# Patient Record
Sex: Female | Born: 1935 | Race: White | Hispanic: No | Smoking: Never smoker
Health system: Southern US, Community
[De-identification: ages and names within clinical notes are randomized; demographics above are authoritative.]

## PROBLEM LIST (undated history)

## (undated) DIAGNOSIS — K439 Ventral hernia without obstruction or gangrene: Secondary | ICD-10-CM

## (undated) DIAGNOSIS — H3530 Unspecified macular degeneration: Secondary | ICD-10-CM

## (undated) DIAGNOSIS — K219 Gastro-esophageal reflux disease without esophagitis: Secondary | ICD-10-CM

## (undated) DIAGNOSIS — M069 Rheumatoid arthritis, unspecified: Secondary | ICD-10-CM

## (undated) DIAGNOSIS — G629 Polyneuropathy, unspecified: Secondary | ICD-10-CM

## (undated) DIAGNOSIS — M24459 Recurrent dislocation, unspecified hip: Secondary | ICD-10-CM

## (undated) DIAGNOSIS — M359 Systemic involvement of connective tissue, unspecified: Secondary | ICD-10-CM

## (undated) DIAGNOSIS — Z8719 Personal history of other diseases of the digestive system: Secondary | ICD-10-CM

## (undated) DIAGNOSIS — M48061 Spinal stenosis, lumbar region without neurogenic claudication: Secondary | ICD-10-CM

## (undated) DIAGNOSIS — G20A1 Parkinson's disease without dyskinesia, without mention of fluctuations: Secondary | ICD-10-CM

## (undated) DIAGNOSIS — C801 Malignant (primary) neoplasm, unspecified: Secondary | ICD-10-CM

## (undated) DIAGNOSIS — K760 Fatty (change of) liver, not elsewhere classified: Secondary | ICD-10-CM

## (undated) DIAGNOSIS — Z789 Other specified health status: Secondary | ICD-10-CM

## (undated) DIAGNOSIS — C50919 Malignant neoplasm of unspecified site of unspecified female breast: Secondary | ICD-10-CM

## (undated) DIAGNOSIS — N183 Chronic kidney disease, stage 3 unspecified: Secondary | ICD-10-CM

## (undated) DIAGNOSIS — M109 Gout, unspecified: Secondary | ICD-10-CM

## (undated) DIAGNOSIS — I10 Essential (primary) hypertension: Secondary | ICD-10-CM

## (undated) DIAGNOSIS — I5189 Other ill-defined heart diseases: Secondary | ICD-10-CM

## (undated) DIAGNOSIS — I700 Atherosclerosis of aorta: Secondary | ICD-10-CM

## (undated) DIAGNOSIS — I6789 Other cerebrovascular disease: Secondary | ICD-10-CM

## (undated) DIAGNOSIS — E785 Hyperlipidemia, unspecified: Secondary | ICD-10-CM

## (undated) DIAGNOSIS — F32A Depression, unspecified: Secondary | ICD-10-CM

## (undated) DIAGNOSIS — G20 Parkinson's disease: Secondary | ICD-10-CM

## (undated) DIAGNOSIS — E538 Deficiency of other specified B group vitamins: Secondary | ICD-10-CM

## (undated) DIAGNOSIS — Z96643 Presence of artificial hip joint, bilateral: Secondary | ICD-10-CM

## (undated) DIAGNOSIS — H9190 Unspecified hearing loss, unspecified ear: Secondary | ICD-10-CM

## (undated) DIAGNOSIS — M549 Dorsalgia, unspecified: Secondary | ICD-10-CM

## (undated) DIAGNOSIS — Z923 Personal history of irradiation: Secondary | ICD-10-CM

## (undated) DIAGNOSIS — H409 Unspecified glaucoma: Secondary | ICD-10-CM

## (undated) HISTORY — PX: CATARACT EXTRACTION: SUR2

## (undated) HISTORY — DX: Gout, unspecified: M10.9

## (undated) HISTORY — PX: JOINT REPLACEMENT: SHX530

## (undated) HISTORY — DX: Parkinson's disease without dyskinesia, without mention of fluctuations: G20.A1

## (undated) HISTORY — DX: Dorsalgia, unspecified: M54.9

## (undated) HISTORY — PX: EYE SURGERY: SHX253

## (undated) HISTORY — PX: HAND SURGERY: SHX662

## (undated) HISTORY — PX: ABDOMINAL HYSTERECTOMY: SHX81

## (undated) HISTORY — PX: TOTAL KNEE ARTHROPLASTY: SHX125

## (undated) HISTORY — DX: Essential (primary) hypertension: I10

## (undated) HISTORY — DX: Parkinson's disease: G20

## (undated) HISTORY — DX: Unspecified hearing loss, unspecified ear: H91.90

## (undated) HISTORY — PX: HIP SURGERY: SHX245

## (undated) HISTORY — PX: OOPHORECTOMY: SHX86

## (undated) HISTORY — DX: Malignant (primary) neoplasm, unspecified: C80.1

---

## 1999-06-09 ENCOUNTER — Other Ambulatory Visit: Admission: RE | Admit: 1999-06-09 | Discharge: 1999-06-09 | Payer: Self-pay

## 2004-06-30 ENCOUNTER — Other Ambulatory Visit: Payer: Self-pay

## 2004-07-22 ENCOUNTER — Ambulatory Visit: Payer: Self-pay | Admitting: Pain Medicine

## 2004-07-28 ENCOUNTER — Inpatient Hospital Stay: Payer: Self-pay | Admitting: Unknown Physician Specialty

## 2004-07-28 ENCOUNTER — Other Ambulatory Visit: Payer: Self-pay

## 2004-08-19 ENCOUNTER — Other Ambulatory Visit: Payer: Self-pay

## 2004-09-02 ENCOUNTER — Inpatient Hospital Stay: Payer: Self-pay | Admitting: General Practice

## 2004-09-06 ENCOUNTER — Encounter: Payer: Self-pay | Admitting: Internal Medicine

## 2004-09-17 ENCOUNTER — Encounter: Payer: Self-pay | Admitting: Internal Medicine

## 2004-10-18 ENCOUNTER — Encounter: Payer: Self-pay | Admitting: Internal Medicine

## 2004-10-26 ENCOUNTER — Ambulatory Visit: Payer: Self-pay | Admitting: Internal Medicine

## 2004-11-10 ENCOUNTER — Ambulatory Visit: Payer: Self-pay | Admitting: Pain Medicine

## 2004-11-16 ENCOUNTER — Ambulatory Visit: Payer: Self-pay | Admitting: Pain Medicine

## 2004-11-18 ENCOUNTER — Encounter: Payer: Self-pay | Admitting: Internal Medicine

## 2004-12-16 ENCOUNTER — Encounter: Payer: Self-pay | Admitting: Internal Medicine

## 2005-01-12 ENCOUNTER — Ambulatory Visit: Payer: Self-pay | Admitting: Pain Medicine

## 2005-01-16 ENCOUNTER — Encounter: Payer: Self-pay | Admitting: Internal Medicine

## 2005-01-20 ENCOUNTER — Ambulatory Visit: Payer: Self-pay | Admitting: Pain Medicine

## 2005-02-08 ENCOUNTER — Ambulatory Visit: Payer: Self-pay | Admitting: Pain Medicine

## 2005-02-15 ENCOUNTER — Encounter: Payer: Self-pay | Admitting: Internal Medicine

## 2005-03-18 ENCOUNTER — Encounter: Payer: Self-pay | Admitting: Internal Medicine

## 2005-03-30 ENCOUNTER — Ambulatory Visit: Payer: Self-pay | Admitting: Gastroenterology

## 2005-03-31 ENCOUNTER — Ambulatory Visit: Payer: Self-pay | Admitting: Gastroenterology

## 2005-04-08 ENCOUNTER — Ambulatory Visit: Payer: Self-pay | Admitting: Pain Medicine

## 2005-04-14 ENCOUNTER — Ambulatory Visit: Payer: Self-pay | Admitting: Pain Medicine

## 2005-05-04 ENCOUNTER — Ambulatory Visit: Payer: Self-pay | Admitting: Pain Medicine

## 2005-05-24 ENCOUNTER — Ambulatory Visit: Payer: Self-pay | Admitting: Pain Medicine

## 2005-06-03 ENCOUNTER — Ambulatory Visit: Payer: Self-pay | Admitting: Pain Medicine

## 2005-06-07 ENCOUNTER — Ambulatory Visit: Payer: Self-pay | Admitting: Pain Medicine

## 2005-06-09 ENCOUNTER — Ambulatory Visit: Payer: Self-pay | Admitting: Pain Medicine

## 2005-07-13 ENCOUNTER — Ambulatory Visit: Payer: Self-pay | Admitting: Pain Medicine

## 2005-07-21 ENCOUNTER — Ambulatory Visit: Payer: Self-pay | Admitting: Pain Medicine

## 2005-08-17 ENCOUNTER — Ambulatory Visit: Payer: Self-pay | Admitting: Pain Medicine

## 2005-08-25 ENCOUNTER — Ambulatory Visit: Payer: Self-pay | Admitting: Unknown Physician Specialty

## 2005-09-16 ENCOUNTER — Ambulatory Visit: Payer: Self-pay | Admitting: Pain Medicine

## 2005-10-06 ENCOUNTER — Ambulatory Visit: Payer: Self-pay | Admitting: Pain Medicine

## 2005-10-18 DIAGNOSIS — Z853 Personal history of malignant neoplasm of breast: Secondary | ICD-10-CM | POA: Insufficient documentation

## 2005-10-18 DIAGNOSIS — Z923 Personal history of irradiation: Secondary | ICD-10-CM

## 2005-10-18 DIAGNOSIS — C50911 Malignant neoplasm of unspecified site of right female breast: Secondary | ICD-10-CM

## 2005-10-18 DIAGNOSIS — C801 Malignant (primary) neoplasm, unspecified: Secondary | ICD-10-CM

## 2005-10-18 DIAGNOSIS — C50919 Malignant neoplasm of unspecified site of unspecified female breast: Secondary | ICD-10-CM

## 2005-10-18 HISTORY — DX: Malignant (primary) neoplasm, unspecified: C80.1

## 2005-10-18 HISTORY — PX: BREAST LUMPECTOMY: SHX2

## 2005-10-18 HISTORY — DX: Personal history of irradiation: Z92.3

## 2005-10-18 HISTORY — DX: Malignant neoplasm of unspecified site of right female breast: C50.911

## 2005-10-18 HISTORY — DX: Malignant neoplasm of unspecified site of unspecified female breast: C50.919

## 2005-10-21 ENCOUNTER — Ambulatory Visit: Payer: Self-pay | Admitting: Pain Medicine

## 2005-11-03 ENCOUNTER — Ambulatory Visit: Payer: Self-pay | Admitting: Pain Medicine

## 2005-11-30 ENCOUNTER — Ambulatory Visit: Payer: Self-pay | Admitting: Pain Medicine

## 2005-12-23 ENCOUNTER — Ambulatory Visit: Payer: Self-pay | Admitting: Pain Medicine

## 2006-01-18 ENCOUNTER — Ambulatory Visit: Payer: Self-pay | Admitting: Pain Medicine

## 2006-02-10 ENCOUNTER — Ambulatory Visit: Payer: Self-pay | Admitting: Internal Medicine

## 2006-02-15 ENCOUNTER — Ambulatory Visit: Payer: Self-pay | Admitting: Pain Medicine

## 2006-02-24 ENCOUNTER — Ambulatory Visit: Payer: Self-pay | Admitting: Internal Medicine

## 2006-03-03 ENCOUNTER — Ambulatory Visit: Payer: Self-pay | Admitting: Surgery

## 2006-03-16 ENCOUNTER — Ambulatory Visit: Payer: Self-pay | Admitting: Surgery

## 2006-03-24 ENCOUNTER — Ambulatory Visit: Payer: Self-pay | Admitting: Pain Medicine

## 2006-04-01 ENCOUNTER — Ambulatory Visit: Payer: Self-pay | Admitting: Oncology

## 2006-04-19 ENCOUNTER — Ambulatory Visit: Payer: Self-pay | Admitting: Pain Medicine

## 2006-04-25 ENCOUNTER — Ambulatory Visit: Payer: Self-pay | Admitting: Oncology

## 2006-05-12 ENCOUNTER — Ambulatory Visit: Payer: Self-pay | Admitting: Pain Medicine

## 2006-05-18 ENCOUNTER — Ambulatory Visit: Payer: Self-pay | Admitting: Oncology

## 2006-05-18 ENCOUNTER — Ambulatory Visit: Payer: Self-pay | Admitting: Pain Medicine

## 2006-06-14 ENCOUNTER — Ambulatory Visit: Payer: Self-pay | Admitting: Pain Medicine

## 2006-06-21 ENCOUNTER — Ambulatory Visit: Payer: Self-pay | Admitting: Oncology

## 2006-07-12 ENCOUNTER — Ambulatory Visit: Payer: Self-pay | Admitting: Pain Medicine

## 2006-07-18 ENCOUNTER — Ambulatory Visit: Payer: Self-pay | Admitting: Oncology

## 2006-08-16 ENCOUNTER — Ambulatory Visit: Payer: Self-pay | Admitting: Pain Medicine

## 2006-08-24 ENCOUNTER — Ambulatory Visit: Payer: Self-pay | Admitting: Pain Medicine

## 2006-09-15 ENCOUNTER — Ambulatory Visit: Payer: Self-pay | Admitting: Pain Medicine

## 2006-09-21 ENCOUNTER — Ambulatory Visit: Payer: Self-pay | Admitting: Pain Medicine

## 2006-09-28 ENCOUNTER — Ambulatory Visit: Payer: Self-pay | Admitting: Pain Medicine

## 2006-10-20 ENCOUNTER — Other Ambulatory Visit: Payer: Self-pay

## 2006-10-26 ENCOUNTER — Inpatient Hospital Stay: Payer: Self-pay | Admitting: General Practice

## 2006-11-14 ENCOUNTER — Encounter: Payer: Self-pay | Admitting: General Practice

## 2006-11-18 ENCOUNTER — Encounter: Payer: Self-pay | Admitting: General Practice

## 2006-11-25 ENCOUNTER — Ambulatory Visit: Payer: Self-pay | Admitting: Oncology

## 2006-12-15 ENCOUNTER — Ambulatory Visit: Payer: Self-pay | Admitting: Pain Medicine

## 2006-12-17 ENCOUNTER — Encounter: Payer: Self-pay | Admitting: General Practice

## 2007-01-16 ENCOUNTER — Ambulatory Visit: Payer: Self-pay | Admitting: Pain Medicine

## 2007-02-22 ENCOUNTER — Ambulatory Visit: Payer: Self-pay | Admitting: Oncology

## 2007-03-21 ENCOUNTER — Ambulatory Visit: Payer: Self-pay | Admitting: Pain Medicine

## 2007-03-29 ENCOUNTER — Ambulatory Visit: Payer: Self-pay | Admitting: Pain Medicine

## 2007-04-20 ENCOUNTER — Ambulatory Visit: Payer: Self-pay | Admitting: Pain Medicine

## 2007-05-18 ENCOUNTER — Ambulatory Visit: Payer: Self-pay | Admitting: Pain Medicine

## 2007-05-19 ENCOUNTER — Ambulatory Visit: Payer: Self-pay | Admitting: Oncology

## 2007-05-22 ENCOUNTER — Ambulatory Visit: Payer: Self-pay | Admitting: Pain Medicine

## 2007-05-26 ENCOUNTER — Ambulatory Visit: Payer: Self-pay | Admitting: Oncology

## 2007-06-13 ENCOUNTER — Ambulatory Visit: Payer: Self-pay | Admitting: Pain Medicine

## 2007-06-19 ENCOUNTER — Ambulatory Visit: Payer: Self-pay | Admitting: Oncology

## 2007-07-19 ENCOUNTER — Ambulatory Visit: Payer: Self-pay | Admitting: Radiation Oncology

## 2007-08-21 ENCOUNTER — Ambulatory Visit: Payer: Self-pay | Admitting: Pain Medicine

## 2007-08-28 ENCOUNTER — Ambulatory Visit: Payer: Self-pay | Admitting: Pain Medicine

## 2007-09-01 ENCOUNTER — Ambulatory Visit: Payer: Self-pay | Admitting: Pain Medicine

## 2007-09-18 ENCOUNTER — Ambulatory Visit: Payer: Self-pay | Admitting: Pain Medicine

## 2007-10-17 ENCOUNTER — Ambulatory Visit: Payer: Self-pay | Admitting: Pain Medicine

## 2007-11-19 ENCOUNTER — Ambulatory Visit: Payer: Self-pay | Admitting: Oncology

## 2007-11-24 ENCOUNTER — Ambulatory Visit: Payer: Self-pay | Admitting: Oncology

## 2007-11-28 ENCOUNTER — Ambulatory Visit: Payer: Self-pay | Admitting: Pain Medicine

## 2007-12-17 ENCOUNTER — Ambulatory Visit: Payer: Self-pay | Admitting: Oncology

## 2007-12-26 ENCOUNTER — Ambulatory Visit: Payer: Self-pay | Admitting: Pain Medicine

## 2008-01-25 ENCOUNTER — Ambulatory Visit: Payer: Self-pay | Admitting: Pain Medicine

## 2008-02-26 ENCOUNTER — Ambulatory Visit: Payer: Self-pay | Admitting: Oncology

## 2008-02-28 ENCOUNTER — Ambulatory Visit: Payer: Self-pay | Admitting: Pain Medicine

## 2008-03-18 ENCOUNTER — Ambulatory Visit: Payer: Self-pay | Admitting: Oncology

## 2008-04-04 ENCOUNTER — Ambulatory Visit: Payer: Self-pay | Admitting: Pain Medicine

## 2008-04-10 ENCOUNTER — Ambulatory Visit: Payer: Self-pay | Admitting: Pain Medicine

## 2008-05-08 ENCOUNTER — Encounter: Payer: Self-pay | Admitting: Neurology

## 2008-05-09 ENCOUNTER — Ambulatory Visit: Payer: Self-pay | Admitting: Pain Medicine

## 2008-05-21 ENCOUNTER — Encounter: Payer: Self-pay | Admitting: Neurology

## 2008-05-29 ENCOUNTER — Other Ambulatory Visit: Payer: Self-pay

## 2008-05-29 ENCOUNTER — Ambulatory Visit: Payer: Self-pay | Admitting: Ophthalmology

## 2008-06-06 ENCOUNTER — Ambulatory Visit: Payer: Self-pay | Admitting: Pain Medicine

## 2008-06-11 ENCOUNTER — Ambulatory Visit: Payer: Self-pay | Admitting: Ophthalmology

## 2008-06-18 ENCOUNTER — Encounter: Payer: Self-pay | Admitting: Neurology

## 2008-07-04 ENCOUNTER — Ambulatory Visit: Payer: Self-pay | Admitting: Pain Medicine

## 2008-07-04 ENCOUNTER — Ambulatory Visit: Payer: Self-pay | Admitting: Ophthalmology

## 2008-07-09 ENCOUNTER — Ambulatory Visit: Payer: Self-pay | Admitting: Ophthalmology

## 2008-07-18 ENCOUNTER — Ambulatory Visit: Payer: Self-pay | Admitting: Oncology

## 2008-08-08 ENCOUNTER — Ambulatory Visit: Payer: Self-pay | Admitting: Pain Medicine

## 2008-08-13 ENCOUNTER — Inpatient Hospital Stay: Payer: Self-pay | Admitting: Specialist

## 2008-09-03 ENCOUNTER — Ambulatory Visit: Payer: Self-pay | Admitting: Pain Medicine

## 2008-09-26 ENCOUNTER — Ambulatory Visit: Payer: Self-pay | Admitting: Pain Medicine

## 2008-10-31 ENCOUNTER — Ambulatory Visit: Payer: Self-pay | Admitting: Pain Medicine

## 2008-11-28 ENCOUNTER — Ambulatory Visit: Payer: Self-pay | Admitting: Pain Medicine

## 2008-12-26 ENCOUNTER — Ambulatory Visit: Payer: Self-pay | Admitting: Pain Medicine

## 2009-01-30 ENCOUNTER — Ambulatory Visit: Payer: Self-pay | Admitting: Pain Medicine

## 2009-02-20 ENCOUNTER — Ambulatory Visit: Payer: Self-pay | Admitting: Pain Medicine

## 2009-02-26 ENCOUNTER — Ambulatory Visit: Payer: Self-pay | Admitting: Pain Medicine

## 2009-03-05 ENCOUNTER — Emergency Department: Payer: Self-pay | Admitting: Emergency Medicine

## 2009-03-18 ENCOUNTER — Ambulatory Visit: Payer: Self-pay | Admitting: Oncology

## 2009-03-27 ENCOUNTER — Ambulatory Visit: Payer: Self-pay | Admitting: Pain Medicine

## 2009-04-04 ENCOUNTER — Ambulatory Visit: Payer: Self-pay | Admitting: Oncology

## 2009-04-17 ENCOUNTER — Ambulatory Visit: Payer: Self-pay | Admitting: Oncology

## 2009-04-24 ENCOUNTER — Ambulatory Visit: Payer: Self-pay | Admitting: Pain Medicine

## 2009-05-05 ENCOUNTER — Ambulatory Visit: Payer: Self-pay | Admitting: Gastroenterology

## 2009-05-15 ENCOUNTER — Inpatient Hospital Stay: Payer: Self-pay | Admitting: General Practice

## 2009-05-22 ENCOUNTER — Ambulatory Visit: Payer: Self-pay | Admitting: Pain Medicine

## 2009-06-12 ENCOUNTER — Ambulatory Visit: Payer: Self-pay | Admitting: General Practice

## 2009-06-24 ENCOUNTER — Ambulatory Visit: Payer: Self-pay | Admitting: Pain Medicine

## 2009-06-25 ENCOUNTER — Inpatient Hospital Stay: Payer: Self-pay | Admitting: General Practice

## 2009-07-24 ENCOUNTER — Ambulatory Visit: Payer: Self-pay | Admitting: Pain Medicine

## 2009-08-21 ENCOUNTER — Ambulatory Visit: Payer: Self-pay | Admitting: Pain Medicine

## 2009-09-23 ENCOUNTER — Ambulatory Visit: Payer: Self-pay | Admitting: Pain Medicine

## 2009-11-03 ENCOUNTER — Ambulatory Visit: Payer: Self-pay | Admitting: Pain Medicine

## 2009-11-27 ENCOUNTER — Ambulatory Visit: Payer: Self-pay | Admitting: Pain Medicine

## 2010-01-13 ENCOUNTER — Ambulatory Visit: Payer: Self-pay | Admitting: Pain Medicine

## 2010-01-27 ENCOUNTER — Ambulatory Visit: Payer: Self-pay | Admitting: Pain Medicine

## 2010-03-03 ENCOUNTER — Ambulatory Visit: Payer: Self-pay | Admitting: Pain Medicine

## 2010-03-18 ENCOUNTER — Ambulatory Visit: Payer: Self-pay | Admitting: Pain Medicine

## 2010-04-02 ENCOUNTER — Ambulatory Visit: Payer: Self-pay | Admitting: Pain Medicine

## 2010-04-23 ENCOUNTER — Ambulatory Visit: Payer: Self-pay | Admitting: Internal Medicine

## 2010-04-30 ENCOUNTER — Ambulatory Visit: Payer: Self-pay | Admitting: Pain Medicine

## 2010-06-01 ENCOUNTER — Ambulatory Visit: Payer: Self-pay | Admitting: Pain Medicine

## 2010-06-04 ENCOUNTER — Ambulatory Visit: Payer: Self-pay | Admitting: Oncology

## 2010-06-18 ENCOUNTER — Ambulatory Visit: Payer: Self-pay | Admitting: Oncology

## 2010-06-30 ENCOUNTER — Ambulatory Visit: Payer: Self-pay | Admitting: Pain Medicine

## 2010-07-30 ENCOUNTER — Ambulatory Visit: Payer: Self-pay | Admitting: Pain Medicine

## 2010-08-27 ENCOUNTER — Ambulatory Visit: Payer: Self-pay | Admitting: Pain Medicine

## 2010-09-23 ENCOUNTER — Ambulatory Visit: Payer: Self-pay | Admitting: Pain Medicine

## 2010-10-27 ENCOUNTER — Ambulatory Visit: Payer: Self-pay | Admitting: Pain Medicine

## 2010-11-24 ENCOUNTER — Ambulatory Visit: Payer: Self-pay | Admitting: Pain Medicine

## 2010-11-30 ENCOUNTER — Ambulatory Visit: Payer: Self-pay | Admitting: Pain Medicine

## 2010-12-22 ENCOUNTER — Ambulatory Visit: Payer: Self-pay | Admitting: Pain Medicine

## 2011-01-21 ENCOUNTER — Ambulatory Visit: Payer: Self-pay | Admitting: Pain Medicine

## 2011-03-03 ENCOUNTER — Ambulatory Visit: Payer: Self-pay | Admitting: Pain Medicine

## 2011-04-06 ENCOUNTER — Ambulatory Visit: Payer: Self-pay | Admitting: Pain Medicine

## 2011-04-27 ENCOUNTER — Ambulatory Visit: Payer: Self-pay | Admitting: Internal Medicine

## 2011-05-06 ENCOUNTER — Ambulatory Visit: Payer: Self-pay | Admitting: Pain Medicine

## 2011-05-10 ENCOUNTER — Ambulatory Visit: Payer: Self-pay | Admitting: Pain Medicine

## 2011-06-01 ENCOUNTER — Ambulatory Visit: Payer: Self-pay | Admitting: Pain Medicine

## 2011-06-07 ENCOUNTER — Ambulatory Visit: Payer: Self-pay | Admitting: Oncology

## 2011-06-19 ENCOUNTER — Ambulatory Visit: Payer: Self-pay | Admitting: Oncology

## 2011-06-29 ENCOUNTER — Ambulatory Visit: Payer: Self-pay | Admitting: Pain Medicine

## 2011-07-07 DIAGNOSIS — M064 Inflammatory polyarthropathy: Secondary | ICD-10-CM | POA: Insufficient documentation

## 2011-07-29 ENCOUNTER — Ambulatory Visit: Payer: Self-pay | Admitting: Pain Medicine

## 2011-09-01 ENCOUNTER — Ambulatory Visit: Payer: Self-pay | Admitting: Pain Medicine

## 2011-09-27 DIAGNOSIS — K760 Fatty (change of) liver, not elsewhere classified: Secondary | ICD-10-CM | POA: Insufficient documentation

## 2011-09-28 ENCOUNTER — Ambulatory Visit: Payer: Self-pay | Admitting: Pain Medicine

## 2011-10-28 ENCOUNTER — Ambulatory Visit: Payer: Self-pay | Admitting: Pain Medicine

## 2011-12-02 ENCOUNTER — Ambulatory Visit: Payer: Self-pay | Admitting: Pain Medicine

## 2011-12-06 ENCOUNTER — Ambulatory Visit: Payer: Self-pay | Admitting: Pain Medicine

## 2011-12-28 ENCOUNTER — Ambulatory Visit: Payer: Self-pay | Admitting: Pain Medicine

## 2012-01-20 ENCOUNTER — Ambulatory Visit: Payer: Self-pay | Admitting: Pain Medicine

## 2012-02-22 ENCOUNTER — Ambulatory Visit: Payer: Self-pay | Admitting: Pain Medicine

## 2012-04-03 DIAGNOSIS — I38 Endocarditis, valve unspecified: Secondary | ICD-10-CM | POA: Insufficient documentation

## 2012-04-06 ENCOUNTER — Ambulatory Visit: Payer: Self-pay | Admitting: Pain Medicine

## 2012-04-19 ENCOUNTER — Ambulatory Visit: Payer: Self-pay | Admitting: Pain Medicine

## 2012-05-03 ENCOUNTER — Ambulatory Visit: Payer: Self-pay | Admitting: Pain Medicine

## 2012-05-04 ENCOUNTER — Ambulatory Visit: Payer: Self-pay | Admitting: Internal Medicine

## 2012-06-01 ENCOUNTER — Ambulatory Visit: Payer: Self-pay | Admitting: Pain Medicine

## 2012-07-04 ENCOUNTER — Ambulatory Visit: Payer: Self-pay | Admitting: Pain Medicine

## 2012-07-05 ENCOUNTER — Ambulatory Visit: Payer: Self-pay | Admitting: Pain Medicine

## 2012-08-03 ENCOUNTER — Ambulatory Visit: Payer: Self-pay | Admitting: Pain Medicine

## 2012-08-16 ENCOUNTER — Ambulatory Visit: Payer: Self-pay | Admitting: Pain Medicine

## 2012-08-31 ENCOUNTER — Ambulatory Visit: Payer: Self-pay | Admitting: Pain Medicine

## 2012-10-03 ENCOUNTER — Ambulatory Visit: Payer: Self-pay | Admitting: Pain Medicine

## 2012-10-25 ENCOUNTER — Ambulatory Visit: Payer: Self-pay | Admitting: Pain Medicine

## 2012-10-31 ENCOUNTER — Ambulatory Visit: Payer: Self-pay | Admitting: Pain Medicine

## 2012-11-02 ENCOUNTER — Ambulatory Visit: Payer: Self-pay | Admitting: Pain Medicine

## 2012-11-22 ENCOUNTER — Ambulatory Visit: Payer: Self-pay | Admitting: Pain Medicine

## 2012-12-28 ENCOUNTER — Ambulatory Visit: Payer: Self-pay | Admitting: Pain Medicine

## 2013-01-31 ENCOUNTER — Ambulatory Visit: Payer: Self-pay | Admitting: Pain Medicine

## 2013-02-14 ENCOUNTER — Ambulatory Visit: Payer: Self-pay | Admitting: Pain Medicine

## 2013-02-27 ENCOUNTER — Ambulatory Visit: Payer: Self-pay | Admitting: Pain Medicine

## 2013-03-27 ENCOUNTER — Ambulatory Visit: Payer: Self-pay | Admitting: Pain Medicine

## 2013-04-02 ENCOUNTER — Ambulatory Visit: Payer: Self-pay | Admitting: Pain Medicine

## 2013-04-12 ENCOUNTER — Emergency Department: Payer: Self-pay | Admitting: Unknown Physician Specialty

## 2013-04-12 LAB — CBC
HCT: 38.9 % (ref 35.0–47.0)
HGB: 12.9 g/dL (ref 12.0–16.0)
MCV: 100 fL (ref 80–100)
Platelet: 289 10*3/uL (ref 150–440)
RBC: 3.89 10*6/uL (ref 3.80–5.20)
RDW: 14.4 % (ref 11.5–14.5)

## 2013-04-12 LAB — BASIC METABOLIC PANEL
BUN: 42 mg/dL — ABNORMAL HIGH (ref 7–18)
Calcium, Total: 9.4 mg/dL (ref 8.5–10.1)
Co2: 31 mmol/L (ref 21–32)
EGFR (African American): >60
EGFR (Non-African Amer.): 53 — ABNORMAL LOW
Potassium: 4.5 mmol/L (ref 3.5–5.1)
Sodium: 137 mmol/L (ref 136–145)

## 2013-04-12 LAB — SEDIMENTATION RATE: Erythrocyte Sed Rate: 21 mm/hr (ref 0–30)

## 2013-04-15 ENCOUNTER — Emergency Department: Payer: Self-pay | Admitting: Emergency Medicine

## 2013-04-26 ENCOUNTER — Ambulatory Visit: Payer: Self-pay | Admitting: Pain Medicine

## 2013-04-30 ENCOUNTER — Ambulatory Visit: Payer: Self-pay | Admitting: Pain Medicine

## 2013-05-07 ENCOUNTER — Ambulatory Visit: Payer: Self-pay | Admitting: Internal Medicine

## 2013-05-25 ENCOUNTER — Ambulatory Visit: Payer: Self-pay | Admitting: Neurology

## 2013-05-29 ENCOUNTER — Ambulatory Visit: Payer: Self-pay | Admitting: Pain Medicine

## 2013-06-28 ENCOUNTER — Ambulatory Visit: Payer: Self-pay | Admitting: Pain Medicine

## 2013-07-25 ENCOUNTER — Ambulatory Visit: Payer: Self-pay | Admitting: Pain Medicine

## 2013-08-23 ENCOUNTER — Ambulatory Visit: Payer: Self-pay | Admitting: Pain Medicine

## 2013-09-03 ENCOUNTER — Ambulatory Visit: Payer: Self-pay | Admitting: Pain Medicine

## 2013-09-27 ENCOUNTER — Ambulatory Visit: Payer: Self-pay | Admitting: Pain Medicine

## 2013-11-07 ENCOUNTER — Ambulatory Visit: Payer: Self-pay | Admitting: Pain Medicine

## 2013-12-11 ENCOUNTER — Ambulatory Visit: Payer: Self-pay | Admitting: Pain Medicine

## 2014-01-08 ENCOUNTER — Ambulatory Visit: Payer: Self-pay | Admitting: Pain Medicine

## 2014-01-24 ENCOUNTER — Emergency Department: Payer: Self-pay | Admitting: Emergency Medicine

## 2014-01-24 LAB — CBC
HCT: 38.7 % (ref 35.0–47.0)
HGB: 12.9 g/dL (ref 12.0–16.0)
MCH: 32.8 pg (ref 26.0–34.0)
MCHC: 33.3 g/dL (ref 32.0–36.0)
MCV: 99 fL (ref 80–100)
Platelet: 198 10*3/uL (ref 150–440)
RBC: 3.92 10*6/uL (ref 3.80–5.20)
RDW: 13.5 % (ref 11.5–14.5)
WBC: 7 10*3/uL (ref 3.6–11.0)

## 2014-01-24 LAB — BASIC METABOLIC PANEL
ANION GAP: 5 — AB (ref 7–16)
BUN: 25 mg/dL — AB (ref 7–18)
CHLORIDE: 104 mmol/L (ref 98–107)
CO2: 30 mmol/L (ref 21–32)
CREATININE: 1.03 mg/dL (ref 0.60–1.30)
Calcium, Total: 8.4 mg/dL — ABNORMAL LOW (ref 8.5–10.1)
EGFR (Non-African Amer.): 53 — ABNORMAL LOW
Glucose: 98 mg/dL (ref 65–99)
Osmolality: 282 (ref 275–301)
Potassium: 3.9 mmol/L (ref 3.5–5.1)
Sodium: 139 mmol/L (ref 136–145)

## 2014-01-24 LAB — PROTIME-INR
INR: 1
PROTHROMBIN TIME: 13.2 s (ref 11.5–14.7)

## 2014-02-07 ENCOUNTER — Ambulatory Visit: Payer: Self-pay | Admitting: Pain Medicine

## 2014-03-07 ENCOUNTER — Ambulatory Visit: Payer: Self-pay | Admitting: Pain Medicine

## 2014-04-09 ENCOUNTER — Ambulatory Visit: Payer: Self-pay | Admitting: Pain Medicine

## 2014-04-17 ENCOUNTER — Ambulatory Visit: Payer: Self-pay | Admitting: Pain Medicine

## 2014-05-09 ENCOUNTER — Ambulatory Visit: Payer: Self-pay | Admitting: Pain Medicine

## 2014-05-09 ENCOUNTER — Ambulatory Visit: Payer: Self-pay | Admitting: Internal Medicine

## 2014-06-06 ENCOUNTER — Ambulatory Visit: Payer: Self-pay | Admitting: Pain Medicine

## 2014-07-09 ENCOUNTER — Ambulatory Visit: Payer: Self-pay | Admitting: Pain Medicine

## 2014-08-13 ENCOUNTER — Ambulatory Visit: Payer: Self-pay | Admitting: Pain Medicine

## 2014-08-15 ENCOUNTER — Ambulatory Visit: Payer: Self-pay | Admitting: Rheumatology

## 2014-08-15 LAB — BODY FLUID CELL COUNT WITH DIFFERENTIAL
BASOS ABS: 0 %
Eosinophil: 0 %
Lymphocytes: 11 %
NEUTROS PCT: 89 %
NUCLEATED CELL COUNT: 60592 /mm3
OTHER CELLS BF: 0 %
Other Mononuclear Cells: 0 %

## 2014-08-15 LAB — SYNOVIAL FLUID, CRYSTAL

## 2014-09-05 ENCOUNTER — Ambulatory Visit: Payer: Self-pay | Admitting: Pain Medicine

## 2014-09-30 ENCOUNTER — Emergency Department: Payer: Self-pay | Admitting: Emergency Medicine

## 2014-09-30 LAB — COMPREHENSIVE METABOLIC PANEL
ALBUMIN: 3.3 g/dL — AB (ref 3.4–5.0)
ALK PHOS: 137 U/L — AB
AST: 22 U/L (ref 15–37)
Anion Gap: 6 — ABNORMAL LOW (ref 7–16)
BUN: 28 mg/dL — AB (ref 7–18)
Bilirubin,Total: 0.2 mg/dL (ref 0.2–1.0)
CALCIUM: 9.1 mg/dL (ref 8.5–10.1)
CREATININE: 0.98 mg/dL (ref 0.60–1.30)
Chloride: 103 mmol/L (ref 98–107)
Co2: 31 mmol/L (ref 21–32)
GFR CALC NON AF AMER: 58 — AB
Glucose: 98 mg/dL (ref 65–99)
Osmolality: 285 (ref 275–301)
POTASSIUM: 3.9 mmol/L (ref 3.5–5.1)
SGPT (ALT): 84 U/L — ABNORMAL HIGH
Sodium: 140 mmol/L (ref 136–145)
Total Protein: 7.2 g/dL (ref 6.4–8.2)

## 2014-09-30 LAB — URINALYSIS, COMPLETE
Bilirubin,UR: NEGATIVE
GLUCOSE, UR: NEGATIVE mg/dL (ref 0–75)
KETONE: NEGATIVE
Nitrite: NEGATIVE
Ph: 5 (ref 4.5–8.0)
Protein: NEGATIVE
RBC,UR: 7 /HPF (ref 0–5)
Specific Gravity: 1.006 (ref 1.003–1.030)
Squamous Epithelial: 1

## 2014-09-30 LAB — CBC
HCT: 37.4 % (ref 35.0–47.0)
HGB: 12.1 g/dL (ref 12.0–16.0)
MCH: 32 pg (ref 26.0–34.0)
MCHC: 32.3 g/dL (ref 32.0–36.0)
MCV: 99 fL (ref 80–100)
Platelet: 260 10*3/uL (ref 150–440)
RBC: 3.78 10*6/uL — ABNORMAL LOW (ref 3.80–5.20)
RDW: 15.3 % — ABNORMAL HIGH (ref 11.5–14.5)
WBC: 8.8 10*3/uL (ref 3.6–11.0)

## 2014-10-08 ENCOUNTER — Ambulatory Visit: Payer: Self-pay | Admitting: Pain Medicine

## 2014-10-21 ENCOUNTER — Ambulatory Visit: Payer: Self-pay | Admitting: Pain Medicine

## 2014-11-04 ENCOUNTER — Ambulatory Visit: Payer: Self-pay | Admitting: Pain Medicine

## 2014-11-14 ENCOUNTER — Ambulatory Visit: Payer: Self-pay | Admitting: Pain Medicine

## 2014-12-06 ENCOUNTER — Ambulatory Visit: Payer: Self-pay | Admitting: Gastroenterology

## 2014-12-10 ENCOUNTER — Encounter: Payer: Self-pay | Admitting: Rheumatology

## 2014-12-12 ENCOUNTER — Ambulatory Visit: Payer: Self-pay | Admitting: Pain Medicine

## 2014-12-17 ENCOUNTER — Encounter
Admit: 2014-12-17 | Disposition: A | Discharge status: Home / Self Care | Payer: Self-pay | Attending: Rheumatology | Admitting: Rheumatology

## 2014-12-30 ENCOUNTER — Ambulatory Visit: Payer: Self-pay | Admitting: Specialist

## 2015-01-03 ENCOUNTER — Ambulatory Visit: Payer: Self-pay | Admitting: Specialist

## 2015-01-09 ENCOUNTER — Ambulatory Visit: Payer: Self-pay | Admitting: Pain Medicine

## 2015-01-21 ENCOUNTER — Encounter
Admit: 2015-01-21 | Disposition: A | Discharge status: Home / Self Care | Payer: Self-pay | Attending: Specialist | Admitting: Specialist

## 2015-02-11 ENCOUNTER — Ambulatory Visit
Admit: 2015-02-11 | Disposition: A | Discharge status: Home / Self Care | Payer: Self-pay | Attending: Pain Medicine | Admitting: Pain Medicine

## 2015-02-16 NOTE — Op Note (Signed)
PATIENT NAME:  Yvonne Singleton, Yvonne Singleton MR#:  [id number] DATE OF BIRTH:  [birth year]  DATE OF PROCEDURE:  01/03/2015  PREOPERATIVE DIAGNOSES: 1.  Rheumatoid arthritis with extensive dorsal left wrist synovial hypertrophy.  2.  Rupture extensor pollicis longus tendon.   POSTOPERATIVE DIAGNOSES: 1.  Rheumatoid arthritis with extensive dorsal left wrist synovial hypertrophy.  2.  Rupture extensor pollicis longus tendon.  3.  Rupture common extensor tendon, left index finger. 4.  Rupture extensor carpi radialis brevis tendon.   PROCEDURES: 1.  Complete dorsal left wrist tenosynovectomy.  2.  Side to side repair common index extensor tendon to common extensor tendon long finger.  3.  Transfer of extensor indicis pollicis tendon to extensor pollicis longus.  4.  Transfer of extensor carpi radialis brevis tendon to extensor carpi radialis longus tendon and supplementation with excess tendon from extensor indicis pollicis tendon.   SURGEON: Christophe Louis, M.D.   ANESTHESIA: General.   COMPLICATIONS: None.   TOURNIQUET TIME: Approximately 100 minutes.   DESCRIPTION OF PROCEDURE: After adequate induction of general anesthesia, the left upper extremity is thoroughly prepped with alcohol and ChloraPrep and draped in standard sterile fashion. The extremity is carefully wrapped out with the Esmarch bandage and pneumatic tourniquet elevated to 250 mmHg. A longitudinal curved incision is then made over the dorsum of the wrist, under loupe magnification, and the dissection carefully carried down to the extensor tendons. The dorsal wrist joint is then seen to be extensively infiltrated with multiple areas of hypertrophic synovium. Under loupe magnification, all of this is carefully dissected out. The dissection is then carried distally and the extensor common tendon to the index finger is seen to be ruptured. The ends of the tendon are cleared of excess tenosynovium and then a side to side repair with a  Pulvertaft weave is then performed into the common extensor to the long finger. This is secured with multiple 4-0 Mersilene sutures. The extensor carpi radialis tendon is seen to be ruptured as well. Side to side repair is then performed to the extensor carpi radialis longus tendon using 2-0 Ethibond suture. The extensor pollicis longus tendon end is then dissected out. The proximal end could not be identified and was apparently proximally migrated. Small incision is made over the dorsum of the index finger MP joint and the extensor indicis proprius tendon is then cut and retracted back into the original wound. A Pulvertaft type weave transfer is then performed into the extensor pollicis longus with the thumb extended in the appropriate position. There was seen to be 2 inches of excess extensor indicis proprius tendon and this was used to supplement the side to side repair of the 2 wrist extensor tendons. The wound is thoroughly irrigated multiple times. Dorsal wrist block and median nerve block are performed with plain 0.5% Marcaine. The extensor retinaculum is repaired loosely with 4-0 Mersilene. Several subcutaneous sutures are then placed and the long dorsal wrist extensor wound is closed with the stapler. The wound over the index finger MP joint is closed with 4-0 nylon. A soft bulky dressing is applied with a volar splint keeping the wrist dorsiflexed and the thumb in the abducted and extended position. The tourniquet is released and capillary refill returns to all the fingers. The patient is returned to the recovery room in satisfactory condition having tolerated the procedure quite well.  ____________________________ Lucas Mallow, MD ces:sb D: 01/06/2015 09:13:08 ET T: 01/06/2015 09:25:11 ET JOB#: [id number]  cc: Lucas Mallow, MD, <Dictator> [place]  MD ELECTRONICALLY SIGNED 01/11/2015 13:03

## 2015-02-17 ENCOUNTER — Ambulatory Visit: Payer: Medicare Other | Attending: Rheumatology | Admitting: Occupational Therapy

## 2015-02-17 ENCOUNTER — Encounter: Payer: Self-pay | Admitting: Occupational Therapy

## 2015-02-17 DIAGNOSIS — M06842 Other specified rheumatoid arthritis, left hand: Secondary | ICD-10-CM | POA: Diagnosis not present

## 2015-02-17 DIAGNOSIS — M6281 Muscle weakness (generalized): Secondary | ICD-10-CM | POA: Diagnosis not present

## 2015-02-17 DIAGNOSIS — M66842 Spontaneous rupture of other tendons, left hand: Secondary | ICD-10-CM | POA: Diagnosis not present

## 2015-02-17 DIAGNOSIS — M069 Rheumatoid arthritis, unspecified: Secondary | ICD-10-CM | POA: Insufficient documentation

## 2015-02-17 DIAGNOSIS — M25642 Stiffness of left hand, not elsewhere classified: Secondary | ICD-10-CM

## 2015-02-17 NOTE — Therapy (Signed)
[facility] [address] [place], [address] Phone: [phone]   Fax:  [phone]  Occupational Therapy Treatment  Patient Details  Name: Yvonne Singleton MRN: [id number] Date of Birth: Nov 06, 1934 Referring Provider:  Christophe Louis, MD  Encounter Date: 02/17/2015      OT End of Session - 02/17/15 1228    Visit Number 9   Number of Visits 16   Date for OT Re-Evaluation 03/18/15   Authorization Type Medicare - BCBS   Authorization Time Period 03/18/15   Authorization - Visit Number 9   Authorization - Number of Visits 16   OT Start Time 0945   OT Stop Time 1036   OT Time Calculation (min) 51 min   Activity Tolerance Patient tolerated treatment well;No increased pain   Behavior During Therapy [facility] for tasks assessed/performed      Past Medical History  Diagnosis Date  . Gout   . Parkinson's disease   . Back pain   . Cancer     breast  . Hypertension   . Hard of hearing   . Sleep apnea     Past Surgical History  Procedure Laterality Date  . Hip surgery    . Eye surgery Bilateral   . Cataract extraction Bilateral   . Joint replacement      bilateral hip  . Total knee arthroplasty Bilateral   . Abdominal hysterectomy    . Hand surgery      There were no vitals filed for this visit.  Visit Diagnosis:  Rheumatoid arthritis  Stiffness of hand joint, left      Subjective Assessment - 02/17/15 1207    Subjective  Pt reports no pain left hand/wrist and states that she is wearing her splint at home.   Patient Stated Goals Increase use of left hand for daily activities   Currently in Pain? No/denies                      OT Treatments/Exercises (OP) - 02/17/15 0001    Exercises   Exercises Hand   Hand Exercises   Other Hand Exercises AROM/AAROM of MC flexion with IP extention gentle PROM of 4th and 5th composite flexion AAROM of composite flexion to 2 cm foam  block Then to palm AROM to 2cm foam block AROM in palm tapping of digits off table thumb PA and RA AAROM Blocked AROM of IP of thumb flexion composite flexion to opposition AAROM Opposition to all digits -2 cm then 1 cm foam block - with hand supported in neutral prayer stretch for wrist extention 10 reps Place and hold wrist extention AROM wrist extention over arm rest - AAROM and place and hold wrist exetnion with hand in loose fist holding foam roll - unable to do - Ulnar deviating PROM RD and AROM on table and AAROM    Other Hand Exercises Added Left hand RD exercises/tapping digits 1-5 & performed in clinic today.   LUE Contrast Bath   Time 18 minutes   Splinting   Splinting Added prefab wrist cock up splint for use 1 hr/day  splint use, care, precautions reviewed in clinic and pt verb   Manual Therapy   Manual Therapy Edema management;Joint mobilization;Massage;Passive ROM;Other (comment)  Scar management left wrist. x10 min       Pt was instructed in upgraded HEP to include RD ex's and tapping as well as use of prefab wrist cock up left  hand x1 hr/day. Pt verbalized understanding of this.          OT Education - 02/17/15 1228    Education provided Yes   Education Details Upgraded HEP and splinting   Person(s) Educated Patient   Methods Explanation;Demonstration   Comprehension Verbalized understanding          OT Short Term Goals - 02/17/15 1240    OT SHORT TERM GOAL #1   Title Decreased edema to w/in 2cm DPC, 0.5 PIP contralateral hand in 0-2 weeks   Time 2   Period Weeks   Status On-going   OT SHORT TERM GOAL #2   Title PIP joint extension to 0, no flexion contracture   Time 2   Period Weeks   Status On-going   OT SHORT TERM GOAL #3   Title Passive isolated digital flexionMPs to 50*, PIPs to 60*, DIPs to 20*, active hold exten at 0* all joints in 5 weeks   Time 5   Period Weeks   Status On-going   OT SHORT TERM GOAL #4   Title No rupture or gapping at repair  site, compliance w/ full time orthosis, exchange night and day orthoses, reproduce HEP w/o cues   Time 3   Period Weeks   Status On-going   OT SHORT TERM GOAL #5   Title Iincision scar remodled, radial sensory nerve, dorsal ulnar sensory nerve asymptomatic w/ stretch, tolerate light and deep touch w/ pain <2/10   Time 8   Period Weeks           OT Long Term Goals - 02/17/15 1246    OT LONG TERM GOAL #1   Title Peri-tendonious adhesion remodled w/ no extension lag MP, PIP, DIP; active flexion 75% of contralateral digit, including thumb - allowing light functional got managing hygiene, ADL's (eating, utensils, holding a glass, toothbrush, hairbrush); open hand to grasp object of 4" diameter.   Time 8   Period Weeks   Status On-going   OT LONG TERM GOAL #2   Title Grip to improve to 50% compared to contralateral hand to use in ADL's in 5-8 weeks   Time 8   Period Weeks   Status On-going   OT LONG TERM GOAL #3   Title PRWHE for function improve to at least 15 points in 8 weeks   Time 8   Period Weeks   Status On-going               Plan - 02/17/15 1234    Clinical Impression Statement Pt is progressing nicely with home program and is currently 7 weeks post op. She will benefit from cont therapy to address range of motion, functional use and ADL's Upgraded HEP today to encourage RD and issued pre fab wrist cock-up for use 1 hr per day.   Pt will benefit from skilled therapeutic intervention in order to improve on the following deficits (Retired) Decreased range of motion;Decreased knowledge of precautions;Decreased scar mobility;Decreased strength;Increased edema;Impaired UE functional use;Pain;Impaired flexibility   OT Frequency 2x / week   OT Duration 8 weeks   OT Treatment/Interventions Self-care/ADL training;Ultrasound;Fluidtherapy;Parrafin;Therapeutic exercise;Scar mobilization;Passive range of motion;Therapeutic activities;Therapeutic exercises;Splinting;Patient/family  education;Manual Therapy   Plan Cont out-pt treatment with focus on LTG's and increased functional use left hand. Consider remolding custom splint next 1-2 visits to increase neutral wrist positioning.   Consulted and Agree with Plan of Care Patient        Problem List Patient Active Problem List   Diagnosis Date  Noted  . Rheumatoid arthritis 02/17/2015    Rosalyn Gess 02/17/2015, 12:54 PM  [facility] [address], [place], [address] Phone: [phone]   Fax:  [phone]

## 2015-02-17 NOTE — Patient Instructions (Signed)
Pt was instructed in upgraded HEP to include RD ex's and tapping as well as use of prefab wrist cock up left hand x1 hr/day. Pt verbalized understanding of this.

## 2015-02-19 ENCOUNTER — Ambulatory Visit: Payer: Medicare Other | Admitting: Occupational Therapy

## 2015-02-19 ENCOUNTER — Encounter: Payer: Self-pay | Admitting: Occupational Therapy

## 2015-02-19 DIAGNOSIS — M06842 Other specified rheumatoid arthritis, left hand: Secondary | ICD-10-CM | POA: Diagnosis not present

## 2015-02-19 DIAGNOSIS — M069 Rheumatoid arthritis, unspecified: Secondary | ICD-10-CM

## 2015-02-19 DIAGNOSIS — M25642 Stiffness of left hand, not elsewhere classified: Secondary | ICD-10-CM

## 2015-02-19 NOTE — Patient Instructions (Signed)
Reviewed HEP & reps w/ pt, positioning and keeping wrist in neutral. Reviewed splint use and to begin weaning from custom splint 1 hour/day and wearing prefab wrist cock-up during those times (to assist with maintaining neutral wrist positioning/support). Pt verbalized understanding of this.

## 2015-02-19 NOTE — Therapy (Signed)
[facility] [address] [place], [address] Phone: [phone]   Fax:  [phone]  Occupational Therapy Treatment  Patient Details  Name: Yvonne Singleton MRN: [id number] Date of Birth: 04/24/1935 Referring Provider:  Christophe Louis, MD  Encounter Date: 02/19/2015      OT End of Session - 02/19/15 1103    Visit Number 10  Do G code in 10 visits   Number of Visits 16   Date for OT Re-Evaluation 03/18/15   Authorization Type Medicare - BCBS   Authorization Time Period 03/18/15   Authorization - Visit Number 10   Authorization - Number of Visits 16   OT Start Time 0955   OT Stop Time 1055   OT Time Calculation (min) 60 min   Activity Tolerance Patient tolerated treatment well;No increased pain   Behavior During Therapy [facility] for tasks assessed/performed      Past Medical History  Diagnosis Date  . Gout   . Parkinson's disease   . Back pain   . Cancer     breast  . Hypertension   . Hard of hearing   . Sleep apnea     Past Surgical History  Procedure Laterality Date  . Hip surgery    . Eye surgery Bilateral   . Cataract extraction Bilateral   . Joint replacement      bilateral hip  . Total knee arthroplasty Bilateral   . Abdominal hysterectomy    . Hand surgery      There were no vitals filed for this visit.  Visit Diagnosis:  Rheumatoid arthritis  Stiffness of hand joint, left      Subjective Assessment - 02/19/15 1049    Subjective  Pt denies pain left hand/wrist, reports that she is wearing her custom splint at home at all times except for 1 hour when she wears a pre-fabricated wrist cock-up splint. She verbalized understanding of no functional activity at this time except home program.   Patient Stated Goals Increase use of left hand for daily activities   Currently in Pain? No/denies                      OT Treatments/Exercises (OP) - 02/19/15 0001    Exercises   Exercises Hand;Wrist   Hand Exercises   Other Hand Exercises AROM/AAROM of MC flexion with IP extention gentle PROM of 4th and 5th composite flexion AAROM of composite flexion to 2 cm foam block Then to palm AROM to 2cm foam block AROM in palm tapping of digits off table thumb PA and RA AAROM Blocked AROM of IP of thumb flexion composite flexion to opposition AAROM Opposition to all digits -2 cm then 1 cm foam block - with hand supported in neutral prayer stretch for wrist extention 10 reps Place and hold wrist extention AROM wrist extention over arm rest - AAROM and place and hold wrist exetnion with hand in loose fist holding foam roll - unable to do - Ulnar deviating PROM RD and AROM on table and AAROM    Other Hand Exercises Added place and hold wrist extension w/ loose fist left; gentle PROM thumb w/ composite flexion; Left hand RD exercises/tapping digits 1-5 & performed in clinic.   LUE Contrast Bath   Time 15 minutes   Splinting   Splinting Re-molded custom splint for neutral wrist and extension, placing thumb in functional position seondary to decreased edema and need for readjustment since  initial fabrication; reviewed prefab wrist cock up splint for use 1 hr/day       Reviewed HEP & reps w/ pt, positioning and keeping wrist in neutral. Reviewed splint use and to begin weaning from custom splint 1 hour/day and wearing prefab wrist cock-up during those times (to assist with maintaining neutral wrist positioning/support). Pt verbalized understanding of this.          OT Education - 02/19/15 1102    Education provided Yes   Education Details Upgraded HEP and splinting   Person(s) Educated Patient   Methods Explanation;Demonstration   Comprehension Verbalized understanding          OT Short Term Goals - 02/19/15 1108    OT SHORT TERM GOAL #1   Title Decreased edema to w/in 2cm DPC, 0.5 PIP contralateral hand in 0-2 weeks   Time 2   Period Weeks   Status On-going    OT SHORT TERM GOAL #2   Title PIP joint extension to 0, no flexion contracture   Time 2   Period Weeks   Status On-going   OT SHORT TERM GOAL #3   Title Passive isolated digital flexionMPs to 50*, PIPs to 60*, DIPs to 20*, active hold exten at 0* all joints in 5 weeks   Time 5   Period Weeks   Status On-going   OT SHORT TERM GOAL #4   Title No rupture or gapping at repair site, compliance w/ full time orthosis, exchange night and day orthoses, reproduce HEP w/o cues   Time 3   Period Weeks   Status On-going   OT SHORT TERM GOAL #5   Title Iincision scar remodled, radial sensory nerve, dorsal ulnar sensory nerve asymptomatic w/ stretch, tolerate light and deep touch w/ pain <2/10   Time 8   Period Weeks   Status On-going           OT Long Term Goals - 02/19/15 1109    OT LONG TERM GOAL #1   Title Peri-tendonious adhesion remodled w/ no extension lag MP, PIP, DIP; active flexion 75% of contralateral digit, including thumb - allowing light functional got managing hygiene, ADL's (eating, utensils, holding a glass, toothbrush, hairbrush); open hand to grasp object of 4" diameter.   Time 8   Period Weeks   Status On-going   OT LONG TERM GOAL #2   Title Grip to improve to 50% compared to contralateral hand to use in ADL's in 5-8 weeks   Time 8   Period Weeks   Status On-going   OT LONG TERM GOAL #3   Title PRWHE for function improve to at least 15 points in 8 weeks   Time 8   Period Weeks   Status On-going               Plan - 02/19/15 1104    Clinical Impression Statement Custom splint adjustments as made today should improve positioning left hand/wrist. Pt verbalized understanding of splinting adjustments and to begin weaning an hour a day to pre-fab wrist cock up. Pt cont to require vc's and demonstration for HEP, reps and positioning for proper follow through, however, is able to demonstrate in clinic after education and verbalies understanding.    Pt will  benefit from skilled therapeutic intervention in order to improve on the following deficits (Retired) Decreased range of motion;Decreased knowledge of precautions;Decreased scar mobility;Decreased strength;Increased edema;Impaired UE functional use;Pain;Impaired flexibility   Rehab Potential Good   OT Frequency 2x / week  OT Duration 8 weeks   OT Treatment/Interventions Self-care/ADL training;Ultrasound;Fluidtherapy;Parrafin;Therapeutic exercise;Scar mobilization;Passive range of motion;Therapeutic activities;Therapeutic exercises;Splinting;Patient/family education;Manual Therapy   Plan Cont out-pt treatment plan with focus on LTG's and to begin weaning from protective splint & initiating putty at 8 weeks post op, but cont to assess for symptoms of UD left wrist.   Consulted and Agree with Plan of Care Patient          G-Codes - 03/13/2015 1110    Functional Assessment Tool Used Clinical judgement   Functional Limitation Self care   Self Care Current Status (I7782) At least 60 percent but less than 80 percent impaired, limited or restricted   Self Care Goal Status (U2353) At least 20 percent but less than 40 percent impaired, limited or restricted      Problem List Patient Active Problem List   Diagnosis Date Noted  . Rheumatoid arthritis 02/17/2015    Percell Miller Beth Dixon, OTR/L 03-13-2015, 11:14 AM  [facility] [address], [place], [address] Phone: [phone]   Fax:  [phone]

## 2015-02-24 ENCOUNTER — Encounter: Payer: Self-pay | Admitting: Occupational Therapy

## 2015-02-24 ENCOUNTER — Ambulatory Visit: Payer: Medicare Other | Admitting: Occupational Therapy

## 2015-02-24 DIAGNOSIS — M25642 Stiffness of left hand, not elsewhere classified: Secondary | ICD-10-CM

## 2015-02-24 DIAGNOSIS — M06842 Other specified rheumatoid arthritis, left hand: Secondary | ICD-10-CM | POA: Diagnosis not present

## 2015-02-24 DIAGNOSIS — M069 Rheumatoid arthritis, unspecified: Secondary | ICD-10-CM

## 2015-02-24 NOTE — Therapy (Signed)
[facility] [address], [place], [address] Phone: [phone]   Fax:  [phone]  Occupational Therapy Treatment  Patient Details  Name: Yvonne Singleton MRN: [id number] Date of Birth: August 29, 1935 Referring Provider:  Christophe Louis, MD  Encounter Date: 02/24/2015      OT End of Session - 02/24/15 1030    Visit Number 11   Number of Visits 16   Date for OT Re-Evaluation 03/18/15   Authorization Type Medicare - BCBS   Authorization Time Period 03/18/15   Authorization - Visit Number 11   Authorization - Number of Visits 16   OT Start Time 0948   OT Stop Time 6144   OT Time Calculation (min) 47 min   Activity Tolerance Patient tolerated treatment well;No increased pain   Behavior During Therapy [facility] for tasks assessed/performed      Past Medical History  Diagnosis Date  . Gout   . Parkinson's disease   . Back pain   . Cancer     breast  . Hypertension   . Hard of hearing   . Sleep apnea     Past Surgical History  Procedure Laterality Date  . Hip surgery    . Eye surgery Bilateral   . Cataract extraction Bilateral   . Joint replacement      bilateral hip  . Total knee arthroplasty Bilateral   . Abdominal hysterectomy    . Hand surgery      There were no vitals filed for this visit.  Visit Diagnosis:  Rheumatoid arthritis  Stiffness of hand joint, left      Subjective Assessment - 02/24/15 0947    Subjective  Pt denies pain left UE, "I can touch my pinky when i get it going", Pt cont to have tenderness distal, dorsal scar noted.   Patient Stated Goals Increase use of left hand for daily activities   Currently in Pain? No/denies                      OT Treatments/Exercises (OP) - 02/24/15 0001    Exercises   Exercises Hand;Wrist   Hand Exercises   Other Hand Exercises AROM/AAROM of MC flexion with IP extention gentle PROM of 4th and 5th composite flexion  AAROM of composite flexion to 2 cm foam block Then to palm AROM to 2cm foam block AROM in palm tapping of digits off table thumb PA and RA AAROM Blocked AROM of IP of thumb flexion composite flexion to opposition AAROM Opposition to all digits -2 cm then 1 cm foam block - with hand supported in neutral prayer stretch for wrist extention 10 reps Place and hold wrist extention AROM wrist extention over arm rest - AAROM and place and hold wrist exetnion with hand in loose fist holding foam roll - unable to do - Ulnar deviating PROM RD and AROM on table and AAROM    Other Hand Exercises Added place and hold wrist extension w/ loose fist left; gentle PROM thumb w/ composite flexion; Left hand RD exercises/tapping digits 1-5 & performed in clinic.   Modalities   Modalities Contrast Bath  Alternating Hot and cold pack x4 min and 68min   LUE Contrast Bath   Time 15 minutes        Neutral wrist during ex's to avoid UD at wrist and digits. Focus on active wrist extension and hold vs AAROM (place and hold).  Pt verbalized understanding and  returned demonstration.         OT Education - 02/24/15 1030    Education provided Yes   Education Details See instruction sheet   Person(s) Educated Patient   Methods Explanation;Demonstration   Comprehension Verbalized understanding;Verbal cues required          OT Short Term Goals - 02/19/15 1108    OT SHORT TERM GOAL #1   Title Decreased edema to w/in 2cm DPC, 0.5 PIP contralateral hand in 0-2 weeks   Time 2   Period Weeks   Status On-going   OT SHORT TERM GOAL #2   Title PIP joint extension to 0, no flexion contracture   Time 2   Period Weeks   Status On-going   OT SHORT TERM GOAL #3   Title Passive isolated digital flexionMPs to 50*, PIPs to 60*, DIPs to 20*, active hold exten at 0* all joints in 5 weeks   Time 5   Period Weeks   Status On-going   OT SHORT TERM GOAL #4   Title No rupture or gapping at repair site, compliance w/ full time  orthosis, exchange night and day orthoses, reproduce HEP w/o cues   Time 3   Period Weeks   Status On-going   OT SHORT TERM GOAL #5   Title Iincision scar remodled, radial sensory nerve, dorsal ulnar sensory nerve asymptomatic w/ stretch, tolerate light and deep touch w/ pain <2/10   Time 8   Period Weeks   Status On-going           OT Long Term Goals - 02/19/15 1109    OT LONG TERM GOAL #1   Title Peri-tendonious adhesion remodled w/ no extension lag MP, PIP, DIP; active flexion 75% of contralateral digit, including thumb - allowing light functional got managing hygiene, ADL's (eating, utensils, holding a glass, toothbrush, hairbrush); open hand to grasp object of 4" diameter.   Time 8   Period Weeks   Status On-going   OT LONG TERM GOAL #2   Title Grip to improve to 50% compared to contralateral hand to use in ADL's in 5-8 weeks   Time 8   Period Weeks   Status On-going   OT LONG TERM GOAL #3   Title PRWHE for function improve to at least 15 points in 8 weeks   Time 8   Period Weeks   Status On-going               Plan - 02/24/15 1036    Clinical Impression Statement Pt reports doing her exercies at home 2-3 x/day. She cont to demonstrate impairement in active wrist extension but is able to perform place and hold w/ AAROM for wrist extension. VC's for follow through and positioning.   Pt will benefit from skilled therapeutic intervention in order to improve on the following deficits (Retired) Decreased range of motion;Decreased knowledge of precautions;Decreased scar mobility;Decreased strength;Increased edema;Impaired UE functional use;Pain;Impaired flexibility   Rehab Potential Good   OT Frequency 2x / week   OT Duration 8 weeks   OT Treatment/Interventions Self-care/ADL training;Ultrasound;Fluidtherapy;Parrafin;Therapeutic exercise;Scar mobilization;Passive range of motion;Therapeutic activities;Therapeutic exercises;Splinting;Patient/family education;Manual  Therapy   Plan Cont weaning from splint and initiate putty at 8 weeks post-op. Monitor for signs of UD at left wrist with active ROM.   Consulted and Agree with Plan of Care Patient        Problem List Patient Active Problem List   Diagnosis Date Noted  . Rheumatoid arthritis 02/17/2015    Carlynn Herald,  Marcela Alatorre Ardath Sax, OTR/L 02/24/2015, 10:41 AM  [facility] [address] [place], [address] Phone: [phone]   Fax:  [phone]

## 2015-02-24 NOTE — Patient Instructions (Signed)
Neutral wrist during ex's to avoid UD at wrist and digits. Focus on active wrist extension and hold vs AAROM (place and hold).  Pt verbalized understanding and returned demonstration.

## 2015-02-26 ENCOUNTER — Encounter: Payer: Self-pay | Admitting: Occupational Therapy

## 2015-02-28 ENCOUNTER — Encounter: Payer: Self-pay | Admitting: Occupational Therapy

## 2015-02-28 ENCOUNTER — Ambulatory Visit: Payer: Medicare Other | Attending: Specialist | Admitting: Occupational Therapy

## 2015-02-28 DIAGNOSIS — M25642 Stiffness of left hand, not elsewhere classified: Secondary | ICD-10-CM

## 2015-02-28 DIAGNOSIS — M069 Rheumatoid arthritis, unspecified: Secondary | ICD-10-CM | POA: Diagnosis not present

## 2015-02-28 NOTE — Therapy (Signed)
[facility] [address], [place], [address] Phone: [phone]   Fax:  [phone]  Occupational Therapy Treatment  Patient Details  Name: Yvonne Singleton MRN: [id number] Date of Birth: 06-07-1935 Referring Provider:  Perrin Maltese, MD  Encounter Date: 02/28/2015      OT End of Session - 02/28/15 1410    Visit Number 12   Number of Visits 16   Date for OT Re-Evaluation 03/18/15   Authorization Type Medicare - BCBS   OT Start Time [phone]   OT Stop Time 1028   OT Time Calculation (min) 71 min   Activity Tolerance Patient tolerated treatment well;No increased pain   Behavior During Therapy [facility] for tasks assessed/performed      Past Medical History  Diagnosis Date  . Gout   . Parkinson's disease   . Back pain   . Cancer     breast  . Hypertension   . Hard of hearing   . Sleep apnea     Past Surgical History  Procedure Laterality Date  . Hip surgery    . Eye surgery Bilateral   . Cataract extraction Bilateral   . Joint replacement      bilateral hip  . Total knee arthroplasty Bilateral   . Abdominal hysterectomy    . Hand surgery      There were no vitals filed for this visit.  Visit Diagnosis:  Rheumatoid arthritis  Stiffness of hand joint, left      Subjective Assessment - 02/28/15 0941    Subjective  Arthritis pain been worse - my feet and hands - and cannot take arthritis pain meds because of liver- my index finger cannot tap up lilke the others    Patient is accompained by: Family member   Currently in Pain? Yes   Pain Score 2    Pain Location Foot   Pain Orientation Right;Left   Pain Descriptors / Indicators Constant;Dull   Pain Type Chronic pain   Pain Onset Other (comment)                      OT Treatments/Exercises (OP) - 02/28/15 0001    Exercises   Exercises Hand;Wrist   Wrist Exercises   Other wrist exercises provided RD last 5 min in moist  heat, PROM RD , AROM on paper FOR RD , assist with R hand - do bilateral RD    Other wrist exercises Prayer stretch , place and hold wrist extention  , then loose fist holdling light ojbect and place and hold wrist extention    Additional Wrist Exercises   Theraputty - Roll add to HEP    Hand Exercises   Other Hand Exercises PROM of digits flexion and thumb gentle flexion , add light blue putty in neutroal forearm on table - light blue putty for gripping , opposition and lateral grip    Other Hand Exercises Husband to keep wrist from flexion and on table to keep neutral and not ulnar deviation    Moist Heat Therapy   Number Minutes Moist Heat 10 Minutes   Moist Heat Location Hand;Wrist;Other (comment)   Splinting   Splinting 2 hrs in wrist splint and 1 hour in hard , alternate during daytime and night time hard one on   Manual Therapy   Manual therapy comments Scar mobs                 OT  Education - 02/28/15 1410    Education provided Yes   Education Details see instruction    Person(s) Educated Patient   Methods Explanation;Demonstration;Verbal cues   Comprehension Verbalized understanding;Returned demonstration;Tactile cues required;Verbal cues required          OT Short Term Goals - 02/19/15 1108    OT SHORT TERM GOAL #1   Title Decreased edema to w/in 2cm DPC, 0.5 PIP contralateral hand in 0-2 weeks   Time 2   Period Weeks   Status On-going   OT SHORT TERM GOAL #2   Title PIP joint extension to 0, no flexion contracture   Time 2   Period Weeks   Status On-going   OT SHORT TERM GOAL #3   Title Passive isolated digital flexionMPs to 50*, PIPs to 60*, DIPs to 20*, active hold exten at 0* all joints in 5 weeks   Time 5   Period Weeks   Status On-going   OT SHORT TERM GOAL #4   Title No rupture or gapping at repair site, compliance w/ full time orthosis, exchange night and day orthoses, reproduce HEP w/o cues   Time 3   Period Weeks   Status On-going   OT  SHORT TERM GOAL #5   Title Iincision scar remodled, radial sensory nerve, dorsal ulnar sensory nerve asymptomatic w/ stretch, tolerate light and deep touch w/ pain <2/10   Time 8   Period Weeks   Status On-going           OT Long Term Goals - 02/19/15 1109    OT LONG TERM GOAL #1   Title Peri-tendonious adhesion remodled w/ no extension lag MP, PIP, DIP; active flexion 75% of contralateral digit, including thumb - allowing light functional got managing hygiene, ADL's (eating, utensils, holding a glass, toothbrush, hairbrush); open hand to grasp object of 4" diameter.   Time 8   Period Weeks   Status On-going   OT LONG TERM GOAL #2   Title Grip to improve to 50% compared to contralateral hand to use in ADL's in 5-8 weeks   Time 8   Period Weeks   Status On-going   OT LONG TERM GOAL #3   Title PRWHE for function improve to at least 15 points in 8 weeks   Time 8   Period Weeks   Status On-going               Plan - 02/28/15 1414    Clinical Impression Statement Pt show still decrease wrist extention -able to do open hand place and hold but if close fist - UD - add more PROM for RD and wrist extnetion , and AAROM and place and hold for RD and extention against gravity - did add light putty for gripping and thumb - she do want to hyper extention at IP of thumb - needed cueding for flexion and husband to assist with wrist and forearm stabliizzation    Pt will benefit from skilled therapeutic intervention in order to improve on the following deficits (Retired) Decreased range of motion;Decreased knowledge of precautions;Decreased scar mobility;Decreased strength;Increased edema;Impaired UE functional use;Pain;Impaired flexibility   Rehab Potential Good   OT Frequency 2x / week   OT Duration 8 weeks   OT Treatment/Interventions Self-care/ADL training;Ultrasound;Fluidtherapy;Parrafin;Therapeutic exercise;Scar mobilization;Passive range of motion;Therapeutic activities;Therapeutic  exercises;Splinting;Patient/family education;Manual Therapy   Plan How doing with splints , HEP upgraded last time -and increase RD nad wrist extneiton    Consulted and Agree with Plan of Care Patient  Problem List Patient Active Problem List   Diagnosis Date Noted  . Rheumatoid arthritis 02/17/2015    Rosalyn Gess OTR/ L, CLT 02/28/2015, 2:19 PM  [facility] [address], [place], [address] Phone: [phone]   Fax:  [phone]

## 2015-02-28 NOTE — Patient Instructions (Signed)
HEP provided for PROM RD and Wrist extenion  AAROM RD  On paper and assist with R hand   Place and hold wrist extenion open hand and close hand  Husband need to assist with forearm stabilization   Light blue putty (easy) add for gripping , lat grip and 3 point pinch - with forearm on table and husband keep wrist from flexion

## 2015-03-03 ENCOUNTER — Encounter: Payer: Self-pay | Admitting: Occupational Therapy

## 2015-03-03 ENCOUNTER — Ambulatory Visit: Payer: Medicare Other | Admitting: Occupational Therapy

## 2015-03-03 DIAGNOSIS — M069 Rheumatoid arthritis, unspecified: Secondary | ICD-10-CM

## 2015-03-03 DIAGNOSIS — M25642 Stiffness of left hand, not elsewhere classified: Secondary | ICD-10-CM

## 2015-03-03 DIAGNOSIS — M06842 Other specified rheumatoid arthritis, left hand: Secondary | ICD-10-CM | POA: Diagnosis not present

## 2015-03-03 NOTE — Therapy (Signed)
[facility] [address], [place], [address] Phone: [phone]   Fax:  [phone]  Occupational Therapy Treatment  Patient Details  Name: Yvonne Singleton MRN: [id number] Date of Birth: December 15, 1934 Referring Provider:  Christophe Louis, MD  Encounter Date: 03/03/2015      OT End of Session - 03/03/15 1046    Visit Number 13   Number of Visits 16   Date for OT Re-Evaluation 03/18/15   Authorization Type Medicare - BCBS   Authorization Time Period 03/18/15   Authorization - Visit Number 12   Authorization - Number of Visits 16   OT Start Time 0949   OT Stop Time 1041   OT Time Calculation (min) 52 min   Activity Tolerance Patient tolerated treatment well   Behavior During Therapy [facility] for tasks assessed/performed      Past Medical History  Diagnosis Date  . Gout   . Parkinson's disease   . Back pain   . Cancer     breast  . Hypertension   . Hard of hearing   . Sleep apnea     Past Surgical History  Procedure Laterality Date  . Hip surgery    . Eye surgery Bilateral   . Cataract extraction Bilateral   . Joint replacement      bilateral hip  . Total knee arthroplasty Bilateral   . Abdominal hysterectomy    . Hand surgery      There were no vitals filed for this visit.  Visit Diagnosis:  Rheumatoid arthritis  Stiffness of hand joint, left      Subjective Assessment - 03/03/15 0954    Subjective  Pt reports arthritis pain has been worse in "both hands and wrists and my feet" Pt reports that she cannot take many pain medications b/c of her liver.   Patient is accompained by: --  Pt states "I drove myself today and I haven't been told that I can drive yet by the doctor, but my husband couldn't bring me today"   Patient Stated Goals Increase use of left hand for daily activities   Currently in Pain? Yes   Pain Score 3    Pain Location Wrist   Pain Orientation Right;Left   Pain  Descriptors / Indicators Constant;Dull   Pain Type Chronic pain   Multiple Pain Sites Yes   Pain Score 3   Pain Location Foot   Pain Orientation Right;Left   Pain Descriptors / Indicators Aching;Constant   Pain Type Chronic pain                      OT Treatments/Exercises (OP) - 03/03/15 0001    Exercises   Exercises Hand;Wrist   Wrist Exercises   Other wrist exercises RD last 5 min in moist heat, PROM RD , AROM on paper FOR RD , assist with R hand - do bilateral RD    Other wrist exercises Prayer stretch , place and hold wrist extention  , then loose fist holdling light ojbect and place and hold wrist extention    Additional Wrist Exercises   Theraputty - Roll added to HEP and performed    Theraputty - Grip Grip x5 (husband holding wrist in neutral)   Theraputty - Pinch 3 point and lateral pinch left x5 reps each (husband/therapist assists to keep wrist in neutral)   Hand Exercises   Other Hand Exercises PROM of digits flexion and thumb gentle flexion ,  add light blue putty in neutroal forearm on table - light blue putty for gripping , opposition and lateral grip    Other Hand Exercises Husband to keep wrist from flexion and on table to keep neutral and not ulnar deviation    Modalities   Modalities Moist Heat  x5 min left wrist neutral, then Active asistive RD stretchx5   Moist Heat Therapy   Number Minutes Moist Heat 10 Minutes   Moist Heat Location Hand;Wrist;Other (comment)  see above   Manual Therapy   Manual Therapy Joint mobilization;Other (comment)  Scar management & RD, wrist exten x10 min       Review and performance of HEP as issued on 02/28/15. Pt requires Min-mod verbal and tactile cues for positioning and follow through noted. Especially putty and RD and Wrist exten ex's (keeping wrist in neutral position).          OT Education - 03/03/15 1046    Education provided Yes   Education Details Review and peform HEP as issued 02/28/15   Person(s)  Educated Patient   Methods Explanation;Demonstration;Tactile cues;Verbal cues  Pt has handout   Comprehension Verbalized understanding;Tactile cues required;Verbal cues required;Need further instruction          OT Short Term Goals - 02/19/15 1108    OT SHORT TERM GOAL #1   Title Decreased edema to w/in 2cm DPC, 0.5 PIP contralateral hand in 0-2 weeks   Time 2   Period Weeks   Status On-going   OT SHORT TERM GOAL #2   Title PIP joint extension to 0, no flexion contracture   Time 2   Period Weeks   Status On-going   OT SHORT TERM GOAL #3   Title Passive isolated digital flexionMPs to 50*, PIPs to 60*, DIPs to 20*, active hold exten at 0* all joints in 5 weeks   Time 5   Period Weeks   Status On-going   OT SHORT TERM GOAL #4   Title No rupture or gapping at repair site, compliance w/ full time orthosis, exchange night and day orthoses, reproduce HEP w/o cues   Time 3   Period Weeks   Status On-going   OT SHORT TERM GOAL #5   Title Iincision scar remodled, radial sensory nerve, dorsal ulnar sensory nerve asymptomatic w/ stretch, tolerate light and deep touch w/ pain <2/10   Time 8   Period Weeks   Status On-going           OT Long Term Goals - 02/19/15 1109    OT LONG TERM GOAL #1   Title Peri-tendonious adhesion remodled w/ no extension lag MP, PIP, DIP; active flexion 75% of contralateral digit, including thumb - allowing light functional got managing hygiene, ADL's (eating, utensils, holding a glass, toothbrush, hairbrush); open hand to grasp object of 4" diameter.   Time 8   Period Weeks   Status On-going   OT LONG TERM GOAL #2   Title Grip to improve to 50% compared to contralateral hand to use in ADL's in 5-8 weeks   Time 8   Period Weeks   Status On-going   OT LONG TERM GOAL #3   Title PRWHE for function improve to at least 15 points in 8 weeks   Time 8   Period Weeks   Status On-going               Plan - 03/03/15 1047    Clinical Impression  Statement Pt requires verbal and tactile cues  for proper positioning and follow through with HEP. Focus on RD, wrist extension w/ left wrist in neutral and avoid UD and wrist flexion w/ grasp/digital flexion.    Pt will benefit from skilled therapeutic intervention in order to improve on the following deficits (Retired) Decreased range of motion;Decreased knowledge of precautions;Decreased scar mobility;Decreased strength;Increased edema;Impaired UE functional use;Pain;Impaired flexibility   Rehab Potential Good   OT Frequency 2x / week   OT Duration 8 weeks   OT Treatment/Interventions Self-care/ADL training;Ultrasound;Fluidtherapy;Parrafin;Therapeutic exercise;Scar mobilization;Passive range of motion;Therapeutic activities;Therapeutic exercises;Splinting;Patient/family education;Manual Therapy   Plan Review HEP and splint use, focus on wrist exten and RD, grasp/digital flexion w/ neutral wrist left.   Consulted and Agree with Plan of Care Patient        Problem List Patient Active Problem List   Diagnosis Date Noted  . Rheumatoid arthritis 02/17/2015    Percell Miller Beth Dixon, OTR/L 03/03/2015, 10:52 AM  [facility] [address] [place], [address] Phone: [phone]   Fax:  [phone]

## 2015-03-03 NOTE — Patient Instructions (Signed)
Review and performance of HEP as issued on 02/28/15. Pt requires Min-mod verbal and tactile cues for positioning and follow through noted. Especially putty and RD and Wrist exten ex's (keeping wrist in neutral position).

## 2015-03-06 ENCOUNTER — Ambulatory Visit: Payer: Medicare Other | Admitting: Occupational Therapy

## 2015-03-06 DIAGNOSIS — M069 Rheumatoid arthritis, unspecified: Secondary | ICD-10-CM

## 2015-03-06 DIAGNOSIS — M25642 Stiffness of left hand, not elsewhere classified: Secondary | ICD-10-CM

## 2015-03-06 DIAGNOSIS — M06842 Other specified rheumatoid arthritis, left hand: Secondary | ICD-10-CM | POA: Diagnosis not present

## 2015-03-06 NOTE — Therapy (Signed)
[facility] [address] [place], [address] Phone: [phone]   Fax:  [phone]  Occupational Therapy Treatment  Patient Details  Name: Yvonne Singleton MRN: [id number] Date of Birth: [birth year] Referring Provider:  Perrin Maltese, MD  Encounter Date: 03/06/2015      OT End of Session - 03/06/15 1428    Visit Number 14   Number of Visits 16   Date for OT Re-Evaluation 03/18/15   Authorization Type Medicare - BCBS   OT Start Time 1305   OT Stop Time 1412   OT Time Calculation (min) 67 min   Activity Tolerance Patient tolerated treatment well   Behavior During Therapy [facility] for tasks assessed/performed      Past Medical History  Diagnosis Date  . Gout   . Parkinson's disease   . Back pain   . Cancer     breast  . Hypertension   . Hard of hearing   . Sleep apnea     Past Surgical History  Procedure Laterality Date  . Hip surgery    . Eye surgery Bilateral   . Cataract extraction Bilateral   . Joint replacement      bilateral hip  . Total knee arthroplasty Bilateral   . Abdominal hysterectomy    . Hand surgery      There were no vitals filed for this visit.  Visit Diagnosis:  Rheumatoid arthritis  Stiffness of hand joint, left      Subjective Assessment - 03/06/15 1417    Subjective  Doing okay - splints still rotating 2hrs/1hr - arthritis pain increase if working my hand to much - did my exericses 2 x yesterday - my husband helps me    Patient Stated Goals Increase use of left hand for daily activities   Currently in Pain? Yes   Pain Score 3    Pain Location Hand   Pain Orientation Left;Right   Pain Descriptors / Indicators Constant   Pain Onset Other (comment)   Multiple Pain Sites Yes                      OT Treatments/Exercises (OP) - 03/06/15 0001    Exercises   Exercises Hand;Wrist   Wrist Exercises   Other wrist exercises RD stretch last 5 min  in heat, AROM assist with bilateral on pillow in lap , add this date 1 kg ball 10 reps - unable to do with 1 lbs grasping - but larger object like jar better - add for HEP    Other wrist exercises Prayer stretch PROM - pt to use wrist extentio and normal ROM to bring hand up to prayer stretch, place and hold wrist extention open hand and loose close fist - place and hold  - assist to keep forearm down ,   Hand Exercises   Other Hand Exercises Gripping putty with palm down and wrist down , Lat grip - but need mod A to keep digits in fist nad thumb IP flexion during lateral grip    Other Hand Exercises 3 point grip change for pt to do with putty on table and wrist neurtral if possible    LUE Paraffin   Number Minutes Paraffin 10 Minutes   LUE Paraffin Location Hand;Wrist   Comments With heatingpad to increase ROM and decrease pain at [facility] -stretch wrist in RD last 5 min    Splinting   Splinting 2 hrs in  wrist splint and 1 hour in hard , alternate during daytime and night time hard one on   Manual Therapy   Manual therapy comments Scar mobs   applied kinestiotape to scar this date - pt ed on precaution                OT Education - 03/06/15 1427    Education provided Yes   Education Details see pt instruction   Methods Explanation;Demonstration;Tactile cues;Verbal cues   Comprehension Verbalized understanding;Returned demonstration;Verbal cues required;Tactile cues required          OT Short Term Goals - 03/06/15 1431    OT SHORT TERM GOAL #1   Title Decreased edema to w/in 2cm DPC, 0.5 PIP contralateral hand in 0-2 weeks   Time 2   Period Weeks   Status On-going   OT SHORT TERM GOAL #2   Status Achieved   OT SHORT TERM GOAL #3   Title Passive isolated digital flexionMPs to 50*, PIPs to 60*, DIPs to 20*, active hold exten at 0* all joints in 5 weeks   Time 4   Period Weeks   Status On-going   OT SHORT TERM GOAL #4   Time 3   Period Weeks   Status On-going   OT SHORT  TERM GOAL #5   Title Iincision scar remodled, radial sensory nerve, dorsal ulnar sensory nerve asymptomatic w/ stretch, tolerate light and deep touch w/ pain <2/10   Status Achieved           OT Long Term Goals - 03/06/15 1432    OT LONG TERM GOAL #1   Title Peri-tendonious adhesion remodled w/ no extension lag MP, PIP, DIP; active flexion 75% of contralateral digit, including thumb - allowing light functional got managing hygiene, ADL's (eating, utensils, holding a glass, toothbrush, hairbrush); open hand to grasp object of 4" diameter.   Time 4   Period Weeks   Status On-going   OT LONG TERM GOAL #2   Title Grip to improve to 50% compared to contralateral hand to use in ADL's in 5-8 weeks   Time 4   Period Weeks   Status On-going   OT LONG TERM GOAL #3   Title PRWHE for function improve to at least 15 points in 8 weeks   Time 4   Period Weeks   Status On-going               Plan - 03/06/15 1428    Clinical Impression Statement Pt made progress since this OT seen her last Friday - still hardest time with wrist extention with digits in flexion - and some UD during gripping and wrist extention - cont to increase exenteion and RD as well asgrip and prehension    Pt will benefit from skilled therapeutic intervention in order to improve on the following deficits (Retired) Decreased range of motion;Decreased knowledge of precautions;Decreased scar mobility;Decreased strength;Increased edema;Impaired UE functional use;Pain;Impaired flexibility   Rehab Potential Good   OT Frequency 2x / week   OT Duration 4 weeks   OT Treatment/Interventions Self-care/ADL training;Ultrasound;Fluidtherapy;Parrafin;Therapeutic exercise;Scar mobilization;Passive range of motion;Therapeutic activities;Therapeutic exercises;Splinting;Patient/family education;Manual Therapy   Plan assess how kinesiotape to scar did- adress some light functional task in clinic with good mechanic - wrist extention and RD  with light grip -    Consulted and Agree with Plan of Care Patient        Problem List Patient Active Problem List   Diagnosis Date Noted  . Rheumatoid arthritis  02/17/2015    Loie Jahr  OTR/L; CLT 03/06/2015, 2:33 PM  [facility] [address] [place], [address] Phone: [phone]   Fax:  [phone]

## 2015-03-06 NOTE — Patient Instructions (Signed)
Pt need review of HEP - min A to do RD and extention of wrist correct Change wrist exention 2 exercises open hand and close hand over armrest  And during prayer stretch pt to pay attention to bring hand up in position with wrist neutral - using hand more normal - picking up light objects during session and during HEP   Putty change gripping to palm down   ANd 3 point with putty on table to keep wrist neutral   Lat grip needed mod A - keep digits in loose fist and add flexion to tip of thumb

## 2015-03-10 ENCOUNTER — Ambulatory Visit: Payer: Medicare Other | Admitting: Occupational Therapy

## 2015-03-10 DIAGNOSIS — M069 Rheumatoid arthritis, unspecified: Secondary | ICD-10-CM

## 2015-03-10 DIAGNOSIS — M25642 Stiffness of left hand, not elsewhere classified: Secondary | ICD-10-CM

## 2015-03-10 DIAGNOSIS — M06842 Other specified rheumatoid arthritis, left hand: Secondary | ICD-10-CM | POA: Diagnosis not present

## 2015-03-10 NOTE — Patient Instructions (Signed)
Same exercise and use of splints   Reinforce importance with use of hand in light activities make sure wrist is straigth   Need mod A with lateral grip - need review again next time

## 2015-03-10 NOTE — Therapy (Signed)
[facility] [address], [place], [address] Phone: [phone]   Fax:  [phone]  Occupational Therapy Treatment  Patient Details  Name: Yvonne Singleton MRN: [id number] Date of Birth: 1935-08-27 Referring Provider:  Christophe Louis, MD  Encounter Date: 03/10/2015      OT End of Session - 03/10/15 1357    OT Start Time 1305   OT Stop Time 1345   OT Time Calculation (min) 40 min      Past Medical History  Diagnosis Date  . Gout   . Parkinson's disease   . Back pain   . Cancer     breast  . Hypertension   . Hard of hearing   . Sleep apnea     Past Surgical History  Procedure Laterality Date  . Hip surgery    . Eye surgery Bilateral   . Cataract extraction Bilateral   . Joint replacement      bilateral hip  . Total knee arthroplasty Bilateral   . Abdominal hysterectomy    . Hand surgery      There were no vitals filed for this visit.  Visit Diagnosis:  Rheumatoid arthritis  Stiffness of hand joint, left      Subjective Assessment - 03/10/15 1344    Subjective  I had shot in my eye it really hurts - more than I thought - I maybe want to leave early -  I lost my wrist splint going this weekend to [facility] - and then you maybe need to go over the exercises - my memory not good - the tape on my scar did really good - it looks better    Patient Stated Goals Increase use of left hand for daily activities   Currently in Pain? Yes   Pain Score 3    Pain Location Wrist   Pain Orientation Left   Pain Descriptors / Indicators Aching   Pain Type Chronic pain   Pain Onset Other (comment)                      OT Treatments/Exercises (OP) - 03/10/15 0001    Exercises   Exercises Hand;Wrist   Wrist Exercises   Other wrist exercises RD stretch last 5 min in heat, AROM assist with bilateral on pillow in lap , add this date 1 kg ball 10 reps - unable to do with 1 lbs  grasping - but larger object like jar better - add for HEP    Other wrist exercises Prayer stretch PROM - pt to use wrist extentio and normal ROM to bring hand up to prayer stretch, place and hold wrist extention open hand and loose close fist - place and hold  - assist to keep forearm down ,   Hand Exercises   Other Hand Exercises gripping putty with hand down, lateral grip - needed mod A, 2 point pinch alternate  digits    Other Hand Exercises light blue putty    LUE Contrast Bath   Time 12 minutes   Splinting   Splinting Fitted with new wrist splint - and schedule review again - wrist 2 hrs and hand splint 1 hour - alternate during daytime    Manual Therapy   Manual therapy comments Scar mobs   kinesiotape done - no pull paralel and full 100%across                OT Education - 03/10/15 1354  Education provided Yes   Education Details splint wearing and schedule and HEP    Person(s) Educated Patient   Methods Explanation;Demonstration;Tactile cues;Verbal cues   Comprehension Verbalized understanding;Returned demonstration;Verbal cues required          OT Short Term Goals - 03/06/15 1431    OT SHORT TERM GOAL #1   Title Decreased edema to w/in 2cm DPC, 0.5 PIP contralateral hand in 0-2 weeks   Time 2   Period Weeks   Status On-going   OT SHORT TERM GOAL #2   Status Achieved   OT SHORT TERM GOAL #3   Title Passive isolated digital flexionMPs to 50*, PIPs to 60*, DIPs to 20*, active hold exten at 0* all joints in 5 weeks   Time 4   Period Weeks   Status On-going   OT SHORT TERM GOAL #4   Time 3   Period Weeks   Status On-going   OT SHORT TERM GOAL #5   Title Iincision scar remodled, radial sensory nerve, dorsal ulnar sensory nerve asymptomatic w/ stretch, tolerate light and deep touch w/ pain <2/10   Status Achieved           OT Long Term Goals - 03/06/15 1432    OT LONG TERM GOAL #1   Title Peri-tendonious adhesion remodled w/ no extension lag MP, PIP,  DIP; active flexion 75% of contralateral digit, including thumb - allowing light functional got managing hygiene, ADL's (eating, utensils, holding a glass, toothbrush, hairbrush); open hand to grasp object of 4" diameter.   Time 4   Period Weeks   Status On-going   OT LONG TERM GOAL #2   Title Grip to improve to 50% compared to contralateral hand to use in ADL's in 5-8 weeks   Time 4   Period Weeks   Status On-going   OT LONG TERM GOAL #3   Title PRWHE for function improve to at least 15 points in 8 weeks   Time 4   Period Weeks   Status On-going               Plan - 03/10/15 1354    Clinical Impression Statement Treatment cut short this date because of increase pain in eye from shot - review and pt still need mod A with some of the exerciises - can up her putty next time and work on focus on keepeing wris straight with funcional tasks    Pt will benefit from skilled therapeutic intervention in order to improve on the following deficits (Retired) Decreased range of motion;Decreased knowledge of precautions;Decreased scar mobility;Decreased strength;Increased edema;Impaired UE functional use;Pain;Impaired flexibility   Rehab Potential Good   OT Frequency 2x / week   OT Duration 4 weeks   OT Treatment/Interventions Self-care/ADL training;Ultrasound;Fluidtherapy;Parrafin;Therapeutic exercise;Scar mobilization;Passive range of motion;Therapeutic activities;Therapeutic exercises;Splinting;Patient/family education;Manual Therapy   Plan Putty increase and check again on kinesiotape - and light functional tasks in clinic with wrist neutral at least    OT Home Exercise Plan see pt instructiona    Consulted and Agree with Plan of Care Patient        Problem List Patient Active Problem List   Diagnosis Date Noted  . Rheumatoid arthritis 02/17/2015    Rosalyn Gess  OTR/L, CLT 03/10/2015, 1:58 PM  [facility] [address], [place], [address] Phone: [phone]   Fax:  [phone]

## 2015-03-13 ENCOUNTER — Ambulatory Visit: Payer: Medicare Other | Admitting: Occupational Therapy

## 2015-03-13 ENCOUNTER — Encounter: Payer: Self-pay | Admitting: Pain Medicine

## 2015-03-13 ENCOUNTER — Ambulatory Visit: Payer: Medicare Other | Attending: Pain Medicine | Admitting: Pain Medicine

## 2015-03-13 VITALS — BP 141/60 | HR 65 | Temp 98.5°F | Resp 18 | Ht 63.0 in | Wt 184.0 lb

## 2015-03-13 DIAGNOSIS — M47812 Spondylosis without myelopathy or radiculopathy, cervical region: Secondary | ICD-10-CM | POA: Diagnosis not present

## 2015-03-13 DIAGNOSIS — Z96643 Presence of artificial hip joint, bilateral: Secondary | ICD-10-CM | POA: Insufficient documentation

## 2015-03-13 DIAGNOSIS — M069 Rheumatoid arthritis, unspecified: Secondary | ICD-10-CM

## 2015-03-13 DIAGNOSIS — M5136 Other intervertebral disc degeneration, lumbar region: Secondary | ICD-10-CM | POA: Insufficient documentation

## 2015-03-13 DIAGNOSIS — M79605 Pain in left leg: Secondary | ICD-10-CM | POA: Diagnosis present

## 2015-03-13 DIAGNOSIS — M533 Sacrococcygeal disorders, not elsewhere classified: Secondary | ICD-10-CM | POA: Diagnosis not present

## 2015-03-13 DIAGNOSIS — M25642 Stiffness of left hand, not elsewhere classified: Secondary | ICD-10-CM

## 2015-03-13 DIAGNOSIS — M06842 Other specified rheumatoid arthritis, left hand: Secondary | ICD-10-CM | POA: Diagnosis not present

## 2015-03-13 DIAGNOSIS — M961 Postlaminectomy syndrome, not elsewhere classified: Secondary | ICD-10-CM | POA: Insufficient documentation

## 2015-03-13 DIAGNOSIS — M19019 Primary osteoarthritis, unspecified shoulder: Secondary | ICD-10-CM | POA: Insufficient documentation

## 2015-03-13 DIAGNOSIS — M51369 Other intervertebral disc degeneration, lumbar region without mention of lumbar back pain or lower extremity pain: Secondary | ICD-10-CM | POA: Insufficient documentation

## 2015-03-13 DIAGNOSIS — Z9889 Other specified postprocedural states: Secondary | ICD-10-CM | POA: Diagnosis not present

## 2015-03-13 DIAGNOSIS — M79602 Pain in left arm: Secondary | ICD-10-CM | POA: Diagnosis present

## 2015-03-13 DIAGNOSIS — M19011 Primary osteoarthritis, right shoulder: Secondary | ICD-10-CM

## 2015-03-13 DIAGNOSIS — M79601 Pain in right arm: Secondary | ICD-10-CM | POA: Diagnosis present

## 2015-03-13 DIAGNOSIS — M5030 Other cervical disc degeneration, unspecified cervical region: Secondary | ICD-10-CM | POA: Diagnosis not present

## 2015-03-13 DIAGNOSIS — Z96649 Presence of unspecified artificial hip joint: Secondary | ICD-10-CM | POA: Diagnosis not present

## 2015-03-13 MED ORDER — OXYCODONE HCL 5 MG PO CAPS: ORAL_CAPSULE | ORAL | Status: DC

## 2015-03-13 NOTE — Patient Instructions (Signed)
Pt to do rolling putty for wrist extention and digits extention - work on pinching putty and keeping wrist neutral - and then gripping sideways to get more MC flexion and composite  Upgrade to teal putty   And splint wrist 3-4 hrs on and hard one on 1-2 hrs alternate - sleep with wrist

## 2015-03-13 NOTE — Patient Instructions (Addendum)
Continue present medications.  F/U PCP for evaliation of  BP and general medical  Condition.  Continue physical therapy treatments as presently doing  F/U surgical evaluation.  F/U neurological evaluation.  May consider radiofrequency rhizolysis or intraspinal procedures pending response to present treatment and F/U evaluation.  Patient to call Pain Management Center should patient have concerns prior to scheduled return appointment. Pain Management Discharge Instructions  General Discharge Instructions :  If you need to reach your doctor call: Monday-Friday 8:00 am - 4:00 pm at [phone] or toll free [phone].  After clinic hours [phone] to have operator reach doctor.  Bring all of your medication bottles to all your appointments in the pain clinic.  To cancel or reschedule your appointment with Pain Management please remember to call 24 hours in advance to avoid a fee.  Refer to the educational materials which you have been given on: General Risks, I had my Procedure. Discharge Instructions, Post Sedation.  Post Procedure Instructions:  The drugs you were given will stay in your system until tomorrow, so for the next 24 hours you should not drive, make any legal decisions or drink any alcoholic beverages.  You may eat anything you prefer, but it is better to start with liquids then soups and crackers, and gradually work up to solid foods.  Please notify your doctor immediately if you have any unusual bleeding, trouble breathing or pain that is not related to your normal pain.  Depending on the type of procedure that was done, some parts of your body may feel week and/or numb.  This usually clears up by tonight or the next day.  Walk with the use of an assistive device or accompanied by an adult for the 24 hours.  You may use ice on the affected area for the first 24 hours.  Put ice in a Ziploc bag and cover with a towel and place against area 15 minutes on 15  minutes off.  You may switch to heat after 24 hours.

## 2015-03-13 NOTE — Progress Notes (Signed)
   Subjective:    Patient ID: Yvonne Singleton, female    DOB: 1935-05-31, 80 y.o.   MRN: [id number]  HPI   patient is 80 year old female returns to [facility] for further evaluation and treatment of pain involving the upper extremity regions and lower extremity regions especially the region of the left upper extremity. Patient is status post surgical intervention of the left upper extremity and will continue to follow-up with Dr. Tamala Julian for further evaluation and treatment of the upper extremity. Patient states that pain involving the region of the shoulder and the lower back and lower extremity regions fairly well controlled at this time. Until present medications and avoid interventional treatment. The patient is understanding and agrees with treatment plan.     Review of Systems     Objective:   Physical Exam   tennis to palpation of the splenius capitis occipitalis muscles of mild degree. With tenderness over the cervical and thoracic facet regions of mild degree. There was tennis to palpation of the acromioclavicular glenohumeral joint region a moderate degree. Patient was with decreased grip strength with increased pain with Tinel and Phalen's maneuver. There was tenderness over the thoracic paraspinal musculature region of the lower thoracic region of mild to moderate degree no crepitus of the thoracic region noted.  Palpation of the lumbar paraspinal muscles and lumbar facet region reproduces moderate discomfort with tenderness over the PSIS and PII S regions of moderate degree. Straight leg raise was tolerates approximately 20 without increase of pain with dorsiflexion noted. There was negative clonus negative Homans. Abdomen nontender with no costovertebral angle tenderness noted.     Assessment & Plan:    degenerative disc disease lumbar spine   Lumbar facet syndrome   Sacroiliac joint dysfunction   Degenerative disc disease cervical spine  C3-4 degenerative  changes with exiting nerve root compromise as well as compression at C5-6   Status post total hip replacement   Status post surgery of upper extremity    plan  Continue present medications  Neurontin and Effexor and oxycodone  F/U PCP for evaliation of  BP and general medical  condition.  F/U surgical evaluation. Patient will follow-up Dr. Tamala Julian for further evaluation of upper extremity  F/U neurological evaluation.  May consider radiofrequency rhizolysis or intraspinal procedures pending response to present treatment and F/U evaluation.  Patient to call Pain Management Center should patient have concerns prior to scheduled return appointment.

## 2015-03-13 NOTE — Progress Notes (Signed)
Patient discharged to home. Script given as ordered. teachback 3 done.

## 2015-03-13 NOTE — Therapy (Signed)
[facility] [address] [place], [address] Phone: [phone]   Fax:  [phone]  Occupational Therapy Treatment  Patient Details  Name: Yvonne Singleton MRN: [id number] Date of Birth: [birth year] Referring Provider:  Christophe Louis, MD  Encounter Date: 03/13/2015      OT End of Session - 03/13/15 1503    Visit Number 16   Number of Visits 24   Date for OT Re-Evaluation 04/10/15   Authorization Type Medicare - BCBS   OT Start Time 1320   OT Stop Time 1419   OT Time Calculation (min) 59 min   Activity Tolerance Patient tolerated treatment well   Behavior During Therapy [facility] for tasks assessed/performed      Past Medical History  Diagnosis Date  . Gout   . Parkinson's disease   . Back pain   . Cancer     breast  . Hypertension   . Hard of hearing   . Sleep apnea     Past Surgical History  Procedure Laterality Date  . Hip surgery    . Eye surgery Bilateral   . Cataract extraction Bilateral   . Joint replacement      bilateral hip  . Total knee arthroplasty Bilateral   . Abdominal hysterectomy    . Hand surgery      There were no vitals filed for this visit.  Visit Diagnosis:  Rheumatoid arthritis - Plan: Ot plan of care cert/re-cert  Stiffness of hand joint, left - Plan: Ot plan of care cert/re-cert      Subjective Assessment - 03/13/15 1339    Subjective  Sleeping still in splint - now alternating between hand and wrist splint - scar is better - using hand more - cannot do fist with holding wrist up -  I use my L hand to assist, bathing, uisng in dressing, using some what in kitcheing - STILL reaally weakn , cannot grip or hold ojbects with weight to  it   Patient Stated Goals Increase use of left hand for daily activities   Currently in Pain? Yes   Pain Score 1    Pain Location Hand   Pain Orientation Left   Pain Descriptors / Indicators Aching   Pain Onset More than a  month ago   Pain Frequency Constant            [facility] OT Assessment - 03/13/15 0001    AROM   Left Wrist Extension 24 Degrees   Left Wrist Flexion 44 Degrees   Left Wrist Radial Deviation 10 Degrees   Left Wrist Ulnar Deviation 28 Degrees   Strength   Right Hand Grip (lbs) 20   Right Hand Lateral Pinch 11 lbs   Right Hand 3 Point Pinch 11 lbs   Left Hand Grip (lbs) 6   Left Hand Lateral Pinch 6 lbs   Left Hand 3 Point Pinch 3 lbs   Right Hand AROM   R Index  MCP 0-90 75 Degrees   R Index PIP 0-100 88 Degrees   R Long  MCP 0-90 75 Degrees   R Long PIP 0-100 90 Degrees   R Ring  MCP 0-90 75 Degrees   R Ring PIP 0-100 90 Degrees   R Little  MCP 0-90 70 Degrees   R Little PIP 0-100 90 Degrees                  OT Treatments/Exercises (OP) - 03/13/15  0001    Wrist Exercises   Other wrist exercises RD stretch last 5 min in heat, AROM assist with bilateral on pillow in lap , add this date 1 kg ball 10 reps - unable to do with 1 lbs grasping - but larger object like jar better - add for HEP    Other wrist exercises Prayer stretch PROM - pt to use wrist extentio and normal ROM to bring hand up to prayer stretch, place and hold wrist extention open hand and loose close fist - place and hold  - assist to keep forearm down ,- worked on Patent attorney with 3 point grip putty and 2 lbs cloting pins   Hand Exercises   Other Hand Exercises gripping putty with hand down, lateral grip - needed min A, 2 point pinch alternate  digits  in table - add rolling putty keeping diigts in extention - gripping to do sideways position to get better composite flexion with MP's too   Other Hand Exercises upgrade to teal putty   LUE Paraffin   Number Minutes Paraffin 10 Minutes   LUE Paraffin Location Hand;Wrist   Comments Increase ROM for RD - keptstretch on and off for 5 min  prior to ther ex    Splinting   Splinting Pt to wear wrist splint at night time  andduring day try 3-4 hrs wrist  splint on and 1-2 hrs on hard long one                 OT Education - 03/13/15 1503    Education provided Yes   Education Details HEP and splint wearing    Person(s) Educated Patient   Methods Explanation;Demonstration;Tactile cues;Verbal cues   Comprehension Verbalized understanding;Returned demonstration;Verbal cues required;Tactile cues required          OT Short Term Goals - 03/13/15 1508    OT SHORT TERM GOAL #1   Title Decreased edema to w/in 2cm DPC, 0.5 PIP contralateral hand in 0-2 weeks   Status Achieved   OT SHORT TERM GOAL #2   Title PIP joint extension to 0, no flexion contracture   Status Achieved   OT SHORT TERM GOAL #3   Title Passive isolated digital flexionMPs to 50*, PIPs to 60*, DIPs to 20*, active hold exten at 0* all joints in 5 weeks   Status Achieved   OT SHORT TERM GOAL #4   Title No rupture or gapping at repair site, compliance w/ full time orthosis, exchange night and day orthoses, reproduce HEP w/o cues   Time 3   Period Weeks   Status On-going   OT SHORT TERM GOAL #5   Title Iincision scar remodled, radial sensory nerve, dorsal ulnar sensory nerve asymptomatic w/ stretch, tolerate light and deep touch w/ pain <2/10   Status Achieved           OT Long Term Goals - 03/13/15 1509    OT LONG TERM GOAL #1   Title Peri-tendonious adhesion remodled w/ no extension lag MP, PIP, DIP; active flexion 75% of contralateral digit, including thumb - allowing light functional got managing hygiene, ADL's (eating, utensils, holding a glass, toothbrush, hairbrush); open hand to grasp object of 4" diameter.   Status Achieved   OT LONG TERM GOAL #2   Title Grip to improve to 50% compared to contralateral hand to use in ADL's in 5-8 weeks   Time 4   Period Weeks   Status On-going   OT LONG TERM GOAL #  3   Title PRWHE for function improve to at least 15 points in 8 weeks   Status Achieved   OT LONG TERM GOAL #4   Title Pt able to maintian wrist to  neutral  during gripping and prehension grip in ADL's    Time 4   Period Weeks   Status New      Outcome measure - PRWHE for pain 11/50; function 13.5/50         Plan - April 01, 2015 1505    Clinical Impression Statement Pt is 9 wks post op from 3 zone extensor tendon repair - making great progress considering her arthritis , age, and complexity of injury - pt still mostly limiited by ECR weakness and getting over power by flexors - still busy weaning her out of splints to prevent extntion lag at wrist and to break her habit of letting her wrist hang,  pt to cont therapy to increase strength for 4 more wks and increase functional use    Pt will benefit from skilled therapeutic intervention in order to improve on the following deficits (Retired) Decreased range of motion;Decreased knowledge of precautions;Decreased scar mobility;Decreased strength;Increased edema;Impaired UE functional use;Pain;Impaired flexibility   Rehab Potential Good   OT Frequency 2x / week   OT Duration 4 weeks   OT Treatment/Interventions Self-care/ADL training;Ultrasound;Fluidtherapy;Parrafin;Therapeutic exercise;Scar mobilization;Passive range of motion;Therapeutic activities;Therapeutic exercises;Splinting;Patient/family education;Manual Therapy   OT Home Exercise Plan see pt instructiona    Consulted and Agree with Plan of Care Patient          G-Codes - 2015/04/01 1513    Functional Assessment Tool Used Clinical judgement; ROM and grip and prehension    Functional Limitation Self care   Self Care Current Status (U1314) At least 20 percent but less than 40 percent impaired, limited or restricted   Self Care Goal Status (H8887) At least 1 percent but less than 20 percent impaired, limited or restricted      Problem List Patient Active Problem List   Diagnosis Date Noted  . Rheumatoid arthritis 02/17/2015    Rosalyn Gess  OTR/L, CLT Apr 01, 2015, 3:17 PM  [facility] [address], [place], [address] Phone: [phone]   Fax:  [phone]

## 2015-03-13 NOTE — Progress Notes (Signed)
Safety precautions to be maintained throughout the outpatient stay will include: orient to surroundings, keep bed in low position, maintain call bell within reach at all times, provide assistance with transfer out of bed and ambulation.  

## 2015-03-20 ENCOUNTER — Ambulatory Visit: Payer: Medicare Other | Attending: Specialist | Admitting: Occupational Therapy

## 2015-03-20 DIAGNOSIS — M069 Rheumatoid arthritis, unspecified: Secondary | ICD-10-CM | POA: Diagnosis not present

## 2015-03-20 DIAGNOSIS — M25642 Stiffness of left hand, not elsewhere classified: Secondary | ICD-10-CM | POA: Diagnosis present

## 2015-03-20 NOTE — Patient Instructions (Signed)
Splnt wearing of new wrist splint - 1 hour off and 2-3 off during day - sleep with it on   Also ed pt and husband donning correctly

## 2015-03-20 NOTE — Therapy (Signed)
[facility] [address], [place], [address] Phone: [phone]   Fax:  [phone]  Occupational Therapy Treatment  Patient Details  Name: Yvonne Singleton MRN: [id number] Date of Birth: 1934-12-07 Referring Provider:  Christophe Louis, MD  Encounter Date: 03/20/2015      OT End of Session - 03/20/15 1417    Visit Number 17   Number of Visits 24   Date for OT Re-Evaluation 04/10/15   Authorization Type Medicare - BCBS   OT Start Time 1045   OT Stop Time 1147   OT Time Calculation (min) 62 min   Activity Tolerance Patient tolerated treatment well   Behavior During Therapy [facility] for tasks assessed/performed      Past Medical History  Diagnosis Date  . Gout   . Parkinson's disease   . Back pain   . Cancer     breast  . Hypertension   . Hard of hearing   . Sleep apnea     Past Surgical History  Procedure Laterality Date  . Hip surgery    . Eye surgery Bilateral   . Cataract extraction Bilateral   . Joint replacement      bilateral hip  . Total knee arthroplasty Bilateral   . Abdominal hysterectomy    . Hand surgery      There were no vitals filed for this visit.  Visit Diagnosis:  Rheumatoid arthritis  Stiffness of hand joint, left      Subjective Assessment - 03/20/15 1408    Subjective  I seen DR Tamala Julian - wanted to know how therapy was going - looked at few things - and gave me order to cont with you 4 wks - my arthritis pain more today - my fingers and then this prefab spllint not keeping my wrist straight - and after about 3 hrs I am ready to go into my hard one - wrist  tired    Patient is accompained by: Family member   Patient Stated Goals Increase use of left hand for daily activities   Currently in Pain? Yes   Pain Score 3    Pain Location Hand   Pain Orientation Left   Pain Descriptors / Indicators Aching   Pain Type Chronic pain   Pain Onset More than a month ago    Pain Frequency Constant                      OT Treatments/Exercises (OP) - 03/20/15 0001    Exercises   Exercises Hand;Wrist   Wrist Exercises   Other wrist exercises Wrist extention stretch in parafin , place and hold wrist extention open hand ,then graspiing light object - unable to pick up 1 lbs weight - wrist drops into flexion    Other wrist exercises 1kg ball for RD on lap bilateral hands , lifting and picking up several size light objects with trying maintaining wrist neutral    LUE Paraffin   Number Minutes Paraffin 10 Minutes   LUE Paraffin Location Hand;Wrist   Comments Increase wrist extention - hand on wedge and hand elevated into extention stretch with heatinpad    Splinting   Splinting Fabricated palmar wrist support splint - had to modify it with foam in palm and on ulnar side of hand in splint to get hand aligned in neutral - increase splint wearing to 1 hour off and 2-3 off - during day and sleep with it -  gives better alignment than prefab                 OT Education - 03/20/15 1416    Education provided Yes   Education Details HEP and splint wearing    Person(s) Educated Patient;Spouse   Methods Explanation;Demonstration;Verbal cues   Comprehension Verbalized understanding;Returned demonstration;Verbal cues required;Tactile cues required;Need further instruction          OT Short Term Goals - 03/13/15 1508    OT SHORT TERM GOAL #1   Title Decreased edema to w/in 2cm DPC, 0.5 PIP contralateral hand in 0-2 weeks   Status Achieved   OT SHORT TERM GOAL #2   Title PIP joint extension to 0, no flexion contracture   Status Achieved   OT SHORT TERM GOAL #3   Title Passive isolated digital flexionMPs to 50*, PIPs to 60*, DIPs to 20*, active hold exten at 0* all joints in 5 weeks   Status Achieved   OT SHORT TERM GOAL #4   Title No rupture or gapping at repair site, compliance w/ full time orthosis, exchange night and day orthoses,  reproduce HEP w/o cues   Time 3   Period Weeks   Status On-going   OT SHORT TERM GOAL #5   Title Iincision scar remodled, radial sensory nerve, dorsal ulnar sensory nerve asymptomatic w/ stretch, tolerate light and deep touch w/ pain <2/10   Status Achieved           OT Long Term Goals - 03/13/15 1509    OT LONG TERM GOAL #1   Title Peri-tendonious adhesion remodled w/ no extension lag MP, PIP, DIP; active flexion 75% of contralateral digit, including thumb - allowing light functional got managing hygiene, ADL's (eating, utensils, holding a glass, toothbrush, hairbrush); open hand to grasp object of 4" diameter.   Status Achieved   OT LONG TERM GOAL #2   Title Grip to improve to 50% compared to contralateral hand to use in ADL's in 5-8 weeks   Time 4   Period Weeks   Status On-going   OT LONG TERM GOAL #3   Title PRWHE for function improve to at least 15 points in 8 weeks   Status Achieved   OT LONG TERM GOAL #4   Title Pt able to maintian wrist to neutral  during gripping and prehension grip in ADL's    Time 4   Period Weeks   Status New               Plan - 03/20/15 1418    Clinical Impression Statement Pt return for MD appt with new order to cont 2 x wk for 4 wks - pt arrive with prefab wrist splint on and UD /flexing in splint at wrist - assess and fabricated pt palmar wrist support - with adjustments inside to align neutral the wrist - and increase  wearing time back to 2-3 hours on and 1 hout off - adn cont with same HEP - pt cont to be weak in wrist extention with grip - able to maintain if place and hold with extended digits or  liight digits flexion - cont to increase strenght and assess wear of splint    Pt will benefit from skilled therapeutic intervention in order to improve on the following deficits (Retired) Decreased range of motion;Decreased knowledge of precautions;Decreased scar mobility;Decreased strength;Increased edema;Impaired UE functional  use;Pain;Impaired flexibility   Rehab Potential Good   Clinical Impairments Affecting Rehab Potential arthritis  OT Frequency 2x / week   OT Duration 4 weeks   OT Treatment/Interventions Self-care/ADL training;Ultrasound;Fluidtherapy;Parrafin;Therapeutic exercise;Scar mobilization;Passive range of motion;Therapeutic activities;Therapeutic exercises;Splinting;Patient/family education;Manual Therapy   Plan splint fitting and wearing - as well as cont strengthening    OT Home Exercise Plan see pt instructiona    Consulted and Agree with Plan of Care Patient        Problem List Patient Active Problem List   Diagnosis Date Noted  . DDD (degenerative disc disease), lumbar 03/13/2015  . Lumbar post-laminectomy syndrome 03/13/2015  . Degenerative cervical disc 03/13/2015  . Status post bilateral total hip replacement 03/13/2015  . Sacroiliac joint dysfunction 03/13/2015  . History of surgery on upper extremity 03/13/2015  . DJD of shoulder 03/13/2015  . Rheumatoid arthritis 02/17/2015    Rosalyn Gess OTR/L ,CLT 03/20/2015, 2:51 PM  [facility] [address] [place], [address] Phone: [phone]   Fax:  [phone]

## 2015-03-24 ENCOUNTER — Ambulatory Visit: Payer: Medicare Other | Admitting: Occupational Therapy

## 2015-03-24 DIAGNOSIS — M069 Rheumatoid arthritis, unspecified: Secondary | ICD-10-CM | POA: Diagnosis not present

## 2015-03-24 DIAGNOSIS — M25642 Stiffness of left hand, not elsewhere classified: Secondary | ICD-10-CM

## 2015-03-24 NOTE — Patient Instructions (Signed)
Pt to do wrist extention rolling over ball into wrist extention keeping wrist NOT UD   Was unable to do it with loose fist

## 2015-03-24 NOTE — Therapy (Signed)
[facility] [address], [place], [address] Phone: [phone]   Fax:  [phone]  Occupational Therapy Treatment  Patient Details  Name: Yvonne Singleton MRN: [id number] Date of Birth: [birth year] Referring Provider:  Christophe Louis, MD  Encounter Date: 03/24/2015      OT End of Session - 03/24/15 1701    Visit Number 18   Number of Visits 24   Date for OT Re-Evaluation 04/10/15   Authorization Type Medicare - BCBS   OT Start Time 1355   OT Stop Time 1454   OT Time Calculation (min) 59 min   Activity Tolerance Patient tolerated treatment well   Behavior During Therapy [facility] for tasks assessed/performed      Past Medical History  Diagnosis Date  . Gout   . Parkinson's disease   . Back pain   . Cancer     breast  . Hypertension   . Hard of hearing   . Sleep apnea     Past Surgical History  Procedure Laterality Date  . Hip surgery    . Eye surgery Bilateral   . Cataract extraction Bilateral   . Joint replacement      bilateral hip  . Total knee arthroplasty Bilateral   . Abdominal hysterectomy    . Hand surgery      There were no vitals filed for this visit.  Visit Diagnosis:  Rheumatoid arthritis  Stiffness of hand joint, left      Subjective Assessment - 03/24/15 1644    Subjective  The splint doing okay  - my hand do not get so tired in this one you made - I don't know if I need to make appt to see Dr Etta Quill my arthrtis pain is really bad - about 6/10 in my hand - I tried to toughen it out - but it is getting to much  I thnk    Patient Stated Goals Increase use of left hand for daily activities   Pain Score 6    Pain Location Hand   Pain Orientation Right;Left   Pain Descriptors / Indicators Aching   Pain Type Chronic pain   Pain Onset More than a month ago   Multiple Pain Sites Yes                      OT Treatments/Exercises (OP) - 03/24/15 0001     Exercises   Exercises Hand;Wrist   Wrist Exercises   Other wrist exercises Wrist extention PROM , BTE screwdriver with grip and wrist extention 0lbs 102 sec x 2 , wrist extention open hand rolling into wrist and digits extention - was unable to maintain wrist extnetion with flexion of digits in midline ; add open hand rolling into extnetion to HEP    Other wrist exercises BTE RD with 1 lbs on large knob ; Maze done 3 x with only wrist extention in all planes at end of session    Additional Wrist Exercises   Theraputty - Pinch 3 point with wrist splint on - done with 5 lbs on BTE    LUE Paraffin   Number Minutes Paraffin 10 Minutes   LUE Paraffin Location Hand;Wrist   Comments At [facility] to decrease pain and increase wrist extention - done wrist extention stretch in parafin with heatingpad    Splinting   Splinting Assess fit of wrist splint - no redness and pt able to donn correctly - replace  on strap that was coming apart                OT Education - 03/24/15 1701    Education provided Yes   Person(s) Educated Patient   Methods Explanation;Demonstration   Comprehension Verbalized understanding;Returned demonstration;Verbal cues required          OT Short Term Goals - 03/24/15 1705    OT SHORT TERM GOAL #4   Title No rupture or gapping at repair site, compliance w/ full time orthosis, exchange night and day orthoses, reproduce HEP w/o cues   Time 3   Period Weeks   Status On-going           OT Long Term Goals - 03/24/15 1706    OT LONG TERM GOAL #2   Title Grip to improve to 50% compared to contralateral hand to use in ADL's in 5-8 weeks   Time 4   Period Weeks   Status On-going   OT LONG TERM GOAL #3   Title PRWHE for function improve to at least 15 points in 8 weeks   Status Achieved   OT LONG TERM GOAL #4   Title Pt able to maintian wrist to neutral  during gripping and prehension grip in ADL's    Time 4   Period Weeks   Status On-going                Plan - 03/24/15 1702    Clinical Impression Statement Pt made progress this date able to do on BTE grip with wrist extention combo , RD with large knob - but needed t/c and verbal cues 50% of time for not compensation and going into UD - wrist still deviating and flexing if at rest and not paying attentions    Pt will benefit from skilled therapeutic intervention in order to improve on the following deficits (Retired) Decreased range of motion;Decreased knowledge of precautions;Decreased scar mobility;Decreased strength;Increased edema;Impaired UE functional use;Pain;Impaired flexibility   Rehab Potential Good   Clinical Impairments Affecting Rehab Potential arthritis    OT Frequency 2x / week   OT Duration 4 weeks   Plan COnt with BTE and strenghtning without pt compensating   OT Home Exercise Plan see pt instructiona    Consulted and Agree with Plan of Care Patient        Problem List Patient Active Problem List   Diagnosis Date Noted  . DDD (degenerative disc disease), lumbar 03/13/2015  . Lumbar post-laminectomy syndrome 03/13/2015  . Degenerative cervical disc 03/13/2015  . Status post bilateral total hip replacement 03/13/2015  . Sacroiliac joint dysfunction 03/13/2015  . History of surgery on upper extremity 03/13/2015  . DJD of shoulder 03/13/2015  . Rheumatoid arthritis 02/17/2015    Rosalyn Gess OTR/L, CLT 03/24/2015, 5:08 PM  [facility] [address] [place], [address] Phone: [phone]   Fax:  [phone]

## 2015-03-27 ENCOUNTER — Ambulatory Visit: Payer: Medicare Other | Admitting: Occupational Therapy

## 2015-03-27 DIAGNOSIS — M25642 Stiffness of left hand, not elsewhere classified: Secondary | ICD-10-CM

## 2015-03-27 DIAGNOSIS — M069 Rheumatoid arthritis, unspecified: Secondary | ICD-10-CM | POA: Diagnosis not present

## 2015-03-27 NOTE — Patient Instructions (Signed)
Kinesiotape use on to assist ECR - pt had tape before and had no issues - pt verbalize precautions   Isometric strengthening for wrist extention with gravity assist   And can do 2 hrs on and off splint

## 2015-03-27 NOTE — Therapy (Signed)
[facility] [address] [place], [address] Phone: [phone]   Fax:  [phone]  Occupational Therapy Treatment  Patient Details  Name: Yvonne Singleton MRN: [id number] Date of Birth: [birth year] Referring Provider:  Christophe Louis, MD  Encounter Date: 03/27/2015      OT End of Session - 03/27/15 1659    Visit Number 19   Number of Visits 24   Date for OT Re-Evaluation 04/10/15   Authorization Type Medicare - BCBS   OT Start Time 1312   OT Stop Time 1355   OT Time Calculation (min) 43 min   Activity Tolerance Patient tolerated treatment well   Behavior During Therapy [facility] for tasks assessed/performed      Past Medical History  Diagnosis Date  . Gout   . Parkinson's disease   . Back pain   . Cancer     breast  . Hypertension   . Hard of hearing   . Sleep apnea     Past Surgical History  Procedure Laterality Date  . Hip surgery    . Eye surgery Bilateral   . Cataract extraction Bilateral   . Joint replacement      bilateral hip  . Total knee arthroplasty Bilateral   . Abdominal hysterectomy    . Hand surgery      There were no vitals filed for this visit.  Visit Diagnosis:  Rheumatoid arthritis  Stiffness of hand joint, left      Subjective Assessment - 03/27/15 1338    Subjective  I thhk I can use my hand little more and my thumb bending better - I did find ball to do that one exercise wth - the arthritis pain not to bad today - sorry late - my glasses broke   Patient is accompained by: Family member   Patient Stated Goals Increase use of left hand for daily activities   Currently in Pain? Yes   Pain Score 3    Pain Location Hand   Pain Orientation Right;Left   Pain Descriptors / Indicators Aching   Pain Type Chronic pain   Pain Onset More than a month ago   Pain Frequency Constant   Pain Score 2                      OT Treatments/Exercises (OP) -  03/27/15 0001    Exercises   Exercises Hand;Wrist   Wrist Exercises   Other wrist exercises Wrist extention PROM , BTE screwdriver with grip and wrist extention 1lbs 120 sec x 2 , wrist extention open hand rolling into wrist and digits extention - was unable to maintain wrist extnetion with flexion of digits in midline ; add open hand rolling into extnetion to HEP    Other wrist exercises Maze done 3 x without and then with /34 lbs for wrist AROM in all planes but pt encourage to use extention with gravity assist and add /done isometric strenthgning of wrist extention with gravity assist   LUE Paraffin   Number Minutes Paraffin 10 Minutes   LUE Paraffin Location Hand;Wrist   Comments Wrist in extention with heatinpad to increase wrist extention adn decrease pain and stiffness at [facility]    Manual Therapy   Manual Therapy Taping   Manual therapy comments Kinesio tape done for supporting weak ECR for support of wrist extention during 2 hrs at time out of splint - pt ed on precautions of tape  nad she verbalize understanding and had it before , for scar tissue wtihout any issues                 OT Education - 03/27/15 1659    Education provided Yes   Education Details HEP    Person(s) Educated Patient;Spouse   Methods Explanation;Demonstration;Tactile cues   Comprehension Verbalized understanding;Returned demonstration;Verbal cues required          OT Short Term Goals - 03/24/15 1705    OT SHORT TERM GOAL #4   Title No rupture or gapping at repair site, compliance w/ full time orthosis, exchange night and day orthoses, reproduce HEP w/o cues   Time 3   Period Weeks   Status On-going           OT Long Term Goals - 03/24/15 1706    OT LONG TERM GOAL #2   Title Grip to improve to 50% compared to contralateral hand to use in ADL's in 5-8 weeks   Time 4   Period Weeks   Status On-going   OT LONG TERM GOAL #3   Title PRWHE for function improve to at least 15 points in 8 weeks    Status Achieved   OT LONG TERM GOAL #4   Title Pt able to maintian wrist to neutral  during gripping and prehension grip in ADL's    Time 4   Period Weeks   Status On-going               Plan - 03/27/15 1701    Clinical Impression Statement Pt do show some progress in maintaining exention better and using L hand in light actvities - she do ulnar devait still - did apply kinestiotape this date to support ECU during times if splint is off - able to cont with strengtheing of BTE this date    Pt will benefit from skilled therapeutic intervention in order to improve on the following deficits (Retired) Decreased range of motion;Decreased knowledge of precautions;Decreased scar mobility;Decreased strength;Increased edema;Impaired UE functional use;Pain;Impaired flexibility   Rehab Potential Good   Clinical Impairments Affecting Rehab Potential arthritis    OT Frequency 2x / week   OT Duration 4 weeks   Plan cont strengthning and assess how she did with kinesiotape    OT Home Exercise Plan see pt instructiona    Consulted and Agree with Plan of Care Patient        Problem List Patient Active Problem List   Diagnosis Date Noted  . DDD (degenerative disc disease), lumbar 03/13/2015  . Lumbar post-laminectomy syndrome 03/13/2015  . Degenerative cervical disc 03/13/2015  . Status post bilateral total hip replacement 03/13/2015  . Sacroiliac joint dysfunction 03/13/2015  . History of surgery on upper extremity 03/13/2015  . DJD of shoulder 03/13/2015  . Rheumatoid arthritis 02/17/2015    Isela Stantz, MaureenOTR/L, CLT 03/27/2015, 5:05 PM  [facility] [address] [place], [address] Phone: [phone]   Fax:  [phone]

## 2015-03-31 ENCOUNTER — Ambulatory Visit: Payer: Medicare Other | Admitting: Occupational Therapy

## 2015-04-03 ENCOUNTER — Ambulatory Visit: Payer: Medicare Other | Admitting: Occupational Therapy

## 2015-04-03 DIAGNOSIS — M25642 Stiffness of left hand, not elsewhere classified: Secondary | ICD-10-CM

## 2015-04-03 DIAGNOSIS — M069 Rheumatoid arthritis, unspecified: Secondary | ICD-10-CM

## 2015-04-03 NOTE — Patient Instructions (Signed)
Upgrade putty to green - to do only lateral and 3 point - hold of on gripping   ROlling over putty for wrist extnention - into fist - not touching table   Kinesiotape for wrist extnetion

## 2015-04-03 NOTE — Therapy (Signed)
[place] [facility] [address], [place], [address] Phone: [phone]   Fax:  [phone]  Occupational Therapy Treatment  Patient Details  Name: Yvonne Singleton MRN: [id number] Date of Birth: 1935-08-12 Referring Provider:  Christophe Louis, MD  Encounter Date: 04/03/2015      OT End of Session - 04/03/15 1345    Visit Number 20   Number of Visits 24   Date for OT Re-Evaluation 04/10/15   Authorization Type Medicare - BCBS   OT Start Time 1300   OT Stop Time 1346   OT Time Calculation (min) 46 min   Activity Tolerance Patient tolerated treatment well   Behavior During Therapy [facility] for tasks assessed/performed      Past Medical History  Diagnosis Date  . Gout   . Parkinson's disease   . Back pain   . Cancer     breast  . Hypertension   . Hard of hearing   . Sleep apnea     Past Surgical History  Procedure Laterality Date  . Hip surgery    . Eye surgery Bilateral   . Cataract extraction Bilateral   . Joint replacement      bilateral hip  . Total knee arthroplasty Bilateral   . Abdominal hysterectomy    . Hand surgery      There were no vitals filed for this visit.  Visit Diagnosis:  Rheumatoid arthritis  Stiffness of hand joint, left      Subjective Assessment - 04/03/15 1313    Subjective  I gave in and see Dr Jefm Bryant - gave me steroids for the pain in my hands and feet - trying to use but pain limiting - tape helped I think - wrist was more supported - and did the splint 2hrs on and off    Patient Stated Goals Increase use of left hand for daily activities   Currently in Pain? Yes   Pain Score 5    Pain Location Hand   Pain Orientation Right;Left   Pain Descriptors / Indicators Aching   Pain Type Chronic pain   Pain Onset More than a month ago   Pain Frequency Constant            [facility] OT Assessment - 04/03/15 0001    Strength   Right Hand Grip (lbs) 15   Right  Hand Lateral Pinch 12 lbs   Right Hand 3 Point Pinch 7 lbs   Left Hand Grip (lbs) 10   Left Hand Lateral Pinch 6 lbs   Left Hand 3 Point Pinch 4 lbs                  OT Treatments/Exercises (OP) - 04/03/15 0001    Wrist Exercises   Other wrist exercises Wrist extention PROM , rolling over putty into extention with fist and back open harnd,    Other wrist exercises Wrist extention with screwdriver tool 160 sec 2 lbs    Hand Exercises   Other Hand Exercises Lat and 3 poitn with green putty - upgrade    Other Hand Exercises BTE for 3 point at 15 lbs and lat grip 16 lbs - 120 sec each     LUE Paraffin   Number Minutes Paraffin 10 Minutes   LUE Paraffin Location Hand;Wrist   Comments Wrist in extention with heatinpad to increase wrist extention    Splinting   Splinting splint still 3 hrs off and 2 hrs on  Manual Therapy   Manual Therapy Taping   Manual therapy comments Kinesio tape done for supporting weak ECR for support of wrist extention during 2 hrs at time out of splint - pt ed on precautions of tape nad she verbalize understanding and had it before , for scar tissue wtihout any issues                 OT Education - 04/03/15 1345    Education provided Yes   Education Details HEP   Person(s) Educated Patient   Methods Explanation;Demonstration;Tactile cues   Comprehension Verbalized understanding;Returned demonstration;Verbal cues required          OT Short Term Goals - 03/24/15 1705    OT SHORT TERM GOAL #4   Title No rupture or gapping at repair site, compliance w/ full time orthosis, exchange night and day orthoses, reproduce HEP w/o cues   Time 3   Period Weeks   Status On-going           OT Long Term Goals - 03/24/15 1706    OT LONG TERM GOAL #2   Title Grip to improve to 50% compared to contralateral hand to use in ADL's in 5-8 weeks   Time 4   Period Weeks   Status On-going   OT LONG TERM GOAL #3   Title PRWHE for function improve to  at least 15 points in 8 weeks   Status Achieved   OT LONG TERM GOAL #4   Title Pt able to maintian wrist to neutral  during gripping and prehension grip in ADL's    Time 4   Period Weeks   Status On-going               Plan - 04/03/15 1351    Clinical Impression Statement Pt show increase strenght in grip and 3 point - also able to carry light bag and putting down without dropping wrist into much flexion - pain limiting her she felt and seen DR Jefm Bryant this date and was put on steriods - pt to cont with prehension strenght and wrist extention as well as some taping for wrist extention j- out of splint 3-4 hrs and 2 hrs on    Pt will benefit from skilled therapeutic intervention in order to improve on the following deficits (Retired) Decreased range of motion;Decreased knowledge of precautions;Decreased scar mobility;Decreased strength;Increased edema;Impaired UE functional use;Pain;Impaired flexibility   Rehab Potential Good   Clinical Impairments Affecting Rehab Potential arthritis    OT Frequency 2x / week   OT Duration 4 weeks   OT Treatment/Interventions Self-care/ADL training;Ultrasound;Fluidtherapy;Parrafin;Therapeutic exercise;Scar mobilization;Passive range of motion;Therapeutic activities;Therapeutic exercises;Splinting;Patient/family education;Manual Therapy   OT Home Exercise Plan see pt instructiona    Consulted and Agree with Plan of Care Patient        Problem List Patient Active Problem List   Diagnosis Date Noted  . DDD (degenerative disc disease), lumbar 03/13/2015  . Lumbar post-laminectomy syndrome 03/13/2015  . Degenerative cervical disc 03/13/2015  . Status post bilateral total hip replacement 03/13/2015  . Sacroiliac joint dysfunction 03/13/2015  . History of surgery on upper extremity 03/13/2015  . DJD of shoulder 03/13/2015  . Rheumatoid arthritis 02/17/2015    Rosalyn Gess OTR/L, CLT  04/03/2015, 1:54 PM  [place] [facility] [address] [place], [address] Phone: [phone]   Fax:  [phone]

## 2015-04-07 ENCOUNTER — Ambulatory Visit: Payer: Medicare Other | Admitting: Occupational Therapy

## 2015-04-07 DIAGNOSIS — M069 Rheumatoid arthritis, unspecified: Secondary | ICD-10-CM

## 2015-04-07 DIAGNOSIS — M25642 Stiffness of left hand, not elsewhere classified: Secondary | ICD-10-CM

## 2015-04-07 NOTE — Therapy (Signed)
[place] [facility] [address] [place], [address] Phone: [phone]   Fax:  [phone]  Occupational Therapy Treatment  Patient Details  Name: Yvonne Singleton MRN: [id number] Date of Birth: 1935-09-23 Referring Provider:  Christophe Louis, MD  Encounter Date: 04/07/2015      OT End of Session - 04/07/15 2100    Visit Number 21   Number of Visits 24   Date for OT Re-Evaluation 04/10/15   Authorization Type Medicare - BCBS   OT Start Time 1300   OT Stop Time 1348   OT Time Calculation (min) 48 min   Activity Tolerance Patient tolerated treatment well   Behavior During Therapy [facility] for tasks assessed/performed      Past Medical History  Diagnosis Date  . Gout   . Parkinson's disease   . Back pain   . Cancer     breast  . Hypertension   . Hard of hearing   . Sleep apnea     Past Surgical History  Procedure Laterality Date  . Hip surgery    . Eye surgery Bilateral   . Cataract extraction Bilateral   . Joint replacement      bilateral hip  . Total knee arthroplasty Bilateral   . Abdominal hysterectomy    . Hand surgery      There were no vitals filed for this visit.  Visit Diagnosis:  Rheumatoid arthritis  Stiffness of hand joint, left      Subjective Assessment - 04/07/15 2055    Subjective  I found myself using my index and middle finger more - than my hand - I did try keep hard splint off for 3hrs but cannot do 4hrs -arm gets tired - no pain - meds working - I find I am still babing my hand - appt with MD Wed   Patient Stated Goals Increase use of left hand for daily activities   Currently in Pain? No/denies                      OT Treatments/Exercises (OP) - 04/07/15 0001    Hand Exercises   Other Hand Exercises Gripping with green putty , pulling with ulnar 3 digits, BTE 3 point and lat grip 17 lbs each 120 sec ,, Wrist RD turning 1 kg ball with elbow to side   2 x 15 reps    Other Hand Exercises BTE for large knob RD 120 sec, 1 lbs  screwdriver grip with wrist extention with elbow in to decrease UD 120 sec 2 lbs , gripper at 25 lbs 120 sec    LUE Paraffin   Number Minutes Paraffin 10 Minutes   LUE Paraffin Location Hand;Wrist   Comments Wrist in extention for stretch    Manual Therapy   Manual Therapy Taping   Manual therapy comments Kinesio tape done for supporting weak ECR for support of wrist extention during 2 hrs at time out of splint - pt ed on precautions of tape nad she verbalize understanding and had it before , for scar tissue wtihout any issues                 OT Education - 04/07/15 2059    Education provided Yes   Education Details HEP   Person(s) Educated Patient   Methods Explanation;Demonstration   Comprehension Verbalized understanding;Verbal cues required;Returned demonstration          OT Short Term Goals -  04/07/15 2104    OT SHORT TERM GOAL #1   Title Decreased edema to w/in 2cm DPC, 0.5 PIP contralateral hand in 0-2 weeks   Status Achieved   OT SHORT TERM GOAL #2   Title PIP joint extension to 0, no flexion contracture   Status Achieved   OT SHORT TERM GOAL #3   Title Passive isolated digital flexionMPs to 50*, PIPs to 60*, DIPs to 20*, active hold exten at 0* all joints in 5 weeks   Status Achieved   OT SHORT TERM GOAL #4   Title No rupture or gapping at repair site, compliance w/ full time orthosis, exchange night and day orthoses, reproduce HEP w/o cues   Time 3   Period Weeks   Status On-going   OT SHORT TERM GOAL #5   Title Iincision scar remodled, radial sensory nerve, dorsal ulnar sensory nerve asymptomatic w/ stretch, tolerate light and deep touch w/ pain <2/10   Status Achieved           OT Long Term Goals - 04/07/15 2105    OT LONG TERM GOAL #1   Title Peri-tendonious adhesion remodled w/ no extension lag MP, PIP, DIP; active flexion 75% of contralateral digit, including thumb -  allowing light functional got managing hygiene, ADL's (eating, utensils, holding a glass, toothbrush, hairbrush); open hand to grasp object of 4" diameter.   OT LONG TERM GOAL #2   Title Grip to improve to 50% compared to contralateral hand to use in ADL's in 5-8 weeks   Status Achieved   OT LONG TERM GOAL #3   Title PRWHE for function improve to at least 15 points in 8 weeks   Status Achieved   OT LONG TERM GOAL #4   Title Pt able to maintian wrist to neutral  during gripping and prehension grip in ADL's    Status On-going               Plan - 04/07/15 2100    Clinical Impression Statement Pt show increase functional use with picking up pillow and carrying her crutch but wrist do UD - and CMC of thumb enlarge- able to tolerate more strengthening - pt to use hand as much as she can prior to next appt to assess what she has trouble with - appt with MD Wed    Pt will benefit from skilled therapeutic intervention in order to improve on the following deficits (Retired) Decreased range of motion;Decreased knowledge of precautions;Decreased scar mobility;Decreased strength;Increased edema;Impaired UE functional use;Pain;Impaired flexibility   Rehab Potential Good   Clinical Impairments Affecting Rehab Potential arthritis    OT Frequency 2x / week   OT Duration Other (comment)   OT Treatment/Interventions Self-care/ADL training;Ultrasound;Fluidtherapy;Parrafin;Therapeutic exercise;Scar mobilization;Passive range of motion;Therapeutic activities;Therapeutic exercises;Splinting;Patient/family education;Manual Therapy   OT Home Exercise Plan see pt instructiona    Consulted and Agree with Plan of Care Patient        Problem List Patient Active Problem List   Diagnosis Date Noted  . DDD (degenerative disc disease), lumbar 03/13/2015  . Lumbar post-laminectomy syndrome 03/13/2015  . Degenerative cervical disc 03/13/2015  . Status post bilateral total hip replacement 03/13/2015  .  Sacroiliac joint dysfunction 03/13/2015  . History of surgery on upper extremity 03/13/2015  . DJD of shoulder 03/13/2015  . Rheumatoid arthritis 02/17/2015    Rosalyn Gess OTR/L, CLT 04/07/2015, 9:06 PM  [place] [facility] [address] [place], [address] Phone: [phone]  Fax:  [phone]

## 2015-04-07 NOTE — Patient Instructions (Signed)
Taping still , and need to try and use hand as much - to assess what trouble with and what easy - prior to appt with MD   Green putty add ulnar pull with hand into RD   Turning ball into RD on table - wrist extention

## 2015-04-09 ENCOUNTER — Ambulatory Visit: Payer: Medicare Other | Admitting: Occupational Therapy

## 2015-04-09 DIAGNOSIS — M25642 Stiffness of left hand, not elsewhere classified: Secondary | ICD-10-CM

## 2015-04-09 DIAGNOSIS — M069 Rheumatoid arthritis, unspecified: Secondary | ICD-10-CM

## 2015-04-09 NOTE — Patient Instructions (Signed)
Splint off in am for 4 hrs , and off after lunch again for 4-5 hrs - still sleeping with it  Putty and wrist extention as well as RD - cont HEP

## 2015-04-09 NOTE — Therapy (Signed)
[place] [facility] [address], [place], [address] Phone: [phone]   Fax:  [phone]  Occupational Therapy Treatment  Patient Details  Name: Yvonne Singleton MRN: [id number] Date of Birth: 11-05-1934 Referring Provider:  Christophe Louis, MD  Encounter Date: 04/09/2015      OT End of Session - 04/09/15 1510    Visit Number 22   Number of Visits 24   Date for OT Re-Evaluation 04/10/15   OT Start Time 1522   OT Stop Time 1500   OT Time Calculation (min) 1418 min   Activity Tolerance Patient tolerated treatment well   Behavior During Therapy [facility] for tasks assessed/performed      Past Medical History  Diagnosis Date  . Gout   . Parkinson's disease   . Back pain   . Cancer     breast  . Hypertension   . Hard of hearing   . Sleep apnea     Past Surgical History  Procedure Laterality Date  . Hip surgery    . Eye surgery Bilateral   . Cataract extraction Bilateral   . Joint replacement      bilateral hip  . Total knee arthroplasty Bilateral   . Abdominal hysterectomy    . Hand surgery      There were no vitals filed for this visit.  Visit Diagnosis:  Rheumatoid arthritis  Stiffness of hand joint, left      Subjective Assessment - 04/09/15 1508    Subjective  Seen MD-  he had me make fist, lift wrist up and touch finger tips - he said 3 more wks for therapy - I tried and use it more - think I am using it more- still splnt off 3 at time and still sleeping with it    Patient Stated Goals Increase use of left hand for daily activities   Currently in Pain? No/denies            [facility] OT Assessment - 04/09/15 0001    AROM   Left Wrist Extension 70 Degrees   Left Wrist Flexion 50 Degrees                  OT Treatments/Exercises (OP) - 04/09/15 0001    Wrist Exercises   Other wrist exercises Wrist ext with gripping  120 sec 3 lbs, RD large  knob 4 lbs 120 sec, 2 kg  ball twist into RD 20 reps , also wrist extention wtih palm up with 2 lbs 20 reps ,    Other wrist exercises 2 kg ball from palm to palm with L into supination and RD 20 reps    Hand Exercises   Other Hand Exercises Gripping with BTE 30 lbs, lateral and 3 point 22 lbs - elbow to side and wrist straight, thumb PROM flexion , opposition , picking up 2 kg ball , rolling into RD                 OT Education - 04/09/15 1509    Education provided Yes   Education Details HEP   Person(s) Educated Patient   Methods Explanation;Demonstration   Comprehension Verbalized understanding;Returned demonstration;Verbal cues required          OT Short Term Goals - 04/07/15 2104    OT SHORT TERM GOAL #1   Title Decreased edema to w/in 2cm DPC, 0.5 PIP contralateral hand in 0-2 weeks   Status Achieved   OT SHORT  TERM GOAL #2   Title PIP joint extension to 0, no flexion contracture   Status Achieved   OT SHORT TERM GOAL #3   Title Passive isolated digital flexionMPs to 50*, PIPs to 60*, DIPs to 20*, active hold exten at 0* all joints in 5 weeks   Status Achieved   OT SHORT TERM GOAL #4   Title No rupture or gapping at repair site, compliance w/ full time orthosis, exchange night and day orthoses, reproduce HEP w/o cues   Time 3   Period Weeks   Status On-going   OT SHORT TERM GOAL #5   Title Iincision scar remodled, radial sensory nerve, dorsal ulnar sensory nerve asymptomatic w/ stretch, tolerate light and deep touch w/ pain <2/10   Status Achieved           OT Long Term Goals - 04/07/15 2105    OT LONG TERM GOAL #1   Title Peri-tendonious adhesion remodled w/ no extension lag MP, PIP, DIP; active flexion 75% of contralateral digit, including thumb - allowing light functional got managing hygiene, ADL's (eating, utensils, holding a glass, toothbrush, hairbrush); open hand to grasp object of 4" diameter.   OT LONG TERM GOAL #2   Title Grip to improve to 50% compared to contralateral  hand to use in ADL's in 5-8 weeks   Status Achieved   OT LONG TERM GOAL #3   Title PRWHE for function improve to at least 15 points in 8 weeks   Status Achieved   OT LONG TERM GOAL #4   Title Pt able to maintian wrist to neutral  during gripping and prehension grip in ADL's    Status On-going               Plan - 04/09/15 1511    Clinical Impression Statement Observe pt holding cup with water , gesturing, adjusting glass, pick up with palm down 2kg ball- without dropping - still deviating into UD and flexion during pickup or holding but able to use it - increase time out of splint  rest of this week    Pt will benefit from skilled therapeutic intervention in order to improve on the following deficits (Retired) Decreased range of motion;Decreased knowledge of precautions;Decreased scar mobility;Decreased strength;Increased edema;Impaired UE functional use;Pain;Impaired flexibility   Rehab Potential Good   Clinical Impairments Affecting Rehab Potential arthritis    OT Frequency 2x / week   OT Duration Other (comment)   OT Treatment/Interventions Self-care/ADL training;Ultrasound;Fluidtherapy;Parrafin;Therapeutic exercise;Scar mobilization;Passive range of motion;Therapeutic activities;Therapeutic exercises;Splinting;Patient/family education;Manual Therapy   OT Home Exercise Plan see pt instructiona    Consulted and Agree with Plan of Care Patient        Problem List Patient Active Problem List   Diagnosis Date Noted  . DDD (degenerative disc disease), lumbar 03/13/2015  . Lumbar post-laminectomy syndrome 03/13/2015  . Degenerative cervical disc 03/13/2015  . Status post bilateral total hip replacement 03/13/2015  . Sacroiliac joint dysfunction 03/13/2015  . History of surgery on upper extremity 03/13/2015  . DJD of shoulder 03/13/2015  . Rheumatoid arthritis 02/17/2015    Rosalyn Gess OTR/L, CLT 04/09/2015, 3:16 PM  [place]  [facility] [address] [place], [address] Phone: [phone]   Fax:  [phone]

## 2015-04-11 ENCOUNTER — Encounter: Payer: Medicare Other | Admitting: Occupational Therapy

## 2015-04-14 ENCOUNTER — Ambulatory Visit: Payer: Medicare Other | Admitting: Occupational Therapy

## 2015-04-14 DIAGNOSIS — M25642 Stiffness of left hand, not elsewhere classified: Secondary | ICD-10-CM

## 2015-04-14 DIAGNOSIS — M069 Rheumatoid arthritis, unspecified: Secondary | ICD-10-CM

## 2015-04-14 NOTE — Therapy (Signed)
[facility] [address] [place], [address] Phone: [phone]   Fax:  [phone]  Occupational Therapy Treatment  Patient Details  Name: Yvonne Singleton MRN: [id number] Date of Birth: 01/13/1935 Referring Provider:  Christophe Louis, MD  Encounter Date: 04/14/2015      OT End of Session - 04/14/15 1346    Visit Number 23   Number of Visits 26   Date for OT Re-Evaluation 04/28/15   Authorization Type Medicare - BCBS   OT Start Time 1255   OT Stop Time 1345   OT Time Calculation (min) 50 min   Activity Tolerance Patient tolerated treatment well   Behavior During Therapy [facility] for tasks assessed/performed      Past Medical History  Diagnosis Date  . Gout   . Parkinson's disease   . Back pain   . Cancer     breast  . Hypertension   . Hard of hearing   . Sleep apnea     Past Surgical History  Procedure Laterality Date  . Hip surgery    . Eye surgery Bilateral   . Cataract extraction Bilateral   . Joint replacement      bilateral hip  . Total knee arthroplasty Bilateral   . Abdominal hysterectomy    . Hand surgery      There were no vitals filed for this visit.  Visit Diagnosis:  Rheumatoid arthritis - Plan: Ot plan of care cert/re-cert  Stiffness of hand joint, left - Plan: Ot plan of care cert/re-cert      Subjective Assessment - 04/14/15 1259    Subjective  [place] - did not do to much my exercises - pain is about 4-5/10 in my fingers - but I am using it more - did kept splint off for am  and pm with putting on for 2 hrs over lunch    Patient Stated Goals Increase use of left hand for daily activities   Currently in Pain? Yes   Pain Score 4    Pain Location Hand   Pain Orientation Left;Right   Pain Descriptors / Indicators Aching   Pain Type Chronic pain   Pain Onset More than a month ago   Pain Frequency Constant                       OT Treatments/Exercises (OP) - 04/14/15 0001    Wrist Exercises   Other wrist exercises Wrist RD with 1kg ball 20reps , large knob 4 lbs 120 sec RD vertical plane;x 2 sup D ring 6 lbs 120 sec    Other wrist exercises  maze awith 3/4 lbs 5 x using wrist in all planes    Hand Exercises   Other Hand Exercises Gripper BTE 30 lbs x 2 , lat and 3 point grip 22 lbs - 120 sec each    Other Hand Exercises Ball 1 kg RD on table 20 reps    LUE Paraffin   Number Minutes Paraffin 10 Minutes   LUE Paraffin Location Hand;Wrist   Comments Wrist in extnetion and fist with heatinpad to increaes extention and decrease pain                 OT Education - 04/14/15 1346    Education provided Yes   Person(s) Educated Patient   Methods Explanation;Demonstration   Comprehension Verbalized understanding;Verbal cues required  OT Short Term Goals - 26-Apr-2015 1303    OT SHORT TERM GOAL #1   Title Decreased edema to w/in 2cm DPC, 0.5 PIP contralateral hand in 0-2 weeks   Status Achieved   OT SHORT TERM GOAL #2   Title PIP joint extension to 0, no flexion contracture   Status Achieved   OT SHORT TERM GOAL #3   Title Passive isolated digital flexionMPs to 50*, PIPs to 60*, DIPs to 20*, active hold exten at 0* all joints in 5 weeks   Status Achieved   OT SHORT TERM GOAL #4   Title No rupture or gapping at repair site, compliance w/ full time orthosis, exchange night and day orthoses, reproduce HEP w/o cues   Status On-going   OT SHORT TERM GOAL #5   Title Iincision scar remodled, radial sensory nerve, dorsal ulnar sensory nerve asymptomatic w/ stretch, tolerate light and deep touch w/ pain <2/10   Status Achieved           OT Long Term Goals - 04-26-15 1302    OT LONG TERM GOAL #1   Title Peri-tendonious adhesion remodled w/ no extension lag MP, PIP, DIP; active flexion 75% of contralateral digit, including thumb - allowing light functional got  managing hygiene, ADL's (eating, utensils, holding a glass, toothbrush, hairbrush); open hand to grasp object of 4" diameter.   Status Achieved   OT LONG TERM GOAL #2   Title Grip to improve to 50% compared to contralateral hand to use in ADL's in 5-8 weeks   Status Achieved   OT LONG TERM GOAL #3   Title PRWHE for function improve to at least 15 points in 8 weeks   OT LONG TERM GOAL #4   Title Pt able to maintian wrist to neutral  during gripping and prehension grip in ADL's    Time 2   Period Weeks   Status On-going               Plan - 04/26/15 1346    Clinical Impression Statement Pt making great gains in using hand - still ulnar deviation of wrist but could be her arthritis - she can pick up 4lbs to carry or hold - and show increase grip - weainging her this week out of splint at night time  - 2 x this week and then 1 x wk for 2 wks    Pt will benefit from skilled therapeutic intervention in order to improve on the following deficits (Retired) Decreased range of motion;Decreased knowledge of precautions;Decreased scar mobility;Decreased strength;Increased edema;Impaired UE functional use;Pain;Impaired flexibility   Rehab Potential Good   Clinical Impairments Affecting Rehab Potential arthritis    OT Frequency 1x / week   OT Duration Other (comment)   OT Treatment/Interventions Self-care/ADL training;Ultrasound;Fluidtherapy;Parrafin;Therapeutic exercise;Scar mobilization;Passive range of motion;Therapeutic activities;Therapeutic exercises;Splinting;Patient/family education;Manual Therapy   OT Home Exercise Plan see pt instructiona    Consulted and Agree with Plan of Care Patient          G-Codes - April 26, 2015 1349    Functional Assessment Tool Used Clinical judgement; ROM and grip and prehension    Self Care Current Status (Y6063) At least 1 percent but less than 20 percent impaired, limited or restricted   Self Care Goal Status (K1601) At least 1 percent but less than 20  percent impaired, limited or restricted      Problem List Patient Active Problem List   Diagnosis Date Noted  . DDD (degenerative disc disease), lumbar 03/13/2015  .  Lumbar post-laminectomy syndrome 03/13/2015  . Degenerative cervical disc 03/13/2015  . Status post bilateral total hip replacement 03/13/2015  . Sacroiliac joint dysfunction 03/13/2015  . History of surgery on upper extremity 03/13/2015  . DJD of shoulder 03/13/2015  . Rheumatoid arthritis 02/17/2015    Rosalyn Gess OTR/L, CLT 04/14/2015, 1:52 PM  [facility] [address] [place], [address] Phone: [phone]   Fax:  [phone]

## 2015-04-14 NOTE — Patient Instructions (Signed)
Splint only on at lunch and dinner for hour or 2  Sleep without splint

## 2015-04-15 ENCOUNTER — Ambulatory Visit: Payer: Medicare Other | Attending: Pain Medicine | Admitting: Pain Medicine

## 2015-04-15 ENCOUNTER — Encounter: Payer: Self-pay | Admitting: Pain Medicine

## 2015-04-15 VITALS — BP 113/60 | HR 60 | Temp 98.5°F | Resp 18 | Ht 63.0 in | Wt 182.0 lb

## 2015-04-15 DIAGNOSIS — M533 Sacrococcygeal disorders, not elsewhere classified: Secondary | ICD-10-CM | POA: Diagnosis not present

## 2015-04-15 DIAGNOSIS — M069 Rheumatoid arthritis, unspecified: Secondary | ICD-10-CM

## 2015-04-15 DIAGNOSIS — M961 Postlaminectomy syndrome, not elsewhere classified: Secondary | ICD-10-CM

## 2015-04-15 DIAGNOSIS — M25511 Pain in right shoulder: Secondary | ICD-10-CM | POA: Diagnosis present

## 2015-04-15 DIAGNOSIS — Z96649 Presence of unspecified artificial hip joint: Secondary | ICD-10-CM | POA: Insufficient documentation

## 2015-04-15 DIAGNOSIS — M5136 Other intervertebral disc degeneration, lumbar region: Secondary | ICD-10-CM

## 2015-04-15 DIAGNOSIS — M5030 Other cervical disc degeneration, unspecified cervical region: Secondary | ICD-10-CM

## 2015-04-15 DIAGNOSIS — M19011 Primary osteoarthritis, right shoulder: Secondary | ICD-10-CM

## 2015-04-15 DIAGNOSIS — M19019 Primary osteoarthritis, unspecified shoulder: Secondary | ICD-10-CM | POA: Diagnosis not present

## 2015-04-15 DIAGNOSIS — Z9889 Other specified postprocedural states: Secondary | ICD-10-CM

## 2015-04-15 DIAGNOSIS — Z96643 Presence of artificial hip joint, bilateral: Secondary | ICD-10-CM

## 2015-04-15 DIAGNOSIS — M47816 Spondylosis without myelopathy or radiculopathy, lumbar region: Secondary | ICD-10-CM | POA: Insufficient documentation

## 2015-04-15 DIAGNOSIS — M25512 Pain in left shoulder: Secondary | ICD-10-CM | POA: Diagnosis present

## 2015-04-15 MED ORDER — OXYCODONE HCL 5 MG PO CAPS: ORAL_CAPSULE | ORAL | Status: DC

## 2015-04-15 MED ORDER — VENLAFAXINE HCL ER 75 MG PO CP24: 75.0000 mg | ORAL_CAPSULE | Freq: Every day | ORAL | Status: DC

## 2015-04-15 MED ORDER — GABAPENTIN 100 MG PO CAPS: 100.0000 mg | ORAL_CAPSULE | Freq: Every day | ORAL | Status: DC

## 2015-04-15 NOTE — Progress Notes (Signed)
Discharge patient home ambulatory at 1142hrs Teach back done 3 scripts given for oxycodone and gabapentin and effexor

## 2015-04-15 NOTE — Progress Notes (Signed)
   Subjective:    Patient ID: Armenta Erskin, female    DOB: [birth year], 80 y.o.   MRN: [id number]  HPI  Patient is 80 year old female returns to [facility] for further evaluation and treatment of pain involving the shoulders entire back and lower extremity regions. Patient states the pain is fairly well-controlled at this time. Patient is status post surgical intervention of the left upper extremity and states that she plans to wait until the fall to undergo surgical intervention of the right upper extremity. We discussed patient's condition and will continue medications as prescribed this time. Patient denies any trauma change in events of daily living the call significant change in symptomatology. Understanding and in agreement status treatment plan  Review of Systems     Objective:   Physical Exam there was tenderness of the splenius capitis and occipitalis musculature regions of mild degree. There was moderate tenderness of the acromioclavicular glenohumeral joint region. There was limited range of motion of the shoulder noted. The patient was with significant decrease of grip strength. Tinel and Phalen's maneuver without increased pain. There was tennis over the thoracic facet thoracic paraspinal musculature region with no crepitus of the thoracic region noted. There was mild muscle spasms of the lower lumbar paraspinal musculature region. Palpation of the PSIS and PII S region reproduced mild to moderate discomfort. There was mild to moderate tends to palpation of the greater trochanteric region and along the iliotibial band region. Palpation of the PSIS and PII S region was a tends to palpation of mild degree. No sensory deficit of dermatomal distribution detected. Clonus negative Homans. Abdomen nontender and no costovertebral angle tenderness noted.      Assessment & Plan:  Degenerative changes lumbar spine L2-3, L3-4 degenerative changes with postoperative changes  noted bilaterally, multilevel degenerative changes noted throughout the lumbar spine with neural foraminal narrowing and disc herniations as well  Status post total hip replacement  DJD of shoulder  Status post upper extremity surgery  Sacroiliac joint dysfunction   Plan    Continue present medications Effexor Neurontin and oxycodone   F/U PCP Dr. Wolfgang Phoenix  for evaliation of  BP and general medical  condition.  F/U surgical evaluation with Dr. Tamala Julian as planned F/U neurological evaluation  May consider radiofrequency rhizolysis or intraspinal procedures pending response to present treatment and F/U evaluation.  Patient to call [facility] should patient have concerns prior to scheduled return appointment.

## 2015-04-15 NOTE — Patient Instructions (Addendum)
Continue present medications Neurontin Effexor and oxycodone  F/U PCP for evaliation of  BP and general medical  condition.. Follow-up Dr.N Humphrey Rolls regarding lower extremity swelling as discussed  F/U surgical evaluation as discussed and surgery of the right upper extremity later this year as planned  F/U neurological evaluation.  May consider radiofrequency rhizolysis or intraspinal procedures pending response to present treatment and F/U evaluation.  Patient to call [facility] should patient have concerns prior to scheduled return appointment.

## 2015-04-15 NOTE — Progress Notes (Signed)
Safety precautions to be maintained throughout the outpatient stay will include: orient to surroundings, keep bed in low position, maintain call bell within reach at all times, provide assistance with transfer out of bed and ambulation.  

## 2015-04-17 ENCOUNTER — Ambulatory Visit: Payer: Medicare Other | Admitting: Occupational Therapy

## 2015-04-23 ENCOUNTER — Ambulatory Visit: Payer: Medicare Other | Admitting: Occupational Therapy

## 2015-04-24 ENCOUNTER — Emergency Department: Payer: Medicare Other

## 2015-04-24 ENCOUNTER — Emergency Department
Admission: EM | Admit: 2015-04-24 | Discharge: 2015-04-24 | Disposition: A | Discharge status: Home / Self Care | Payer: Medicare Other | Attending: Emergency Medicine | Admitting: Emergency Medicine

## 2015-04-24 ENCOUNTER — Encounter: Payer: Self-pay | Admitting: Emergency Medicine

## 2015-04-24 DIAGNOSIS — Y998 Other external cause status: Secondary | ICD-10-CM | POA: Diagnosis not present

## 2015-04-24 DIAGNOSIS — T84021A Dislocation of internal left hip prosthesis, initial encounter: Secondary | ICD-10-CM | POA: Diagnosis not present

## 2015-04-24 DIAGNOSIS — Z7982 Long term (current) use of aspirin: Secondary | ICD-10-CM | POA: Diagnosis not present

## 2015-04-24 DIAGNOSIS — S79912A Unspecified injury of left hip, initial encounter: Secondary | ICD-10-CM | POA: Diagnosis present

## 2015-04-24 DIAGNOSIS — Y9389 Activity, other specified: Secondary | ICD-10-CM | POA: Diagnosis not present

## 2015-04-24 DIAGNOSIS — X58XXXA Exposure to other specified factors, initial encounter: Secondary | ICD-10-CM | POA: Diagnosis not present

## 2015-04-24 DIAGNOSIS — Z79899 Other long term (current) drug therapy: Secondary | ICD-10-CM | POA: Diagnosis not present

## 2015-04-24 DIAGNOSIS — S73005A Unspecified dislocation of left hip, initial encounter: Secondary | ICD-10-CM

## 2015-04-24 DIAGNOSIS — Y9289 Other specified places as the place of occurrence of the external cause: Secondary | ICD-10-CM | POA: Insufficient documentation

## 2015-04-24 DIAGNOSIS — S80212A Abrasion, left knee, initial encounter: Secondary | ICD-10-CM | POA: Diagnosis not present

## 2015-04-24 LAB — BASIC METABOLIC PANEL
Anion gap: 9 (ref 5–15)
BUN: 37 mg/dL — ABNORMAL HIGH (ref 6–20)
CALCIUM: 9.5 mg/dL (ref 8.9–10.3)
CO2: 27 mmol/L (ref 22–32)
Chloride: 100 mmol/L — ABNORMAL LOW (ref 101–111)
Creatinine, Ser: 1.24 mg/dL — ABNORMAL HIGH (ref 0.44–1.00)
GFR calc Af Amer: 47 mL/min — ABNORMAL LOW (ref >60)
GFR calc non Af Amer: 41 mL/min — ABNORMAL LOW (ref >60)
Glucose, Bld: 98 mg/dL (ref 65–99)
Potassium: 4 mmol/L (ref 3.5–5.1)
SODIUM: 136 mmol/L (ref 135–145)

## 2015-04-24 LAB — CBC WITH DIFFERENTIAL/PLATELET
Basophils Absolute: 0 10*3/uL (ref 0–0.1)
Basophils Relative: 0 %
EOS ABS: 0.1 10*3/uL (ref 0–0.7)
EOS PCT: 1 %
HCT: 40.1 % (ref 35.0–47.0)
Hemoglobin: 13.1 g/dL (ref 12.0–16.0)
LYMPHS PCT: 16 %
Lymphs Abs: 1.4 10*3/uL (ref 1.0–3.6)
MCH: 31.7 pg (ref 26.0–34.0)
MCHC: 32.8 g/dL (ref 32.0–36.0)
MCV: 96.6 fL (ref 80.0–100.0)
Monocytes Absolute: 0.6 10*3/uL (ref 0.2–0.9)
Monocytes Relative: 6 %
Neutro Abs: 6.9 10*3/uL — ABNORMAL HIGH (ref 1.4–6.5)
Neutrophils Relative %: 77 %
Platelets: 236 10*3/uL (ref 150–440)
RBC: 4.15 MIL/uL (ref 3.80–5.20)
RDW: 13.4 % (ref 11.5–14.5)
WBC: 9 10*3/uL (ref 3.6–11.0)

## 2015-04-24 MED ORDER — FENTANYL CITRATE (PF) 100 MCG/2ML IJ SOLN
100.0000 ug | Freq: Once | INTRAMUSCULAR | Status: AC
  Administered 2015-04-24: 100 ug via INTRAVENOUS
  Filled 2015-04-24: qty 2

## 2015-04-24 MED ORDER — PROPOFOL 1000 MG/100ML IV EMUL
INTRAVENOUS | Status: AC
  Filled 2015-04-24: qty 100

## 2015-04-24 MED ORDER — MORPHINE SULFATE 4 MG/ML IJ SOLN
4.0000 mg | Freq: Once | INTRAMUSCULAR | Status: AC
  Administered 2015-04-24: 4 mg via INTRAVENOUS
  Filled 2015-04-24: qty 1

## 2015-04-24 MED ORDER — PROPOFOL 10 MG/ML IV BOLUS
INTRAVENOUS | Status: AC | PRN
  Administered 2015-04-24: 40 mg via INTRAVENOUS

## 2015-04-24 NOTE — Consult Note (Signed)
ORTHOPAEDIC CONSULTATION  REQUESTING PHYSICIAN: Ponciano Ort, MD  Chief Complaint:   Left hip pain secondary to posterior prosthetic hip dislocation.  History of Present Illness: Yvonne Singleton is a 80 y.o. female who is status post a left total hip arthroplasty in approximately 1999. This was revised about 5 years ago by Dr. Marry Guan. Since the revision, the patient has had 2 posterior hip dislocations, each of which were reduced closed under IV sedation. The last dislocation occurred in April 2015. The patient had been doing well until she bent over to feed her dog this evening when she felt her hip pop out of socket. She lay on the floor for about 30 minutes calling for her husband before he came. She was brought to the emergency room where x-rays demonstrated the above-noted injury. The patient denies any associated injuries, and denies any lightheadedness, dizziness, chest pain, shortness of breath, or other symptoms that may have precipitated the dislocation event. The patient lives at home with her husband and is quite active. She is status post a right total hip arthroplasty, as well as bilateral total knee arthroplasties.  Past Medical History  Diagnosis Date  . Gout   . Parkinson's disease   . Back pain   . Cancer     breast  . Hypertension   . Hard of hearing   . Sleep apnea    Past Surgical History  Procedure Laterality Date  . Hip surgery    . Eye surgery Bilateral   . Cataract extraction Bilateral   . Joint replacement      bilateral hip  . Total knee arthroplasty Bilateral   . Abdominal hysterectomy    . Hand surgery     History   Social History  . Marital Status: Married    Spouse Name: N/A  . Number of Children: N/A  . Years of Education: N/A   Social History Main Topics  . Smoking status: Never Smoker   . Smokeless tobacco: Not on file  . Alcohol Use: No  . Drug Use: No  . Sexual  Activity: Not on file   Other Topics Concern  . None   Social History Narrative   Family History  Problem Relation Age of Onset  . Heart disease Mother   . Arthritis Mother   . Heart disease Father   . Ulcers Father    Allergies  Allergen Reactions  . Iodinated Diagnostic Agents Hives  . Streptomycin Hives   Prior to Admission medications   Medication Sig Start Date End Date Taking? Authorizing Provider  alendronate (FOSAMAX) 70 MG tablet Take 70 mg by mouth once a week. Take with a full glass of water on an empty stomach.   Yes Historical Provider, MD  allopurinol (ZYLOPRIM) 100 MG tablet Take 100 mg by mouth daily. 2 tablets daily   Yes Historical Provider, MD  aspirin 81 MG tablet Take 81 mg by mouth daily.   Yes Historical Provider, MD  Calcium Carb-Cholecalciferol 600-200 MG-UNIT TABS Take 1 tablet by mouth daily.    Yes Historical Provider, MD  carbidopa-levodopa (SINEMET CR) 50-200 MG per tablet Take 1 tablet by mouth daily.    Yes Historical Provider, MD  conjugated estrogens (PREMARIN) vaginal cream Place 1 Applicatorful vaginally daily. 2 times per week   Yes Historical Provider, MD  folic acid (FOLVITE) 563 MCG tablet Take 400 mcg by mouth daily.   Yes Historical Provider, MD  furosemide (LASIX) 20 MG tablet Take 40 mg by mouth daily.  Yes Historical Provider, MD  gabapentin (NEURONTIN) 100 MG capsule Take 1 capsule (100 mg total) by mouth daily. 04/15/15  Yes Mohammed Kindle, MD  hydroxychloroquine (PLAQUENIL) 200 MG tablet Take 200 mg by mouth daily.   Yes Historical Provider, MD  losartan (COZAAR) 100 MG tablet Take 100 mg by mouth daily.    Yes Historical Provider, MD  mirabegron ER (MYRBETRIQ) 50 MG TB24 tablet Take 50 mg by mouth daily.   Yes Historical Provider, MD  oxycodone (OXY-IR) 5 MG capsule Limit 5-7 tabs by mouth per day if tolerated 04/15/15  Yes Mohammed Kindle, MD  venlafaxine XR (EFFEXOR-XR) 75 MG 24 hr capsule Take 1 capsule (75 mg total) by mouth daily  with breakfast. 04/15/15  Yes Mohammed Kindle, MD   Dg Chest 1 View  04/24/2015   CLINICAL DATA:  Left hip dislocation.  Pre operative exam.  EXAM: CHEST  1 VIEW  COMPARISON:  Chest x-ray dated 01/24/2014  FINDINGS: Heart size and pulmonary vascularity are normal and the lungs are clear. Chronic slight elevation of the right hemidiaphragm. Large chronic hiatal hernia.  No acute osseous abnormality. Degenerative changes of both shoulders and in the thoracic spine.  IMPRESSION: No acute abnormality.  Large hiatal hernia.   Electronically Signed   By: Lorriane Shire M.D.   On: 04/24/2015 19:09   Dg Hip Unilat With Pelvis 2-3 Views Left  04/24/2015   CLINICAL DATA:  Left hip pain following a fall today.  EXAM: LEFT HIP (WITH PELVIS) 2-3 VIEWS  COMPARISON:  None.  FINDINGS: Bilateral bipolar hip prostheses with superior dislocation of the femoral component on the left. No fracture seen. Lower lumbar spine degenerative changes and scoliosis.  IMPRESSION: Superior dislocation of the femoral component of the left hip prosthesis.  These results will be called to the ordering clinician or representative by the Radiologist Assistant, and communication documented in the PACS or zVision Dashboard.   Electronically Signed   By: Claudie Revering M.D.   On: 04/24/2015 19:09    Positive ROS: All other systems have been reviewed and were otherwise negative with the exception of those mentioned in the HPI and as above.  Physical Exam: General:  Alert, no acute distress Psychiatric:  Patient is competent for consent with normal mood and affect   Cardiovascular:  No pedal edema Respiratory:  No wheezing, non-labored breathing GI:  Abdomen is soft and non-tender Skin:  No lesions in the area of chief complaint Neurologic:  Sensation intact distally Lymphatic:  No axillary or cervical lymphadenopathy  Orthopedic Exam:  On examination, we have a pleasant elderly female resting comfortably in bed. She is alert and oriented 3.  Orthopedic examination is limited to the left hip and lower extremity. Skin inspection is notable for a well-healed surgical incision over the lateral aspect of the hip. There is no swelling, erythema, or other abnormalities. The hip and leg are kept in a somewhat flexed adductor tendon and internally rotated position and the leg is demonstrated with a shorter as compared to the right, all of which are consistent with a posterior hip dislocation. She is neurovascularly intact to her left foot and lower extremity. After reduction, she remains neurovascularly intact to the left lower extremity.  X-rays:  X-rays of the pelvis and left hip demonstrate a posterior dislocation of the left prosthetic hip. Both the acetabulum and femoral components appear to be well fixed and in satisfactory position. No fractures, lytic lesions, or other acute pathology is noted.  Assessment: Status  post recurrent prosthetic hip dislocation left hip.  Plan: The treatment options were discussed with the patient and her family. After obtaining verbal consent, the patient's prosthetic hip is reduced using traction and countertraction under IV sedation. After reduction, the leg was placed into a knee immobilizer to reduce the likelihood of recurrent dislocation. A postoperative portable AP pelvis is obtained to verify the adequacy of reduction.  The patient may be discharged home. She is instructed to continue to wear the knee immobilizer at all times, removing it only for bathing purposes. She is to follow-up in 2-3 weeks with Dr. Marry Guan, her orthopedic surgeon.  Thank you for ask me to participate in the care of this most pleasant woman.    Pascal Lux, MD  Beeper #:  [phone]  04/24/2015 8:22 PM

## 2015-04-24 NOTE — ED Notes (Signed)
Ems pt from home , bent and twisted lifting a dog bag and felt a pop , pt has hip replacement to same hip , with positive dislocations in the past x2 . Pt received 121mcg of fentanyl by EMS

## 2015-04-24 NOTE — ED Provider Notes (Signed)
[facility] Emergency Department Provider Note ____________________________________________  Time seen: Approximately 6:00 PM  I have reviewed the triage vital signs and the nursing notes.   HISTORY  Chief Complaint Hip Pain  HPI Yvonne Singleton is a 80 y.o. female history of rheumatoid arthritis, osteoarthritis, left hip replacement in '99 and revised approximately 5 years ago who presents feeling like her left hip is out of joint after she stepped down one stair and bent over to get dog food. This is approximately the third time she has dislocated this hip, last episode 2-3 years ago. She did not fall or injure any other body part. She had been in her usual state of good health prior to this. She last ate at 1 PM. She received fentanyl 100 g IV on route.  No recent illness.  Past Medical History  Diagnosis Date  . Gout   . Parkinson's disease   . Back pain   . Cancer     breast  . Hypertension   . Hard of hearing   . Sleep apnea     Patient Active Problem List   Diagnosis Date Noted  . DDD (degenerative disc disease), lumbar 03/13/2015  . Lumbar post-laminectomy syndrome 03/13/2015  . Degenerative cervical disc 03/13/2015  . Status post bilateral total hip replacement 03/13/2015  . Sacroiliac joint dysfunction 03/13/2015  . History of surgery on upper extremity 03/13/2015  . DJD of shoulder 03/13/2015  . Rheumatoid arthritis 02/17/2015    Past Surgical History  Procedure Laterality Date  . Hip surgery    . Eye surgery Bilateral   . Cataract extraction Bilateral   . Joint replacement      bilateral hip  . Total knee arthroplasty Bilateral   . Abdominal hysterectomy    . Hand surgery      Current Outpatient Rx  Name  Route  Sig  Dispense  Refill  . alendronate (FOSAMAX) 70 MG tablet   Oral   Take 70 mg by mouth once a week. Take with a full glass of water on an empty stomach.         Marland Kitchen allopurinol (ZYLOPRIM) 100 MG tablet    Oral   Take 100 mg by mouth daily. 2 tablets daily         . aspirin 81 MG tablet   Oral   Take 81 mg by mouth daily.         . Calcium Carb-Cholecalciferol 600-200 MG-UNIT TABS   Oral   Take 1 tablet by mouth daily.          . carbidopa-levodopa (SINEMET CR) 50-200 MG per tablet   Oral   Take 1 tablet by mouth daily.          Marland Kitchen conjugated estrogens (PREMARIN) vaginal cream   Vaginal   Place 1 Applicatorful vaginally daily. 2 times per week         . folic acid (FOLVITE) 440 MCG tablet   Oral   Take 400 mcg by mouth daily.         . furosemide (LASIX) 20 MG tablet   Oral   Take 40 mg by mouth daily.          Marland Kitchen gabapentin (NEURONTIN) 100 MG capsule   Oral   Take 1 capsule (100 mg total) by mouth daily.   90 capsule   0   . hydroxychloroquine (PLAQUENIL) 200 MG tablet   Oral   Take 200 mg by mouth daily.         Marland Kitchen  losartan (COZAAR) 100 MG tablet   Oral   Take 100 mg by mouth 2 (two) times daily.          . mirabegron ER (MYRBETRIQ) 50 MG TB24 tablet   Oral   Take 50 mg by mouth daily.         Marland Kitchen oxycodone (OXY-IR) 5 MG capsule      Limit 5-7 tabs by mouth per day if tolerated   210 capsule   0   . venlafaxine XR (EFFEXOR-XR) 75 MG 24 hr capsule   Oral   Take 1 capsule (75 mg total) by mouth daily with breakfast.   90 capsule   0     Allergies Iodinated diagnostic agents and Streptomycin  Family History  Problem Relation Age of Onset  . Heart disease Mother   . Arthritis Mother   . Heart disease Father   . Ulcers Father     Social History History  Substance Use Topics  . Smoking status: Never Smoker   . Smokeless tobacco: Not on file  . Alcohol Use: No    Review of Systems Constitutional: No fever/chills. ENT: No URI Cardiovascular: Denies chest pain. Respiratory: Denies shortness of breath. Gastrointestinal: No abdominal pain.   Musculoskeletal: Negative for back pain. Skin: Negative for rash. Neurological:  Negative for headaches 10-point ROS otherwise negative.  ____________________________________________   PHYSICAL EXAM:  VITAL SIGNS: ED Triage Vitals  Enc Vitals Group     BP 04/24/15 1813 126/66 mmHg     Pulse Rate 04/24/15 1813 74     Resp 04/24/15 1813 18     Temp 04/24/15 1813 98.2 F (36.8 C)     Temp Source 04/24/15 1813 Oral     SpO2 04/24/15 1813 96 %     Weight 04/24/15 1808 190 lb (86.183 kg)     Height 04/24/15 1808 5\' 3"  (1.6 m)     Head Cir --      Peak Flow --      Pain Score 04/24/15 1809 4     Pain Loc --      Pain Edu? --      Excl. in [place]? --    Constitutional: Alert and oriented. Well appearing and in no acute distress. Eyes: Conjunctivae are normal. PERRL. EOMI. Head: Atraumatic. Nose: No congestion/rhinnorhea. Mouth/Throat: Mucous membranes are moist.  Oropharynx non-erythematous. Neck: No stridor.   Lymphatic: No cervical lymphadenopathy. Cardiovascular: Normal rate, regular rhythm. Grossly normal heart sounds.  Peripheral pulses 2+ B PT and DP Respiratory: Normal respiratory effort.  No retractions. Lungs CTAB. Gastrointestinal: Soft and nontender. No distention. Normal bowel sounds.  Musculoskeletal: Left hip is flexed and externally rotated with left knee bent; scar over left knee consistent with left TKA.  Neurologic:  Normal speech and language. No gross focal neurologic deficits are appreciated. Speech is normal.  Skin:  Skin is warm, dry and intact. No rash noted. Psychiatric: Mood and affect are normal. Speech and behavior are normal.  ____________________________________________   LABS (all labs ordered are listed, but only abnormal results are displayed)  Labs Reviewed  BASIC METABOLIC PANEL  CBC WITH DIFFERENTIAL/PLATELET   ____________________________________________ ____________________________________________  RADIOLOGY  L hip-reviewed by me at 7p; official read at  7:15pm ____________________________________________   PROCEDURES  Procedure(s) performed: Procedural sedation Performed by: Ponciano Ort Consent: Verbal consent obtained. Risks and benefits: risks, benefits and alternatives were discussed Required items: required blood products, implants, devices, and special equipment available Patient identity confirmed: arm band and  provided demographic data Time out: Immediately prior to procedure a "time out" was called to verify the correct patient, procedure, equipment, support staff and site/side marked as required.  Sedation type: moderate (conscious) sedation NPO time confirmed and considedered  Sedatives: Propofol  Physician Time at Bedside: [id number]  Vitals: Vital signs were monitored during sedation. Cardiac Monitor, pulse oximeter Patient tolerance: Patient tolerated the procedure well with no immediate complications. Comments: Pt with uneventful recovered. Returned to pre-procedural sedation baseline  Critical Care performed: none ____________________________________________   INITIAL IMPRESSION / ASSESSMENT AND PLAN / ED COURSE  Pertinent labs & imaging results that were available during my care of the patient were reviewed by me and considered in my medical decision making (see chart for details).  ----------------------------------------- 7:00PM on 04/24/2015 ----------------------------------------- I d/w Dr. Roland Rack, orthopedics, who will perform closed reduction L hip in ER.  I will perform procedural sedation.  I consented pt with her husband and son, Shanon Brow at bedside for procedural sedation.    9:20  PM Patient is now awake, alert, and comfortable. Takes Roxicodone for her back and will be able to take this when she gets home as needed for her hip.  ____________________________________________   FINAL CLINICAL IMPRESSION(S) / ED DIAGNOSES L hip dislocation     Ponciano Ort, MD 04/24/15 2125

## 2015-04-24 NOTE — Discharge Instructions (Addendum)
Keep the immobilizer on at all times, removing it only for bathing purposes. May weight-bear as tolerated on the left lower extremity in the knee immobilizer. Use walker or cane as necessary for balance.  Take over-the-counter medications as necessary for discomfort. Please arrange follow-up with Dr. Marry Guan in 2-3 weeks.  Hip Dislocation Hip dislocation is the displacement of the "ball" at the head of your thigh bone (femur) from its socket in the hip bone (pelvis). The ball-and-socket structure of the hip joint gives it a lot of stability, while allowing it to move freely. Therefore, a lot of force is required to displace the femur from its socket. A hip dislocation is an emergency. If you believe you have dislocated your hip and cannot move your leg, call for help immediately. Do not try to move. CAUSES The most common cause of hip dislocation is motor vehicle accidents. However, force from falls from a height (a ladder or building), injuries from contact sports, or injuries from industrial accidents can be enough to dislocate your hip. SYMPTOMS A hip dislocation is very painful. If you have a dislocated hip, you will not be able to move your hip. If you have nerve damage, you may not have feeling in your lower leg, foot, or ankle.  DIAGNOSIS Usually, your caregiver can diagnose a hip dislocation by looking at the position of your leg. Generally, X-ray exams are done to check for fractures in your femur or pelvis. The leg of the dislocated hip will appear shorter than the other leg, and your foot will be turned inward. TREATMENT  Your caregiver can manipulate your bones back into the joint (reduction). If there are no other complications involved with your dislocation, such as fractures or damage to blood vessels or nerves, this procedure can be done without surgery. Before this procedure, you will be given medicine so that you will not feel pain (anesthetic). Often specialized imaging exams are  done after the reduction (magnetic resonance imaging [MRI] or computed tomography [CT]) to check for loose pieces of cartilage or bone in the joint. If a manual reduction fails or you have nerve damage, damage to your blood vessels, or bone fractures, surgery will be necessary to perform the reduction.  HOME CARE INSTRUCTIONS The following measures can help to reduce pain and speed up the healing process:  Rest your injured joint. Do not move your joint if it is painful. Also, avoid activities similar to the one that caused your injury.  Apply ice to your injured joint for 1 to 2 days after your reduction or as directed by your caregiver. Applying ice helps to reduce inflammation and pain.  Put ice in a plastic bag.  Place a towel between your skin and the bag.  Leave the ice on for 15 to 20 minutes at a time, every 2 hours while you are awake.  Use crutches or a walker as directed by your caregiver.  Exercise your hip and leg as directed by your caregiver.  Take over-the-counter or prescription medicine for pain as directed by your caregiver. SEEK IMMEDIATE MEDICAL CARE IF:  Your pain becomes worse rather than better.  You feel like your hip has become dislocated again. MAKE SURE YOU:  Understand these instructions.  Will watch your condition.  Will get help right away if you are not doing well or get worse. Document Released: 06/29/2001 Document Revised: 12/27/2011 Document Reviewed: 03/04/2011 [facility] Patient Information 2015 [place], [place]. This information is not intended to replace advice given to  you by your health care provider. Make sure you discuss any questions you have with your health care provider.

## 2015-04-25 ENCOUNTER — Encounter: Payer: Medicare Other | Admitting: Occupational Therapy

## 2015-04-25 ENCOUNTER — Other Ambulatory Visit: Payer: Self-pay | Admitting: Pain Medicine

## 2015-04-28 ENCOUNTER — Ambulatory Visit: Payer: Medicare Other | Admitting: Occupational Therapy

## 2015-05-15 ENCOUNTER — Encounter: Payer: Self-pay | Admitting: Pain Medicine

## 2015-05-15 ENCOUNTER — Ambulatory Visit: Payer: Medicare Other | Attending: Pain Medicine | Admitting: Pain Medicine

## 2015-05-15 VITALS — BP 114/64 | HR 68 | Temp 98.7°F | Resp 18 | Ht 63.5 in | Wt 180.0 lb

## 2015-05-15 DIAGNOSIS — M19012 Primary osteoarthritis, left shoulder: Secondary | ICD-10-CM

## 2015-05-15 DIAGNOSIS — M47896 Other spondylosis, lumbar region: Secondary | ICD-10-CM | POA: Insufficient documentation

## 2015-05-15 DIAGNOSIS — Z9889 Other specified postprocedural states: Secondary | ICD-10-CM | POA: Diagnosis not present

## 2015-05-15 DIAGNOSIS — M5136 Other intervertebral disc degeneration, lumbar region: Secondary | ICD-10-CM

## 2015-05-15 DIAGNOSIS — M961 Postlaminectomy syndrome, not elsewhere classified: Secondary | ICD-10-CM

## 2015-05-15 DIAGNOSIS — M19019 Primary osteoarthritis, unspecified shoulder: Secondary | ICD-10-CM | POA: Diagnosis not present

## 2015-05-15 DIAGNOSIS — M25511 Pain in right shoulder: Secondary | ICD-10-CM | POA: Diagnosis present

## 2015-05-15 DIAGNOSIS — M5030 Other cervical disc degeneration, unspecified cervical region: Secondary | ICD-10-CM

## 2015-05-15 DIAGNOSIS — Z96643 Presence of artificial hip joint, bilateral: Secondary | ICD-10-CM

## 2015-05-15 DIAGNOSIS — M533 Sacrococcygeal disorders, not elsewhere classified: Secondary | ICD-10-CM

## 2015-05-15 DIAGNOSIS — M19011 Primary osteoarthritis, right shoulder: Secondary | ICD-10-CM

## 2015-05-15 DIAGNOSIS — M4806 Spinal stenosis, lumbar region: Secondary | ICD-10-CM | POA: Insufficient documentation

## 2015-05-15 DIAGNOSIS — M069 Rheumatoid arthritis, unspecified: Secondary | ICD-10-CM

## 2015-05-15 DIAGNOSIS — M25512 Pain in left shoulder: Secondary | ICD-10-CM | POA: Diagnosis present

## 2015-05-15 DIAGNOSIS — M545 Low back pain: Secondary | ICD-10-CM | POA: Diagnosis present

## 2015-05-15 MED ORDER — GABAPENTIN 100 MG PO CAPS: 100.0000 mg | ORAL_CAPSULE | Freq: Every day | ORAL | Status: DC

## 2015-05-15 MED ORDER — VENLAFAXINE HCL ER 75 MG PO CP24: 75.0000 mg | ORAL_CAPSULE | Freq: Every day | ORAL | Status: DC

## 2015-05-15 MED ORDER — OXYCODONE HCL 5 MG PO CAPS: ORAL_CAPSULE | ORAL | Status: DC

## 2015-05-15 NOTE — Patient Instructions (Addendum)
Continue present medications Neurontin  Effexor and oxycodone   Lumbar epidural steroid injection to be performed at time of return appointment. Please see Dr. Wolfgang Phoenix to discuss your having a lumbar epidural steroid injection and consideration of your general medical condition as we discussed today  F/U PCP Dr Wolfgang Phoenix  for evaliation of  BP and general medical  condition.  F/U surgical evaluation. Follow-up with Dr. Marry Guan regarding your hip as planned   F/U neurological evaluation  May consider radiofrequency rhizolysis or intraspinal procedures pending response to present treatment and F/U evaluation.  Patient to call Pain Management Center should patient have concerns prior to scheduled return appointment. Pain Management Discharge Instructions  General Discharge Instructions :  If you need to reach your doctor call: Monday-Friday 8:00 am - 4:00 pm at [phone] or toll free [phone].  After clinic hours [phone] to have operator reach doctor.  Bring all of your medication bottles to all your appointments in the pain clinic.  To cancel or reschedule your appointment with Pain Management please remember to call 24 hours in advance to avoid a fee.  Refer to the educational materials which you have been given on: General Risks, I had my Procedure. Discharge Instructions, Post Sedation.  Post Procedure Instructions:  The drugs you were given will stay in your system until tomorrow, so for the next 24 hours you should not drive, make any legal decisions or drink any alcoholic beverages.  You may eat anything you prefer, but it is better to start with liquids then soups and crackers, and gradually work up to solid foods.  Please notify your doctor immediately if you have any unusual bleeding, trouble breathing or pain that is not related to your normal pain.  Depending on the type of procedure that was done, some parts of your body may feel week and/or numb.  This usually  clears up by tonight or the next day.  Walk with the use of an assistive device or accompanied by an adult for the 24 hours.  You may use ice on the affected area for the first 24 hours.  Put ice in a Ziploc bag and cover with a towel and place against area 15 minutes on 15 minutes off.  You may switch to heat after 24 hours.Epidural Steroid Injection Patient Information  Description: The epidural space surrounds the nerves as they exit the spinal cord.  In some patients, the nerves can be compressed and inflamed by a bulging disc or a tight spinal canal (spinal stenosis).  By injecting steroids into the epidural space, we can bring irritated nerves into direct contact with a potentially helpful medication.  These steroids act directly on the irritated nerves and can reduce swelling and inflammation which often leads to decreased pain.  Epidural steroids may be injected anywhere along the spine and from the neck to the low back depending upon the location of your pain.   After numbing the skin with local anesthetic (like Novocaine), a small needle is passed into the epidural space slowly.  You may experience a sensation of pressure while this is being done.  The entire block usually last less than 10 minutes.  Conditions which may be treated by epidural steroids:   Low back and leg pain  Neck and arm pain  Spinal stenosis  Post-laminectomy syndrome  Herpes zoster (shingles) pain  Pain from compression fractures  Preparation for the injection:  1. Do not eat any solid food or dairy products within 6  hours of your appointment.  2. You may drink clear liquids up to 2 hours before appointment.  Clear liquids include water, black coffee, juice or soda.  No milk or cream please. 3. You may take your regular medication, including pain medications, with a sip of water before your appointment  Diabetics should hold regular insulin (if taken separately) and take 1/2 normal NPH dos the morning of  the procedure.  Carry some sugar containing items with you to your appointment. 4. A driver must accompany you and be prepared to drive you home after your procedure.  5. Bring all your current medications with your. 6. An IV may be inserted and sedation may be given at the discretion of the physician.   7. A blood pressure cuff, EKG and other monitors will often be applied during the procedure.  Some patients may need to have extra oxygen administered for a short period. 8. You will be asked to provide medical information, including your allergies, prior to the procedure.  We must know immediately if you are taking blood thinners (like Coumadin/Warfarin)  Or if you are allergic to IV iodine contrast (dye). We must know if you could possible be pregnant.  Possible side-effects:  Bleeding from needle site  Infection (rare, may require surgery)  Nerve injury (rare)  Numbness & tingling (temporary)  Difficulty urinating (rare, temporary)  Spinal headache ( a headache worse with upright posture)  Light -headedness (temporary)  Pain at injection site (several days)  Decreased blood pressure (temporary)  Weakness in arm/leg (temporary)  Pressure sensation in back/neck (temporary)  Call if you experience:  Fever/chills associated with headache or increased back/neck pain.  Headache worsened by an upright position.  New onset weakness or numbness of an extremity below the injection site  Hives or difficulty breathing (go to the emergency room)  Inflammation or drainage at the infection site  Severe back/neck pain  Any new symptoms which are concerning to you  Please note:  Although the local anesthetic injected can often make your back or neck feel good for several hours after the injection, the pain will likely return.  It takes 3-7 days for steroids to work in the epidural space.  You may not notice any pain relief for at least that one week.  If effective, we will often  do a series of three injections spaced 3-6 weeks apart to maximally decrease your pain.  After the initial series, we generally will wait several months before considering a repeat injection of the same type.  If you have any questions, please call [phone] [facility]

## 2015-05-15 NOTE — Progress Notes (Signed)
Subjective:    Patient ID: Yvonne Singleton, female    DOB: 1935-02-12, 80 y.o.   MRN: [id number]  HPI  Patient is 80 year old female returns to [place] for further evaluation and treatment of pain involving the lower back lower extremity region as well as the shoulders upper extremity regions. Patient states that she dislocated her left total hip replacement began after a fall. Patient states that she notes some numbness of the lower extremity following her fall and also is with concern regarding numbness with questionable weakness causing patient to fall and dislocate her total hip replacement on the left. We discussed patient's condition with patient on today's visit. We discussed updating lumbar MRI as well as proceeding with lumbar epidural steroid injection. The patient will follow-up with  Dr. Wolfgang Phoenix to discuss patient being medically cleared to undergo lumbar epidural steroid injection. We will proceed with lumbar epidural steroid injection if patient is cleared for the procedure. We also will consider updating lumbar MRI as well as surgical reevaluation which we discussed on today's visit. The patient was understanding and will follow-up with Dr. kneeling contact this time and we will remain available to proceed with treatment as discussed. The patient will follow-up with Dr. Marry Guan for evaluation of her displaced hip at this time as well     Review of Systems     Objective:   Physical Exam   There was tenderness of the spleen scapula and occipitalis region of mild degree. Mild tenderness of the acromioclavicular glenohumeral joint regions. Patient was with decreased grip strength with well-healed scars of the upper extremities without increased warmth and erythema of the upper extremities noted. There was unremarkable Spurling's maneuver. Palpation over the thoracic facet thoracic paraspinal musculature is reproduced pain of mild degree. No crepitus of the thoracic  region was noted. Tinel and Phalen's maneuver were without increased pain of moderate degree. Palpation over the lumbar paraspinal muscles region lumbar facet region was with moderate tends to palpation left greater than the right. There was tenderness of the PSIS and PII S region of moderate degree. Straight leg raising was tolerates approximately 20 without a definite increased pain with dorsiflexion noted there was questionable decreased EHL strength. There was question decreased sensation 5 dermatomal distribution. There was negative clonus negative Homans. 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 Wolfgang Phoenix   F/U PCP Dr. Wolfgang Phoenix for evaliation of BP and general medical condition.  F/U surgical evaluation with Dr. Tamala Julian as planned  F/U surgical evaluation with Dr. Marry Guan   F/U neurological evaluation  May consider radiofrequency rhizolysis or intraspinal procedures pending response to present treatment and F/U evaluation.  Patient to call Pain Management Center should patient have concerns prior to  scheduled return appointment.

## 2015-05-15 NOTE — Progress Notes (Signed)
Safety precautions to be maintained throughout the outpatient stay will include: orient to surroundings, keep bed in low position, maintain call bell within reach at all times, provide assistance with transfer out of bed and ambulation.  

## 2015-05-15 NOTE — Progress Notes (Signed)
Discharged to home ambulatory with script on hand for oxycodone and gabapentin and venlaxafine.

## 2015-05-26 ENCOUNTER — Ambulatory Visit: Payer: Medicare Other | Admitting: Pain Medicine

## 2015-05-28 ENCOUNTER — Encounter: Payer: Self-pay | Admitting: Pain Medicine

## 2015-05-28 ENCOUNTER — Ambulatory Visit: Payer: Medicare Other | Attending: Pain Medicine | Admitting: Pain Medicine

## 2015-05-28 VITALS — BP 118/44 | HR 60 | Temp 99.0°F | Resp 16 | Ht 63.0 in | Wt 178.0 lb

## 2015-05-28 DIAGNOSIS — M545 Low back pain: Secondary | ICD-10-CM | POA: Diagnosis present

## 2015-05-28 DIAGNOSIS — M19012 Primary osteoarthritis, left shoulder: Secondary | ICD-10-CM

## 2015-05-28 DIAGNOSIS — Z9889 Other specified postprocedural states: Secondary | ICD-10-CM

## 2015-05-28 DIAGNOSIS — M51369 Other intervertebral disc degeneration, lumbar region without mention of lumbar back pain or lower extremity pain: Secondary | ICD-10-CM

## 2015-05-28 DIAGNOSIS — M533 Sacrococcygeal disorders, not elsewhere classified: Secondary | ICD-10-CM

## 2015-05-28 DIAGNOSIS — M79604 Pain in right leg: Secondary | ICD-10-CM | POA: Diagnosis present

## 2015-05-28 DIAGNOSIS — M069 Rheumatoid arthritis, unspecified: Secondary | ICD-10-CM

## 2015-05-28 DIAGNOSIS — M19011 Primary osteoarthritis, right shoulder: Secondary | ICD-10-CM

## 2015-05-28 DIAGNOSIS — M961 Postlaminectomy syndrome, not elsewhere classified: Secondary | ICD-10-CM

## 2015-05-28 DIAGNOSIS — M47816 Spondylosis without myelopathy or radiculopathy, lumbar region: Secondary | ICD-10-CM | POA: Insufficient documentation

## 2015-05-28 DIAGNOSIS — M5136 Other intervertebral disc degeneration, lumbar region: Secondary | ICD-10-CM

## 2015-05-28 DIAGNOSIS — M48062 Spinal stenosis, lumbar region with neurogenic claudication: Secondary | ICD-10-CM

## 2015-05-28 DIAGNOSIS — Z96643 Presence of artificial hip joint, bilateral: Secondary | ICD-10-CM | POA: Insufficient documentation

## 2015-05-28 DIAGNOSIS — M5030 Other cervical disc degeneration, unspecified cervical region: Secondary | ICD-10-CM

## 2015-05-28 DIAGNOSIS — M79605 Pain in left leg: Secondary | ICD-10-CM | POA: Diagnosis present

## 2015-05-28 MED ORDER — MIDAZOLAM HCL 5 MG/5ML IJ SOLN
INTRAMUSCULAR | Status: AC
  Administered 2015-05-28: 2 mg via INTRAVENOUS
  Filled 2015-05-28: qty 5

## 2015-05-28 MED ORDER — CEFUROXIME AXETIL 250 MG PO TABS: 250.0000 mg | ORAL_TABLET | Freq: Two times a day (BID) | ORAL | Status: DC

## 2015-05-28 MED ORDER — BUPIVACAINE HCL (PF) 0.25 % IJ SOLN
INTRAMUSCULAR | Status: AC
  Filled 2015-05-28: qty 30

## 2015-05-28 MED ORDER — FENTANYL CITRATE (PF) 100 MCG/2ML IJ SOLN
INTRAMUSCULAR | Status: AC
  Administered 2015-05-28: 50 ug via INTRAVENOUS
  Filled 2015-05-28: qty 2

## 2015-05-28 MED ORDER — TRIAMCINOLONE ACETONIDE 40 MG/ML IJ SUSP
INTRAMUSCULAR | Status: AC
  Administered 2015-05-28: 40 mg
  Filled 2015-05-28: qty 1

## 2015-05-28 MED ORDER — CEFAZOLIN SODIUM 1 G IJ SOLR
INTRAMUSCULAR | Status: AC
  Administered 2015-05-28: 1 g via INTRAVENOUS
  Filled 2015-05-28: qty 10

## 2015-05-28 MED ORDER — SODIUM CHLORIDE 0.9 % IJ SOLN
INTRAMUSCULAR | Status: AC
  Administered 2015-05-28: 20 mL
  Filled 2015-05-28: qty 20

## 2015-05-28 MED ORDER — LIDOCAINE HCL (PF) 1 % IJ SOLN
INTRAMUSCULAR | Status: AC
  Administered 2015-05-28: 3 mL
  Filled 2015-05-28: qty 5

## 2015-05-28 MED ORDER — ORPHENADRINE CITRATE 30 MG/ML IJ SOLN
INTRAMUSCULAR | Status: AC
  Filled 2015-05-28: qty 2

## 2015-05-28 NOTE — Progress Notes (Signed)
   Subjective:    Patient ID: Yvonne Singleton, female    DOB: [birth year], 80 y.o.   MRN: [id number]  HPI  PROCEDURE PERFORMED: Lumbar epidural steroid injection   NOTE: The patient is a 80 y.o. female who returns to [place] for further evaluation and treatment of pain involving the lumbar and lower extremity region. MRI revealed the patient to be with degenerative changes lumbar spineL2-3, L3-4 degenerative changes with postoperative changes noted bilaterally, multilevel degenerative changes noted throughout the lumbar spine with neural foraminal narrowing and disc herniations as well. The risks, benefits, and expectations of the procedure have been discussed and explained to the patient who was understanding and in agreement with suggested treatment plan. We will proceed with lumbar epidural steroid injection as discussed and as explained to the patient who is willing to proceed with procedure as planned.   DESCRIPTION OF PROCEDURE: Lumbar epidural steroid injection with IV Versed, IV fentanyl conscious sedation, EKG, blood pressure, pulse, and pulse oximetry monitoring. The procedure was performed with the patient in the prone position under fluoroscopic guidance. A local anesthetic skin wheal of 1.5% plain lidocaine was accomplished at proposed entry site. An 18-gauge Tuohy epidural needle was inserted at the L 4  vertebral body level right of the midline via loss-of-resistance technique with negative heme and negative CSF return. A total of 4 mL of Preservative-Free normal saline with 40 mg of Kenalog injected incrementally via epidurally placed needle. Needle was removed.    A total of 40 mg of Kenalog was utilized for the procedure.   The patient tolerated the injection well.    PLAN:   1. Medications: We will continue presently prescribed medications Effexor Neurontin and oxycodone 2. Will consider modification of treatment regimen pending response to treatment  rendered on today's visit and follow-up evaluation. 3. The patient is to follow-up with primary care physician Dr.N Humphrey Rolls  regarding blood pressure and general medical condition status post lumbar epidural steroid injection performed on today's visit. 4. Surgical evaluation.. We will consider neurosurgical reevaluation and patient will undergo orthopedic follow-up evaluation with Dr.Hooten  5. Neurological reevaluation to be considered. 6. The patient may be a candidate for radiofrequency procedures, implantation device, and other treatment pending response to treatment and follow-up evaluation. 7. The patient has been advised to adhere to proper body mechanics and avoid activities which appear to aggravate condition. 8. The patient has been advised to call the Pain Management Center prior to scheduled return appointment should there be significant change in condition or should there be sign  The patient is understanding and agrees with the suggested  treatment plan     Review of Systems     Objective:   Physical Exam        Assessment & Plan:

## 2015-05-28 NOTE — Patient Instructions (Addendum)
Continue present medications Effexor Neurontin and oxycodone and obtain antibiotic. Please obtain your antibiotic Ceftin today and begin taking antibiotic today  F/U PCP  Wolfgang Phoenix for evaliation of  BP and general medical  condition.  F/U surgical evaluation as discussed Dr. Marry Guan  Neurosurgical evaluation to be considered as discussed  F/U neurological evaluation.  May consider radiofrequency rhizolysis or intraspinal procedures pending response to present treatment and F/U evaluation.  Patient to call Pain Management Center should patient have concerns prior to scheduled return appointment.  Pain Management Discharge Instructions  General Discharge Instructions :  If you need to reach your doctor call: Monday-Friday 8:00 am - 4:00 pm at [phone] or toll free [phone].  After clinic hours [phone] to have operator reach doctor.  Bring all of your medication bottles to all your appointments in the pain clinic.  To cancel or reschedule your appointment with Pain Management please remember to call 24 hours in advance to avoid a fee.  Refer to the educational materials which you have been given on: General Risks, I had my Procedure. Discharge Instructions, Post Sedation.  Post Procedure Instructions:  The drugs you were given will stay in your system until tomorrow, so for the next 24 hours you should not drive, make any legal decisions or drink any alcoholic beverages.  You may eat anything you prefer, but it is better to start with liquids then soups and crackers, and gradually work up to solid foods.  Please notify your doctor immediately if you have any unusual bleeding, trouble breathing or pain that is not related to your normal pain.  Depending on the type of procedure that was done, some parts of your body may feel week and/or numb.  This usually clears up by tonight or the next day.  Walk with the use of an assistive device or accompanied by an adult for the 24  hours.  You may use ice on the affected area for the first 24 hours.  Put ice in a Ziploc bag and cover with a towel and place against area 15 minutes on 15 minutes off.  You may switch to heat after 24 hours.GENERAL RISKS AND COMPLICATIONS  What are the risk, side effects and possible complications? Generally speaking, most procedures are safe.  However, with any procedure there are risks, side effects, and the possibility of complications.  The risks and complications are dependent upon the sites that are lesioned, or the type of nerve block to be performed.  The closer the procedure is to the spine, the more serious the risks are.  Great care is taken when placing the radio frequency needles, block needles or lesioning probes, but sometimes complications can occur. 1. Infection: Any time there is an injection through the skin, there is a risk of infection.  This is why sterile conditions are used for these blocks.  There are four possible types of infection. 1. Localized skin infection. 2. Central Nervous System Infection-This can be in the form of Meningitis, which can be deadly. 3. Epidural Infections-This can be in the form of an epidural abscess, which can cause pressure inside of the spine, causing compression of the spinal cord with subsequent paralysis. This would require an emergency surgery to decompress, and there are no guarantees that the patient would recover from the paralysis. 4. Discitis-This is an infection of the intervertebral discs.  It occurs in about 1% of discography procedures.  It is difficult to treat and it may lead to surgery.  2. Pain: the needles have to go through skin and soft tissues, will cause soreness.       3. Damage to internal structures:  The nerves to be lesioned may be near blood vessels or    other nerves which can be potentially damaged.       4. Bleeding: Bleeding is more common if the patient is taking blood thinners such as  aspirin, Coumadin,  Ticiid, Plavix, etc., or if he/she have some genetic predisposition  such as hemophilia. Bleeding into the spinal canal can cause compression of the spinal  cord with subsequent paralysis.  This would require an emergency surgery to  decompress and there are no guarantees that the patient would recover from the  paralysis.       5. Pneumothorax:  Puncturing of a lung is a possibility, every time a needle is introduced in  the area of the chest or upper back.  Pneumothorax refers to free air around the  collapsed lung(s), inside of the thoracic cavity (chest cavity).  Another two possible  complications related to a similar event would include: Hemothorax and Chylothorax.   These are variations of the Pneumothorax, where instead of air around the collapsed  lung(s), you may have blood or chyle, respectively.       6. Spinal headaches: They may occur with any procedures in the area of the spine.       7. Persistent CSF (Cerebro-Spinal Fluid) leakage: This is a rare problem, but may occur  with prolonged intrathecal or epidural catheters either due to the formation of a fistulous  track or a dural tear.       8. Nerve damage: By working so close to the spinal cord, there is always a possibility of  nerve damage, which could be as serious as a permanent spinal cord injury with  paralysis.       9. Death:  Although rare, severe deadly allergic reactions known as "Anaphylactic  reaction" can occur to any of the medications used.      10. Worsening of the symptoms:  We can always make thing worse.  What are the chances of something like this happening? Chances of any of this occuring are extremely low.  By statistics, you have more of a chance of getting killed in a motor vehicle accident: while driving to the hospital than any of the above occurring .  Nevertheless, you should be aware that they are possibilities.  In general, it is similar to taking a shower.  Everybody knows that you can slip, hit your head and  get killed.  Does that mean that you should not shower again?  Nevertheless always keep in mind that statistics do not mean anything if you happen to be on the wrong side of them.  Even if a procedure has a 1 (one) in a 1,000,000 (million) chance of going wrong, it you happen to be that one..Also, keep in mind that by statistics, you have more of a chance of having something go wrong when taking medications.  Who should not have this procedure? If you are on a blood thinning medication (e.g. Coumadin, Plavix, see list of "Blood Thinners"), or if you have an active infection going on, you should not have the procedure.  If you are taking any blood thinners, please inform your physician.  How should I prepare for this procedure?  Do not eat or drink anything at least six hours prior to the procedure.  Bring a driver  with you .  It cannot be a taxi.  Come accompanied by an adult that can drive you back, and that is strong enough to help you if your legs get weak or numb from the local anesthetic.  Take all of your medicines the morning of the procedure with just enough water to swallow them.  If you have diabetes, make sure that you are scheduled to have your procedure done first thing in the morning, whenever possible.  If you have diabetes, take only half of your insulin dose and notify our nurse that you have done so as soon as you arrive at the clinic.  If you are diabetic, but only take blood sugar pills (oral hypoglycemic), then do not take them on the morning of your procedure.  You may take them after you have had the procedure.  Do not take aspirin or any aspirin-containing medications, at least eleven (11) days prior to the procedure.  They may prolong bleeding.  Wear loose fitting clothing that may be easy to take off and that you would not mind if it got stained with Betadine or blood.  Do not wear any jewelry or perfume  Remove any nail coloring.  It will interfere with some of  our monitoring equipment.  NOTE: Remember that this is not meant to be interpreted as a complete list of all possible complications.  Unforeseen problems may occur.  BLOOD THINNERS The following drugs contain aspirin or other products, which can cause increased bleeding during surgery and should not be taken for 2 weeks prior to and 1 week after surgery.  If you should need take something for relief of minor pain, you may take acetaminophen which is found in Tylenol,m Datril, Anacin-3 and Panadol. It is not blood thinner. The products listed below are.  Do not take any of the products listed below in addition to any listed on your instruction sheet.  A.P.C or A.P.C with Codeine Codeine Phosphate Capsules #3 Ibuprofen Ridaura  ABC compound Congesprin Imuran rimadil  Advil Cope Indocin Robaxisal  Alka-Seltzer Effervescent Pain Reliever and Antacid Coricidin or Coricidin-D  Indomethacin Rufen  Alka-Seltzer plus Cold Medicine Cosprin Ketoprofen S-A-C Tablets  Anacin Analgesic Tablets or Capsules Coumadin Korlgesic Salflex  Anacin Extra Strength Analgesic tablets or capsules CP-2 Tablets Lanoril Salicylate  Anaprox Cuprimine Capsules Levenox Salocol  Anexsia-D Dalteparin Magan Salsalate  Anodynos Darvon compound Magnesium Salicylate Sine-off  Ansaid Dasin Capsules Magsal Sodium Salicylate  Anturane Depen Capsules Marnal Soma  APF Arthritis pain formula Dewitt's Pills Measurin Stanback  Argesic Dia-Gesic Meclofenamic Sulfinpyrazone  Arthritis Bayer Timed Release Aspirin Diclofenac Meclomen Sulindac  Arthritis pain formula Anacin Dicumarol Medipren Supac  Analgesic (Safety coated) Arthralgen Diffunasal Mefanamic Suprofen  Arthritis Strength Bufferin Dihydrocodeine Mepro Compound Suprol  Arthropan liquid Dopirydamole Methcarbomol with Aspirin Synalgos  ASA tablets/Enseals Disalcid Micrainin Tagament  Ascriptin Doan's Midol Talwin  Ascriptin A/D Dolene Mobidin Tanderil  Ascriptin Extra Strength  Dolobid Moblgesic Ticlid  Ascriptin with Codeine Doloprin or Doloprin with Codeine Momentum Tolectin  Asperbuf Duoprin Mono-gesic Trendar  Aspergum Duradyne Motrin or Motrin IB Triminicin  Aspirin plain, buffered or enteric coated Durasal Myochrisine Trigesic  Aspirin Suppositories Easprin Nalfon Trillsate  Aspirin with Codeine Ecotrin Regular or Extra Strength Naprosyn Uracel  Atromid-S Efficin Naproxen Ursinus  Auranofin Capsules Elmiron Neocylate Vanquish  Axotal Emagrin Norgesic Verin  Azathioprine Empirin or Empirin with Codeine Normiflo Vitamin E  Azolid Emprazil Nuprin Voltaren  Bayer Aspirin plain, buffered or children's or timed BC Tablets or powders  Encaprin Orgaran Warfarin Sodium  Buff-a-Comp Enoxaparin Orudis Zorpin  Buff-a-Comp with Codeine Equegesic Os-Cal-Gesic   Buffaprin Excedrin plain, buffered or Extra Strength Oxalid   Bufferin Arthritis Strength Feldene Oxphenbutazone   Bufferin plain or Extra Strength Feldene Capsules Oxycodone with Aspirin   Bufferin with Codeine Fenoprofen Fenoprofen Pabalate or Pabalate-SF   Buffets II Flogesic Panagesic   Buffinol plain or Extra Strength Florinal or Florinal with Codeine Panwarfarin   Buf-Tabs Flurbiprofen Penicillamine   Butalbital Compound Four-way cold tablets Penicillin   Butazolidin Fragmin Pepto-Bismol   Carbenicillin Geminisyn Percodan   Carna Arthritis Reliever Geopen Persantine   Carprofen Gold's salt Persistin   Chloramphenicol Goody's Phenylbutazone   Chloromycetin Haltrain Piroxlcam   Clmetidine heparin Plaquenil   Cllnoril Hyco-pap Ponstel   Clofibrate Hydroxy chloroquine Propoxyphen         Before stopping any of these medications, be sure to consult the physician who ordered them.  Some, such as Coumadin (Warfarin) are ordered to prevent or treat serious conditions such as "deep thrombosis", "pumonary embolisms", and other heart problems.  The amount of time that you may need off of the medication may also  vary with the medication and the reason for which you were taking it.  If you are taking any of these medications, please make sure you notify your pain physician before you undergo any procedures.  Antibiotic to be picked up at pharmacy.

## 2015-05-28 NOTE — Progress Notes (Signed)
Safety precautions to be maintained throughout the outpatient stay will include: orient to surroundings, keep bed in low position, maintain call bell within reach at all times, provide assistance with transfer out of bed and ambulation.  

## 2015-05-29 ENCOUNTER — Telehealth: Payer: Self-pay

## 2015-05-29 NOTE — Telephone Encounter (Signed)
States she is doing OK.

## 2015-06-16 ENCOUNTER — Encounter: Payer: Self-pay | Admitting: Pain Medicine

## 2015-06-16 ENCOUNTER — Ambulatory Visit: Payer: Medicare Other | Attending: Pain Medicine | Admitting: Pain Medicine

## 2015-06-16 VITALS — BP 124/55 | HR 55 | Temp 98.0°F | Resp 16 | Ht 63.0 in | Wt 179.0 lb

## 2015-06-16 DIAGNOSIS — M19012 Primary osteoarthritis, left shoulder: Secondary | ICD-10-CM

## 2015-06-16 DIAGNOSIS — M5126 Other intervertebral disc displacement, lumbar region: Secondary | ICD-10-CM | POA: Insufficient documentation

## 2015-06-16 DIAGNOSIS — M5136 Other intervertebral disc degeneration, lumbar region: Secondary | ICD-10-CM

## 2015-06-16 DIAGNOSIS — Z96643 Presence of artificial hip joint, bilateral: Secondary | ICD-10-CM

## 2015-06-16 DIAGNOSIS — Z9889 Other specified postprocedural states: Secondary | ICD-10-CM

## 2015-06-16 DIAGNOSIS — M48062 Spinal stenosis, lumbar region with neurogenic claudication: Secondary | ICD-10-CM

## 2015-06-16 DIAGNOSIS — M961 Postlaminectomy syndrome, not elsewhere classified: Secondary | ICD-10-CM

## 2015-06-16 DIAGNOSIS — M533 Sacrococcygeal disorders, not elsewhere classified: Secondary | ICD-10-CM

## 2015-06-16 DIAGNOSIS — Z96649 Presence of unspecified artificial hip joint: Secondary | ICD-10-CM | POA: Insufficient documentation

## 2015-06-16 DIAGNOSIS — M19011 Primary osteoarthritis, right shoulder: Secondary | ICD-10-CM

## 2015-06-16 DIAGNOSIS — M5030 Other cervical disc degeneration, unspecified cervical region: Secondary | ICD-10-CM

## 2015-06-16 DIAGNOSIS — M79605 Pain in left leg: Secondary | ICD-10-CM | POA: Diagnosis present

## 2015-06-16 DIAGNOSIS — M47816 Spondylosis without myelopathy or radiculopathy, lumbar region: Secondary | ICD-10-CM | POA: Diagnosis not present

## 2015-06-16 DIAGNOSIS — M545 Low back pain: Secondary | ICD-10-CM | POA: Diagnosis present

## 2015-06-16 DIAGNOSIS — M19019 Primary osteoarthritis, unspecified shoulder: Secondary | ICD-10-CM | POA: Diagnosis not present

## 2015-06-16 DIAGNOSIS — M79604 Pain in right leg: Secondary | ICD-10-CM | POA: Diagnosis present

## 2015-06-16 MED ORDER — OXYCODONE HCL 5 MG PO CAPS: ORAL_CAPSULE | ORAL | Status: DC

## 2015-06-16 NOTE — Progress Notes (Signed)
Safety precautions to be maintained throughout the outpatient stay will include: orient to surroundings, keep bed in low position, maintain call bell within reach at all times, provide assistance with transfer out of bed and ambulation.  

## 2015-06-16 NOTE — Progress Notes (Signed)
   Subjective:    Patient ID: Yvonne Singleton, female    DOB: 31-Dec-1934, 80 y.o.   MRN: [id number]  HPI  Patient is 80 year old female returns to [place] for further evaluation and treatment of pain involving the lumbar lower extremity region. Patient is with prior surgical intervention of the lumbar region as well as prior total hip replacement. At the present time there is concern regarding intraspinal abnormalities continue patient's symptomatology. We will proceed with updating patient's MRI of the lumbar spine and we will schedule patient for lumbar epidural steroid injection to be performed at time return appointment as discussed and as explained to patient on today's visit. Understanding and in agreement suggested treatment plan. We will proceed with lumbar epidural steroid injection in attempt to decrease severity of patient's symptoms, minimize progression of patient's symptoms, and avoid the need for more involved treatment.. The patient was understanding and in agreement status treatment plan.    Review of Systems     Objective:   Physical Exam  There was tenderness of the splenius capitate and occipitalis musculature region of mild degree. There was mild tenderness of the region of the cervical facet cervical paraspinal musculature region. There was mild to moderate tenderness over the acromioclavicular glenohumeral joint region. Tinel and Phalen's maneuver associated with increased pain of moderate degree. Patient was with evidence of decreased grip strength. There appeared to be unremarkable Spurling's maneuver. Palpation over the thoracic facet thoracic paraspinal muscles region was with moderate tends to palpation without crepitus of the thoracic region haven't been noted. Palpation over the lumbar paraspinal muscle lumbar facet region was a tends to palpation of moderate moderately severe degree. There was moderate to moderately severe tenderness to palpation  over the lumbar paraspinal musculature region. Palpation over the PSIS and PII S region was with moderate discomfort. Straight leg raising limited to approximately 20 without a definite increased pain with dorsiflexion noted EHL strength was decreased. No definite sensory deficit of dermatomal distribution was detected.. There appeared to be negative clonus negative Homans. Moderate tenderness along the greater trochanteric iliotibial band region. Abdomen nontender with no costovertebral tenderness noted.    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 Wolfgang Phoenix for evaliation of  BP and general medical  condition  F/U surgical evaluation as discussed  F/U neurological evaluation  Lumbar MRI. We will obtain updated lumbar MRI to evaluate for disc protrusion, stenosis, and other abnormalities which may be contributing to patient's severe lower back lower extremity pain paresthesias and weakness  May consider radiofrequency rhizolysis or intraspinal procedures pending response to present treatment and F/U evaluation   Patient to call Pain Management Center should patient have concerns prior to scheduled return appointment.

## 2015-06-16 NOTE — Patient Instructions (Addendum)
Continue present  medications Neurontin Effexor XR and oxycodone  Lumbar epidural steroid injection to be performed at time of return appointment as discussed  F/U PCP Dr. Wolfgang Phoenix  for evaliation of  BP and general medical  condition  F/U surgical evaluation as discussed  F/U neurological evaluation  Ask Caryl Pina date of your lumbar MRI  May consider radiofrequency rhizolysis or intraspinal procedures pending response to present treatment and F/U evaluation   Patient to call [facility] should patient have concerns prior to scheduled return appointment. GENERAL RISKS AND COMPLICATIONS  What are the risk, side effects and possible complications? Generally speaking, most procedures are safe.  However, with any procedure there are risks, side effects, and the possibility of complications.  The risks and complications are dependent upon the sites that are lesioned, or the type of nerve block to be performed.  The closer the procedure is to the spine, the more serious the risks are.  Great care is taken when placing the radio frequency needles, block needles or lesioning probes, but sometimes complications can occur. 1. Infection: Any time there is an injection through the skin, there is a risk of infection.  This is why sterile conditions are used for these blocks.  There are four possible types of infection. 1. Localized skin infection. 2. Central Nervous System Infection-This can be in the form of Meningitis, which can be deadly. 3. Epidural Infections-This can be in the form of an epidural abscess, which can cause pressure inside of the spine, causing compression of the spinal cord with subsequent paralysis. This would require an emergency surgery to decompress, and there are no guarantees that the patient would recover from the paralysis. 4. Discitis-This is an infection of the intervertebral discs.  It occurs in about 1% of discography procedures.  It is difficult to treat and it may  lead to surgery.        2. Pain: the needles have to go through skin and soft tissues, will cause soreness.       3. Damage to internal structures:  The nerves to be lesioned may be near blood vessels or    other nerves which can be potentially damaged.       4. Bleeding: Bleeding is more common if the patient is taking blood thinners such as  aspirin, Coumadin, Ticiid, Plavix, etc., or if he/she have some genetic predisposition  such as hemophilia. Bleeding into the spinal canal can cause compression of the spinal  cord with subsequent paralysis.  This would require an emergency surgery to  decompress and there are no guarantees that the patient would recover from the  paralysis.       5. Pneumothorax:  Puncturing of a lung is a possibility, every time a needle is introduced in  the area of the chest or upper back.  Pneumothorax refers to free air around the  collapsed lung(s), inside of the thoracic cavity (chest cavity).  Another two possible  complications related to a similar event would include: Hemothorax and Chylothorax.   These are variations of the Pneumothorax, where instead of air around the collapsed  lung(s), you may have blood or chyle, respectively.       6. Spinal headaches: They may occur with any procedures in the area of the spine.       7. Persistent CSF (Cerebro-Spinal Fluid) leakage: This is a rare problem, but may occur  with prolonged intrathecal or epidural catheters either due to the formation of a  fistulous  track or a dural tear.       8. Nerve damage: By working so close to the spinal cord, there is always a possibility of  nerve damage, which could be as serious as a permanent spinal cord injury with  paralysis.       9. Death:  Although rare, severe deadly allergic reactions known as "Anaphylactic  reaction" can occur to any of the medications used.      10. Worsening of the symptoms:  We can always make thing worse.  What are the chances of something like this  happening? Chances of any of this occuring are extremely low.  By statistics, you have more of a chance of getting killed in a motor vehicle accident: while driving to the hospital than any of the above occurring .  Nevertheless, you should be aware that they are possibilities.  In general, it is similar to taking a shower.  Everybody knows that you can slip, hit your head and get killed.  Does that mean that you should not shower again?  Nevertheless always keep in mind that statistics do not mean anything if you happen to be on the wrong side of them.  Even if a procedure has a 1 (one) in a 1,000,000 (million) chance of going wrong, it you happen to be that one..Also, keep in mind that by statistics, you have more of a chance of having something go wrong when taking medications.  Who should not have this procedure? If you are on a blood thinning medication (e.g. Coumadin, Plavix, see list of "Blood Thinners"), or if you have an active infection going on, you should not have the procedure.  If you are taking any blood thinners, please inform your physician.  How should I prepare for this procedure?  Do not eat or drink anything at least six hours prior to the procedure.  Bring a driver with you .  It cannot be a taxi.  Come accompanied by an adult that can drive you back, and that is strong enough to help you if your legs get weak or numb from the local anesthetic.  Take all of your medicines the morning of the procedure with just enough water to swallow them.  If you have diabetes, make sure that you are scheduled to have your procedure done first thing in the morning, whenever possible.  If you have diabetes, take only half of your insulin dose and notify our nurse that you have done so as soon as you arrive at the clinic.  If you are diabetic, but only take blood sugar pills (oral hypoglycemic), then do not take them on the morning of your procedure.  You may take them after you have had the  procedure.  Do not take aspirin or any aspirin-containing medications, at least eleven (11) days prior to the procedure.  They may prolong bleeding.  Wear loose fitting clothing that may be easy to take off and that you would not mind if it got stained with Betadine or blood.  Do not wear any jewelry or perfume  Remove any nail coloring.  It will interfere with some of our monitoring equipment.  NOTE: Remember that this is not meant to be interpreted as a complete list of all possible complications.  Unforeseen problems may occur.  BLOOD THINNERS The following drugs contain aspirin or other products, which can cause increased bleeding during surgery and should not be taken for 2 weeks prior to and 1 week  after surgery.  If you should need take something for relief of minor pain, you may take acetaminophen which is found in Tylenol,m Datril, Anacin-3 and Panadol. It is not blood thinner. The products listed below are.  Do not take any of the products listed below in addition to any listed on your instruction sheet.  A.P.C or A.P.C with Codeine Codeine Phosphate Capsules #3 Ibuprofen Ridaura  ABC compound Congesprin Imuran rimadil  Advil Cope Indocin Robaxisal  Alka-Seltzer Effervescent Pain Reliever and Antacid Coricidin or Coricidin-D  Indomethacin Rufen  Alka-Seltzer plus Cold Medicine Cosprin Ketoprofen S-A-C Tablets  Anacin Analgesic Tablets or Capsules Coumadin Korlgesic Salflex  Anacin Extra Strength Analgesic tablets or capsules CP-2 Tablets Lanoril Salicylate  Anaprox Cuprimine Capsules Levenox Salocol  Anexsia-D Dalteparin Magan Salsalate  Anodynos Darvon compound Magnesium Salicylate Sine-off  Ansaid Dasin Capsules Magsal Sodium Salicylate  Anturane Depen Capsules Marnal Soma  APF Arthritis pain formula Dewitt's Pills Measurin Stanback  Argesic Dia-Gesic Meclofenamic Sulfinpyrazone  Arthritis Bayer Timed Release Aspirin Diclofenac Meclomen Sulindac  Arthritis pain formula  Anacin Dicumarol Medipren Supac  Analgesic (Safety coated) Arthralgen Diffunasal Mefanamic Suprofen  Arthritis Strength Bufferin Dihydrocodeine Mepro Compound Suprol  Arthropan liquid Dopirydamole Methcarbomol with Aspirin Synalgos  ASA tablets/Enseals Disalcid Micrainin Tagament  Ascriptin Doan's Midol Talwin  Ascriptin A/D Dolene Mobidin Tanderil  Ascriptin Extra Strength Dolobid Moblgesic Ticlid  Ascriptin with Codeine Doloprin or Doloprin with Codeine Momentum Tolectin  Asperbuf Duoprin Mono-gesic Trendar  Aspergum Duradyne Motrin or Motrin IB Triminicin  Aspirin plain, buffered or enteric coated Durasal Myochrisine Trigesic  Aspirin Suppositories Easprin Nalfon Trillsate  Aspirin with Codeine Ecotrin Regular or Extra Strength Naprosyn Uracel  Atromid-S Efficin Naproxen Ursinus  Auranofin Capsules Elmiron Neocylate Vanquish  Axotal Emagrin Norgesic Verin  Azathioprine Empirin or Empirin with Codeine Normiflo Vitamin E  Azolid Emprazil Nuprin Voltaren  Bayer Aspirin plain, buffered or children's or timed BC Tablets or powders Encaprin Orgaran Warfarin Sodium  Buff-a-Comp Enoxaparin Orudis Zorpin  Buff-a-Comp with Codeine Equegesic Os-Cal-Gesic   Buffaprin Excedrin plain, buffered or Extra Strength Oxalid   Bufferin Arthritis Strength Feldene Oxphenbutazone   Bufferin plain or Extra Strength Feldene Capsules Oxycodone with Aspirin   Bufferin with Codeine Fenoprofen Fenoprofen Pabalate or Pabalate-SF   Buffets II Flogesic Panagesic   Buffinol plain or Extra Strength Florinal or Florinal with Codeine Panwarfarin   Buf-Tabs Flurbiprofen Penicillamine   Butalbital Compound Four-way cold tablets Penicillin   Butazolidin Fragmin Pepto-Bismol   Carbenicillin Geminisyn Percodan   Carna Arthritis Reliever Geopen Persantine   Carprofen Gold's salt Persistin   Chloramphenicol Goody's Phenylbutazone   Chloromycetin Haltrain Piroxlcam   Clmetidine heparin Plaquenil   Cllnoril Hyco-pap  Ponstel   Clofibrate Hydroxy chloroquine Propoxyphen         Before stopping any of these medications, be sure to consult the physician who ordered them.  Some, such as Coumadin (Warfarin) are ordered to prevent or treat serious conditions such as "deep thrombosis", "pumonary embolisms", and other heart problems.  The amount of time that you may need off of the medication may also vary with the medication and the reason for which you were taking it.  If you are taking any of these medications, please make sure you notify your pain physician before you undergo any procedures.         Epidural Steroid Injection Patient Information  Description: The epidural space surrounds the nerves as they exit the spinal cord.  In some patients, the nerves can be compressed and inflamed by  a bulging disc or a tight spinal canal (spinal stenosis).  By injecting steroids into the epidural space, we can bring irritated nerves into direct contact with a potentially helpful medication.  These steroids act directly on the irritated nerves and can reduce swelling and inflammation which often leads to decreased pain.  Epidural steroids may be injected anywhere along the spine and from the neck to the low back depending upon the location of your pain.   After numbing the skin with local anesthetic (like Novocaine), a small needle is passed into the epidural space slowly.  You may experience a sensation of pressure while this is being done.  The entire block usually last less than 10 minutes.  Conditions which may be treated by epidural steroids:   Low back and leg pain  Neck and arm pain  Spinal stenosis  Post-laminectomy syndrome  Herpes zoster (shingles) pain  Pain from compression fractures  Preparation for the injection:  1. Do not eat any solid food or dairy products within 6 hours of your appointment.  2. You may drink clear liquids up to 2 hours before appointment.  Clear liquids include water,  black coffee, juice or soda.  No milk or cream please. 3. You may take your regular medication, including pain medications, with a sip of water before your appointment  Diabetics should hold regular insulin (if taken separately) and take 1/2 normal NPH dos the morning of the procedure.  Carry some sugar containing items with you to your appointment. 4. A driver must accompany you and be prepared to drive you home after your procedure.  5. Bring all your current medications with your. 6. An IV may be inserted and sedation may be given at the discretion of the physician.   7. A blood pressure cuff, EKG and other monitors will often be applied during the procedure.  Some patients may need to have extra oxygen administered for a short period. 8. You will be asked to provide medical information, including your allergies, prior to the procedure.  We must know immediately if you are taking blood thinners (like Coumadin/Warfarin)  Or if you are allergic to IV iodine contrast (dye). We must know if you could possible be pregnant.  Possible side-effects:  Bleeding from needle site  Infection (rare, may require surgery)  Nerve injury (rare)  Numbness & tingling (temporary)  Difficulty urinating (rare, temporary)  Spinal headache ( a headache worse with upright posture)  Light -headedness (temporary)  Pain at injection site (several days)  Decreased blood pressure (temporary)  Weakness in arm/leg (temporary)  Pressure sensation in back/neck (temporary)  Call if you experience:  Fever/chills associated with headache or increased back/neck pain.  Headache worsened by an upright position.  New onset weakness or numbness of an extremity below the injection site  Hives or difficulty breathing (go to the emergency room)  Inflammation or drainage at the infection site  Severe back/neck pain  Any new symptoms which are concerning to you  Please note:  Although the local anesthetic  injected can often make your back or neck feel good for several hours after the injection, the pain will likely return.  It takes 3-7 days for steroids to work in the epidural space.  You may not notice any pain relief for at least that one week.  If effective, we will often do a series of three injections spaced 3-6 weeks apart to maximally decrease your pain.  After the initial series, we generally will wait several  months before considering a repeat injection of the same type.  If you have any questions, please call [phone] [facility] Clinic[facility]xychloroquine tablets What is this medicine? HYDROXYCHLOROQUINE (hye drox ee KLOR oh kwin) is used to treat rheumatoid arthritis and systemic lupus erythematosus. It is also used to treat malaria. This medicine may be used for other purposes; ask your health care provider or pharmacist if you have questions. COMMON BRAND NAME(S): Plaquenil, Quineprox What should I tell my health care provider before I take this medicine? They need to know if you have any of these conditions: -alcoholism -anemia or other blood disorder -eye disease -glucose 6-phosphate dehydrogenase (G6PD) deficiency -liver disease -porphyria -psoriasis -an unusual or allergic reaction to chloroquine, hydroxychloroquine, other medicines, foods, dyes, or preservatives -pregnant or trying to get pregnant -breast-feeding How should I use this medicine? Take this medicine by mouth with a glass of water. Follow the directions on the prescription label. If this medicine upsets your stomach take it with food or milk. Take your doses at regular intervals. Do not take your medicine more often than directed. Talk to your pediatrician regarding the use of this medicine in children. Special care may be needed. Overdosage: If you think you have taken too much of this medicine contact a poison control center or emergency room at once. NOTE: This medicine is  only for you. Do not share this medicine with others. What if I miss a dose? If you miss a dose, take it as soon as you can. If it is almost time for your next dose, take only that dose. Do not take double or extra doses. What may interact with this medicine? -antacids -botulinum toxins -digoxin -kaolin -penicillamine This list may not describe all possible interactions. Give your health care provider a list of all the medicines, herbs, non-prescription drugs, or dietary supplements you use. Also tell them if you smoke, drink alcohol, or use illegal drugs. Some items may interact with your medicine. What should I watch for while using this medicine? Visit your doctor or health care professional for regular check ups. Tell your doctor if your symptoms do not improve. Arthritis symptoms may take several weeks to improve. If you are taking this medicine for a long time, you will need important blood work done. You will also need to have your eyes checked as directed. This medicine can make you more sensitive to the sun. Keep out of the sun. If you cannot avoid being in the sun, wear protective clothing and use sunscreen. Do not use sun lamps or tanning beds/booths. Avoid antacids and kaolin containing products for 2 hours before and after taking a dose of this medicine. What side effects may I notice from receiving this medicine? Side effects that you should report to your doctor or health care professional as soon as possible: -allergic reactions like skin rash, itching or hives, swelling of the face, lips, or tongue -change in vision -fever, infection -hearing loss or ringing -muscle weakness, tremor, or numbness -redness, blistering, peeling or loosening of the skin, including inside the mouth -seizures -unusual bleeding or bruising -unusually weak or tired Side effects that usually do not require medical attention (report to your doctor or health care professional if they continue or are  bothersome): -change in coloration of the mouth or skin -dizziness -hair loss, lightening -headache -irritability, nervousness, nightmares -loss of appetite -stomach upset, diarrhea This list may not describe all possible side effects. Call your doctor for medical advice about  side effects. You may report side effects to FDA at 1-800-FDA-1088. Where should I keep my medicine? Keep out of the reach of children. In children, this medicine can cause overdose with small doses. Store at room temperature between 15 and 30 degrees C (59 and 86 degrees F). Protect from moisture and light. Throw away any unused medicine after the expiration date. NOTE: This sheet is a summary. It may not cover all possible information. If you have questions about this medicine, talk to your doctor, pharmacist, or health care provider.  2015, [employer]. (2008-02-16 15:01:50)

## 2015-06-16 NOTE — Progress Notes (Signed)
Discharged to home.  Pre procedure instructrions given with teach back 3 done.  Stop Plaquinil for 5 days prior to procedure per Dr Primus Bravo

## 2015-06-24 ENCOUNTER — Emergency Department
Admission: EM | Admit: 2015-06-24 | Discharge: 2015-06-24 | Disposition: A | Discharge status: Home / Self Care | Payer: Medicare Other | Attending: Emergency Medicine | Admitting: Emergency Medicine

## 2015-06-24 ENCOUNTER — Emergency Department: Payer: Medicare Other

## 2015-06-24 ENCOUNTER — Emergency Department: Payer: Medicare Other | Admitting: Anesthesiology

## 2015-06-24 ENCOUNTER — Encounter: Payer: Self-pay | Admitting: Emergency Medicine

## 2015-06-24 ENCOUNTER — Encounter
Admission: EM | Disposition: A | Discharge status: Home / Self Care | Payer: Self-pay | Source: Home / Self Care | Attending: Emergency Medicine

## 2015-06-24 DIAGNOSIS — G4730 Sleep apnea, unspecified: Secondary | ICD-10-CM | POA: Diagnosis not present

## 2015-06-24 DIAGNOSIS — G20 Parkinson's disease: Secondary | ICD-10-CM | POA: Insufficient documentation

## 2015-06-24 DIAGNOSIS — I10 Essential (primary) hypertension: Secondary | ICD-10-CM | POA: Diagnosis not present

## 2015-06-24 DIAGNOSIS — Z79890 Hormone replacement therapy (postmenopausal): Secondary | ICD-10-CM | POA: Insufficient documentation

## 2015-06-24 DIAGNOSIS — T84021A Dislocation of internal left hip prosthesis, initial encounter: Secondary | ICD-10-CM | POA: Diagnosis not present

## 2015-06-24 DIAGNOSIS — Z419 Encounter for procedure for purposes other than remedying health state, unspecified: Secondary | ICD-10-CM

## 2015-06-24 DIAGNOSIS — S73005A Unspecified dislocation of left hip, initial encounter: Secondary | ICD-10-CM | POA: Diagnosis present

## 2015-06-24 DIAGNOSIS — Z79899 Other long term (current) drug therapy: Secondary | ICD-10-CM | POA: Insufficient documentation

## 2015-06-24 DIAGNOSIS — Y792 Prosthetic and other implants, materials and accessory orthopedic devices associated with adverse incidents: Secondary | ICD-10-CM | POA: Diagnosis not present

## 2015-06-24 DIAGNOSIS — Z7982 Long term (current) use of aspirin: Secondary | ICD-10-CM | POA: Insufficient documentation

## 2015-06-24 DIAGNOSIS — M109 Gout, unspecified: Secondary | ICD-10-CM | POA: Diagnosis not present

## 2015-06-24 DIAGNOSIS — Z96643 Presence of artificial hip joint, bilateral: Secondary | ICD-10-CM | POA: Diagnosis not present

## 2015-06-24 HISTORY — PX: HIP CLOSED REDUCTION: SHX983

## 2015-06-24 LAB — CBC WITH DIFFERENTIAL/PLATELET
Basophils Absolute: 0 10*3/uL (ref 0–0.1)
Basophils Relative: 0 %
EOS ABS: 0.1 10*3/uL (ref 0–0.7)
Eosinophils Relative: 1 %
HEMATOCRIT: 38.2 % (ref 35.0–47.0)
HEMOGLOBIN: 12.8 g/dL (ref 12.0–16.0)
Lymphocytes Relative: 21 %
Lymphs Abs: 1.7 10*3/uL (ref 1.0–3.6)
MCH: 32.5 pg (ref 26.0–34.0)
MCHC: 33.6 g/dL (ref 32.0–36.0)
MCV: 96.5 fL (ref 80.0–100.0)
MONOS PCT: 5 %
Monocytes Absolute: 0.4 10*3/uL (ref 0.2–0.9)
NEUTROS ABS: 5.9 10*3/uL (ref 1.4–6.5)
Neutrophils Relative %: 73 %
Platelets: 221 10*3/uL (ref 150–440)
RBC: 3.96 MIL/uL (ref 3.80–5.20)
RDW: 14.4 % (ref 11.5–14.5)
WBC: 8.1 10*3/uL (ref 3.6–11.0)

## 2015-06-24 LAB — URINALYSIS COMPLETE WITH MICROSCOPIC (ARMC ONLY)
BILIRUBIN URINE: NEGATIVE
Bacteria, UA: NONE SEEN
Glucose, UA: NEGATIVE mg/dL
KETONES UR: NEGATIVE mg/dL
Leukocytes, UA: NEGATIVE
NITRITE: NEGATIVE
Protein, ur: NEGATIVE mg/dL
Specific Gravity, Urine: 1.008 (ref 1.005–1.030)
WBC, UA: NONE SEEN WBC/hpf (ref 0–5)
pH: 6 (ref 5.0–8.0)

## 2015-06-24 LAB — BASIC METABOLIC PANEL
Anion gap: 10 (ref 5–15)
BUN: 28 mg/dL — ABNORMAL HIGH (ref 6–20)
CHLORIDE: 95 mmol/L — AB (ref 101–111)
CO2: 25 mmol/L (ref 22–32)
CREATININE: 0.87 mg/dL (ref 0.44–1.00)
Calcium: 9.2 mg/dL (ref 8.9–10.3)
GFR calc Af Amer: >60 mL/min (ref >60)
GFR calc non Af Amer: >60 mL/min (ref >60)
Glucose, Bld: 92 mg/dL (ref 65–99)
Potassium: 4.1 mmol/L (ref 3.5–5.1)
Sodium: 130 mmol/L — ABNORMAL LOW (ref 135–145)

## 2015-06-24 SURGERY — CLOSED REDUCTION, HIP
Anesthesia: General | Site: Hip | Laterality: Left | Wound class: Clean

## 2015-06-24 MED ORDER — GLYCOPYRROLATE 0.2 MG/ML IJ SOLN
INTRAMUSCULAR | Status: DC | PRN
  Administered 2015-06-24: 0.2 mg via INTRAVENOUS

## 2015-06-24 MED ORDER — ACETAMINOPHEN 10 MG/ML IV SOLN
INTRAVENOUS | Status: DC | PRN
  Administered 2015-06-24: 1000 mg via INTRAVENOUS

## 2015-06-24 MED ORDER — ONDANSETRON HCL 4 MG/2ML IJ SOLN: 4.0000 mg | Freq: Once | INTRAMUSCULAR | Status: DC | PRN

## 2015-06-24 MED ORDER — LACTATED RINGERS IV SOLN
INTRAVENOUS | Status: DC | PRN
  Administered 2015-06-24: 18:00:00 via INTRAVENOUS

## 2015-06-24 MED ORDER — ONDANSETRON HCL 4 MG/2ML IJ SOLN
INTRAMUSCULAR | Status: DC | PRN
  Administered 2015-06-24: 4 mg via INTRAVENOUS

## 2015-06-24 MED ORDER — FENTANYL CITRATE (PF) 100 MCG/2ML IJ SOLN
INTRAMUSCULAR | Status: AC
  Filled 2015-06-24: qty 2

## 2015-06-24 MED ORDER — LIDOCAINE HCL 2 % EX GEL
CUTANEOUS | Status: DC | PRN
  Administered 2015-06-24: 1 via TOPICAL

## 2015-06-24 MED ORDER — LIDOCAINE HCL (CARDIAC) 20 MG/ML IV SOLN
INTRAVENOUS | Status: DC | PRN
  Administered 2015-06-24: 80 mg via INTRAVENOUS

## 2015-06-24 MED ORDER — HYDROCODONE-ACETAMINOPHEN 5-325 MG PO TABS: 1.0000 | ORAL_TABLET | ORAL | Status: DC | PRN

## 2015-06-24 MED ORDER — HYDROMORPHONE HCL 1 MG/ML IJ SOLN
1.0000 mg | INTRAMUSCULAR | Status: AC
  Administered 2015-06-24: 1 mg via INTRAVENOUS
  Filled 2015-06-24: qty 1

## 2015-06-24 MED ORDER — MIDAZOLAM HCL 2 MG/2ML IJ SOLN
INTRAMUSCULAR | Status: DC | PRN
  Administered 2015-06-24: 1 mg via INTRAVENOUS

## 2015-06-24 MED ORDER — FENTANYL CITRATE (PF) 100 MCG/2ML IJ SOLN
INTRAMUSCULAR | Status: DC | PRN
  Administered 2015-06-24: 50 ug via INTRAVENOUS

## 2015-06-24 MED ORDER — FENTANYL CITRATE (PF) 100 MCG/2ML IJ SOLN
25.0000 ug | INTRAMUSCULAR | Status: AC | PRN
  Administered 2015-06-24 (×6): 25 ug via INTRAVENOUS
  Filled 2015-06-24: qty 2

## 2015-06-24 MED ORDER — SUCCINYLCHOLINE CHLORIDE 20 MG/ML IJ SOLN
INTRAMUSCULAR | Status: DC | PRN
  Administered 2015-06-24: 40 mg via INTRAVENOUS

## 2015-06-24 MED ORDER — PROPOFOL 10 MG/ML IV BOLUS
INTRAVENOUS | Status: DC | PRN
  Administered 2015-06-24: 110 mg via INTRAVENOUS

## 2015-06-24 SURGICAL SUPPLY — 1 items: IMMBOLIZER KNEE 19 BLUE UNIV (SOFTGOODS) ×1 IMPLANT

## 2015-06-24 NOTE — Op Note (Signed)
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 M.D.  ANESTHESIA: general  ESTIMATED BLOOD LOSS: None   DRAINS: None  INDICATIONS FOR SURGERY: Yvonne Singleton is a 80 y.o. year old female who fell and sustained a dislocated left total hip arthroplasty. After discussion of the risks and benefits of surgical intervention, the patient expressed understanding of the risks benefits and agree with plans for closed reduction of a dislocated left total hip arthroplasty   The risks, benefits, and alternatives were discussed at length including but not limited to the risks of infection, bleeding, nerve injury, stiffness, blood clots, the need for revision surgery, limb length inequality, dislocation, cardiopulmonary complications, among others, and they were willing to proceed.  PROCEDURE IN DETAIL: The patient was brought into the operating room and, after adequate general anesthesia was achieved, a "timeout" was performed as per usual protocol. The left hip was placed in flexion and traction was applied with the hip initially internally rotated and then externally rotated and brought into extension. There was a palpable clunk with restoration of the limb lengths. Position was confirmed in multiple planes using the C-arm. The hip was extremely stable in extension and external rotation. Subluxation was felt with a combination of flexion and internal rotation. A knee immobilizer was applied.  The patient tolerated the procedure well and was transported to the recovery room in stable condition.   Marciano Sequin., M.D.

## 2015-06-24 NOTE — Consult Note (Signed)
ORTHOPAEDIC CONSULTATION  PATIENT NAME: Yvonne Singleton DOB: July 17, 1935  MRN: [id number]  REQUESTING PHYSICIAN: Carrie Mew, MD  Chief Complaint: Left hip pain  HPI: Yvonne Singleton is a 80 y.o. female who complains of  left hip pain after twisting to one side and falling. She has a remote history of left total hip arthroplasty as well as a history of recurrent left hip dislocations. More recently she has had progressive numbness and tingling in the left lower extremity with some weakness. She denies any other injury other than the left hip pain.  Past Medical History  Diagnosis Date  . Gout   . Parkinson's disease   . Back pain   . Cancer     breast  . Hypertension   . Hard of hearing   . Sleep apnea    Past Surgical History  Procedure Laterality Date  . Hip surgery    . Eye surgery Bilateral   . Cataract extraction Bilateral   . Joint replacement      bilateral hip  . Total knee arthroplasty Bilateral   . Abdominal hysterectomy    . Hand surgery     Social History   Social History  . Marital Status: Married    Spouse Name: N/A  . Number of Children: N/A  . Years of Education: N/A   Social History Main Topics  . Smoking status: Never Smoker   . Smokeless tobacco: None  . Alcohol Use: No  . Drug Use: No  . Sexual Activity: Not Asked   Other Topics Concern  . None   Social History Narrative   Family History  Problem Relation Age of Onset  . Heart disease Mother   . Arthritis Mother   . Heart disease Father   . Ulcers Father    Allergies  Allergen Reactions  . Iodinated Diagnostic Agents Hives  . Streptomycin Hives   Prior to Admission medications   Medication Sig Start Date End Date Taking? Authorizing Provider  alendronate (FOSAMAX) 70 MG tablet Take 70 mg by mouth once a week. Take with a full glass of water on an empty stomach.    Historical Provider, MD  allopurinol (ZYLOPRIM) 100 MG tablet Take 100 mg by mouth daily. 2  tablets daily    Historical Provider, MD  aspirin 81 MG tablet Take 81 mg by mouth daily.    Historical Provider, MD  Calcium Carb-Cholecalciferol 600-200 MG-UNIT TABS Take 1 tablet by mouth daily.     Historical Provider, MD  carbidopa-levodopa (SINEMET CR) 50-200 MG per tablet Take 1 tablet by mouth daily.     Historical Provider, MD  cefUROXime (CEFTIN) 250 MG tablet Take 1 tablet (250 mg total) by mouth 2 (two) times daily with a meal. Patient not taking: Reported on 06/16/2015 05/28/15   Mohammed Kindle, MD  conjugated estrogens (PREMARIN) vaginal cream Place 1 Applicatorful vaginally daily. 2 times per week    Historical Provider, MD  folic acid (FOLVITE) 892 MCG tablet Take 400 mcg by mouth daily.    Historical Provider, MD  furosemide (LASIX) 20 MG tablet Take 40 mg by mouth daily.     Historical Provider, MD  gabapentin (NEURONTIN) 100 MG capsule Take 1 capsule (100 mg total) by mouth daily. 05/15/15   Mohammed Kindle, MD  hydroxychloroquine (PLAQUENIL) 200 MG tablet Take 200 mg by mouth daily.    Historical Provider, MD  losartan (COZAAR) 100 MG tablet Take 100 mg by mouth daily.     Historical Provider,  MD  mirabegron ER (MYRBETRIQ) 50 MG TB24 tablet Take 50 mg by mouth daily.    Historical Provider, MD  venlafaxine XR (EFFEXOR-XR) 75 MG 24 hr capsule Take 1 capsule (75 mg total) by mouth daily with breakfast. 05/15/15   Mohammed Kindle, MD   Dg Chest Port 1 View  06/24/2015   CLINICAL DATA:  Preoperative exam.  Patient is status post fall.  EXAM: PORTABLE CHEST - 1 VIEW  COMPARISON:  04/24/2015  FINDINGS: Cardiomediastinal silhouette is normal. Mediastinal contours appear intact.  There is no evidence of focal airspace consolidation, pleural effusion or pneumothorax.  Osseous structures are without acute abnormality. Soft tissues are grossly normal.  IMPRESSION: No radiographic evidence of acute cardiopulmonary abnormality.   Electronically Signed   By: Fidela Salisbury M.D.   On: 06/24/2015  15:24   Dg Hip Unilat With Pelvis 2-3 Views Left  06/24/2015   CLINICAL DATA:  Left hip pain and presumed dislocation  EXAM: DG HIP (WITH OR WITHOUT PELVIS) 2-3V LEFT  COMPARISON:  04/24/2015  FINDINGS: Total bilateral hip arthroplasty with re- current left hip dislocation and superior migration of the prosthetic femoral head. No periprosthetic fracture.  The constrained right hip prosthesis with vertical acetabular component is located.  IMPRESSION: Dislocated prosthetic left hip.   Electronically Signed   By: Monte Fantasia M.D.   On: 06/24/2015 14:36    Positive ROS: All other systems have been reviewed and were otherwise negative with the exception of those mentioned in the HPI and as above.  Physical Exam: General: Alert and alert in no acute distress. HEENT: Atraumatic and normocephalic. Sclera are clear. Extraocular motion is intact. Oropharynx is clear with moist mucosa. Neck: Supple, nontender, good range of motion. No JVD or carotid bruits. Lungs: Clear to auscultation bilaterally. Cardiovascular: Regular rate and rhythm with normal S1 and S2. No murmurs. No gallops or rubs. Pedal pulses are palpable bilaterally. Homans test is negative bilaterally. No significant pretibial or ankle edema. Abdomen: Soft, nontender, and nondistended. Bowel sounds are present. Skin: No lesions in the area of chief complaint Neurologic: Awake, alert, and oriented. Sensory function is grossly intact. Motor strength is felt to be 5 over 5 bilaterally. No clonus or tremor. Good motor coordination. Lymphatic: No axillary or cervical lymphadenopathy  MUSCULOSKELETAL: Examination of the left lower extremity shows the leg to be shortened and rotated. Pain is elicited with any attempted range of motion of the left hip. No knee effusion. No tenderness to the thigh. No pretibial or ankle edema.  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 Given the recurrent nature as well as the complaints of some lower extremity weakness, I have recommended that closed reduction under anesthesia so as to better visualize the hip for stability. I anticipate placing the patient in a brace postoperatively. The risks and benefits of surgical intervention were discussed in detail with the patient. She expressed understanding of the risks benefits and agreed with plans for surgical intervention.  James P. Holley Bouche M.D.

## 2015-06-24 NOTE — Brief Op Note (Signed)
06/24/2015  6:22 PM  PATIENT:  Yvonne Singleton  80 y.o. female  PRE-OPERATIVE DIAGNOSIS:  dislocated left total hip arthroplasty  POST-OPERATIVE DIAGNOSIS:  same  PROCEDURE:  Procedure(s): CLOSED REDUCTION HIP (Left)  SURGEON:  Surgeon(s) and Role:    * Dereck Leep, MD - Primary  ASSISTANTS: none   ANESTHESIA:   general  EBL:   none  BLOOD ADMINISTERED:none  DRAINS: none   LOCAL MEDICATIONS USED:  NONE  SPECIMEN:  No Specimen  DISPOSITION OF SPECIMEN:  N/A  COUNTS:  YES  TOURNIQUET:  * No tourniquets in log *  DICTATION: .Dragon Dictation  PLAN OF CARE: Discharge to home after PACU  PATIENT DISPOSITION:  PACU - hemodynamically stable.   Delay start of Pharmacological VTE agent (>24hrs) due to surgical blood loss or risk of bleeding: not applicable

## 2015-06-24 NOTE — Anesthesia Preprocedure Evaluation (Signed)
Anesthesia Evaluation    Airway Mallampati: II       Dental  (+) Teeth Intact   Pulmonary          Cardiovascular hypertension, Rhythm:regular Rate:Normal     Neuro/Psych    GI/Hepatic   Endo/Other    Renal/GU      Musculoskeletal  (+) Arthritis -, Osteoarthritis,    Abdominal   Peds  Hematology   Anesthesia Other Findings   Reproductive/Obstetrics                             Anesthesia Physical Anesthesia Plan  ASA: III and emergent  Anesthesia Plan: General LMA   Post-op Pain Management:    Induction:   Airway Management Planned:   Additional Equipment:   Intra-op Plan:   Post-operative Plan:   Informed Consent:   Plan Discussed with: CRNA  Anesthesia Plan Comments:         Anesthesia Quick Evaluation

## 2015-06-24 NOTE — ED Provider Notes (Signed)
[facility] Emergency Department Provider Note  ____________________________________________  Time seen: 1:15 PM on arrival by EMS  I have reviewed the triage vital signs and the nursing notes.   HISTORY  Chief Complaint Fall and Hip Pain    HPI Yvonne Singleton is a 80 y.o. female is brought to the ED by EMS after a fall. She has a history of multiple left hip prosthesis dislocations, managed by Dr. Marry Guan. She had been in a knee immobilizer for several weeks and this was just discontinued for the last 2 weeks. She's been ambulating with crutches since then. Today she was up and about in her home, and was turning when she had sudden pain in the left hip causing her to fall down. She was then able to get back up so she laid on her back from 10:30 until noon when her husband got home and found her. She does complain of severe pain in the left hip particularly with movement. The patient was given 100 g of fentanyl en route to the ED by EMS, but complains of 8 out of 10 pain in the hip currently.    Past Medical History  Diagnosis Date  . Gout   . Parkinson's disease   . Back pain   . Cancer     breast  . Hypertension   . Hard of hearing   . Sleep apnea      Patient Active Problem List   Diagnosis Date Noted  . History of bilateral hip replacements 05/28/2015  . Spinal stenosis, lumbar region, with neurogenic claudication 05/28/2015  . DDD (degenerative disc disease), lumbar 03/13/2015  . Lumbar post-laminectomy syndrome 03/13/2015  . Degenerative cervical disc 03/13/2015  . Status post bilateral total hip replacement 03/13/2015  . Sacroiliac joint dysfunction 03/13/2015  . History of surgery on upper extremity 03/13/2015  . DJD of shoulder 03/13/2015  . Rheumatoid arthritis 02/17/2015     Past Surgical History  Procedure Laterality Date  . Hip surgery    . Eye surgery Bilateral   . Cataract extraction Bilateral   . Joint replacement       bilateral hip  . Total knee arthroplasty Bilateral   . Abdominal hysterectomy    . Hand surgery       Current Outpatient Rx  Name  Route  Sig  Dispense  Refill  . alendronate (FOSAMAX) 70 MG tablet   Oral   Take 70 mg by mouth once a week. Take with a full glass of water on an empty stomach.         Marland Kitchen allopurinol (ZYLOPRIM) 100 MG tablet   Oral   Take 100 mg by mouth daily. 2 tablets daily         . aspirin 81 MG tablet   Oral   Take 81 mg by mouth daily.         . Calcium Carb-Cholecalciferol 600-200 MG-UNIT TABS   Oral   Take 1 tablet by mouth daily.          . carbidopa-levodopa (SINEMET CR) 50-200 MG per tablet   Oral   Take 1 tablet by mouth daily.          . cefUROXime (CEFTIN) 250 MG tablet   Oral   Take 1 tablet (250 mg total) by mouth 2 (two) times daily with a meal. Patient not taking: Reported on 06/16/2015   14 tablet   0   . conjugated estrogens (PREMARIN) vaginal cream   Vaginal  Place 1 Applicatorful vaginally daily. 2 times per week         . folic acid (FOLVITE) 258 MCG tablet   Oral   Take 400 mcg by mouth daily.         . furosemide (LASIX) 20 MG tablet   Oral   Take 40 mg by mouth daily.          Marland Kitchen gabapentin (NEURONTIN) 100 MG capsule   Oral   Take 1 capsule (100 mg total) by mouth daily.   90 capsule   0   . hydroxychloroquine (PLAQUENIL) 200 MG tablet   Oral   Take 200 mg by mouth daily.         Marland Kitchen losartan (COZAAR) 100 MG tablet   Oral   Take 100 mg by mouth daily.          . mirabegron ER (MYRBETRIQ) 50 MG TB24 tablet   Oral   Take 50 mg by mouth daily.         Marland Kitchen venlafaxine XR (EFFEXOR-XR) 75 MG 24 hr capsule   Oral   Take 1 capsule (75 mg total) by mouth daily with breakfast.   90 capsule   0      Allergies Iodinated diagnostic agents and Streptomycin   Family History  Problem Relation Age of Onset  . Heart disease Mother   . Arthritis Mother   . Heart disease Father   . Ulcers  Father     Social History Social History  Substance Use Topics  . Smoking status: Never Smoker   . Smokeless tobacco: None  . Alcohol Use: No    Review of Systems  Constitutional:   No fever or chills. No weight changes Eyes:   No blurry vision or double vision.  ENT:   No sore throat. Cardiovascular:   No chest pain. Respiratory:   No dyspnea or cough. Gastrointestinal:   Negative for abdominal pain, vomiting and diarrhea.  No BRBPR or melena. Genitourinary:   Negative for dysuria, urinary retention, bloody urine, or difficulty urinating. Musculoskeletal:   Acute on chronic left hip pain. Skin:   Negative for rash. Neurological:   Negative for headaches, focal weakness or numbness. Psychiatric:  No anxiety or depression.   Endocrine:  No hot/cold intolerance, changes in energy, or sleep difficulty.  10-point ROS otherwise negative.  ____________________________________________   PHYSICAL EXAM:  VITAL SIGNS: ED Triage Vitals  Enc Vitals Group     BP 06/24/15 1337 137/69 mmHg     Pulse Rate 06/24/15 1337 58     Resp 06/24/15 1337 18     Temp 06/24/15 1337 98.9 F (37.2 C)     Temp Source 06/24/15 1337 Oral     SpO2 06/24/15 1337 97 %     Weight 06/24/15 1337 179 lb (81.194 kg)     Height 06/24/15 1337 5\' 3"  (1.6 m)     Head Cir --      Peak Flow --      Pain Score 06/24/15 1339 7     Pain Loc --      Pain Edu? --      Excl. in [place]? --      Constitutional:   Alert and oriented. Well appearing and in no distress. Eyes:   No scleral icterus. No conjunctival pallor. PERRL. EOMI ENT   Head:   Normocephalic and atraumatic.   Nose:   No congestion/rhinnorhea. No septal hematoma   Mouth/Throat:   MMM, no pharyngeal  erythema. No peritonsillar mass. No uvula shift.   Neck:   No stridor. No SubQ emphysema. No meningismus. No C-spine tenderness. Full range of motion Hematological/Lymphatic/Immunilogical:   No cervical lymphadenopathy. Cardiovascular:    RRR. Normal and symmetric distal pulses are present in all extremities. No murmurs, rubs, or gallops. Respiratory:   Normal respiratory effort without tachypnea nor retractions. Breath sounds are clear and equal bilaterally. No wheezes/rales/rhonchi. Gastrointestinal:   Soft and nontender. No distention. There is no CVA tenderness.  No rebound, rigidity, or guarding. Genitourinary:   deferred Musculoskeletal:  Shortening and internal rotation of the left leg. The left femur is palpable posterior to the hip. There is pain on palpation of the left femoral neck region. Pain with movement of the left leg. Neurologic:   Normal speech and language.  CN 2-10 normal. Motor grossly intact. No gross focal neurologic deficits are appreciated.  Skin:    Skin is warm, dry and intact. No rash noted.  No petechiae, purpura, or bullae. Psychiatric:   Mood and affect are normal. Speech and behavior are normal. Patient exhibits appropriate insight and judgment.  ____________________________________________    LABS (pertinent positives/negatives) (all labs ordered are listed, but only abnormal results are displayed) Labs Reviewed  URINALYSIS COMPLETEWITH MICROSCOPIC ([place]) - Abnormal; Notable for the following:    Color, Urine STRAW (*)    APPearance CLEAR (*)    Hgb urine dipstick 1+ (*)    Squamous Epithelial / LPF 0-5 (*)    All other components within normal limits  BASIC METABOLIC PANEL  CBC WITH DIFFERENTIAL/PLATELET   ____________________________________________   EKG    ____________________________________________    RADIOLOGY  X-ray left hip shows posterior dislocation of the hip prosthesis  ____________________________________________   PROCEDURES   ____________________________________________   INITIAL IMPRESSION / ASSESSMENT AND PLAN / ED COURSE  Pertinent labs & imaging results that were available during my care of the patient were reviewed by me and considered in  my medical decision making (see chart for details).  Patient presents with clinically suspected left hip prosthesis dislocation. We'll check an x-ray and give her IV Dilaudid for pain control.  ----------------------------------------- 3:06 PM on 06/24/2015 -----------------------------------------  Cased was discussed with Dr. Marry Guan who is the patient's orthopedic surgeon. He notes that due to this recurrent issue despite using knee immobilizers and crutches, the patient is continuing to have this and he'll possibly need to do a fluoroscopic evaluation to determine the extent of instability and possible surgical management. We'll keep the patient nothing by mouth and establish an IV and sent some basic preop labs. She is hemodynamically stable at this time. Pain is under control.   ____________________________________________   FINAL CLINICAL IMPRESSION(S) / ED DIAGNOSES  Final diagnoses:  Dislocated hip, left, initial encounter      Carrie Mew, MD 06/24/15 [phone]

## 2015-06-24 NOTE — ED Notes (Signed)
Pt to ED via EMS transport from home, fell around 1030 this am and laid in the floor till her husband got there at 70. Pt states she thinks her left hip dislocated and that is what caused her to fall, has had her left hip dislocated multiple times in the past, EMS gave pt a total of 100 mcq of Fentanyl in route to hospital

## 2015-06-24 NOTE — Transfer of Care (Signed)
Immediate Anesthesia Transfer of Care Note  Patient: Yvonne Singleton  Procedure(s) Performed: Procedure(s): CLOSED REDUCTION HIP (Left)  Patient Location: PACU  Anesthesia Type:General  Level of Consciousness: sedated  Airway & Oxygen Therapy: Patient Spontanous Breathing and Patient connected to face mask oxygen  Post-op Assessment: Report given to RN and Post -op Vital signs reviewed and stable  Post vital signs: Reviewed and stable  Last Vitals:  Filed Vitals:   06/24/15 1600  BP: 144/68  Pulse: 66  Temp:   Resp: 16    Complications: No apparent anesthesia complications

## 2015-06-24 NOTE — Anesthesia Procedure Notes (Signed)
Procedure Name: LMA Insertion Date/Time: 06/24/2015 5:52 PM Performed by: Doreen Salvage Pre-anesthesia Checklist: Patient identified, Patient being monitored, Timeout performed, Emergency Drugs available and Suction available Patient Re-evaluated:Patient Re-evaluated prior to inductionOxygen Delivery Method: Circle system utilized Preoxygenation: Pre-oxygenation with 100% oxygen Intubation Type: IV induction Ventilation: Mask ventilation without difficulty LMA: LMA inserted LMA Size: 3.5 Tube type: Oral Number of attempts: 1 Placement Confirmation: positive ETCO2 and breath sounds checked- equal and bilateral Tube secured with: Tape Dental Injury: Teeth and Oropharynx as per pre-operative assessment

## 2015-06-24 NOTE — Discharge Instructions (Signed)
Hip Dislocation A hip dislocation happens when your thigh bone (the ball) separates from your hip bone (the socket). Hip dislocation is an emergency. If you think you have a hip dislocation and cannot move your leg, get help right away. Do not try to move.  Your doctor will put your thigh bone back in the joint (the ball back in the socket). Sometimes this can be done without surgery. Surgery may be needed if blood vessels or nerves are damaged, or if the ball cannot be put back into the socket by hand. HOME CARE  Rest your injured joint. Do not move it.  Avoid the activity that caused your injury.  Put ice on your injured joint for 1 to 2 days or as told by your doctor.  Put ice in a plastic bag.  Place a towel between your skin and the bag.  Leave the ice on for 15 to 20 minutes, every 2 hours while you are awake.  Use crutches or a walker as told by your doctor.  Exercise your hip and leg as told by your doctor.  Only take medicines as told by your doctor. GET HELP RIGHT AWAY IF:  Your pain gets worse, not better.  You feel like your hip has dislocated again. MAKE SURE YOU:  Understand these instructions.  Will watch your condition.  Will get help right away if you are not doing well or get worse. Document Released: 06/02/2011 Document Revised: 12/27/2011 Document Reviewed: 06/02/2011 [facility] Patient Information 2015 [place], [place]. This information is not intended to replace advice given to you by your health care provider. Make sure you discuss any questions you have with your health care provider. AMBULATORY SURGERY  DISCHARGE INSTRUCTIONS   1) The drugs that you were given will stay in your system until tomorrow so for the next 24 hours you should not:  A) Drive an automobile B) Make any legal decisions C) Drink any alcoholic beverage   2) You may resume regular meals tomorrow.  Today it is better to start with liquids and gradually work up to solid  foods.  You may eat anything you prefer, but it is better to start with liquids, then soup and crackers, and gradually work up to solid foods.   3) Please notify your doctor immediately if you have any unusual bleeding, trouble breathing, redness and pain at the surgery site, drainage, fever, or pain not relieved by medication.    4) Additional Instructions:        Please contact your physician with any problems or Same Day Surgery at [phone], Monday through Friday 6 am to 4 pm, or [place] at [facility] number at [phone].

## 2015-06-25 ENCOUNTER — Encounter: Payer: Self-pay | Admitting: Orthopedic Surgery

## 2015-06-26 NOTE — Anesthesia Postprocedure Evaluation (Signed)
  Anesthesia Post-op Note  Patient: Yvonne Singleton  Procedure(s) Performed: Procedure(s): CLOSED REDUCTION HIP (Left)  Anesthesia type:General LMA  Patient location: PACU  Post pain: Pain level controlled  Post assessment: Post-op Vital signs reviewed, Patient's Cardiovascular Status Stable, Respiratory Function Stable, Patent Airway and No signs of Nausea or vomiting  Post vital signs: Reviewed and stable  Last Vitals:  Filed Vitals:   06/24/15 1907  BP:   Pulse:   Temp: 37.3 C  Resp:     Level of consciousness: awake, alert  and patient cooperative  Complications: No apparent anesthesia complications

## 2015-07-08 ENCOUNTER — Ambulatory Visit
Admission: RE | Admit: 2015-07-08 | Discharge: 2015-07-08 | Disposition: A | Discharge status: Home / Self Care | Payer: Medicare Other | Source: Ambulatory Visit | Attending: Pain Medicine | Admitting: Pain Medicine

## 2015-07-08 DIAGNOSIS — Z9889 Other specified postprocedural states: Secondary | ICD-10-CM

## 2015-07-08 DIAGNOSIS — M5030 Other cervical disc degeneration, unspecified cervical region: Secondary | ICD-10-CM

## 2015-07-08 DIAGNOSIS — M5126 Other intervertebral disc displacement, lumbar region: Secondary | ICD-10-CM | POA: Diagnosis not present

## 2015-07-08 DIAGNOSIS — M533 Sacrococcygeal disorders, not elsewhere classified: Secondary | ICD-10-CM

## 2015-07-08 DIAGNOSIS — M51369 Other intervertebral disc degeneration, lumbar region without mention of lumbar back pain or lower extremity pain: Secondary | ICD-10-CM

## 2015-07-08 DIAGNOSIS — M961 Postlaminectomy syndrome, not elsewhere classified: Secondary | ICD-10-CM

## 2015-07-08 DIAGNOSIS — M4806 Spinal stenosis, lumbar region: Secondary | ICD-10-CM | POA: Diagnosis not present

## 2015-07-08 DIAGNOSIS — Z96643 Presence of artificial hip joint, bilateral: Secondary | ICD-10-CM | POA: Diagnosis not present

## 2015-07-08 DIAGNOSIS — M19012 Primary osteoarthritis, left shoulder: Secondary | ICD-10-CM

## 2015-07-08 DIAGNOSIS — M19011 Primary osteoarthritis, right shoulder: Secondary | ICD-10-CM

## 2015-07-08 DIAGNOSIS — M5136 Other intervertebral disc degeneration, lumbar region: Secondary | ICD-10-CM | POA: Diagnosis present

## 2015-07-08 DIAGNOSIS — M48062 Spinal stenosis, lumbar region with neurogenic claudication: Secondary | ICD-10-CM

## 2015-07-08 NOTE — Progress Notes (Signed)
Patient states her hip has recently dislocated again, seeing Dr. Marry Guan. Dr. Primus Bravo notified, prefers not to do the [place] until cleared by Dr. Marry Guan. Dr. Primus Bravo also recommended pt see a surgeon. Pt declined to do that until a later time.

## 2015-07-14 ENCOUNTER — Ambulatory Visit: Payer: Medicare Other | Attending: Pain Medicine | Admitting: Pain Medicine

## 2015-07-14 ENCOUNTER — Encounter: Payer: Self-pay | Admitting: Pain Medicine

## 2015-07-14 VITALS — BP 116/64 | HR 73 | Temp 98.3°F | Resp 18 | Ht 63.0 in | Wt 182.0 lb

## 2015-07-14 DIAGNOSIS — M79604 Pain in right leg: Secondary | ICD-10-CM | POA: Diagnosis present

## 2015-07-14 DIAGNOSIS — M5030 Other cervical disc degeneration, unspecified cervical region: Secondary | ICD-10-CM

## 2015-07-14 DIAGNOSIS — M19012 Primary osteoarthritis, left shoulder: Secondary | ICD-10-CM

## 2015-07-14 DIAGNOSIS — M533 Sacrococcygeal disorders, not elsewhere classified: Secondary | ICD-10-CM | POA: Diagnosis not present

## 2015-07-14 DIAGNOSIS — M47816 Spondylosis without myelopathy or radiculopathy, lumbar region: Secondary | ICD-10-CM | POA: Diagnosis not present

## 2015-07-14 DIAGNOSIS — X58XXXA Exposure to other specified factors, initial encounter: Secondary | ICD-10-CM | POA: Diagnosis not present

## 2015-07-14 DIAGNOSIS — M19019 Primary osteoarthritis, unspecified shoulder: Secondary | ICD-10-CM | POA: Insufficient documentation

## 2015-07-14 DIAGNOSIS — M961 Postlaminectomy syndrome, not elsewhere classified: Secondary | ICD-10-CM

## 2015-07-14 DIAGNOSIS — T84020A Dislocation of internal right hip prosthesis, initial encounter: Secondary | ICD-10-CM | POA: Insufficient documentation

## 2015-07-14 DIAGNOSIS — Z96649 Presence of unspecified artificial hip joint: Secondary | ICD-10-CM | POA: Diagnosis not present

## 2015-07-14 DIAGNOSIS — M5136 Other intervertebral disc degeneration, lumbar region: Secondary | ICD-10-CM | POA: Insufficient documentation

## 2015-07-14 DIAGNOSIS — Z9889 Other specified postprocedural states: Secondary | ICD-10-CM | POA: Diagnosis not present

## 2015-07-14 DIAGNOSIS — Z96643 Presence of artificial hip joint, bilateral: Secondary | ICD-10-CM

## 2015-07-14 DIAGNOSIS — M19011 Primary osteoarthritis, right shoulder: Secondary | ICD-10-CM

## 2015-07-14 DIAGNOSIS — M545 Low back pain: Secondary | ICD-10-CM | POA: Diagnosis present

## 2015-07-14 DIAGNOSIS — M51369 Other intervertebral disc degeneration, lumbar region without mention of lumbar back pain or lower extremity pain: Secondary | ICD-10-CM

## 2015-07-14 DIAGNOSIS — M79605 Pain in left leg: Secondary | ICD-10-CM | POA: Diagnosis present

## 2015-07-14 DIAGNOSIS — M48062 Spinal stenosis, lumbar region with neurogenic claudication: Secondary | ICD-10-CM

## 2015-07-14 MED ORDER — OXYCODONE HCL 5 MG PO TABS
ORAL_TABLET | ORAL | Status: DC
Start: 2015-07-14 — End: 2015-08-12

## 2015-07-14 MED ORDER — GABAPENTIN 100 MG PO CAPS: 100.0000 mg | ORAL_CAPSULE | Freq: Every day | ORAL | Status: DC

## 2015-07-14 MED ORDER — VENLAFAXINE HCL ER 75 MG PO CP24: 75.0000 mg | ORAL_CAPSULE | Freq: Every day | ORAL | Status: DC

## 2015-07-14 NOTE — Patient Instructions (Signed)
PLAN   Continue present medication Effexor Neurontin and oxycodone  F/U PCP Dr.N Humphrey Rolls  for evaliation of  BP and general medical  condition  F/U surgical evaluation. Follow-up Dr. Marry Guan as planned  F/U neurological evaluation. May consider pending follow-up evaluations  May consider radiofrequency rhizolysis or intraspinal procedures pending response to present treatment and F/U evaluation   Patient to call [facility] should patient have concerns prior to scheduled return appointment.

## 2015-07-14 NOTE — Progress Notes (Signed)
Safety precautions to be maintained throughout the outpatient stay will include: orient to surroundings, keep bed in low position, maintain call bell within reach at all times, provide assistance with transfer out of bed and ambulation.  

## 2015-07-14 NOTE — Progress Notes (Signed)
   Subjective:    Patient ID: Yvonne Singleton, female    DOB: Dec 25, 1934, 80 y.o.   MRN: [id number]  HPI Patient is 80 year old female returns to [facility] for follow-up evaluation and treatment of pain involving the region of the lower back lower extremity regions. Patient is status post total hip replacements as well as prior surgical intervention of the lumbar region. At the present time patient is with displaced prior hip replacement. We will avoid interventional treatment and continue medications as prescribed. Consider interventional treatment for lumbar lower extremity pain once patient's dislocated hip is resolved. We will continue Effexor Neurontin and oxycodone. The patient is in agreement with suggested treatment plan.    Review of Systems     Objective:   Physical Exam  There was tenderness of the splenius capitis and occipitalis musculature regions of mild degree with mild tenderness over the region of the trapezius levator scapula and rhomboid musculature regions. There was tenderness of the acromial clavicular glenohumeral joint region. Patient appeared to be with unremarkable Spurling's maneuver with decreased grip strength. Tinel and Phalen's maneuver associated with moderate discomfort. Well-healed surgical scars of the upper extremities were noted. There was no increased warmth or erythema of the upper extremities noted. Palpation over the thoracic facet thoracic paraspinal musculature region was without severe tenderness to palpation there was no crepitus of the thoracic region noted. Palpation over the lumbar paraspinal muscular region lumbar facet region associated with moderate discomfort. Rotation and lateral bending associated with moderate discomfort as well as tenderness to palpation over the PSIS PII S region gluteal and piriformis musculature regions. Straight leg raising was limited to approximately 20 without increased pain with dorsiflexion noted.  There was negative clonus negative Homans. Dominant nontender no costovertebral tenderness noted    Assessment & Plan:    Degenerative changes lumbar spine L2-3, L3-4 degenerative changes with postoperative changes noted bilaterally, multilevel degenerative changes noted throughout the lumbar spine with neural foraminal narrowing and disc herniations as well  Status post total hip replacement with dislocation at this time  DJD of shoulder  Status post upper extremity surgery  Sacroiliac joint dysfunction    PLAN   Continue present medication Effexor Neurontin and oxycodone  F/U PCP Dr Wolfgang Phoenix  for evaliation of  BP and general medical  condition  F/U surgical evaluation with Dr.Hooten as planned   F/U neurological evaluation. May consider pending follow-up evaluations  May consider radiofrequency rhizolysis or intraspinal procedures pending response to present treatment and F/U evaluation   Patient to call [facility] should patient have concerns prior to scheduled return appointment.

## 2015-08-12 ENCOUNTER — Encounter: Payer: Self-pay | Admitting: Pain Medicine

## 2015-08-12 ENCOUNTER — Ambulatory Visit: Payer: Medicare Other | Attending: Pain Medicine | Admitting: Pain Medicine

## 2015-08-12 VITALS — BP 100/58 | HR 56 | Temp 98.3°F | Resp 14 | Ht 63.0 in | Wt 182.0 lb

## 2015-08-12 DIAGNOSIS — M19012 Primary osteoarthritis, left shoulder: Secondary | ICD-10-CM

## 2015-08-12 DIAGNOSIS — M19011 Primary osteoarthritis, right shoulder: Secondary | ICD-10-CM

## 2015-08-12 DIAGNOSIS — M5136 Other intervertebral disc degeneration, lumbar region: Secondary | ICD-10-CM

## 2015-08-12 DIAGNOSIS — M5030 Other cervical disc degeneration, unspecified cervical region: Secondary | ICD-10-CM

## 2015-08-12 DIAGNOSIS — Z79899 Other long term (current) drug therapy: Secondary | ICD-10-CM | POA: Diagnosis not present

## 2015-08-12 DIAGNOSIS — M961 Postlaminectomy syndrome, not elsewhere classified: Secondary | ICD-10-CM

## 2015-08-12 DIAGNOSIS — M4806 Spinal stenosis, lumbar region: Secondary | ICD-10-CM | POA: Diagnosis not present

## 2015-08-12 DIAGNOSIS — M5126 Other intervertebral disc displacement, lumbar region: Secondary | ICD-10-CM | POA: Diagnosis not present

## 2015-08-12 DIAGNOSIS — M51369 Other intervertebral disc degeneration, lumbar region without mention of lumbar back pain or lower extremity pain: Secondary | ICD-10-CM

## 2015-08-12 DIAGNOSIS — Z9889 Other specified postprocedural states: Secondary | ICD-10-CM

## 2015-08-12 DIAGNOSIS — M48062 Spinal stenosis, lumbar region with neurogenic claudication: Secondary | ICD-10-CM

## 2015-08-12 DIAGNOSIS — M19019 Primary osteoarthritis, unspecified shoulder: Secondary | ICD-10-CM | POA: Insufficient documentation

## 2015-08-12 DIAGNOSIS — Z96643 Presence of artificial hip joint, bilateral: Secondary | ICD-10-CM

## 2015-08-12 DIAGNOSIS — Z96649 Presence of unspecified artificial hip joint: Secondary | ICD-10-CM | POA: Insufficient documentation

## 2015-08-12 DIAGNOSIS — M533 Sacrococcygeal disorders, not elsewhere classified: Secondary | ICD-10-CM

## 2015-08-12 MED ORDER — OXYCODONE HCL 5 MG PO TABS: ORAL_TABLET | ORAL | Status: DC

## 2015-08-12 NOTE — Patient Instructions (Addendum)
PLAN   Continue present medication Effexor Neurontin and oxycodone  Lumbar epidural steroid injection to be performed at time return appointment  F/U PCP Dr.N Humphrey Rolls  for evaliation of  BP and general medical condition as discussed  F/U surgical evaluation. Follow-up Dr. Marry Guan as planned  F/U Dr. Margaretmary Eddy as discussed  F/U Dr. Merita Norton as discussed  F/U neurological evaluation. Follow-up with Dr. Manuella Ghazi as discussed  May consider radiofrequency rhizolysis or intraspinal procedures pending response to present treatment and F/U evaluation   Patient to call [facility] should patient have concerns prior to scheduled return appointment.Epidural Steroid Injection Patient Information  Description: The epidural space surrounds the nerves as they exit the spinal cord.  In some patients, the nerves can be compressed and inflamed by a bulging disc or a tight spinal canal (spinal stenosis).  By injecting steroids into the epidural space, we can bring irritated nerves into direct contact with a potentially helpful medication.  These steroids act directly on the irritated nerves and can reduce swelling and inflammation which often leads to decreased pain.  Epidural steroids may be injected anywhere along the spine and from the neck to the low back depending upon the location of your pain.   After numbing the skin with local anesthetic (like Novocaine), a small needle is passed into the epidural space slowly.  You may experience a sensation of pressure while this is being done.  The entire block usually last less than 10 minutes.  Conditions which may be treated by epidural steroids:   Low back and leg pain  Neck and arm pain  Spinal stenosis  Post-laminectomy syndrome  Herpes zoster (shingles) pain  Pain from compression fractures  Preparation for the injection:  1. Do not eat any solid food or dairy products within 6 hours of your appointment.  2. You may drink clear  liquids up to 2 hours before appointment.  Clear liquids include water, black coffee, juice or soda.  No milk or cream please. 3. You may take your regular medication, including pain medications, with a sip of water before your appointment  Diabetics should hold regular insulin (if taken separately) and take 1/2 normal NPH dos the morning of the procedure.  Carry some sugar containing items with you to your appointment. 4. A driver must accompany you and be prepared to drive you home after your procedure.  5. Bring all your current medications with your. 6. An IV may be inserted and sedation may be given at the discretion of the physician.   7. A blood pressure cuff, EKG and other monitors will often be applied during the procedure.  Some patients may need to have extra oxygen administered for a short period. 8. You will be asked to provide medical information, including your allergies, prior to the procedure.  We must know immediately if you are taking blood thinners (like Coumadin/Warfarin)  Or if you are allergic to IV iodine contrast (dye). We must know if you could possible be pregnant.  Possible side-effects:  Bleeding from needle site  Infection (rare, may require surgery)  Nerve injury (rare)  Numbness & tingling (temporary)  Difficulty urinating (rare, temporary)  Spinal headache ( a headache worse with upright posture)  Light -headedness (temporary)  Pain at injection site (several days)  Decreased blood pressure (temporary)  Weakness in arm/leg (temporary)  Pressure sensation in back/neck (temporary)  Call if you experience:  Fever/chills associated with headache or increased back/neck pain.  Headache worsened by  an upright position.  New onset weakness or numbness of an extremity below the injection site  Hives or difficulty breathing (go to the emergency room)  Inflammation or drainage at the infection site  Severe back/neck pain  Any new symptoms which are  concerning to you  Please note:  Although the local anesthetic injected can often make your back or neck feel good for several hours after the injection, the pain will likely return.  It takes 3-7 days for steroids to work in the epidural space.  You may not notice any pain relief for at least that one week.  If effective, we will often do a series of three injections spaced 3-6 weeks apart to maximally decrease your pain.  After the initial series, we generally will wait several months before considering a repeat injection of the same type.  If you have any questions, please call [phone] [place]  What are the risk, side effects and possible complications? Generally speaking, most procedures are safe.  However, with any procedure there are risks, side effects, and the possibility of complications.  The risks and complications are dependent upon the sites that are lesioned, or the type of nerve block to be performed.  The closer the procedure is to the spine, the more serious the risks are.  Great care is taken when placing the radio frequency needles, block needles or lesioning probes, but sometimes complications can occur. 1. Infection: Any time there is an injection through the skin, there is a risk of infection.  This is why sterile conditions are used for these blocks.  There are four possible types of infection. 1. Localized skin infection. 2. Central Nervous System Infection-This can be in the form of Meningitis, which can be deadly. 3. Epidural Infections-This can be in the form of an epidural abscess, which can cause pressure inside of the spine, causing compression of the spinal cord with subsequent paralysis. This would require an emergency surgery to decompress, and there are no guarantees that the patient would recover from the paralysis. 4. Discitis-This is an infection of the intervertebral discs.  It occurs in  about 1% of discography procedures.  It is difficult to treat and it may lead to surgery.        2. Pain: the needles have to go through skin and soft tissues, will cause soreness.       3. Damage to internal structures:  The nerves to be lesioned may be near blood vessels or    other nerves which can be potentially damaged.       4. Bleeding: Bleeding is more common if the patient is taking blood thinners such as  aspirin, Coumadin, Ticiid, Plavix, etc., or if he/she have some genetic predisposition  such as hemophilia. Bleeding into the spinal canal can cause compression of the spinal  cord with subsequent paralysis.  This would require an emergency surgery to  decompress and there are no guarantees that the patient would recover from the  paralysis.       5. Pneumothorax:  Puncturing of a lung is a possibility, every time a needle is introduced in  the area of the chest or upper back.  Pneumothorax refers to free air around the  collapsed lung(s), inside of the thoracic cavity (chest cavity).  Another two possible  complications related to a similar event would include: Hemothorax and Chylothorax.   These are variations of the Pneumothorax, where instead of  air around the collapsed  lung(s), you may have blood or chyle, respectively.       6. Spinal headaches: They may occur with any procedures in the area of the spine.       7. Persistent CSF (Cerebro-Spinal Fluid) leakage: This is a rare problem, but may occur  with prolonged intrathecal or epidural catheters either due to the formation of a fistulous  track or a dural tear.       8. Nerve damage: By working so close to the spinal cord, there is always a possibility of  nerve damage, which could be as serious as a permanent spinal cord injury with  paralysis.       9. Death:  Although rare, severe deadly allergic reactions known as "Anaphylactic  reaction" can occur to any of the medications used.      10. Worsening of the symptoms:  We can always  make thing worse.  What are the chances of something like this happening? Chances of any of this occuring are extremely low.  By statistics, you have more of a chance of getting killed in a motor vehicle accident: while driving to the hospital than any of the above occurring .  Nevertheless, you should be aware that they are possibilities.  In general, it is similar to taking a shower.  Everybody knows that you can slip, hit your head and get killed.  Does that mean that you should not shower again?  Nevertheless always keep in mind that statistics do not mean anything if you happen to be on the wrong side of them.  Even if a procedure has a 1 (one) in a 1,000,000 (million) chance of going wrong, it you happen to be that one..Also, keep in mind that by statistics, you have more of a chance of having something go wrong when taking medications.  Who should not have this procedure? If you are on a blood thinning medication (e.g. Coumadin, Plavix, see list of "Blood Thinners"), or if you have an active infection going on, you should not have the procedure.  If you are taking any blood thinners, please inform your physician.  How should I prepare for this procedure?  Do not eat or drink anything at least six hours prior to the procedure.  Bring a driver with you .  It cannot be a taxi.  Come accompanied by an adult that can drive you back, and that is strong enough to help you if your legs get weak or numb from the local anesthetic.  Take all of your medicines the morning of the procedure with just enough water to swallow them.  If you have diabetes, make sure that you are scheduled to have your procedure done first thing in the morning, whenever possible.  If you have diabetes, take only half of your insulin dose and notify our nurse that you have done so as soon as you arrive at the clinic.  If you are diabetic, but only take blood sugar pills (oral hypoglycemic), then do not take them on the  morning of your procedure.  You may take them after you have had the procedure.  Do not take aspirin or any aspirin-containing medications, at least eleven (11) days prior to the procedure.  They may prolong bleeding.  Wear loose fitting clothing that may be easy to take off and that you would not mind if it got stained with Betadine or blood.  Do not wear any jewelry or perfume  Remove  any nail coloring.  It will interfere with some of our monitoring equipment.  NOTE: Remember that this is not meant to be interpreted as a complete list of all possible complications.  Unforeseen problems may occur.  BLOOD THINNERS The following drugs contain aspirin or other products, which can cause increased bleeding during surgery and should not be taken for 2 weeks prior to and 1 week after surgery.  If you should need take something for relief of minor pain, you may take acetaminophen which is found in Tylenol,m Datril, Anacin-3 and Panadol. It is not blood thinner. The products listed below are.  Do not take any of the products listed below in addition to any listed on your instruction sheet.  A.P.C or A.P.C with Codeine Codeine Phosphate Capsules #3 Ibuprofen Ridaura  ABC compound Congesprin Imuran rimadil  Advil Cope Indocin Robaxisal  Alka-Seltzer Effervescent Pain Reliever and Antacid Coricidin or Coricidin-D  Indomethacin Rufen  Alka-Seltzer plus Cold Medicine Cosprin Ketoprofen S-A-C Tablets  Anacin Analgesic Tablets or Capsules Coumadin Korlgesic Salflex  Anacin Extra Strength Analgesic tablets or capsules CP-2 Tablets Lanoril Salicylate  Anaprox Cuprimine Capsules Levenox Salocol  Anexsia-D Dalteparin Magan Salsalate  Anodynos Darvon compound Magnesium Salicylate Sine-off  Ansaid Dasin Capsules Magsal Sodium Salicylate  Anturane Depen Capsules Marnal Soma  APF Arthritis pain formula Dewitt's Pills Measurin Stanback  Argesic Dia-Gesic Meclofenamic Sulfinpyrazone  Arthritis Bayer Timed  Release Aspirin Diclofenac Meclomen Sulindac  Arthritis pain formula Anacin Dicumarol Medipren Supac  Analgesic (Safety coated) Arthralgen Diffunasal Mefanamic Suprofen  Arthritis Strength Bufferin Dihydrocodeine Mepro Compound Suprol  Arthropan liquid Dopirydamole Methcarbomol with Aspirin Synalgos  ASA tablets/Enseals Disalcid Micrainin Tagament  Ascriptin Doan's Midol Talwin  Ascriptin A/D Dolene Mobidin Tanderil  Ascriptin Extra Strength Dolobid Moblgesic Ticlid  Ascriptin with Codeine Doloprin or Doloprin with Codeine Momentum Tolectin  Asperbuf Duoprin Mono-gesic Trendar  Aspergum Duradyne Motrin or Motrin IB Triminicin  Aspirin plain, buffered or enteric coated Durasal Myochrisine Trigesic  Aspirin Suppositories Easprin Nalfon Trillsate  Aspirin with Codeine Ecotrin Regular or Extra Strength Naprosyn Uracel  Atromid-S Efficin Naproxen Ursinus  Auranofin Capsules Elmiron Neocylate Vanquish  Axotal Emagrin Norgesic Verin  Azathioprine Empirin or Empirin with Codeine Normiflo Vitamin E  Azolid Emprazil Nuprin Voltaren  Bayer Aspirin plain, buffered or children's or timed BC Tablets or powders Encaprin Orgaran Warfarin Sodium  Buff-a-Comp Enoxaparin Orudis Zorpin  Buff-a-Comp with Codeine Equegesic Os-Cal-Gesic   Buffaprin Excedrin plain, buffered or Extra Strength Oxalid   Bufferin Arthritis Strength Feldene Oxphenbutazone   Bufferin plain or Extra Strength Feldene Capsules Oxycodone with Aspirin   Bufferin with Codeine Fenoprofen Fenoprofen Pabalate or Pabalate-SF   Buffets II Flogesic Panagesic   Buffinol plain or Extra Strength Florinal or Florinal with Codeine Panwarfarin   Buf-Tabs Flurbiprofen Penicillamine   Butalbital Compound Four-way cold tablets Penicillin   Butazolidin Fragmin Pepto-Bismol   Carbenicillin Geminisyn Percodan   Carna Arthritis Reliever Geopen Persantine   Carprofen Gold's salt Persistin   Chloramphenicol Goody's Phenylbutazone   Chloromycetin  [place] Piroxlcam   Clmetidine heparin Plaquenil   Cllnoril Hyco-pap Ponstel   Clofibrate Hydroxy chloroquine Propoxyphen         Before stopping any of these medications, be sure to consult the physician who ordered them.  Some, such as Coumadin (Warfarin) are ordered to prevent or treat serious conditions such as "deep thrombosis", "pumonary embolisms", and other heart problems.  The amount of time that you may need off of the medication may also vary with the medication and the  reason for which you were taking it.  If you are taking any of these medications, please make sure you notify your pain physician before you undergo any procedures.

## 2015-08-12 NOTE — Progress Notes (Signed)
   Subjective:    Patient ID: Yvonne Singleton, female    DOB: 13-Jun-1935, 80 y.o.   MRN: [id number]  HPI Patient 80 year old female returns to pain management for further evaluation and treatment of pain involving the lower back and lower extremity region. Patient states that she is concern regarding the weakness of the right lower extremity which is aggravated by standing and walking and associated with significant pain with standing and walking. Patient is status post total hip replacements. Is been dislocation of the total hips. At the present time patient's predominant pain appears to be due to intraspinal abnormalities of the lumbar region. We discussed performing lumbar epidural steroid injection and will consider patient for such at time return appointment pending medical clearance of patient. The patient was with understanding and in agreement status treatment plan.      Review of Systems     Objective:   Physical Exam there was tenderness over the splenius capitis and occipitalis regions of mild degree with mild tenderness of the acromioclavicular and glenohumeral joint regions. There appeared to be unremarkable Spurling's maneuver and patient was with decreased grip strength. There were well-healed scars of the upper extremities noted. Patient was with tremor of the upper extremities as well. Tinel and Phalen's maneuver were performed and reproduced moderate discomfort. Patient was with significantly decreased grip strength. There was tennis over the acromioclavicular and glenohumeral joint regions of moderate degree with limited range of motion of the shoulder noted there was tenderness over the region of the thoracic facet thoracic paraspinal muscles without crepitus of the thoracic region noted. Palpation over the lumbar paraspinal musculature region lumbar facet region was with moderately severe discomfort. Lateral bending rotation and extension and palpation of the lumbar facets  reproduce moderately severe discomfort. There was decreased straight leg raising tolerates approximately 20 with decreased EHL strength without definite sensory deficit of dermatomal distribution detected. There was mild to moderate tenderness along the greater trochanteric region and iliotibial band region. Palpation of the gluteal and piriformis musculature regions reproduced moderate discomfort. There was negative clonus negative Homans. Abdomen was nontender with no costovertebral angle tenderness noted.      Assessment & Plan:    Degenerative changes lumbar spine L2-3, L3-4 degenerative changes with postoperative changes noted bilaterally, multilevel degenerative changes noted throughout the lumbar spine with neural foraminal narrowing and disc herniations as well  Lumbar stenosis with neurogenic claudication  Lumbar facet syndrome  Sacroiliac joint dysfunction  Status post total hip replacement  DJD of shoulder  Status post upper extremity surgery    PLAN     Continue present medication Effexor Neurontin and oxycodone  Lumbar epidural steroid injection to be performed at time return appointment  F/U PCP Dr Wolfgang Phoenix  for evaliation of  BP and general medical  condition  F/U surgical evaluation with Dr.Hooten as planned . Patient without desire to consider neurosurgical evaluation of lumbar and lower extremity pain and paresthesias  F/U neurological evaluation. May consider pending follow-up evaluations  May consider radiofrequency rhizolysis or intraspinal procedures pending response to present treatment and F/U evaluation   Patient to call Pain Management Center should patient have concerns prior to scheduled return appointment.

## 2015-08-12 NOTE — Progress Notes (Signed)
Safety precautions to be maintained throughout the outpatient stay will include: orient to surroundings, keep bed in low position, maintain call bell within reach at all times, provide assistance with transfer out of bed and ambulation.  

## 2015-08-18 ENCOUNTER — Encounter: Payer: Self-pay | Admitting: Pain Medicine

## 2015-08-18 ENCOUNTER — Ambulatory Visit: Payer: Medicare Other | Attending: Pain Medicine | Admitting: Pain Medicine

## 2015-08-18 VITALS — BP 114/62 | HR 58 | Temp 98.4°F | Resp 14 | Ht 64.0 in | Wt 182.0 lb

## 2015-08-18 DIAGNOSIS — M533 Sacrococcygeal disorders, not elsewhere classified: Secondary | ICD-10-CM

## 2015-08-18 DIAGNOSIS — Z96643 Presence of artificial hip joint, bilateral: Secondary | ICD-10-CM

## 2015-08-18 DIAGNOSIS — M79606 Pain in leg, unspecified: Secondary | ICD-10-CM | POA: Diagnosis not present

## 2015-08-18 DIAGNOSIS — M545 Low back pain: Secondary | ICD-10-CM | POA: Insufficient documentation

## 2015-08-18 DIAGNOSIS — M961 Postlaminectomy syndrome, not elsewhere classified: Secondary | ICD-10-CM

## 2015-08-18 DIAGNOSIS — M5136 Other intervertebral disc degeneration, lumbar region: Secondary | ICD-10-CM | POA: Insufficient documentation

## 2015-08-18 DIAGNOSIS — M19011 Primary osteoarthritis, right shoulder: Secondary | ICD-10-CM

## 2015-08-18 DIAGNOSIS — M48062 Spinal stenosis, lumbar region with neurogenic claudication: Secondary | ICD-10-CM

## 2015-08-18 DIAGNOSIS — M5030 Other cervical disc degeneration, unspecified cervical region: Secondary | ICD-10-CM

## 2015-08-18 DIAGNOSIS — M19012 Primary osteoarthritis, left shoulder: Secondary | ICD-10-CM

## 2015-08-18 MED ORDER — TRIAMCINOLONE ACETONIDE 40 MG/ML IJ SUSP
INTRAMUSCULAR | Status: AC
  Administered 2015-08-18: 40 mg
  Filled 2015-08-18: qty 1

## 2015-08-18 MED ORDER — TRIAMCINOLONE ACETONIDE 40 MG/ML IJ SUSP: 40.0000 mg | Freq: Once | INTRAMUSCULAR | Status: DC

## 2015-08-18 MED ORDER — CEFAZOLIN SODIUM 1-5 GM-% IV SOLN: 1.0000 g | Freq: Once | INTRAVENOUS | Status: DC

## 2015-08-18 MED ORDER — ORPHENADRINE CITRATE 30 MG/ML IJ SOLN
INTRAMUSCULAR | Status: AC
  Administered 2015-08-18: 60 mg via INTRAMUSCULAR
  Filled 2015-08-18: qty 2

## 2015-08-18 MED ORDER — LACTATED RINGERS IV SOLN: 1000.0000 mL | INTRAVENOUS | Status: DC

## 2015-08-18 MED ORDER — CEFUROXIME AXETIL 250 MG PO TABS: 250.0000 mg | ORAL_TABLET | Freq: Two times a day (BID) | ORAL | Status: DC

## 2015-08-18 MED ORDER — MIDAZOLAM HCL 5 MG/5ML IJ SOLN
INTRAMUSCULAR | Status: AC
Start: 2015-08-18 — End: 2015-08-18
  Administered 2015-08-18: 2 mg via INTRAVENOUS
  Filled 2015-08-18: qty 5

## 2015-08-18 MED ORDER — FENTANYL CITRATE (PF) 100 MCG/2ML IJ SOLN
INTRAMUSCULAR | Status: AC
  Administered 2015-08-18: 50 ug via INTRAVENOUS
  Filled 2015-08-18: qty 2

## 2015-08-18 MED ORDER — CEFAZOLIN SODIUM 1 G IJ SOLR
INTRAMUSCULAR | Status: AC
  Administered 2015-08-18: 1 g via INTRAVENOUS
  Filled 2015-08-18: qty 10

## 2015-08-18 MED ORDER — MIDAZOLAM HCL 5 MG/5ML IJ SOLN
5.0000 mg | Freq: Once | INTRAMUSCULAR | Status: AC
Start: 2015-08-18 — End: 2015-08-18
  Administered 2015-08-18: 2 mg via INTRAVENOUS

## 2015-08-18 MED ORDER — ORPHENADRINE CITRATE 30 MG/ML IJ SOLN
60.0000 mg | Freq: Once | INTRAMUSCULAR | Status: AC
  Administered 2015-08-18: 60 mg via INTRAMUSCULAR

## 2015-08-18 MED ORDER — SODIUM CHLORIDE 0.9 % IJ SOLN
INTRAMUSCULAR | Status: AC
  Administered 2015-08-18: 4 mL
  Filled 2015-08-18: qty 20

## 2015-08-18 MED ORDER — BUPIVACAINE HCL (PF) 0.25 % IJ SOLN
30.0000 mL | Freq: Once | INTRAMUSCULAR | Status: AC
  Administered 2015-08-18: 20 mL

## 2015-08-18 MED ORDER — LIDOCAINE HCL (PF) 1 % IJ SOLN
INTRAMUSCULAR | Status: AC
  Administered 2015-08-18: 5 mL via SUBCUTANEOUS
  Filled 2015-08-18: qty 5

## 2015-08-18 MED ORDER — LIDOCAINE HCL (PF) 1 % IJ SOLN
10.0000 mL | Freq: Once | INTRAMUSCULAR | Status: AC
  Administered 2015-08-18: 5 mL via SUBCUTANEOUS

## 2015-08-18 MED ORDER — BUPIVACAINE HCL (PF) 0.25 % IJ SOLN
INTRAMUSCULAR | Status: AC
  Administered 2015-08-18: 20 mL
  Filled 2015-08-18: qty 30

## 2015-08-18 MED ORDER — FENTANYL CITRATE (PF) 100 MCG/2ML IJ SOLN
100.0000 ug | Freq: Once | INTRAMUSCULAR | Status: AC
  Administered 2015-08-18: 50 ug via INTRAVENOUS

## 2015-08-18 NOTE — Progress Notes (Signed)
   Subjective:    Patient ID: Yvonne Singleton, female    DOB: 21-Aug-1935, 80 y.o.   MRN: [id number]  HPI PROCEDURE PERFORMED: Lumbar epidural steroid injection   NOTE: The patient is a 80 y.o. female who returns to [place] for further evaluation and treatment of pain involving the lumbar and lower extremity region. mri revealed the patient to be with Degenerative changes lumbar spine L2-3, L3-4 degenerative changes with postoperative changes noted bilaterally, multilevel degenerative changes noted throughout the lumbar spine with neural foraminal narrowing and disc herniations as well. The risks, benefits, and expectations of the procedure have been discussed and explained to the patient who was understanding and in agreement with suggested treatment plan. We will proceed with lumbar epidural steroid injection as discussed and as explained to the patient who is willing to proceed with procedure as planned.   DESCRIPTION OF PROCEDURE: Lumbar epidural steroid injection with IV Versed, IV fentanyl conscious sedation, EKG, blood pressure, pulse, and pulse oximetry monitoring. The procedure was performed with the patient in the prone position under fluoroscopic guidance. A local anesthetic skin wheal of 1.5% plain lidocaine was accomplished at proposed entry site. An 18-gauge Tuohy epidural needle was inserted at the L 5 vertebral body level left of the midline via loss-of-resistance technique with negative heme and negative CSF return. A total of 4 mL of Preservative-Free normal saline with 40 mg of Kenalog injected incrementally via epidurally placed needle. Needle was removed.    A total of 40 mg of Kenalog was utilized for the procedure.   The patient tolerated the injection well.    PLAN:   1. Medications: We will continue presently prescribed medications. 2. Will consider modification of treatment regimen pending response to treatment rendered on today's visit and  follow-up evaluation. 3. The patient is to follow-up with primary care physician Dr Wolfgang Phoenix regarding blood pressure and general medical condition status post lumbar epidural steroid injection performed on today's visit. 4. Surgical evaluation. 5. Neurological evaluation. 6. The patient may be a candidate for radiofrequency procedures, implantation device, and other treatment pending response to treatment and follow-up evaluation. 7. The patient has been advised to adhere to proper body mechanics and avoid activities which appear to aggravate condition. 8. The patient has been advised to call the Pain Management Center prior to scheduled return appointment should there be significant change in condition or should there be sign  The patient is understanding and agrees with the suggested  treatment plan   Review of Systems     Objective:   Physical Exam        Assessment & Plan:

## 2015-08-18 NOTE — Progress Notes (Signed)
Safety precautions to be maintained throughout the outpatient stay will include: orient to surroundings, keep bed in low position, maintain call bell within reach at all times, provide assistance with transfer out of bed and ambulation.  

## 2015-08-18 NOTE — Patient Instructions (Addendum)
PLAN   Continue present medication Effexor Neurontin and oxycodone Please obtain Ceftin antibiotic and begin taking Ceftin antibiotic today as prescribed  F/U PCP Dr.N Humphrey Rolls  for evaliation of  BP and general medical condition as discussed  F/U surgical evaluation. Follow-up Dr. Marry Guan as planned  F/U Dr. Margaretmary Eddy as discussed  F/U Dr. Merita Norton as discussed  F/U neurological evaluation. Follow-up with Dr. Manuella Ghazi as discussed  May consider radiofrequency procedures and other treatment pending response to treatment and follow-up evaluation  Patient is to call pain management prior to scheduled appointment should patient have concerns regarding condition  Pain Management Discharge Instructions  General Discharge Instructions :  If you need to reach your doctor call: Monday-Friday 8:00 am - 4:00 pm at [phone] or toll free [phone].  After clinic hours [phone] to have operator reach doctor.  Bring all of your medication bottles to all your appointments in the pain clinic.  To cancel or reschedule your appointment with Pain Management please remember to call 24 hours in advance to avoid a fee.  Refer to the educational materials which you have been given on: General Risks, I had my Procedure. Discharge Instructions, Post Sedation.  Post Procedure Instructions:  The drugs you were given will stay in your system until tomorrow, so for the next 24 hours you should not drive, make any legal decisions or drink any alcoholic beverages.  You may eat anything you prefer, but it is better to start with liquids then soups and crackers, and gradually work up to solid foods.  Please notify your doctor immediately if you have any unusual bleeding, trouble breathing or pain that is not related to your normal pain.  Depending on the type of procedure that was done, some parts of your body may feel week and/or numb.  This usually clears up by tonight or the next day.  Walk with  the use of an assistive device or accompanied by an adult for the 24 hours.  You may use ice on the affected area for the first 24 hours.  Put ice in a Ziploc bag and cover with a towel and place against area 15 minutes on 15 minutes off.  You may switch to heat after 24 hours.  A prescription for CEFTIN was sent to your pharmacy and should be available for pickup today.

## 2015-08-19 ENCOUNTER — Telehealth: Payer: Self-pay | Admitting: *Deleted

## 2015-08-19 NOTE — Telephone Encounter (Signed)
Patient verbalizes no complications from procedure.  

## 2015-09-08 ENCOUNTER — Ambulatory Visit: Payer: Medicare Other | Admitting: Pain Medicine

## 2015-09-08 ENCOUNTER — Other Ambulatory Visit: Payer: Self-pay | Admitting: Pain Medicine

## 2015-09-08 ENCOUNTER — Telehealth: Payer: Self-pay | Admitting: Pain Medicine

## 2015-09-08 NOTE — Telephone Encounter (Signed)
Has been in bed sick with flu / and forgot her appt / she will call to resched when she feels better

## 2015-09-10 ENCOUNTER — Ambulatory Visit: Payer: Medicare Other | Admitting: Pain Medicine

## 2015-09-19 ENCOUNTER — Other Ambulatory Visit: Payer: Self-pay | Admitting: Internal Medicine

## 2015-09-19 DIAGNOSIS — Z1231 Encounter for screening mammogram for malignant neoplasm of breast: Secondary | ICD-10-CM

## 2015-09-23 ENCOUNTER — Encounter: Payer: Self-pay | Admitting: Pain Medicine

## 2015-09-23 ENCOUNTER — Ambulatory Visit: Payer: Medicare Other | Attending: Pain Medicine | Admitting: Pain Medicine

## 2015-09-23 VITALS — BP 141/64 | HR 63 | Temp 98.3°F | Resp 16 | Ht 63.0 in | Wt 184.0 lb

## 2015-09-23 DIAGNOSIS — M545 Low back pain: Secondary | ICD-10-CM | POA: Diagnosis present

## 2015-09-23 DIAGNOSIS — M79604 Pain in right leg: Secondary | ICD-10-CM | POA: Diagnosis present

## 2015-09-23 DIAGNOSIS — Z96649 Presence of unspecified artificial hip joint: Secondary | ICD-10-CM | POA: Diagnosis not present

## 2015-09-23 DIAGNOSIS — M533 Sacrococcygeal disorders, not elsewhere classified: Secondary | ICD-10-CM | POA: Insufficient documentation

## 2015-09-23 DIAGNOSIS — M4806 Spinal stenosis, lumbar region: Secondary | ICD-10-CM | POA: Insufficient documentation

## 2015-09-23 DIAGNOSIS — M47816 Spondylosis without myelopathy or radiculopathy, lumbar region: Secondary | ICD-10-CM | POA: Diagnosis not present

## 2015-09-23 DIAGNOSIS — M19019 Primary osteoarthritis, unspecified shoulder: Secondary | ICD-10-CM | POA: Diagnosis not present

## 2015-09-23 DIAGNOSIS — M19012 Primary osteoarthritis, left shoulder: Secondary | ICD-10-CM

## 2015-09-23 DIAGNOSIS — M79605 Pain in left leg: Secondary | ICD-10-CM | POA: Diagnosis present

## 2015-09-23 DIAGNOSIS — M5126 Other intervertebral disc displacement, lumbar region: Secondary | ICD-10-CM | POA: Diagnosis not present

## 2015-09-23 DIAGNOSIS — M961 Postlaminectomy syndrome, not elsewhere classified: Secondary | ICD-10-CM

## 2015-09-23 DIAGNOSIS — M5030 Other cervical disc degeneration, unspecified cervical region: Secondary | ICD-10-CM

## 2015-09-23 DIAGNOSIS — Z96643 Presence of artificial hip joint, bilateral: Secondary | ICD-10-CM

## 2015-09-23 DIAGNOSIS — M5136 Other intervertebral disc degeneration, lumbar region: Secondary | ICD-10-CM

## 2015-09-23 DIAGNOSIS — M19011 Primary osteoarthritis, right shoulder: Secondary | ICD-10-CM

## 2015-09-23 DIAGNOSIS — M48062 Spinal stenosis, lumbar region with neurogenic claudication: Secondary | ICD-10-CM

## 2015-09-23 MED ORDER — OXYCODONE HCL 5 MG PO TABS: ORAL_TABLET | ORAL | Status: DC

## 2015-09-23 MED ORDER — GABAPENTIN 100 MG PO CAPS: 100.0000 mg | ORAL_CAPSULE | Freq: Every day | ORAL | Status: DC

## 2015-09-23 NOTE — Progress Notes (Signed)
Safety precautions to be maintained throughout the outpatient stay will include: orient to surroundings, keep bed in low position, maintain call bell within reach at all times, provide assistance with transfer out of bed and ambulation.  

## 2015-09-23 NOTE — Patient Instructions (Addendum)
   PLAN   Continue present medication Neurontin and oxycodone  Continue Cymbalta as prescribed and take vitamin B complex as discussed  F/U PCP Dr.N Humphrey Rolls  for evaliation of  BP and general medical condition as discussed  F/U surgical evaluation. Follow-up Dr. Marry Guan as planned  Continue physical therapy at this time  F/U Dr. Margaretmary Eddy as discussed  F/U Dr. Merita Norton as discussed  F/U neurological evaluation. Follow-up with Dr. Manuella Ghazi as discussed  May consider radiofrequency rhizolysis or intraspinal procedures pending response to present treatment and F/U evaluation   Patient to call Pain Management Center should patient have concerns prior to scheduled return appointment

## 2015-09-23 NOTE — Progress Notes (Signed)
   Subjective:    Patient ID: Yvonne Singleton, female    DOB: 1935/02/27, 80 y.o.   MRN: [id number]  HPI  The patient is a 80 year old female who returns to pain management Center for further evaluation and treatment of pain involving the lower back and lower extremity region predominantly with pain occurring in the mid back region as well as shoulders and upper extremity regions of lesser degree. At the present time patient states that she is continuing exercise program and her Effexor has been changed to Cymbalta. We will observe response to exercising and Cymbalta and remain available to consider patient for additional modifications of treatment regimen pending response to the present plan. The patient agrees to suggested treatment regimen. Patient denies any trauma change in events of daily living call significant change in symptomatology. The patient has had dislocation of her prosthetic hip which is occurred on different occasions. Patient has been cautioned regarding body mechanics and we'll continue exercising as discussed and we will remain available to consider modification of treatment pending response to treatment and follow-up evaluation. All agreed to suggested treatment plan      Review of Systems     Objective:   Physical Exam There was tenderness of the splenius capitis and occipitalis region palpation which reproduces pain of minimal degree. There was minimal tenderness over the cervical facet cervical paraspinal musculature region. There was moderate tenderness to palpation of the acromioclavicular and glenohumeral joint region with limited range of motion of the shoulder. Patient appeared to be with decreased grip strength and Tinel and Phalen's maneuver were without increased pain of moderate degree with well-healed surgical scars of the upper extremities noted. Grip strength was decreased on reevaluation. Palpation over the thoracic facet thoracic paraspinal musculature  region was attends to palpation of moderate degree on the right with moderate muscle spasms noted without crepitus of the thoracic region noted. Palpation over the lumbar paraspinal must reason lumbar facet region was attends to palpation of moderate degree there was moderate tenderness to palpation over the PSIS and PII S region as well as the gluteal and piriformis musculature region with mild to moderate tenderness of the greater trochanteric region iliotibial band region. No definite sensory deficit dermatomal dystrophy detected. Negative clonus negative Homans. Knees were tenderness to palpation with EHL strength appeared to be decreased. Negative clonus negative Homans. Abdomen nontender with no costovertebral tenderness noted.       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, L3-4 degenerative changes with postoperative changes noted bilaterally, multilevel degenerative changes noted throughout the lumbar spine with neural foraminal narrowing and disc herniations as well  Lumbar stenosis with neurogenic claudication  Lumbar facet syndrome  Sacroiliac joint dysfunction  Status post total hip replacement  DJD of shoulder     PLAN     Continue present medication Neurontin and oxycodone  Continue Cymbalta as prescribed and take vitamin B complex as discussed  F/U PCP Dr.N Humphrey Rolls  for evaliation of  BP and general medical condition as discussed  F/U surgical evaluation. Follow-up Dr. Marry Guan as planned  Continue physical therapy at this time  F/U Dr. Margaretmary Eddy as discussed  F/U Dr. Merita Norton as discussed  F/U neurological evaluation. Follow-up with Dr. Manuella Ghazi as discussed  May consider radiofrequency rhizolysis or intraspinal procedures pending response to present treatment and F/U evaluation   Patient to call Pain Management Center should patient have concerns prior to scheduled return appointment

## 2015-10-02 ENCOUNTER — Other Ambulatory Visit: Payer: Self-pay | Admitting: Internal Medicine

## 2015-10-02 ENCOUNTER — Ambulatory Visit
Admission: RE | Admit: 2015-10-02 | Discharge: 2015-10-02 | Disposition: A | Discharge status: Home / Self Care | Payer: Medicare Other | Source: Ambulatory Visit | Attending: Internal Medicine | Admitting: Internal Medicine

## 2015-10-02 DIAGNOSIS — Z1231 Encounter for screening mammogram for malignant neoplasm of breast: Secondary | ICD-10-CM | POA: Insufficient documentation

## 2015-10-02 HISTORY — DX: Malignant neoplasm of unspecified site of unspecified female breast: C50.919

## 2015-10-23 ENCOUNTER — Encounter: Payer: Self-pay | Admitting: Pain Medicine

## 2015-10-23 ENCOUNTER — Ambulatory Visit: Payer: Medicare Other | Attending: Pain Medicine | Admitting: Pain Medicine

## 2015-10-23 VITALS — BP 119/58 | HR 57 | Temp 97.5°F | Resp 18 | Ht 64.0 in | Wt 186.0 lb

## 2015-10-23 DIAGNOSIS — M7060 Trochanteric bursitis, unspecified hip: Secondary | ICD-10-CM | POA: Insufficient documentation

## 2015-10-23 DIAGNOSIS — M19012 Primary osteoarthritis, left shoulder: Secondary | ICD-10-CM | POA: Insufficient documentation

## 2015-10-23 DIAGNOSIS — M961 Postlaminectomy syndrome, not elsewhere classified: Secondary | ICD-10-CM

## 2015-10-23 DIAGNOSIS — M79606 Pain in leg, unspecified: Secondary | ICD-10-CM | POA: Diagnosis present

## 2015-10-23 DIAGNOSIS — M5030 Other cervical disc degeneration, unspecified cervical region: Secondary | ICD-10-CM

## 2015-10-23 DIAGNOSIS — M47816 Spondylosis without myelopathy or radiculopathy, lumbar region: Secondary | ICD-10-CM | POA: Diagnosis not present

## 2015-10-23 DIAGNOSIS — Z96649 Presence of unspecified artificial hip joint: Secondary | ICD-10-CM | POA: Insufficient documentation

## 2015-10-23 DIAGNOSIS — M5126 Other intervertebral disc displacement, lumbar region: Secondary | ICD-10-CM | POA: Insufficient documentation

## 2015-10-23 DIAGNOSIS — Z96643 Presence of artificial hip joint, bilateral: Secondary | ICD-10-CM

## 2015-10-23 DIAGNOSIS — M545 Low back pain: Secondary | ICD-10-CM | POA: Diagnosis present

## 2015-10-23 DIAGNOSIS — M533 Sacrococcygeal disorders, not elsewhere classified: Secondary | ICD-10-CM | POA: Diagnosis not present

## 2015-10-23 DIAGNOSIS — M19011 Primary osteoarthritis, right shoulder: Secondary | ICD-10-CM | POA: Insufficient documentation

## 2015-10-23 DIAGNOSIS — M7061 Trochanteric bursitis, right hip: Secondary | ICD-10-CM

## 2015-10-23 DIAGNOSIS — M4806 Spinal stenosis, lumbar region: Secondary | ICD-10-CM | POA: Insufficient documentation

## 2015-10-23 DIAGNOSIS — M48062 Spinal stenosis, lumbar region with neurogenic claudication: Secondary | ICD-10-CM

## 2015-10-23 DIAGNOSIS — M5136 Other intervertebral disc degeneration, lumbar region: Secondary | ICD-10-CM

## 2015-10-23 MED ORDER — OXYCODONE HCL 5 MG PO TABS: ORAL_TABLET | ORAL | Status: DC

## 2015-10-23 NOTE — Progress Notes (Signed)
Safety precautions to be maintained throughout the outpatient stay will include: orient to surroundings, keep bed in low position, maintain call bell within reach at all times, provide assistance with transfer out of bed and ambulation.  

## 2015-10-23 NOTE — Patient Instructions (Addendum)
PLAN   Continue present medication Neurontin and oxycodone  Continue Cymbalta as prescribed and take vitamin B complex as discussed  Greater trochanteric bursa injection to be performed at time of return appointment  F/U PCP Dr.N Humphrey Rolls  for evaliation of  BP and general medical condition as discussed  F/U surgical evaluation. Follow-up Dr. Marry Guan as planned  F/U Dr. Margaretmary Eddy as discussed  F/U Dr. Merita Norton as discussed  F/U neurological evaluation. Follow-up with Dr. Manuella Ghazi as discussed  May consider radiofrequency rhizolysis or intraspinal procedures pending response to present treatment and F/U evaluation   Patient to call Pain Management Center should patient have concerns prior to scheduled return appointmentPain Management Discharge Instructions  General Discharge Instructions :  If you need to reach your doctor call: Monday-Friday 8:00 am - 4:00 pm at [phone] or toll free [phone].  After clinic hours [phone] to have operator reach doctor.  Bring all of your medication bottles to all your appointments in the pain clinic.  To cancel or reschedule your appointment with Pain Management please remember to call 24 hours in advance to avoid a fee.  Refer to the educational materials which you have been given on: General Risks, I had my Procedure. Discharge Instructions, Post Sedation.  Post Procedure Instructions:  The drugs you were given will stay in your system until tomorrow, so for the next 24 hours you should not drive, make any legal decisions or drink any alcoholic beverages.  You may eat anything you prefer, but it is better to start with liquids then soups and crackers, and gradually work up to solid foods.  Please notify your doctor immediately if you have any unusual bleeding, trouble breathing or pain that is not related to your normal pain.  Depending on the type of procedure that was done, some parts of your body may feel week and/or numb.   This usually clears up by tonight or the next day.  Walk with the use of an assistive device or accompanied by an adult for the 24 hours.  You may use ice on the affected area for the first 24 hours.  Put ice in a Ziploc bag and cover with a towel and place against area 15 minutes on 15 minutes off.  You may switch to heat after 24 hours.GENERAL RISKS AND COMPLICATIONS  What are the risk, side effects and possible complications? Generally speaking, most procedures are safe.  However, with any procedure there are risks, side effects, and the possibility of complications.  The risks and complications are dependent upon the sites that are lesioned, or the type of nerve block to be performed.  The closer the procedure is to the spine, the more serious the risks are.  Great care is taken when placing the radio frequency needles, block needles or lesioning probes, but sometimes complications can occur. 1. Infection: Any time there is an injection through the skin, there is a risk of infection.  This is why sterile conditions are used for these blocks.  There are four possible types of infection. 1. Localized skin infection. 2. Central Nervous System Infection-This can be in the form of Meningitis, which can be deadly. 3. Epidural Infections-This can be in the form of an epidural abscess, which can cause pressure inside of the spine, causing compression of the spinal cord with subsequent paralysis. This would require an emergency surgery to decompress, and there are no guarantees that the patient would recover from the paralysis. 4. Discitis-This is an infection  of the intervertebral discs.  It occurs in about 1% of discography procedures.  It is difficult to treat and it may lead to surgery.        2. Pain: the needles have to go through skin and soft tissues, will cause soreness.       3. Damage to internal structures:  The nerves to be lesioned may be near blood vessels or    other nerves which can be  potentially damaged.       4. Bleeding: Bleeding is more common if the patient is taking blood thinners such as  aspirin, Coumadin, Ticiid, Plavix, etc., or if he/she have some genetic predisposition  such as hemophilia. Bleeding into the spinal canal can cause compression of the spinal  cord with subsequent paralysis.  This would require an emergency surgery to  decompress and there are no guarantees that the patient would recover from the  paralysis.       5. Pneumothorax:  Puncturing of a lung is a possibility, every time a needle is introduced in  the area of the chest or upper back.  Pneumothorax refers to free air around the  collapsed lung(s), inside of the thoracic cavity (chest cavity).  Another two possible  complications related to a similar event would include: Hemothorax and Chylothorax.   These are variations of the Pneumothorax, where instead of air around the collapsed  lung(s), you may have blood or chyle, respectively.       6. Spinal headaches: They may occur with any procedures in the area of the spine.       7. Persistent CSF (Cerebro-Spinal Fluid) leakage: This is a rare problem, but may occur  with prolonged intrathecal or epidural catheters either due to the formation of a fistulous  track or a dural tear.       8. Nerve damage: By working so close to the spinal cord, there is always a possibility of  nerve damage, which could be as serious as a permanent spinal cord injury with  paralysis.       9. Death:  Although rare, severe deadly allergic reactions known as "Anaphylactic  reaction" can occur to any of the medications used.      10. Worsening of the symptoms:  We can always make thing worse.  What are the chances of something like this happening? Chances of any of this occuring are extremely low.  By statistics, you have more of a chance of getting killed in a motor vehicle accident: while driving to the hospital than any of the above occurring .  Nevertheless, you should be  aware that they are possibilities.  In general, it is similar to taking a shower.  Everybody knows that you can slip, hit your head and get killed.  Does that mean that you should not shower again?  Nevertheless always keep in mind that statistics do not mean anything if you happen to be on the wrong side of them.  Even if a procedure has a 1 (one) in a 1,000,000 (million) chance of going wrong, it you happen to be that one..Also, keep in mind that by statistics, you have more of a chance of having something go wrong when taking medications.  Who should not have this procedure? If you are on a blood thinning medication (e.g. Coumadin, Plavix, see list of "Blood Thinners"), or if you have an active infection going on, you should not have the procedure.  If you are taking any blood  thinners, please inform your physician.  How should I prepare for this procedure?  Do not eat or drink anything at least six hours prior to the procedure.  Bring a driver with you .  It cannot be a taxi.  Come accompanied by an adult that can drive you back, and that is strong enough to help you if your legs get weak or numb from the local anesthetic.  Take all of your medicines the morning of the procedure with just enough water to swallow them.  If you have diabetes, make sure that you are scheduled to have your procedure done first thing in the morning, whenever possible.  If you have diabetes, take only half of your insulin dose and notify our nurse that you have done so as soon as you arrive at the clinic.  If you are diabetic, but only take blood sugar pills (oral hypoglycemic), then do not take them on the morning of your procedure.  You may take them after you have had the procedure.  Do not take aspirin or any aspirin-containing medications, at least eleven (11) days prior to the procedure.  They may prolong bleeding.  Wear loose fitting clothing that may be easy to take off and that you would not mind if it  got stained with Betadine or blood.  Do not wear any jewelry or perfume  Remove any nail coloring.  It will interfere with some of our monitoring equipment.  NOTE: Remember that this is not meant to be interpreted as a complete list of all possible complications.  Unforeseen problems may occur.  BLOOD THINNERS The following drugs contain aspirin or other products, which can cause increased bleeding during surgery and should not be taken for 2 weeks prior to and 1 week after surgery.  If you should need take something for relief of minor pain, you may take acetaminophen which is found in Tylenol,m Datril, Anacin-3 and Panadol. It is not blood thinner. The products listed below are.  Do not take any of the products listed below in addition to any listed on your instruction sheet.  A.P.C or A.P.C with Codeine Codeine Phosphate Capsules #3 Ibuprofen Ridaura  ABC compound Congesprin Imuran rimadil  Advil Cope Indocin Robaxisal  Alka-Seltzer Effervescent Pain Reliever and Antacid Coricidin or Coricidin-D  Indomethacin Rufen  Alka-Seltzer plus Cold Medicine Cosprin Ketoprofen S-A-C Tablets  Anacin Analgesic Tablets or Capsules Coumadin Korlgesic Salflex  Anacin Extra Strength Analgesic tablets or capsules CP-2 Tablets Lanoril Salicylate  Anaprox Cuprimine Capsules Levenox Salocol  Anexsia-D Dalteparin Magan Salsalate  Anodynos Darvon compound Magnesium Salicylate Sine-off  Ansaid Dasin Capsules Magsal Sodium Salicylate  Anturane Depen Capsules Marnal Soma  APF Arthritis pain formula Dewitt's Pills Measurin Stanback  Argesic Dia-Gesic Meclofenamic Sulfinpyrazone  Arthritis Bayer Timed Release Aspirin Diclofenac Meclomen Sulindac  Arthritis pain formula Anacin Dicumarol Medipren Supac  Analgesic (Safety coated) Arthralgen Diffunasal Mefanamic Suprofen  Arthritis Strength Bufferin Dihydrocodeine Mepro Compound Suprol  Arthropan liquid Dopirydamole Methcarbomol with Aspirin Synalgos  ASA  tablets/Enseals Disalcid Micrainin Tagament  Ascriptin Doan's Midol Talwin  Ascriptin A/D Dolene Mobidin Tanderil  Ascriptin Extra Strength Dolobid Moblgesic Ticlid  Ascriptin with Codeine Doloprin or Doloprin with Codeine Momentum Tolectin  Asperbuf Duoprin Mono-gesic Trendar  Aspergum Duradyne Motrin or Motrin IB Triminicin  Aspirin plain, buffered or enteric coated Durasal Myochrisine Trigesic  Aspirin Suppositories Easprin Nalfon Trillsate  Aspirin with Codeine Ecotrin Regular or Extra Strength Naprosyn Uracel  Atromid-S Efficin Naproxen Ursinus  Auranofin Capsules Elmiron Neocylate Vanquish  Axotal Emagrin Norgesic Verin  Azathioprine Empirin or Empirin with Codeine Normiflo Vitamin E  Azolid Emprazil Nuprin Voltaren  Bayer Aspirin plain, buffered or children's or timed BC Tablets or powders Encaprin Orgaran Warfarin Sodium  Buff-a-Comp Enoxaparin Orudis Zorpin  Buff-a-Comp with Codeine Equegesic Os-Cal-Gesic   Buffaprin Excedrin plain, buffered or Extra Strength Oxalid   Bufferin Arthritis Strength Feldene Oxphenbutazone   Bufferin plain or Extra Strength Feldene Capsules Oxycodone with Aspirin   Bufferin with Codeine Fenoprofen Fenoprofen Pabalate or Pabalate-SF   Buffets II Flogesic Panagesic   Buffinol plain or Extra Strength Florinal or Florinal with Codeine Panwarfarin   Buf-Tabs Flurbiprofen Penicillamine   Butalbital Compound Four-way cold tablets Penicillin   Butazolidin Fragmin Pepto-Bismol   Carbenicillin Geminisyn Percodan   Carna Arthritis Reliever Geopen Persantine   Carprofen Gold's salt Persistin   Chloramphenicol Goody's Phenylbutazone   Chloromycetin Haltrain Piroxlcam   Clmetidine heparin Plaquenil   Cllnoril Hyco-pap Ponstel   Clofibrate Hydroxy chloroquine Propoxyphen         Before stopping any of these medications, be sure to consult the physician who ordered them.  Some, such as Coumadin (Warfarin) are ordered to prevent or treat serious conditions  such as "deep thrombosis", "pumonary embolisms", and other heart problems.  The amount of time that you may need off of the medication may also vary with the medication and the reason for which you were taking it.  If you are taking any of these medications, please make sure you notify your pain physician before you undergo any procedures.         Trigger Point Injection Trigger points are areas where you have muscle pain. A trigger point injection is a shot given in the trigger point to relieve that pain. A trigger point might feel like a knot in your muscle. It hurts to press on a trigger point. Sometimes the pain spreads out (radiates) to other parts of the body. For example, pressing on a trigger point in your shoulder might cause pain in your arm or neck. You might have one trigger point. Or, you might have more than one. People often have trigger points in their upper back and lower back. They also occur often in the neck and shoulders. Pain from a trigger point lasts for a long time. It can make it hard to keep moving. You might not be able to do the exercise or physical therapy that could help you deal with the pain. A trigger point injection may help. It does not work for everyone. But, it may relieve your pain for a few days or a few months. A trigger point injection does not cure long-lasting (chronic) pain. LET YOUR CAREGIVER KNOW ABOUT: 2. Any allergies (especially to latex, lidocaine, or steroids). 3. Blood-thinning medicines that you take. These drugs can lead to bleeding or bruising after an injection. They include: 1. Aspirin. 2. Ibuprofen. 3. Clopidogrel. 4. Warfarin. 4. Other medicines you take. This includes all vitamins, herbs, eyedrops, over-the-counter medicines, and creams. 5. Use of steroids. 6. Recent infections. 7. Past problems with numbing medicines. 8. Bleeding problems. 9. Surgeries you have had. 10. Other health problems. RISKS AND COMPLICATIONS A trigger  point injection is a safe treatment. However, problems may develop, such as:  Minor side effects usually go away in 1 to 2 days. These may include:  Soreness.  Bruising.  Stiffness.  More serious problems are rare. But, they may include:  Bleeding under the skin (hematoma).  Skin infection.  Breaking off  of the needle under your skin.  Lung puncture.  The trigger point injection may not work for you. BEFORE THE PROCEDURE You may need to stop taking any medicine that thins your blood. This is to prevent bleeding and bruising. Usually these medicines are stopped several days before the injection. No other preparation is needed. PROCEDURE  A trigger point injection can be given in your caregiver's office or in a clinic. Each injection takes 2 minutes or less.  Your caregiver will feel for trigger points. The caregiver may use a marker to circle the area for the injection.  The skin over the trigger point will be washed with a germ-killing (antiseptic) solution.  The caregiver pinches the spot for the injection.  Then, a very thin needle is used for the shot. You may feel pain or a twitching feeling when the needle enters the trigger point.  A numbing solution may be injected into the trigger point. Sometimes a drug to keep down swelling, redness, and warmth (inflammation) is also injected.  Your caregiver moves the needle around the trigger zone until the tightness and twitching goes away.  After the injection, your caregiver may put gentle pressure over the injection site.  Then it is covered with a bandage. AFTER THE PROCEDURE  You can go right home after the injection.  The bandage can be taken off after a few hours.  You may feel sore and stiff for 1 to 2 days.  Go back to your regular activities slowly. Your caregiver may ask you to stretch your muscles. Do not do anything that takes extra energy for a few days.  Follow your caregiver's instructions to manage and  treat other pain.   This information is not intended to replace advice given to you by your health care provider. Make sure you discuss any questions you have with your health care provider.   Document Released: 09/23/2011 Document Revised: 01/29/2013 Document Reviewed: 09/23/2011 Elsevier Interactive Patient Education [employer].

## 2015-10-23 NOTE — Progress Notes (Signed)
   Subjective:    Patient ID: Yvonne Singleton, female    DOB: [birth year], 81 y.o.   MRN: [id number]  HPI  The patient is a 81 year old female who returns to pain management for further evaluation and treatment of pain involving the lower back and lower extremity region. The patient has history of pain involving the shoulders and upper extremity regions as well. The patient has significant pain involving the greater trochanteric region. We discussed patient's condition and will consider patient for greater trochanteric bursa injection at time return appointment. Is also concern regarding significant component of pain involving the lumbar lower extremity region being due to facet syndrome. We discussed patient undergoing lumbar facet, medial branch nerve, blocks. At the present time we will proceed with greater trochanteric bursa injection at time return appointment and consider additional modifications of treatment pending follow-up evaluation. The patient will continue Cymbalta Neurontin and oxycodone as prescribed. The patient agreed to suggested treatment plan. The patient denied any trauma change in events of daily living the cost change in symptomatology.      Review of Systems     Objective:   Physical Exam There was tends to palpation of the splenius capitis and occipitalis musculature regions of mild degree. There was tenderness of the acromial clavicular and glenohumeral joint region a mild to moderate degree. Patient was with decreased grip strength with well-healed surgical scars of the upper extremity. Tinel and Phalen's maneuver were associated with mild to moderate discomfort. There was decreased grip strength noted. There was unremarkable Spurling's maneuver. Palpation over the thoracic facet thoracic paraspinal must reason was with mild to moderate discomfort without crepitus of the thoracic region noted. Palpation over the lumbar paraspinal musculatures and lumbar facet region  was with moderate to moderately severe discomfort. With severe tends to palpation along the greater trochanteric region iliotibial band region especially on the right. There was tenderness over the PSIS and PII S region a moderate degree. Rotation lateral bending and extension and palpation of the lumbar facets reproduce moderate discomfort. Straight leg raising limited to approximately 20 without a definite increased pain with dorsiflexion noted. EHL strength appeared to be decreased. There was question decreased sensation along the L5 dermatomal distribution. There was negative clonus negative Homans. Abdomen nontender with no costovertebral tenderness noted.       Assessment & Plan:   Greater trochanteric bursitis  Degenerative changes lumbar spine L2-3, L3-4 degenerative changes with postoperative changes noted bilaterally, multilevel degenerative changes noted throughout the lumbar spine with neural foraminal narrowing and disc herniations as well  Lumbar stenosis with neurogenic claudication  Lumbar facet syndrome  Sacroiliac joint dysfunction  Status post total hip replacements  DJD of shoulders     PLAN   Continue present medication Neurontin and oxycodone  Continue Cymbalta as prescribed and take vitamin B complex as discussed  Greater trochanteric bursa injection to be performed at time of return appointment  F/U PCP Dr.N Humphrey Rolls  for evaliation of  BP and general medical condition as discussed  F/U surgical evaluation. Follow-up Dr. Marry Guan as planned  F/U Dr. Margaretmary Eddy as discussed  F/U Dr. Merita Norton as discussed  F/U neurological evaluation. Follow-up with Dr. Manuella Ghazi as discussed  May consider radiofrequency rhizolysis or intraspinal procedures pending response to present treatment and F/U evaluation   Patient to call [facility] should patient have concerns prior to scheduled return appointment

## 2015-11-19 ENCOUNTER — Encounter: Payer: Self-pay | Admitting: Pain Medicine

## 2015-11-19 ENCOUNTER — Ambulatory Visit: Payer: Medicare Other | Attending: Pain Medicine | Admitting: Pain Medicine

## 2015-11-19 VITALS — BP 124/62 | HR 65 | Temp 98.1°F | Resp 16 | Ht 63.5 in | Wt 183.0 lb

## 2015-11-19 DIAGNOSIS — M533 Sacrococcygeal disorders, not elsewhere classified: Secondary | ICD-10-CM

## 2015-11-19 DIAGNOSIS — Z9889 Other specified postprocedural states: Secondary | ICD-10-CM | POA: Diagnosis not present

## 2015-11-19 DIAGNOSIS — M19011 Primary osteoarthritis, right shoulder: Secondary | ICD-10-CM

## 2015-11-19 DIAGNOSIS — M545 Low back pain: Secondary | ICD-10-CM | POA: Diagnosis present

## 2015-11-19 DIAGNOSIS — M51369 Other intervertebral disc degeneration, lumbar region without mention of lumbar back pain or lower extremity pain: Secondary | ICD-10-CM

## 2015-11-19 DIAGNOSIS — M7061 Trochanteric bursitis, right hip: Secondary | ICD-10-CM

## 2015-11-19 DIAGNOSIS — M4806 Spinal stenosis, lumbar region: Secondary | ICD-10-CM | POA: Insufficient documentation

## 2015-11-19 DIAGNOSIS — M7060 Trochanteric bursitis, unspecified hip: Secondary | ICD-10-CM | POA: Insufficient documentation

## 2015-11-19 DIAGNOSIS — M19012 Primary osteoarthritis, left shoulder: Secondary | ICD-10-CM | POA: Insufficient documentation

## 2015-11-19 DIAGNOSIS — M47816 Spondylosis without myelopathy or radiculopathy, lumbar region: Secondary | ICD-10-CM | POA: Insufficient documentation

## 2015-11-19 DIAGNOSIS — M48062 Spinal stenosis, lumbar region with neurogenic claudication: Secondary | ICD-10-CM

## 2015-11-19 DIAGNOSIS — M5030 Other cervical disc degeneration, unspecified cervical region: Secondary | ICD-10-CM

## 2015-11-19 DIAGNOSIS — M546 Pain in thoracic spine: Secondary | ICD-10-CM | POA: Diagnosis present

## 2015-11-19 DIAGNOSIS — M5136 Other intervertebral disc degeneration, lumbar region: Secondary | ICD-10-CM

## 2015-11-19 DIAGNOSIS — M961 Postlaminectomy syndrome, not elsewhere classified: Secondary | ICD-10-CM

## 2015-11-19 DIAGNOSIS — Z96643 Presence of artificial hip joint, bilateral: Secondary | ICD-10-CM

## 2015-11-19 MED ORDER — OXYCODONE HCL 5 MG PO TABS: ORAL_TABLET | ORAL | Status: DC

## 2015-11-19 NOTE — Progress Notes (Signed)
   Subjective:    Patient ID: Yvonne Singleton, female    DOB: 10-Aug-1935, 81 y.o.   MRN: [id number]  HPI The patient is a 81 year old female who returns to pain management for further evaluation and treatment of pain involving the upper mid lower back regions. Shoulders and lower extremity regions. The patient denies any recent trauma change in events of daily living the call significant change in symptomatology. On today's visit we discussed patient's medications and patient will attempt to taper her oxycodone. We remain available to consider patient for additional modifications of treatment pending follow-up evaluation. The patient denied any trauma change in events of daily living the call significant change in symptomatology.   Review of Systems     Objective:   Physical Exam  There was tends to palpation of paraspinal must chew region of the cervical region cervical facet region palpation which reproduces mild discomfort. There was mild tenderness of the splenius capitis and occipitalis musculature regions. The patient was with decreased grip strength with well-healed surgical scars of the upper extremities with Tinel and Phalen's maneuver reproducing mild to moderate discomfort. There was decreased grip strength bilaterally. Palpation of the acromial clavicular and glenohumeral joint regions were with moderate tenderness to palpation as well the patient appeared to be unremarkable Spurling's maneuver. Palpation over the thoracic region thoracic facet region was attends to palpation with no crepitus of the thoracic region noted with moderate muscle spasms involving the thoracic paraspinal musculature region. Palpation over the lumbar paraspinal must reason lumbar facet region was with moderate discomfort as well the tenderness over the region of the PSIS and PII S region of moderate degree as well. There was tennis on the greater trochanteric region iliotibial band region a moderate degree  with straight leg raising tolerated to 20 without a definite increase of pain with dorsiflexion noted. There was negative clonus negative Homans. The knees were tenderness to palpation and decreased EHL strength was noted. There was negative clonus negative Homans. Abdomen was nontender with no costovertebral tenderness noted.      Assessment & Plan:     Greater trochanteric bursitis  Degenerative changes lumbar spine L2-3, L3-4 degenerative changes with postoperative changes noted bilaterally, multilevel degenerative changes noted throughout the lumbar spine with neural foraminal narrowing and disc herniations as well  Lumbar stenosis with neurogenic claudication  Lumbar facet syndrome  Sacroiliac joint dysfunction  Status post total hip replacements  DJD of shoulders     PLAN   Continue present medication Neurontin and oxycodone Continue vitamin B complex and Cymbalta as well. Decrease oxycodone gradually as we discussed in call to discuss your condition if you have any concerns  F/U PCP Dr.N Humphrey Rolls  for evaliation of  BP and general medical condition as discussed  F/U surgical evaluation. Follow-up Dr. Marry Guan as planned  F/U Dr. Margaretmary Eddy as discussed  F/U Dr. Merita Norton as discussed  F/U neurological evaluation. Follow-up with Dr. Manuella Ghazi as discussed  May consider radiofrequency rhizolysis or intraspinal procedures pending response to present treatment and F/U evaluation   Patient to call [facility] should patient have concerns prior to scheduled return appointment

## 2015-11-19 NOTE — Patient Instructions (Addendum)
   PLAN   Continue present medication Neurontin and oxycodone Continue vitamin B complex and Cymbalta as well. Decrease oxycodone gradually as we discussed in call to discuss your condition if you have any concerns  F/U PCP Dr.N Humphrey Rolls  for evaliation of  BP and general medical condition as discussed  F/U surgical evaluation. Follow-up Dr. Marry Guan as planned  F/U Dr. Margaretmary Eddy as discussed  F/U Dr. Merita Norton as discussed  F/U neurological evaluation. Follow-up with Dr. Manuella Ghazi as discussed  May consider radiofrequency rhizolysis or intraspinal procedures pending response to present treatment and F/U evaluation   Patient to call [facility] should patient have concerns prior to scheduled return appointment

## 2015-12-17 ENCOUNTER — Telehealth: Payer: Self-pay | Admitting: *Deleted

## 2015-12-17 ENCOUNTER — Encounter: Payer: Self-pay | Admitting: Pain Medicine

## 2015-12-17 ENCOUNTER — Ambulatory Visit: Payer: Medicare Other | Attending: Pain Medicine | Admitting: Pain Medicine

## 2015-12-17 VITALS — BP 100/69 | HR 77 | Temp 98.3°F | Resp 18 | Ht 63.5 in | Wt 178.0 lb

## 2015-12-17 DIAGNOSIS — M47816 Spondylosis without myelopathy or radiculopathy, lumbar region: Secondary | ICD-10-CM | POA: Diagnosis not present

## 2015-12-17 DIAGNOSIS — M4806 Spinal stenosis, lumbar region: Secondary | ICD-10-CM | POA: Insufficient documentation

## 2015-12-17 DIAGNOSIS — M7060 Trochanteric bursitis, unspecified hip: Secondary | ICD-10-CM | POA: Insufficient documentation

## 2015-12-17 DIAGNOSIS — M25512 Pain in left shoulder: Secondary | ICD-10-CM | POA: Diagnosis present

## 2015-12-17 DIAGNOSIS — M533 Sacrococcygeal disorders, not elsewhere classified: Secondary | ICD-10-CM | POA: Diagnosis not present

## 2015-12-17 DIAGNOSIS — Z9889 Other specified postprocedural states: Secondary | ICD-10-CM | POA: Diagnosis not present

## 2015-12-17 DIAGNOSIS — M5136 Other intervertebral disc degeneration, lumbar region: Secondary | ICD-10-CM

## 2015-12-17 DIAGNOSIS — M5030 Other cervical disc degeneration, unspecified cervical region: Secondary | ICD-10-CM

## 2015-12-17 DIAGNOSIS — M542 Cervicalgia: Secondary | ICD-10-CM | POA: Diagnosis present

## 2015-12-17 DIAGNOSIS — M19011 Primary osteoarthritis, right shoulder: Secondary | ICD-10-CM | POA: Insufficient documentation

## 2015-12-17 DIAGNOSIS — M961 Postlaminectomy syndrome, not elsewhere classified: Secondary | ICD-10-CM

## 2015-12-17 DIAGNOSIS — M19012 Primary osteoarthritis, left shoulder: Secondary | ICD-10-CM | POA: Insufficient documentation

## 2015-12-17 DIAGNOSIS — Z96643 Presence of artificial hip joint, bilateral: Secondary | ICD-10-CM | POA: Insufficient documentation

## 2015-12-17 DIAGNOSIS — M48062 Spinal stenosis, lumbar region with neurogenic claudication: Secondary | ICD-10-CM

## 2015-12-17 DIAGNOSIS — M25511 Pain in right shoulder: Secondary | ICD-10-CM | POA: Diagnosis present

## 2015-12-17 DIAGNOSIS — M7061 Trochanteric bursitis, right hip: Secondary | ICD-10-CM

## 2015-12-17 MED ORDER — OXYCODONE HCL 5 MG PO TABS: ORAL_TABLET | ORAL | Status: DC

## 2015-12-17 NOTE — Telephone Encounter (Signed)
Scheduled doctors orders Return in about 1 month (around 01/17/2016) for EVAL.Marland KitchenMarland KitchenTD

## 2015-12-17 NOTE — Progress Notes (Signed)
Subjective:    Patient ID: Yvonne Singleton, female    DOB: [birth year], 81 y.o.   MRN: [id number]  HPI   The patient is a 81 year old female who returns to pain management for further evaluation and treatment of pain involving the neck upper extremities shoulders wrist hands mid back lower back and hips and lower extremity regions. The patient states the pain is fairly well-controlled at this time. The patient is with medications consisting of Neurontin Cymbalta oxycodone. We discussed patient's medications and will continue presently prescribed medications. The patient has reduced her oxycodone requirement. We will continue at the reduced dose of oxycodone at this time and we will remain available to proceed with interventional treatment as well as modification of medications and other aspects of patient's treatment regimen as needed the patient was with understanding and agreed with suggested treatment plan. The patient states that she is able to perform most activities of daily living without experiencing severe disabling pain. The patient also is without significant difficulty obtaining restful sleep. The patient denies any trauma change in events of daily living of significant degree. We discussed patient's condition and will consider patient for additional modifications of treatment pending follow-up evaluation. All were understanding and agreement suggested treatment plan       Review of Systems     Objective:   Physical Exam  there was tenderness to palpation of the paraspinal misreading the cervical region cervical facet region a mild degree. Palpation over the cervical facet cervical paraspinal musculature region was with mild tenderness to palpation. Palpation of the acromioclavicular and glenohumeral joint regions reproduce moderate discomfort. There was unremarkable Spurling's maneuver. The patient was with decreased grip strength with well-healed surgical scars of the upper  extremities noted. Tinel and Phalen's maneuver were without discomfort of moderate degree. Palpation over the thoracic facet thoracic paraspinal musculature region reveal patient to be with moderate muscle spasm of the mid and lower thoracic region with no crepitus of the thoracic region noted. Palpation over the lumbar paraspinal musculature region lumbar facet region was with moderate tenderness to palpation with moderate muscle spasm of the lumbar paraspinal musculature region. Palpation of the gluteal and piriformis musculature regions reproduce mild to moderate discomfort. There was moderate tenderness of the PSIS and PII S regions. EHL strength was reduced. Patient was without definite sensory deficit or dermatomal distribution detected. EHL strength was decreased. It was tenderness over the region of the PSIS and PII S regions a moderate degree. Palpation of the greater trochanteric region iliotibial band region reproduced moderate discomfort as well. There was negative clonus negative Homans. Abdomen was nontender with no costovertebral tenderness noted       Assessment & Plan:     Greater trochanteric bursitis  Degenerative changes lumbar spine L2-3, L3-4 degenerative changes with postoperative changes noted bilaterally, multilevel degenerative changes noted throughout the lumbar spine with neural foraminal narrowing and disc herniations as well  Lumbar stenosis with neurogenic claudication  Lumbar facet syndrome  Sacroiliac joint dysfunction  Status post total hip replacements  DJD of shoulders       PLAN   Continue present medication Neurontin and oxycodone Continue vitamin B complex and Cymbalta as well.  F/U PCP Dr.N Humphrey Rolls  for evaliation of  BP and general medical condition as discussed  F/U surgical evaluation. Follow-up Dr. Marry Guan as planned  F/U Dr. Margaretmary Singleton as discussed  F/U Dr. Merita Singleton as discussed  F/U neurological evaluation. Follow-up with Dr. Manuella Singleton  as discussed  May consider radiofrequency rhizolysis or intraspinal procedures pending response to present treatment and F/U evaluation   Patient to call [facility] should patient have concerns prior to scheduled return

## 2015-12-17 NOTE — Patient Instructions (Signed)
   PLAN   Continue present medication Neurontin and oxycodone Continue vitamin B complex and Cymbalta as well.  F/U PCP Dr.N Humphrey Rolls  for evaliation of  BP and general medical condition as discussed  F/U surgical evaluation. Follow-up Dr. Marry Guan as planned  F/U Dr. Margaretmary Eddy as discussed  F/U Dr. Merita Norton as discussed  F/U neurological evaluation. Follow-up with Dr. Manuella Ghazi as discussed  May consider radiofrequency rhizolysis or intraspinal procedures pending response to present treatment and F/U evaluation   Patient to call [facility] should patient have concerns prior to scheduled return appointment

## 2016-01-15 ENCOUNTER — Ambulatory Visit: Payer: Medicare Other | Admitting: Pain Medicine

## 2016-01-26 ENCOUNTER — Ambulatory Visit: Payer: Medicare Other | Attending: Pain Medicine | Admitting: Pain Medicine

## 2016-01-26 ENCOUNTER — Encounter: Payer: Self-pay | Admitting: Pain Medicine

## 2016-01-26 VITALS — BP 126/69 | HR 76 | Temp 98.2°F | Resp 14 | Ht 64.0 in | Wt 182.0 lb

## 2016-01-26 DIAGNOSIS — M961 Postlaminectomy syndrome, not elsewhere classified: Secondary | ICD-10-CM

## 2016-01-26 DIAGNOSIS — M47816 Spondylosis without myelopathy or radiculopathy, lumbar region: Secondary | ICD-10-CM | POA: Diagnosis not present

## 2016-01-26 DIAGNOSIS — M19011 Primary osteoarthritis, right shoulder: Secondary | ICD-10-CM | POA: Insufficient documentation

## 2016-01-26 DIAGNOSIS — M79606 Pain in leg, unspecified: Secondary | ICD-10-CM | POA: Diagnosis present

## 2016-01-26 DIAGNOSIS — Z96643 Presence of artificial hip joint, bilateral: Secondary | ICD-10-CM | POA: Diagnosis not present

## 2016-01-26 DIAGNOSIS — M533 Sacrococcygeal disorders, not elsewhere classified: Secondary | ICD-10-CM | POA: Diagnosis not present

## 2016-01-26 DIAGNOSIS — M4806 Spinal stenosis, lumbar region: Secondary | ICD-10-CM | POA: Diagnosis not present

## 2016-01-26 DIAGNOSIS — M7061 Trochanteric bursitis, right hip: Secondary | ICD-10-CM

## 2016-01-26 DIAGNOSIS — M7060 Trochanteric bursitis, unspecified hip: Secondary | ICD-10-CM | POA: Diagnosis not present

## 2016-01-26 DIAGNOSIS — M48062 Spinal stenosis, lumbar region with neurogenic claudication: Secondary | ICD-10-CM

## 2016-01-26 DIAGNOSIS — M5126 Other intervertebral disc displacement, lumbar region: Secondary | ICD-10-CM | POA: Insufficient documentation

## 2016-01-26 DIAGNOSIS — M542 Cervicalgia: Secondary | ICD-10-CM | POA: Diagnosis present

## 2016-01-26 DIAGNOSIS — M546 Pain in thoracic spine: Secondary | ICD-10-CM | POA: Diagnosis present

## 2016-01-26 DIAGNOSIS — M5136 Other intervertebral disc degeneration, lumbar region: Secondary | ICD-10-CM

## 2016-01-26 DIAGNOSIS — M19012 Primary osteoarthritis, left shoulder: Secondary | ICD-10-CM | POA: Insufficient documentation

## 2016-01-26 DIAGNOSIS — Z9889 Other specified postprocedural states: Secondary | ICD-10-CM

## 2016-01-26 DIAGNOSIS — M5030 Other cervical disc degeneration, unspecified cervical region: Secondary | ICD-10-CM

## 2016-01-26 MED ORDER — OXYCODONE HCL 5 MG PO TABS: ORAL_TABLET | ORAL | Status: DC

## 2016-01-26 MED ORDER — GABAPENTIN 100 MG PO CAPS: 100.0000 mg | ORAL_CAPSULE | Freq: Every day | ORAL | Status: DC

## 2016-01-26 NOTE — Progress Notes (Signed)
   Subjective:    Patient ID: Yvonne Singleton, female    DOB: 19-Jul-1935, 81 y.o.   MRN: [id number]  HPI  The patient is a 81 year old female who returns to pain management for further evaluation and treatment of pain involving the neck entire back upper and lower extremity regions. The patient is with pain which is involving the shoulders and upper extremity regions wrist hands entire back upper and lower extremity regions. The patient has been able to reduce her oxycodone consumption and present time states that she has decreased her oxycodone to 2 oxycodone per day. The patient denied any significant side effects with at time tapering of her medications and states that she appears to be able to function fairly well at this time. We discussed interventional treatment which we will avoid at this time and we'll continue to observe patient's response to the decrease in the consumption of oxycodone. The patient states that she is able to perform activities of daily living without experiencing severe disabling pain. We will remain available to consider modification of treatment pending response to treatment and follow-up evaluation. All agreed to suggested treatment plan    Review of Systems     Objective:   Physical Exam  There was mild tenderness of the splenius capitis and occipitalis musculature region. Palpation over the region of the acromioclavicular and glenohumeral joint region was with moderate discomfort. The patient was with decreased grip strength and Tinel and Phalen's maneuver were associated with increased pain of moderate degree. The patient was at unremarkable Spurling's maneuver There was tenderness over the thoracic facet thoracic paraspinal musculature region with no crepitus of the thoracic region noted. Palpation over the region of the lumbar paraspinal must reason lumbar facet region was with moderate tenderness to palpation with lateral bending rotation extension and  palpation of the lumbar facets palpation of the PSIS and PII S region reproduces moderate discomfort. There was mild to moderate tenderness along the greater trochanteric region iliotibial band region. Straight leg raise was tolerates approximately 20 without a definite increase of pain with dorsiflexion noted. There was negative clonus negative Homans. No sensory deficit or dermatomal distribution detected. Abdomen soft nontender and no costovertebral tenderness noted reproducing moderate discomfort.      Assessment & Plan:     Greater trochanteric bursitis  Degenerative changes lumbar spine L2-3, L3-4 degenerative changes with postoperative changes noted bilaterally, multilevel degenerative changes noted throughout the lumbar spine with neural foraminal narrowing and disc herniations as well  Lumbar stenosis with neurogenic claudication  Lumbar facet syndrome  Sacroiliac joint dysfunction  Status post total hip replacements  DJD of shoulders         PLAN   Continue present medication Neurontin and oxycodone Continue vitamin B complex and Cymbalta as well.  F/U PCP Dr.N Humphrey Rolls  for evaliation of  BP and general medical condition as discussed  F/U surgical evaluation. Follow-up Dr. Marry Guan as planned  F/U Dr. Margaretmary Eddy as discussed  F/U Dr. Merita Norton as discussed  F/U neurological evaluation. Follow-up with Dr. Manuella Ghazi as discussed  May consider radiofrequency rhizolysis or intraspinal procedures pending response to present treatment and F/U evaluation   Patient to call [facility] should patient have concerns prior to scheduled return appointment

## 2016-01-26 NOTE — Patient Instructions (Signed)
   PLAN   Continue present medication Neurontin and oxycodone Continue vitamin B complex and Cymbalta as well.  F/U PCP Dr.N Humphrey Rolls  for evaliation of  BP and general medical condition as discussed  F/U surgical evaluation. Follow-up Dr. Marry Guan as planned  F/U Dr. Margaretmary Eddy as discussed  F/U Dr. Merita Norton as discussed  F/U neurological evaluation. Follow-up with Dr. Manuella Ghazi as discussed  May consider radiofrequency rhizolysis or intraspinal procedures pending response to present treatment and F/U evaluation   Patient to call [facility] should patient have concerns prior to scheduled return appointment

## 2016-02-09 ENCOUNTER — Emergency Department: Payer: Medicare Other | Admitting: Anesthesiology

## 2016-02-09 ENCOUNTER — Emergency Department: Payer: Medicare Other

## 2016-02-09 ENCOUNTER — Encounter
Admission: EM | Disposition: A | Discharge status: Home / Self Care | Payer: Self-pay | Source: Home / Self Care | Attending: Emergency Medicine

## 2016-02-09 ENCOUNTER — Emergency Department
Admission: EM | Admit: 2016-02-09 | Discharge: 2016-02-09 | Disposition: A | Discharge status: Home / Self Care | Payer: Medicare Other | Attending: Emergency Medicine | Admitting: Emergency Medicine

## 2016-02-09 DIAGNOSIS — Z419 Encounter for procedure for purposes other than remedying health state, unspecified: Secondary | ICD-10-CM

## 2016-02-09 DIAGNOSIS — G20 Parkinson's disease: Secondary | ICD-10-CM | POA: Insufficient documentation

## 2016-02-09 DIAGNOSIS — Z91041 Radiographic dye allergy status: Secondary | ICD-10-CM | POA: Insufficient documentation

## 2016-02-09 DIAGNOSIS — I10 Essential (primary) hypertension: Secondary | ICD-10-CM | POA: Insufficient documentation

## 2016-02-09 DIAGNOSIS — Z888 Allergy status to other drugs, medicaments and biological substances status: Secondary | ICD-10-CM | POA: Insufficient documentation

## 2016-02-09 DIAGNOSIS — Z96643 Presence of artificial hip joint, bilateral: Secondary | ICD-10-CM | POA: Insufficient documentation

## 2016-02-09 DIAGNOSIS — Z881 Allergy status to other antibiotic agents status: Secondary | ICD-10-CM | POA: Insufficient documentation

## 2016-02-09 DIAGNOSIS — G4730 Sleep apnea, unspecified: Secondary | ICD-10-CM | POA: Insufficient documentation

## 2016-02-09 DIAGNOSIS — Z853 Personal history of malignant neoplasm of breast: Secondary | ICD-10-CM | POA: Insufficient documentation

## 2016-02-09 DIAGNOSIS — Z90710 Acquired absence of both cervix and uterus: Secondary | ICD-10-CM | POA: Diagnosis not present

## 2016-02-09 DIAGNOSIS — S73005A Unspecified dislocation of left hip, initial encounter: Secondary | ICD-10-CM

## 2016-02-09 DIAGNOSIS — M25559 Pain in unspecified hip: Secondary | ICD-10-CM | POA: Diagnosis present

## 2016-02-09 DIAGNOSIS — M109 Gout, unspecified: Secondary | ICD-10-CM | POA: Insufficient documentation

## 2016-02-09 DIAGNOSIS — M1990 Unspecified osteoarthritis, unspecified site: Secondary | ICD-10-CM | POA: Diagnosis not present

## 2016-02-09 HISTORY — PX: HIP CLOSED REDUCTION: SHX983

## 2016-02-09 LAB — BASIC METABOLIC PANEL
Anion gap: 7 (ref 5–15)
BUN: 33 mg/dL — AB (ref 6–20)
CALCIUM: 9.8 mg/dL (ref 8.9–10.3)
CO2: 26 mmol/L (ref 22–32)
CREATININE: 0.8 mg/dL (ref 0.44–1.00)
Chloride: 106 mmol/L (ref 101–111)
GFR calc Af Amer: >60 mL/min (ref >60)
Glucose, Bld: 84 mg/dL (ref 65–99)
POTASSIUM: 4.9 mmol/L (ref 3.5–5.1)
SODIUM: 139 mmol/L (ref 135–145)

## 2016-02-09 LAB — CBC WITH DIFFERENTIAL/PLATELET
BASOS ABS: 0 10*3/uL (ref 0–0.1)
BASOS PCT: 0 %
Eosinophils Absolute: 0.2 10*3/uL (ref 0–0.7)
Eosinophils Relative: 3 %
HEMATOCRIT: 40.9 % (ref 35.0–47.0)
HEMOGLOBIN: 13.8 g/dL (ref 12.0–16.0)
LYMPHS PCT: 19 %
Lymphs Abs: 1.4 10*3/uL (ref 1.0–3.6)
MCH: 32.5 pg (ref 26.0–34.0)
MCHC: 33.7 g/dL (ref 32.0–36.0)
MCV: 96.5 fL (ref 80.0–100.0)
MONOS PCT: 6 %
Monocytes Absolute: 0.4 10*3/uL (ref 0.2–0.9)
NEUTROS ABS: 5.3 10*3/uL (ref 1.4–6.5)
NEUTROS PCT: 72 %
PLATELETS: 219 10*3/uL (ref 150–440)
RBC: 4.24 MIL/uL (ref 3.80–5.20)
RDW: 13.8 % (ref 11.5–14.5)
WBC: 7.3 10*3/uL (ref 3.6–11.0)

## 2016-02-09 SURGERY — CLOSED MANIPULATION, JOINT, HIP
Anesthesia: General | Site: Hip | Laterality: Left | Wound class: Clean

## 2016-02-09 MED ORDER — MIDAZOLAM HCL 2 MG/2ML IJ SOLN
INTRAMUSCULAR | Status: DC | PRN
  Administered 2016-02-09: 1 mg via INTRAVENOUS

## 2016-02-09 MED ORDER — ETOMIDATE 2 MG/ML IV SOLN
10.0000 mg | Freq: Once | INTRAVENOUS | Status: AC
  Administered 2016-02-09: 10 mg via INTRAVENOUS
  Filled 2016-02-09: qty 10

## 2016-02-09 MED ORDER — CARBIDOPA-LEVODOPA ER 50-200 MG PO TBCR: 1.0000 | EXTENDED_RELEASE_TABLET | Freq: Two times a day (BID) | ORAL | Status: DC

## 2016-02-09 MED ORDER — FENTANYL CITRATE (PF) 100 MCG/2ML IJ SOLN
INTRAMUSCULAR | Status: DC | PRN
Start: 2016-02-09 — End: 2016-02-09
  Administered 2016-02-09 (×2): 50 ug via INTRAVENOUS

## 2016-02-09 MED ORDER — ONDANSETRON HCL 4 MG/2ML IJ SOLN
4.0000 mg | Freq: Once | INTRAMUSCULAR | Status: AC
  Administered 2016-02-09: 4 mg via INTRAVENOUS
  Filled 2016-02-09: qty 2

## 2016-02-09 MED ORDER — MORPHINE SULFATE (PF) 4 MG/ML IV SOLN
4.0000 mg | Freq: Once | INTRAVENOUS | Status: AC
  Administered 2016-02-09: 4 mg via INTRAVENOUS
  Filled 2016-02-09: qty 1

## 2016-02-09 MED ORDER — ALLOPURINOL 100 MG PO TABS
100.0000 mg | ORAL_TABLET | Freq: Every day | ORAL | Status: DC
  Filled 2016-02-09: qty 1

## 2016-02-09 MED ORDER — MORPHINE SULFATE (PF) 2 MG/ML IV SOLN: 2.0000 mg | Freq: Once | INTRAVENOUS | Status: DC

## 2016-02-09 MED ORDER — FENTANYL CITRATE (PF) 100 MCG/2ML IJ SOLN
25.0000 ug | INTRAMUSCULAR | Status: AC | PRN
  Administered 2016-02-09 (×6): 25 ug via INTRAVENOUS

## 2016-02-09 MED ORDER — MORPHINE SULFATE (PF) 2 MG/ML IV SOLN
2.0000 mg | Freq: Once | INTRAVENOUS | Status: AC
  Administered 2016-02-09: 2 mg via INTRAVENOUS
  Filled 2016-02-09: qty 1

## 2016-02-09 MED ORDER — ONDANSETRON HCL 4 MG/2ML IJ SOLN: 4.0000 mg | Freq: Once | INTRAMUSCULAR | Status: DC | PRN

## 2016-02-09 MED ORDER — LOSARTAN POTASSIUM 25 MG PO TABS: 100.0000 mg | ORAL_TABLET | Freq: Every day | ORAL | Status: DC

## 2016-02-09 MED ORDER — LACTATED RINGERS IV SOLN
INTRAVENOUS | Status: DC | PRN
  Administered 2016-02-09: 19:00:00 via INTRAVENOUS

## 2016-02-09 MED ORDER — PROPOFOL 10 MG/ML IV BOLUS
INTRAVENOUS | Status: DC | PRN
  Administered 2016-02-09: 70 mg via INTRAVENOUS

## 2016-02-09 MED ORDER — FENTANYL CITRATE (PF) 100 MCG/2ML IJ SOLN
INTRAMUSCULAR | Status: AC
  Administered 2016-02-09: 25 ug
  Filled 2016-02-09: qty 2

## 2016-02-09 MED ORDER — MORPHINE SULFATE (PF) 2 MG/ML IV SOLN
INTRAVENOUS | Status: AC
  Administered 2016-02-09: 2 mg via INTRAVENOUS
  Filled 2016-02-09: qty 1

## 2016-02-09 MED ORDER — LIDOCAINE HCL (CARDIAC) 20 MG/ML IV SOLN
INTRAVENOUS | Status: DC | PRN
  Administered 2016-02-09: 10 mg via INTRAVENOUS

## 2016-02-09 MED ORDER — HYDROCODONE-ACETAMINOPHEN 5-325 MG PO TABS: 1.0000 | ORAL_TABLET | Freq: Four times a day (QID) | ORAL | Status: DC | PRN

## 2016-02-09 MED ORDER — HYDROXYCHLOROQUINE SULFATE 200 MG PO TABS: 200.0000 mg | ORAL_TABLET | Freq: Two times a day (BID) | ORAL | Status: DC

## 2016-02-09 MED ORDER — MORPHINE SULFATE (PF) 2 MG/ML IV SOLN
2.0000 mg | Freq: Once | INTRAVENOUS | Status: AC
Start: 2016-02-09 — End: 2016-02-09
  Administered 2016-02-09: 2 mg via INTRAVENOUS

## 2016-02-09 MED ORDER — FENTANYL CITRATE (PF) 100 MCG/2ML IJ SOLN
INTRAMUSCULAR | Status: AC
  Filled 2016-02-09: qty 2

## 2016-02-09 SURGICAL SUPPLY — 1 items: IMMBOLIZER KNEE 19 BLUE UNIV (SOFTGOODS) ×1 IMPLANT

## 2016-02-09 NOTE — Transfer of Care (Signed)
Immediate Anesthesia Transfer of Care Note  Patient: Yvonne Singleton  Procedure(s) Performed: Procedure(s): CLOSED MANIPULATION HIP (Left)  Patient Location: PACU  Anesthesia Type:General  Level of Consciousness: awake, alert , oriented and patient cooperative  Airway & Oxygen Therapy: Patient Spontanous Breathing  Post-op Assessment: Report given to RN and Post -op Vital signs reviewed and stable  Post vital signs: Reviewed and stable  Last Vitals:  Filed Vitals:   02/09/16 1700 02/09/16 1730  BP: 112/58 119/62  Pulse:  62  Temp:    Resp: 12 12    Complications: No apparent anesthesia complications

## 2016-02-09 NOTE — OR Nursing (Signed)
Patient discharged home via wheelchair at 2025.

## 2016-02-09 NOTE — Op Note (Signed)
ORDATE@  7:00 PM  PATIENT:  Yvonne Singleton    PRE-OPERATIVE DIAGNOSIS:  dislocated left hip  POST-OPERATIVE DIAGNOSIS:  Same  PROCEDURE:  CLOSED MANIPULATION HIP  SURGEON:  Park Breed, MD   ANESTHESIA:     IV Sedation  PREOPERATIVE INDICATIONS:  Yvonne Singleton is a  81 y.o. female with a diagnosis of dislocated left hip who failed conservative measures and elected for surgical management.    The risks benefits and alternatives were discussed with the patient preoperatively including but not limited to the risks of infection, bleeding, nerve injury, cardiopulmonary complications, the need for revision surgery, among others, and the patient was willing to proceed.  OPERATIVE IMPLANTS: None  OPERATIVE FINDINGS: Dislocated total hip left  EBL: None  COMPLICATIONS:   None  OPERATIVE PROCEDURE: The patient underwent satisfactory IV sedation in the supine position on the operating room table.  The dislocated hip was seen to be short and unstable.  Using manual traction directed distally and anteriorly the hip was reduced with a satisfactory pop.  Range of motion was stable and leg lengths were restored.  The fluoroscopy was used to show that the hip was indeed reduced.  A knee immobilizer was applied to prevent hip and knee flexion.  The patient was awakened and taken to recovery in good condition.  Park Breed, MD

## 2016-02-09 NOTE — Discharge Instructions (Signed)
Keep immobilizer in place.

## 2016-02-09 NOTE — ED Provider Notes (Signed)
[facility] Emergency Department Provider Note   ____________________________________________  Time seen: Approximately 12 I have reviewed the triage vital signs and the triage nursing note.  HISTORY  Chief Complaint Hip Pain   Historian Patient  HPI Yvonne Singleton is a 81 y.o. female with hx of bilateral hip replacements and prior prosthetic hip reduction in the OR, last Sept 2016.  Today she was reaching across her waist and felt a pop in the left hip and then had trouble getting up, feeling like prior hip dislocation. Pain is moderate. Movement makes it worse. Feels similar to prior. Her orthopedist is Dr. Marry Guan.    Past Medical History  Diagnosis Date  . Gout   . Parkinson's disease ([place])   . Back pain   . Hypertension   . Hard of hearing   . Sleep apnea   . Cancer [facility]) 2007    right breast  . Breast cancer [facility]) 2007    right breast lumpectomy with rad tx    Patient Active Problem List   Diagnosis Date Noted  . History of bilateral hip replacements 05/28/2015  . Spinal stenosis, lumbar region, with neurogenic claudication 05/28/2015  . DDD (degenerative disc disease), lumbar 03/13/2015  . Lumbar post-laminectomy syndrome 03/13/2015  . Degenerative cervical disc 03/13/2015  . Status post bilateral total hip replacement 03/13/2015  . Sacroiliac joint dysfunction 03/13/2015  . History of surgery on upper extremity 03/13/2015  . DJD of shoulder 03/13/2015  . Rheumatoid arthritis ([place]) 02/17/2015    Past Surgical History  Procedure Laterality Date  . Hip surgery    . Eye surgery Bilateral   . Cataract extraction Bilateral   . Joint replacement      bilateral hip  . Total knee arthroplasty Bilateral   . Abdominal hysterectomy    . Hand surgery    . Hip closed reduction Left 06/24/2015    Procedure: CLOSED REDUCTION HIP;  Surgeon: Dereck Leep, MD;  Location: [facility] ORS;  Service: Orthopedics;  Laterality: Left;    Current  Outpatient Rx  Name  Route  Sig  Dispense  Refill  . alendronate (FOSAMAX) 70 MG tablet   Oral   Take 70 mg by mouth once a week. Pt takes on Wednesday.   Take with a full glass of water on an empty stomach.         Marland Kitchen allopurinol (ZYLOPRIM) 100 MG tablet   Oral   Take 200 mg by mouth at bedtime.          Marland Kitchen b complex vitamins tablet   Oral   Take 1 tablet by mouth daily.         . Calcium Carbonate-Vitamin D (CALCIUM 600+D) 600-200 MG-UNIT TABS   Oral   Take 1 tablet by mouth daily.         . carbidopa-levodopa (SINEMET CR) 50-200 MG per tablet   Oral   Take 1 tablet by mouth at bedtime.          . cholecalciferol (VITAMIN D) 1000 units tablet   Oral   Take 2,000 Units by mouth daily.         . DULoxetine (CYMBALTA) 30 MG capsule   Oral   Take 30 mg by mouth daily.         . furosemide (LASIX) 20 MG tablet   Oral   Take 20 mg by mouth daily.          Marland Kitchen gabapentin (NEURONTIN) 100 MG capsule  Oral   Take 100 mg by mouth at bedtime.         . hydroxychloroquine (PLAQUENIL) 200 MG tablet   Oral   Take 400 mg by mouth daily.          Marland Kitchen losartan (COZAAR) 100 MG tablet   Oral   Take 100 mg by mouth daily.          Marland Kitchen oxyCODONE (OXY IR/ROXICODONE) 5 MG immediate release tablet   Oral   Take 5 mg by mouth 3 (three) times daily as needed for severe pain.         Marland Kitchen tolterodine (DETROL LA) 4 MG 24 hr capsule   Oral   Take 4 mg by mouth daily.           Allergies Trospium; Iodinated diagnostic agents; and Streptomycin  Family History  Problem Relation Age of Onset  . Heart disease Mother   . Arthritis Mother   . Heart disease Father   . Ulcers Father   . Breast cancer Maternal Aunt     Social History Social History  Substance Use Topics  . Smoking status: Never Smoker   . Smokeless tobacco: None  . Alcohol Use: No    Review of Systems  Constitutional: Negative for fever. Eyes: Negative for visual changes. ENT: Negative for  sore throat. Cardiovascular: Negative for chest pain. Respiratory: Negative for shortness of breath. Gastrointestinal: Negative for abdominal pain, vomiting and diarrhea. Genitourinary: Negative for dysuria. Musculoskeletal: Negative for back pain. Skin: Negative for rash. Neurological: Negative for headache. 10 point Review of Systems otherwise negative ____________________________________________   PHYSICAL EXAM:  VITAL SIGNS: ED Triage Vitals  Enc Vitals Group     BP 02/09/16 1102 123/61 mmHg     Pulse Rate 02/09/16 1102 60     Resp 02/09/16 1102 18     Temp 02/09/16 1102 98 F (36.7 C)     Temp Source 02/09/16 1102 Oral     SpO2 02/09/16 1102 96 %     Weight 02/09/16 1102 178 lb (80.74 kg)     Height 02/09/16 1102 5\' 4"  (1.626 m)     Head Cir --      Peak Flow --      Pain Score 02/09/16 1103 8     Pain Loc --      Pain Edu? --      Excl. in [place]? --      Constitutional: Alert and oriented. Well appearing and in no distress. HEENT   Head: Normocephalic and atraumatic.      Eyes: Conjunctivae are normal. PERRL. Normal extraocular movements.      Ears:         Nose: No congestion/rhinnorhea.   Mouth/Throat: Mucous membranes are moist.   Neck: No stridor. Cardiovascular/Chest: Normal rate, regular rhythm.  No murmurs, rubs, or gallops. Respiratory: Normal respiratory effort without tachypnea nor retractions. Breath sounds are clear and equal bilaterally. No wheezes/rales/rhonchi. Gastrointestinal: Soft. No distention, no guarding, no rebound. Nontender.   Genitourinary/rectal:Deferred Musculoskeletal: Pelvis stable. Left hip pain with any range of motion. Left leg shortened.  No edema. Neurologic:  Normal speech and language. No gross or focal neurologic deficits are appreciated. Skin:  Skin is warm, dry and intact. No rash noted. Psychiatric: Mood and affect are normal. Speech and behavior are normal. Patient exhibits appropriate insight and  judgment.  ____________________________________________   EKG I, Lisa Roca, MD, the attending physician have personally viewed and interpreted all ECGs.  [place]  bpm. Normal sinus rhythm. Normal axis. Narrow QRS. Nonspecific T-wave ____________________________________________  LABS (pertinent positives/negatives)  Basic metabolic panel within normal limits except BUN 33 CBC within normal limits  ____________________________________________  RADIOLOGY All Xrays were viewed by me. Imaging interpreted by Radiologist.  Left hip and pelvis: Left total hip arthroplasty is dislocated.  Repeat left hip: Persistent left hip arthroplasty dislocation.  Chest portable: IMPRESSION: Probable chronic mild interstitial prominence again noted without convincing pulmonary edema. Mild basilar atelectasis without segmental infiltrate. __________________________________________  PROCEDURES  Procedure(s) performed: Procedural sedation Performed by: Lisa Roca Consent: Verbal consent obtained. Risks and benefits: risks, benefits and alternatives were discussed Required items: required blood products, implants, devices, and special equipment available Patient identity confirmed: arm band and provided demographic data Time out: Immediately prior to procedure a "time out" was called to verify the correct patient, procedure, equipment, support staff and site/side marked as required.  Sedation type: moderate (conscious) sedation NPO time confirmed and considedered  Sedatives: IV etomidate, 10mg   Physician Time at Bedside: 30 minutes  Vitals: Vital signs were monitored during sedation. Cardiac Monitor, pulse oximeter Patient tolerance: Patient tolerated the procedure well with no immediate complications. Comments: Pt with uneventful recovered. Returned to pre-procedural sedation baseline    Reduction of dislocation Date/Time: 4:27 PM Performed by: Lisa Roca Authorized by: Lisa Roca Consent: Verbal consent obtained. Risks and benefits: risks, benefits and alternatives were discussed Consent given by: patient Required items: required blood products, implants, devices, and special equipment available Time out: Immediately prior to procedure a "time out" was called to verify the correct patient, procedure, equipment, support staff and site/side marked as required.  Patient sedated: moderate sedation, etomidate  Vitals: Vital signs were monitored during sedation. Patient tolerance: Patient tolerated the procedure well with no immediate complications. Joint: Left hip arthoplasty dislocation, anterior Reduction technique:  Traction, countertraction with manual manipulation and rotation  Felt unsuccessful, and confirmed still dislocated by repeat xray   Critical Care performed: None  ____________________________________________   ED COURSE / ASSESSMENT AND PLAN  Pertinent labs & imaging results that were available during my care of the patient were reviewed by me and considered in my medical decision making (see chart for details).  NV intact.  Treated for pain.  Confirmed dislocated.  Discussed with Dr. Marry Guan, patients orthopedic surgeon, who is unavailable for operative closed reduction.  I discussed/consulted Dr. Sabra Heck who was in operating room and requested I attempt sedation with reduction in the ER.  Unfortunately after attempts, was unable to reduce the dislocation.  I let Dr. Sabra Heck know, who requested a medicine consult prior to operative closed reduction this afternoon.    CONSULTATIONS:   Phone discussion with our nurse for Dr. Marry Guan, he is in surgery and asked that I consult with on-call unassigned orthopedist, this is Dr. Sabra Heck. I spoke with Dr. Sabra Heck who asked I try to reduce given he is in the OR for several hours.   After I attempted reduction, still dislocated, I let Dr. Sabra Heck know she will need operative reduction.   Patient / Family  / Caregiver informed of clinical course, medical decision-making process, and agree with plan.   ___________________________________________   FINAL CLINICAL IMPRESSION(S) / ED DIAGNOSES   Final diagnoses:  Hip dislocation, left, initial encounter [facility])  Hip pain              Note: This dictation was prepared with Dragon dictation. Any transcriptional errors that result from this process are unintentional   Lisa Roca, MD  02/09/16 1631 

## 2016-02-09 NOTE — ED Notes (Signed)
Pt comes into the ED via EMS from home, states she was in the process of standing and reached across her lap to get something at the same time and felt something pop in her left hip.. States she has a hx of hip replacement with dislocation in the past..

## 2016-02-09 NOTE — Consult Note (Addendum)
Patient Demographics  Yvonne Singleton, is a 81 y.o. female   MRN: [id number]   DOB - 12-Dec-1934  Admit Date - 02/09/2016    Outpatient Primary MD for the patient is Perrin Maltese, MD  Consult requested in the Hospital by Lisa Roca, MD, On 02/09/2016    Reason for consult ; preop consult for medical clearance. n for   Chief Complaint; left hip pain   Patient presents with  . Hip Pain     HPI  Yvonne Singleton  is a 80 y.o. female, with history of hypertension, rheumatoid arthritis came in because of left hip pain,found to have left hip dislocation. Patient was in the process of standing and reaching across her up to get something and she noticed a pop in the left hip. Patient had history of left hip dislocation about 4 times in the last 4 years. In place of left hip pain at this time. We are asked  to clear the patient medically for her to go to  OR  For left hip relocation. He has no history of chest pain or coronary artery disease. No history of COPD. In her usual state of health. She had left hip arthroplasty, previous closed reduction of dislocated left the hip, was followed up by Dr.Hooten. Uses a 2 crutches for ambulation.    Past Medical History  Diagnosis Date  . Gout   . Parkinson's disease ([place])   . Back pain   . Hypertension   . Hard of hearing   . Sleep apnea   . Cancer [facility]) 2007    right breast  . Breast cancer [facility]) 2007    right breast lumpectomy with rad tx      Past Surgical History  Procedure Laterality Date  . Hip surgery    . Eye surgery Bilateral   . Cataract extraction Bilateral   . Joint replacement      bilateral hip  . Total knee arthroplasty Bilateral   . Abdominal hysterectomy    . Hand surgery    . Hip closed reduction Left 06/24/2015    Procedure: CLOSED REDUCTION HIP;  Surgeon:  Dereck Leep, MD;  Location: [facility] ORS;  Service: Orthopedics;  Laterality: Left;       Review of Systems    She has left hip pain. Unable  to move the left hip. No Fever-chills, No Headache, No changes with Vision or hearing, No problems swallowing food or Liquids, No Chest pain, Cough or Shortness of Breath, No Abdominal pain, No Nausea or Vommitting, Bowel movements are regular, No Blood in stool or Urine, No dysuria, No new skin rashes or bruises, No new joints pains-aches,  No new weakness, tingling, numbness in any extremity, No recent weight gain or loss, No polyuria, polydypsia or polyphagia, No significant Mental Stressors.  A full 10 point Review of Systems was done, except as stated above, all other Review of Systems were negative.   Social History Social History  Substance Use Topics  . Smoking  status: Never Smoker   . Smokeless tobacco: Not on file  . Alcohol Use: No    Family History Family History  Problem Relation Age of Onset  . Heart disease Mother   . Arthritis Mother   . Heart disease Father   . Ulcers Father   . Breast cancer Maternal Aunt      Prior to Admission medications   Medication Sig Start Date End Date Taking? Authorizing Provider  alendronate (FOSAMAX) 70 MG tablet Take 70 mg by mouth once a week. Pt takes on Wednesday.   Take with a full glass of water on an empty stomach.   Yes Historical Provider, MD  allopurinol (ZYLOPRIM) 100 MG tablet Take 200 mg by mouth at bedtime.    Yes Historical Provider, MD  b complex vitamins tablet Take 1 tablet by mouth daily.   Yes Historical Provider, MD  Calcium Carbonate-Vitamin D (CALCIUM 600+D) 600-200 MG-UNIT TABS Take 1 tablet by mouth daily.   Yes Historical Provider, MD  carbidopa-levodopa (SINEMET CR) 50-200 MG per tablet Take 1 tablet by mouth at bedtime.    Yes Historical Provider, MD  cholecalciferol (VITAMIN D) 1000 units tablet Take 2,000 Units by mouth daily.   Yes Historical  Provider, MD  DULoxetine (CYMBALTA) 30 MG capsule Take 30 mg by mouth daily.   Yes Historical Provider, MD  furosemide (LASIX) 20 MG tablet Take 20 mg by mouth daily.    Yes Historical Provider, MD  gabapentin (NEURONTIN) 100 MG capsule Take 100 mg by mouth at bedtime.   Yes Historical Provider, MD  hydroxychloroquine (PLAQUENIL) 200 MG tablet Take 400 mg by mouth daily.    Yes Historical Provider, MD  losartan (COZAAR) 100 MG tablet Take 100 mg by mouth daily.    Yes Historical Provider, MD  oxyCODONE (OXY IR/ROXICODONE) 5 MG immediate release tablet Take 5 mg by mouth 3 (three) times daily as needed for severe pain.   Yes Historical Provider, MD  tolterodine (DETROL LA) 4 MG 24 hr capsule Take 4 mg by mouth daily.   Yes Historical Provider, MD    Anti-infectives    None      Scheduled Meds: Continuous Infusions: PRN Meds:.  Allergies  Allergen Reactions  . Trospium Nausea And Vomiting  . Iodinated Diagnostic Agents Hives  . Streptomycin Hives    Physical Exam  Vitals  Blood pressure 112/89, pulse 78, temperature 98 F (36.7 C), temperature source Oral, resp. rate 18, height 5' 4"  (1.626 m), weight 80.74 kg (178 lb), SpO2 100 %.   1. General , alert, awake, oriented.  2. Normal affect and insight, Not Suicidal or Homicidal, Awake Alert, Oriented X 3.  3. No F.N deficits, ALL C.Nerves Intact, Strength 5/5 all 4 extremities, Sensation intact all 4 extremities, Plantars down going.  4. Ears and Eyes appear Normal, Conjunctivae clear, PERRLA. Moist Oral Mucosa.  5. Supple Neck, No JVD, No cervical lymphadenopathy appriciated, No Carotid Bruits.  6. Symmetrical Chest wall movement, Good air movement bilaterally, CTAB.  7. RRR, No Gallops, Rubs or Murmurs, No Parasternal Heave.  8. Positive Bowel Sounds, Abdomen Soft, No tenderness, No organomegaly appriciated,No rebound -guarding or rigidity.  9.  No Cyanosis, Normal Skin Turgor, No Skin Rash or Bruise.  10. Good  muscle tone, hip internally rotated, unable to move the left hip.no  Swelling. No point tenderness. No muscle atrophy. No erythema. 11. No Palpable Lymph Nodes in Neck or Axillae Metabolic   Data Review  CBC  Recent Labs  Lab 02/09/16 1213  WBC 7.3  HGB 13.8  HCT 40.9  PLT 219  MCV 96.5  MCH 32.5  MCHC 33.7  RDW 13.8  LYMPHSABS 1.4  MONOABS 0.4  EOSABS 0.2  BASOSABS 0.0   ------------------------------------------------------------------------------------------------------------------  Chemistries   Recent Labs Lab 02/09/16 1213  NA 139  K 4.9  CL 106  CO2 26  GLUCOSE 84  BUN 33*  CREATININE 0.80  CALCIUM 9.8   ------------------------------------------------------------------------------------------------------------------ estimated creatinine clearance is 59.6 mL/min (by C-G formula based on Cr of 0.8). ------------------------------------------------------------------------------------------------------------------ No results for input(s): TSH, T4TOTAL, T3FREE, THYROIDAB in the last 72 hours.  Invalid input(s): FREET3   Coagulation profile No results for input(s): INR, PROTIME in the last 168 hours. ------------------------------------------------------------------------------------------------------------------- No results for input(s): DDIMER in the last 72 hours. -------------------------------------------------------------------------------------------------------------------  Cardiac Enzymes No results for input(s): CKMB, TROPONINI, MYOGLOBIN in the last 168 hours.  Invalid input(s): CK ------------------------------------------------------------------------------------------------------------------ Invalid input(s): POCBNP   ---------------------------------------------------------------------------------------------------------------  Urinalysis    Component Value Date/Time   COLORURINE STRAW* 06/24/2015 1331   COLORURINE Straw 09/30/2014  1508   APPEARANCEUR CLEAR* 06/24/2015 1331   APPEARANCEUR Clear 09/30/2014 1508   LABSPEC 1.008 06/24/2015 1331   LABSPEC 1.006 09/30/2014 1508   PHURINE 6.0 06/24/2015 1331   PHURINE 5.0 09/30/2014 1508   GLUCOSEU NEGATIVE 06/24/2015 1331   GLUCOSEU Negative 09/30/2014 1508   HGBUR 1+* 06/24/2015 1331   HGBUR 3+ 09/30/2014 1508   BILIRUBINUR NEGATIVE 06/24/2015 1331   BILIRUBINUR Negative 09/30/2014 1508   KETONESUR NEGATIVE 06/24/2015 1331   KETONESUR Negative 09/30/2014 1508   PROTEINUR NEGATIVE 06/24/2015 1331   PROTEINUR Negative 09/30/2014 1508   NITRITE NEGATIVE 06/24/2015 1331   NITRITE Negative 09/30/2014 1508   LEUKOCYTESUR NEGATIVE 06/24/2015 1331   LEUKOCYTESUR 2+ 09/30/2014 1508     Imaging results:   Dg Chest Port 1 View  02/09/2016  CLINICAL DATA:  Preop for left hip reduction EXAM: PORTABLE CHEST 1 VIEW COMPARISON:  06/24/2015 FINDINGS: Cardiomediastinal silhouette is stable. Probable chronic mild interstitial prominence again noted without convincing pulmonary edema. Mild basilar atelectasis without segmental infiltrate. IMPRESSION: Probable chronic mild interstitial prominence again noted without convincing pulmonary edema. Mild basilar atelectasis without segmental infiltrate. Electronically Signed   By: Lahoma Crocker M.D.   On: 02/09/2016 16:08   Dg Hip Port Unilat With Pelvis 1v Left  02/09/2016  CLINICAL DATA:  Patient with hip reduction. Evaluate for re- location. EXAM: DG HIP (WITH OR WITHOUT PELVIS) 1V PORT LEFT COMPARISON:  Pelvic radiograph 02/09/2016 FINDINGS: Persistent dislocation of the left hip arthroplasty. No definite fracture is identified. Osseous demineralization. IMPRESSION: Persistent left hip arthroplasty dislocation. Electronically Signed   By: Lovey Newcomer M.D.   On: 02/09/2016 15:58   Dg Hip Unilat With Pelvis 2-3 Views Left  02/09/2016  CLINICAL DATA:  Left hip pain EXAM: DG HIP (WITH OR WITHOUT PELVIS) 2-3V LEFT COMPARISON:  06/24/2015  FINDINGS: There is superior dislocation of the left femoral head prosthesis with respect to its acetabular cup. No fracture. Right total hip arthroplasty is anatomically aligned. Degenerative changes in the lumbar spine. Osteopenia. IMPRESSION: Left total hip arthroplasty is dislocated. Electronically Signed   By: Marybelle Killings M.D.   On: 02/09/2016 11:44    EKG showed sinus rhythm 61 bpm no ST-T changes.    Assessment & Plan  Active Problems:   * No active hospital problems. *  1 recurrent dislocation of, left total hip arthroplasty: Patient needs operative closed  reduction of dislocated hip by ortho in  the OR. Patient is at low risk for surgery. #2 rheumatoid arthritis: Patient takes   Plaquinel. #3 hypertension: Controlled continue  Cozaar. history of Parkinson disease; she and is on Sinemet continue that.  #4. History of overactive bladder: She is  On Myrbetreq  Time spent ;40 minutes Reviewed the charts from care everywhere in [place].  Family Communication: Plan discussed with patient   Thank you for the consult, we will follow the patient with you in the Hospital.   [facility] M.D on 02/09/2016 at 4:21 PM  Note: This dictation was prepared with Dragon dictation along with smaller phrase technology. Any transcriptional errors that result from this process are unintentional.

## 2016-02-09 NOTE — H&P (Signed)
Yvonne Singleton is an 81 y.o. female.    Chief Complaint: Left hip pain  HPI: Yvonne Singleton is a 81 year old female with multiple left total hip dislocations in the past.  She was sitting on the bed today and leaned over and the hip dislocated again.  She was brought to the emergency room where exam and x-rays revealed a left posterior total hip dislocation.  Attempts by the ER staff to reduce this under sedation were unsuccessful.  Patient is being seen by orthopedics to take her to the operating room for reduction under anesthesia.  Patient and her family are present for discussion and are well aware of the procedure and risks were discussed as well.  She has gone home from the hospital the same day in the past and we will plan to do that again.  She is using a knee immobilizer for protection in the past as well.  Past Medical History  Diagnosis Date  . Gout   . Parkinson's disease ([place])   . Back pain   . Hypertension   . Hard of hearing   . Sleep apnea   . Cancer [facility]) 2007    right breast  . Breast cancer [facility]) 2007    right breast lumpectomy with rad tx    Past Surgical History  Procedure Laterality Date  . Hip surgery    . Eye surgery Bilateral   . Cataract extraction Bilateral   . Joint replacement      bilateral hip  . Total knee arthroplasty Bilateral   . Abdominal hysterectomy    . Hand surgery    . Hip closed reduction Left 06/24/2015    Procedure: CLOSED REDUCTION HIP;  Surgeon: Dereck Leep, MD;  Location: [facility] ORS;  Service: Orthopedics;  Laterality: Left;    Family History  Problem Relation Age of Onset  . Heart disease Mother   . Arthritis Mother   . Heart disease Father   . Ulcers Father   . Breast cancer Maternal Aunt    Social History:  reports that she has never smoked. She does not have any smokeless tobacco history on file. She reports that she does not drink alcohol or use illicit drugs.  Allergies:  Allergies  Allergen Reactions  . Trospium  Nausea And Vomiting  . Iodinated Diagnostic Agents Hives  . Streptomycin Hives     (Not in a hospital admission)  Results for orders placed or performed during the hospital encounter of 02/09/16 (from the past 48 hour(s))  CBC with Differential     Status: None   Collection Time: 02/09/16 12:13 PM  Result Value Ref Range   WBC 7.3 3.6 - 11.0 K/uL   RBC 4.24 3.80 - 5.20 MIL/uL   Hemoglobin 13.8 12.0 - 16.0 g/dL   HCT 40.9 35.0 - 47.0 %   MCV 96.5 80.0 - 100.0 fL   MCH 32.5 26.0 - 34.0 pg   MCHC 33.7 32.0 - 36.0 g/dL   RDW 13.8 11.5 - 14.5 %   Platelets 219 150 - 440 K/uL   Neutrophils Relative % 72 %   Neutro Abs 5.3 1.4 - 6.5 K/uL   Lymphocytes Relative 19 %   Lymphs Abs 1.4 1.0 - 3.6 K/uL   Monocytes Relative 6 %   Monocytes Absolute 0.4 0.2 - 0.9 K/uL   Eosinophils Relative 3 %   Eosinophils Absolute 0.2 0 - 0.7 K/uL   Basophils Relative 0 %   Basophils Absolute 0.0 0 - 0.1  K/uL  Basic metabolic panel     Status: Abnormal   Collection Time: 02/09/16 12:13 PM  Result Value Ref Range   Sodium 139 135 - 145 mmol/L   Potassium 4.9 3.5 - 5.1 mmol/L   Chloride 106 101 - 111 mmol/L   CO2 26 22 - 32 mmol/L   Glucose, Bld 84 65 - 99 mg/dL   BUN 33 (H) 6 - 20 mg/dL   Creatinine, Ser 0.80 0.44 - 1.00 mg/dL   Calcium 9.8 8.9 - 10.3 mg/dL   GFR calc non Af Amer >60 >60 mL/min   GFR calc Af Amer >60 >60 mL/min    Comment: (NOTE) The eGFR has been calculated using the CKD EPI equation. This calculation has not been validated in all clinical situations. eGFR's persistently <60 mL/min signify possible Chronic Kidney Disease.    Anion gap 7 5 - 15   Dg Chest Port 1 View  02/09/2016  CLINICAL DATA:  Preop for left hip reduction EXAM: PORTABLE CHEST 1 VIEW COMPARISON:  06/24/2015 FINDINGS: Cardiomediastinal silhouette is stable. Probable chronic mild interstitial prominence again noted without convincing pulmonary edema. Mild basilar atelectasis without segmental infiltrate.  IMPRESSION: Probable chronic mild interstitial prominence again noted without convincing pulmonary edema. Mild basilar atelectasis without segmental infiltrate. Electronically Signed   By: Lahoma Crocker M.D.   On: 02/09/2016 16:08   Dg Hip Port Unilat With Pelvis 1v Left  02/09/2016  CLINICAL DATA:  Patient with hip reduction. Evaluate for re- location. EXAM: DG HIP (WITH OR WITHOUT PELVIS) 1V PORT LEFT COMPARISON:  Pelvic radiograph 02/09/2016 FINDINGS: Persistent dislocation of the left hip arthroplasty. No definite fracture is identified. Osseous demineralization. IMPRESSION: Persistent left hip arthroplasty dislocation. Electronically Signed   By: Lovey Newcomer M.D.   On: 02/09/2016 15:58   Dg Hip Unilat With Pelvis 2-3 Views Left  02/09/2016  CLINICAL DATA:  Left hip pain EXAM: DG HIP (WITH OR WITHOUT PELVIS) 2-3V LEFT COMPARISON:  06/24/2015 FINDINGS: There is superior dislocation of the left femoral head prosthesis with respect to its acetabular cup. No fracture. Right total hip arthroplasty is anatomically aligned. Degenerative changes in the lumbar spine. Osteopenia. IMPRESSION: Left total hip arthroplasty is dislocated. Electronically Signed   By: Marybelle Killings M.D.   On: 02/09/2016 11:44    Review of Systems  Constitutional: Negative.   HENT: Negative.   Eyes: Negative.   Respiratory: Negative.   Cardiovascular: Negative.   Gastrointestinal: Negative.   Musculoskeletal: Negative.   Skin: Negative.     Blood pressure 119/62, pulse 62, temperature 98 F (36.7 C), temperature source Oral, resp. rate 12, height '5\' 4"'  (1.626 m), weight 80.74 kg (178 lb), SpO2 98 %. Physical Exam  Musculoskeletal:       Left hip: She exhibits decreased range of motion, tenderness and deformity.   the left leg is shortened and internally rotated.  Neurovascular status is good distally.  Skin is intact.  Assessment/Plan Posterior dislocation left total hip replacement  Park Breed, MD 02/09/2016, 6:02  PM

## 2016-02-09 NOTE — Anesthesia Procedure Notes (Signed)
Date/Time: 02/09/2016 5:43 PM Performed by: Lendon Colonel Pre-anesthesia Checklist: Patient identified, Emergency Drugs available, Suction available, Patient being monitored and Timeout performed Patient Re-evaluated:Patient Re-evaluated prior to inductionOxygen Delivery Method: Circle system utilized Preoxygenation: Pre-oxygenation with 100% oxygen Intubation Type: IV induction Ventilation: Mask ventilation without difficulty and Mask ventilation throughout procedure

## 2016-02-09 NOTE — Anesthesia Preprocedure Evaluation (Addendum)
Anesthesia Evaluation  Patient identified by MRN, date of birth, ID band Patient awake    Reviewed: Allergy & Precautions, NPO status , Patient's Chart, lab work & pertinent test results, reviewed documented beta blocker date and time   Airway Mallampati: II  TM Distance: >3 FB     Dental  (+) Chipped, Upper Dentures, Missing   Pulmonary sleep apnea ,           Cardiovascular hypertension, Pt. on medications      Neuro/Psych    GI/Hepatic   Endo/Other    Renal/GU      Musculoskeletal  (+) Arthritis , Rheumatoid disorders,    Abdominal   Peds  Hematology   Anesthesia Other Findings Decreased hearing. Parkinsons. Gout. Walks on 2 crutches.   Reproductive/Obstetrics                            Anesthesia Physical Anesthesia Plan  ASA: III  Anesthesia Plan: General   Post-op Pain Management:    Induction: Intravenous  Airway Management Planned:   Additional Equipment:   Intra-op Plan:   Post-operative Plan:   Informed Consent: I have reviewed the patients History and Physical, chart, labs and discussed the procedure including the risks, benefits and alternatives for the proposed anesthesia with the patient or authorized representative who has indicated his/her understanding and acceptance.     Plan Discussed with: CRNA  Anesthesia Plan Comments:         Anesthesia Quick Evaluation

## 2016-02-10 ENCOUNTER — Encounter: Payer: Self-pay | Admitting: Specialist

## 2016-02-10 NOTE — Anesthesia Postprocedure Evaluation (Signed)
Anesthesia Post Note  Patient: Yvonne Singleton  Procedure(s) Performed: Procedure(s) (LRB): CLOSED MANIPULATION HIP (Left)  Patient location during evaluation: PACU Anesthesia Type: General Level of consciousness: awake and alert Pain management: pain level controlled Vital Signs Assessment: post-procedure vital signs reviewed and stable Respiratory status: spontaneous breathing, nonlabored ventilation, respiratory function stable and patient connected to nasal cannula oxygen Cardiovascular status: blood pressure returned to baseline and stable Postop Assessment: no signs of nausea or vomiting Anesthetic complications: no    Last Vitals:  Filed Vitals:   02/09/16 2015 02/09/16 2020  BP:  150/58  Pulse: 106 70  Temp:  36.7 C  Resp:      Last Pain:  Filed Vitals:   02/09/16 2041  PainSc: [place]

## 2016-02-24 ENCOUNTER — Ambulatory Visit: Payer: Medicare Other | Attending: Pain Medicine | Admitting: Pain Medicine

## 2016-02-24 ENCOUNTER — Encounter: Payer: Self-pay | Admitting: Pain Medicine

## 2016-02-24 VITALS — BP 128/63 | HR 63 | Temp 98.1°F | Resp 18 | Ht 64.0 in | Wt 178.0 lb

## 2016-02-24 DIAGNOSIS — Z96643 Presence of artificial hip joint, bilateral: Secondary | ICD-10-CM | POA: Insufficient documentation

## 2016-02-24 DIAGNOSIS — M5030 Other cervical disc degeneration, unspecified cervical region: Secondary | ICD-10-CM

## 2016-02-24 DIAGNOSIS — M5136 Other intervertebral disc degeneration, lumbar region: Secondary | ICD-10-CM

## 2016-02-24 DIAGNOSIS — M533 Sacrococcygeal disorders, not elsewhere classified: Secondary | ICD-10-CM | POA: Insufficient documentation

## 2016-02-24 DIAGNOSIS — M62830 Muscle spasm of back: Secondary | ICD-10-CM | POA: Insufficient documentation

## 2016-02-24 DIAGNOSIS — M961 Postlaminectomy syndrome, not elsewhere classified: Secondary | ICD-10-CM

## 2016-02-24 DIAGNOSIS — Z9889 Other specified postprocedural states: Secondary | ICD-10-CM | POA: Diagnosis not present

## 2016-02-24 DIAGNOSIS — M47816 Spondylosis without myelopathy or radiculopathy, lumbar region: Secondary | ICD-10-CM | POA: Diagnosis not present

## 2016-02-24 DIAGNOSIS — M4806 Spinal stenosis, lumbar region: Secondary | ICD-10-CM | POA: Diagnosis not present

## 2016-02-24 DIAGNOSIS — M19012 Primary osteoarthritis, left shoulder: Secondary | ICD-10-CM | POA: Insufficient documentation

## 2016-02-24 DIAGNOSIS — X58XXXA Exposure to other specified factors, initial encounter: Secondary | ICD-10-CM | POA: Diagnosis not present

## 2016-02-24 DIAGNOSIS — M5126 Other intervertebral disc displacement, lumbar region: Secondary | ICD-10-CM | POA: Insufficient documentation

## 2016-02-24 DIAGNOSIS — M48062 Spinal stenosis, lumbar region with neurogenic claudication: Secondary | ICD-10-CM

## 2016-02-24 DIAGNOSIS — Y792 Prosthetic and other implants, materials and accessory orthopedic devices associated with adverse incidents: Secondary | ICD-10-CM | POA: Diagnosis not present

## 2016-02-24 DIAGNOSIS — M25511 Pain in right shoulder: Secondary | ICD-10-CM | POA: Diagnosis present

## 2016-02-24 DIAGNOSIS — T84021A Dislocation of internal left hip prosthesis, initial encounter: Secondary | ICD-10-CM | POA: Insufficient documentation

## 2016-02-24 DIAGNOSIS — M7060 Trochanteric bursitis, unspecified hip: Secondary | ICD-10-CM | POA: Diagnosis not present

## 2016-02-24 DIAGNOSIS — M25512 Pain in left shoulder: Secondary | ICD-10-CM | POA: Diagnosis present

## 2016-02-24 DIAGNOSIS — M7061 Trochanteric bursitis, right hip: Secondary | ICD-10-CM

## 2016-02-24 DIAGNOSIS — M19011 Primary osteoarthritis, right shoulder: Secondary | ICD-10-CM | POA: Diagnosis not present

## 2016-02-24 MED ORDER — OXYCODONE HCL 5 MG PO TABS: ORAL_TABLET | ORAL | Status: DC

## 2016-02-24 MED ORDER — GABAPENTIN 100 MG PO CAPS: ORAL_CAPSULE | ORAL | Status: DC

## 2016-02-24 NOTE — Progress Notes (Signed)
Subjective:    Patient ID: Yvonne Singleton, female    DOB: 31-Aug-1935, 81 y.o.   MRN: [id number]  HPI  The patient is a 81 year old female who returns to pain management for further evaluation and treatment of pain which is involving the shoulders upper extremities entire back lower back and lower extremity regions. The patient is status post surgery of the lumbar region as well as total hip replacement. Patient stated that her left hip came out of place and began. The patient will follow-up with Dr. Marry Guan in this regard. The patient continues Neurontin Cymbalta and oxycodone without any undesirable side effects. The patient is noted some tingling of the lower extremities and we have asked the patient to speak with Wolfgang Phoenix regarding her Cymbalta dose. The patient may benefit from increasing Cymbalta from 30 mg per day to 40 mg per day while she increases Neurontin by one more pill per day if tolerated without undesirable side effects. We will consider additional modifications of treatment regimen should the paresthesias of the lower extremity continue without modifying Neurontin and Cymbalta or if Neurontin and Cymbalta cannot be modified without undesirable side effects. We will also consider interventional treatment should patient have symptoms that persist despite noninterventional treatment measures. The patient will follow-up with Dr. Marry Guan for disks located left hip replacement as planned. All agreed to suggested treatment plan  Review of Systems     Objective:   Physical Exam  There was tenderness to palpation of the splenius capitis and occipitalis musculature region of mild degree with mild tenderness over the cervical facet cervical paraspinal musculature region. Palpation over the thoracic facet thoracic paraspinal musculature region was attends to palpation of moderate degree in the lower thoracic region with moderate muscle spasms. No crepitus of the thoracic region was noted.  Palpation of the acromioclavicular and glenohumeral joint regions reproduced pain of moderate degree with limited range of motion of the shoulders and with decreased grip strength of the upper extremities with Tinel and Phalen's maneuver meant causing mild to moderate discomfort. There were well-healed surgical scars of the upper extremities without increased warmth and erythema with ecchymosis of the upper extremities noted. Palpation of the thoracic paraspinal musculature region was with muscle spasm of the thoracic paraspinal musculature region and palpation over the lumbar paraspinal muscular region lumbar facet region was with moderate tenderness to palpation as well as palpation over the PSIS and PII S region. There was tenderness along the iliotibial band region with EHL strength slightly decreased with negative clonus negative Homans. No sensory deficit or dermatomal distribution detected. Abdomen nontender with no costovertebral tenderness noted      Assessment & Plan:    Dislocated left total hip replacement  Greater trochanteric bursitis  Degenerative changes lumbar spine L2-3, L3-4 degenerative changes with postoperative changes noted bilaterally, multilevel degenerative changes noted throughout the lumbar spine with neural foraminal narrowing and disc herniations as well  Lumbar stenosis with neurogenic claudication  Lumbar facet syndrome  Sacroiliac joint dysfunction  Status post total hip replacements   DJD of shoulders  Status post surgery of upper extremities      PLAN   Continue present medication Neurontin and oxycodone Continue vitamin B complex and Cymbalta as well.. You may take one additional Neurontin per day if you do not experience drowsiness confusion excessive sedation or swelling  F/U PCP Dr.N Humphrey Rolls  for evaliation of  BP and general medical condition as discussed. Also discussed increase of Cymbalta with Dr. Wolfgang Phoenix  F/U surgical evaluation. Follow-up  Dr. Marry Guan as planned for evaluation of right hip as planned  F/U Dr. Margaretmary Eddy as discussed  F/U Dr. Merita Norton as discussed  F/U neurological evaluation. Follow-up with Dr. Manuella Ghazi as discussed  May consider radiofrequency rhizolysis or intraspinal procedures pending response to present treatment and F/U evaluation   Patient to call [facility] should patient have concerns prior to scheduled return appointment

## 2016-02-24 NOTE — Progress Notes (Signed)
Patient here for day surgery to put hip back into place. Was discharged same day.

## 2016-02-24 NOTE — Patient Instructions (Addendum)
   PLAN   Continue present medication Neurontin and oxycodone Continue vitamin B complex and Cymbalta as well.. You may take one additional Neurontin per day if you do not experience drowsiness confusion excessive sedation or swelling  F/U PCP Dr.N Humphrey Rolls  for evaliation of  BP and general medical condition as discussed. Also discussed increase of Cymbalta with Dr. Wolfgang Phoenix  F/U surgical evaluation. Follow-up Dr. Marry Guan as planned for evaluation of right hip as planned  F/U Dr. Margaretmary Eddy as discussed  F/U Dr. Merita Norton as discussed  F/U neurological evaluation. Follow-up with Dr. Manuella Ghazi as discussed  May consider radiofrequency rhizolysis or intraspinal procedures pending response to present treatment and F/U evaluation   Patient to call [facility] should patient have concerns prior to scheduled return appointment

## 2016-03-23 ENCOUNTER — Encounter: Payer: Self-pay | Admitting: Pain Medicine

## 2016-03-23 ENCOUNTER — Ambulatory Visit: Payer: Medicare Other | Attending: Pain Medicine | Admitting: Pain Medicine

## 2016-03-23 VITALS — BP 117/52 | HR 59 | Temp 98.6°F | Resp 15 | Ht 64.0 in | Wt 180.0 lb

## 2016-03-23 DIAGNOSIS — Y792 Prosthetic and other implants, materials and accessory orthopedic devices associated with adverse incidents: Secondary | ICD-10-CM | POA: Insufficient documentation

## 2016-03-23 DIAGNOSIS — M5136 Other intervertebral disc degeneration, lumbar region: Secondary | ICD-10-CM | POA: Diagnosis not present

## 2016-03-23 DIAGNOSIS — M545 Low back pain: Secondary | ICD-10-CM | POA: Diagnosis present

## 2016-03-23 DIAGNOSIS — R202 Paresthesia of skin: Secondary | ICD-10-CM | POA: Insufficient documentation

## 2016-03-23 DIAGNOSIS — M5030 Other cervical disc degeneration, unspecified cervical region: Secondary | ICD-10-CM

## 2016-03-23 DIAGNOSIS — M533 Sacrococcygeal disorders, not elsewhere classified: Secondary | ICD-10-CM | POA: Diagnosis not present

## 2016-03-23 DIAGNOSIS — Z9889 Other specified postprocedural states: Secondary | ICD-10-CM | POA: Insufficient documentation

## 2016-03-23 DIAGNOSIS — M62830 Muscle spasm of back: Secondary | ICD-10-CM | POA: Insufficient documentation

## 2016-03-23 DIAGNOSIS — M7060 Trochanteric bursitis, unspecified hip: Secondary | ICD-10-CM | POA: Insufficient documentation

## 2016-03-23 DIAGNOSIS — M5126 Other intervertebral disc displacement, lumbar region: Secondary | ICD-10-CM | POA: Diagnosis not present

## 2016-03-23 DIAGNOSIS — M19012 Primary osteoarthritis, left shoulder: Secondary | ICD-10-CM | POA: Insufficient documentation

## 2016-03-23 DIAGNOSIS — M47816 Spondylosis without myelopathy or radiculopathy, lumbar region: Secondary | ICD-10-CM | POA: Insufficient documentation

## 2016-03-23 DIAGNOSIS — Z96643 Presence of artificial hip joint, bilateral: Secondary | ICD-10-CM | POA: Diagnosis not present

## 2016-03-23 DIAGNOSIS — M7061 Trochanteric bursitis, right hip: Secondary | ICD-10-CM

## 2016-03-23 DIAGNOSIS — M19011 Primary osteoarthritis, right shoulder: Secondary | ICD-10-CM | POA: Diagnosis not present

## 2016-03-23 DIAGNOSIS — M48062 Spinal stenosis, lumbar region with neurogenic claudication: Secondary | ICD-10-CM

## 2016-03-23 DIAGNOSIS — M4806 Spinal stenosis, lumbar region: Secondary | ICD-10-CM | POA: Diagnosis not present

## 2016-03-23 DIAGNOSIS — T84021A Dislocation of internal left hip prosthesis, initial encounter: Secondary | ICD-10-CM | POA: Diagnosis not present

## 2016-03-23 DIAGNOSIS — M961 Postlaminectomy syndrome, not elsewhere classified: Secondary | ICD-10-CM

## 2016-03-23 MED ORDER — OXYCODONE HCL 5 MG PO TABS: ORAL_TABLET | ORAL | Status: DC

## 2016-03-23 MED ORDER — GABAPENTIN 100 MG PO CAPS: ORAL_CAPSULE | ORAL | Status: DC

## 2016-03-23 NOTE — Progress Notes (Signed)
Subjective:    Patient ID: Yvonne Singleton, female    DOB: 04-May-1935, 81 y.o.   MRN: [id number]  HPI  The patient is a 81 year old female who returns to pain management for further evaluation and treatment of pain involving the lower back lower extremity region predominantly with history of pain which is involving the shoulders and upper extremity regions as well. The patient is status post prior surgical intervention of the lumbar region as well as total hip replacement. The patient will follow-up with Dr. Marry Guan at this time for dislocated left hip. The patient has had paresthesias of the left lower extremity which has been felt to be due to dislocated hip as well as concern regarding intraspinal abnormalities of the lumbar region for treatment of patient's symptomatology. We discussed patient's condition and we will await follow-up evaluation with Dr. Marry Guan for further assessment of patient's pain of the lumbar lower extremity region especially the left hip and will consider patient for interventional treatment consisting of lumbar epidural steroid injection of the procedure pending surgical disposition of Dr. Marry Guan and decision patient. The patient denied any recent trauma change in events of daily living the cost change in symptomatology. All were understanding and agreement suggested treatment plan. At the present time patient continues Neurontin oxycodone and Cymbalta. We will remain available to consider additional medication treatment pending response to treatment and follow-up evaluation. All agreed with suggested treatment plan     Review of Systems     Objective:   Physical Exam  There was minimal tenderness of the splenius capitis and occipitalis regions with palpation of the cervical facet cervical paraspinal musculature region reproducing minimal discomfort. There was minimal tenderness over the thoracic facet thoracic paraspinal musculature region with evidence of  moderate muscle spasm of the lower thoracic paraspinal musculature region without crepitus of the thoracic region noted. There was moderate tenderness to palpation of the acromioclavicular and glenohumeral joint region with limited range of motion of the shoulders noted. There were well-healed scars of the upper extremities with Tinel and Phalen's maneuver reproducing mild to moderate discomfort with decreased grip strength noted. Palpation over the lumbar paraspinal musculatures and lumbar facet region was attends to palpation of moderate degree with lateral bending rotation extension and palpation of the lumbar facets reproducing moderate discomfort with moderate tenderness along the PSIS and PII S region. Straight leg raising was tolerates approximately 20 there was decreased EHL strength There was question decreased sensation of the L5 dermatomal distribution. The knees were attends to palpation with negative anterior and posterior drawer signs the abdomen was nontender with no costovertebral tenderness noted. The abdomen was nontender with no costovertebral tenderness noted      Assessment & Plan:     Lumbar stenosis with neurogenic claudication  Dislocated left total hip replacement  Greater trochanteric bursitis  Degenerative changes lumbar spine L2-3, L3-4 degenerative changes with postoperative changes noted bilaterally, multilevel degenerative changes noted throughout the lumbar spine with neural foraminal narrowing and disc herniations as well  Lumbar facet syndrome  Sacroiliac joint dysfunction  Status post total hip replacements   DJD of shoulders  Status post surgery of upper extremities        PLAN   Continue present medication Neurontin and oxycodone Continue vitamin B complex and Cymbalta as well  Patient will call after being evaluated by Dr. Marry Guan to let us know if she wishes to proceed with lumbar epidural steroid injection.  F/U PCP Dr.N Humphrey Rolls  for  evaliation of  BP and general medical condition as discussed.   F/U surgical evaluation. Follow-up Dr. Marry Guan as planned for evaluation of left hip as planned area there is concern regarding dislocated left hip as well as intraspinal abnormalities of the lumbar region which may be contributing to patient's lower extremity symptoms.  F/U Dr. Merita Norton as discussed  F/U neurological evaluation. Follow-up with Dr. Manuella Ghazi as discussed  May consider radiofrequency rhizolysis or intraspinal procedures pending response to present treatment and F/U evaluation   Patient to call [facility] Center should patient have concerns prior to scheduled return appointment

## 2016-03-23 NOTE — Patient Instructions (Addendum)
PLAN   Continue present medication Neurontin and oxycodone Continue vitamin B complex and Cymbalta as well  Patient will call after being evaluated by Dr. Marry Guan to let us know if she wishes to proceed with lumbar epidural steroid injection.  F/U PCP Dr.N Humphrey Rolls  for evaliation of  BP and general medical condition as discussed.   F/U surgical evaluation. Follow-up Dr. Marry Guan as planned for evaluation of right hip as planned  F/U Dr. Merita Norton as discussed  F/U neurological evaluation. Follow-up with Dr. Manuella Ghazi as discussed  May consider radiofrequency rhizolysis or intraspinal procedures pending response to present treatment and F/U evaluation   Patient to call [facility] should patient have concerns prior to scheduled return appointmentEpidural Steroid Injection Patient Information  Description: The epidural space surrounds the nerves as they exit the spinal cord.  In some patients, the nerves can be compressed and inflamed by a bulging disc or a tight spinal canal (spinal stenosis).  By injecting steroids into the epidural space, we can bring irritated nerves into direct contact with a potentially helpful medication.  These steroids act directly on the irritated nerves and can reduce swelling and inflammation which often leads to decreased pain.  Epidural steroids may be injected anywhere along the spine and from the neck to the low back depending upon the location of your pain.   After numbing the skin with local anesthetic (like Novocaine), a small needle is passed into the epidural space slowly.  You may experience a sensation of pressure while this is being done.  The entire block usually last less than 10 minutes.  Conditions which may be treated by epidural steroids:   Low back and leg pain  Neck and arm pain  Spinal stenosis  Post-laminectomy syndrome  Herpes zoster (shingles) pain  Pain from compression fractures  Preparation for the injection:  1. Do  not eat any solid food or dairy products within 8 hours of your appointment.  2. You may drink clear liquids up to 3 hours before appointment.  Clear liquids include water, black coffee, juice or soda.  No milk or cream please. 3. You may take your regular medication, including pain medications, with a sip of water before your appointment  Diabetics should hold regular insulin (if taken separately) and take 1/2 normal NPH dos the morning of the procedure.  Carry some sugar containing items with you to your appointment. 4. A driver must accompany you and be prepared to drive you home after your procedure.  5. Bring all your current medications with your. 6. An IV may be inserted and sedation may be given at the discretion of the physician.   7. A blood pressure cuff, EKG and other monitors will often be applied during the procedure.  Some patients may need to have extra oxygen administered for a short period. 8. You will be asked to provide medical information, including your allergies, prior to the procedure.  We must know immediately if you are taking blood thinners (like Coumadin/Warfarin)  Or if you are allergic to IV iodine contrast (dye). We must know if you could possible be pregnant.  Possible side-effects:  Bleeding from needle site  Infection (rare, may require surgery)  Nerve injury (rare)  Numbness & tingling (temporary)  Difficulty urinating (rare, temporary)  Spinal headache ( a headache worse with upright posture)  Light -headedness (temporary)  Pain at injection site (several days)  Decreased blood pressure (temporary)  Weakness in arm/leg (temporary)  Pressure sensation  in back/neck (temporary)  Call if you experience:  Fever/chills associated with headache or increased back/neck pain.  Headache worsened by an upright position.  New onset weakness or numbness of an extremity below the injection site  Hives or difficulty breathing (go to the emergency  room)  Inflammation or drainage at the infection site  Severe back/neck pain  Any new symptoms which are concerning to you  Please note:  Although the local anesthetic injected can often make your back or neck feel good for several hours after the injection, the pain will likely return.  It takes 3-7 days for steroids to work in the epidural space.  You may not notice any pain relief for at least that one week.  If effective, we will often do a series of three injections spaced 3-6 weeks apart to maximally decrease your pain.  After the initial series, we generally will wait several months before considering a repeat injection of the same type.  If you have any questions, please call [phone] [facility]

## 2016-03-23 NOTE — Progress Notes (Signed)
Safety precautions to be maintained throughout the outpatient stay will include: orient to surroundings, keep bed in low position, maintain call bell within reach at all times, provide assistance with transfer out of bed and ambulation.  

## 2016-03-31 ENCOUNTER — Telehealth: Payer: Self-pay

## 2016-03-31 NOTE — Telephone Encounter (Signed)
Spoke with Patient, states that she has spoken with Dr Marry Guan re; having epidural and he said that it would be okay to proceed with procedure.  Patient denies taking any blood thinners, she does take BP medicine and she was instructed to take that prior the appointment.  Also, she will need a driver and to not eat anything 8 hours prior to the appt. Patient verbalizes u/o information. Transferred to front desk to schedule appt.

## 2016-03-31 NOTE — Telephone Encounter (Signed)
Pt wants nurses and Dr Primus Bravo to know it is okay for her to have procedure Dr Marry Guan gave her the okay

## 2016-04-22 ENCOUNTER — Ambulatory Visit: Payer: Medicare Other | Admitting: Pain Medicine

## 2016-04-23 ENCOUNTER — Encounter: Payer: Self-pay | Admitting: Pain Medicine

## 2016-04-23 ENCOUNTER — Ambulatory Visit: Payer: Medicare Other | Attending: Pain Medicine | Admitting: Pain Medicine

## 2016-04-23 VITALS — BP 126/58 | HR 57 | Temp 97.8°F | Resp 18 | Ht 63.0 in | Wt 182.0 lb

## 2016-04-23 DIAGNOSIS — M5030 Other cervical disc degeneration, unspecified cervical region: Secondary | ICD-10-CM

## 2016-04-23 DIAGNOSIS — M47816 Spondylosis without myelopathy or radiculopathy, lumbar region: Secondary | ICD-10-CM | POA: Insufficient documentation

## 2016-04-23 DIAGNOSIS — Y792 Prosthetic and other implants, materials and accessory orthopedic devices associated with adverse incidents: Secondary | ICD-10-CM | POA: Diagnosis not present

## 2016-04-23 DIAGNOSIS — M19011 Primary osteoarthritis, right shoulder: Secondary | ICD-10-CM

## 2016-04-23 DIAGNOSIS — M961 Postlaminectomy syndrome, not elsewhere classified: Secondary | ICD-10-CM

## 2016-04-23 DIAGNOSIS — T84021A Dislocation of internal left hip prosthesis, initial encounter: Secondary | ICD-10-CM | POA: Insufficient documentation

## 2016-04-23 DIAGNOSIS — M5126 Other intervertebral disc displacement, lumbar region: Secondary | ICD-10-CM | POA: Diagnosis not present

## 2016-04-23 DIAGNOSIS — M48062 Spinal stenosis, lumbar region with neurogenic claudication: Secondary | ICD-10-CM

## 2016-04-23 DIAGNOSIS — M533 Sacrococcygeal disorders, not elsewhere classified: Secondary | ICD-10-CM | POA: Diagnosis not present

## 2016-04-23 DIAGNOSIS — M545 Low back pain: Secondary | ICD-10-CM | POA: Diagnosis present

## 2016-04-23 DIAGNOSIS — M7060 Trochanteric bursitis, unspecified hip: Secondary | ICD-10-CM | POA: Insufficient documentation

## 2016-04-23 DIAGNOSIS — M4806 Spinal stenosis, lumbar region: Secondary | ICD-10-CM | POA: Insufficient documentation

## 2016-04-23 DIAGNOSIS — M19012 Primary osteoarthritis, left shoulder: Secondary | ICD-10-CM | POA: Insufficient documentation

## 2016-04-23 DIAGNOSIS — M51369 Other intervertebral disc degeneration, lumbar region without mention of lumbar back pain or lower extremity pain: Secondary | ICD-10-CM

## 2016-04-23 DIAGNOSIS — M5136 Other intervertebral disc degeneration, lumbar region: Secondary | ICD-10-CM

## 2016-04-23 DIAGNOSIS — Z96643 Presence of artificial hip joint, bilateral: Secondary | ICD-10-CM | POA: Diagnosis not present

## 2016-04-23 DIAGNOSIS — Z9889 Other specified postprocedural states: Secondary | ICD-10-CM

## 2016-04-23 DIAGNOSIS — M7061 Trochanteric bursitis, right hip: Secondary | ICD-10-CM

## 2016-04-23 MED ORDER — OXYCODONE HCL 5 MG PO TABS: ORAL_TABLET | ORAL | Status: DC

## 2016-04-23 MED ORDER — GABAPENTIN 100 MG PO CAPS: ORAL_CAPSULE | ORAL | Status: DC

## 2016-04-23 NOTE — Patient Instructions (Addendum)
PLAN   Continue present medication Neurontin and oxycodone Continue vitamin B complex and Cymbalta as well  Lumbar epidural steroid injection to be performed at time of return appointment  F/U PCP Dr.N Humphrey Rolls  for evaliation of  BP and general medical condition as discussed. . We will request medical clearance from your PCP for lumbar epidural steroid injection  F/U surgical evaluation. Follow-up Dr. Marry Guan as planned for evaluation of right hip as planned  F/U Dr. Merita Norton as discussed  F/U neurological evaluation. Follow-up with Dr. Manuella Ghazi as discussed  May consider radiofrequency rhizolysis or intraspinal procedures pending response to present treatment and F/U evaluation   Patient to call Pain Management Center should patient have concerns prior to scheduled return appointmentEpidural Steroid Injection Patient Information  Description: The epidural space surrounds the nerves as they exit the spinal cord.  In some patients, the nerves can be compressed and inflamed by a bulging disc or a tight spinal canal (spinal stenosis).  By injecting steroids into the epidural space, we can bring irritated nerves into direct contact with a potentially helpful medication.  These steroids act directly on the irritated nerves and can reduce swelling and inflammation which often leads to decreased pain.  Epidural steroids may be injected anywhere along the spine and from the neck to the low back depending upon the location of your pain.   After numbing the skin with local anesthetic (like Novocaine), a small needle is passed into the epidural space slowly.  You may experience a sensation of pressure while this is being done.  The entire block usually last less than 10 minutes.  Conditions which may be treated by epidural steroids:   Low back and leg pain  Neck and arm pain  Spinal stenosis  Post-laminectomy syndrome  Herpes zoster (shingles) pain  Pain from compression  fractures  Preparation for the injection:  1. Do not eat any solid food or dairy products within 8 hours of your appointment.  2. You may drink clear liquids up to 3 hours before appointment.  Clear liquids include water, black coffee, juice or soda.  No milk or cream please. 3. You may take your regular medication, including pain medications, with a sip of water before your appointment  Diabetics should hold regular insulin (if taken separately) and take 1/2 normal NPH dos the morning of the procedure.  Carry some sugar containing items with you to your appointment. 4. A driver must accompany you and be prepared to drive you home after your procedure.  5. Bring all your current medications with your. 6. An IV may be inserted and sedation may be given at the discretion of the physician.   7. A blood pressure cuff, EKG and other monitors will often be applied during the procedure.  Some patients may need to have extra oxygen administered for a short period. 8. You will be asked to provide medical information, including your allergies, prior to the procedure.  We must know immediately if you are taking blood thinners (like Coumadin/Warfarin)  Or if you are allergic to IV iodine contrast (dye). We must know if you could possible be pregnant.  Possible side-effects:  Bleeding from needle site  Infection (rare, may require surgery)  Nerve injury (rare)  Numbness & tingling (temporary)  Difficulty urinating (rare, temporary)  Spinal headache ( a headache worse with upright posture)  Light -headedness (temporary)  Pain at injection site (several days)  Decreased blood pressure (temporary)  Weakness in arm/leg (temporary)  Pressure sensation in back/neck (temporary)  Call if you experience:  Fever/chills associated with headache or increased back/neck pain.  Headache worsened by an upright position.  New onset weakness or numbness of an extremity below the injection site  Hives  or difficulty breathing (go to the emergency room)  Inflammation or drainage at the infection site  Severe back/neck pain  Any new symptoms which are concerning to you  Please note:  Although the local anesthetic injected can often make your back or neck feel good for several hours after the injection, the pain will likely return.  It takes 3-7 days for steroids to work in the epidural space.  You may not notice any pain relief for at least that one week.  If effective, we will often do a series of three injections spaced 3-6 weeks apart to maximally decrease your pain.  After the initial series, we generally will wait several months before considering a repeat injection of the same type.  If you have any questions, please call [phone] [facility]

## 2016-04-23 NOTE — Progress Notes (Signed)
   Subjective:    Patient ID: Yvonne Singleton, female    DOB: [birth year], 81 y.o.   MRN: [id number]  HPI  The patient is a 81 year old female who returns to pain management for further evaluation and treatment of pain involving the lumbar lower extremity region. The patient is undergone reevaluation of hips. The patient is status post total hip replacement on the left as well as on the right with history of dislocation of the hips. The patient is discuss her condition with Dr. Marry Guan and at the present time patient is without plans for additional surgical intervention. The patient has been recommended to continue treatment in pain management and to proceed with lumbar epidural steroid injection in attempt to decrease the pain and weakness of the lower back and lower extremity regions. We will proceed with lumbar epidural steroid injection at time return appointment and will consider modification of treatment regimen pending follow-up evaluation. All grade suggested treatment plan. The patient will continue Neurontin Cymbalta and oxycodone as discussed.  Review of Systems     Objective:   Physical Exam  There was tenderness over the paraspinal musculature region cervical region and thoracic facet region with palpation over the thoracic region being without evidence of crepitus. There was tenderness over the region of the acromioclavicular and glenohumeral joint regions a moderate degree. The patient was with tenderness over the left and right acromioclavicular and glenohumeral joint regions. There was decreased grip strength of the upper extremities noted with Tinel and Phalen's maneuver reproducing moderate discomfort with well-healed surgical scars of the upper extremities noted without increased warmth and erythema in the region of the scars. Palpation over the lumbar region lumbar facet region was with moderate to severe discomfort with lateral bending rotation extension and palpation of the  lumbar facets reproducing moderately severe discomfort. Straight leg raising was tolerates approximately 20 without increased pain with dorsiflexion noted. There was moderate tenderness of the PSIS and PII S region a mild tenderness of the gluteal and piriformis musculature regions. There was negative clonus negative Homans without a definite sensory deficit or dermatomal dystrophy detected. Abdomen nontender with no costovertebral tenderness noted      Assessment & Plan:       Lumbar stenosis with neurogenic claudication  Dislocated left total hip replacement  Greater trochanteric bursitis  Degenerative changes lumbar spine L2-3, L3-4 degenerative changes with postoperative changes noted bilaterally, multilevel degenerative changes noted throughout the lumbar spine with neural foraminal narrowing and disc herniations as well  Lumbar facet syndrome  Sacroiliac joint dysfunction  Status post total hip replacements   DJD of shoulders  Status post surgery of upper extremities        PLAN   Continue present medication Neurontin and oxycodone Continue vitamin B complex and Cymbalta as well  Lumbar epidural steroid injection to be performed at time of return appointment  F/U PCP Dr.N Humphrey Rolls  for evaliation of  BP and general medical condition as discussed. . We will request medical clearance from your PCP for lumbar epidural steroid injection  F/U surgical evaluation. Follow-up Dr. Marry Guan as planned for evaluation of right hip as planned  F/U Dr. Merita Norton as discussed  F/U neurological evaluation. Follow-up with Dr. Manuella Ghazi as discussed  May consider radiofrequency rhizolysis or intraspinal procedures pending response to present treatment and F/U evaluation   Patient to call Pain Management Center should patient have concerns prior to scheduled return appointment

## 2016-04-23 NOTE — Progress Notes (Signed)
Safety precautions to be maintained throughout the outpatient stay will include: orient to surroundings, keep bed in low position, maintain call bell within reach at all times, provide assistance with transfer out of bed and ambulation.  Clearance faxed to Dr. Olivia Mackie McLean-Scocuzza to have LESI at 1200 on 04-23-16.

## 2016-05-03 ENCOUNTER — Ambulatory Visit: Payer: Medicare Other | Attending: Pain Medicine | Admitting: Pain Medicine

## 2016-05-03 ENCOUNTER — Encounter: Payer: Self-pay | Admitting: Pain Medicine

## 2016-05-03 VITALS — BP 123/63 | HR 54 | Temp 98.2°F | Resp 14 | Ht 63.0 in | Wt 181.0 lb

## 2016-05-03 DIAGNOSIS — M47816 Spondylosis without myelopathy or radiculopathy, lumbar region: Secondary | ICD-10-CM | POA: Insufficient documentation

## 2016-05-03 DIAGNOSIS — M48062 Spinal stenosis, lumbar region with neurogenic claudication: Secondary | ICD-10-CM

## 2016-05-03 DIAGNOSIS — Z9889 Other specified postprocedural states: Secondary | ICD-10-CM | POA: Diagnosis not present

## 2016-05-03 DIAGNOSIS — M5136 Other intervertebral disc degeneration, lumbar region: Secondary | ICD-10-CM

## 2016-05-03 DIAGNOSIS — M7061 Trochanteric bursitis, right hip: Secondary | ICD-10-CM

## 2016-05-03 DIAGNOSIS — M533 Sacrococcygeal disorders, not elsewhere classified: Secondary | ICD-10-CM

## 2016-05-03 DIAGNOSIS — M19012 Primary osteoarthritis, left shoulder: Secondary | ICD-10-CM

## 2016-05-03 DIAGNOSIS — M5030 Other cervical disc degeneration, unspecified cervical region: Secondary | ICD-10-CM

## 2016-05-03 DIAGNOSIS — M79606 Pain in leg, unspecified: Secondary | ICD-10-CM | POA: Diagnosis present

## 2016-05-03 DIAGNOSIS — M19011 Primary osteoarthritis, right shoulder: Secondary | ICD-10-CM

## 2016-05-03 DIAGNOSIS — Z96643 Presence of artificial hip joint, bilateral: Secondary | ICD-10-CM

## 2016-05-03 DIAGNOSIS — M545 Low back pain: Secondary | ICD-10-CM | POA: Diagnosis present

## 2016-05-03 DIAGNOSIS — M961 Postlaminectomy syndrome, not elsewhere classified: Secondary | ICD-10-CM

## 2016-05-03 MED ORDER — MIDAZOLAM HCL 5 MG/5ML IJ SOLN
5.0000 mg | Freq: Once | INTRAMUSCULAR | Status: DC
  Filled 2016-05-03: qty 5

## 2016-05-03 MED ORDER — CEFAZOLIN IN D5W 1 GM/50ML IV SOLN
1.0000 g | Freq: Once | INTRAVENOUS | Status: AC
  Administered 2016-05-03: 1 g via INTRAVENOUS

## 2016-05-03 MED ORDER — CEFAZOLIN SODIUM 1 G IJ SOLR
INTRAMUSCULAR | Status: AC
  Filled 2016-05-03: qty 10

## 2016-05-03 MED ORDER — FENTANYL CITRATE (PF) 100 MCG/2ML IJ SOLN
100.0000 ug | Freq: Once | INTRAMUSCULAR | Status: DC
  Filled 2016-05-03: qty 2

## 2016-05-03 MED ORDER — ORPHENADRINE CITRATE 30 MG/ML IJ SOLN
60.0000 mg | Freq: Once | INTRAMUSCULAR | Status: DC
  Filled 2016-05-03: qty 2

## 2016-05-03 MED ORDER — CEFUROXIME AXETIL 250 MG PO TABS: 250.0000 mg | ORAL_TABLET | Freq: Two times a day (BID) | ORAL | Status: DC

## 2016-05-03 MED ORDER — SODIUM CHLORIDE 0.9% FLUSH: 20.0000 mL | Freq: Once | INTRAVENOUS | Status: DC

## 2016-05-03 MED ORDER — TRIAMCINOLONE ACETONIDE 40 MG/ML IJ SUSP
40.0000 mg | Freq: Once | INTRAMUSCULAR | Status: DC
  Filled 2016-05-03: qty 1

## 2016-05-03 MED ORDER — LACTATED RINGERS IV SOLN
1000.0000 mL | INTRAVENOUS | Status: DC
Start: 2016-05-03 — End: 2021-10-31
  Administered 2016-05-03: 1000 mL via INTRAVENOUS

## 2016-05-03 MED ORDER — BUPIVACAINE HCL (PF) 0.5 % IJ SOLN: 30.0000 mL | Freq: Once | INTRAMUSCULAR | Status: DC

## 2016-05-03 MED ORDER — LIDOCAINE HCL (PF) 1 % IJ SOLN
10.0000 mL | Freq: Once | INTRAMUSCULAR | Status: AC
  Administered 2016-05-03: 10 mL via SUBCUTANEOUS
  Filled 2016-05-03: qty 10

## 2016-05-03 MED ORDER — BUPIVACAINE HCL (PF) 0.25 % IJ SOLN
30.0000 mL | Freq: Once | INTRAMUSCULAR | Status: AC
  Administered 2016-05-03: 30 mL
  Filled 2016-05-03: qty 30

## 2016-05-03 MED ORDER — SODIUM CHLORIDE 0.9 % IJ SOLN
INTRAMUSCULAR | Status: AC
  Filled 2016-05-03: qty 20

## 2016-05-03 NOTE — Progress Notes (Signed)
   Subjective:    Patient ID: Yvonne Singleton, female    DOB: Feb 16, 1935, 81 y.o.   MRN: [id number]  HPI  PROCEDURE PERFORMED: Lumbar epidural steroid injection   NOTE: The patient is a 81 y.o. female who returns to [facility] for further evaluation and treatment of pain involving the lumbar and lower extremity region. MRI revealed the patient to be with degenerative changes lumbar spine L2-3, L3-4 degenerative changes with postoperative changes noted bilaterally, multilevel degenerative changes noted throughout the lumbar spine with neural foraminal narrowing and disc herniations as well. There is concern regarding intraspinal abnormalities contributing to patient's symptomatology with concern regarding component of lumbar stenosis with neurogenic claudication The risks, benefits, and expectations of the procedure have been discussed and explained to the patient who was understanding and in agreement with suggested treatment plan. We will proceed with lumbar epidural steroid injection as discussed and as explained to the patient who is willing to proceed with procedure as planned.   DESCRIPTION OF PROCEDURE: Lumbar epidural steroid injection with IV Versed, IV fentanyl conscious sedation, EKG, blood pressure, pulse, capnography, and pulse oximetry monitoring. The procedure was performed with the patient in the prone position under fluoroscopic guidance. A local anesthetic skin wheal of 1.5% plain lidocaine was accomplished at proposed entry site. An 18-gauge Tuohy epidural needle was inserted at the L 4 vertebral body level left of the midline via loss-of-resistance technique with negative heme and negative CSF return. A total of 4 mL of Preservative-Free normal saline with 40 mg of Kenalog injected incrementally via epidurally placed needle. Needle was removed.    A total of 40 mg of Kenalog was utilized for the procedure.   The patient tolerated the injection well.    PLAN:    1. Medications: We will continue presently prescribed medications Neurontin Cymbalta and oxycodone. 2. Will consider modification of treatment regimen pending response to treatment rendered on today's visit and follow-up evaluation. 3. The patient is to follow-up with primary care physician Dr.N Humphrey Rolls regarding blood pressure and general medical condition status post lumbar epidural steroid injection performed on today's visit. 4. Surgical evaluation.Has been addressed  5. Neurological evaluation.We will avoid PNCV EMG studies and other studies at this time  6. The patient may be a candidate for radiofrequency procedures, implantation device, and other treatment pending response to treatment and follow-up evaluation. 7. The patient has been advised to adhere to proper body mechanics and avoid activities which appear to aggravate condition. 8. The patient has been advised to call the Pain Management Center prior to scheduled return appointment should there be significant change in condition or should there be sign  The patient is understanding and agrees with the suggested  treatment plan   Review of Systems     Objective:   Physical Exam        Assessment & Plan:

## 2016-05-03 NOTE — Progress Notes (Signed)
Patient here for procedure today d/t back pain.  Patient is now using 2 crutches for ambulation.  Reports hip replacement comes out of place from time to time for which she then must report to ED for evaluation.   Safety precautions to be maintained throughout the outpatient stay will include: orient to surroundings, keep bed in low position, maintain call bell within reach at all times, provide assistance with transfer out of bed and ambulation.

## 2016-05-03 NOTE — Patient Instructions (Addendum)
   PLAN   Continue present medication Neurontin and oxycodone Continue vitamin B complex and Cymbalta as well. Please get Ceftin antibiotic today and begin taking Ceftin antibiotic today as prescribed  F/U PCP Dr.N Humphrey Rolls  for evaliation of  BP and general medical condition as discussed  F/U surgical evaluation. Follow-up Dr. Marry Guan as planned for evaluation of right hip as planned  F/U Dr. Merita Norton as discussed  F/U neurological evaluation. Follow-up with Dr. Manuella Ghazi as discussed  May consider radiofrequency rhizolysis or intraspinal procedures pending response to present treatment and F/U evaluation   Patient to call Pain Management Center should patient have concerns prior to scheduled return appointmentPain Management Discharge Instructions  General Discharge Instructions :  If you need to reach your doctor call: Monday-Friday 8:00 am - 4:00 pm at [phone] or toll free [phone].  After clinic hours [phone] to have operator reach doctor.  Bring all of your medication bottles to all your appointments in the pain clinic.  To cancel or reschedule your appointment with Pain Management please remember to call 24 hours in advance to avoid a fee.  Refer to the educational materials which you have been given on: General Risks, I had my Procedure. Discharge Instructions, Post Sedation.  Post Procedure Instructions:  The drugs you were given will stay in your system until tomorrow, so for the next 24 hours you should not drive, make any legal decisions or drink any alcoholic beverages.  You may eat anything you prefer, but it is better to start with liquids then soups and crackers, and gradually work up to solid foods.  Please notify your doctor immediately if you have any unusual bleeding, trouble breathing or pain that is not related to your normal pain.  Depending on the type of procedure that was done, some parts of your body may feel week and/or numb.  This usually  clears up by tonight or the next day.  Walk with the use of an assistive device or accompanied by an adult for the 24 hours.  You may use ice on the affected area for the first 24 hours.  Put ice in a Ziploc bag and cover with a towel and place against area 15 minutes on 15 minutes off.  You may switch to heat after 24 hours.

## 2016-05-04 ENCOUNTER — Telehealth: Payer: Self-pay | Admitting: *Deleted

## 2016-05-04 NOTE — Telephone Encounter (Signed)
Spoke with patient re; procedure on yesterday, verbalizes no questions or concerns.  

## 2016-05-20 ENCOUNTER — Ambulatory Visit: Payer: Medicare Other | Attending: Pain Medicine | Admitting: Pain Medicine

## 2016-05-20 ENCOUNTER — Encounter: Payer: Self-pay | Admitting: Pain Medicine

## 2016-05-20 VITALS — BP 120/68 | HR 68 | Temp 98.7°F | Resp 18 | Ht 63.5 in | Wt 181.0 lb

## 2016-05-20 DIAGNOSIS — M7060 Trochanteric bursitis, unspecified hip: Secondary | ICD-10-CM | POA: Insufficient documentation

## 2016-05-20 DIAGNOSIS — M19011 Primary osteoarthritis, right shoulder: Secondary | ICD-10-CM | POA: Diagnosis not present

## 2016-05-20 DIAGNOSIS — M5030 Other cervical disc degeneration, unspecified cervical region: Secondary | ICD-10-CM

## 2016-05-20 DIAGNOSIS — M5126 Other intervertebral disc displacement, lumbar region: Secondary | ICD-10-CM | POA: Insufficient documentation

## 2016-05-20 DIAGNOSIS — T84021A Dislocation of internal left hip prosthesis, initial encounter: Secondary | ICD-10-CM | POA: Insufficient documentation

## 2016-05-20 DIAGNOSIS — X58XXXA Exposure to other specified factors, initial encounter: Secondary | ICD-10-CM | POA: Insufficient documentation

## 2016-05-20 DIAGNOSIS — M4806 Spinal stenosis, lumbar region: Secondary | ICD-10-CM | POA: Insufficient documentation

## 2016-05-20 DIAGNOSIS — M47816 Spondylosis without myelopathy or radiculopathy, lumbar region: Secondary | ICD-10-CM | POA: Diagnosis not present

## 2016-05-20 DIAGNOSIS — M533 Sacrococcygeal disorders, not elsewhere classified: Secondary | ICD-10-CM | POA: Insufficient documentation

## 2016-05-20 DIAGNOSIS — Z9889 Other specified postprocedural states: Secondary | ICD-10-CM | POA: Diagnosis not present

## 2016-05-20 DIAGNOSIS — M79606 Pain in leg, unspecified: Secondary | ICD-10-CM | POA: Diagnosis present

## 2016-05-20 DIAGNOSIS — Y792 Prosthetic and other implants, materials and accessory orthopedic devices associated with adverse incidents: Secondary | ICD-10-CM | POA: Diagnosis not present

## 2016-05-20 DIAGNOSIS — M48062 Spinal stenosis, lumbar region with neurogenic claudication: Secondary | ICD-10-CM

## 2016-05-20 DIAGNOSIS — Z96643 Presence of artificial hip joint, bilateral: Secondary | ICD-10-CM

## 2016-05-20 DIAGNOSIS — M545 Low back pain: Secondary | ICD-10-CM | POA: Diagnosis present

## 2016-05-20 DIAGNOSIS — M19012 Primary osteoarthritis, left shoulder: Secondary | ICD-10-CM | POA: Diagnosis not present

## 2016-05-20 DIAGNOSIS — M961 Postlaminectomy syndrome, not elsewhere classified: Secondary | ICD-10-CM

## 2016-05-20 DIAGNOSIS — M5136 Other intervertebral disc degeneration, lumbar region: Secondary | ICD-10-CM

## 2016-05-20 MED ORDER — OXYCODONE HCL 5 MG PO TABS: ORAL_TABLET | ORAL | 0 refills | Status: DC

## 2016-05-20 MED ORDER — GABAPENTIN 100 MG PO CAPS: ORAL_CAPSULE | ORAL | 0 refills | Status: DC

## 2016-05-20 NOTE — Progress Notes (Signed)
Safety precautions to be maintained throughout the outpatient stay will include: orient to surroundings, keep bed in low position, maintain call bell within reach at all times, provide assistance with transfer out of bed and ambulation.  

## 2016-05-20 NOTE — Progress Notes (Signed)
The patient is a 81 year old female who returns to pain management for further evaluation and treatment of pain involving the lower back and lower extremity region predominantly with pain involving the shoulders and wrists upper back and neck as well. The patient states that she had some improvement of her symptoms following lumbar epidural steroid injection and yet the numbness of the left lower extremity persist. We explained to patient that the numbness may persist despite interventional treatment as well as despite surgical intervention. We informed patient that she may wish to consider a second lumbar epidural steroid injection to see if she has any additional improvement of her symptoms. At the present time we will continue patient's Neurontin Cymbalta oxycodone and vitamin B complex and we will remain available to consider patient for interventional treatment as discussed in attempt to decrease the symptoms even was significantly. The patient also has been attending physical therapy and has been instructed by her physical therapist Shanon Brow that she may try standing on the left lower extremity in attempt to strengthen the muscles to prevent the hip replacement from becoming dislocated as well. We discussed patient undergoing exercise treatment to improve muscle tone to improve her condition overall. We will continue medications as prescribed at this time and we will remain available to consider lumbar epidural steroid injection and other treatment pending follow-up evaluations and decision patient. All agreed to suggested treatment plan     Physical examination   There was tenderness of the splenius capitis and occipitalis region palpation of these regions reproduced pain of mild degree to moderate degree. There was mild to moderate tenderness of the acromioclavicular and glenohumeral joint region with limited range of motion of the shoulder. The patient was unable to perform drop test without  moderate difficulty. The patient was with decreased grip strength with well-healed scars of the upper extremities. The patient was with tenderness over the thoracic region with no crepitus of the thoracic region noted. Palpation over the lumbar region was a tennis to palpation with palpation over the PSIS and PII S region reproducing moderate discomfort. There was moderate tenderness of the greater trochanteric region iliotibial band region with EHL strength decreased with questionably decreased sensation of the L5 dermatomal distribution. There was negative clonus negative Homans.. The abdomen was nontender with no costovertebral angle tenderness noted.    Assessment   Lumbar stenosis with neurogenic claudication  Dislocated left total hip replacement  Greater trochanteric bursitis  Degenerative changes lumbar spine L2-3, L3-4 degenerative changes with postoperative changes noted bilaterally, multilevel degenerative changes noted throughout the lumbar spine with neural foraminal narrowing and disc herniations as well  Lumbar facet syndrome  Sacroiliac joint dysfunction  Status post total hip replacements   DJD of shoulders  Status post surgery of upper extremities     PLAN   Continue present medications Neurontin and oxycodone Continue vitamin B complex and Cymbalta as well  F/U PCP Dr.N Humphrey Rolls  for evaliation of  BP and general medical condition as discussed. . We will request medical clearance from your PCP for lumbar epidural steroid injection  F/U surgical evaluation. Follow-up Dr. Marry Guan as planned for evaluation of right hip as planned  F/U Dr. Merita Norton as discussed  F/U neurological evaluation. Follow-up with Dr. Manuella Ghazi as discussed  Exercise we have caution to avoid aggravation of symptoms as we discussed  May consider radiofrequency rhizolysis or intraspinal procedures pending response to present treatment and F/U evaluation   Patient to call Pain Management  Center  should patient have concerns prior to scheduled return appointment

## 2016-05-20 NOTE — Patient Instructions (Addendum)
   PLAN   Continue present medications Neurontin and oxycodone Continue vitamin B complex and Cymbalta as well  F/U PCP Dr.N Humphrey Rolls  for evaliation of  BP and general medical condition as discussed. . We will request medical clearance from your PCP for lumbar epidural steroid injection  F/U surgical evaluation. Follow-up Dr. Marry Guan as planned for evaluation of right hip as planned  F/U Dr. Merita Norton as discussed  F/U neurological evaluation. Follow-up with Dr. Manuella Ghazi as discussed  Exercise we have caution to avoid aggravation of symptoms as we discussed  May consider radiofrequency rhizolysis or intraspinal procedures pending response to present treatment and F/U evaluation   Patient to call Pain Management Center should patient have concerns prior to scheduled return appointment

## 2016-06-17 ENCOUNTER — Encounter: Payer: Self-pay | Admitting: Pain Medicine

## 2016-06-17 ENCOUNTER — Ambulatory Visit: Payer: Medicare Other | Attending: Pain Medicine | Admitting: Pain Medicine

## 2016-06-17 VITALS — BP 122/62 | HR 81 | Temp 98.4°F | Resp 16 | Ht 63.0 in | Wt 184.0 lb

## 2016-06-17 DIAGNOSIS — M961 Postlaminectomy syndrome, not elsewhere classified: Secondary | ICD-10-CM

## 2016-06-17 DIAGNOSIS — M47816 Spondylosis without myelopathy or radiculopathy, lumbar region: Secondary | ICD-10-CM | POA: Insufficient documentation

## 2016-06-17 DIAGNOSIS — T84021A Dislocation of internal left hip prosthesis, initial encounter: Secondary | ICD-10-CM | POA: Diagnosis not present

## 2016-06-17 DIAGNOSIS — M19012 Primary osteoarthritis, left shoulder: Secondary | ICD-10-CM | POA: Insufficient documentation

## 2016-06-17 DIAGNOSIS — M48062 Spinal stenosis, lumbar region with neurogenic claudication: Secondary | ICD-10-CM

## 2016-06-17 DIAGNOSIS — Y792 Prosthetic and other implants, materials and accessory orthopedic devices associated with adverse incidents: Secondary | ICD-10-CM | POA: Insufficient documentation

## 2016-06-17 DIAGNOSIS — M533 Sacrococcygeal disorders, not elsewhere classified: Secondary | ICD-10-CM | POA: Insufficient documentation

## 2016-06-17 DIAGNOSIS — M5030 Other cervical disc degeneration, unspecified cervical region: Secondary | ICD-10-CM

## 2016-06-17 DIAGNOSIS — Z96643 Presence of artificial hip joint, bilateral: Secondary | ICD-10-CM

## 2016-06-17 DIAGNOSIS — M19011 Primary osteoarthritis, right shoulder: Secondary | ICD-10-CM | POA: Insufficient documentation

## 2016-06-17 DIAGNOSIS — M7060 Trochanteric bursitis, unspecified hip: Secondary | ICD-10-CM | POA: Diagnosis not present

## 2016-06-17 DIAGNOSIS — M5126 Other intervertebral disc displacement, lumbar region: Secondary | ICD-10-CM | POA: Diagnosis not present

## 2016-06-17 DIAGNOSIS — Z9889 Other specified postprocedural states: Secondary | ICD-10-CM

## 2016-06-17 DIAGNOSIS — M4806 Spinal stenosis, lumbar region: Secondary | ICD-10-CM | POA: Insufficient documentation

## 2016-06-17 DIAGNOSIS — M5136 Other intervertebral disc degeneration, lumbar region: Secondary | ICD-10-CM

## 2016-06-17 DIAGNOSIS — M545 Low back pain: Secondary | ICD-10-CM | POA: Diagnosis present

## 2016-06-17 MED ORDER — OXYCODONE HCL 5 MG PO TABS: ORAL_TABLET | ORAL | 0 refills | Status: DC

## 2016-06-17 MED ORDER — GABAPENTIN 100 MG PO CAPS: ORAL_CAPSULE | ORAL | 0 refills | Status: DC

## 2016-06-17 MED ORDER — GABAPENTIN 100 MG PO CAPS
ORAL_CAPSULE | ORAL | 0 refills | Status: DC
Start: 2016-06-17 — End: 2016-06-17

## 2016-06-17 NOTE — Patient Instructions (Addendum)
   PLAN   Continue present medications Neurontin and oxycodone. We will observe response to decreasing Neurontin and oxycodone as discussed Continue vitamin B complex and Cymbalta as well  F/U PCP Dr.N Humphrey Rolls  for evaluation of  BP and general medical condition as discussed. .  F/U surgical evaluation. Follow-up Dr. Marry Guan as planned for evaluation of right hip as planned  F/U Dr. Merita Norton as discussed  F/U neurological evaluation. Follow-up with Dr. Manuella Ghazi as discussed  Exercise we have caution to avoid aggravation of symptoms as we discussed  May consider radiofrequency rhizolysis or intraspinal procedures pending response to present treatment and F/U evaluation   Patient to call Pain Management Center should patient have concerns prior to scheduled return appointment

## 2016-06-17 NOTE — Progress Notes (Signed)
Patient here for medication management Safety precautions to be maintained throughout the outpatient stay will include: orient to surroundings, keep bed in low position, maintain call bell within reach at all times, provide assistance with transfer out of bed and ambulation.  

## 2016-06-17 NOTE — Progress Notes (Signed)
     The patient is a 81 year old female who returns to pain management for further evaluation and treatment of pain involving the region of the lower back lower extremity region predominantly with pain involving the shoulder and upper extremities as well. The patient states that she is with pain fairly well-controlled at this time. We discussed patient's overall condition and decision has been made to decrease patient's medications. We will have patient decrease both oxycodone and Neurontin at this time. The patient will also undergo follow-up evaluation with her primary care physician Dr.N Humphrey Rolls for further assessment of her condition as discussed. The patient denies trauma change in events of daily living the cost change in symptomatology. We remain available to consider additional modifications of treatment regimen as discussed and as explained to patient on today's visit. All agreed to suggested treatment plan. The patient states overall that her pain was very well controlled at this time.     Physical examination  There was tenderness of the splenius capitis and occipitalis region a mild degree with mild tenderness of the cervical facet thoracic facet region palpation which reproduces mild discomfort on the left as well as on the right. Palpation of the acromioclavicular and glenohumeral joint regions reproduce moderate discomfort with limited range of motion of the shoulder on the right as well as on the left. The patient was with difficulty performing the drop test. Palpation over the thoracic region was with tenderness to palpation of the trapezius levator scapula and rhomboid musculature region with no crepitus of the thoracic region noted. The patient was with decreased grip strength with well-healed scars of the upper extremities noted with Tinel and Phalen's maneuver reproducing moderate discomfort. There was tenderness over the region of the lumbar paraspinal must reason lumbar facet region of  mild to moderate degree. There was mild to moderate tenderness of the PSIS and PII S region. Straight leg raise was tolerates approximately 30 without increased pain with dorsiflexion noted. EHL strength appeared to be decreased. The knees were with tenderness to palpation. There was no significant lower 70 swelling noted. There was negative clonus negative Homans. Abdomen without excessive tenderness to palpation and no costovertebral tenderness noted.        Assessment     Lumbar stenosis with neurogenic claudication  Dislocated left total hip replacement  Greater trochanteric bursitis  Degenerative changes lumbar spine L2-3, L3-4 degenerative changes with postoperative changes noted bilaterally, multilevel degenerative changes noted throughout the lumbar spine with neural foraminal narrowing and disc herniations as well  Lumbar facet syndrome  Sacroiliac joint dysfunction  Status post total hip replacements   DJD of shoulders  Status post surgery of upper extremities        PLAN   Continue present medications Neurontin and oxycodone. We will observe response to decreasing Neurontin and oxycodone as discussed Continue vitamin B complex and Cymbalta   F/U PCP Dr.N Humphrey Rolls  for evaluation of  BP and general medical condition as discussed. .  F/U surgical evaluation. Follow-up Dr. Marry Guan as planned for evaluation of right hip as planned  F/U Dr. Merita Norton as discussed  F/U neurological evaluation. Follow-up with Dr. Manuella Ghazi as discussed  Exercise with caution to avoid aggravation of symptoms as we discussed  May consider radiofrequency rhizolysis or intraspinal procedures pending response to present treatment and F/U evaluation   Patient to call [facility] should patient have concerns prior to scheduled return appointment

## 2016-09-02 ENCOUNTER — Other Ambulatory Visit: Payer: Self-pay | Admitting: Internal Medicine

## 2016-09-02 DIAGNOSIS — Z1231 Encounter for screening mammogram for malignant neoplasm of breast: Secondary | ICD-10-CM

## 2016-09-28 ENCOUNTER — Emergency Department
Admission: EM | Admit: 2016-09-28 | Discharge: 2016-09-28 | Disposition: A | Discharge status: Home / Self Care | Payer: Medicare Other | Attending: Emergency Medicine | Admitting: Emergency Medicine

## 2016-09-28 ENCOUNTER — Emergency Department: Payer: Medicare Other

## 2016-09-28 DIAGNOSIS — Z96643 Presence of artificial hip joint, bilateral: Secondary | ICD-10-CM | POA: Insufficient documentation

## 2016-09-28 DIAGNOSIS — Z853 Personal history of malignant neoplasm of breast: Secondary | ICD-10-CM | POA: Insufficient documentation

## 2016-09-28 DIAGNOSIS — I10 Essential (primary) hypertension: Secondary | ICD-10-CM | POA: Insufficient documentation

## 2016-09-28 DIAGNOSIS — G20 Parkinson's disease: Secondary | ICD-10-CM | POA: Insufficient documentation

## 2016-09-28 DIAGNOSIS — N390 Urinary tract infection, site not specified: Secondary | ICD-10-CM | POA: Diagnosis not present

## 2016-09-28 DIAGNOSIS — R1032 Left lower quadrant pain: Secondary | ICD-10-CM | POA: Diagnosis present

## 2016-09-28 DIAGNOSIS — K529 Noninfective gastroenteritis and colitis, unspecified: Secondary | ICD-10-CM | POA: Diagnosis not present

## 2016-09-28 LAB — TYPE AND SCREEN
ABO/RH(D): O POS
ANTIBODY SCREEN: NEGATIVE

## 2016-09-28 LAB — URINALYSIS, COMPLETE (UACMP) WITH MICROSCOPIC
Bilirubin Urine: NEGATIVE
Glucose, UA: NEGATIVE mg/dL
Ketones, ur: NEGATIVE mg/dL
NITRITE: NEGATIVE
PH: 5 (ref 5.0–8.0)
Protein, ur: NEGATIVE mg/dL
SPECIFIC GRAVITY, URINE: 1.015 (ref 1.005–1.030)

## 2016-09-28 LAB — COMPREHENSIVE METABOLIC PANEL
ALBUMIN: 3.8 g/dL (ref 3.5–5.0)
ALT: 30 U/L (ref 14–54)
ANION GAP: 9 (ref 5–15)
AST: 37 U/L (ref 15–41)
Alkaline Phosphatase: 75 U/L (ref 38–126)
BILIRUBIN TOTAL: 0.9 mg/dL (ref 0.3–1.2)
BUN: 34 mg/dL — ABNORMAL HIGH (ref 6–20)
CO2: 24 mmol/L (ref 22–32)
Calcium: 9.1 mg/dL (ref 8.9–10.3)
Chloride: 103 mmol/L (ref 101–111)
Creatinine, Ser: 1.03 mg/dL — ABNORMAL HIGH (ref 0.44–1.00)
GFR, EST AFRICAN AMERICAN: 58 mL/min — AB (ref >60)
GFR, EST NON AFRICAN AMERICAN: 50 mL/min — AB (ref >60)
GLUCOSE: 129 mg/dL — AB (ref 65–99)
POTASSIUM: 3.6 mmol/L (ref 3.5–5.1)
Sodium: 136 mmol/L (ref 135–145)
TOTAL PROTEIN: 7.3 g/dL (ref 6.5–8.1)

## 2016-09-28 LAB — CBC WITH DIFFERENTIAL/PLATELET
BASOS PCT: 0 %
Basophils Absolute: 0 10*3/uL (ref 0–0.1)
Eosinophils Absolute: 0 10*3/uL (ref 0–0.7)
Eosinophils Relative: 0 %
HEMATOCRIT: 42.1 % (ref 35.0–47.0)
Hemoglobin: 13.9 g/dL (ref 12.0–16.0)
Lymphocytes Relative: 7 %
Lymphs Abs: 1.3 10*3/uL (ref 1.0–3.6)
MCH: 32.2 pg (ref 26.0–34.0)
MCHC: 33 g/dL (ref 32.0–36.0)
MCV: 97.5 fL (ref 80.0–100.0)
MONO ABS: 1 10*3/uL — AB (ref 0.2–0.9)
MONOS PCT: 5 %
NEUTROS ABS: 16.5 10*3/uL — AB (ref 1.4–6.5)
Neutrophils Relative %: 88 %
Platelets: 242 10*3/uL (ref 150–440)
RBC: 4.32 MIL/uL (ref 3.80–5.20)
RDW: 13.6 % (ref 11.5–14.5)
WBC: 18.7 10*3/uL — ABNORMAL HIGH (ref 3.6–11.0)

## 2016-09-28 LAB — LIPASE, BLOOD: LIPASE: 20 U/L (ref 11–51)

## 2016-09-28 LAB — TROPONIN I

## 2016-09-28 MED ORDER — METOCLOPRAMIDE HCL 10 MG PO TABS: 10.0000 mg | ORAL_TABLET | Freq: Four times a day (QID) | ORAL | 0 refills | Status: DC | PRN

## 2016-09-28 MED ORDER — METRONIDAZOLE 500 MG PO TABS: 500.0000 mg | ORAL_TABLET | Freq: Three times a day (TID) | ORAL | 0 refills | Status: AC

## 2016-09-28 MED ORDER — CIPROFLOXACIN HCL 500 MG PO TABS: 500.0000 mg | ORAL_TABLET | Freq: Two times a day (BID) | ORAL | 0 refills | Status: AC

## 2016-09-28 MED ORDER — SODIUM CHLORIDE 0.9 % IV BOLUS (SEPSIS)
500.0000 mL | Freq: Once | INTRAVENOUS | Status: AC
  Administered 2016-09-28: 500 mL via INTRAVENOUS

## 2016-09-28 MED ORDER — METRONIDAZOLE 500 MG PO TABS
500.0000 mg | ORAL_TABLET | Freq: Once | ORAL | Status: AC
  Administered 2016-09-28: 500 mg via ORAL
  Filled 2016-09-28: qty 1

## 2016-09-28 MED ORDER — CIPROFLOXACIN HCL 500 MG PO TABS
500.0000 mg | ORAL_TABLET | Freq: Once | ORAL | Status: AC
  Administered 2016-09-28: 500 mg via ORAL
  Filled 2016-09-28: qty 1

## 2016-09-28 MED ORDER — BARIUM SULFATE 2.1 % PO SUSP
450.0000 mL | ORAL | Status: AC
  Administered 2016-09-28 (×2): 450 mL via ORAL

## 2016-09-28 NOTE — ED Provider Notes (Signed)
[facility] Emergency Department Provider Note   ____________________________________________   First MD Initiated Contact with Patient 09/28/16 1533     (approximate)  I have reviewed the triage vital signs and the nursing notes.   HISTORY  Chief Complaint Rectal Bleeding    HPI Yvonne Singleton is a 81 y.o. female with a history of breast cancer as well as hypertension was presenting to the emergency department today with left lower quadrant abdominal pain that she has been expanding over the past day. Says the pain is sharp and cramping as well as intermittent. She denies any pain at this time. Denies any burning with urination. Says that she has been having nausea with dry heaves. Also with 10 episodes of diarrhea with bright red blood mixed with brown stool over the past 24 hours.   Past Medical History:  Diagnosis Date  . Back pain   . Breast cancer ([place]) 2007   right breast lumpectomy with rad tx  . Cancer [facility]) 2007   right breast  . Gout   . Hard of hearing   . Hypertension   . Parkinson's disease ([place])   . Sleep apnea     Patient Active Problem List   Diagnosis Date Noted  . History of bilateral hip replacements 05/28/2015  . Spinal stenosis, lumbar region, with neurogenic claudication 05/28/2015  . DDD (degenerative disc disease), lumbar 03/13/2015  . Lumbar post-laminectomy syndrome 03/13/2015  . Degenerative cervical disc 03/13/2015  . Status post bilateral total hip replacement 03/13/2015  . Sacroiliac joint dysfunction 03/13/2015  . History of surgery on upper extremity 03/13/2015  . DJD of shoulder 03/13/2015  . Rheumatoid arthritis ([place]) 02/17/2015    Past Surgical History:  Procedure Laterality Date  . ABDOMINAL HYSTERECTOMY    . CATARACT EXTRACTION Bilateral   . EYE SURGERY Bilateral   . HAND SURGERY    . HIP CLOSED REDUCTION Left 06/24/2015   Procedure: CLOSED REDUCTION HIP;  Surgeon: Dereck Leep, MD;   Location: [facility] ORS;  Service: Orthopedics;  Laterality: Left;  . HIP CLOSED REDUCTION Left 02/09/2016   Procedure: CLOSED MANIPULATION HIP;  Surgeon: Earnestine Leys, MD;  Location: [facility] ORS;  Service: Orthopedics;  Laterality: Left;  . HIP SURGERY    . JOINT REPLACEMENT     bilateral hip  . TOTAL KNEE ARTHROPLASTY Bilateral     Prior to Admission medications   Medication Sig Start Date End Date Taking? Authorizing Provider  alendronate (FOSAMAX) 70 MG tablet Take 70 mg by mouth once a week. Pt takes on Wednesday.   Take with a full glass of water on an empty stomach.    Historical Provider, MD  allopurinol (ZYLOPRIM) 100 MG tablet Take 200 mg by mouth at bedtime.     Historical Provider, MD  b complex vitamins tablet Take 1 tablet by mouth daily.    Historical Provider, MD  Calcium Carbonate-Vitamin D (CALCIUM 600+D) 600-200 MG-UNIT TABS Take 1 tablet by mouth daily.    Historical Provider, MD  carbidopa-levodopa (SINEMET CR) 50-200 MG per tablet Take 1 tablet by mouth at bedtime.     Historical Provider, MD  cefUROXime (CEFTIN) 250 MG tablet Take 1 tablet (250 mg total) by mouth 2 (two) times daily with a meal. Patient not taking: Reported on 06/17/2016 05/03/16   Mohammed Kindle, MD  cholecalciferol (VITAMIN D) 1000 units tablet Take 2,000 Units by mouth daily.    Historical Provider, MD  DULoxetine (CYMBALTA) 30 MG capsule Take 30  mg by mouth daily.    Historical Provider, MD  furosemide (LASIX) 20 MG tablet Take 20 mg by mouth daily.     Historical Provider, MD  gabapentin (NEURONTIN) 100 MG capsule Limit 1 capsule by mouth per day if tolerated 06/17/16   Mohammed Kindle, MD  gabapentin (NEURONTIN) 100 MG capsule Limit 1 capsule by mouth per day or every other day if tolerated 06/17/16   Mohammed Kindle, MD  HYDROcodone-acetaminophen [facility]) 5-325 MG tablet Take 1-2 tablets by mouth every 6 (six) hours as needed. 02/09/16   Earnestine Leys, MD  hydroxychloroquine (PLAQUENIL) 200 MG tablet Take 400 mg  by mouth daily.     Historical Provider, MD  losartan (COZAAR) 100 MG tablet Take 100 mg by mouth daily.     Historical Provider, MD  oxyCODONE (OXY IR/ROXICODONE) 5 MG immediate release tablet Limit 1/2 - 1 tablet by mouth per day or 2  times per day if tolerated 06/17/16   Mohammed Kindle, MD  tolterodine (DETROL LA) 4 MG 24 hr capsule Take 4 mg by mouth daily.    Historical Provider, MD    Allergies Trospium; Iodinated diagnostic agents; and Streptomycin  Family History  Problem Relation Age of Onset  . Heart disease Mother   . Arthritis Mother   . Heart disease Father   . Ulcers Father   . Breast cancer Maternal Aunt     Social History Social History  Substance Use Topics  . Smoking status: Never Smoker  . Smokeless tobacco: Never Used  . Alcohol use No    Review of Systems Constitutional: No fever/chills Eyes: No visual changes. ENT: No sore throat. Cardiovascular: Denies chest pain. Respiratory: Denies shortness of breath. Gastrointestinal: No constipation. Genitourinary: Negative for dysuria. Musculoskeletal: Negative for back pain. Skin: Negative for rash. Neurological: Negative for headaches, focal weakness or numbness.  10-point ROS otherwise negative.  ____________________________________________   PHYSICAL EXAM:  VITAL SIGNS: ED Triage Vitals  Enc Vitals Group     BP 09/28/16 1500 (!) 150/64     Pulse Rate 09/28/16 1500 78     Resp 09/28/16 1618 20     Temp 09/28/16 1500 98.8 F (37.1 C)     Temp src --      SpO2 09/28/16 1500 95 %     Weight 09/28/16 1501 188 lb (85.3 kg)     Height 09/28/16 1501 5\' 3"  (1.6 m)     Head Circumference --      Peak Flow --      Pain Score 09/28/16 1618 2     Pain Loc --      Pain Edu? --      Excl. in [place]? --     Constitutional: Alert and oriented. Well appearing and in no acute distress. Eyes: Conjunctivae are normal. PERRL. EOMI. Head: Atraumatic. Nose: No congestion/rhinnorhea. Mouth/Throat: Mucous  membranes are moist.   Neck: No stridor.   Cardiovascular: Normal rate, regular rhythm. Grossly normal heart sounds.  Respiratory: Normal respiratory effort.  No retractions. Lungs CTAB. Gastrointestinal: Soft with mild left lower quadrant abdominal tenderness to palpation. No distention. Musculoskeletal: No lower extremity tenderness nor edema.  No joint effusions. Neurologic:  Normal speech and language. No gross focal neurologic deficits are appreciated.  Skin:  Skin is warm, dry and intact. No rash noted. Psychiatric: Mood and affect are normal. Speech and behavior are normal.  ____________________________________________   LABS (all labs ordered are listed, but only abnormal results are displayed)  Labs Reviewed  CBC WITH DIFFERENTIAL/PLATELET - Abnormal; Notable for the following:       Result Value   WBC 18.7 (*)    Neutro Abs 16.5 (*)    Monocytes Absolute 1.0 (*)    All other components within normal limits  COMPREHENSIVE METABOLIC PANEL - Abnormal; Notable for the following:    Glucose, Bld 129 (*)    BUN 34 (*)    Creatinine, Ser 1.03 (*)    GFR calc non Af Amer 50 (*)    GFR calc Af Amer 58 (*)    All other components within normal limits  URINALYSIS, COMPLETE (UACMP) WITH MICROSCOPIC - Abnormal; Notable for the following:    Color, Urine YELLOW (*)    APPearance CLEAR (*)    Hgb urine dipstick MODERATE (*)    Leukocytes, UA LARGE (*)    Bacteria, UA RARE (*)    Squamous Epithelial / LPF 0-5 (*)    All other components within normal limits  GASTROINTESTINAL PANEL BY PCR, STOOL (REPLACES STOOL CULTURE)  C DIFFICILE QUICK SCREEN W PCR REFLEX  URINE CULTURE  LIPASE, BLOOD  TROPONIN I  TYPE AND SCREEN   ____________________________________________  EKG  ED ECG REPORT I, Doran Stabler, the attending physician, personally viewed and interpreted this ECG.   Date: 09/28/2016  EKG Time: 1718  Rate: 77  Rhythm: normal sinus rhythm  Axis: Normal   Intervals:none  ST&T Change: No ST segment elevation or depression. No abnormal T-wave inversion.  ____________________________________________  RADIOLOGY    CT Abdomen Pelvis Wo Contrast (Final result)  Result time 09/28/16 19:50:46  Final result by Elon Alas, MD (09/28/16 19:50:46)           Narrative:   CLINICAL DATA: Rectal bleeding beginning yesterday. Nausea and vomiting. History of hysterectomy, cholecystectomy, breast cancer.  EXAM: CT ABDOMEN AND PELVIS WITHOUT CONTRAST  TECHNIQUE: Multidetector CT imaging of the abdomen and pelvis was performed following the standard protocol without IV contrast. Enteric contrast administered.  COMPARISON: None.  FINDINGS: LOWER CHEST: Lung bases are clear. The visualized heart size is normal. Mild coronary artery calcifications. No pericardial effusion.  HEPATOBILIARY: Status post cholecystectomy. Mild postprocedural intrahepatic biliary dilatation.  PANCREAS: Normal.  SPLEEN: Normal.  ADRENALS/URINARY TRACT: Kidneys are orthotopic, demonstrating normal size and morphology. No nephrolithiasis, hydronephrosis; limited assessment for renal masses on this nonenhanced examination. The unopacified ureters are normal in course and caliber. Urinary bladder is partially distended and unremarkable. Normal adrenal glands.  STOMACH/BOWEL: Moderate to large hiatal hernia. The stomach, small and large bowel are normal in course and caliber without inflammatory changes. Duodenum diverticulum. Circumferential colon wall edema and pericolonic fat stranding from the splenic flexure to the proximal sigmoid colon. Moderate sigmoid diverticulosis mild remaining colonic diverticulosis. Mobile cecum within RIGHT mid abdomen.  VASCULAR/LYMPHATIC: Aortoiliac vessels are normal in course and caliber, moderate calcific atherosclerosis. No lymphadenopathy by CT size criteria.  REPRODUCTIVE: Status post hysterectomy.  OTHER:  Small amount of free fluid in the pelvis. No intraperitoneal free air or drainable fluid collections.  MUSCULOSKELETAL: Non-acute. Streak artifact from bilateral hip total arthroplasties. Subcentimeter calcification partially imaged in RIGHT breast. Moderate fat containing RIGHT paraumbilical ventral hernia, 2.2 cm neck. Anterior abdominal wall scarring. Moderate LEFT sacroiliac osteoarthrosis. Multilevel severe degenerative change of the lumbar spine.  IMPRESSION: Acute LEFT colitis without complication. Diverticulosis without convincing evidence of diverticulitis.  Moderate to large hiatal hernia.  Moderate fat containing ventral hernia.   Electronically Signed By: Elon Alas M.D. On: 09/28/2016 19:50  ____________________________________________   PROCEDURES  Procedure(s) performed:   Procedures  Critical Care performed:   ____________________________________________   INITIAL IMPRESSION / ASSESSMENT AND PLAN / ED COURSE  Pertinent labs & imaging results that were available during my care of the patient were reviewed by me and considered in my medical decision making (see chart for details).   Clinical Course   ----------------------------------------- 8:09 PM on 09/28/2016 -----------------------------------------  Patient resting comfortably at this time and was able to tolerate by mouth contrast. Has not had any episodes of diarrhea in the emergency department. Unable to do stool studies. We will treat empirically with Cipro and Flagyl. Also with signs of UTI on her urinalysis. Cipro should provide double coverage but a urine culture was sent. Explained the diagnosis as well as plan to the patient. I also gave her strict return precautions. She says that she has been feeling achy all over and I retook her temperature at this time and was 99.2. White count was elevated 18.7. However, the patient appears well this time and her clinical  symptoms appear mild and without any diarrhea over the past several hours in the emergency department. We will do an outpatient trial of antibiotics. However, we discussed that if she has any worsening or concerning symptoms should come back to the emergency department for admission. She is understanding of this plan and willing to comply. Also says that she has tolerated Cipro well the past.  ____________________________________________   FINAL CLINICAL IMPRESSION(S) / ED DIAGNOSES  Colitis. UTI. Left lower quadrant abdominal pain.    NEW MEDICATIONS STARTED DURING THIS VISIT:  New Prescriptions   No medications on file     Note:  This document was prepared using Dragon voice recognition software and may include unintentional dictation errors.    Orbie Pyo, MD 09/28/16 2012

## 2016-09-28 NOTE — ED Triage Notes (Signed)
Pt from home via EMS, reports small amount of rectal bleeding since yesterday, also reports nausea and some abd pain. 4 zofran given by EMS with relief

## 2016-10-02 LAB — URINE CULTURE: Culture: 80000 — AB

## 2016-10-13 ENCOUNTER — Ambulatory Visit: Payer: Medicare Other

## 2016-10-20 ENCOUNTER — Ambulatory Visit
Admission: RE | Admit: 2016-10-20 | Discharge: 2016-10-20 | Disposition: A | Discharge status: Home / Self Care | Payer: Medicare Other | Source: Ambulatory Visit | Attending: Internal Medicine | Admitting: Internal Medicine

## 2016-10-20 DIAGNOSIS — Z1231 Encounter for screening mammogram for malignant neoplasm of breast: Secondary | ICD-10-CM | POA: Insufficient documentation

## 2016-10-29 DIAGNOSIS — F0280 Dementia in other diseases classified elsewhere without behavioral disturbance: Secondary | ICD-10-CM | POA: Insufficient documentation

## 2016-10-29 DIAGNOSIS — G20 Parkinson's disease: Secondary | ICD-10-CM | POA: Insufficient documentation

## 2016-12-27 ENCOUNTER — Ambulatory Visit: Payer: Medicare Other | Admitting: Occupational Therapy

## 2017-01-03 ENCOUNTER — Ambulatory Visit: Payer: Medicare Other | Admitting: Occupational Therapy

## 2017-01-03 ENCOUNTER — Encounter: Payer: Self-pay | Admitting: Occupational Therapy

## 2017-01-03 ENCOUNTER — Ambulatory Visit: Payer: Medicare Other | Attending: Neurology | Admitting: Occupational Therapy

## 2017-01-03 DIAGNOSIS — R278 Other lack of coordination: Secondary | ICD-10-CM | POA: Diagnosis present

## 2017-01-03 DIAGNOSIS — M6281 Muscle weakness (generalized): Secondary | ICD-10-CM | POA: Insufficient documentation

## 2017-01-03 DIAGNOSIS — R262 Difficulty in walking, not elsewhere classified: Secondary | ICD-10-CM

## 2017-01-03 DIAGNOSIS — R2681 Unsteadiness on feet: Secondary | ICD-10-CM

## 2017-01-04 ENCOUNTER — Encounter: Payer: Self-pay | Admitting: Occupational Therapy

## 2017-01-04 ENCOUNTER — Ambulatory Visit: Payer: Medicare Other | Admitting: Occupational Therapy

## 2017-01-04 DIAGNOSIS — R262 Difficulty in walking, not elsewhere classified: Secondary | ICD-10-CM

## 2017-01-04 DIAGNOSIS — M6281 Muscle weakness (generalized): Secondary | ICD-10-CM | POA: Diagnosis not present

## 2017-01-04 DIAGNOSIS — R278 Other lack of coordination: Secondary | ICD-10-CM

## 2017-01-04 DIAGNOSIS — R2681 Unsteadiness on feet: Secondary | ICD-10-CM

## 2017-01-04 NOTE — Therapy (Signed)
[place] MAIN [facility] SERVICES [address] [place], [place], [address] Phone: [phone]   Fax:  [phone]  Occupational Therapy Evaluation  Patient Details  Name: Yvonne Singleton MRN: [id number] Date of Birth: 1935/01/07 Referring Provider: Manuella Ghazi  Encounter Date: 01/03/2017      OT End of Session - 01/04/17 0930    Visit Number 1   Number of Visits 17   Date for OT Re-Evaluation 02/14/17   Authorization Type Medicare G code 1    OT Start Time 3664   OT Stop Time 1156   OT Time Calculation (min) 65 min   Activity Tolerance Patient tolerated treatment well   Behavior During Therapy [facility] for tasks assessed/performed      Past Medical History:  Diagnosis Date  . Back pain   . Breast cancer ([place]) 2007   right breast lumpectomy with rad tx  . Cancer [facility]) 2007   right breast  . Gout   . Hard of hearing   . Hypertension   . Parkinson's disease ([place])   . Sleep apnea     Past Surgical History:  Procedure Laterality Date  . ABDOMINAL HYSTERECTOMY    . CATARACT EXTRACTION Bilateral   . EYE SURGERY Bilateral   . HAND SURGERY    . HIP CLOSED REDUCTION Left 06/24/2015   Procedure: CLOSED REDUCTION HIP;  Surgeon: Dereck Leep, MD;  Location: [facility] ORS;  Service: Orthopedics;  Laterality: Left;  . HIP CLOSED REDUCTION Left 02/09/2016   Procedure: CLOSED MANIPULATION HIP;  Surgeon: Earnestine Leys, MD;  Location: [facility] ORS;  Service: Orthopedics;  Laterality: Left;  . HIP SURGERY    . JOINT REPLACEMENT     bilateral hip  . TOTAL KNEE ARTHROPLASTY Bilateral     There were no vitals filed for this visit.         [facility] OT Assessment - 01/04/17 1858      Assessment   Diagnosis Parkinson's disease   Referring Provider Manuella Ghazi   Prior Therapy none     Precautions   Precautions Fall     Restrictions   Weight Bearing Restrictions No     Balance Screen   Has the patient fallen in the past 6 months Yes   How many times? 2  times in the last 6 weeks.    Has the patient had a decrease in activity level because of a fear of falling?  Yes   Is the patient reluctant to leave their home because of a fear of falling?  Yes     Home  Environment   Family/patient expects to be discharged to: Private residence   Living Arrangements Spouse/significant other   Available Help at Discharge Family   Type of [place] One level   Alternate Level Stairs - Number of Steps 2   Bathroom Building control surveyor;Door   Visual merchandiser;Shower seat;Grab bars - tub/shower;Wheelchair - manual   Lives With Spouse     Prior Function   Level of Independence Independent   Vocation Retired     ADL   Eating/Feeding Modified independent   Grooming Minimal assistance   Upper Body Bathing Modified independent   Lower Body Bathing Modified independent   Upper Body Dressing Needs assist for fasteners;Increased time   Lower Body Dressing Increased time;Needs assist for fasteners   Toilet Tranfer Modified independent   Toileting - Clothing Manipulation  Modified independent   Toileting -  Hygiene Modified Independent   Tub/Shower Transfer Modified independent   ADL comments Patient reports she has some anxiety in the kitchen when cooking and has to have husband around.  She has a specialized utensils for self feeding due to tremors in right hand.  She has a walk in shower with a shower seat but reports increased fear of falling with shower transfers, performs only when husband is at home.  She has had 2 falls in the last 6 months, one when she was getting into bed and slid to the floor, the other one happened when she was walking around the bed and fell.  She has had bilateral hip replacements in the past and has dislocated her left hip 4 times.  She continues to follow hip (modified) precautions and is careful with movements but reports she does reach down to  put on socks and shoes.      IADL   Prior Level of Function Shopping independent   Shopping Needs to be accompanied on any shopping trip   Prior Level of Function Light Housekeeping independent   Light Housekeeping Performs light daily tasks such as dishwashing, bed making   Prior Level of Function Meal Prep independent   Meal Prep Able to complete simple warm meal prep   Medication Management Is responsible for taking medication in correct dosages at correct time   Prior Level of Function Financial Management independent   Financial Management Manages financial matters independently (budgets, writes checks, pays rent, bills goes to bank), collects and keeps track of income     Mobility   Mobility Status History of falls;Needs assist   Mobility Status Comments Patient has ambulated with regular crutches for the last 10 years, she has to modify her hand grips due to rheumatoid arthritis and previous injury/surgery to tendon in left hand. She has not tried a Landscape architect.  She reports left leg numbness from the left leg to foot.  She also has back pain and has a history of back surgery in the 1980s.  She reports problems with left IT band as well.      Written Expression   Handwriting Mild micrographia;Not legible     Vision - History   Baseline Vision Wears glasses all the time   Visual History Macular degeneration     Cognition   Overall Cognitive Status Within Functional Limits for tasks assessed   Memory Impaired  reports confusion at times, i.e. putting the milk in pantry     Sensation   Light Touch Appears Intact   Hot/Cold Appears Intact   Proprioception Appears Intact   Additional Comments Has some numbness and tingling in both hands for the last couple years.      Coordination   Gross Motor Movements are Fluid and Coordinated No   Fine Motor Movements are Fluid and Coordinated No   9 Hole Peg Test Right;Left   Right 9 Hole Peg Test 23 sec    Left 9 Hole Peg Test 30  sec     AROM   Overall AROM  Deficits   Overall AROM Comments BUE shoulder flexion actively to 90 degrees of motion, difficulty with reaching the back of the head, WFLs for elbow, wrist and hand for active movement.  Able to perform opposition of thumb to all digits.       Strength   Overall Strength Deficits   Overall Strength Comments 2+/5 BUE, 2+/5 BLE  Hand Function   Right Hand Grip (lbs) 12   Right Hand Lateral Pinch 10 lbs   Right Hand 3 Point Pinch 6 lbs   Left Hand Grip (lbs) 11   Left Hand Lateral Pinch 7 lbs   Left 3 point pinch 5 lbs      6 minute walk test 540 feet, 5 times sit to stand 38 sec, Freezing of gait questionairre 13, Berg balance test 31/56. Patient ambulates with regular crutches, forwards flexed posture, puts pressure to axillary area when walking to avoid pressure to wrist and hands. Patient with significant balance deficits and has difficulty with weight shifts to advance and lift feet to step forwards and to the side. More difficulty with shifting to right than left.  Patient instructed on 1st 2 exercises of maximal daily exercises with sit to stand and side to side in sitting with cues and modifications to avoid increased hip flexion.                     OT Education - 01/04/17 [phone]    Education provided Yes   Education Details LSVT BIG, goals   Person(s) Educated Patient   Methods Explanation   Comprehension Verbalized understanding             OT Long Term Goals - 01/04/17 1012      OT LONG TERM GOAL #1   Title Patient will improve gait speed and endurance and be able to walk 650 feet in 6 minutes to negotiate around the home and community safely in 4 weeks   Baseline 540 feet at eval    Time 4   Period Weeks   Status New     OT LONG TERM GOAL #2   Title Patient will complete HEP for maximal daily exercises with modified independence in 4 weeks   Baseline no current program at eval   Time 4   Period Weeks   Status  New     OT LONG TERM GOAL #3   Title Patient will transfer from sit to stand without the use of arms safely and independently from a variety of chairs/surfaces in 4 weeks.   Baseline 5 times sit to stand 38 secs   Time 4   Period Weeks   Status New     OT LONG TERM GOAL #4   Title Patient will complete navigating in narrow spaces without evidence of freezing or hesitations.   Baseline hesitations at eval   Time 4   Period Weeks   Status New     OT LONG TERM GOAL #5   Title Patient will complete shower transfers with modified independence with decreased reported fear of falling.   Baseline Patient with increased fear of falling   Time 4   Period Weeks   Status New               Plan - 01/04/17 0931    Clinical Impression Statement Patient is a 82 yo female diagnosed with Parkinson's disease and was referred by her physician for LSVT BIG program. Patient presents with decreased step length and size with gait patterns, decreased balance, hesitations/freezing of gait with initiation of gait as well as with turns, decreased coordination, and muscle strength which affect her ability to perform daily tasks. She will require extensive modifications for participation in [place] program due to multiple medical issues, particularly regarding history of hip replacements with 4 prior dislocations of her left hip, limitations with  RA, poor balance, back pain, recent falls and long standing use of crutches.  The  patient is judged to be a good candidate for the LSVT BIG program. She would benefit from and was referred for the LSVT BIG program which is an intensive program designed specifically for Parkinson's patients with a focus on increasing amplitude and speed of movements, improving self-care and daily tasks and providing patients with daily exercises to improve overall function. It is recommended that the patient receive the LSVT BIG program, which is comprised of 16 intensive sessions (4  times per week for 4 weeks, one hour sessions). Prognosis for improvement is good based on her motivation and family support. LSVT BIG has been documented in the literature as efficacious for individuals with Parkinson's disease.     Rehab Potential Good   Clinical Impairments Affecting Rehab Potential positive:  motivation, family support, negative:  co morbidities, progressive disease processs   OT Frequency 4x / week   OT Duration 4 weeks   OT Treatment/Interventions Self-care/ADL training;DME and/or AE instruction;Patient/family education;Gait Training;Therapeutic exercises;Balance training;Therapeutic exercise;Stair Training;Therapeutic activities;Neuromuscular education;Functional Mobility Training   Consulted and Agree with Plan of Care Patient      Patient will benefit from skilled therapeutic intervention in order to improve the following deficits and impairments:  Abnormal gait, Decreased coordination, Decreased range of motion, Difficulty walking, Impaired flexibility, Decreased endurance, Improper body mechanics, Decreased activity tolerance, Decreased balance, Decreased knowledge of use of DME, Impaired UE functional use, Pain, Decreased mobility, Decreased strength  Visit Diagnosis: Muscle weakness (generalized)  Difficulty in walking, not elsewhere classified  Other lack of coordination  Unsteadiness on feet    Problem List Patient Active Problem List   Diagnosis Date Noted  . History of bilateral hip replacements 05/28/2015  . Spinal stenosis, lumbar region, with neurogenic claudication 05/28/2015  . DDD (degenerative disc disease), lumbar 03/13/2015  . Lumbar post-laminectomy syndrome 03/13/2015  . Degenerative cervical disc 03/13/2015  . Status post bilateral total hip replacement 03/13/2015  . Sacroiliac joint dysfunction 03/13/2015  . History of surgery on upper extremity 03/13/2015  . DJD of shoulder 03/13/2015  . Rheumatoid arthritis ([place]) 02/17/2015   Anjanae Woehrle T  Kolbie Lepkowski, OTR/L, CLT  Betheny Suchecki 01/04/2017, 7:10 PM  [place] MAIN [facility] SERVICES [address] [place], [place], [address] Phone: [phone]   Fax:  [phone]  Name: Kapri Nero MRN: [id number] Date of Birth: Dec 17, 1934

## 2017-01-05 ENCOUNTER — Ambulatory Visit: Payer: Medicare Other | Admitting: Occupational Therapy

## 2017-01-05 NOTE — Therapy (Signed)
[place] MAIN [facility] SERVICES [address] [place], [place], [address] Phone: [phone]   Fax:  [phone]  Occupational Therapy Treatment  Patient Details  Name: Yvonne Singleton MRN: [id number] Date of Birth: [birth year] Referring Provider: Manuella Ghazi  Encounter Date: 01/04/2017      OT End of Session - 01/04/17 2012    Visit Number 2   Number of Visits 17   Date for OT Re-Evaluation 02/14/17   Authorization Type Medicare G code 2   OT Start Time 1100   OT Stop Time 1158   OT Time Calculation (min) 58 min   Activity Tolerance Patient tolerated treatment well   Behavior During Therapy [facility] for tasks assessed/performed      Past Medical History:  Diagnosis Date  . Back pain   . Breast cancer ([place]) 2007   right breast lumpectomy with rad tx  . Cancer [facility]) 2007   right breast  . Gout   . Hard of hearing   . Hypertension   . Parkinson's disease ([place])   . Sleep apnea     Past Surgical History:  Procedure Laterality Date  . ABDOMINAL HYSTERECTOMY    . CATARACT EXTRACTION Bilateral   . EYE SURGERY Bilateral   . HAND SURGERY    . HIP CLOSED REDUCTION Left 06/24/2015   Procedure: CLOSED REDUCTION HIP;  Surgeon: Dereck Leep, MD;  Location: [facility] ORS;  Service: Orthopedics;  Laterality: Left;  . HIP CLOSED REDUCTION Left 02/09/2016   Procedure: CLOSED MANIPULATION HIP;  Surgeon: Earnestine Leys, MD;  Location: [facility] ORS;  Service: Orthopedics;  Laterality: Left;  . HIP SURGERY    . JOINT REPLACEMENT     bilateral hip  . TOTAL KNEE ARTHROPLASTY Bilateral     There were no vitals filed for this visit.      Subjective Assessment - 01/04/17 2010    Subjective  Patient reports she is looking forwards to trying the platform walker today to see if it will help her.    Pertinent History Patient reports she was diagnosed with Parkinson's disease about 3-4 years ago, her initial symptoms was right arm and tremors, some slight tremors  noted in left arm as well.     Patient Stated Goals Patient reports she would like to be able to do things easier.     Currently in Pain? Yes   Pain Score 5    Pain Location Back   Pain Orientation Lower   Pain Descriptors / Indicators Aching            [facility] OT Assessment - 01/04/17 1858      Assessment   Diagnosis Parkinson's disease   Referring Provider Manuella Ghazi   Prior Therapy none     Precautions   Precautions Fall     Restrictions   Weight Bearing Restrictions No     Balance Screen   Has the patient fallen in the past 6 months Yes   How many times? 2 times in the last 6 weeks.    Has the patient had a decrease in activity level because of a fear of falling?  Yes   Is the patient reluctant to leave their home because of a fear of falling?  Yes     Home  Environment   Family/patient expects to be discharged to: Private residence   Living Arrangements Spouse/significant other   Available Help at Discharge Family   Type of Ages  Home Layout One level   Alternate Level Stairs - Number of Steps 2   Bathroom Building control surveyor;Door   Visual merchandiser;Shower seat;Grab bars - tub/shower;Wheelchair - manual   Lives With Spouse     Prior Function   Level of Independence Independent   Vocation Retired     ADL   Eating/Feeding Modified independent   Grooming Minimal assistance   Upper Body Bathing Modified independent   Lower Body Bathing Modified independent   Upper Body Dressing Needs assist for fasteners;Increased time   Lower Body Dressing Increased time;Needs assist for fasteners   Toilet Tranfer Modified independent   Toileting - Clothing Manipulation Modified independent   Toileting -  Hygiene Modified Independent   Tub/Shower Transfer Modified independent   ADL comments Patient reports she has some anxiety in the kitchen when cooking and has to have husband around.  She has a  specialized utensils for self feeding due to tremors in right hand.  She has a walk in shower with a shower seat but reports increased fear of falling with shower transfers, performs only when husband is at home.  She has had 2 falls in the last 6 months, one when she was getting into bed and slid to the floor, the other one happened when she was walking around the bed and fell.  She has had bilateral hip replacements in the past and has dislocated her left hip 4 times.  She continues to follow hip (modified) precautions and is careful with movements but reports she does reach down to put on socks and shoes.      IADL   Prior Level of Function Shopping independent   Shopping Needs to be accompanied on any shopping trip   Prior Level of Function Light Housekeeping independent   Light Housekeeping Performs light daily tasks such as dishwashing, bed making   Prior Level of Function Meal Prep independent   Meal Prep Able to complete simple warm meal prep   Medication Management Is responsible for taking medication in correct dosages at correct time   Prior Level of Function Financial Management independent   Financial Management Manages financial matters independently (budgets, writes checks, pays rent, bills goes to bank), collects and keeps track of income     Mobility   Mobility Status History of falls;Needs assist   Mobility Status Comments Patient has ambulated with regular crutches for the last 10 years, she has to modify her hand grips due to rheumatoid arthritis and previous injury/surgery to tendon in left hand. She has not tried a Landscape architect.  She reports left leg numbness from the left leg to foot.  She also has back pain and has a history of back surgery in the 1980s.  She reports problems with left IT band as well.      Written Expression   Handwriting Mild micrographia;Not legible     Vision - History   Baseline Vision Wears glasses all the time   Visual History Macular  degeneration     Cognition   Overall Cognitive Status Within Functional Limits for tasks assessed   Memory Impaired  reports confusion at times, i.e. putting the milk in pantry     Sensation   Light Touch Appears Intact   Hot/Cold Appears Intact   Proprioception Appears Intact   Additional Comments Has some numbness and tingling in both hands for the last couple years.      Coordination   Gross Motor  Movements are Fluid and Coordinated No   Fine Motor Movements are Fluid and Coordinated No   9 Hole Peg Test Right;Left   Right 9 Hole Peg Test 23 sec    Left 9 Hole Peg Test 30 sec     AROM   Overall AROM  Deficits   Overall AROM Comments BUE shoulder flexion actively to 90 degrees of motion, difficulty with reaching the back of the head, WFLs for elbow, wrist and hand for active movement.  Able to perform opposition of thumb to all digits.       Strength   Overall Strength Deficits   Overall Strength Comments 2+/5 BUE, 2+/5 BLE     Hand Function   Right Hand Grip (lbs) 12   Right Hand Lateral Pinch 10 lbs   Right Hand 3 Point Pinch 6 lbs   Left Hand Grip (lbs) 11   Left Hand Lateral Pinch 7 lbs   Left 3 point pinch 5 lbs                  OT Treatments/Exercises (OP) - 01/05/17 1943      Neurological Re-education Exercises   Other Exercises 1 Patient seen for initial instruction on LSVT BIG maximal daily exercises with modifications.  Floor to ceiling exercise from  waist level due to hip precautions, side to side exercise in sitting with moderate verbal cues for form and technique.  In standing, stepping forwards for 5 reps each, increased difficulty with stepping with right foot, shifting weight to left LE.  Stepping to left side, unable to step to right.     Other Exercises 2 Functional mobility skills with instruction and use of bilateral platform walker with CGA and cues for 50 feet for 2 sets.                  OT Education - 01/04/17 2011     Education provided Yes   Education Details maximal daily exercises, use of platform walker   Person(s) Educated Patient   Methods Explanation;Demonstration;Verbal cues   Comprehension Verbal cues required;Returned demonstration;Verbalized understanding          OT Short Term Goals                                                                              OT Long Term Goals - 01/04/17 1012      OT LONG TERM GOAL #1   Title Patient will improve gait speed and endurance and be able to walk 650 feet in 6 minutes to negotiate around the home and community safely in 4 weeks   Baseline 540 feet at eval    Time 4   Period Weeks   Status New     OT LONG TERM GOAL #2   Title Patient will complete HEP for maximal daily exercises with modified independence in 4 weeks   Baseline no current program at eval   Time 4   Period Weeks   Status New     OT LONG TERM GOAL #3   Title Patient will transfer from sit to stand without the use of arms safely and independently from a variety of chairs/surfaces in 4 weeks.  Baseline 5 times sit to stand 38 secs   Time 4   Period Weeks   Status New     OT LONG TERM GOAL #4   Title Patient will complete navigating in narrow spaces without evidence of freezing or hesitations.   Baseline hesitations at eval   Time 4   Period Weeks   Status New     OT LONG TERM GOAL #5   Title Patient will complete shower transfers with modified independence with decreased reported fear of falling.   Baseline Patient with increased fear of falling   Time 4   Period Weeks   Status New               Plan - 01/04/17 2013    Clinical Impression Statement Patient demonstrates difficulty with weight shifting towards left side, difficulty with stepping forwards with right foot and to right side. Patient introduced to bilateral platform walker this date and able to demo use with CGA and cues. Modifications required for all exercises and  only able to complete 5 reps of each exercise for each side.     Rehab Potential Good   Clinical Impairments Affecting Rehab Potential positive:  motivation, family support, negative:  co morbidities, progressive disease processs   OT Frequency 4x / week   OT Duration 4 weeks   OT Treatment/Interventions Self-care/ADL training;DME and/or AE instruction;Patient/family education;Gait Training;Therapeutic exercises;Balance training;Therapeutic exercise;Stair Training;Therapeutic activities;Neuromuscular education;Functional Mobility Training   Consulted and Agree with Plan of Care Patient      Patient will benefit from skilled therapeutic intervention in order to improve the following deficits and impairments:  Abnormal gait, Decreased coordination, Decreased range of motion, Difficulty walking, Impaired flexibility, Decreased endurance, Improper body mechanics, Decreased activity tolerance, Decreased balance, Decreased knowledge of use of DME, Impaired UE functional use, Pain, Decreased mobility, Decreased strength  Visit Diagnosis: Muscle weakness (generalized)  Difficulty in walking, not elsewhere classified  Other lack of coordination  Unsteadiness on feet    Problem List Patient Active Problem List   Diagnosis Date Noted  . History of bilateral hip replacements 05/28/2015  . Spinal stenosis, lumbar region, with neurogenic claudication 05/28/2015  . DDD (degenerative disc disease), lumbar 03/13/2015  . Lumbar post-laminectomy syndrome 03/13/2015  . Degenerative cervical disc 03/13/2015  . Status post bilateral total hip replacement 03/13/2015  . Sacroiliac joint dysfunction 03/13/2015  . History of surgery on upper extremity 03/13/2015  . DJD of shoulder 03/13/2015  . Rheumatoid arthritis ([place]) 02/17/2015   Amy T Lovett, OTR/L, CLT  Lovett,Amy 01/05/2017, 7:50 PM  [place] MAIN [facility] SERVICES [address] [place], [place],  [address] Phone: [phone]   Fax:  [phone]  Name: Yvonne Singleton MRN: [id number] Date of Birth: 08-23-1935

## 2017-01-06 ENCOUNTER — Encounter: Payer: Self-pay | Admitting: Occupational Therapy

## 2017-01-06 ENCOUNTER — Ambulatory Visit: Payer: Medicare Other | Admitting: Occupational Therapy

## 2017-01-06 DIAGNOSIS — M6281 Muscle weakness (generalized): Secondary | ICD-10-CM

## 2017-01-06 DIAGNOSIS — R278 Other lack of coordination: Secondary | ICD-10-CM

## 2017-01-06 DIAGNOSIS — R262 Difficulty in walking, not elsewhere classified: Secondary | ICD-10-CM

## 2017-01-06 DIAGNOSIS — R2681 Unsteadiness on feet: Secondary | ICD-10-CM

## 2017-01-06 NOTE — Therapy (Signed)
[place] MAIN [facility] SERVICES [address] [place], [place], [address] Phone: [phone]   Fax:  [phone]  Occupational Therapy Treatment  Patient Details  Name: Yvonne Singleton MRN: [id number] Date of Birth: 05-10-1935 Referring Provider: Manuella Ghazi  Encounter Date: 01/06/2017      OT End of Session - 01/06/17 1215    Visit Number 3   Number of Visits 17   Date for OT Re-Evaluation 02/14/17   Authorization Type Medicare G code 3   OT Start Time 1059   OT Stop Time 1158   OT Time Calculation (min) 59 min   Activity Tolerance Patient tolerated treatment well   Behavior During Therapy [facility] for tasks assessed/performed      Past Medical History:  Diagnosis Date  . Back pain   . Breast cancer ([place]) 2007   right breast lumpectomy with rad tx  . Cancer [facility]) 2007   right breast  . Gout   . Hard of hearing   . Hypertension   . Parkinson's disease ([place])   . Sleep apnea     Past Surgical History:  Procedure Laterality Date  . ABDOMINAL HYSTERECTOMY    . CATARACT EXTRACTION Bilateral   . EYE SURGERY Bilateral   . HAND SURGERY    . HIP CLOSED REDUCTION Left 06/24/2015   Procedure: CLOSED REDUCTION HIP;  Surgeon: Dereck Leep, MD;  Location: [facility] ORS;  Service: Orthopedics;  Laterality: Left;  . HIP CLOSED REDUCTION Left 02/09/2016   Procedure: CLOSED MANIPULATION HIP;  Surgeon: Earnestine Leys, MD;  Location: [facility] ORS;  Service: Orthopedics;  Laterality: Left;  . HIP SURGERY    . JOINT REPLACEMENT     bilateral hip  . TOTAL KNEE ARTHROPLASTY Bilateral     There were no vitals filed for this visit.      Subjective Assessment - 01/06/17 1213    Subjective  Patient reports she is going to the mountains this weekend with her family to work on sorting through her brother's belongings, hoping it wont snow again.    Pertinent History Patient reports she was diagnosed with Parkinson's disease about 3-4 years ago, her initial  symptoms was right arm and tremors, some slight tremors noted in left arm as well.     Patient Stated Goals Patient reports she would like to be able to do things easier.     Currently in Pain? Yes   Pain Score 3    Pain Location Back   Pain Orientation Lower   Pain Descriptors / Indicators Aching   Pain Type Chronic pain   Pain Onset More than a month ago   Pain Frequency Constant                      OT Treatments/Exercises (OP) - 01/06/17 1218      Neurological Re-education Exercises   Other Exercises 1 Patient seen for initial instructions of LSVT BIG exercises: LSVT Daily Session Maximal Daily Exercises: Sustained movements are designed to rescale the amplitude of movement output for generalization to daily functional activities. Performed as follows for 1 set of 10 repetitions each: Multi directional sustained movements- 1) Floor to ceiling, 2) Side to side. Multi directional Repetitive movements performed in standing and are designed to provide retraining effort needed for sustained muscle activation in tasks Performed as follows: 3) Step and reach forward, 4) Step and Reach Backwards, 5) Step and reach sideways, 6) Rock and reach forward/backward, 7) Rock and  reach sideways. Sit to stand from mat table on lowest setting with cues for weight shift, technique and CGA for 10 reps for 1 set.   All exercises were adapted and performed in parallel bars for safety.  Patient able to incorporate arms in some of the exercises based on balance and ability to weight shift.  Issued written/pictorial adapted exercises along with additional modifications for home and recommend performing with someone at home to assist and use of 2 chairs versus one.    Other Exercises 2 Functional mobility skills with instruction and use of bilateral platform walker with CGA and cues for 100 feet for 1 set with cues for step height and length.                OT Education - 01/06/17 1214     Education provided Yes   Education Details adapted maximal daily exercises, platform walker, weight shifts   Person(s) Educated Patient   Methods Explanation;Demonstration;Verbal cues   Comprehension Verbal cues required;Returned demonstration;Verbalized understanding          OT Short Term Goals - 04/14/15 1303      OT SHORT TERM GOAL #1   Title Decreased edema to w/in 2cm DPC, 0.5 PIP contralateral hand in 0-2 weeks   Status Achieved     OT SHORT TERM GOAL #2   Title PIP joint extension to 0, no flexion contracture   Status Achieved     OT SHORT TERM GOAL #3   Title Passive isolated digital flexionMPs to 50*, PIPs to 60*, DIPs to 20*, active hold exten at 0* all joints in 5 weeks   Status Achieved     OT SHORT TERM GOAL #4   Title No rupture or gapping at repair site, compliance w/ full time orthosis, exchange night and day orthoses, reproduce HEP w/o cues   Status On-going     OT SHORT TERM GOAL #5   Title Iincision scar remodled, radial sensory nerve, dorsal ulnar sensory nerve asymptomatic w/ stretch, tolerate light and deep touch w/ pain <2/10   Status Achieved           OT Long Term Goals - 01/04/17 1012      OT LONG TERM GOAL #1   Title Patient will improve gait speed and endurance and be able to walk 650 feet in 6 minutes to negotiate around the home and community safely in 4 weeks   Baseline 540 feet at eval    Time 4   Period Weeks   Status New     OT LONG TERM GOAL #2   Title Patient will complete HEP for maximal daily exercises with modified independence in 4 weeks   Baseline no current program at eval   Time 4   Period Weeks   Status New     OT LONG TERM GOAL #3   Title Patient will transfer from sit to stand without the use of arms safely and independently from a variety of chairs/surfaces in 4 weeks.   Baseline 5 times sit to stand 38 secs   Time 4   Period Weeks   Status New     OT LONG TERM GOAL #4   Title Patient will complete navigating  in narrow spaces without evidence of freezing or hesitations.   Baseline hesitations at eval   Time 4   Period Weeks   Status New     OT LONG TERM GOAL #5   Title Patient will complete shower transfers with  modified independence with decreased reported fear of falling.   Baseline Patient with increased fear of falling   Time 4   Period Weeks   Status New               Plan - 01/06/17 1215    Clinical Impression Statement Patient's performance improved this date with use of parallel bars to complete exercises, patient required bars bilaterally for most tasks and once her weight was shifted she could release one hand and perform arm movement with the other.  Extensive modifications for patient to participate and complete exercises.  Issued written program this date, adapted version with additional modifications noted.  Recommend use of 2 chairs at home and a person to assist with exercises for balance.  Patient demos understanding and will need additional instruction for proficiency in performance and continual modifications to grade activities as she progresses.    Rehab Potential Good   Clinical Impairments Affecting Rehab Potential positive:  motivation, family support, negative:  co morbidities, progressive disease processs   OT Frequency 4x / week   OT Duration 4 weeks   OT Treatment/Interventions Self-care/ADL training;DME and/or AE instruction;Patient/family education;Gait Training;Therapeutic exercises;Balance training;Therapeutic exercise;Stair Training;Therapeutic activities;Neuromuscular education;Functional Mobility Training      Patient will benefit from skilled therapeutic intervention in order to improve the following deficits and impairments:  Abnormal gait, Decreased coordination, Decreased range of motion, Difficulty walking, Impaired flexibility, Decreased endurance, Improper body mechanics, Decreased activity tolerance, Decreased balance, Decreased knowledge of use  of DME, Impaired UE functional use, Pain, Decreased mobility, Decreased strength  Visit Diagnosis: Muscle weakness (generalized)  Other lack of coordination  Difficulty in walking, not elsewhere classified  Unsteadiness on feet    Problem List Patient Active Problem List   Diagnosis Date Noted  . History of bilateral hip replacements 05/28/2015  . Spinal stenosis, lumbar region, with neurogenic claudication 05/28/2015  . DDD (degenerative disc disease), lumbar 03/13/2015  . Lumbar post-laminectomy syndrome 03/13/2015  . Degenerative cervical disc 03/13/2015  . Status post bilateral total hip replacement 03/13/2015  . Sacroiliac joint dysfunction 03/13/2015  . History of surgery on upper extremity 03/13/2015  . DJD of shoulder 03/13/2015  . Rheumatoid arthritis ([place]) 02/17/2015   Jackee Glasner T Jaylise Peek, OTR/L, CLT  Sherronda Sweigert 01/06/2017, 12:22 PM  [place] MAIN [facility] SERVICES [address] [place], [place], [address] Phone: [phone]   Fax:  [phone]  Name: Yvonne Singleton MRN: [id number] Date of Birth: [birth year]

## 2017-01-10 ENCOUNTER — Ambulatory Visit: Payer: Medicare Other | Admitting: Occupational Therapy

## 2017-01-11 ENCOUNTER — Ambulatory Visit: Payer: Medicare Other | Admitting: Occupational Therapy

## 2017-01-11 DIAGNOSIS — R2681 Unsteadiness on feet: Secondary | ICD-10-CM

## 2017-01-11 DIAGNOSIS — R278 Other lack of coordination: Secondary | ICD-10-CM

## 2017-01-11 DIAGNOSIS — R262 Difficulty in walking, not elsewhere classified: Secondary | ICD-10-CM

## 2017-01-11 DIAGNOSIS — M6281 Muscle weakness (generalized): Secondary | ICD-10-CM | POA: Diagnosis not present

## 2017-01-12 ENCOUNTER — Ambulatory Visit: Payer: Medicare Other | Admitting: Occupational Therapy

## 2017-01-12 DIAGNOSIS — R278 Other lack of coordination: Secondary | ICD-10-CM

## 2017-01-12 DIAGNOSIS — R2681 Unsteadiness on feet: Secondary | ICD-10-CM

## 2017-01-12 DIAGNOSIS — M6281 Muscle weakness (generalized): Secondary | ICD-10-CM | POA: Diagnosis not present

## 2017-01-12 DIAGNOSIS — R262 Difficulty in walking, not elsewhere classified: Secondary | ICD-10-CM

## 2017-01-13 ENCOUNTER — Ambulatory Visit: Payer: Medicare Other | Admitting: Occupational Therapy

## 2017-01-13 DIAGNOSIS — M6281 Muscle weakness (generalized): Secondary | ICD-10-CM

## 2017-01-13 DIAGNOSIS — R262 Difficulty in walking, not elsewhere classified: Secondary | ICD-10-CM

## 2017-01-13 DIAGNOSIS — R278 Other lack of coordination: Secondary | ICD-10-CM

## 2017-01-13 DIAGNOSIS — R2681 Unsteadiness on feet: Secondary | ICD-10-CM

## 2017-01-14 ENCOUNTER — Encounter: Payer: Self-pay | Admitting: Occupational Therapy

## 2017-01-14 NOTE — Therapy (Signed)
[place] MAIN [facility] SERVICES [address] [place], [place], [address] Phone: [phone]   Fax:  [phone]  Occupational Therapy Treatment  Patient Details  Name: Yvonne Singleton MRN: [id number] Date of Birth: 02/17/1935 Referring Provider: Manuella Ghazi  Encounter Date: 01/11/2017      OT End of Session - 01/14/17 1339    Visit Number 4   Number of Visits 17   Date for OT Re-Evaluation 02/14/17   Authorization Type Medicare G code 4   Authorization Time Period 03/18/15   Authorization - Visit Number 12   Authorization - Number of Visits 16   OT Start Time 1055   OT Stop Time 1150   OT Time Calculation (min) 55 min   Activity Tolerance Patient tolerated treatment well   Behavior During Therapy [facility] for tasks assessed/performed      Past Medical History:  Diagnosis Date  . Back pain   . Breast cancer ([place]) 2007   right breast lumpectomy with rad tx  . Cancer [facility]) 2007   right breast  . Gout   . Hard of hearing   . Hypertension   . Parkinson's disease ([place])   . Sleep apnea     Past Surgical History:  Procedure Laterality Date  . ABDOMINAL HYSTERECTOMY    . CATARACT EXTRACTION Bilateral   . EYE SURGERY Bilateral   . HAND SURGERY    . HIP CLOSED REDUCTION Left 06/24/2015   Procedure: CLOSED REDUCTION HIP;  Surgeon: Dereck Leep, MD;  Location: [facility] ORS;  Service: Orthopedics;  Laterality: Left;  . HIP CLOSED REDUCTION Left 02/09/2016   Procedure: CLOSED MANIPULATION HIP;  Surgeon: Earnestine Leys, MD;  Location: [facility] ORS;  Service: Orthopedics;  Laterality: Left;  . HIP SURGERY    . JOINT REPLACEMENT     bilateral hip  . TOTAL KNEE ARTHROPLASTY Bilateral     There were no vitals filed for this visit.      Subjective Assessment - 01/14/17 1337    Subjective  Patient reports she had to cancel yesterday's appointment because she went to the mountains over the weekend and was too fatigued to come to therapy, "we worked alot  on going through my brother's belongings and I was so tired"   Pertinent History Patient reports she was diagnosed with Parkinson's disease about 3-4 years ago, her initial symptoms was right arm and tremors, some slight tremors noted in left arm as well.     Patient Stated Goals Patient reports she would like to be able to do things easier.     Currently in Pain? Yes   Pain Score 3    Pain Location Back   Pain Orientation Lower   Pain Descriptors / Indicators Aching   Pain Type Chronic pain   Pain Onset More than a month ago   Pain Frequency Constant                      OT Treatments/Exercises (OP) - 01/14/17 1341      Neurological Re-education Exercises   Other Exercises 1 Patient seen for instruction of LSVT BIG exercises: LSVT Daily Session Maximal Daily Exercises: Sustained movements are designed to rescale the amplitude of movement output for generalization to daily functional activities. Performed as follows for 1 set of 10 repetitions each: Multi directional sustained movements- 1) Floor to ceiling, 2) Side to side. Multi directional Repetitive movements performed in standing and are designed to provide retraining effort needed  for sustained muscle activation in tasks Performed as follows: 3) Step and reach forward, 4) Step and Reach Backwards, 5) Step and reach sideways, 6) Rock and reach forward/backward, 7) Rock and reach sideways. Sit to stand from mat table on lowest setting with cues for weight shift, technique and CGA for 10 reps for 1 set. All exercises were adapted and performed in parallel bars for safety. Patient able to incorporate arms in select exercises based on balance and ability to weight shift, cues and CGA provided.    Other Exercises 2 Functional mobility this date with use of bilateral crutches per patient preference.  Ambulated 2 trials of 200 feet with cues for amplitude of steps and turning behaviors.                  OT Education -  01/14/17 1339    Education provided Yes   Education Details maximal daily exercises, weight shifting to perform exercises   Person(s) Educated Patient   Methods Explanation;Demonstration;Verbal cues   Comprehension Verbal cues required;Returned demonstration;Verbalized understanding          OT Short Term Goals - 04/14/15 1303      OT SHORT TERM GOAL #1   Title Decreased edema to w/in 2cm DPC, 0.5 PIP contralateral hand in 0-2 weeks   Status Achieved     OT SHORT TERM GOAL #2   Title PIP joint extension to 0, no flexion contracture   Status Achieved     OT SHORT TERM GOAL #3   Title Passive isolated digital flexionMPs to 50*, PIPs to 60*, DIPs to 20*, active hold exten at 0* all joints in 5 weeks   Status Achieved     OT SHORT TERM GOAL #4   Title No rupture or gapping at repair site, compliance w/ full time orthosis, exchange night and day orthoses, reproduce HEP w/o cues   Status On-going     OT SHORT TERM GOAL #5   Title Iincision scar remodled, radial sensory nerve, dorsal ulnar sensory nerve asymptomatic w/ stretch, tolerate light and deep touch w/ pain <2/10   Status Achieved           OT Long Term Goals - 01/04/17 1012      OT LONG TERM GOAL #1   Title Patient will improve gait speed and endurance and be able to walk 650 feet in 6 minutes to negotiate around the home and community safely in 4 weeks   Baseline 540 feet at eval    Time 4   Period Weeks   Status New     OT LONG TERM GOAL #2   Title Patient will complete HEP for maximal daily exercises with modified independence in 4 weeks   Baseline no current program at eval   Time 4   Period Weeks   Status New     OT LONG TERM GOAL #3   Title Patient will transfer from sit to stand without the use of arms safely and independently from a variety of chairs/surfaces in 4 weeks.   Baseline 5 times sit to stand 38 secs   Time 4   Period Weeks   Status New     OT LONG TERM GOAL #4   Title Patient will  complete navigating in narrow spaces without evidence of freezing or hesitations.   Baseline hesitations at eval   Time 4   Period Weeks   Status New     OT LONG TERM GOAL #5   Title  Patient will complete shower transfers with modified independence with decreased reported fear of falling.   Baseline Patient with increased fear of falling   Time 4   Period Weeks   Status New               Plan - 01/14/17 1340    Clinical Impression Statement Patient continued to work towards improving performance and independence with daily exercises, again performed in the parallel bars for safety and use of the bars to assist with weight shifting.  Cues for size of step length during exercises.   Patient has difficulty with shifting weight to left side  in order to unweight right foot for stepping, continue to provide cues and work towards improving this task to assist with functional mobility, balance and performance in daily exercises.   Rehab Potential Good   Clinical Impairments Affecting Rehab Potential positive:  motivation, family support, negative:  co morbidities, progressive disease processs   OT Frequency 4x / week   OT Duration 4 weeks   OT Treatment/Interventions Self-care/ADL training;DME and/or AE instruction;Patient/family education;Gait Training;Therapeutic exercises;Balance training;Therapeutic exercise;Stair Training;Therapeutic activities;Neuromuscular education;Functional Mobility Training   Consulted and Agree with Plan of Care Patient      Patient will benefit from skilled therapeutic intervention in order to improve the following deficits and impairments:  Abnormal gait, Decreased coordination, Decreased range of motion, Difficulty walking, Impaired flexibility, Decreased endurance, Improper body mechanics, Decreased activity tolerance, Decreased balance, Decreased knowledge of use of DME, Impaired UE functional use, Pain, Decreased mobility, Decreased strength  Visit  Diagnosis: Muscle weakness (generalized)  Other lack of coordination  Difficulty in walking, not elsewhere classified  Unsteadiness on feet    Problem List Patient Active Problem List   Diagnosis Date Noted  . History of bilateral hip replacements 05/28/2015  . Spinal stenosis, lumbar region, with neurogenic claudication 05/28/2015  . DDD (degenerative disc disease), lumbar 03/13/2015  . Lumbar post-laminectomy syndrome 03/13/2015  . Degenerative cervical disc 03/13/2015  . Status post bilateral total hip replacement 03/13/2015  . Sacroiliac joint dysfunction 03/13/2015  . History of surgery on upper extremity 03/13/2015  . DJD of shoulder 03/13/2015  . Rheumatoid arthritis ([place]) 02/17/2015   Fidencio Duddy T Charlita Brian, OTR/L, CLT  Italia Wolfert 01/14/2017, 1:49 PM  [place] MAIN [facility] SERVICES [address] [place], [place], [address] Phone: [phone]   Fax:  [phone]  Name: Rajean Desantiago MRN: [id number] Date of Birth: 1935-05-18

## 2017-01-14 NOTE — Therapy (Signed)
[place] MAIN [facility] SERVICES [address] [place], [place], [address] Phone: [phone]   Fax:  [phone]  Occupational Therapy Treatment  Patient Details  Name: Yvonne Singleton MRN: [id number] Date of Birth: [birth year] Referring Provider: Manuella Ghazi  Encounter Date: 01/12/2017      OT End of Session - 01/14/17 1413    Visit Number 5   Number of Visits 17   Date for OT Re-Evaluation 02/14/17   Authorization Type Medicare G code 5   OT Start Time 1100   OT Stop Time 1154   OT Time Calculation (min) 54 min   Activity Tolerance Patient tolerated treatment well   Behavior During Therapy [facility] for tasks assessed/performed      Past Medical History:  Diagnosis Date  . Back pain   . Breast cancer ([place]) 2007   right breast lumpectomy with rad tx  . Cancer [facility]) 2007   right breast  . Gout   . Hard of hearing   . Hypertension   . Parkinson's disease ([place])   . Sleep apnea     Past Surgical History:  Procedure Laterality Date  . ABDOMINAL HYSTERECTOMY    . CATARACT EXTRACTION Bilateral   . EYE SURGERY Bilateral   . HAND SURGERY    . HIP CLOSED REDUCTION Left 06/24/2015   Procedure: CLOSED REDUCTION HIP;  Surgeon: Dereck Leep, MD;  Location: [facility] ORS;  Service: Orthopedics;  Laterality: Left;  . HIP CLOSED REDUCTION Left 02/09/2016   Procedure: CLOSED MANIPULATION HIP;  Surgeon: Earnestine Leys, MD;  Location: [facility] ORS;  Service: Orthopedics;  Laterality: Left;  . HIP SURGERY    . JOINT REPLACEMENT     bilateral hip  . TOTAL KNEE ARTHROPLASTY Bilateral     There were no vitals filed for this visit.      Subjective Assessment - 01/14/17 1411    Subjective  Patient reports she tried to do exercises at home yesterday and used 2 chairs to stand between.  "I know I need to be consistent."   Pertinent History Patient reports she was diagnosed with Parkinson's disease about 3-4 years ago, her initial symptoms was right arm and  tremors, some slight tremors noted in left arm as well.     Patient Stated Goals Patient reports she would like to be able to do things easier.     Currently in Pain? Yes   Pain Score 3    Pain Location Back   Pain Orientation Lower   Pain Descriptors / Indicators Aching   Pain Type Chronic pain   Pain Onset More than a month ago   Pain Frequency Constant                      OT Treatments/Exercises (OP) - 01/14/17 1449      Neurological Re-education Exercises   Other Exercises 1 Patient seen for instruction of LSVT BIG exercises: LSVT Daily Session Maximal Daily Exercises: Sustained movements are designed to rescale the amplitude of movement output for generalization to daily functional activities. Performed as follows for 1 set of 10 repetitions each: Multi directional sustained movements- 1) Floor to ceiling, 2) Side to side. Multi directional Repetitive movements performed in standing and are designed to provide retraining effort needed for sustained muscle activation in tasks Performed as follows: 3) Step and reach forward, 4) Step and Reach Backwards, 5) Step and reach sideways, 6) Rock and reach forward/backward, 7) Rock and reach sideways. Sit  to stand from mat table on lowest setting with cues for weight shift, technique and CGA for 10 reps for 1 set. All exercises were adapted and performed in parallel bars for safety. Patient able to incorporate arms in select exercises based on balance and ability to weight shift, cues and CGA provided.    Other Exercises 2 Functional mobility this date with use of bilateral crutches. Ambulated 1 trials of 400 feet with cues for amplitude of steps and turning behaviors indoors with flat surfaces.                OT Education - 01/14/17 1412    Education provided Yes   Education Details HEP with maximal daily exercises, amplitude of gait.   Person(s) Educated Patient   Methods Explanation;Demonstration;Verbal cues    Comprehension Verbal cues required;Returned demonstration;Verbalized understanding          OT Short Term Goals - 04/14/15 1303      OT SHORT TERM GOAL #1   Title Decreased edema to w/in 2cm DPC, 0.5 PIP contralateral hand in 0-2 weeks   Status Achieved     OT SHORT TERM GOAL #2   Title PIP joint extension to 0, no flexion contracture   Status Achieved     OT SHORT TERM GOAL #3   Title Passive isolated digital flexionMPs to 50*, PIPs to 60*, DIPs to 20*, active hold exten at 0* all joints in 5 weeks   Status Achieved     OT SHORT TERM GOAL #4   Title No rupture or gapping at repair site, compliance w/ full time orthosis, exchange night and day orthoses, reproduce HEP w/o cues   Status On-going     OT SHORT TERM GOAL #5   Title Iincision scar remodled, radial sensory nerve, dorsal ulnar sensory nerve asymptomatic w/ stretch, tolerate light and deep touch w/ pain <2/10   Status Achieved           OT Long Term Goals - 01/04/17 1012      OT LONG TERM GOAL #1   Title Patient will improve gait speed and endurance and be able to walk 650 feet in 6 minutes to negotiate around the home and community safely in 4 weeks   Baseline 540 feet at eval    Time 4   Period Weeks   Status New     OT LONG TERM GOAL #2   Title Patient will complete HEP for maximal daily exercises with modified independence in 4 weeks   Baseline no current program at eval   Time 4   Period Weeks   Status New     OT LONG TERM GOAL #3   Title Patient will transfer from sit to stand without the use of arms safely and independently from a variety of chairs/surfaces in 4 weeks.   Baseline 5 times sit to stand 38 secs   Time 4   Period Weeks   Status New     OT LONG TERM GOAL #4   Title Patient will complete navigating in narrow spaces without evidence of freezing or hesitations.   Baseline hesitations at eval   Time 4   Period Weeks   Status New     OT LONG TERM GOAL #5   Title Patient will  complete shower transfers with modified independence with decreased reported fear of falling.   Baseline Patient with increased fear of falling   Time 4   Period Weeks   Status New  Plan - 01/14/17 1413    Clinical Impression Statement Patient continues to require the use of the parallel bars for balance during maximal daily exercises and is working towards becoming more consistent with exercise performance at home with use of 2 chairs and family member to stand next to her for safety.  She continues to demo difficulty with shifting weight to left side and stepping with right foot, often drags foot forwards and back if not provided with cues from therapist.    Rehab Potential Good   Clinical Impairments Affecting Rehab Potential positive:  motivation, family support, negative:  co morbidities, progressive disease processs   OT Frequency 4x / week   OT Duration 4 weeks   OT Treatment/Interventions Self-care/ADL training;DME and/or AE instruction;Patient/family education;Gait Training;Therapeutic exercises;Balance training;Therapeutic exercise;Stair Training;Therapeutic activities;Neuromuscular education;Functional Mobility Training   Consulted and Agree with Plan of Care Patient      Patient will benefit from skilled therapeutic intervention in order to improve the following deficits and impairments:  Abnormal gait, Decreased coordination, Decreased range of motion, Difficulty walking, Impaired flexibility, Decreased endurance, Improper body mechanics, Decreased activity tolerance, Decreased balance, Decreased knowledge of use of DME, Impaired UE functional use, Pain, Decreased mobility, Decreased strength  Visit Diagnosis: Muscle weakness (generalized)  Other lack of coordination  Difficulty in walking, not elsewhere classified  Unsteadiness on feet    Problem List Patient Active Problem List   Diagnosis Date Noted  . History of bilateral hip replacements  05/28/2015  . Spinal stenosis, lumbar region, with neurogenic claudication 05/28/2015  . DDD (degenerative disc disease), lumbar 03/13/2015  . Lumbar post-laminectomy syndrome 03/13/2015  . Degenerative cervical disc 03/13/2015  . Status post bilateral total hip replacement 03/13/2015  . Sacroiliac joint dysfunction 03/13/2015  . History of surgery on upper extremity 03/13/2015  . DJD of shoulder 03/13/2015  . Rheumatoid arthritis ([place]) 02/17/2015   Syann Cupples T Cacie Gaskins, OTR/L, CLT  Cyd Hostler 01/14/2017, 2:53 PM  [place] MAIN [facility] SERVICES [address] [place], [place], [address] Phone: [phone]   Fax:  [phone]  Name: Yvonne Singleton MRN: [id number] Date of Birth: April 24, 1935

## 2017-01-14 NOTE — Therapy (Signed)
[place] MAIN [facility] SERVICES [address] [place], [place], [address] Phone: [phone]   Fax:  [phone]  Occupational Therapy Treatment  Patient Details  Name: Yvonne Singleton MRN: [id number] Date of Birth: 07/21/1935 Referring Provider: Manuella Ghazi  Encounter Date: 01/13/2017      OT End of Session - 01/14/17 1459    Visit Number 6   Number of Visits 17   Date for OT Re-Evaluation 02/14/17   Authorization Type Medicare G code 6   OT Start Time 1100   OT Stop Time 1155   OT Time Calculation (min) 55 min   Activity Tolerance Patient tolerated treatment well   Behavior During Therapy [facility] for tasks assessed/performed      Past Medical History:  Diagnosis Date  . Back pain   . Breast cancer ([place]) 2007   right breast lumpectomy with rad tx  . Cancer [facility]) 2007   right breast  . Gout   . Hard of hearing   . Hypertension   . Parkinson's disease ([place])   . Sleep apnea     Past Surgical History:  Procedure Laterality Date  . ABDOMINAL HYSTERECTOMY    . CATARACT EXTRACTION Bilateral   . EYE SURGERY Bilateral   . HAND SURGERY    . HIP CLOSED REDUCTION Left 06/24/2015   Procedure: CLOSED REDUCTION HIP;  Surgeon: Dereck Leep, MD;  Location: [facility] ORS;  Service: Orthopedics;  Laterality: Left;  . HIP CLOSED REDUCTION Left 02/09/2016   Procedure: CLOSED MANIPULATION HIP;  Surgeon: Earnestine Leys, MD;  Location: [facility] ORS;  Service: Orthopedics;  Laterality: Left;  . HIP SURGERY    . JOINT REPLACEMENT     bilateral hip  . TOTAL KNEE ARTHROPLASTY Bilateral     There were no vitals filed for this visit.      Subjective Assessment - 01/14/17 1458    Subjective  Patient reports she will have some company this weekend for Easter with her family, she is unable to cook a big meal any longer and is glad her daughter has suggested something simple.    Pertinent History Patient reports she was diagnosed with Parkinson's disease about 3-4  years ago, her initial symptoms was right arm and tremors, some slight tremors noted in left arm as well.     Patient Stated Goals Patient reports she would like to be able to do things easier.     Currently in Pain? Yes   Pain Score 4    Pain Location Back   Pain Orientation Lower   Pain Descriptors / Indicators Aching   Pain Type Chronic pain   Pain Onset More than a month ago   Pain Frequency Constant                      OT Treatments/Exercises (OP) - 01/14/17 1531      Neurological Re-education Exercises   Other Exercises 1 Patient seen for instruction of LSVT BIG exercises: LSVT Daily Session Maximal Daily Exercises: Sustained movements are designed to rescale the amplitude of movement output for generalization to daily functional activities. Performed as follows for 1 set of 10 repetitions each: Multi directional sustained movements- 1) Floor to ceiling, 2) Side to side. Multi directional Repetitive movements performed in standing and are designed to provide retraining effort needed for sustained muscle activation in tasks Performed as follows: 3) Step and reach forward, 4) Step and Reach Backwards, 5) Step and reach sideways, 6) Rock  and reach forward/backward, 7) Rock and reach sideways. Sit to stand from mat table on lowest setting with cues for weight shift, technique and CGA for 10 reps for 1 set. All exercises were adapted and performed in parallel bars for safety. Patient able to incorporate arms in select exercises based on balance and ability to weight shift, cues and CGA provided.    Other Exercises 2 Functional mobility this date with use of bilateral crutches. Ambulated 2 trials of 350 feet with cues for amplitude of steps and turning behaviors indoors with flat surfaces with one short rest break between trials.                 OT Education - 01/14/17 1459    Education provided Yes   Education Details HEP   Person(s) Educated Patient   Methods  Explanation;Demonstration;Verbal cues   Comprehension Verbal cues required;Returned demonstration;Verbalized understanding          OT Short Term Goals - 04/14/15 1303      OT SHORT TERM GOAL #1   Title Decreased edema to w/in 2cm DPC, 0.5 PIP contralateral hand in 0-2 weeks   Status Achieved     OT SHORT TERM GOAL #2   Title PIP joint extension to 0, no flexion contracture   Status Achieved     OT SHORT TERM GOAL #3   Title Passive isolated digital flexionMPs to 50*, PIPs to 60*, DIPs to 20*, active hold exten at 0* all joints in 5 weeks   Status Achieved     OT SHORT TERM GOAL #4   Title No rupture or gapping at repair site, compliance w/ full time orthosis, exchange night and day orthoses, reproduce HEP w/o cues   Status On-going     OT SHORT TERM GOAL #5   Title Iincision scar remodled, radial sensory nerve, dorsal ulnar sensory nerve asymptomatic w/ stretch, tolerate light and deep touch w/ pain <2/10   Status Achieved           OT Long Term Goals - 01/04/17 1012      OT LONG TERM GOAL #1   Title Patient will improve gait speed and endurance and be able to walk 650 feet in 6 minutes to negotiate around the home and community safely in 4 weeks   Baseline 540 feet at eval    Time 4   Period Weeks   Status New     OT LONG TERM GOAL #2   Title Patient will complete HEP for maximal daily exercises with modified independence in 4 weeks   Baseline no current program at eval   Time 4   Period Weeks   Status New     OT LONG TERM GOAL #3   Title Patient will transfer from sit to stand without the use of arms safely and independently from a variety of chairs/surfaces in 4 weeks.   Baseline 5 times sit to stand 38 secs   Time 4   Period Weeks   Status New     OT LONG TERM GOAL #4   Title Patient will complete navigating in narrow spaces without evidence of freezing or hesitations.   Baseline hesitations at eval   Time 4   Period Weeks   Status New     OT LONG  TERM GOAL #5   Title Patient will complete shower transfers with modified independence with decreased reported fear of falling.   Baseline Patient with increased fear of falling   Time 4  Period Weeks   Status New               Plan - 01/14/17 1500    Clinical Impression Statement Patient spent this week working towards performance of maximal daily exercises with modifications due to issues wtih balance and poor ability to weight shift.  She has not been able to progress to adding functional component tasks this week however, will plan to add tasks next week since patient is becoming more familiar with exercises and requiring decreased rest breaks.  She prefers to use her crutches for functional mobility tasks despite attempts at using bilateral platform walker.     Rehab Potential Good   Clinical Impairments Affecting Rehab Potential positive:  motivation, family support, negative:  co morbidities, progressive disease processs   OT Frequency 4x / week   OT Duration 4 weeks   OT Treatment/Interventions Self-care/ADL training;DME and/or AE instruction;Patient/family education;Gait Training;Therapeutic exercises;Balance training;Therapeutic exercise;Stair Training;Therapeutic activities;Neuromuscular education;Functional Mobility Training   Consulted and Agree with Plan of Care Patient      Patient will benefit from skilled therapeutic intervention in order to improve the following deficits and impairments:  Abnormal gait, Decreased coordination, Decreased range of motion, Difficulty walking, Impaired flexibility, Decreased endurance, Improper body mechanics, Decreased activity tolerance, Decreased balance, Decreased knowledge of use of DME, Impaired UE functional use, Pain, Decreased mobility, Decreased strength  Visit Diagnosis: Muscle weakness (generalized)  Other lack of coordination  Unsteadiness on feet  Difficulty in walking, not elsewhere classified    Problem  List Patient Active Problem List   Diagnosis Date Noted  . History of bilateral hip replacements 05/28/2015  . Spinal stenosis, lumbar region, with neurogenic claudication 05/28/2015  . DDD (degenerative disc disease), lumbar 03/13/2015  . Lumbar post-laminectomy syndrome 03/13/2015  . Degenerative cervical disc 03/13/2015  . Status post bilateral total hip replacement 03/13/2015  . Sacroiliac joint dysfunction 03/13/2015  . History of surgery on upper extremity 03/13/2015  . DJD of shoulder 03/13/2015  . Rheumatoid arthritis ([place]) 02/17/2015   Amy T Lovett, OTR/L, CLT  Lovett,Amy 01/14/2017, 3:35 PM  [place] MAIN [facility] SERVICES [address] [place], [place], [address] Phone: [phone]   Fax:  [phone]  Name: Yvonne Singleton MRN: [id number] Date of Birth: 06-Jun-1935

## 2017-01-17 ENCOUNTER — Ambulatory Visit: Payer: Medicare Other | Attending: Neurology | Admitting: Occupational Therapy

## 2017-01-17 DIAGNOSIS — R262 Difficulty in walking, not elsewhere classified: Secondary | ICD-10-CM

## 2017-01-17 DIAGNOSIS — M6281 Muscle weakness (generalized): Secondary | ICD-10-CM

## 2017-01-17 DIAGNOSIS — R278 Other lack of coordination: Secondary | ICD-10-CM | POA: Insufficient documentation

## 2017-01-17 DIAGNOSIS — R2681 Unsteadiness on feet: Secondary | ICD-10-CM | POA: Diagnosis present

## 2017-01-18 ENCOUNTER — Ambulatory Visit: Payer: Medicare Other | Admitting: Occupational Therapy

## 2017-01-18 DIAGNOSIS — M6281 Muscle weakness (generalized): Secondary | ICD-10-CM

## 2017-01-18 DIAGNOSIS — R278 Other lack of coordination: Secondary | ICD-10-CM

## 2017-01-18 DIAGNOSIS — R262 Difficulty in walking, not elsewhere classified: Secondary | ICD-10-CM | POA: Diagnosis not present

## 2017-01-19 ENCOUNTER — Ambulatory Visit: Payer: Medicare Other | Admitting: Occupational Therapy

## 2017-01-19 DIAGNOSIS — M6281 Muscle weakness (generalized): Secondary | ICD-10-CM

## 2017-01-19 DIAGNOSIS — R262 Difficulty in walking, not elsewhere classified: Secondary | ICD-10-CM | POA: Diagnosis not present

## 2017-01-19 DIAGNOSIS — R278 Other lack of coordination: Secondary | ICD-10-CM

## 2017-01-20 ENCOUNTER — Ambulatory Visit: Payer: Medicare Other | Admitting: Occupational Therapy

## 2017-01-23 ENCOUNTER — Encounter: Payer: Self-pay | Admitting: Occupational Therapy

## 2017-01-23 NOTE — Therapy (Signed)
[place] MAIN [facility] SERVICES [address] [place], [place], [address] Phone: [phone]   Fax:  [phone]  Occupational Therapy Treatment  Patient Details  Name: Yvonne Singleton MRN: [id number] Date of Birth: 10/18/1935 Referring Provider: Manuella Ghazi  Encounter Date: 01/19/2017      OT End of Session - 01/23/17 1357    Visit Number 9   Number of Visits 17   Date for OT Re-Evaluation 02/14/17   Authorization Type Medicare G code 9   OT Start Time 1100   OT Stop Time 1156   OT Time Calculation (min) 56 min   Activity Tolerance Patient tolerated treatment well   Behavior During Therapy [facility] for tasks assessed/performed      Past Medical History:  Diagnosis Date  . Back pain   . Breast cancer ([place]) 2007   right breast lumpectomy with rad tx  . Cancer [facility]) 2007   right breast  . Gout   . Hard of hearing   . Hypertension   . Parkinson's disease ([place])   . Sleep apnea     Past Surgical History:  Procedure Laterality Date  . ABDOMINAL HYSTERECTOMY    . CATARACT EXTRACTION Bilateral   . EYE SURGERY Bilateral   . HAND SURGERY    . HIP CLOSED REDUCTION Left 06/24/2015   Procedure: CLOSED REDUCTION HIP;  Surgeon: Dereck Leep, MD;  Location: [facility] ORS;  Service: Orthopedics;  Laterality: Left;  . HIP CLOSED REDUCTION Left 02/09/2016   Procedure: CLOSED MANIPULATION HIP;  Surgeon: Earnestine Leys, MD;  Location: [facility] ORS;  Service: Orthopedics;  Laterality: Left;  . HIP SURGERY    . JOINT REPLACEMENT     bilateral hip  . TOTAL KNEE ARTHROPLASTY Bilateral     There were no vitals filed for this visit.      Subjective Assessment - 01/23/17 1355    Subjective  Patient reports she told her husband she feels like she is getting better and is walking better than she has been.     Pertinent History Patient reports she was diagnosed with Parkinson's disease about 3-4 years ago, her initial symptoms was right arm and tremors, some slight  tremors noted in left arm as well.     Patient Stated Goals Patient reports she would like to be able to do things easier.     Currently in Pain? Yes   Pain Score 4    Pain Location Back   Pain Orientation Lower   Pain Descriptors / Indicators Aching   Pain Type Chronic pain   Pain Onset More than a month ago   Pain Frequency Constant                      OT Treatments/Exercises (OP) - 01/23/17 1523      ADLs   ADL Comments Patient seen for 5 repetitions each of functional component tasks 1)reaching to back of the head to perform hair care, 2) buttoning and unbuttoning buttons with medium size and cues, stepping up and down from curb (moderate assist and verbal cues), sit to stand with CGA and cues, and directional turning with cues and CGA during functional mobility tasks.     Neurological Re-education Exercises   Other Exercises 1 Patient seen for instruction of LSVT BIG exercises: LSVT Daily Session Maximal Daily Exercises: Sustained movements are designed to rescale the amplitude of movement output for generalization to daily functional activities. Performed as follows for 1 set  of 10 repetitions each: Multi directional sustained movements- 1) Floor to ceiling, 2) Side to side. Multi directional Repetitive movements performed in standing and are designed to provide retraining effort needed for sustained muscle activation in tasks Performed as follows: 3) Step and reach forward, 4) Step and Reach Backwards, 5) Step and reach sideways, 6) Rock and reach forward/backward, 7) Rock and reach sideways. Sit to stand from mat table on lowest setting with cues for weight shift, technique and CGA for 10 reps for 1 set. All exercises were adapted and performed in parallel bars for safety, patient using 2 chairs at home to complete. Patient continues to progress with use of arms in select exercises based on balance and ability to weight shift, cues and CGA provided within parallel bars.    Other Exercises 2 Functional mobility skills 2 trials of 300 feet with cues for step length, turning behaviors and crutch use.                OT Education - 01/23/17 1356    Education provided Yes   Education Details functional component tasks, incorporating into hierarchy tasks, maximal daily exercises.    Person(s) Educated Patient   Methods Explanation;Demonstration;Verbal cues   Comprehension Verbal cues required;Returned demonstration;Verbalized understanding          OT Short Term Goals - 04/14/15 1303      OT SHORT TERM GOAL #1   Title Decreased edema to w/in 2cm DPC, 0.5 PIP contralateral hand in 0-2 weeks   Status Achieved     OT SHORT TERM GOAL #2   Title PIP joint extension to 0, no flexion contracture   Status Achieved     OT SHORT TERM GOAL #3   Title Passive isolated digital flexionMPs to 50*, PIPs to 60*, DIPs to 20*, active hold exten at 0* all joints in 5 weeks   Status Achieved     OT SHORT TERM GOAL #4   Title No rupture or gapping at repair site, compliance w/ full time orthosis, exchange night and day orthoses, reproduce HEP w/o cues   Status On-going     OT SHORT TERM GOAL #5   Title Iincision scar remodled, radial sensory nerve, dorsal ulnar sensory nerve asymptomatic w/ stretch, tolerate light and deep touch w/ pain <2/10   Status Achieved           OT Long Term Goals - 01/04/17 1012      OT LONG TERM GOAL #1   Title Patient will improve gait speed and endurance and be able to walk 650 feet in 6 minutes to negotiate around the home and community safely in 4 weeks   Baseline 540 feet at eval    Time 4   Period Weeks   Status New     OT LONG TERM GOAL #2   Title Patient will complete HEP for maximal daily exercises with modified independence in 4 weeks   Baseline no current program at eval   Time 4   Period Weeks   Status New     OT LONG TERM GOAL #3   Title Patient will transfer from sit to stand without the use of arms safely  and independently from a variety of chairs/surfaces in 4 weeks.   Baseline 5 times sit to stand 38 secs   Time 4   Period Weeks   Status New     OT LONG TERM GOAL #4   Title Patient will complete navigating in narrow spaces without  evidence of freezing or hesitations.   Baseline hesitations at eval   Time 4   Period Weeks   Status New     OT LONG TERM GOAL #5   Title Patient will complete shower transfers with modified independence with decreased reported fear of falling.   Baseline Patient with increased fear of falling   Time 4   Period Weeks   Status New               Plan - 01/23/17 1358    Clinical Impression Statement Patient becoming more consistent with maximal daily exercises in parallel bars, she continues to require both bars to hold while stepping and can then use one hand to perform the arm movement portion of the exercise.  She is starting to indentify mistakes in performance and able to correct with minimal cues.  Continue to work towards goals to increase independence in daily tasks and improve safety and balance with self care/functional mobility.    Rehab Potential Good   Clinical Impairments Affecting Rehab Potential positive:  motivation, family support, negative:  co morbidities, progressive disease processs   OT Frequency 4x / week   OT Duration 4 weeks   OT Treatment/Interventions Self-care/ADL training;DME and/or AE instruction;Patient/family education;Gait Training;Therapeutic exercises;Balance training;Therapeutic exercise;Stair Training;Therapeutic activities;Neuromuscular education;Functional Mobility Training   Consulted and Agree with Plan of Care Patient      Patient will benefit from skilled therapeutic intervention in order to improve the following deficits and impairments:  Abnormal gait, Decreased coordination, Decreased range of motion, Difficulty walking, Impaired flexibility, Decreased endurance, Improper body mechanics, Decreased activity  tolerance, Decreased balance, Decreased knowledge of use of DME, Impaired UE functional use, Pain, Decreased mobility, Decreased strength  Visit Diagnosis: Muscle weakness (generalized)  Other lack of coordination    Problem List Patient Active Problem List   Diagnosis Date Noted  . History of bilateral hip replacements 05/28/2015  . Spinal stenosis, lumbar region, with neurogenic claudication 05/28/2015  . DDD (degenerative disc disease), lumbar 03/13/2015  . Lumbar post-laminectomy syndrome 03/13/2015  . Degenerative cervical disc 03/13/2015  . Status post bilateral total hip replacement 03/13/2015  . Sacroiliac joint dysfunction 03/13/2015  . History of surgery on upper extremity 03/13/2015  . DJD of shoulder 03/13/2015  . Rheumatoid arthritis ([place]) 02/17/2015   Juliah Scadden T Susan Bleich, OTR/L, CLT  Coran Dipaola 01/23/2017, 3:27 PM  [place] MAIN [facility] SERVICES [address] [place], [place], [address] Phone: [phone]   Fax:  [phone]  Name: Yvonne Singleton MRN: [id number] Date of Birth: [birth year]

## 2017-01-23 NOTE — Therapy (Signed)
[place] MAIN [facility] SERVICES [address] [place], [place], [address] Phone: [phone]   Fax:  [phone]  Occupational Therapy Treatment  Patient Details  Name: Yvonne Singleton MRN: [id number] Date of Birth: 04/23/1935 Referring Provider: Manuella Ghazi  Encounter Date: 01/17/2017      OT End of Session - 01/23/17 1135    Visit Number 7   Number of Visits 17   Date for OT Re-Evaluation 02/14/17   Authorization Type Medicare G code 6   OT Start Time 1100   OT Stop Time 1154   OT Time Calculation (min) 54 min   Activity Tolerance Patient tolerated treatment well   Behavior During Therapy [facility] for tasks assessed/performed      Past Medical History:  Diagnosis Date  . Back pain   . Breast cancer ([place]) 2007   right breast lumpectomy with rad tx  . Cancer [facility]) 2007   right breast  . Gout   . Hard of hearing   . Hypertension   . Parkinson's disease ([place])   . Sleep apnea     Past Surgical History:  Procedure Laterality Date  . ABDOMINAL HYSTERECTOMY    . CATARACT EXTRACTION Bilateral   . EYE SURGERY Bilateral   . HAND SURGERY    . HIP CLOSED REDUCTION Left 06/24/2015   Procedure: CLOSED REDUCTION HIP;  Surgeon: Dereck Leep, MD;  Location: [facility] ORS;  Service: Orthopedics;  Laterality: Left;  . HIP CLOSED REDUCTION Left 02/09/2016   Procedure: CLOSED MANIPULATION HIP;  Surgeon: Earnestine Leys, MD;  Location: [facility] ORS;  Service: Orthopedics;  Laterality: Left;  . HIP SURGERY    . JOINT REPLACEMENT     bilateral hip  . TOTAL KNEE ARTHROPLASTY Bilateral     There were no vitals filed for this visit.      Subjective Assessment - 01/23/17 1128    Subjective  Patient states she had a good weekend, had her family over and they cooked a lasagna and salad for Easter.  Her daughter helped to prepare all of the meal.  She was able to do exercises over the weekend.    Pertinent History Patient reports she was diagnosed with Parkinson's  disease about 3-4 years ago, her initial symptoms was right arm and tremors, some slight tremors noted in left arm as well.     Patient Stated Goals Patient reports she would like to be able to do things easier.     Currently in Pain? Yes   Pain Score 4    Pain Location Back   Pain Orientation Lower   Pain Descriptors / Indicators Aching   Pain Type Chronic pain   Pain Onset More than a month ago   Pain Frequency Constant   Multiple Pain Sites No                      OT Treatments/Exercises (OP) - 01/23/17 1131      ADLs   ADL Comments Patient seen for functional component tasks of:  reaching to back of the head to perform hair care, buttoning and unbuttoning buttons, stepping up and down from curb (moderate assist), sit to stand with CGA and cues, and directional turning with cues and CGA.  All performed for 5 reps each.       Neurological Re-education Exercises   Other Exercises 1 Patient seen for instruction of LSVT BIG exercises: LSVT Daily Session Maximal Daily Exercises: Sustained movements are designed to  rescale the amplitude of movement output for generalization to daily functional activities. Performed as follows for 1 set of 10 repetitions each: Multi directional sustained movements- 1) Floor to ceiling, 2) Side to side. Multi directional Repetitive movements performed in standing and are designed to provide retraining effort needed for sustained muscle activation in tasks Performed as follows: 3) Step and reach forward, 4) Step and Reach Backwards, 5) Step and reach sideways, 6) Rock and reach forward/backward, 7) Rock and reach sideways. Sit to stand from mat table on lowest setting with cues for weight shift, technique and CGA for 10 reps for 1 set. All exercises were adapted and performed in parallel bars for safety. Patient able to incorporate arms in select exercises based on balance and ability to weight shift, cues and CGA provided.    Other Exercises 2  Functional mobility in hallway for 4 trials of 100 feet with use of bilateral crutches and cues for step length and posture.                 OT Education - 01/23/17 1135    Education provided Yes   Education Details amplitude of gait, turning behaviors, weight shifting   Person(s) Educated Patient   Methods Explanation;Demonstration;Verbal cues   Comprehension Verbal cues required;Verbalized understanding;Returned demonstration                     OT Long Term Goals - 01/04/17 1012      OT LONG TERM GOAL #1   Title Patient will improve gait speed and endurance and be able to walk 650 feet in 6 minutes to negotiate around the home and community safely in 4 weeks   Baseline 540 feet at eval    Time 4   Period Weeks   Status New     OT LONG TERM GOAL #2   Title Patient will complete HEP for maximal daily exercises with modified independence in 4 weeks   Baseline no current program at eval   Time 4   Period Weeks   Status New     OT LONG TERM GOAL #3   Title Patient will transfer from sit to stand without the use of arms safely and independently from a variety of chairs/surfaces in 4 weeks.   Baseline 5 times sit to stand 38 secs   Time 4   Period Weeks   Status New     OT LONG TERM GOAL #4   Title Patient will complete navigating in narrow spaces without evidence of freezing or hesitations.   Baseline hesitations at eval   Time 4   Period Weeks   Status New     OT LONG TERM GOAL #5   Title Patient will complete shower transfers with modified independence with decreased reported fear of falling.   Baseline Patient with increased fear of falling   Time 4   Period Weeks   Status New               Plan - 01/23/17 1136    Clinical Impression Statement Patient continues to demonstrate difficulty with weight shifting to be able to step in multi directions, she requires parallel bars to hold in order to shift weight especially when stepping towards  the right side.  She demonstrates a shorter step height but has improved with step length.  She prefers her crutches rather than platform walker. Continue to work towards goals to increase independence in daily tasks.   Rehab Potential Good  Clinical Impairments Affecting Rehab Potential positive:  motivation, family support, negative:  co morbidities, progressive disease processs   OT Frequency 4x / week   OT Duration 4 weeks   OT Treatment/Interventions Self-care/ADL training;DME and/or AE instruction;Patient/family education;Gait Training;Therapeutic exercises;Balance training;Therapeutic exercise;Stair Training;Therapeutic activities;Neuromuscular education;Functional Mobility Training   Consulted and Agree with Plan of Care Patient      Patient will benefit from skilled therapeutic intervention in order to improve the following deficits and impairments:  Abnormal gait, Decreased coordination, Decreased range of motion, Difficulty walking, Impaired flexibility, Decreased endurance, Improper body mechanics, Decreased activity tolerance, Decreased balance, Decreased knowledge of use of DME, Impaired UE functional use, Pain, Decreased mobility, Decreased strength  Visit Diagnosis: Difficulty in walking, not elsewhere classified  Muscle weakness (generalized)  Other lack of coordination  Unsteadiness on feet    Problem List Patient Active Problem List   Diagnosis Date Noted  . History of bilateral hip replacements 05/28/2015  . Spinal stenosis, lumbar region, with neurogenic claudication 05/28/2015  . DDD (degenerative disc disease), lumbar 03/13/2015  . Lumbar post-laminectomy syndrome 03/13/2015  . Degenerative cervical disc 03/13/2015  . Status post bilateral total hip replacement 03/13/2015  . Sacroiliac joint dysfunction 03/13/2015  . History of surgery on upper extremity 03/13/2015  . DJD of shoulder 03/13/2015  . Rheumatoid arthritis ([place]) 02/17/2015   Amy T Lovett,  OTR/L, CLT  Lovett,Amy 01/23/2017, 11:39 AM  [place] MAIN [facility] SERVICES [address] [place], [place], [address] Phone: [phone]   Fax:  [phone]  Name: Yvonne Singleton MRN: [id number] Date of Birth: 1935/03/09

## 2017-01-23 NOTE — Therapy (Signed)
[name] [facility] SERVICES [address] [place], [place], [address] Phone: [phone]   Fax:  [phone]  Occupational Therapy Treatment  Patient Details  Name: Yvonne Singleton MRN: [id number] Date of Birth: 05/10/1935 Referring Provider: Manuella Ghazi  Encounter Date: 01/18/2017      OT End of Session - 01/23/17 1302    Visit Number 8   Number of Visits 17   Date for OT Re-Evaluation 02/14/17   Authorization Type Medicare G code 8   OT Start Time 1100   OT Stop Time 1155   OT Time Calculation (min) 55 min   Activity Tolerance Patient tolerated treatment well   Behavior During Therapy [facility] for tasks assessed/performed      Past Medical History:  Diagnosis Date  . Back pain   . Breast cancer ([place]) 2007   right breast lumpectomy with rad tx  . Cancer [facility]) 2007   right breast  . Gout   . Hard of hearing   . Hypertension   . Parkinson's disease ([place])   . Sleep apnea     Past Surgical History:  Procedure Laterality Date  . ABDOMINAL HYSTERECTOMY    . CATARACT EXTRACTION Bilateral   . EYE SURGERY Bilateral   . HAND SURGERY    . HIP CLOSED REDUCTION Left 06/24/2015   Procedure: CLOSED REDUCTION HIP;  Surgeon: Dereck Leep, MD;  Location: [facility] ORS;  Service: Orthopedics;  Laterality: Left;  . HIP CLOSED REDUCTION Left 02/09/2016   Procedure: CLOSED MANIPULATION HIP;  Surgeon: Earnestine Leys, MD;  Location: [facility] ORS;  Service: Orthopedics;  Laterality: Left;  . HIP SURGERY    . JOINT REPLACEMENT     bilateral hip  . TOTAL KNEE ARTHROPLASTY Bilateral     There were no vitals filed for this visit.      Subjective Assessment - 01/23/17 1255    Subjective  Patient reports she is doing well, feels her steps are getting bigger and reports she is feeling more confident in performing her exercises.     Patient is accompained by: Family member   Pertinent History Patient reports she was diagnosed with Parkinson's disease about 3-4  years ago, her initial symptoms was right arm and tremors, some slight tremors noted in left arm as well.     Patient Stated Goals Patient reports she would like to be able to do things easier.     Currently in Pain? Yes   Pain Score 5    Pain Location Back   Pain Orientation Lower   Pain Descriptors / Indicators Aching   Pain Type Chronic pain   Pain Onset More than a month ago   Pain Frequency Constant                      OT Treatments/Exercises (OP) - 01/23/17 1256      ADLs   ADL Comments Patient seen for 5 repetitions each of functional component tasks 1)reaching to back of the head to perform hair care, 2) buttoning and unbuttoning buttons with medium size and cues, stepping up and down from curb (moderate assist and verbal cues), sit to stand with CGA and cues, and directional turning with cues and CGA during functional mobility tasks.     Neurological Re-education Exercises   Other Exercises 1 Patient seen for instruction of LSVT BIG exercises: LSVT Daily Session Maximal Daily Exercises: Sustained movements are designed to rescale the amplitude of movement output for generalization  to daily functional activities. Performed as follows for 1 set of 10 repetitions each: Multi directional sustained movements- 1) Floor to ceiling, 2) Side to side. Multi directional Repetitive movements performed in standing and are designed to provide retraining effort needed for sustained muscle activation in tasks Performed as follows: 3) Step and reach forward, 4) Step and Reach Backwards, 5) Step and reach sideways, 6) Rock and reach forward/backward, 7) Rock and reach sideways. Sit to stand from mat table on lowest setting with cues for weight shift, technique and CGA for 10 reps for 1 set. All exercises were adapted and performed in parallel bars for safety, patient using 2 chairs at home to complete. Patient continues to progress with use of arms in select exercises based on balance and  ability to weight shift, cues and CGA provided within parallel bars.   Other Exercises 2 Focus on functional mobility tasks with use of crutches for 2 sets of 250 feet for 2 trials and one short rest break required.   Cues for step length and reciprocal arm crutch swing/placement.                OT Education - 01/23/17 1302    Education provided Yes   Education Details instruction on arm movement and placement during exercise (can do only one arm at a time).   Person(s) Educated Patient   Methods Explanation;Demonstration;Verbal cues   Comprehension Verbal cues required;Returned demonstration;Verbalized understanding          OT Short Term Goals - 04/14/15 1303      OT SHORT TERM GOAL #1   Title Decreased edema to w/in 2cm DPC, 0.5 PIP contralateral hand in 0-2 weeks   Status Achieved     OT SHORT TERM GOAL #2   Title PIP joint extension to 0, no flexion contracture   Status Achieved     OT SHORT TERM GOAL #3   Title Passive isolated digital flexionMPs to 50*, PIPs to 60*, DIPs to 20*, active hold exten at 0* all joints in 5 weeks   Status Achieved     OT SHORT TERM GOAL #4   Title No rupture or gapping at repair site, compliance w/ full time orthosis, exchange night and day orthoses, reproduce HEP w/o cues   Status On-going     OT SHORT TERM GOAL #5   Title Iincision scar remodled, radial sensory nerve, dorsal ulnar sensory nerve asymptomatic w/ stretch, tolerate light and deep touch w/ pain <2/10   Status Achieved           OT Long Term Goals - 01/04/17 1012      OT LONG TERM GOAL #1   Title Patient will improve gait speed and endurance and be able to walk 650 feet in 6 minutes to negotiate around the home and community safely in 4 weeks   Baseline 540 feet at eval    Time 4   Period Weeks   Status New     OT LONG TERM GOAL #2   Title Patient will complete HEP for maximal daily exercises with modified independence in 4 weeks   Baseline no current  program at eval   Time 4   Period Weeks   Status New     OT LONG TERM GOAL #3   Title Patient will transfer from sit to stand without the use of arms safely and independently from a variety of chairs/surfaces in 4 weeks.   Baseline 5 times sit to stand 38 secs  Time 4   Period Weeks   Status New     OT LONG TERM GOAL #4   Title Patient will complete navigating in narrow spaces without evidence of freezing or hesitations.   Baseline hesitations at eval   Time 4   Period Weeks   Status New     OT LONG TERM GOAL #5   Title Patient will complete shower transfers with modified independence with decreased reported fear of falling.   Baseline Patient with increased fear of falling   Time 4   Period Weeks   Status New               Plan - 01/23/17 1303    Clinical Impression Statement Patient continues to work towards calibration of movement patterns, continues to use grab bars to perform and can shift weight to place foot then place one arm in position.  Will continue to work towards improved weight shifting and progressing to use of bilateral UE use in exercises.  Functinonal mobility improving with distance and step length, responds well to cues.    Rehab Potential Good   Clinical Impairments Affecting Rehab Potential positive:  motivation, family support, negative:  co morbidities, progressive disease processs   OT Frequency 4x / week   OT Duration 4 weeks   OT Treatment/Interventions Self-care/ADL training;DME and/or AE instruction;Patient/family education;Gait Training;Therapeutic exercises;Balance training;Therapeutic exercise;Stair Training;Therapeutic activities;Neuromuscular education;Functional Mobility Training   Consulted and Agree with Plan of Care Patient      Patient will benefit from skilled therapeutic intervention in order to improve the following deficits and impairments:  Abnormal gait, Decreased coordination, Decreased range of motion, Difficulty walking,  Impaired flexibility, Decreased endurance, Improper body mechanics, Decreased activity tolerance, Decreased balance, Decreased knowledge of use of DME, Impaired UE functional use, Pain, Decreased mobility, Decreased strength  Visit Diagnosis: Muscle weakness (generalized)  Other lack of coordination    Problem List Patient Active Problem List   Diagnosis Date Noted  . History of bilateral hip replacements 05/28/2015  . Spinal stenosis, lumbar region, with neurogenic claudication 05/28/2015  . DDD (degenerative disc disease), lumbar 03/13/2015  . Lumbar post-laminectomy syndrome 03/13/2015  . Degenerative cervical disc 03/13/2015  . Status post bilateral total hip replacement 03/13/2015  . Sacroiliac joint dysfunction 03/13/2015  . History of surgery on upper extremity 03/13/2015  . DJD of shoulder 03/13/2015  . Rheumatoid arthritis ([place]) 02/17/2015   Yvonne Singleton T Yvonne Singleton, OTR/L, CLT  Yvonne Singleton 01/23/2017, 1:06 PM  [place] MAIN [facility] SERVICES [address] [place], [place], [address] Phone: [phone]   Fax:  [phone]  Name: Yvonne Singleton MRN: [id number] Date of Birth: 02-May-1935

## 2017-01-24 ENCOUNTER — Ambulatory Visit: Payer: Medicare Other | Admitting: Occupational Therapy

## 2017-01-24 DIAGNOSIS — R262 Difficulty in walking, not elsewhere classified: Secondary | ICD-10-CM | POA: Diagnosis not present

## 2017-01-24 DIAGNOSIS — R2681 Unsteadiness on feet: Secondary | ICD-10-CM

## 2017-01-24 DIAGNOSIS — M6281 Muscle weakness (generalized): Secondary | ICD-10-CM

## 2017-01-24 DIAGNOSIS — R278 Other lack of coordination: Secondary | ICD-10-CM

## 2017-01-25 ENCOUNTER — Ambulatory Visit: Payer: Medicare Other | Admitting: Occupational Therapy

## 2017-01-25 ENCOUNTER — Encounter: Payer: Self-pay | Admitting: Occupational Therapy

## 2017-01-25 DIAGNOSIS — R278 Other lack of coordination: Secondary | ICD-10-CM

## 2017-01-25 DIAGNOSIS — R262 Difficulty in walking, not elsewhere classified: Secondary | ICD-10-CM

## 2017-01-25 DIAGNOSIS — R2681 Unsteadiness on feet: Secondary | ICD-10-CM

## 2017-01-25 DIAGNOSIS — M6281 Muscle weakness (generalized): Secondary | ICD-10-CM

## 2017-01-25 NOTE — Therapy (Signed)
[place] MAIN [facility] SERVICES [address] [place], [place], [address] Phone: [phone]   Fax:  [phone]  Occupational Therapy Treatment/Progress Note  Patient Details  Name: Yvonne Singleton MRN: [id number] Date of Birth: 1935/01/08 Referring Provider: Manuella Ghazi  Encounter Date: 01/24/2017      OT End of Session - 01/25/17 2014    Visit Number 10   Number of Visits 17   Date for OT Re-Evaluation 02/14/17   Authorization Type Medicare G code 10   OT Start Time 1100   OT Stop Time 1200   OT Time Calculation (min) 60 min      Past Medical History:  Diagnosis Date  . Back pain   . Breast cancer ([place]) 2007   right breast lumpectomy with rad tx  . Cancer [facility]) 2007   right breast  . Gout   . Hard of hearing   . Hypertension   . Parkinson's disease ([place])   . Sleep apnea     Past Surgical History:  Procedure Laterality Date  . ABDOMINAL HYSTERECTOMY    . CATARACT EXTRACTION Bilateral   . EYE SURGERY Bilateral   . HAND SURGERY    . HIP CLOSED REDUCTION Left 06/24/2015   Procedure: CLOSED REDUCTION HIP;  Surgeon: Dereck Leep, MD;  Location: [facility] ORS;  Service: Orthopedics;  Laterality: Left;  . HIP CLOSED REDUCTION Left 02/09/2016   Procedure: CLOSED MANIPULATION HIP;  Surgeon: Earnestine Leys, MD;  Location: [facility] ORS;  Service: Orthopedics;  Laterality: Left;  . HIP SURGERY    . JOINT REPLACEMENT     bilateral hip  . TOTAL KNEE ARTHROPLASTY Bilateral     There were no vitals filed for this visit.      Subjective Assessment - 01/25/17 2012    Subjective  Patient reports she had a good weekend, did as much as she could with her exercises.    Pertinent History Patient reports she was diagnosed with Parkinson's disease about 3-4 years ago, her initial symptoms was right arm and tremors, some slight tremors noted in left arm as well.     Patient Stated Goals Patient reports she would like to be able to do things easier.      Currently in Pain? Yes   Pain Score 3    Pain Location Back   Pain Orientation Lower   Pain Descriptors / Indicators Aching   Pain Type Chronic pain   Pain Onset More than a month ago   Pain Frequency Constant   Multiple Pain Sites No                      OT Treatments/Exercises (OP) - 01/25/17 0001      ADLs   ADL Comments Patient focused on functional reaching to the back of the head for 5 repetitions to assist with hair care, sit to stand for 10 times with cues for form and technique from regular chair height, turning behaviors with functional mobility with verbal cues and SBA.       Neurological Re-education Exercises   Other Exercises 1 Patient seen for instruction of LSVT BIG exercises: LSVT Daily Session Maximal Daily Exercises: Sustained movements are designed to rescale the amplitude of movement output for generalization to daily functional activities. Performed as follows for 1 set of 10 repetitions each: Multi directional sustained movements- 1) Floor to ceiling, 2) Side to side. Multi directional Repetitive movements performed in standing and are designed to provide  retraining effort needed for sustained muscle activation in tasks Performed as follows: 3) Step and reach forward, 4) Step and Reach Backwards, 5) Step and reach sideways, 6) Rock and reach forward/backward, 7) Rock and reach sideways. Sit to stand from mat table on lowest setting with cues for weight shift, technique and CGA for 10 reps for 1 set. All exercises were adapted and performed in parallel bars for safety, patient using 2 chairs at home to complete. Patient continues to progress with use of arms in select exercises based on balance and ability to weight shift, cues and CGA provided within parallel bars.  Encouraged patient to attempt to perform stepping with one foot while moving one arm simultaneously during exercises with min assist for balance and use of one hand on parallel bar.    Other  Exercises 2 Functional mobility skills for 2 trials of 250 feet with crutches, cues for length size.                 OT Education - 01/25/17 2014    Education provided Yes   Education Details daily exercises, amplitude of gait   Person(s) Educated Patient   Methods Explanation;Demonstration;Verbal cues   Comprehension Verbal cues required;Returned demonstration;Verbalized understanding          OT Short Term Goals - 04/14/15 1303      OT SHORT TERM GOAL #1   Title Decreased edema to w/in 2cm DPC, 0.5 PIP contralateral hand in 0-2 weeks   Status Achieved     OT SHORT TERM GOAL #2   Title PIP joint extension to 0, no flexion contracture   Status Achieved     OT SHORT TERM GOAL #3   Title Passive isolated digital flexionMPs to 50*, PIPs to 60*, DIPs to 20*, active hold exten at 0* all joints in 5 weeks   Status Achieved     OT SHORT TERM GOAL #4   Title No rupture or gapping at repair site, compliance w/ full time orthosis, exchange night and day orthoses, reproduce HEP w/o cues   Status On-going     OT SHORT TERM GOAL #5   Title Iincision scar remodled, radial sensory nerve, dorsal ulnar sensory nerve asymptomatic w/ stretch, tolerate light and deep touch w/ pain <2/10   Status Achieved           OT Long Term Goals - 01/04/17 1012      OT LONG TERM GOAL #1   Title Patient will improve gait speed and endurance and be able to walk 650 feet in 6 minutes to negotiate around the home and community safely in 4 weeks   Baseline 540 feet at eval    Time 4   Period Weeks   Status New     OT LONG TERM GOAL #2   Title Patient will complete HEP for maximal daily exercises with modified independence in 4 weeks   Baseline no current program at eval   Time 4   Period Weeks   Status New     OT LONG TERM GOAL #3   Title Patient will transfer from sit to stand without the use of arms safely and independently from a variety of chairs/surfaces in 4 weeks.   Baseline 5  times sit to stand 38 secs   Time 4   Period Weeks   Status New     OT LONG TERM GOAL #4   Title Patient will complete navigating in narrow spaces without evidence of freezing or  hesitations.   Baseline hesitations at eval   Time 4   Period Weeks   Status New     OT LONG TERM GOAL #5   Title Patient will complete shower transfers with modified independence with decreased reported fear of falling.   Baseline Patient with increased fear of falling   Time 4   Period Weeks   Status New               Plan - 01/25/17 2014    Clinical Impression Statement Patient working towards attempting to incorporate one arm and one leg simultaneously this week with exercises rather than holding with both arms on parallel bars and performing sequence with arm and leg separately.  Patient Continues to benefit from skilled OT for intensive LSVT BIG program with work towards improving balance, performance of daily exercises and functional mobility skills.    Rehab Potential Good   Clinical Impairments Affecting Rehab Potential positive:  motivation, family support, negative:  co morbidities, progressive disease processs   OT Frequency 4x / week   OT Duration 4 weeks   OT Treatment/Interventions Self-care/ADL training;DME and/or AE instruction;Patient/family education;Gait Training;Therapeutic exercises;Balance training;Therapeutic exercise;Stair Training;Therapeutic activities;Neuromuscular education;Functional Mobility Training   Consulted and Agree with Plan of Care Patient      Patient will benefit from skilled therapeutic intervention in order to improve the following deficits and impairments:  Abnormal gait, Decreased coordination, Decreased range of motion, Difficulty walking, Impaired flexibility, Decreased endurance, Improper body mechanics, Decreased activity tolerance, Decreased balance, Decreased knowledge of use of DME, Impaired UE functional use, Pain, Decreased mobility, Decreased  strength  Visit Diagnosis: Muscle weakness (generalized)  Other lack of coordination  Difficulty in walking, not elsewhere classified  Unsteadiness on feet      G-Codes - 2017/02/23 2015    Functional Assessment Tool Used (Outpatient only) clinical judgment, sit to stand, 6 minute walk test, 5 times sit to stand   Functional Limitation Mobility: Walking and moving around   Mobility: Walking and Moving Around Current Status [phone]) At least 60 percent but less than 80 percent impaired, limited or restricted   Mobility: Walking and Moving Around Goal Status [phone]) At least 40 percent but less than 60 percent impaired, limited or restricted      Problem List Patient Active Problem List   Diagnosis Date Noted  . History of bilateral hip replacements 05/28/2015  . Spinal stenosis, lumbar region, with neurogenic claudication 05/28/2015  . DDD (degenerative disc disease), lumbar 03/13/2015  . Lumbar post-laminectomy syndrome 03/13/2015  . Degenerative cervical disc 03/13/2015  . Status post bilateral total hip replacement 03/13/2015  . Sacroiliac joint dysfunction 03/13/2015  . History of surgery on upper extremity 03/13/2015  . DJD of shoulder 03/13/2015  . Rheumatoid arthritis ([place]) 02/17/2015   Cahterine Heinzel T Emir Nack, OTR/L, CLT  Joscelynn Brutus 01/25/2017, 8:55 PM  [place] MAIN [facility] SERVICES [address] [place], [place], [address] Phone: [phone]   Fax:  [phone]  Name: Correna Meacham MRN: [id number] Date of Birth: 26-Dec-1934

## 2017-01-26 ENCOUNTER — Ambulatory Visit: Payer: Medicare Other | Admitting: Occupational Therapy

## 2017-01-26 DIAGNOSIS — R2681 Unsteadiness on feet: Secondary | ICD-10-CM

## 2017-01-26 DIAGNOSIS — R262 Difficulty in walking, not elsewhere classified: Secondary | ICD-10-CM | POA: Diagnosis not present

## 2017-01-26 DIAGNOSIS — M6281 Muscle weakness (generalized): Secondary | ICD-10-CM

## 2017-01-26 DIAGNOSIS — R278 Other lack of coordination: Secondary | ICD-10-CM

## 2017-01-27 ENCOUNTER — Ambulatory Visit: Payer: Medicare Other | Admitting: Occupational Therapy

## 2017-01-27 DIAGNOSIS — R278 Other lack of coordination: Secondary | ICD-10-CM

## 2017-01-27 DIAGNOSIS — M6281 Muscle weakness (generalized): Secondary | ICD-10-CM

## 2017-01-27 DIAGNOSIS — R262 Difficulty in walking, not elsewhere classified: Secondary | ICD-10-CM

## 2017-01-27 DIAGNOSIS — R2681 Unsteadiness on feet: Secondary | ICD-10-CM

## 2017-01-28 ENCOUNTER — Ambulatory Visit: Payer: Medicare Other | Admitting: Occupational Therapy

## 2017-01-29 ENCOUNTER — Encounter: Payer: Self-pay | Admitting: Occupational Therapy

## 2017-01-29 NOTE — Therapy (Signed)
[place] MAIN [facility] SERVICES [address] [place], [place], [address] Phone: [phone]   Fax:  [phone]  Occupational Therapy Treatment  Patient Details  Name: Yvonne Singleton MRN: [id number] Date of Birth: [birth year] Referring Provider: Manuella Ghazi  Encounter Date: 01/27/2017      OT End of Session - 01/29/17 1325    Visit Number 13   Number of Visits 17   Date for OT Re-Evaluation 02/14/17   Authorization Type Medicare G code 13   OT Start Time 1100   OT Stop Time 1158   OT Time Calculation (min) 58 min   Activity Tolerance Patient tolerated treatment well   Behavior During Therapy [facility] for tasks assessed/performed      Past Medical History:  Diagnosis Date  . Back pain   . Breast cancer ([place]) 2007   right breast lumpectomy with rad tx  . Cancer [facility]) 2007   right breast  . Gout   . Hard of hearing   . Hypertension   . Parkinson's disease ([place])   . Sleep apnea     Past Surgical History:  Procedure Laterality Date  . ABDOMINAL HYSTERECTOMY    . CATARACT EXTRACTION Bilateral   . EYE SURGERY Bilateral   . HAND SURGERY    . HIP CLOSED REDUCTION Left 06/24/2015   Procedure: CLOSED REDUCTION HIP;  Surgeon: Dereck Leep, MD;  Location: [facility] ORS;  Service: Orthopedics;  Laterality: Left;  . HIP CLOSED REDUCTION Left 02/09/2016   Procedure: CLOSED MANIPULATION HIP;  Surgeon: Earnestine Leys, MD;  Location: [facility] ORS;  Service: Orthopedics;  Laterality: Left;  . HIP SURGERY    . JOINT REPLACEMENT     bilateral hip  . TOTAL KNEE ARTHROPLASTY Bilateral     There were no vitals filed for this visit.      Subjective Assessment - 01/29/17 1322    Subjective  Patient is pleased with her progress, she would like to make up the visits she missed next week and feels she has benefitted from the program.    Pertinent History Patient reports she was diagnosed with Parkinson's disease about 3-4 years ago, her initial symptoms was  right arm and tremors, some slight tremors noted in left arm as well.     Patient Stated Goals Patient reports she would like to be able to do things easier.     Currently in Pain? Yes   Pain Score 2    Pain Location Back   Pain Orientation Lower   Pain Descriptors / Indicators Aching   Pain Type Chronic pain   Pain Onset More than a month ago   Pain Frequency Constant                      OT Treatments/Exercises (OP) - 01/29/17 1323      ADLs   ADL Comments Patient seen for functional component tasks of: reaching to back of the head to perform hair care with cues for BIG movements, buttoning and unbuttoning buttons with prior hand flicks, stepping up and down from curb (moderate assist), sit to stand with SBA and cues, and directional turning with cues and CGA to SBA. All performed for 5 reps each.     Neurological Re-education Exercises   Other Exercises 1 Patient seen for instruction of LSVT BIG exercises: LSVT Daily Session Maximal Daily Exercises: Sustained movements are designed to rescale the amplitude of movement output for generalization to daily functional activities. Performed  as follows for 1 set of 10 repetitions each: Multi directional sustained movements- 1) Floor to ceiling, 2) Side to side. Multi directional Repetitive movements performed in standing and are designed to provide retraining effort needed for sustained muscle activation in tasks Performed as follows: 3) Step and reach forward, 4) Step and Reach Backwards, 5) Step and reach sideways, 6) Rock and reach forward/backward, 7) Rock and reach sideways. Sit to stand from mat table on lowest setting with cues for weight shift, technique and CGA for 10 reps for 1 set. All exercises were adapted and performed in parallel bars for safety, patient using 2 chairs at home to complete. Patient requires cues for all exercises for form and technique and attempts at moving arm and leg in simultaneous motions.   Other  Exercises 2 2 trials of functional mobility on flat surface for 275 feet each with cues for step length and posture during mobility.                  OT Education - 01/29/17 1324    Education provided Yes   Education Details HEP   Person(s) Educated Patient   Methods Explanation;Demonstration;Verbal cues   Comprehension Verbal cues required;Returned demonstration;Verbalized understanding          OT Short Term Goals - 04/14/15 1303      OT SHORT TERM GOAL #1   Title Decreased edema to w/in 2cm DPC, 0.5 PIP contralateral hand in 0-2 weeks   Status Achieved     OT SHORT TERM GOAL #2   Title PIP joint extension to 0, no flexion contracture   Status Achieved     OT SHORT TERM GOAL #3   Title Passive isolated digital flexionMPs to 50*, PIPs to 60*, DIPs to 20*, active hold exten at 0* all joints in 5 weeks   Status Achieved     OT SHORT TERM GOAL #4   Title No rupture or gapping at repair site, compliance w/ full time orthosis, exchange night and day orthoses, reproduce HEP w/o cues   Status On-going     OT SHORT TERM GOAL #5   Title Iincision scar remodled, radial sensory nerve, dorsal ulnar sensory nerve asymptomatic w/ stretch, tolerate light and deep touch w/ pain <2/10   Status Achieved           OT Long Term Goals - 01/04/17 1012      OT LONG TERM GOAL #1   Title Patient will improve gait speed and endurance and be able to walk 650 feet in 6 minutes to negotiate around the home and community safely in 4 weeks   Baseline 540 feet at eval    Time 4   Period Weeks   Status New     OT LONG TERM GOAL #2   Title Patient will complete HEP for maximal daily exercises with modified independence in 4 weeks   Baseline no current program at eval   Time 4   Period Weeks   Status New     OT LONG TERM GOAL #3   Title Patient will transfer from sit to stand without the use of arms safely and independently from a variety of chairs/surfaces in 4 weeks.   Baseline 5  times sit to stand 38 secs   Time 4   Period Weeks   Status New     OT LONG TERM GOAL #4   Title Patient will complete navigating in narrow spaces without evidence of freezing or hesitations.  Baseline hesitations at eval   Time 4   Period Weeks   Status New     OT LONG TERM GOAL #5   Title Patient will complete shower transfers with modified independence with decreased reported fear of falling.   Baseline Patient with increased fear of falling   Time 4   Period Weeks   Status New               Plan - 01/29/17 1325    Clinical Impression Statement Patient continues to report decreased back pain over time and has been working on performing exercises at home.  Patient missed a few of her appointments in the last weeks due to conflicts and feels the need to make up those sessions especially since she has less back pain and reports she is walking better.    Rehab Potential Good   Clinical Impairments Affecting Rehab Potential positive:  motivation, family support, negative:  co morbidities, progressive disease processs   OT Frequency 4x / week   OT Duration 4 weeks   OT Treatment/Interventions Self-care/ADL training;DME and/or AE instruction;Patient/family education;Gait Training;Therapeutic exercises;Balance training;Therapeutic exercise;Stair Training;Therapeutic activities;Neuromuscular education;Functional Mobility Training   Consulted and Agree with Plan of Care Patient      Patient will benefit from skilled therapeutic intervention in order to improve the following deficits and impairments:  Abnormal gait, Decreased coordination, Decreased range of motion, Difficulty walking, Impaired flexibility, Decreased endurance, Improper body mechanics, Decreased activity tolerance, Decreased balance, Decreased knowledge of use of DME, Impaired UE functional use, Pain, Decreased mobility, Decreased strength  Visit Diagnosis: Muscle weakness (generalized)  Other lack of  coordination  Difficulty in walking, not elsewhere classified  Unsteadiness on feet    Problem List Patient Active Problem List   Diagnosis Date Noted  . History of bilateral hip replacements 05/28/2015  . Spinal stenosis, lumbar region, with neurogenic claudication 05/28/2015  . DDD (degenerative disc disease), lumbar 03/13/2015  . Lumbar post-laminectomy syndrome 03/13/2015  . Degenerative cervical disc 03/13/2015  . Status post bilateral total hip replacement 03/13/2015  . Sacroiliac joint dysfunction 03/13/2015  . History of surgery on upper extremity 03/13/2015  . DJD of shoulder 03/13/2015  . Rheumatoid arthritis ([place]) 02/17/2015   Amy T Lovett, OTR/L, CLT  Lovett,Amy 01/29/2017, 1:27 PM  [place] MAIN [facility] SERVICES [address] [place], [place], [address] Phone: [phone]   Fax:  [phone]  Name: Yvonne Singleton MRN: [id number] Date of Birth: 12-Jun-1935

## 2017-01-29 NOTE — Therapy (Signed)
[place] MAIN [facility] SERVICES [address] [place], [place], [address] Phone: [phone]   Fax:  [phone]  Occupational Therapy Treatment  Patient Details  Name: Yvonne Singleton MRN: [id number] Date of Birth: 03-05-1935 Referring Provider: Manuella Ghazi  Encounter Date: 01/25/2017      OT End of Session - 01/29/17 1310    Visit Number 11   Number of Visits 17   Date for OT Re-Evaluation 02/14/17   Authorization Type Medicare G code 11   OT Start Time 1101   OT Stop Time 1200   OT Time Calculation (min) 59 min   Activity Tolerance Patient tolerated treatment well   Behavior During Therapy [facility] for tasks assessed/performed      Past Medical History:  Diagnosis Date  . Back pain   . Breast cancer ([place]) 2007   right breast lumpectomy with rad tx  . Cancer [facility]) 2007   right breast  . Gout   . Hard of hearing   . Hypertension   . Parkinson's disease ([place])   . Sleep apnea     Past Surgical History:  Procedure Laterality Date  . ABDOMINAL HYSTERECTOMY    . CATARACT EXTRACTION Bilateral   . EYE SURGERY Bilateral   . HAND SURGERY    . HIP CLOSED REDUCTION Left 06/24/2015   Procedure: CLOSED REDUCTION HIP;  Surgeon: Dereck Leep, MD;  Location: [facility] ORS;  Service: Orthopedics;  Laterality: Left;  . HIP CLOSED REDUCTION Left 02/09/2016   Procedure: CLOSED MANIPULATION HIP;  Surgeon: Earnestine Leys, MD;  Location: [facility] ORS;  Service: Orthopedics;  Laterality: Left;  . HIP SURGERY    . JOINT REPLACEMENT     bilateral hip  . TOTAL KNEE ARTHROPLASTY Bilateral     There were no vitals filed for this visit.      Subjective Assessment - 01/29/17 1305    Subjective  Patient reports she feels she has made good progress in all areas so far.    Pertinent History Patient reports she was diagnosed with Parkinson's disease about 3-4 years ago, her initial symptoms was right arm and tremors, some slight tremors noted in left arm as well.      Patient Stated Goals Patient reports she would like to be able to do things easier.     Currently in Pain? Yes   Pain Score 3    Pain Location Back   Pain Orientation Lower   Pain Descriptors / Indicators Aching   Pain Onset More than a month ago   Pain Frequency Constant                      OT Treatments/Exercises (OP) - 01/29/17 1307      ADLs   ADL Comments Patient seen for functional component tasks of: reaching to back of the head to perform hair care with cues for BIG movements, buttoning and unbuttoning buttons with prior hand flicks, stepping up and down from curb (moderate assist), sit to stand with SBA and cues, and directional turning with cues and CGA to SBA. All performed for 5 reps each.     Neurological Re-education Exercises   Other Exercises 1 Patient seen for instruction of LSVT BIG exercises: LSVT Daily Session Maximal Daily Exercises: Sustained movements are designed to rescale the amplitude of movement output for generalization to daily functional activities. Performed as follows for 1 set of 10 repetitions each: Multi directional sustained movements- 1) Floor to ceiling, 2)  Side to side. Multi directional Repetitive movements performed in standing and are designed to provide retraining effort needed for sustained muscle activation in tasks Performed as follows: 3) Step and reach forward, 4) Step and Reach Backwards, 5) Step and reach sideways, 6) Rock and reach forward/backward, 7) Rock and reach sideways. Sit to stand from mat table on lowest setting with cues for weight shift, technique and CGA for 10 reps for 1 set. All exercises were adapted and performed in parallel bars for safety, patient using 2 chairs at home to complete. Patient requires cues for all exercises for form and technique and attempts at moving arm and leg in simultaneous motions.    Other Exercises 2 Functional mobility skills for 2 trials of 250 feet with crutches, cues for length size.                 OT Education - 01/29/17 1310    Education provided Yes   Education Details maximal daily exercises, weight shifting   Person(s) Educated Patient   Methods Explanation;Demonstration;Verbal cues   Comprehension Verbal cues required;Returned demonstration;Verbalized understanding          OT Short Term Goals - 04/14/15 1303      OT SHORT TERM GOAL #1   Title Decreased edema to w/in 2cm DPC, 0.5 PIP contralateral hand in 0-2 weeks   Status Achieved     OT SHORT TERM GOAL #2   Title PIP joint extension to 0, no flexion contracture   Status Achieved     OT SHORT TERM GOAL #3   Title Passive isolated digital flexionMPs to 50*, PIPs to 60*, DIPs to 20*, active hold exten at 0* all joints in 5 weeks   Status Achieved     OT SHORT TERM GOAL #4   Title No rupture or gapping at repair site, compliance w/ full time orthosis, exchange night and day orthoses, reproduce HEP w/o cues   Status On-going     OT SHORT TERM GOAL #5   Title Iincision scar remodled, radial sensory nerve, dorsal ulnar sensory nerve asymptomatic w/ stretch, tolerate light and deep touch w/ pain <2/10   Status Achieved           OT Long Term Goals - 01/04/17 1012      OT LONG TERM GOAL #1   Title Patient will improve gait speed and endurance and be able to walk 650 feet in 6 minutes to negotiate around the home and community safely in 4 weeks   Baseline 540 feet at eval    Time 4   Period Weeks   Status New     OT LONG TERM GOAL #2   Title Patient will complete HEP for maximal daily exercises with modified independence in 4 weeks   Baseline no current program at eval   Time 4   Period Weeks   Status New     OT LONG TERM GOAL #3   Title Patient will transfer from sit to stand without the use of arms safely and independently from a variety of chairs/surfaces in 4 weeks.   Baseline 5 times sit to stand 38 secs   Time 4   Period Weeks   Status New     OT LONG TERM GOAL #4    Title Patient will complete navigating in narrow spaces without evidence of freezing or hesitations.   Baseline hesitations at eval   Time 4   Period Weeks   Status New  OT LONG TERM GOAL #5   Title Patient will complete shower transfers with modified independence with decreased reported fear of falling.   Baseline Patient with increased fear of falling   Time 4   Period Weeks   Status New               Plan - 01/29/17 1311    Clinical Impression Statement Patient continues to work towards improving performance of maximal daily exercises with emphasis on weight shift and balance to complete stepping exercises especially on the right side. Patient continues to work with crutches for functional mobility but has been working on improved posture as well as step length with mobility skills.    Rehab Potential Good   Clinical Impairments Affecting Rehab Potential positive:  motivation, family support, negative:  co morbidities, progressive disease processs   OT Frequency 4x / week   OT Duration 4 weeks   OT Treatment/Interventions Self-care/ADL training;DME and/or AE instruction;Patient/family education;Gait Training;Therapeutic exercises;Balance training;Therapeutic exercise;Stair Training;Therapeutic activities;Neuromuscular education;Functional Mobility Training   Consulted and Agree with Plan of Care Patient      Patient will benefit from skilled therapeutic intervention in order to improve the following deficits and impairments:  Abnormal gait, Decreased coordination, Decreased range of motion, Difficulty walking, Impaired flexibility, Decreased endurance, Improper body mechanics, Decreased activity tolerance, Decreased balance, Decreased knowledge of use of DME, Impaired UE functional use, Pain, Decreased mobility, Decreased strength  Visit Diagnosis: Difficulty in walking, not elsewhere classified  Muscle weakness (generalized)  Other lack of coordination  Unsteadiness  on feet    Problem List Patient Active Problem List   Diagnosis Date Noted  . History of bilateral hip replacements 05/28/2015  . Spinal stenosis, lumbar region, with neurogenic claudication 05/28/2015  . DDD (degenerative disc disease), lumbar 03/13/2015  . Lumbar post-laminectomy syndrome 03/13/2015  . Degenerative cervical disc 03/13/2015  . Status post bilateral total hip replacement 03/13/2015  . Sacroiliac joint dysfunction 03/13/2015  . History of surgery on upper extremity 03/13/2015  . DJD of shoulder 03/13/2015  . Rheumatoid arthritis ([place]) 02/17/2015   Porter Moes T Jebadiah Imperato, OTR/L, CLT  Jalayna Josten 01/29/2017, 1:14 PM  [place] MAIN [facility] SERVICES [address] [place], [place], [address] Phone: [phone]   Fax:  [phone]  Name: Jaylise Peek MRN: [id number] Date of Birth: July 21, 1935

## 2017-01-29 NOTE — Therapy (Signed)
[employer] MAIN [facility] SERVICES [address] [place], [place], [address] Phone: [phone]   Fax:  [phone]  Occupational Therapy Treatment  Patient Details  Name: Yvonne Singleton MRN: [id number] Date of Birth: Apr 13, 1935 Referring Provider: Manuella Ghazi  Encounter Date: 01/26/2017      OT End of Session - 01/29/17 1318    Visit Number 12   Number of Visits 17   Date for OT Re-Evaluation 02/14/17   Authorization Type Medicare G code 12   OT Start Time 1100   OT Stop Time 1200   OT Time Calculation (min) 60 min   Activity Tolerance Patient tolerated treatment well   Behavior During Therapy [facility] for tasks assessed/performed      Past Medical History:  Diagnosis Date  . Back pain   . Breast cancer ([place]) 2007   right breast lumpectomy with rad tx  . Cancer [facility]) 2007   right breast  . Gout   . Hard of hearing   . Hypertension   . Parkinson's disease ([place])   . Sleep apnea     Past Surgical History:  Procedure Laterality Date  . ABDOMINAL HYSTERECTOMY    . CATARACT EXTRACTION Bilateral   . EYE SURGERY Bilateral   . HAND SURGERY    . HIP CLOSED REDUCTION Left 06/24/2015   Procedure: CLOSED REDUCTION HIP;  Surgeon: Dereck Leep, MD;  Location: [facility] ORS;  Service: Orthopedics;  Laterality: Left;  . HIP CLOSED REDUCTION Left 02/09/2016   Procedure: CLOSED MANIPULATION HIP;  Surgeon: Earnestine Leys, MD;  Location: [facility] ORS;  Service: Orthopedics;  Laterality: Left;  . HIP SURGERY    . JOINT REPLACEMENT     bilateral hip  . TOTAL KNEE ARTHROPLASTY Bilateral     There were no vitals filed for this visit.      Subjective Assessment - 01/29/17 1315    Subjective  Patient reports her back pain has gotten much less now since starting and feels better, reports it as overall decreased from 6/10 to 2/10 today.    Pertinent History Patient reports she was diagnosed with Parkinson's disease about 3-4 years ago, her initial symptoms was  right arm and tremors, some slight tremors noted in left arm as well.     Patient Stated Goals Patient reports she would like to be able to do things easier.     Currently in Pain? Yes   Pain Score 2    Pain Location Back   Pain Orientation Lower   Pain Descriptors / Indicators Aching   Pain Type Chronic pain   Pain Onset More than a month ago   Pain Frequency Constant                      OT Treatments/Exercises (OP) - 01/29/17 1316      ADLs   ADL Comments Patient seen for functional component tasks of: reaching to back of the head to perform hair care with cues for BIG movements, buttoning and unbuttoning buttons with prior hand flicks, stepping up and down from curb (moderate assist), sit to stand with SBA and cues, and directional turning with cues and CGA to SBA. All performed for 5 reps each.     Neurological Re-education Exercises   Other Exercises 1 Patient seen for instruction of LSVT BIG exercises: LSVT Daily Session Maximal Daily Exercises: Sustained movements are designed to rescale the amplitude of movement output for generalization to daily functional activities. Performed as  follows for 1 set of 10 repetitions each: Multi directional sustained movements- 1) Floor to ceiling, 2) Side to side. Multi directional Repetitive movements performed in standing and are designed to provide retraining effort needed for sustained muscle activation in tasks Performed as follows: 3) Step and reach forward, 4) Step and Reach Backwards, 5) Step and reach sideways, 6) Rock and reach forward/backward, 7) Rock and reach sideways. Sit to stand from mat table on lowest setting with cues for weight shift, technique and CGA for 10 reps for 1 set. All exercises were adapted and performed in parallel bars for safety, patient using 2 chairs at home to complete. Patient requires cues for all exercises for form and technique and attempts at moving arm and leg in simultaneous motions.    Other  Exercises 2 Functional mobilty skills with use of crutches, cues for step length, 1 trial of 300 feet, another trial of 100 feet.                 OT Education - 01/29/17 1317    Education provided Yes   Education Details HEP, posture   Person(s) Educated Patient   Methods Explanation;Demonstration;Verbal cues   Comprehension Verbal cues required;Returned demonstration;Verbalized understanding          OT Short Term Goals - 04/14/15 1303      OT SHORT TERM GOAL #1   Title Decreased edema to w/in 2cm DPC, 0.5 PIP contralateral hand in 0-2 weeks   Status Achieved     OT SHORT TERM GOAL #2   Title PIP joint extension to 0, no flexion contracture   Status Achieved     OT SHORT TERM GOAL #3   Title Passive isolated digital flexionMPs to 50*, PIPs to 60*, DIPs to 20*, active hold exten at 0* all joints in 5 weeks   Status Achieved     OT SHORT TERM GOAL #4   Title No rupture or gapping at repair site, compliance w/ full time orthosis, exchange night and day orthoses, reproduce HEP w/o cues   Status On-going     OT SHORT TERM GOAL #5   Title Iincision scar remodled, radial sensory nerve, dorsal ulnar sensory nerve asymptomatic w/ stretch, tolerate light and deep touch w/ pain <2/10   Status Achieved           OT Long Term Goals - 01/04/17 1012      OT LONG TERM GOAL #1   Title Patient will improve gait speed and endurance and be able to walk 650 feet in 6 minutes to negotiate around the home and community safely in 4 weeks   Baseline 540 feet at eval    Time 4   Period Weeks   Status New     OT LONG TERM GOAL #2   Title Patient will complete HEP for maximal daily exercises with modified independence in 4 weeks   Baseline no current program at eval   Time 4   Period Weeks   Status New     OT LONG TERM GOAL #3   Title Patient will transfer from sit to stand without the use of arms safely and independently from a variety of chairs/surfaces in 4 weeks.    Baseline 5 times sit to stand 38 secs   Time 4   Period Weeks   Status New     OT LONG TERM GOAL #4   Title Patient will complete navigating in narrow spaces without evidence of freezing or hesitations.  Baseline hesitations at eval   Time 4   Period Weeks   Status New     OT LONG TERM GOAL #5   Title Patient will complete shower transfers with modified independence with decreased reported fear of falling.   Baseline Patient with increased fear of falling   Time 4   Period Weeks   Status New               Plan - 01/29/17 1318    Clinical Impression Statement Patient has continued to progress with performance in exercises, she is able to pick up and move her feet with greater amplitude, not dragging feet when stepping in multi directions.  She is slowly incorporating her arms into tasks but still has to perform unilateral arm and leg performance and using one arm on parallel bars.  Her back pain and reduced significantly since the beginning of therapy from a 6 to now a 2.    Rehab Potential Good   Clinical Impairments Affecting Rehab Potential positive:  motivation, family support, negative:  co morbidities, progressive disease processs   OT Frequency 4x / week   OT Duration 4 weeks   OT Treatment/Interventions Self-care/ADL training;DME and/or AE instruction;Patient/family education;Gait Training;Therapeutic exercises;Balance training;Therapeutic exercise;Stair Training;Therapeutic activities;Neuromuscular education;Functional Mobility Training   Consulted and Agree with Plan of Care Patient      Patient will benefit from skilled therapeutic intervention in order to improve the following deficits and impairments:  Abnormal gait, Decreased coordination, Decreased range of motion, Difficulty walking, Impaired flexibility, Decreased endurance, Improper body mechanics, Decreased activity tolerance, Decreased balance, Decreased knowledge of use of DME, Impaired UE functional use,  Pain, Decreased mobility, Decreased strength  Visit Diagnosis: Muscle weakness (generalized)  Other lack of coordination  Difficulty in walking, not elsewhere classified  Unsteadiness on feet    Problem List Patient Active Problem List   Diagnosis Date Noted  . History of bilateral hip replacements 05/28/2015  . Spinal stenosis, lumbar region, with neurogenic claudication 05/28/2015  . DDD (degenerative disc disease), lumbar 03/13/2015  . Lumbar post-laminectomy syndrome 03/13/2015  . Degenerative cervical disc 03/13/2015  . Status post bilateral total hip replacement 03/13/2015  . Sacroiliac joint dysfunction 03/13/2015  . History of surgery on upper extremity 03/13/2015  . DJD of shoulder 03/13/2015  . Rheumatoid arthritis ([place]) 02/17/2015   Amy T Lovett, OTR/L, CLT  Lovett,Amy 01/29/2017, 1:21 PM  [place] MAIN [facility] SERVICES [address] [place], [place], [address] Phone: [phone]   Fax:  [phone]  Name: Iley Deignan MRN: [id number] Date of Birth: 1935-08-29

## 2017-02-01 ENCOUNTER — Ambulatory Visit: Payer: Medicare Other | Admitting: Occupational Therapy

## 2017-02-02 ENCOUNTER — Ambulatory Visit: Payer: Medicare Other | Admitting: Occupational Therapy

## 2017-02-03 ENCOUNTER — Ambulatory Visit: Payer: Medicare Other | Attending: Neurology | Admitting: Occupational Therapy

## 2017-03-21 ENCOUNTER — Other Ambulatory Visit: Payer: Self-pay | Admitting: Internal Medicine

## 2017-03-21 DIAGNOSIS — R1312 Dysphagia, oropharyngeal phase: Secondary | ICD-10-CM

## 2017-03-22 ENCOUNTER — Other Ambulatory Visit: Payer: Self-pay | Admitting: Internal Medicine

## 2017-03-22 DIAGNOSIS — R1310 Dysphagia, unspecified: Secondary | ICD-10-CM

## 2017-03-30 ENCOUNTER — Ambulatory Visit
Admission: RE | Admit: 2017-03-30 | Discharge: 2017-03-30 | Disposition: A | Discharge status: Home / Self Care | Payer: Medicare Other | Source: Ambulatory Visit | Attending: Internal Medicine | Admitting: Internal Medicine

## 2017-03-30 DIAGNOSIS — K224 Dyskinesia of esophagus: Secondary | ICD-10-CM | POA: Insufficient documentation

## 2017-03-30 DIAGNOSIS — R1310 Dysphagia, unspecified: Secondary | ICD-10-CM | POA: Diagnosis present

## 2017-03-30 DIAGNOSIS — K449 Diaphragmatic hernia without obstruction or gangrene: Secondary | ICD-10-CM | POA: Diagnosis not present

## 2017-03-30 DIAGNOSIS — K219 Gastro-esophageal reflux disease without esophagitis: Secondary | ICD-10-CM | POA: Insufficient documentation

## 2017-04-05 ENCOUNTER — Ambulatory Visit: Payer: Medicare Other

## 2017-04-13 ENCOUNTER — Ambulatory Visit: Payer: Medicare Other

## 2017-05-12 ENCOUNTER — Other Ambulatory Visit: Payer: Self-pay | Admitting: Gastroenterology

## 2017-05-12 DIAGNOSIS — R9389 Abnormal findings on diagnostic imaging of other specified body structures: Secondary | ICD-10-CM

## 2017-05-18 ENCOUNTER — Ambulatory Visit
Admission: RE | Admit: 2017-05-18 | Discharge: 2017-05-18 | Disposition: A | Discharge status: Home / Self Care | Payer: Medicare Other | Source: Ambulatory Visit | Attending: Gastroenterology | Admitting: Gastroenterology

## 2017-05-18 DIAGNOSIS — I2510 Atherosclerotic heart disease of native coronary artery without angina pectoris: Secondary | ICD-10-CM | POA: Diagnosis not present

## 2017-05-18 DIAGNOSIS — R911 Solitary pulmonary nodule: Secondary | ICD-10-CM | POA: Diagnosis not present

## 2017-05-18 DIAGNOSIS — R938 Abnormal findings on diagnostic imaging of other specified body structures: Secondary | ICD-10-CM | POA: Diagnosis present

## 2017-05-18 DIAGNOSIS — R1310 Dysphagia, unspecified: Secondary | ICD-10-CM | POA: Diagnosis present

## 2017-05-18 DIAGNOSIS — R933 Abnormal findings on diagnostic imaging of other parts of digestive tract: Secondary | ICD-10-CM | POA: Diagnosis present

## 2017-05-18 DIAGNOSIS — K838 Other specified diseases of biliary tract: Secondary | ICD-10-CM | POA: Diagnosis not present

## 2017-05-18 DIAGNOSIS — R9389 Abnormal findings on diagnostic imaging of other specified body structures: Secondary | ICD-10-CM

## 2017-05-18 DIAGNOSIS — I700 Atherosclerosis of aorta: Secondary | ICD-10-CM | POA: Insufficient documentation

## 2017-05-18 DIAGNOSIS — K449 Diaphragmatic hernia without obstruction or gangrene: Secondary | ICD-10-CM | POA: Diagnosis not present

## 2017-05-18 HISTORY — DX: Systemic involvement of connective tissue, unspecified: M35.9

## 2017-05-18 LAB — POCT I-STAT CREATININE: Creatinine, Ser: 1 mg/dL (ref 0.44–1.00)

## 2017-05-18 MED ORDER — DIPHENHYDRAMINE HCL 50 MG/ML IJ SOLN
INTRAMUSCULAR | Status: AC
  Filled 2017-05-18: qty 1

## 2017-05-18 MED ORDER — DIPHENHYDRAMINE HCL 50 MG/ML IJ SOLN
50.0000 mg | Freq: Once | INTRAMUSCULAR | Status: AC
  Administered 2017-05-18: 50 mg via INTRAVENOUS

## 2017-05-18 MED ORDER — IOPAMIDOL (ISOVUE-300) INJECTION 61%
75.0000 mL | Freq: Once | INTRAVENOUS | Status: AC | PRN
  Administered 2017-05-18: 75 mL via INTRAVENOUS

## 2017-05-25 ENCOUNTER — Ambulatory Visit: Payer: Medicare Other | Admitting: Anesthesiology

## 2017-05-25 ENCOUNTER — Encounter
Admission: RE | Disposition: A | Discharge status: Home / Self Care | Payer: Self-pay | Source: Ambulatory Visit | Attending: Gastroenterology

## 2017-05-25 ENCOUNTER — Ambulatory Visit
Admission: RE | Admit: 2017-05-25 | Discharge: 2017-05-25 | Disposition: A | Discharge status: Home / Self Care | Payer: Medicare Other | Source: Ambulatory Visit | Attending: Gastroenterology | Admitting: Gastroenterology

## 2017-05-25 DIAGNOSIS — I10 Essential (primary) hypertension: Secondary | ICD-10-CM | POA: Diagnosis not present

## 2017-05-25 DIAGNOSIS — G4730 Sleep apnea, unspecified: Secondary | ICD-10-CM | POA: Insufficient documentation

## 2017-05-25 DIAGNOSIS — Z79899 Other long term (current) drug therapy: Secondary | ICD-10-CM | POA: Insufficient documentation

## 2017-05-25 DIAGNOSIS — K3189 Other diseases of stomach and duodenum: Secondary | ICD-10-CM | POA: Insufficient documentation

## 2017-05-25 DIAGNOSIS — M109 Gout, unspecified: Secondary | ICD-10-CM | POA: Insufficient documentation

## 2017-05-25 DIAGNOSIS — Z888 Allergy status to other drugs, medicaments and biological substances status: Secondary | ICD-10-CM | POA: Insufficient documentation

## 2017-05-25 DIAGNOSIS — Z9889 Other specified postprocedural states: Secondary | ICD-10-CM | POA: Diagnosis not present

## 2017-05-25 DIAGNOSIS — Z881 Allergy status to other antibiotic agents status: Secondary | ICD-10-CM | POA: Insufficient documentation

## 2017-05-25 DIAGNOSIS — Q399 Congenital malformation of esophagus, unspecified: Secondary | ICD-10-CM | POA: Insufficient documentation

## 2017-05-25 DIAGNOSIS — R935 Abnormal findings on diagnostic imaging of other abdominal regions, including retroperitoneum: Secondary | ICD-10-CM | POA: Diagnosis present

## 2017-05-25 DIAGNOSIS — G20 Parkinson's disease: Secondary | ICD-10-CM | POA: Insufficient documentation

## 2017-05-25 DIAGNOSIS — K224 Dyskinesia of esophagus: Secondary | ICD-10-CM | POA: Diagnosis not present

## 2017-05-25 DIAGNOSIS — M359 Systemic involvement of connective tissue, unspecified: Secondary | ICD-10-CM | POA: Insufficient documentation

## 2017-05-25 DIAGNOSIS — K449 Diaphragmatic hernia without obstruction or gangrene: Secondary | ICD-10-CM | POA: Diagnosis not present

## 2017-05-25 DIAGNOSIS — Z91041 Radiographic dye allergy status: Secondary | ICD-10-CM | POA: Diagnosis not present

## 2017-05-25 DIAGNOSIS — K228 Other specified diseases of esophagus: Secondary | ICD-10-CM | POA: Diagnosis not present

## 2017-05-25 DIAGNOSIS — Z853 Personal history of malignant neoplasm of breast: Secondary | ICD-10-CM | POA: Diagnosis not present

## 2017-05-25 DIAGNOSIS — R1310 Dysphagia, unspecified: Secondary | ICD-10-CM | POA: Diagnosis present

## 2017-05-25 DIAGNOSIS — Z923 Personal history of irradiation: Secondary | ICD-10-CM | POA: Insufficient documentation

## 2017-05-25 HISTORY — PX: ESOPHAGOGASTRODUODENOSCOPY (EGD) WITH PROPOFOL: SHX5813

## 2017-05-25 SURGERY — ESOPHAGOGASTRODUODENOSCOPY (EGD) WITH PROPOFOL
Anesthesia: General

## 2017-05-25 MED ORDER — LIDOCAINE HCL (PF) 2 % IJ SOLN
INTRAMUSCULAR | Status: AC
  Filled 2017-05-25: qty 2

## 2017-05-25 MED ORDER — SODIUM CHLORIDE 0.9 % IV SOLN: INTRAVENOUS | Status: DC

## 2017-05-25 MED ORDER — PROPOFOL 10 MG/ML IV BOLUS
INTRAVENOUS | Status: DC | PRN
  Administered 2017-05-25: 30 mg via INTRAVENOUS

## 2017-05-25 MED ORDER — PROPOFOL 500 MG/50ML IV EMUL
INTRAVENOUS | Status: DC | PRN
  Administered 2017-05-25: 120 ug/kg/min via INTRAVENOUS

## 2017-05-25 MED ORDER — SODIUM CHLORIDE 0.9 % IV SOLN
INTRAVENOUS | Status: DC
  Administered 2017-05-25: 13:00:00 via INTRAVENOUS

## 2017-05-25 MED ORDER — PROPOFOL 10 MG/ML IV BOLUS
INTRAVENOUS | Status: AC
  Filled 2017-05-25: qty 20

## 2017-05-25 NOTE — Anesthesia Preprocedure Evaluation (Signed)
Anesthesia Evaluation  Patient identified by MRN, date of birth, ID band Patient awake    Reviewed: Allergy & Precautions, NPO status , Patient's Chart, lab work & pertinent test results  Airway Mallampati: II       Dental  (+) Upper Dentures, Lower Dentures   Pulmonary sleep apnea ,     + decreased breath sounds      Cardiovascular Exercise Tolerance: Good hypertension, Pt. on medications  Rhythm:Regular Rate:Normal     Neuro/Psych Parkinson's    GI/Hepatic negative GI ROS, Neg liver ROS,   Endo/Other  negative endocrine ROS  Renal/GU negative Renal ROS     Musculoskeletal   Abdominal (+) + obese,   Peds negative pediatric ROS (+)  Hematology negative hematology ROS (+)   Anesthesia Other Findings   Reproductive/Obstetrics                             Anesthesia Physical Anesthesia Plan  ASA: III  Anesthesia Plan: General   Post-op Pain Management:    Induction: Intravenous  PONV Risk Score and Plan:   Airway Management Planned: Natural Airway and Nasal Cannula  Additional Equipment:   Intra-op Plan:   Post-operative Plan:   Informed Consent: I have reviewed the patients History and Physical, chart, labs and discussed the procedure including the risks, benefits and alternatives for the proposed anesthesia with the patient or authorized representative who has indicated his/her understanding and acceptance.     Plan Discussed with: Surgeon  Anesthesia Plan Comments:         Anesthesia Quick Evaluation

## 2017-05-25 NOTE — H&P (Signed)
Outpatient short stay form Pre-procedure 05/25/2017 1:27 PM Lollie Sails MD  Primary Physician: Dr Lamonte Sakai  Reason for visit:  EGD  History of present illness:  Patient is a 82 year old female presenting today as above. She has a history of a finding of a large hiatal hernia. This seems to be giving her more problems over. It time with reflux issues and currently emesis of clear material. She does not regurgitate foods. She does state this foods seem to pass slowly. He states when she throws up she does not throw up foods but only clear materials. She has only recently been started on a proton pump inhibitor, she is taking omeprazole 20 mg twice a day. She is taking it at a appropriate interval. She had a barium swallow on 03/30/2017 showing a large hiatal hernia with half the stomach above the diaphragm, severe gastroesophageal reflux, intermittent distal esophageal spasm, no evidence of esophageal stricture and normal passage of a barium tablet. He is also had a CT scan of the chest showing a moderate to large hiatal hernia about evidence of inflammatory changes.  She takes no blood thinners or aspirin products.    Current Facility-Administered Medications:  .  0.9 %  sodium chloride infusion, , Intravenous, Continuous, Lollie Sails, MD, Last Rate: 20 mL/hr at 05/25/17 1257 .  0.9 %  sodium chloride infusion, , Intravenous, Continuous, Lollie Sails, MD  Facility-Administered Medications Prior to Admission  Medication Dose Route Frequency Provider Last Rate Last Dose  . fentaNYL (SUBLIMAZE) injection 100 mcg  100 mcg Intravenous Once Mohammed Kindle, MD      . lactated ringers infusion 1,000 mL  1,000 mL Intravenous Continuous Mohammed Kindle, MD 125 mL/hr at 05/03/16 1240 1,000 mL at 05/03/16 1240  . midazolam (VERSED) 5 MG/5ML injection 5 mg  5 mg Intravenous Once Mohammed Kindle, MD      . orphenadrine (NORFLEX) injection 60 mg  60 mg Intramuscular Once Mohammed Kindle, MD       . sodium chloride flush (NS) 0.9 % injection 20 mL  20 mL Other Once Mohammed Kindle, MD      . triamcinolone acetonide (KENALOG-40) injection 40 mg  40 mg Other Once Mohammed Kindle, MD       Prescriptions Prior to Admission  Medication Sig Dispense Refill Last Dose  . allopurinol (ZYLOPRIM) 100 MG tablet Take 200 mg by mouth at bedtime.    05/24/2017 at Unknown time  . b complex vitamins tablet Take 1 tablet by mouth daily.   05/24/2017 at Unknown time  . Calcium Carbonate-Vitamin D (CALCIUM 600+D) 600-200 MG-UNIT TABS Take 1 tablet by mouth daily.   05/24/2017 at Unknown time  . carbidopa-levodopa (SINEMET CR) 50-200 MG per tablet Take 1 tablet by mouth at bedtime.    05/24/2017 at Unknown time  . DULoxetine (CYMBALTA) 30 MG capsule Take 30 mg by mouth daily.   05/24/2017 at Unknown time  . furosemide (LASIX) 20 MG tablet Take 20 mg by mouth daily.    05/24/2017 at Unknown time  . gabapentin (NEURONTIN) 100 MG capsule Limit 1 capsule by mouth per day if tolerated 30 capsule 0 05/24/2017 at Unknown time  . gabapentin (NEURONTIN) 100 MG capsule Limit 1 capsule by mouth per day or every other day if tolerated 30 capsule 0 05/24/2017 at Unknown time  . hydroxychloroquine (PLAQUENIL) 200 MG tablet Take 400 mg by mouth daily.    05/24/2017 at Unknown time  . losartan (COZAAR) 100 MG tablet Take  100 mg by mouth daily.    05/25/2017 at Unknown time  . oxyCODONE (OXY IR/ROXICODONE) 5 MG immediate release tablet Limit 1/2 - 1 tablet by mouth per day or 2  times per day if tolerated 60 tablet 0 05/24/2017 at Unknown time  . tolterodine (DETROL LA) 4 MG 24 hr capsule Take 4 mg by mouth daily.   05/24/2017 at Unknown time  . alendronate (FOSAMAX) 70 MG tablet Take 70 mg by mouth once a week. Pt takes on Wednesday.   Take with a full glass of water on an empty stomach.   Not Taking at Unknown time  . cefUROXime (CEFTIN) 250 MG tablet Take 1 tablet (250 mg total) by mouth 2 (two) times daily with a meal. (Patient not taking:  Reported on 05/25/2017) 14 tablet 0 Completed Course at Unknown time  . cholecalciferol (VITAMIN D) 1000 units tablet Take 2,000 Units by mouth daily.   Not Taking at Unknown time  . HYDROcodone-acetaminophen (NORCO) 5-325 MG tablet Take 1-2 tablets by mouth every 6 (six) hours as needed. (Patient not taking: Reported on 05/25/2017) 50 tablet 0 Not Taking at Unknown time  . metoCLOPramide (REGLAN) 10 MG tablet Take 1 tablet (10 mg total) by mouth every 6 (six) hours as needed. (Patient not taking: Reported on 01/03/2017) 12 tablet 0 Not Taking at Unknown time     Allergies  Allergen Reactions  . Trospium Nausea And Vomiting  . Iodinated Diagnostic Agents Hives  . Streptomycin Hives     Past Medical History:  Diagnosis Date  . Back pain   . Breast cancer ([place]) 2007   right breast lumpectomy with rad tx  . Cancer [facility]) 2007   right breast  . Collagen vascular disease ([place])   . Gout   . Hard of hearing   . Hypertension   . Parkinson's disease ([place])     Review of systems:      Physical Exam    Heart and lungs: Regular rate and rhythm without rub or gallop, lungs are bilaterally clear.    HEENT: Normocephalic atraumatic eyes are anicteric    Other:     Pertinant exam for procedure: Soft nontender nondistended bowel sounds positive normoactive.    Planned proceedures: EGD and indicated procedures. I have discussed the risks benefits and complications of procedures to include not limited to bleeding, infection, perforation and the risk of sedation and the patient wishes to proceed.    Lollie Sails, MD Gastroenterology 05/25/2017  1:27 PM

## 2017-05-25 NOTE — Anesthesia Post-op Follow-up Note (Signed)
Anesthesia QCDR form completed.        

## 2017-05-25 NOTE — Transfer of Care (Signed)
Immediate Anesthesia Transfer of Care Note  Patient: Yvonne Singleton  Procedure(s) Performed: Procedure(s): ESOPHAGOGASTRODUODENOSCOPY (EGD) WITH PROPOFOL (N/A)  Patient Location: PACU  Anesthesia Type:General  Level of Consciousness: awake  Airway & Oxygen Therapy: Patient Spontanous Breathing and Patient connected to nasal cannula oxygen  Post-op Assessment: Report given to RN and Post -op Vital signs reviewed and stable  Post vital signs: Reviewed  Last Vitals:  Vitals:   05/25/17 1241  BP: 120/63  Pulse: 61  Resp: 18  Temp: 36.7 C    Last Pain:  Vitals:   05/25/17 1241  TempSrc: Tympanic         Complications: No apparent anesthesia complications

## 2017-05-25 NOTE — Op Note (Signed)
[facility] Gastroenterology Patient Name: Yvonne Singleton Procedure Date: 05/25/2017 1:30 PM MRN: [id number] Account #: [id number] Date of Birth: [birth year] Admit Type: Outpatient Age: 82 Room: [facility] ENDO ROOM 1 Gender: Female Note Status: Finalized Procedure:            Upper GI endoscopy Indications:          Dysphagia, Abnormal abdominal x-ray of the GI tract Providers:            Lollie Sails, MD Referring MD:         Nino Glow Mclean-Scocuzza MD, MD (Referring MD) Medicines:            Monitored Anesthesia Care Complications:        No immediate complications. Procedure:            Pre-Anesthesia Assessment:                       - ASA Grade Assessment: III - A patient with severe                        systemic disease.                       After obtaining informed consent, the endoscope was                        passed under direct vision. Throughout the procedure,                        the patient's blood pressure, pulse, and oxygen                        saturations were monitored continuously. The Endoscope                        was introduced through the mouth, and advanced to the                        third part of duodenum. The upper GI endoscopy was                        accomplished without difficulty. Findings:      The mid esophagus and distal esophagus were significantly tortuous.      Abnormal motility was noted in the distal esophagus. The cricopharyngeus       was normal. There is spasticity of the esophageal body. The distal       esophagus/lower esophageal sphincter is spastic, but gives up passage to       the endoscope. Tertiary peristaltic waves are noted.      The Z-line was variable. Biopsies were taken with a cold forceps for       histology.      A medium to large-sized hiatal hernia was present. One half to 2/3 of       the stomach appears to be above the diaphragmatic hiatus.      Striped mildly erythematous mucosa without  bleeding was found in the       gastric antrum. Biopsies were taken with a cold forceps for histology.       Biopsies were taken with a cold forceps for Helicobacter pylori testing.       Retroflex evaluation  shows moderate to large hiatal hernia.      The examined duodenum was normal. Impression:           - Tortuous esophagus.                       - Abnormal esophageal motility, consistent with                        presbyesophagus.                       - Z-line variable. Biopsied.                       - Medium-sized hiatal hernia.                       - Erythematous mucosa in the antrum. Biopsied.                       - Normal examined duodenum. Recommendation:       - Continue present medications.                       - Refer to a surgeon at appointment to be scheduled for                        consultation/consideration of hernia repair. Procedure Code(s):    --- Professional ---                       [phone], Esophagogastroduodenoscopy, flexible, transoral;                        with biopsy, single or multiple Diagnosis Code(s):    --- Professional ---                       Q39.9, Congenital malformation of esophagus, unspecified                       K22.4, Dyskinesia of esophagus                       K22.8, Other specified diseases of esophagus                       K44.9, Diaphragmatic hernia without obstruction or                        gangrene                       K31.89, Other diseases of stomach and duodenum                       R13.10, Dysphagia, unspecified                       R93.3, Abnormal findings on diagnostic imaging of other                        parts of digestive tract CPT copyright 2016 American Medical Association. All rights reserved. The codes documented in this report are preliminary and upon coder review may  be revised  to meet current compliance requirements. Lollie Sails, MD 05/25/2017 2:09:42 PM This report has been signed  electronically. Number of Addenda: 0 Note Initiated On: 05/25/2017 1:30 PM      [facility]

## 2017-05-26 ENCOUNTER — Encounter: Payer: Self-pay | Admitting: Gastroenterology

## 2017-05-26 ENCOUNTER — Ambulatory Visit: Payer: Medicare Other

## 2017-05-27 LAB — SURGICAL PATHOLOGY

## 2017-05-30 DIAGNOSIS — E785 Hyperlipidemia, unspecified: Secondary | ICD-10-CM | POA: Insufficient documentation

## 2017-05-30 DIAGNOSIS — N200 Calculus of kidney: Secondary | ICD-10-CM | POA: Insufficient documentation

## 2017-05-30 DIAGNOSIS — K219 Gastro-esophageal reflux disease without esophagitis: Secondary | ICD-10-CM | POA: Insufficient documentation

## 2017-05-30 DIAGNOSIS — I10 Essential (primary) hypertension: Secondary | ICD-10-CM | POA: Insufficient documentation

## 2017-05-30 DIAGNOSIS — S66919A Strain of unspecified muscle, fascia and tendon at wrist and hand level, unspecified hand, initial encounter: Secondary | ICD-10-CM | POA: Insufficient documentation

## 2017-06-01 NOTE — Anesthesia Postprocedure Evaluation (Signed)
Anesthesia Post Note  Patient: Yvonne Singleton  Procedure(s) Performed: Procedure(s) (LRB): ESOPHAGOGASTRODUODENOSCOPY (EGD) WITH PROPOFOL (N/A)  Patient location during evaluation: PACU Anesthesia Type: General Level of consciousness: awake Pain management: pain level controlled Vital Signs Assessment: post-procedure vital signs reviewed and stable Respiratory status: spontaneous breathing Cardiovascular status: stable Anesthetic complications: no     Last Vitals:  Vitals:   05/25/17 1420 05/25/17 1430  BP: 137/63 134/73  Pulse: (!) 51 (!) 53  Resp: 16 16  Temp:    SpO2: 100%     Last Pain:  Vitals:   05/26/17 0742  TempSrc:   PainSc: 0-No pain                 VAN STAVEREN,[place] Leap

## 2017-06-03 DIAGNOSIS — K449 Diaphragmatic hernia without obstruction or gangrene: Secondary | ICD-10-CM | POA: Insufficient documentation

## 2017-10-13 ENCOUNTER — Encounter: Admission: RE | Payer: Self-pay | Source: Ambulatory Visit

## 2017-10-13 ENCOUNTER — Ambulatory Visit: Admission: RE | Admit: 2017-10-13 | Payer: Medicare Other | Source: Ambulatory Visit | Admitting: Gastroenterology

## 2017-10-13 SURGERY — ESOPHAGOGASTRODUODENOSCOPY (EGD) WITH PROPOFOL
Anesthesia: General

## 2017-11-01 ENCOUNTER — Telehealth: Payer: Self-pay | Admitting: Internal Medicine

## 2017-11-01 NOTE — Telephone Encounter (Signed)
Copied from [place] [id number]. Topic: Quick Communication - See Telephone Encounter >> Nov 01, 2017  9:40 AM Burnis Medin, NT wrote: CRM for notification. See Telephone encounter for: Rolla Plate is calling because he has sent over request for a refill for patient's DULoxetine (CYMBALTA) 30 MG capsule and hasn't heard back yet. It medication is approved it is ok to send an escript  11/01/17.

## 2017-11-01 NOTE — Telephone Encounter (Signed)
Please advise 

## 2017-11-01 NOTE — Telephone Encounter (Signed)
SPOKE   WITH   GREG  AT  [facility]  [phone]    LAST  FILL  DATE ON THE  CYMBALTA WAS  07/05/2017     CHART SHOWS   HISTORICAL  PROVIDER    COULD NOT LOCATE  LAST  APPROPRIATE  Office visit

## 2017-11-02 NOTE — Telephone Encounter (Addendum)
Spoke with [facility] script was filled for Cymbalta  Dr Humphrey Rolls yesterday.

## 2018-01-10 ENCOUNTER — Other Ambulatory Visit: Payer: Self-pay | Admitting: Internal Medicine

## 2018-01-10 DIAGNOSIS — Z1231 Encounter for screening mammogram for malignant neoplasm of breast: Secondary | ICD-10-CM

## 2018-01-24 ENCOUNTER — Ambulatory Visit
Admission: RE | Admit: 2018-01-24 | Discharge: 2018-01-24 | Disposition: A | Discharge status: Home / Self Care | Payer: Medicare Other | Source: Ambulatory Visit | Attending: Internal Medicine | Admitting: Internal Medicine

## 2018-01-24 DIAGNOSIS — Z1231 Encounter for screening mammogram for malignant neoplasm of breast: Secondary | ICD-10-CM

## 2018-01-24 HISTORY — DX: Personal history of irradiation: Z92.3

## 2018-03-16 ENCOUNTER — Ambulatory Visit: Payer: Medicare Other

## 2018-03-20 ENCOUNTER — Other Ambulatory Visit: Payer: Self-pay

## 2018-03-20 ENCOUNTER — Encounter: Payer: Self-pay | Admitting: Physical Therapy

## 2018-03-20 ENCOUNTER — Ambulatory Visit: Payer: Medicare Other | Attending: Neurology | Admitting: Physical Therapy

## 2018-03-20 DIAGNOSIS — R262 Difficulty in walking, not elsewhere classified: Secondary | ICD-10-CM | POA: Insufficient documentation

## 2018-03-20 DIAGNOSIS — M6281 Muscle weakness (generalized): Secondary | ICD-10-CM | POA: Diagnosis present

## 2018-03-20 DIAGNOSIS — R2681 Unsteadiness on feet: Secondary | ICD-10-CM | POA: Diagnosis present

## 2018-03-20 DIAGNOSIS — R278 Other lack of coordination: Secondary | ICD-10-CM | POA: Diagnosis present

## 2018-03-20 NOTE — Therapy (Signed)
[place] [facility] SERVICES [address] [place], [place], [address] Phone: [phone]   Fax:  [phone]  Physical Therapy Evaluation  Patient Details  Name: Yvonne Singleton MRN: [id number] Date of Birth: Dec 23, 1934 Referring Provider: Dr. Jennings Books   Encounter Date: 03/20/2018  PT End of Session - 03/20/18 1128    Visit Number  1    Number of Visits  13    Date for PT Re-Evaluation  05/01/18    Authorization Type  1/10 progress note    PT Start Time  1028    PT Stop Time  1118    PT Time Calculation (min)  50 min    Equipment Utilized During Treatment  Gait belt    Activity Tolerance  Patient tolerated treatment well    Behavior During Therapy  [facility] for tasks assessed/performed       Past Medical History:  Diagnosis Date  . Back pain   . Breast cancer ([place]) 2007   right breast lumpectomy with rad tx  . Cancer [facility]) 2007   right breast  . Collagen vascular disease ([place])   . Gout   . Hard of hearing   . Hypertension   . Parkinson's disease ([place])   . Personal history of radiation therapy 2007   F/U right breast cancer    Past Surgical History:  Procedure Laterality Date  . ABDOMINAL HYSTERECTOMY    . BREAST LUMPECTOMY Right 2007   suspicious calcs, f/u with radiation  . CATARACT EXTRACTION Bilateral   . ESOPHAGOGASTRODUODENOSCOPY (EGD) WITH PROPOFOL N/A 05/25/2017   Procedure: ESOPHAGOGASTRODUODENOSCOPY (EGD) WITH PROPOFOL;  Surgeon: Lollie Sails, MD;  Location: [facility] ENDOSCOPY;  Service: Endoscopy;  Laterality: N/A;  . EYE SURGERY Bilateral   . HAND SURGERY    . HIP CLOSED REDUCTION Left 06/24/2015   Procedure: CLOSED REDUCTION HIP;  Surgeon: Dereck Leep, MD;  Location: [facility] ORS;  Service: Orthopedics;  Laterality: Left;  . HIP CLOSED REDUCTION Left 02/09/2016   Procedure: CLOSED MANIPULATION HIP;  Surgeon: Earnestine Leys, MD;  Location: [facility] ORS;  Service: Orthopedics;  Laterality: Left;  . HIP SURGERY     . JOINT REPLACEMENT     bilateral hip  . OOPHORECTOMY    . TOTAL KNEE ARTHROPLASTY Bilateral     There were no vitals filed for this visit.   Subjective Assessment - 03/20/18 1051    Subjective  Pt presents with impaired balance and strength    Pertinent History  Pt arrived late to appointment, limiting evaluation. Pt presents with reports of imbalance and weakness.  Pt reports she stopped exercising this past winter so her balance has gotten worse.  Pt has been using Bil crutches at all times for 3-4 years.  Pt uses crutches instead of a walker due to chronic back and Bil wrist pain (R>L) due to gout and RA. Pt denies any falls in the past 6 months but has fallen in the past. Pt has had some issues with dizziness when taking Sinemet and Neurontin together so she has separated her schedule of taking these per Dr. Sharlet Salina. Pt reports she has trouble with mopping, vacuuming, sweeping, walking, outside work (flowers, etc).  Goals: to improve with all of these and to be able to do more in the kitchen.  Pt with h/o Bil TKA and THAs, back surgery. Pt reports her R THA dislocated x3 and L THA dislocated x2 but last time this happened was 3 years ago.  Pt  with h/o osteoporosis and RA.    Pt reports numbness and tingling and pain down LLE.  Pt additionally has numbness and tingling RLE but not as worse as on the L side.  These symptoms have been present for over a year and is being followed by Dr. Sharlet Salina for this.  Pt denies any bowel or bladder issues, saddle anesthesia, night sweats, sudden changes in weight.  Pt reports chronic pain in wrists, neck, back, Bil hip, BLEs.  Current pain in back: 4/10, best pain: 2/10, worst pain: 6/10.     Limitations  Walking;House hold activities;Standing    How long can you sit comfortably?  no issues    How long can you stand comfortably?  10 minutes    How long can you walk comfortably?  15 minutes    Diagnostic tests  Pt had MRI lumbar spine 07/08/15: L5-S1 mild disc  desiccation broad-based disc bulging extending to the R neural foramen, moderate to severe facet degenerative changes, moderate to severe R foraminal stenosis, L4-5 mild disc dessication broad-based disc bulging, moderate facet degenerative changes, moderate Bil lateral recess stenosis and mild to moderate central stenosis, mild Bil foraminal stenosis, at L3-4 there is severe degenerative disc disease, moderate to severe facet degenerative changes with mild central stenosis, mild Bil foraminal stenosis, at L2-3 there is severe degenerative disc disease, R hemilaminectomy defect, L1-2 mild disc dessication broad-based disc bulging. She has had periodic steroid injections with mild to moderate relief. Pt is followed at [facility] pain clinic for this. Plan is to schedule a L L5-S1 ESI if her pain worsens.    Patient Stated Goals  see above    Currently in Pain?  Yes    Pain Score  4     Pain Location  Back    Pain Orientation  Lower    Pain Descriptors / Indicators  Aching    Pain Type  Chronic pain    Pain Onset  More than a month ago    Pain Frequency  Constant         [facility] PT Assessment - 03/20/18 1037      Assessment   Medical Diagnosis  Balance    Referring Provider  Dr. Jennings Books    Onset Date/Surgical Date  03/20/12    Hand Dominance  Right    Next MD Visit  July 2019 to see PCP    Prior Therapy  Yes, with improvement      Precautions   Precautions  Fall    Precaution Comments  h/o osteoporosis and RA      Restrictions   Weight Bearing Restrictions  No      Balance Screen   Has the patient fallen in the past 6 months  No    Has the patient had a decrease in activity level because of a fear of falling?   Yes    Is the patient reluctant to leave their home because of a fear of falling?   No      Home Film/video editor residence    Living Arrangements  Spouse/significant other    Available Help at Discharge  Family    Type of [place] to enter    Entrance Stairs-Number of Steps  2    [place]  One level    [place];Wheelchair - manual;Toilet riser 3 wheeled walker  Prior Function   Level of Independence  Independent with household mobility with device;Independent with basic ADLs    Vocation  Retired    Leisure  Crossword puzzles, talk about grandchildren      Cognition   Overall Cognitive Status  Within Functional Limits for tasks assessed      Balance   Balance Assessed  Yes      Standardized Balance Assessment   Standardized Balance Assessment  Berg Balance Test      Berg Balance Test   Sit to Stand  Able to stand  independently using hands    Standing Unsupported  Able to stand 2 minutes with supervision    Sitting with Back Unsupported but Feet Supported on Floor or Stool  Able to sit safely and securely 2 minutes    Stand to Sit  Sits safely with minimal use of hands    Transfers  Able to transfer safely, definite need of hands    Standing Unsupported with Eyes Closed  Able to stand 10 seconds with supervision    Standing Ubsupported with Feet Together  Able to place feet together independently and stand for 1 minute with supervision    From Standing, Reach Forward with Outstretched Arm  Can reach forward >5 cm safely (2")    From Standing Position, Pick up Object from Floor  Unable to try/needs assist to keep balance pt does not do this due to h/o hip dislocations    From Standing Position, Turn to Look Behind Over each Shoulder  Looks behind one side only/other side shows less weight shift    Turn 360 Degrees  Needs assistance while turning    Standing Unsupported, Alternately Place Feet on Step/Stool  Needs assistance to keep from falling or unable to try    Standing Unsupported, One Foot in [employer] balance while stepping or standing    Standing on One Leg  Unable to try or needs assist to prevent fall    Total Score  28        EXAMINATION   5xSTS: 26.47 seconds (pt pushing on thighs to stand)   ABC Scale: 35%   9mT (ambulates with crutches): 0.52 m/s   Berg Balance Test: 28/56   TUG: 22.47 seconds   Sensation: diminished sensation to light touch BLEs (R more impaired than L)   Reflexes: +2 Bil patella and R achillles, +1 L achilles   Coordination: WNL BLE with heel to shin. Pt with h/o PD.   Gait Analysis (ambulates with crutches): flexed posture, Bil trendelenberg (L>R), dec Bil hip E, pt looking down toward the floor, dec Bil knee F        TREATMENT   Marching in sitting x15 each LE (added to HEP)   Sit<>stand x5 with pt pushing through thighs (added to HEP but instructed pt to have hands on armrests for balance and also to have chair next to countertop for safety in case pt were to lose her balance)           Objective measurements completed on examination: See above findings.              PT Education - 03/20/18 1050    Education Details  POC; role of PT; HEP and handout    Person(s) Educated  Patient    Methods  Explanation;Demonstration;Verbal cues;Handout    Comprehension  Verbalized understanding;Returned demonstration;Verbal cues required;Need further instruction       PT Short Term Goals - 03/20/18 1129  PT SHORT TERM GOAL #1   Title  Pt will independently complete HEP at least 4 days/wk for improved carryover between sessions    Time  2    Period  Weeks    Status  New        PT Long Term Goals - 03/20/18 1130      PT LONG TERM GOAL #1   Title  Pt's ABC scale will improve to at least 55% to demonstrate improvement in pt's perceived balance    Baseline  35%    Time  5    Period  Weeks    Status  New      PT LONG TERM GOAL #2   Title  Pt's 5xSTS will improve to equal to or less than 15 seconds to demonstrates improved balance and strength    Baseline  26.47 seconds (pt pushing on thighs to stand)    Time  6    Period  Weeks    Status   New      PT LONG TERM GOAL #3   Title  Pt's Berg Balance Test will improve to at least 45/56 to demonstrate improved balance    Baseline  28/56    Time  6    Period  Weeks    Status  New      PT LONG TERM GOAL #4   Title  Pt will improve 25mT time to at least 0.80 m/s for improved safety with ambulation in the community    Baseline  0.52 m/s using crutches    Time  5    Period  Weeks    Status  New      PT LONG TERM GOAL #5   Title  Pt's TUG will improve to equal to or less than 13 seconds to demonstrate improved balance, gait speed, and strength    Baseline  22.47 seconds    Time  5    Period  Weeks    Status  New             Plan - 03/20/18 1138    Clinical Impression Statement  Pt is a 83y/o F who presents with impaired balance and weakness which pt attributes to not exercising as much during the winter.  She has a h/o Parkinson's Disease and has been ambulating with crutches for 3-4 years now.  Her 5xSTS time indicated impaired balance and strength and the pt has to push on her thighs to boost to standing.  The pt's ABC scale indicates she has a significant perceived impairment with her balance.  She demonstrates a decreased gait speed on the 15m.  Her Berg  Balance Test indicates she is at an increased risk of falling. Pt's TUG score was lower than the norm for the pt's age and gender.  These results were explained to the patient.  The patient will benefit from skilled PT interventions for improved balance, strength, gait mechanics, and safety with all aspects of mobility.     History and Personal Factors relevant to plan of care:  (+) Pt has responded positively to therapy in the past (was a LSVT participant at this clinic), motivated to improve her strength and balance (-) chronicity of balance impairments, extensive amount of co-morbidities    Clinical Presentation  Stable    Clinical Presentation due to:  Pt's balance impairments have been predictable and long term  which has gradually worsened due to decreased activity level    Clinical Decision Making  Low    Rehab Potential  Good    PT Frequency  2x / week    PT Duration  6 weeks    PT Treatment/Interventions  ADLs/Self Care Home Management;Aquatic Therapy;Biofeedback;Cryotherapy;Electrical Stimulation;Iontophoresis 32m/ml Dexamethasone;Moist Heat;Traction;Ultrasound;DME Instruction;Gait training;Stair training;Functional mobility training;Therapeutic activities;Therapeutic exercise;Neuromuscular re-education;Balance training;Patient/family education;Manual techniques;Orthotic Fit/Training;Dry needling;Passive range of motion;Compression bandaging;Energy conservation;Taping    PT Next Visit Plan  progress strengthening program and introduce balance interventions    PT Home Exercise Plan  seated marching, sit<>stand    Recommended Other Services  none at this time as pt is already followed by Dr. CSharlet Salinafor LBP and BLE symptoms    Consulted and Agree with Plan of Care  Patient       Patient will benefit from skilled therapeutic intervention in order to improve the following deficits and impairments:  Abnormal gait, Decreased activity tolerance, Decreased balance, Decreased coordination, Decreased endurance, Decreased knowledge of use of DME, Decreased range of motion, Decreased mobility, Decreased safety awareness, Decreased strength, Difficulty walking, Hypomobility, Increased edema, Increased fascial restricitons, Increased muscle spasms, Impaired perceived functional ability, Impaired flexibility, Impaired sensation, Improper body mechanics, Postural dysfunction, Pain  Visit Diagnosis: Unsteadiness on feet  Muscle weakness (generalized)     Problem List Patient Active Problem List   Diagnosis Date Noted  . History of bilateral hip replacements 05/28/2015  . Spinal stenosis, lumbar region, with neurogenic claudication 05/28/2015  . DDD (degenerative disc disease), lumbar 03/13/2015  . Lumbar  post-laminectomy syndrome 03/13/2015  . Degenerative cervical disc 03/13/2015  . Status post bilateral total hip replacement 03/13/2015  . Sacroiliac joint dysfunction 03/13/2015  . History of surgery on upper extremity 03/13/2015  . DJD of shoulder 03/13/2015  . [place] 02/17/2015    ACollie SiadPT, DPT 03/20/2018, 11:54 AM  CJefferson DavisMAIN RDekalb HealthSERVICES [address] StreetRHalma NAl[place] [address]Phone: [phone]  Fax:  [phone] Name: GJalyn DuttaMRN: [id number] of Birth: [birth year]

## 2018-03-22 ENCOUNTER — Encounter: Payer: Self-pay | Admitting: Physical Therapy

## 2018-03-22 ENCOUNTER — Ambulatory Visit: Payer: Medicare Other | Admitting: Physical Therapy

## 2018-03-22 DIAGNOSIS — R2681 Unsteadiness on feet: Secondary | ICD-10-CM | POA: Diagnosis not present

## 2018-03-22 DIAGNOSIS — M6281 Muscle weakness (generalized): Secondary | ICD-10-CM

## 2018-03-22 DIAGNOSIS — R278 Other lack of coordination: Secondary | ICD-10-CM

## 2018-03-22 DIAGNOSIS — R262 Difficulty in walking, not elsewhere classified: Secondary | ICD-10-CM

## 2018-03-22 NOTE — Therapy (Signed)
[place] MAIN [facility] SERVICES [address] [place], [place], [address] Phone: [phone]   Fax:  [phone]  Physical Therapy Treatment  Patient Details  Name: Yvonne Singleton MRN: [id number] Date of Birth: 1935-08-29 Referring Provider: Dr. Jennings Books   Encounter Date: 03/22/2018  PT End of Session - 03/22/18 0937    Visit Number  2    Number of Visits  13    Date for PT Re-Evaluation  05/01/18    Authorization Type  2/10 progress note    PT Start Time  0930    PT Stop Time  1010    PT Time Calculation (min)  40 min    Equipment Utilized During Treatment  Gait belt    Activity Tolerance  Patient tolerated treatment well    Behavior During Therapy  [facility] for tasks assessed/performed       Past Medical History:  Diagnosis Date  . Back pain   . Breast cancer ([place]) 2007   right breast lumpectomy with rad tx  . Cancer [facility]) 2007   right breast  . Collagen vascular disease ([place])   . Gout   . Hard of hearing   . Hypertension   . Parkinson's disease ([place])   . Personal history of radiation therapy 2007   F/U right breast cancer    Past Surgical History:  Procedure Laterality Date  . ABDOMINAL HYSTERECTOMY    . BREAST LUMPECTOMY Right 2007   suspicious calcs, f/u with radiation  . CATARACT EXTRACTION Bilateral   . ESOPHAGOGASTRODUODENOSCOPY (EGD) WITH PROPOFOL N/A 05/25/2017   Procedure: ESOPHAGOGASTRODUODENOSCOPY (EGD) WITH PROPOFOL;  Surgeon: Lollie Sails, MD;  Location: [facility] ENDOSCOPY;  Service: Endoscopy;  Laterality: N/A;  . EYE SURGERY Bilateral   . HAND SURGERY    . HIP CLOSED REDUCTION Left 06/24/2015   Procedure: CLOSED REDUCTION HIP;  Surgeon: Dereck Leep, MD;  Location: [facility] ORS;  Service: Orthopedics;  Laterality: Left;  . HIP CLOSED REDUCTION Left 02/09/2016   Procedure: CLOSED MANIPULATION HIP;  Surgeon: Earnestine Leys, MD;  Location: [facility] ORS;  Service: Orthopedics;  Laterality: Left;  . HIP SURGERY    .  JOINT REPLACEMENT     bilateral hip  . OOPHORECTOMY    . TOTAL KNEE ARTHROPLASTY Bilateral     There were no vitals filed for this visit.  Subjective Assessment - 03/22/18 0936    Subjective  Pt presents with impaired balance and strength, no changes, no pain.    Pertinent History  Pt arrived late to appointment, limiting evaluation. Pt presents with reports of imbalance and weakness.  Pt reports she stopped exercising this past winter so her balance has gotten worse.  Pt has been using Bil crutches at all times for 3-4 years.  Pt uses crutches instead of a walker due to chronic back and Bil wrist pain (R>L) due to gout and RA. Pt denies any falls in the past 6 months but has fallen in the past. Pt has had some issues with dizziness when taking Sinemet and Neurontin together so she has separated her schedule of taking these per Dr. Sharlet Salina. Pt reports she has trouble with mopping, vacuuming, sweeping, walking, outside work (flowers, etc).  Goals: to improve with all of these and to be able to do more in the kitchen.  Pt with h/o Bil TKA and THAs, back surgery. Pt reports her R THA dislocated x3 and L THA dislocated x2 but last time this happened was 3 years  ago.  Pt with h/o osteoporosis and RA.    Pt reports numbness and tingling and pain down LLE.  Pt additionally has numbness and tingling RLE but not as worse as on the L side.  These symptoms have been present for over a year and is being followed by Dr. Sharlet Salina for this.  Pt denies any bowel or bladder issues, saddle anesthesia, night sweats, sudden changes in weight.  Pt reports chronic pain in wrists, neck, back, Bil hip, BLEs.  Current pain in back: 4/10, best pain: 2/10, worst pain: 6/10.     Limitations  Walking;House hold activities;Standing    How long can you sit comfortably?  no issues    How long can you stand comfortably?  10 minutes    How long can you walk comfortably?  15 minutes    Diagnostic tests  Pt had MRI lumbar spine 07/08/15:  L5-S1 mild disc desiccation broad-based disc bulging extending to the R neural foramen, moderate to severe facet degenerative changes, moderate to severe R foraminal stenosis, L4-5 mild disc dessication broad-based disc bulging, moderate facet degenerative changes, moderate Bil lateral recess stenosis and mild to moderate central stenosis, mild Bil foraminal stenosis, at L3-4 there is severe degenerative disc disease, moderate to severe facet degenerative changes with mild central stenosis, mild Bil foraminal stenosis, at L2-3 there is severe degenerative disc disease, R hemilaminectomy defect, L1-2 mild disc dessication broad-based disc bulging. She has had periodic steroid injections with mild to moderate relief. Pt is followed at [facility] pain clinic for this. Plan is to schedule a L L5-S1 ESI if her pain worsens.    Patient Stated Goals  see above    Currently in Pain?  No/denies    Pain Score  0-No pain    Pain Onset  More than a month ago      Treatment: Tapping from floor to 6 inch step with LLE x 6 ; patient has low back pain 5/10 after standing for 5 mins, needs to rest seated  Standing feet together and head turns left and right x 10 Standing feet modified tandem and head turns left and right x 10  Sidestepping left and right length of parallel bars x 3 , needs UE support and is unable to move RLE unless she has UE support Standing feet together  on foam and reaching and sorting balls with intermittent UE support x 73mins Standing feet modified tandem  on foam and reaching and sorting balls with intermittent UE support x 53mins; patient has low back pain and needs to sit for seated rest Side step with ladder in parallel bars x 3 laps with UE support  step with ladder in parallel bars x 3 laps with UE support  HEP progressed for : Hip abd standing x 15 Hip extension standing x 15  Patient has slow movement and needs to have trunk in fwd flex position to decrease her pain. She needs cues for  posture and correct technique has has reports of 5/10 in low back that decreases after she sits down .                           PT Education - 03/22/18 [phone]    Education Details  HEP    Person(s) Educated  Patient    Methods  Explanation    Comprehension  Verbalized understanding       PT Short Term Goals - 03/20/18 1129  PT SHORT TERM GOAL #1   Title  Pt will independently complete HEP at least 4 days/wk for improved carryover between sessions    Time  2    Period  Weeks    Status  New        PT Long Term Goals - 03/20/18 1130      PT LONG TERM GOAL #1   Title  Pt's ABC scale will improve to at least 55% to demonstrate improvement in pt's perceived balance    Baseline  35%    Time  5    Period  Weeks    Status  New      PT LONG TERM GOAL #2   Title  Pt's 5xSTS will improve to equal to or less than 15 seconds to demonstrates improved balance and strength    Baseline  26.47 seconds (pt pushing on thighs to stand)    Time  6    Period  Weeks    Status  New      PT LONG TERM GOAL #3   Title  Pt's Berg Balance Test will improve to at least 45/56 to demonstrate improved balance    Baseline  28/56    Time  6    Period  Weeks    Status  New      PT LONG TERM GOAL #4   Title  Pt will improve 62mWT time to at least 0.80 m/s for improved safety with ambulation in the community    Baseline  0.52 m/s using crutches    Time  5    Period  Weeks    Status  New      PT LONG TERM GOAL #5   Title  Pt's TUG will improve to equal to or less than 13 seconds to demonstrate improved balance, gait speed, and strength    Baseline  22.47 seconds    Time  5    Period  Weeks    Status  New            Plan - 03/22/18 6063    Clinical Impression Statement  Patient demonstrates deficits with postural control in tandem and narrow stance on purple foam. Patient demonstrated hesitation with full weight shifting and on uneven surfaces but minimal cueing  resulted in good technique and no LOB.  Patient improved ability to challenge dynamic balance with supervision and with UE assist today.   Patient will continue to benefit from skilled physical therapy to improve endurance and dynamic balance to reduce fall risk    Rehab Potential  Good    PT Frequency  2x / week    PT Duration  6 weeks    PT Treatment/Interventions  ADLs/Self Care Home Management;Aquatic Therapy;Biofeedback;Cryotherapy;Electrical Stimulation;Iontophoresis 4mg /ml Dexamethasone;Moist Heat;Traction;Ultrasound;DME Instruction;Gait training;Stair training;Functional mobility training;Therapeutic activities;Therapeutic exercise;Neuromuscular re-education;Balance training;Patient/family education;Manual techniques;Orthotic Fit/Training;Dry needling;Passive range of motion;Compression bandaging;Energy conservation;Taping    PT Next Visit Plan  progress strengthening program and introduce balance interventions    PT Home Exercise Plan  seated marching, sit<>stand    Consulted and Agree with Plan of Care  Patient       Patient will benefit from skilled therapeutic intervention in order to improve the following deficits and impairments:  Abnormal gait, Decreased activity tolerance, Decreased balance, Decreased coordination, Decreased endurance, Decreased knowledge of use of DME, Decreased range of motion, Decreased mobility, Decreased safety awareness, Decreased strength, Difficulty walking, Hypomobility, Increased edema, Increased fascial restricitons, Increased muscle spasms, Impaired perceived functional ability, Impaired flexibility, Impaired  sensation, Improper body mechanics, Postural dysfunction, Pain  Visit Diagnosis: Unsteadiness on feet  Muscle weakness (generalized)  Other lack of coordination  Difficulty in walking, not elsewhere classified     Problem List Patient Active Problem List   Diagnosis Date Noted  . History of bilateral hip replacements 05/28/2015  . Spinal  stenosis, lumbar region, with neurogenic claudication 05/28/2015  . DDD (degenerative disc disease), lumbar 03/13/2015  . Lumbar post-laminectomy syndrome 03/13/2015  . Degenerative cervical disc 03/13/2015  . Status post bilateral total hip replacement 03/13/2015  . Sacroiliac joint dysfunction 03/13/2015  . History of surgery on upper extremity 03/13/2015  . DJD of shoulder 03/13/2015  . Rheumatoid arthritis ([place]) 02/17/2015    Alanson Puls, [place] DPT 03/22/2018, 9:50 AM  [place] MAIN [facility] SERVICES [address] [place], [place], [address] Phone: [phone]   Fax:  [phone]  Name: Evey Mcmahan MRN: [id number] Date of Birth: [birth year]

## 2018-03-22 NOTE — Patient Instructions (Signed)
Hip Extension (Standing)    Stand with support. Squeeze pelvic floor and hold. Move right leg backward with straight knee. Repeat _15__ times. Do _2__ times a day. Repeat with other leg.    Copyright  VHI. All rights reserved.  Hip Abduction: Standing Side Leg Lift (Eccentric)    Lift leg out to side quickly. Slowly lower for 3-5 seconds. Hold support if needed. _15__ reps per set, _2__ sets per day, __7_ days per week.   http://ecce.exer.us/69   Copyright  VHI. All rights reserved.

## 2018-03-27 ENCOUNTER — Ambulatory Visit: Payer: Medicare Other | Admitting: Physical Therapy

## 2018-03-27 ENCOUNTER — Encounter: Payer: Self-pay | Admitting: Physical Therapy

## 2018-03-27 VITALS — BP 122/68 | HR 68

## 2018-03-27 DIAGNOSIS — R2681 Unsteadiness on feet: Secondary | ICD-10-CM | POA: Diagnosis not present

## 2018-03-27 DIAGNOSIS — R262 Difficulty in walking, not elsewhere classified: Secondary | ICD-10-CM

## 2018-03-27 DIAGNOSIS — M6281 Muscle weakness (generalized): Secondary | ICD-10-CM

## 2018-03-27 DIAGNOSIS — R278 Other lack of coordination: Secondary | ICD-10-CM

## 2018-03-27 NOTE — Therapy (Signed)
[place] MAIN [facility] SERVICES [address] [place], [place], [address] Phone: [phone]   Fax:  [phone]  Physical Therapy Treatment  Patient Details  Name: Yvonne Singleton MRN: [id number] Date of Birth: 10/08/1935 Referring Provider: Dr. Jennings Books   Encounter Date: 03/27/2018  PT End of Session - 03/27/18 1016    Visit Number  3    Number of Visits  13    Date for PT Re-Evaluation  05/01/18    Authorization Type  3/10 progress note    PT Start Time  1015    PT Stop Time  1057    PT Time Calculation (min)  42 min    Equipment Utilized During Treatment  Gait belt    Activity Tolerance  Patient tolerated treatment well    Behavior During Therapy  [facility] for tasks assessed/performed       Past Medical History:  Diagnosis Date  . Back pain   . Breast cancer ([place]) 2007   right breast lumpectomy with rad tx  . Cancer [facility]) 2007   right breast  . Collagen vascular disease ([place])   . Gout   . Hard of hearing   . Hypertension   . Parkinson's disease ([place])   . Personal history of radiation therapy 2007   F/U right breast cancer    Past Surgical History:  Procedure Laterality Date  . ABDOMINAL HYSTERECTOMY    . BREAST LUMPECTOMY Right 2007   suspicious calcs, f/u with radiation  . CATARACT EXTRACTION Bilateral   . ESOPHAGOGASTRODUODENOSCOPY (EGD) WITH PROPOFOL N/A 05/25/2017   Procedure: ESOPHAGOGASTRODUODENOSCOPY (EGD) WITH PROPOFOL;  Surgeon: Lollie Sails, MD;  Location: [facility] ENDOSCOPY;  Service: Endoscopy;  Laterality: N/A;  . EYE SURGERY Bilateral   . HAND SURGERY    . HIP CLOSED REDUCTION Left 06/24/2015   Procedure: CLOSED REDUCTION HIP;  Surgeon: Dereck Leep, MD;  Location: [facility] ORS;  Service: Orthopedics;  Laterality: Left;  . HIP CLOSED REDUCTION Left 02/09/2016   Procedure: CLOSED MANIPULATION HIP;  Surgeon: Earnestine Leys, MD;  Location: [facility] ORS;  Service: Orthopedics;  Laterality: Left;  . HIP SURGERY     . JOINT REPLACEMENT     bilateral hip  . OOPHORECTOMY    . TOTAL KNEE ARTHROPLASTY Bilateral     Vitals:   03/27/18 1023  BP: 122/68  Pulse: 68  SpO2: 97%    Subjective Assessment - 03/27/18 1019    Subjective  Pt denies any falls.  She reports feeling "shaky" and says this is on and off for her which she believes is due to her Parkinson's.  She denies pain.  Pt has been completed her HEP 6 days/wk.      Pertinent History  Pt arrived late to appointment, limiting evaluation. Pt presents with reports of imbalance and weakness.  Pt reports she stopped exercising this past winter so her balance has gotten worse.  Pt has been using Bil crutches at all times for 3-4 years.  Pt uses crutches instead of a walker due to chronic back and Bil wrist pain (R>L) due to gout and RA. Pt denies any falls in the past 6 months but has fallen in the past. Pt has had some issues with dizziness when taking Sinemet and Neurontin together so she has separated her schedule of taking these per Dr. Sharlet Salina. Pt reports she has trouble with mopping, vacuuming, sweeping, walking, outside work (flowers, etc).  Goals: to improve with all of these and to  be able to do more in the kitchen.  Pt with h/o Bil TKA and THAs, back surgery. Pt reports her R THA dislocated x3 and L THA dislocated x2 but last time this happened was 3 years ago.  Pt with h/o osteoporosis and RA.    Pt reports numbness and tingling and pain down LLE.  Pt additionally has numbness and tingling RLE but not as worse as on the L side.  These symptoms have been present for over a year and is being followed by Dr. Sharlet Salina for this.  Pt denies any bowel or bladder issues, saddle anesthesia, night sweats, sudden changes in weight.  Pt reports chronic pain in wrists, neck, back, Bil hip, BLEs.  Current pain in back: 4/10, best pain: 2/10, worst pain: 6/10.     Limitations  Walking;House hold activities;Standing    How long can you sit comfortably?  no issues    How  long can you stand comfortably?  10 minutes    How long can you walk comfortably?  15 minutes    Diagnostic tests  Pt had MRI lumbar spine 07/08/15: L5-S1 mild disc desiccation broad-based disc bulging extending to the R neural foramen, moderate to severe facet degenerative changes, moderate to severe R foraminal stenosis, L4-5 mild disc dessication broad-based disc bulging, moderate facet degenerative changes, moderate Bil lateral recess stenosis and mild to moderate central stenosis, mild Bil foraminal stenosis, at L3-4 there is severe degenerative disc disease, moderate to severe facet degenerative changes with mild central stenosis, mild Bil foraminal stenosis, at L2-3 there is severe degenerative disc disease, R hemilaminectomy defect, L1-2 mild disc dessication broad-based disc bulging. She has had periodic steroid injections with mild to moderate relief. Pt is followed at [facility] pain clinic for this. Plan is to schedule a L L5-S1 ESI if her pain worsens.    Patient Stated Goals  see above    Currently in Pain?  No/denies    Pain Onset  More than a month ago        TREATMENT   Crutches adjusted up one notch to promote improved upright posture followed by ambulating 232f in gym with improved upright posture   Alternating toe tapping to 6" step from floor x10 each LE with increased UE assist to step with RLE   Standing feet together and head turns left and right x 10   Standing feet modified tandem and head turns left and right x 10   Stepping forward and back over  foam roll x10 each direction. Pt initially reporting back pain which improved following cues to activate core.   Stepping sideways over  foam roll x10 each direction   Balancing on rockerboard in AP direction and in L/R direction   Seated marching with 5# ankle weights 2x15 each LE   LAQ with 5# ankle weights x20 each LE                         PT Education - 03/27/18 1016    Education Details   Exercise technique    Person(s) Educated  Patient    Methods  Explanation;Demonstration;Verbal cues    Comprehension  Verbalized understanding;Verbal cues required;Returned demonstration;Need further instruction       PT Short Term Goals - 03/20/18 1129      PT SHORT TERM GOAL #1   Title  Pt will independently complete HEP at least 4 days/wk for improved carryover between sessions    Time  2    Period  Weeks    Status  New        PT Long Term Goals - 03/20/18 1130      PT LONG TERM GOAL #1   Title  Pt's ABC scale will improve to at least 55% to demonstrate improvement in pt's perceived balance    Baseline  35%    Time  5    Period  Weeks    Status  New      PT LONG TERM GOAL #2   Title  Pt's 5xSTS will improve to equal to or less than 15 seconds to demonstrates improved balance and strength    Baseline  26.47 seconds (pt pushing on thighs to stand)    Time  6    Period  Weeks    Status  New      PT LONG TERM GOAL #3   Title  Pt's Berg Balance Test will improve to at least 45/56 to demonstrate improved balance    Baseline  28/56    Time  6    Period  Weeks    Status  New      PT LONG TERM GOAL #4   Title  Pt will improve 63mT time to at least 0.80 m/s for improved safety with ambulation in the community    Baseline  0.52 m/s using crutches    Time  5    Period  Weeks    Status  New      PT LONG TERM GOAL #5   Title  Pt's TUG will improve to equal to or less than 13 seconds to demonstrate improved balance, gait speed, and strength    Baseline  22.47 seconds    Time  5    Period  Weeks    Status  New            Plan - 03/27/18 1023    Clinical Impression Statement  Pt's crutches were adjusted up one notch with resultant improved upright posture while ambulating.  Pt reported back pain with stepping activity which decreased with cues to activate core.  Encouraged pt to activate core when she experiences back pain with daily activities.  Pt demonstrated  fatigue with seated strengthening exercises. Pt will benefit from continued skilled PT interventions for improved strength and balance for decreased fall risk and improved QOL.      Rehab Potential  Good    PT Frequency  2x / week    PT Duration  6 weeks    PT Treatment/Interventions  ADLs/Self Care Home Management;Aquatic Therapy;Biofeedback;Cryotherapy;Electrical Stimulation;Iontophoresis 431mml Dexamethasone;Moist Heat;Traction;Ultrasound;DME Instruction;Gait training;Stair training;Functional mobility training;Therapeutic activities;Therapeutic exercise;Neuromuscular re-education;Balance training;Patient/family education;Manual techniques;Orthotic Fit/Training;Dry needling;Passive range of motion;Compression bandaging;Energy conservation;Taping    PT Next Visit Plan  progress strengthening program and introduce balance interventions    PT Home Exercise Plan  seated marching, sit<>stand, standing hip Abd, standing hip E    Consulted and Agree with Plan of Care  Patient       Patient will benefit from skilled therapeutic intervention in order to improve the following deficits and impairments:  Abnormal gait, Decreased activity tolerance, Decreased balance, Decreased coordination, Decreased endurance, Decreased knowledge of use of DME, Decreased range of motion, Decreased mobility, Decreased safety awareness, Decreased strength, Difficulty walking, Hypomobility, Increased edema, Increased fascial restricitons, Increased muscle spasms, Impaired perceived functional ability, Impaired flexibility, Impaired sensation, Improper body mechanics, Postural dysfunction, Pain  Visit Diagnosis: Unsteadiness on feet  Muscle weakness (generalized)  Other lack  of coordination  Difficulty in walking, not elsewhere classified     Problem List Patient Active Problem List   Diagnosis Date Noted  . History of bilateral hip replacements 05/28/2015  . Spinal stenosis, lumbar region, with neurogenic  claudication 05/28/2015  . DDD (degenerative disc disease), lumbar 03/13/2015  . Lumbar post-laminectomy syndrome 03/13/2015  . Degenerative cervical disc 03/13/2015  . Status post bilateral total hip replacement 03/13/2015  . Sacroiliac joint dysfunction 03/13/2015  . History of surgery on upper extremity 03/13/2015  . DJD of shoulder 03/13/2015  . Rheumatoid arthritis ([place]) 02/17/2015    Collie Siad PT, DPT 03/27/2018, 10:55 AM  [place] MAIN [facility] SERVICES [address] [place], [place], [address] Phone: [phone]   Fax:  [phone]  Name: Rayana Geurin MRN: [id number] Date of Birth: 1935-03-13

## 2018-03-31 ENCOUNTER — Ambulatory Visit: Payer: Medicare Other

## 2018-03-31 DIAGNOSIS — R262 Difficulty in walking, not elsewhere classified: Secondary | ICD-10-CM

## 2018-03-31 DIAGNOSIS — R2681 Unsteadiness on feet: Secondary | ICD-10-CM

## 2018-03-31 DIAGNOSIS — M6281 Muscle weakness (generalized): Secondary | ICD-10-CM

## 2018-03-31 DIAGNOSIS — R278 Other lack of coordination: Secondary | ICD-10-CM

## 2018-03-31 NOTE — Therapy (Signed)
[place] MAIN [facility] SERVICES [address] [place], [place], [address] Phone: [phone]   Fax:  [phone]  Physical Therapy Treatment  Patient Details  Name: Yvonne Singleton MRN: [id number] Date of Birth: 02-19-1935 Referring Provider: Dr. Jennings Books   Encounter Date: 03/31/2018  PT End of Session - 03/31/18 1035    Visit Number  4    Number of Visits  13    Date for PT Re-Evaluation  05/01/18    Authorization Type  4/10 progress note    PT Start Time  1003    PT Stop Time  1045    PT Time Calculation (min)  42 min    Equipment Utilized During Treatment  Gait belt    Activity Tolerance  Patient tolerated treatment well    Behavior During Therapy  [facility] for tasks assessed/performed       Past Medical History:  Diagnosis Date  . Back pain   . Breast cancer ([place]) 2007   right breast lumpectomy with rad tx  . Cancer [facility]) 2007   right breast  . Collagen vascular disease ([place])   . Gout   . Hard of hearing   . Hypertension   . Parkinson's disease ([place])   . Personal history of radiation therapy 2007   F/U right breast cancer    Past Surgical History:  Procedure Laterality Date  . ABDOMINAL HYSTERECTOMY    . BREAST LUMPECTOMY Right 2007   suspicious calcs, f/u with radiation  . CATARACT EXTRACTION Bilateral   . ESOPHAGOGASTRODUODENOSCOPY (EGD) WITH PROPOFOL N/A 05/25/2017   Procedure: ESOPHAGOGASTRODUODENOSCOPY (EGD) WITH PROPOFOL;  Surgeon: Lollie Sails, MD;  Location: [facility] ENDOSCOPY;  Service: Endoscopy;  Laterality: N/A;  . EYE SURGERY Bilateral   . HAND SURGERY    . HIP CLOSED REDUCTION Left 06/24/2015   Procedure: CLOSED REDUCTION HIP;  Surgeon: Dereck Leep, MD;  Location: [facility] ORS;  Service: Orthopedics;  Laterality: Left;  . HIP CLOSED REDUCTION Left 02/09/2016   Procedure: CLOSED MANIPULATION HIP;  Surgeon: Earnestine Leys, MD;  Location: [facility] ORS;  Service: Orthopedics;  Laterality: Left;  . HIP SURGERY     . JOINT REPLACEMENT     bilateral hip  . OOPHORECTOMY    . TOTAL KNEE ARTHROPLASTY Bilateral     There were no vitals filed for this visit.  Subjective Assessment - 03/31/18 1007    Subjective  Patient reports constant back pain that affects her walking and balance. Reports compliance with HEP.     Pertinent History  Pt arrived late to appointment, limiting evaluation. Pt presents with reports of imbalance and weakness.  Pt reports she stopped exercising this past winter so her balance has gotten worse.  Pt has been using Bil crutches at all times for 3-4 years.  Pt uses crutches instead of a walker due to chronic back and Bil wrist pain (R>L) due to gout and RA. Pt denies any falls in the past 6 months but has fallen in the past. Pt has had some issues with dizziness when taking Sinemet and Neurontin together so she has separated her schedule of taking these per Dr. Sharlet Salina. Pt reports she has trouble with mopping, vacuuming, sweeping, walking, outside work (flowers, etc).  Goals: to improve with all of these and to be able to do more in the kitchen.  Pt with h/o Bil TKA and THAs, back surgery. Pt reports her R THA dislocated x3 and L THA dislocated x2 but last time  this happened was 3 years ago.  Pt with h/o osteoporosis and RA.    Pt reports numbness and tingling and pain down LLE.  Pt additionally has numbness and tingling RLE but not as worse as on the L side.  These symptoms have been present for over a year and is being followed by Dr. Sharlet Salina for this.  Pt denies any bowel or bladder issues, saddle anesthesia, night sweats, sudden changes in weight.  Pt reports chronic pain in wrists, neck, back, Bil hip, BLEs.  Current pain in back: 4/10, best pain: 2/10, worst pain: 6/10.     Limitations  Walking;House hold activities;Standing    How long can you sit comfortably?  no issues    How long can you stand comfortably?  10 minutes    How long can you walk comfortably?  15 minutes    Diagnostic  tests  Pt had MRI lumbar spine 07/08/15: L5-S1 mild disc desiccation broad-based disc bulging extending to the R neural foramen, moderate to severe facet degenerative changes, moderate to severe R foraminal stenosis, L4-5 mild disc dessication broad-based disc bulging, moderate facet degenerative changes, moderate Bil lateral recess stenosis and mild to moderate central stenosis, mild Bil foraminal stenosis, at L3-4 there is severe degenerative disc disease, moderate to severe facet degenerative changes with mild central stenosis, mild Bil foraminal stenosis, at L2-3 there is severe degenerative disc disease, R hemilaminectomy defect, L1-2 mild disc dessication broad-based disc bulging. She has had periodic steroid injections with mild to moderate relief. Pt is followed at [facility] pain clinic for this. Plan is to schedule a L L5-S1 ESI if her pain worsens.    Patient Stated Goals  see above    Currently in Pain?  Yes    Pain Score  5     Pain Location  Back    Pain Orientation  Lower    Pain Descriptors / Indicators  Aching    Pain Type  Chronic pain    Pain Onset  More than a month ago     seated adduction squeeze 10x 3 second holds ball   TrA activation seated 10x 3 second hold    Alternating toe tapping to 6" step from floor x10 each LE with increased UE assist to step with RLE; cues for core activation for decreased pain level.     Standing feet together and head turns left and right x 10     Stepping forward and back over  foam roll x10 each direction. Pt initially reporting back pain which improved following cues to activate core.       Seated marching with 5# ankle weights 2x15 each LE    LAQ with 5# ankle weights x20 each LE ; 2 sets   Agility ladder in // bars; one leg in each box to improve step length and gait sequencing.                            PT Education - 03/31/18 1035    Education Details  exercise technique, core activation     Person(s)  Educated  Patient    Methods  Explanation;Demonstration;Verbal cues    Comprehension  Verbalized understanding;Returned demonstration       PT Short Term Goals - 03/20/18 1129      PT SHORT TERM GOAL #1   Title  Pt will independently complete HEP at least 4 days/wk for improved carryover between sessions  Time  2    Period  Weeks    Status  New        PT Long Term Goals - 03/20/18 1130      PT LONG TERM GOAL #1   Title  Pt's ABC scale will improve to at least 55% to demonstrate improvement in pt's perceived balance    Baseline  35%    Time  5    Period  Weeks    Status  New      PT LONG TERM GOAL #2   Title  Pt's 5xSTS will improve to equal to or less than 15 seconds to demonstrates improved balance and strength    Baseline  26.47 seconds (pt pushing on thighs to stand)    Time  6    Period  Weeks    Status  New      PT LONG TERM GOAL #3   Title  Pt's Berg Balance Test will improve to at least 45/56 to demonstrate improved balance    Baseline  28/56    Time  6    Period  Weeks    Status  New      PT LONG TERM GOAL #4   Title  Pt will improve 12mWT time to at least 0.80 m/s for improved safety with ambulation in the community    Baseline  0.52 m/s using crutches    Time  5    Period  Weeks    Status  New      PT LONG TERM GOAL #5   Title  Pt's TUG will improve to equal to or less than 13 seconds to demonstrate improved balance, gait speed, and strength    Baseline  22.47 seconds    Time  5    Period  Weeks    Status  New            Plan - 03/31/18 1046    Clinical Impression Statement  Patient demonstrated ability to activate core resulting in decreased strain on low back allowing for improved capacity. Patient challenged by step length increase initially however improved with repetition. Pt will benefit from continued skilled PT interventions for improved strength and balance for decreased fall risk and improved QOL    Rehab Potential  Good    PT  Frequency  2x / week    PT Duration  6 weeks    PT Treatment/Interventions  ADLs/Self Care Home Management;Aquatic Therapy;Biofeedback;Cryotherapy;Electrical Stimulation;Iontophoresis 4mg /ml Dexamethasone;Moist Heat;Traction;Ultrasound;DME Instruction;Gait training;Stair training;Functional mobility training;Therapeutic activities;Therapeutic exercise;Neuromuscular re-education;Balance training;Patient/family education;Manual techniques;Orthotic Fit/Training;Dry needling;Passive range of motion;Compression bandaging;Energy conservation;Taping    PT Next Visit Plan  progress strengthening program and introduce balance interventions    PT Home Exercise Plan  seated marching, sit<>stand, standing hip Abd, standing hip E    Consulted and Agree with Plan of Care  Patient       Patient will benefit from skilled therapeutic intervention in order to improve the following deficits and impairments:  Abnormal gait, Decreased activity tolerance, Decreased balance, Decreased coordination, Decreased endurance, Decreased knowledge of use of DME, Decreased range of motion, Decreased mobility, Decreased safety awareness, Decreased strength, Difficulty walking, Hypomobility, Increased edema, Increased fascial restricitons, Increased muscle spasms, Impaired perceived functional ability, Impaired flexibility, Impaired sensation, Improper body mechanics, Postural dysfunction, Pain  Visit Diagnosis: Unsteadiness on feet  Muscle weakness (generalized)  Other lack of coordination  Difficulty in walking, not elsewhere classified     Problem List Patient Active Problem List   Diagnosis Date  Noted  . History of bilateral hip replacements 05/28/2015  . Spinal stenosis, lumbar region, with neurogenic claudication 05/28/2015  . DDD (degenerative disc disease), lumbar 03/13/2015  . Lumbar post-laminectomy syndrome 03/13/2015  . Degenerative cervical disc 03/13/2015  . Status post bilateral total hip replacement  03/13/2015  . Sacroiliac joint dysfunction 03/13/2015  . History of surgery on upper extremity 03/13/2015  . DJD of shoulder 03/13/2015  . Rheumatoid arthritis ([place]) 02/17/2015   Janna Arch, PT, DPT   03/31/2018, 10:48 AM  [place] MAIN [facility] SERVICES [address] [place], [place], [address] Phone: [phone]   Fax:  [phone]  Name: Yvonne Singleton MRN: [id number] Date of Birth: May 31, 1935

## 2018-04-03 ENCOUNTER — Ambulatory Visit: Payer: Medicare Other

## 2018-04-05 ENCOUNTER — Encounter: Payer: Self-pay | Admitting: Physical Therapy

## 2018-04-05 ENCOUNTER — Ambulatory Visit: Payer: Medicare Other | Admitting: Physical Therapy

## 2018-04-05 DIAGNOSIS — R278 Other lack of coordination: Secondary | ICD-10-CM

## 2018-04-05 DIAGNOSIS — R2681 Unsteadiness on feet: Secondary | ICD-10-CM

## 2018-04-05 DIAGNOSIS — R262 Difficulty in walking, not elsewhere classified: Secondary | ICD-10-CM

## 2018-04-05 DIAGNOSIS — M6281 Muscle weakness (generalized): Secondary | ICD-10-CM

## 2018-04-05 NOTE — Therapy (Signed)
[place] MAIN [facility] SERVICES [address] [place], [place], [address] Phone: [phone]   Fax:  [phone]  Physical Therapy Treatment  Patient Details  Name: Yvonne Singleton MRN: [id number] Date of Birth: 1935/02/05 Referring Provider: Dr. Jennings Books   Encounter Date: 04/05/2018  PT End of Session - 04/05/18 0931    Visit Number  5    Number of Visits  13    Date for PT Re-Evaluation  05/01/18    Authorization Type  5/10 progress note    PT Start Time  0930    PT Stop Time  1010    PT Time Calculation (min)  40 min    Equipment Utilized During Treatment  Gait belt    Activity Tolerance  Patient tolerated treatment well    Behavior During Therapy  [facility] for tasks assessed/performed       Past Medical History:  Diagnosis Date  . Back pain   . Breast cancer ([place]) 2007   right breast lumpectomy with rad tx  . Cancer [facility]) 2007   right breast  . Collagen vascular disease ([place])   . Gout   . Hard of hearing   . Hypertension   . Parkinson's disease ([place])   . Personal history of radiation therapy 2007   F/U right breast cancer    Past Surgical History:  Procedure Laterality Date  . ABDOMINAL HYSTERECTOMY    . BREAST LUMPECTOMY Right 2007   suspicious calcs, f/u with radiation  . CATARACT EXTRACTION Bilateral   . ESOPHAGOGASTRODUODENOSCOPY (EGD) WITH PROPOFOL N/A 05/25/2017   Procedure: ESOPHAGOGASTRODUODENOSCOPY (EGD) WITH PROPOFOL;  Surgeon: Lollie Sails, MD;  Location: [facility] ENDOSCOPY;  Service: Endoscopy;  Laterality: N/A;  . EYE SURGERY Bilateral   . HAND SURGERY    . HIP CLOSED REDUCTION Left 06/24/2015   Procedure: CLOSED REDUCTION HIP;  Surgeon: Dereck Leep, MD;  Location: [facility] ORS;  Service: Orthopedics;  Laterality: Left;  . HIP CLOSED REDUCTION Left 02/09/2016   Procedure: CLOSED MANIPULATION HIP;  Surgeon: Earnestine Leys, MD;  Location: [facility] ORS;  Service: Orthopedics;  Laterality: Left;  . HIP SURGERY     . JOINT REPLACEMENT     bilateral hip  . OOPHORECTOMY    . TOTAL KNEE ARTHROPLASTY Bilateral     There were no vitals filed for this visit.  Subjective Assessment - 04/05/18 0931    Subjective  Patient reports constant back pain that affects her walking and balance. Reports compliance with HEP.     Pertinent History  Pt arrived late to appointment, limiting evaluation. Pt presents with reports of imbalance and weakness.  Pt reports she stopped exercising this past winter so her balance has gotten worse.  Pt has been using Bil crutches at all times for 3-4 years.  Pt uses crutches instead of a walker due to chronic back and Bil wrist pain (R>L) due to gout and RA. Pt denies any falls in the past 6 months but has fallen in the past. Pt has had some issues with dizziness when taking Sinemet and Neurontin together so she has separated her schedule of taking these per Dr. Sharlet Salina. Pt reports she has trouble with mopping, vacuuming, sweeping, walking, outside work (flowers, etc).  Goals: to improve with all of these and to be able to do more in the kitchen.  Pt with h/o Bil TKA and THAs, back surgery. Pt reports her R THA dislocated x3 and L THA dislocated x2 but last time  this happened was 3 years ago.  Pt with h/o osteoporosis and RA.    Pt reports numbness and tingling and pain down LLE.  Pt additionally has numbness and tingling RLE but not as worse as on the L side.  These symptoms have been present for over a year and is being followed by Dr. Sharlet Salina for this.  Pt denies any bowel or bladder issues, saddle anesthesia, night sweats, sudden changes in weight.  Pt reports chronic pain in wrists, neck, back, Bil hip, BLEs.  Current pain in back: 4/10, best pain: 2/10, worst pain: 6/10.     Limitations  Walking;House hold activities;Standing    How long can you sit comfortably?  no issues    How long can you stand comfortably?  10 minutes    How long can you walk comfortably?  15 minutes    Diagnostic  tests  Pt had MRI lumbar spine 07/08/15: L5-S1 mild disc desiccation broad-based disc bulging extending to the R neural foramen, moderate to severe facet degenerative changes, moderate to severe R foraminal stenosis, L4-5 mild disc dessication broad-based disc bulging, moderate facet degenerative changes, moderate Bil lateral recess stenosis and mild to moderate central stenosis, mild Bil foraminal stenosis, at L3-4 there is severe degenerative disc disease, moderate to severe facet degenerative changes with mild central stenosis, mild Bil foraminal stenosis, at L2-3 there is severe degenerative disc disease, R hemilaminectomy defect, L1-2 mild disc dessication broad-based disc bulging. She has had periodic steroid injections with mild to moderate relief. Pt is followed at [facility] pain clinic for this. Plan is to schedule a L L5-S1 ESI if her pain worsens.    Patient Stated Goals  see above    Currently in Pain?  Yes    Pain Score  4     Pain Location  Back    Pain Orientation  Lower    Pain Descriptors / Indicators  Aching    Pain Type  Chronic pain    Pain Onset  More than a month ago    Aggravating Factors   walking, sitting    Pain Relieving Factors  ice    Effect of Pain on Daily Activities  unable to do standing activities    Multiple Pain Sites  No      Treatment:  Neuromuscular training:  fwd and bwd side stepping over 1/2 foam roll with no UE support, 1x15 each, CGA for balance, min cues for heel strike with fwd step over bil toe taps on 6" step, 2x10 each; cues for control of LE Standing slow marching without UE support 2 x 10;cues for head position Toe taps to stepping stones  x 10 bilateral, alternating; cues for posture and foot position   Therapeutic exercise: Leg press x 90 lbs x 15 x 2 Step ups to 6 inch stool with UE support x 15  Hooklying marching x 10 x 10 hooklying hip abd/ER x 20     CGA and Min verbal cues used throughout with increased in postural sway and LOB most  seen with narrow base of support and while on uneven surfaces. Continues to have balance deficits typical with diagnosis. Patient performs intermediate level exercises with pain behaviors and needs verbal cuing for postural alignment and head positioning                   PT Education - 04/05/18 0930    Education Details  HEP    Person(s) Educated  Patient  Methods  Explanation;Demonstration;Tactile cues;Verbal cues    Comprehension  Verbalized understanding;Returned demonstration       PT Short Term Goals - 03/20/18 1129      PT SHORT TERM GOAL #1   Title  Pt will independently complete HEP at least 4 days/wk for improved carryover between sessions    Time  2    Period  Weeks    Status  New        PT Long Term Goals - 03/20/18 1130      PT LONG TERM GOAL #1   Title  Pt's ABC scale will improve to at least 55% to demonstrate improvement in pt's perceived balance    Baseline  35%    Time  5    Period  Weeks    Status  New      PT LONG TERM GOAL #2   Title  Pt's 5xSTS will improve to equal to or less than 15 seconds to demonstrates improved balance and strength    Baseline  26.47 seconds (pt pushing on thighs to stand)    Time  6    Period  Weeks    Status  New      PT LONG TERM GOAL #3   Title  Pt's Berg Balance Test will improve to at least 45/56 to demonstrate improved balance    Baseline  28/56    Time  6    Period  Weeks    Status  New      PT LONG TERM GOAL #4   Title  Pt will improve 74mWT time to at least 0.80 m/s for improved safety with ambulation in the community    Baseline  0.52 m/s using crutches    Time  5    Period  Weeks    Status  New      PT LONG TERM GOAL #5   Title  Pt's TUG will improve to equal to or less than 13 seconds to demonstrate improved balance, gait speed, and strength    Baseline  22.47 seconds    Time  5    Period  Weeks    Status  New            Plan - 04/05/18 0943    Clinical Impression Statement   Introduced stepping activity to 6" step to work on balance and strengthening and pt with fear of falling with this, requiring intermittent UE assist. Pt demonstrates some unsteadiness with head turns when ambulating. He demonstrates fatigue with strengthening of BLEs. Patient will benefit from continued skilled PT interventions for improved strength and balance    Rehab Potential  Good    PT Frequency  2x / week    PT Duration  6 weeks    PT Treatment/Interventions  ADLs/Self Care Home Management;Aquatic Therapy;Biofeedback;Cryotherapy;Electrical Stimulation;Iontophoresis 4mg /ml Dexamethasone;Moist Heat;Traction;Ultrasound;DME Instruction;Gait training;Stair training;Functional mobility training;Therapeutic activities;Therapeutic exercise;Neuromuscular re-education;Balance training;Patient/family education;Manual techniques;Orthotic Fit/Training;Dry needling;Passive range of motion;Compression bandaging;Energy conservation;Taping    PT Next Visit Plan  progress strengthening program and introduce balance interventions    PT Home Exercise Plan  seated marching, sit<>stand, standing hip Abd, standing hip E    Consulted and Agree with Plan of Care  Patient       Patient will benefit from skilled therapeutic intervention in order to improve the following deficits and impairments:  Abnormal gait, Decreased activity tolerance, Decreased balance, Decreased coordination, Decreased endurance, Decreased knowledge of use of DME, Decreased range of motion, Decreased mobility, Decreased safety awareness, Decreased  strength, Difficulty walking, Hypomobility, Increased edema, Increased fascial restricitons, Increased muscle spasms, Impaired perceived functional ability, Impaired flexibility, Impaired sensation, Improper body mechanics, Postural dysfunction, Pain  Visit Diagnosis: Unsteadiness on feet  Muscle weakness (generalized)  Other lack of coordination  Difficulty in walking, not elsewhere  classified     Problem List Patient Active Problem List   Diagnosis Date Noted  . History of bilateral hip replacements 05/28/2015  . Spinal stenosis, lumbar region, with neurogenic claudication 05/28/2015  . DDD (degenerative disc disease), lumbar 03/13/2015  . Lumbar post-laminectomy syndrome 03/13/2015  . Degenerative cervical disc 03/13/2015  . Status post bilateral total hip replacement 03/13/2015  . Sacroiliac joint dysfunction 03/13/2015  . History of surgery on upper extremity 03/13/2015  . DJD of shoulder 03/13/2015  . Rheumatoid arthritis ([place]) 02/17/2015    Alanson Puls, [place] DPT 04/05/2018, 9:45 AM  [place] MAIN [facility] SERVICES [address] [place], [place], [address] Phone: [phone]   Fax:  [phone]  Name: Yvonne Singleton MRN: [id number] Date of Birth: Aug 25, 1935

## 2018-04-10 ENCOUNTER — Ambulatory Visit: Payer: Medicare Other | Admitting: Physical Therapy

## 2018-04-10 DIAGNOSIS — R262 Difficulty in walking, not elsewhere classified: Secondary | ICD-10-CM

## 2018-04-10 DIAGNOSIS — R2681 Unsteadiness on feet: Secondary | ICD-10-CM | POA: Diagnosis not present

## 2018-04-10 DIAGNOSIS — M6281 Muscle weakness (generalized): Secondary | ICD-10-CM

## 2018-04-10 DIAGNOSIS — R278 Other lack of coordination: Secondary | ICD-10-CM

## 2018-04-10 NOTE — Therapy (Signed)
[place] MAIN [facility] SERVICES [address] [place], [place], [address] Phone: [phone]   Fax:  [phone]  Physical Therapy Treatment  Patient Details  Name: Yvonne Singleton MRN: [id number] Date of Birth: 1935-04-03 Referring Provider: Dr. Jennings Books   Encounter Date: 04/10/2018  PT End of Session - 04/10/18 0933    Visit Number  5    Number of Visits  13    Date for PT Re-Evaluation  05/01/18    Authorization Type  5/10 progress note    PT Start Time  0927    PT Stop Time  1007    PT Time Calculation (min)  40 min    Activity Tolerance  Patient tolerated treatment well;No increased pain    Behavior During Therapy  WFL for tasks assessed/performed       Past Medical History:  Diagnosis Date  . Back pain   . Breast cancer ([place]) 2007   right breast lumpectomy with rad tx  . Cancer [facility]) 2007   right breast  . Collagen vascular disease ([place])   . Gout   . Hard of hearing   . Hypertension   . Parkinson's disease ([place])   . Personal history of radiation therapy 2007   F/U right breast cancer    Past Surgical History:  Procedure Laterality Date  . ABDOMINAL HYSTERECTOMY    . BREAST LUMPECTOMY Right 2007   suspicious calcs, f/u with radiation  . CATARACT EXTRACTION Bilateral   . ESOPHAGOGASTRODUODENOSCOPY (EGD) WITH PROPOFOL N/A 05/25/2017   Procedure: ESOPHAGOGASTRODUODENOSCOPY (EGD) WITH PROPOFOL;  Surgeon: Lollie Sails, MD;  Location: [facility] ENDOSCOPY;  Service: Endoscopy;  Laterality: N/A;  . EYE SURGERY Bilateral   . HAND SURGERY    . HIP CLOSED REDUCTION Left 06/24/2015   Procedure: CLOSED REDUCTION HIP;  Surgeon: Dereck Leep, MD;  Location: [facility] ORS;  Service: Orthopedics;  Laterality: Left;  . HIP CLOSED REDUCTION Left 02/09/2016   Procedure: CLOSED MANIPULATION HIP;  Surgeon: Earnestine Leys, MD;  Location: [facility] ORS;  Service: Orthopedics;  Laterality: Left;  . HIP SURGERY    . JOINT REPLACEMENT      bilateral hip  . OOPHORECTOMY    . TOTAL KNEE ARTHROPLASTY Bilateral     There were no vitals filed for this visit.  Subjective Assessment - 04/10/18 0931    Subjective  Pt reports she is doing well, a nic equiet weekend, went to church. No recent balance/falls issues. HEP is going well but still has difficulty with standing hip ABDCT on the RLE.     Pertinent History  Pt arrived late to appointment, limiting evaluation. Pt presents with reports of imbalance and weakness.  Pt reports she stopped exercising this past winter so her balance has gotten worse.  Pt has been using Bil crutches at all times for 3-4 years.  Pt uses crutches instead of a walker due to chronic back and Bil wrist pain (R>L) due to gout and RA. Pt denies any falls in the past 6 months but has fallen in the past. Pt has had some issues with dizziness when taking Sinemet and Neurontin together so she has separated her schedule of taking these per Dr. Sharlet Salina. Pt reports she has trouble with mopping, vacuuming, sweeping, walking, outside work (flowers, etc).  Goals: to improve with all of these and to be able to do more in the kitchen.  Pt with h/o Bil TKA and THAs, back surgery. Pt reports her R THA dislocated  x3 and L THA dislocated x2 but last time this happened was 3 years ago.  Pt with h/o osteoporosis and RA.    Pt reports numbness and tingling and pain down LLE.  Pt additionally has numbness and tingling RLE but not as worse as on the L side.  These symptoms have been present for over a year and is being followed by Dr. Sharlet Salina for this.  Pt denies any bowel or bladder issues, saddle anesthesia, night sweats, sudden changes in weight.  Pt reports chronic pain in wrists, neck, back, Bil hip, BLEs.  Current pain in back: 4/10, best pain: 2/10, worst pain: 6/10.     Currently in Pain?  Yes    Pain Score  3     Pain Location  -- Her typical, baseline low back pain.        Therapeutic Intervention This Date:   Neuromuscular  reeducaiton: -fwd, bwd, and side stepping over 1/2 foam roll with single AC support, 1x15 each bilat, CGA for balance  -bil toe taps on 6" step, 1x15 each; RUE support needed -Standing slow marching without UE support 2 x 10;cues for head position   Therapeutic exercise: -sit to stand from chair: 2x10, hands free -Leg press x 90 lbs  2x15 -Supine LLE Abduction Heel Slides: 3x15 -Hooklying Bridge: 2x10 -Hooklying marching 2x15 -hooklying clamshell with band: 3x15 (light blue band)    PT Short Term Goals - 03/20/18 1129      PT SHORT TERM GOAL #1   Title  Pt will independently complete HEP at least 4 days/wk for improved carryover between sessions    Time  2    Period  Weeks    Status  New        PT Long Term Goals - 03/20/18 1130      PT LONG TERM GOAL #1   Title  Pt's ABC scale will improve to at least 55% to demonstrate improvement in pt's perceived balance    Baseline  35%    Time  5    Period  Weeks    Status  New      PT LONG TERM GOAL #2   Title  Pt's 5xSTS will improve to equal to or less than 15 seconds to demonstrates improved balance and strength    Baseline  26.47 seconds (pt pushing on thighs to stand)    Time  6    Period  Weeks    Status  New      PT LONG TERM GOAL #3   Title  Pt's Berg Balance Test will improve to at least 45/56 to demonstrate improved balance    Baseline  28/56    Time  6    Period  Weeks    Status  New      PT LONG TERM GOAL #4   Title  Pt will improve 4mWT time to at least 0.80 m/s for improved safety with ambulation in the community    Baseline  0.52 m/s using crutches    Time  5    Period  Weeks    Status  New      PT LONG TERM GOAL #5   Title  Pt's TUG will improve to equal to or less than 13 seconds to demonstrate improved balance, gait speed, and strength    Baseline  22.47 seconds    Time  5    Period  Weeks    Status  New  Plan - 04/10/18 0941    Clinical Impression Statement  Continued with  current program, focus on strength and balance. Pt progressinvg slowly, but remains motivated. Difficulty progressing balance activity without use of UE support and/or AC support. LLE suport in standing remains failrly limited, and patient confidence is very low. Pt cued to maintain TKE in SLS stance for improved stability. Left hip abduction strengthening moved to supine, with noted ROM restriction and tightness in the adductor brevis/longus.     Rehab Potential  Good    PT Frequency  2x / week    PT Duration  6 weeks    PT Treatment/Interventions  ADLs/Self Care Home Management;Aquatic Therapy;Biofeedback;Cryotherapy;Electrical Stimulation;Iontophoresis 4mg /ml Dexamethasone;Moist Heat;Traction;Ultrasound;DME Instruction;Gait training;Stair training;Functional mobility training;Therapeutic activities;Therapeutic exercise;Neuromuscular re-education;Balance training;Patient/family education;Manual techniques;Orthotic Fit/Training;Dry needling;Passive range of motion;Compression bandaging;Energy conservation;Taping    PT Next Visit Plan  progress strengthening program and introduce balance interventions    PT Home Exercise Plan  seated marching, sit<>stand, standing hip Abd, standing hip E    Consulted and Agree with Plan of Care  Patient       Patient will benefit from skilled therapeutic intervention in order to improve the following deficits and impairments:  Abnormal gait, Decreased activity tolerance, Decreased balance, Decreased coordination, Decreased endurance, Decreased knowledge of use of DME, Decreased range of motion, Decreased mobility, Decreased safety awareness, Decreased strength, Difficulty walking, Hypomobility, Increased edema, Increased fascial restricitons, Increased muscle spasms, Impaired perceived functional ability, Impaired flexibility, Impaired sensation, Improper body mechanics, Postural dysfunction, Pain  Visit Diagnosis: Unsteadiness on feet  Muscle weakness  (generalized)  Other lack of coordination  Difficulty in walking, not elsewhere classified     Problem List Patient Active Problem List   Diagnosis Date Noted  . History of bilateral hip replacements 05/28/2015  . Spinal stenosis, lumbar region, with neurogenic claudication 05/28/2015  . DDD (degenerative disc disease), lumbar 03/13/2015  . Lumbar post-laminectomy syndrome 03/13/2015  . Degenerative cervical disc 03/13/2015  . Status post bilateral total hip replacement 03/13/2015  . Sacroiliac joint dysfunction 03/13/2015  . History of surgery on upper extremity 03/13/2015  . DJD of shoulder 03/13/2015  . Rheumatoid arthritis ([place]) 02/17/2015   10:02 AM, 04/10/18 Etta Grandchild, PT, DPT Physical Therapist - [facility]  Outpatient Physical Therapy- [place] [phone]     Etta Grandchild 04/10/2018, 9:55 AM  [place] MAIN [facility] SERVICES [address] [place], [place], [address] Phone: [phone]   Fax:  [phone]  Name: Yvonne Singleton MRN: [id number] Date of Birth: 06/09/1935

## 2018-04-12 ENCOUNTER — Ambulatory Visit: Payer: Medicare Other | Admitting: Physical Therapy

## 2018-04-17 ENCOUNTER — Ambulatory Visit: Payer: Medicare Other | Attending: Internal Medicine | Admitting: Physical Therapy

## 2018-04-17 DIAGNOSIS — M6281 Muscle weakness (generalized): Secondary | ICD-10-CM | POA: Insufficient documentation

## 2018-04-17 DIAGNOSIS — R262 Difficulty in walking, not elsewhere classified: Secondary | ICD-10-CM | POA: Insufficient documentation

## 2018-04-17 DIAGNOSIS — R2681 Unsteadiness on feet: Secondary | ICD-10-CM | POA: Insufficient documentation

## 2018-04-17 DIAGNOSIS — R278 Other lack of coordination: Secondary | ICD-10-CM | POA: Insufficient documentation

## 2018-04-24 ENCOUNTER — Ambulatory Visit: Payer: Medicare Other | Admitting: Physical Therapy

## 2018-04-26 ENCOUNTER — Ambulatory Visit: Payer: Medicare Other | Admitting: Physical Therapy

## 2018-05-01 ENCOUNTER — Ambulatory Visit: Payer: Medicare Other | Admitting: Physical Therapy

## 2018-05-01 ENCOUNTER — Encounter: Payer: Self-pay | Admitting: Physical Therapy

## 2018-05-01 DIAGNOSIS — R278 Other lack of coordination: Secondary | ICD-10-CM | POA: Diagnosis present

## 2018-05-01 DIAGNOSIS — R262 Difficulty in walking, not elsewhere classified: Secondary | ICD-10-CM | POA: Diagnosis present

## 2018-05-01 DIAGNOSIS — R2681 Unsteadiness on feet: Secondary | ICD-10-CM

## 2018-05-01 DIAGNOSIS — M6281 Muscle weakness (generalized): Secondary | ICD-10-CM

## 2018-05-01 NOTE — Therapy (Signed)
[place] MAIN [facility] SERVICES [address] [place], [place], [address] Phone: [phone]   Fax:  [phone]  Physical Therapy Treatment  Patient Details  Name: Yvonne Singleton MRN: [id number] Date of Birth: 15-Oct-1935 Referring Provider: Dr. Jennings Books   Encounter Date: 05/01/2018  PT End of Session - 05/01/18 1036    Visit Number  6    Number of Visits  13    Date for PT Re-Evaluation  05/01/18    Authorization Type  6/10 progress note    PT Start Time  1020    PT Stop Time  1100    PT Time Calculation (min)  40 min    Equipment Utilized During Treatment  Gait belt    Activity Tolerance  Patient tolerated treatment well;No increased pain    Behavior During Therapy  WFL for tasks assessed/performed       Past Medical History:  Diagnosis Date  . Back pain   . Breast cancer ([place]) 2007   right breast lumpectomy with rad tx  . Cancer [facility]) 2007   right breast  . Collagen vascular disease ([place])   . Gout   . Hard of hearing   . Hypertension   . Parkinson's disease ([place])   . Personal history of radiation therapy 2007   F/U right breast cancer    Past Surgical History:  Procedure Laterality Date  . ABDOMINAL HYSTERECTOMY    . BREAST LUMPECTOMY Right 2007   suspicious calcs, f/u with radiation  . CATARACT EXTRACTION Bilateral   . ESOPHAGOGASTRODUODENOSCOPY (EGD) WITH PROPOFOL N/A 05/25/2017   Procedure: ESOPHAGOGASTRODUODENOSCOPY (EGD) WITH PROPOFOL;  Surgeon: Lollie Sails, MD;  Location: [facility] ENDOSCOPY;  Service: Endoscopy;  Laterality: N/A;  . EYE SURGERY Bilateral   . HAND SURGERY    . HIP CLOSED REDUCTION Left 06/24/2015   Procedure: CLOSED REDUCTION HIP;  Surgeon: Dereck Leep, MD;  Location: [facility] ORS;  Service: Orthopedics;  Laterality: Left;  . HIP CLOSED REDUCTION Left 02/09/2016   Procedure: CLOSED MANIPULATION HIP;  Surgeon: Earnestine Leys, MD;  Location: [facility] ORS;  Service: Orthopedics;  Laterality: Left;   . HIP SURGERY    . JOINT REPLACEMENT     bilateral hip  . OOPHORECTOMY    . TOTAL KNEE ARTHROPLASTY Bilateral     There were no vitals filed for this visit.  Subjective Assessment - 05/01/18 1035    Subjective  Pt reports she is doing well, a nic equiet weekend, went to church. No recent balance/falls issues. HEP is going well but still has difficulty with standing hip ABDCT on the RLE.     Pertinent History  Pt arrived late to appointment, limiting evaluation. Pt presents with reports of imbalance and weakness.  Pt reports she stopped exercising this past winter so her balance has gotten worse.  Pt has been using Bil crutches at all times for 3-4 years.  Pt uses crutches instead of a walker due to chronic back and Bil wrist pain (R>L) due to gout and RA. Pt denies any falls in the past 6 months but has fallen in the past. Pt has had some issues with dizziness when taking Sinemet and Neurontin together so she has separated her schedule of taking these per Dr. Sharlet Salina. Pt reports she has trouble with mopping, vacuuming, sweeping, walking, outside work (flowers, etc).  Goals: to improve with all of these and to be able to do more in the kitchen.  Pt with h/o Bil TKA  and THAs, back surgery. Pt reports her R THA dislocated x3 and L THA dislocated x2 but last time this happened was 3 years ago.  Pt with h/o osteoporosis and RA.    Pt reports numbness and tingling and pain down LLE.  Pt additionally has numbness and tingling RLE but not as worse as on the L side.  These symptoms have been present for over a year and is being followed by Dr. Sharlet Salina for this.  Pt denies any bowel or bladder issues, saddle anesthesia, night sweats, sudden changes in weight.  Pt reports chronic pain in wrists, neck, back, Bil hip, BLEs.  Current pain in back: 4/10, best pain: 2/10, worst pain: 6/10.     Limitations  Walking;House hold activities;Standing    How long can you sit comfortably?  no issues    How long can you  stand comfortably?  10 minutes    How long can you walk comfortably?  15 minutes    Diagnostic tests  Pt had MRI lumbar spine 07/08/15: L5-S1 mild disc desiccation broad-based disc bulging extending to the R neural foramen, moderate to severe facet degenerative changes, moderate to severe R foraminal stenosis, L4-5 mild disc dessication broad-based disc bulging, moderate facet degenerative changes, moderate Bil lateral recess stenosis and mild to moderate central stenosis, mild Bil foraminal stenosis, at L3-4 there is severe degenerative disc disease, moderate to severe facet degenerative changes with mild central stenosis, mild Bil foraminal stenosis, at L2-3 there is severe degenerative disc disease, R hemilaminectomy defect, L1-2 mild disc dessication broad-based disc bulging. She has had periodic steroid injections with mild to moderate relief. Pt is followed at [facility] pain clinic for this. Plan is to schedule a L L5-S1 ESI if her pain worsens.    Patient Stated Goals  see above    Currently in Pain?  Yes    Pain Score  4     Pain Location  Back    Pain Orientation  Lower    Pain Descriptors / Indicators  Aching    Pain Type  Chronic pain    Pain Onset  More than a month ago    Pain Frequency  Constant    Aggravating Factors   standing     Effect of Pain on Daily Activities  needs to rest more       Therapeutic exercise and neuromuscular training:  1/2 foam flat side up and balance with head turns left and right feet apart and feet together,cues for better posture Side stepping on blue balance beam left and right x 10 lengths, cues for going slowly and she needs UE support with LOB backwards  tandem standing on 1/2 foam  , cues for better posture  standing hip abd with YTB x 20  , cues to keep her shoulders upright  side stepping left and right YTB in parallel bars 10 feet x 3 step ups from floor to 6 inch stool x 20 bilateral marching in parallel bars x 20, cues for bigger steps Tilt  board fwd/bwd, side to side left and right, needs UE support Stepping over bolster left and right and fwd/bwd, needs UE support with LOB backwards    CGA and Min to mod verbal cues used throughout with increased in postural sway and LOB most seen with narrow base of support and while on uneven surfaces. Continues to have balance deficits typical with diagnosis. Patient performs intermediate level exercises without pain behaviors and needs verbal cuing for postural alignment and  head positioning                     PT Education - 05/01/18 1036    Education Details  HEP    Person(s) Educated  Patient    Methods  Explanation    Comprehension  Verbalized understanding       PT Short Term Goals - 03/20/18 1129      PT SHORT TERM GOAL #1   Title  Pt will independently complete HEP at least 4 days/wk for improved carryover between sessions    Time  2    Period  Weeks    Status  New        PT Long Term Goals - 03/20/18 1130      PT LONG TERM GOAL #1   Title  Pt's ABC scale will improve to at least 55% to demonstrate improvement in pt's perceived balance    Baseline  35%    Time  5    Period  Weeks    Status  New      PT LONG TERM GOAL #2   Title  Pt's 5xSTS will improve to equal to or less than 15 seconds to demonstrates improved balance and strength    Baseline  26.47 seconds (pt pushing on thighs to stand)    Time  6    Period  Weeks    Status  New      PT LONG TERM GOAL #3   Title  Pt's Berg Balance Test will improve to at least 45/56 to demonstrate improved balance    Baseline  28/56    Time  6    Period  Weeks    Status  New      PT LONG TERM GOAL #4   Title  Pt will improve 24mWT time to at least 0.80 m/s for improved safety with ambulation in the community    Baseline  0.52 m/s using crutches    Time  5    Period  Weeks    Status  New      PT LONG TERM GOAL #5   Title  Pt's TUG will improve to equal to or less than 13 seconds to demonstrate  improved balance, gait speed, and strength    Baseline  22.47 seconds    Time  5    Period  Weeks    Status  New            Plan - 05/01/18 1037    Clinical Impression Statement  Dynamic and static balance interventions continued today, with a focus on unilateral LE stability.  Pt tolerated all exercises well.  Intermediate dynamic standing balance tasks were progressed with cga needed for reaching outside of her base of support.  Pt would continue to benefit from skilled therapy services in order to address balance deficits in order to decrease fall risk and improve mobility.    Rehab Potential  Good    PT Frequency  2x / week    PT Duration  6 weeks    PT Treatment/Interventions  ADLs/Self Care Home Management;Aquatic Therapy;Biofeedback;Cryotherapy;Electrical Stimulation;Iontophoresis 4mg /ml Dexamethasone;Moist Heat;Traction;Ultrasound;DME Instruction;Gait training;Stair training;Functional mobility training;Therapeutic activities;Therapeutic exercise;Neuromuscular re-education;Balance training;Patient/family education;Manual techniques;Orthotic Fit/Training;Dry needling;Passive range of motion;Compression bandaging;Energy conservation;Taping    PT Next Visit Plan  progress strengthening program and introduce balance interventions    PT Home Exercise Plan  seated marching, sit<>stand, standing hip Abd, standing hip E    Consulted and Agree with Plan of Care  Patient       Patient will benefit from skilled therapeutic intervention in order to improve the following deficits and impairments:  Abnormal gait, Decreased activity tolerance, Decreased balance, Decreased coordination, Decreased endurance, Decreased knowledge of use of DME, Decreased range of motion, Decreased mobility, Decreased safety awareness, Decreased strength, Difficulty walking, Hypomobility, Increased edema, Increased fascial restricitons, Increased muscle spasms, Impaired perceived functional ability, Impaired  flexibility, Impaired sensation, Improper body mechanics, Postural dysfunction, Pain  Visit Diagnosis: Unsteadiness on feet  Muscle weakness (generalized)  Other lack of coordination  Difficulty in walking, not elsewhere classified     Problem List Patient Active Problem List   Diagnosis Date Noted  . History of bilateral hip replacements 05/28/2015  . Spinal stenosis, lumbar region, with neurogenic claudication 05/28/2015  . DDD (degenerative disc disease), lumbar 03/13/2015  . Lumbar post-laminectomy syndrome 03/13/2015  . Degenerative cervical disc 03/13/2015  . Status post bilateral total hip replacement 03/13/2015  . Sacroiliac joint dysfunction 03/13/2015  . History of surgery on upper extremity 03/13/2015  . DJD of shoulder 03/13/2015  . Rheumatoid arthritis ([place]) 02/17/2015    Alanson Puls, [place] DPT 05/01/2018, 10:45 AM  [place] MAIN [facility] SERVICES [address] [place], [place], [address] Phone: [phone]   Fax:  [phone]  Name: Kenlynn Houde MRN: [id number] Date of Birth: January 16, 1935

## 2018-05-03 ENCOUNTER — Ambulatory Visit: Payer: Medicare Other | Admitting: Physical Therapy

## 2018-05-03 ENCOUNTER — Encounter: Payer: Self-pay | Admitting: Physical Therapy

## 2018-05-03 DIAGNOSIS — M6281 Muscle weakness (generalized): Secondary | ICD-10-CM

## 2018-05-03 DIAGNOSIS — R2681 Unsteadiness on feet: Secondary | ICD-10-CM

## 2018-05-03 DIAGNOSIS — R262 Difficulty in walking, not elsewhere classified: Secondary | ICD-10-CM

## 2018-05-03 DIAGNOSIS — R278 Other lack of coordination: Secondary | ICD-10-CM

## 2018-05-03 NOTE — Therapy (Signed)
[place] MAIN [facility] SERVICES [address] [place], [place], [address] Phone: [phone]   Fax:  [phone]  Physical Therapy Treatment  Patient Details  Name: Yvonne Singleton MRN: [id number] Date of Birth: [birth year] Referring Provider: Dr. Jennings Books   Encounter Date: 05/03/2018  PT End of Session - 05/03/18 1025    Visit Number  7    Number of Visits  13    Date for PT Re-Evaluation  05/01/18    Authorization Type  7/10 progress note    PT Start Time  1017    PT Stop Time  1100    PT Time Calculation (min)  43 min    Equipment Utilized During Treatment  Gait belt    Activity Tolerance  Patient tolerated treatment well;No increased pain    Behavior During Therapy  WFL for tasks assessed/performed       Past Medical History:  Diagnosis Date  . Back pain   . Breast cancer ([place]) 2007   right breast lumpectomy with rad tx  . Cancer [facility]) 2007   right breast  . Collagen vascular disease ([place])   . Gout   . Hard of hearing   . Hypertension   . Parkinson's disease ([place])   . Personal history of radiation therapy 2007   F/U right breast cancer    Past Surgical History:  Procedure Laterality Date  . ABDOMINAL HYSTERECTOMY    . BREAST LUMPECTOMY Right 2007   suspicious calcs, f/u with radiation  . CATARACT EXTRACTION Bilateral   . ESOPHAGOGASTRODUODENOSCOPY (EGD) WITH PROPOFOL N/A 05/25/2017   Procedure: ESOPHAGOGASTRODUODENOSCOPY (EGD) WITH PROPOFOL;  Surgeon: Lollie Sails, MD;  Location: [facility] ENDOSCOPY;  Service: Endoscopy;  Laterality: N/A;  . EYE SURGERY Bilateral   . HAND SURGERY    . HIP CLOSED REDUCTION Left 06/24/2015   Procedure: CLOSED REDUCTION HIP;  Surgeon: Dereck Leep, MD;  Location: [facility] ORS;  Service: Orthopedics;  Laterality: Left;  . HIP CLOSED REDUCTION Left 02/09/2016   Procedure: CLOSED MANIPULATION HIP;  Surgeon: Earnestine Leys, MD;  Location: [facility] ORS;  Service: Orthopedics;  Laterality: Left;   . HIP SURGERY    . JOINT REPLACEMENT     bilateral hip  . OOPHORECTOMY    . TOTAL KNEE ARTHROPLASTY Bilateral     There were no vitals filed for this visit.  Subjective Assessment - 05/03/18 1025    Subjective  Pt reports she is doing well, a nic equiet weekend, went to church. No recent balance/falls issues. HEP is going well but still has difficulty with standing hip ABDCT on the RLE.     Pertinent History  Pt arrived late to appointment, limiting evaluation. Pt presents with reports of imbalance and weakness.  Pt reports she stopped exercising this past winter so her balance has gotten worse.  Pt has been using Bil crutches at all times for 3-4 years.  Pt uses crutches instead of a walker due to chronic back and Bil wrist pain (R>L) due to gout and RA. Pt denies any falls in the past 6 months but has fallen in the past. Pt has had some issues with dizziness when taking Sinemet and Neurontin together so she has separated her schedule of taking these per Dr. Sharlet Salina. Pt reports she has trouble with mopping, vacuuming, sweeping, walking, outside work (flowers, etc).  Goals: to improve with all of these and to be able to do more in the kitchen.  Pt with h/o Bil TKA  and THAs, back surgery. Pt reports her R THA dislocated x3 and L THA dislocated x2 but last time this happened was 3 years ago.  Pt with h/o osteoporosis and RA.    Pt reports numbness and tingling and pain down LLE.  Pt additionally has numbness and tingling RLE but not as worse as on the L side.  These symptoms have been present for over a year and is being followed by Dr. Sharlet Salina for this.  Pt denies any bowel or bladder issues, saddle anesthesia, night sweats, sudden changes in weight.  Pt reports chronic pain in wrists, neck, back, Bil hip, BLEs.  Current pain in back: 4/10, best pain: 2/10, worst pain: 6/10.     Limitations  Walking;House hold activities;Standing    How long can you sit comfortably?  no issues    How long can you  stand comfortably?  10 minutes    How long can you walk comfortably?  15 minutes    Diagnostic tests  Pt had MRI lumbar spine 07/08/15: L5-S1 mild disc desiccation broad-based disc bulging extending to the R neural foramen, moderate to severe facet degenerative changes, moderate to severe R foraminal stenosis, L4-5 mild disc dessication broad-based disc bulging, moderate facet degenerative changes, moderate Bil lateral recess stenosis and mild to moderate central stenosis, mild Bil foraminal stenosis, at L3-4 there is severe degenerative disc disease, moderate to severe facet degenerative changes with mild central stenosis, mild Bil foraminal stenosis, at L2-3 there is severe degenerative disc disease, R hemilaminectomy defect, L1-2 mild disc dessication broad-based disc bulging. She has had periodic steroid injections with mild to moderate relief. Pt is followed at [facility] pain clinic for this. Plan is to schedule a L L5-S1 ESI if her pain worsens.    Patient Stated Goals  see above    Currently in Pain?  No/denies    Pain Score  0-No pain    Pain Onset  More than a month ago         Therapeutic exercise and neuromuscular training: Standing on blue foam and head turns x 2 mins feet apart, with UE support Standing on blue foam and head turns x 2 mins together apart, with UE support Stepping over hurdle left and right and fwd/bwd, needs UE support with LOB backwards 1/2 foam flat side up and balance with head turns left and right feet apart and feet together,cues for better posture Side stepping on blue balance beam left and right x 10 lengths, cues for going slowly and she needs UE support with LOB backwards tandem standing floor with head turns and minimal UE assist x 3 mins step ups from floor to 6 inch stool x 20 bilateral Tilt board fwd/bwd, side to side left and right, needs UE support Leg press with 90 lbs x 20 x 3, heel raises x 90 lbs      CGA and Min to mod verbal cues used throughout  with increased in postural sway and LOB most seen with narrow base of support and while on uneven surfaces. Patient needs Ue support during all balance activities and has back pain and fatigue and needs a seated rest period.                        PT Education - 05/03/18 1025    Education Details  HEP    Person(s) Educated  Patient    Methods  Explanation;Verbal cues    Comprehension  Verbalized understanding;Returned demonstration;Verbal  cues required       PT Short Term Goals - 03/20/18 1129      PT SHORT TERM GOAL #1   Title  Pt will independently complete HEP at least 4 days/wk for improved carryover between sessions    Time  2    Period  Weeks    Status  New        PT Long Term Goals - 03/20/18 1130      PT LONG TERM GOAL #1   Title  Pt's ABC scale will improve to at least 55% to demonstrate improvement in pt's perceived balance    Baseline  35%    Time  5    Period  Weeks    Status  New      PT LONG TERM GOAL #2   Title  Pt's 5xSTS will improve to equal to or less than 15 seconds to demonstrates improved balance and strength    Baseline  26.47 seconds (pt pushing on thighs to stand)    Time  6    Period  Weeks    Status  New      PT LONG TERM GOAL #3   Title  Pt's Berg Balance Test will improve to at least 45/56 to demonstrate improved balance    Baseline  28/56    Time  6    Period  Weeks    Status  New      PT LONG TERM GOAL #4   Title  Pt will improve 73mWT time to at least 0.80 m/s for improved safety with ambulation in the community    Baseline  0.52 m/s using crutches    Time  5    Period  Weeks    Status  New      PT LONG TERM GOAL #5   Title  Pt's TUG will improve to equal to or less than 13 seconds to demonstrate improved balance, gait speed, and strength    Baseline  22.47 seconds    Time  5    Period  Weeks    Status  New            Plan - 05/03/18 1026    Clinical Impression Statement  Patient performs  strengthening and static and dynamic balance exercises. She has back pain and fatigue and needs occassional seated rest perioeds.  Pt reported back pain with stepping activity which decreased with cues to activate core. Encouraged pt to activate core when she experiences back pain with daily activities. Pt demonstrated fatigue with seated strengthening exercises. Pt will benefit from continued skilled PT interventions for improved strength and balance for decreased fall risk and improved QOL.     Rehab Potential  Good    PT Frequency  2x / week    PT Duration  6 weeks    PT Treatment/Interventions  ADLs/Self Care Home Management;Aquatic Therapy;Biofeedback;Cryotherapy;Electrical Stimulation;Iontophoresis 4mg /ml Dexamethasone;Moist Heat;Traction;Ultrasound;DME Instruction;Gait training;Stair training;Functional mobility training;Therapeutic activities;Therapeutic exercise;Neuromuscular re-education;Balance training;Patient/family education;Manual techniques;Orthotic Fit/Training;Dry needling;Passive range of motion;Compression bandaging;Energy conservation;Taping    PT Next Visit Plan  progress strengthening program and introduce balance interventions    PT Home Exercise Plan  seated marching, sit<>stand, standing hip Abd, standing hip E    Consulted and Agree with Plan of Care  Patient       Patient will benefit from skilled therapeutic intervention in order to improve the following deficits and impairments:  Abnormal gait, Decreased activity tolerance, Decreased balance, Decreased coordination, Decreased endurance, Decreased knowledge of  use of DME, Decreased range of motion, Decreased mobility, Decreased safety awareness, Decreased strength, Difficulty walking, Hypomobility, Increased edema, Increased fascial restricitons, Increased muscle spasms, Impaired perceived functional ability, Impaired flexibility, Impaired sensation, Improper body mechanics, Postural dysfunction, Pain  Visit  Diagnosis: Unsteadiness on feet  Muscle weakness (generalized)  Other lack of coordination  Difficulty in walking, not elsewhere classified     Problem List Patient Active Problem List   Diagnosis Date Noted  . History of bilateral hip replacements 05/28/2015  . Spinal stenosis, lumbar region, with neurogenic claudication 05/28/2015  . DDD (degenerative disc disease), lumbar 03/13/2015  . Lumbar post-laminectomy syndrome 03/13/2015  . Degenerative cervical disc 03/13/2015  . Status post bilateral total hip replacement 03/13/2015  . Sacroiliac joint dysfunction 03/13/2015  . History of surgery on upper extremity 03/13/2015  . DJD of shoulder 03/13/2015  . Rheumatoid arthritis ([place]) 02/17/2015    Alanson Puls, [place] DPT 05/03/2018, 10:46 AM  [place] MAIN [facility] SERVICES [address] [place], [place], [address] Phone: [phone]   Fax:  [phone]  Name: Yvonne Singleton MRN: [id number] Date of Birth: Dec 21, 1934

## 2018-05-08 ENCOUNTER — Ambulatory Visit: Payer: Medicare Other | Admitting: Physical Therapy

## 2018-05-08 ENCOUNTER — Encounter: Payer: Self-pay | Admitting: Physical Therapy

## 2018-05-08 DIAGNOSIS — R278 Other lack of coordination: Secondary | ICD-10-CM

## 2018-05-08 DIAGNOSIS — R2681 Unsteadiness on feet: Secondary | ICD-10-CM | POA: Diagnosis not present

## 2018-05-08 DIAGNOSIS — M6281 Muscle weakness (generalized): Secondary | ICD-10-CM

## 2018-05-08 DIAGNOSIS — R262 Difficulty in walking, not elsewhere classified: Secondary | ICD-10-CM

## 2018-05-08 NOTE — Therapy (Signed)
[place] MAIN [facility] SERVICES [place], [place], [address] Phone: [phone]   Fax:  [phone]  Physical Therapy Treatment  Physical Therapy Progress Note   Dates of reporting period  03/20/18   to   05/08/18  Patient Details  Name: Yvonne Singleton MRN: [id number] Date of Birth: 03-29-1935 Referring Provider: Dr. Jennings Books   Encounter Date: 05/08/2018  PT End of Session - 05/08/18 0908    Visit Number  8    Number of Visits  13    Date for PT Re-Evaluation  06/26/18    Authorization Type  8/10 progress note    Equipment Utilized During Treatment  Gait belt    Activity Tolerance  Patient tolerated treatment well;No increased pain    Behavior During Therapy  WFL for tasks assessed/performed       Past Medical History:  Diagnosis Date  . Back pain   . Breast cancer ([place]) 2007   right breast lumpectomy with rad tx  . Cancer [facility]) 2007   right breast  . Collagen vascular disease ([place])   . Gout   . Hard of hearing   . Hypertension   . Parkinson's disease ([place])   . Personal history of radiation therapy 2007   F/U right breast cancer    Past Surgical History:  Procedure Laterality Date  . ABDOMINAL HYSTERECTOMY    . BREAST LUMPECTOMY Right 2007   suspicious calcs, f/u with radiation  . CATARACT EXTRACTION Bilateral   . ESOPHAGOGASTRODUODENOSCOPY (EGD) WITH PROPOFOL N/A 05/25/2017   Procedure: ESOPHAGOGASTRODUODENOSCOPY (EGD) WITH PROPOFOL;  Surgeon: Lollie Sails, MD;  Location: [facility] ENDOSCOPY;  Service: Endoscopy;  Laterality: N/A;  . EYE SURGERY Bilateral   . HAND SURGERY    . HIP CLOSED REDUCTION Left 06/24/2015   Procedure: CLOSED REDUCTION HIP;  Surgeon: Dereck Leep, MD;  Location: [facility] ORS;  Service: Orthopedics;  Laterality: Left;  . HIP CLOSED REDUCTION Left 02/09/2016   Procedure: CLOSED MANIPULATION HIP;  Surgeon: Earnestine Leys, MD;  Location: [facility] ORS;  Service: Orthopedics;  Laterality: Left;   . HIP SURGERY    . JOINT REPLACEMENT     bilateral hip  . OOPHORECTOMY    . TOTAL KNEE ARTHROPLASTY Bilateral     There were no vitals filed for this visit.  Subjective Assessment - 05/08/18 0901    Subjective  Pt reports she is doing well, a nic equiet weekend, went to church. No recent balance/falls issues. HEP is going well but still has difficulty with standing hip ABDCT on the RLE.     Pertinent History  Pt arrived late to appointment, limiting evaluation. Pt presents with reports of imbalance and weakness.  Pt reports she stopped exercising this past winter so her balance has gotten worse.  Pt has been using Bil crutches at all times for 3-4 years.  Pt uses crutches instead of a walker due to chronic back and Bil wrist pain (R>L) due to gout and RA. Pt denies any falls in the past 6 months but has fallen in the past. Pt has had some issues with dizziness when taking Sinemet and Neurontin together so she has separated her schedule of taking these per Dr. Sharlet Salina. Pt reports she has trouble with mopping, vacuuming, sweeping, walking, outside work (flowers, etc).  Goals: to improve with all of these and to be able to do more in the kitchen.  Pt with h/o Bil TKA and THAs, back surgery. Pt reports her  R THA dislocated x3 and L THA dislocated x2 but last time this happened was 3 years ago.  Pt with h/o osteoporosis and RA.    Pt reports numbness and tingling and pain down LLE.  Pt additionally has numbness and tingling RLE but not as worse as on the L side.  These symptoms have been present for over a year and is being followed by Dr. Sharlet Salina for this.  Pt denies any bowel or bladder issues, saddle anesthesia, night sweats, sudden changes in weight.  Pt reports chronic pain in wrists, neck, back, Bil hip, BLEs.  Current pain in back: 4/10, best pain: 2/10, worst pain: 6/10.     Limitations  Walking;House hold activities;Standing    How long can you sit comfortably?  no issues    How long can you  stand comfortably?  10 minutes    How long can you walk comfortably?  15 minutes    Diagnostic tests  Pt had MRI lumbar spine 07/08/15: L5-S1 mild disc desiccation broad-based disc bulging extending to the R neural foramen, moderate to severe facet degenerative changes, moderate to severe R foraminal stenosis, L4-5 mild disc dessication broad-based disc bulging, moderate facet degenerative changes, moderate Bil lateral recess stenosis and mild to moderate central stenosis, mild Bil foraminal stenosis, at L3-4 there is severe degenerative disc disease, moderate to severe facet degenerative changes with mild central stenosis, mild Bil foraminal stenosis, at L2-3 there is severe degenerative disc disease, R hemilaminectomy defect, L1-2 mild disc dessication broad-based disc bulging. She has had periodic steroid injections with mild to moderate relief. Pt is followed at [facility] pain clinic for this. Plan is to schedule a L L5-S1 ESI if her pain worsens.    Patient Stated Goals  see above    Currently in Pain?  Yes    Pain Score  4     Pain Location  Back    Pain Orientation  Lower    Pain Descriptors / Indicators  Aching    Pain Onset  More than a month ago       Therapeutic activity: Outcome measures performed with improved TUG, 5 x sit to stand, 10 MW   Therapeutic exercise: LAQ with 2 # BLE x 15 x 2 with 3 sec hold Marching with 2 # x 15 x 2 BLE  Hip extension standing with knee ext x 15 BLE Hip abd sidelying left and right x 15 BLE hooklying abd/ER x 15 x 2 with RTB Hooklying marching with 2 lbs x 15 x 2 Bridges x 10 Standing hip abd x 15 x 2  BLE   Patient reports pain decreased to 2/10 in sitting and supine                        PT Education - 05/08/18 0903    Education Details  HEP    Person(s) Educated  Patient    Methods  Explanation    Comprehension  Verbalized understanding;Returned demonstration       PT Short Term Goals - 03/20/18 1129      PT SHORT  TERM GOAL #1   Title  Pt will independently complete HEP at least 4 days/wk for improved carryover between sessions    Time  2    Period  Weeks    Status  New        PT Long Term Goals - 05/08/18 6962      PT LONG TERM GOAL #1  Title  Pt's ABC scale will improve to at least 55% to demonstrate improvement in pt's perceived balance    Baseline  35%    Time  8    Period  Weeks    Status  Partially Met    Target Date  06/26/18      PT LONG TERM GOAL #2   Title  Pt's 5xSTS will improve to equal to or less than 15 seconds to demonstrates improved balance and strength    Baseline  26.47 seconds (pt pushing on thighs to stand)  05/08/18  23.20 sec    Time  8    Period  Weeks    Status  Partially Met    Target Date  06/26/18      PT LONG TERM GOAL #3   Title  Pt's Berg Balance Test will improve to at least 45/56 to demonstrate improved balance    Baseline  28/56:  05/08/18    Time  8    Period  Weeks    Status  Partially Met    Target Date  06/26/18      PT LONG TERM GOAL #4   Title  Pt will improve 11mT time to at least 0.80 m/s for improved safety with ambulation in the community    Baseline  0.52 m/s using crutches: .68 m/sec    Time  8    Period  Weeks    Status  Partially Met    Target Date  06/26/18      PT LONG TERM GOAL #5   Title  Pt's TUG will improve to equal to or less than 13 seconds to demonstrate improved balance, gait speed, and strength    Baseline  22.47 seconds  05/08/18  20.30    Time  8    Period  Weeks    Status  Partially Met    Target Date  06/26/18            Plan - 05/08/18 0909    Clinical Impression Statement Patient's condition has the potential to improve in response to therapy. Maximum improvement is yet to be obtained. The anticipated improvement is attainable and reasonable in a generally predictable time. Start date of reporting period  03/20/18 end date of reporting period 05/08/18  Patient reports having back pain but able to walk a  bit better.  Patient demonstrated ability to activate core resulting in decreased strain on low back allowing for improved capacity. Patient challenged by step length increase initially however improved with repetition. Pt will benefit from continued skilled PT interventions for improved strength and balance for decreased fall risk and improved QOL    Rehab Potential  Good    PT Frequency  2x / week    PT Duration  8 weeks    PT Treatment/Interventions  ADLs/Self Care Home Management;Aquatic Therapy;Biofeedback;Cryotherapy;Electrical Stimulation;Iontophoresis 439mml Dexamethasone;Moist Heat;Traction;Ultrasound;DME Instruction;Gait training;Stair training;Functional mobility training;Therapeutic activities;Therapeutic exercise;Neuromuscular re-education;Balance training;Patient/family education;Manual techniques;Orthotic Fit/Training;Dry needling;Passive range of motion;Compression bandaging;Energy conservation;Taping    PT Next Visit Plan  progress strengthening program and introduce balance interventions    PT Home Exercise Plan  seated marching, sit<>stand, standing hip Abd, standing hip E    Consulted and Agree with Plan of Care  Patient       Patient will benefit from skilled therapeutic intervention in order to improve the following deficits and impairments:  Abnormal gait, Decreased activity tolerance, Decreased balance, Decreased coordination, Decreased endurance, Decreased knowledge of use of DME, Decreased range of motion,  Decreased mobility, Decreased safety awareness, Decreased strength, Difficulty walking, Hypomobility, Increased edema, Increased fascial restricitons, Increased muscle spasms, Impaired perceived functional ability, Impaired flexibility, Impaired sensation, Improper body mechanics, Postural dysfunction, Pain  Visit Diagnosis: Unsteadiness on feet - Plan: PT plan of care cert/re-cert  Muscle weakness (generalized) - Plan: PT plan of care cert/re-cert  Other lack of  coordination - Plan: PT plan of care cert/re-cert  Difficulty in walking, not elsewhere classified - Plan: PT plan of care cert/re-cert     Problem List Patient Active Problem List   Diagnosis Date Noted  . History of bilateral hip replacements 05/28/2015  . Spinal stenosis, lumbar region, with neurogenic claudication 05/28/2015  . DDD (degenerative disc disease), lumbar 03/13/2015  . Lumbar post-laminectomy syndrome 03/13/2015  . Degenerative cervical disc 03/13/2015  . Status post bilateral total hip replacement 03/13/2015  . Sacroiliac joint dysfunction 03/13/2015  . History of surgery on upper extremity 03/13/2015  . DJD of shoulder 03/13/2015  . Rheumatoid arthritis ([place]) 02/17/2015    Alanson Puls, [place] DPT 05/08/2018, 9:35 AM  [place] MAIN [facility] SERVICES [address] [place], [place], [address] Phone: [phone]   Fax:  [phone]  Name: Yvonne Singleton MRN: [id number] Date of Birth: May 12, 1935

## 2018-05-09 ENCOUNTER — Emergency Department: Payer: Medicare Other | Admitting: Certified Registered"

## 2018-05-09 ENCOUNTER — Encounter: Payer: Self-pay | Admitting: Emergency Medicine

## 2018-05-09 ENCOUNTER — Encounter
Admission: EM | Disposition: A | Discharge status: Home / Self Care | Payer: Self-pay | Source: Home / Self Care | Attending: Emergency Medicine

## 2018-05-09 ENCOUNTER — Ambulatory Visit: Admit: 2018-05-09 | Payer: Medicare Other | Admitting: Gastroenterology

## 2018-05-09 ENCOUNTER — Other Ambulatory Visit: Payer: Self-pay

## 2018-05-09 ENCOUNTER — Emergency Department
Admission: EM | Admit: 2018-05-09 | Discharge: 2018-05-09 | Disposition: A | Discharge status: Home / Self Care | Payer: Medicare Other | Attending: Emergency Medicine | Admitting: Emergency Medicine

## 2018-05-09 DIAGNOSIS — M359 Systemic involvement of connective tissue, unspecified: Secondary | ICD-10-CM | POA: Insufficient documentation

## 2018-05-09 DIAGNOSIS — G20 Parkinson's disease: Secondary | ICD-10-CM | POA: Insufficient documentation

## 2018-05-09 DIAGNOSIS — M109 Gout, unspecified: Secondary | ICD-10-CM | POA: Diagnosis not present

## 2018-05-09 DIAGNOSIS — Z853 Personal history of malignant neoplasm of breast: Secondary | ICD-10-CM | POA: Diagnosis not present

## 2018-05-09 DIAGNOSIS — I10 Essential (primary) hypertension: Secondary | ICD-10-CM | POA: Insufficient documentation

## 2018-05-09 DIAGNOSIS — Z79899 Other long term (current) drug therapy: Secondary | ICD-10-CM | POA: Insufficient documentation

## 2018-05-09 DIAGNOSIS — X58XXXA Exposure to other specified factors, initial encounter: Secondary | ICD-10-CM | POA: Diagnosis not present

## 2018-05-09 DIAGNOSIS — K222 Esophageal obstruction: Secondary | ICD-10-CM | POA: Insufficient documentation

## 2018-05-09 DIAGNOSIS — M549 Dorsalgia, unspecified: Secondary | ICD-10-CM | POA: Insufficient documentation

## 2018-05-09 DIAGNOSIS — Z79891 Long term (current) use of opiate analgesic: Secondary | ICD-10-CM | POA: Diagnosis not present

## 2018-05-09 DIAGNOSIS — K319 Disease of stomach and duodenum, unspecified: Secondary | ICD-10-CM | POA: Insufficient documentation

## 2018-05-09 DIAGNOSIS — T18128A Food in esophagus causing other injury, initial encounter: Secondary | ICD-10-CM | POA: Insufficient documentation

## 2018-05-09 DIAGNOSIS — K449 Diaphragmatic hernia without obstruction or gangrene: Secondary | ICD-10-CM | POA: Diagnosis not present

## 2018-05-09 DIAGNOSIS — Z923 Personal history of irradiation: Secondary | ICD-10-CM | POA: Insufficient documentation

## 2018-05-09 HISTORY — PX: ESOPHAGOGASTRODUODENOSCOPY (EGD) WITH PROPOFOL: SHX5813

## 2018-05-09 SURGERY — ESOPHAGOGASTRODUODENOSCOPY (EGD) WITH PROPOFOL
Anesthesia: General

## 2018-05-09 MED ORDER — FENTANYL CITRATE (PF) 100 MCG/2ML IJ SOLN: 25.0000 ug | INTRAMUSCULAR | Status: DC | PRN

## 2018-05-09 MED ORDER — SUCCINYLCHOLINE CHLORIDE 20 MG/ML IJ SOLN
INTRAMUSCULAR | Status: DC | PRN
  Administered 2018-05-09: 100 mg via INTRAVENOUS

## 2018-05-09 MED ORDER — ONDANSETRON HCL 4 MG/2ML IJ SOLN
INTRAMUSCULAR | Status: DC | PRN
  Administered 2018-05-09: 4 mg via INTRAVENOUS

## 2018-05-09 MED ORDER — ONDANSETRON HCL 4 MG/2ML IJ SOLN: 4.0000 mg | Freq: Once | INTRAMUSCULAR | Status: DC | PRN

## 2018-05-09 MED ORDER — PROPOFOL 10 MG/ML IV BOLUS
INTRAVENOUS | Status: DC | PRN
  Administered 2018-05-09: 110 mg via INTRAVENOUS

## 2018-05-09 MED ORDER — SODIUM CHLORIDE 0.9 % IV SOLN: INTRAVENOUS | Status: DC

## 2018-05-09 MED ORDER — SUCCINYLCHOLINE CHLORIDE 20 MG/ML IJ SOLN
INTRAMUSCULAR | Status: AC
  Filled 2018-05-09: qty 1

## 2018-05-09 MED ORDER — LACTATED RINGERS IV SOLN
Freq: Once | INTRAVENOUS | Status: AC
  Administered 2018-05-09: 1000 mL via INTRAVENOUS

## 2018-05-09 MED ORDER — OMEPRAZOLE 40 MG PO CPDR: 40.0000 mg | DELAYED_RELEASE_CAPSULE | Freq: Every day | ORAL | 0 refills | Status: DC

## 2018-05-09 MED ORDER — PROPOFOL 10 MG/ML IV BOLUS
INTRAVENOUS | Status: AC
  Filled 2018-05-09: qty 20

## 2018-05-09 MED ORDER — ROCURONIUM BROMIDE 50 MG/5ML IV SOLN
INTRAVENOUS | Status: AC
  Filled 2018-05-09: qty 1

## 2018-05-09 MED ORDER — LACTATED RINGERS IV SOLN
INTRAVENOUS | Status: DC | PRN
  Administered 2018-05-09: 18:00:00 via INTRAVENOUS

## 2018-05-09 NOTE — ED Provider Notes (Signed)
[facility] Emergency Department Provider Note  ____________________________________________  Time seen: Approximately 5:07 PM  I have reviewed the triage vital signs and the nursing notes.   HISTORY  Chief Complaint Foreign body in throat   HPI Yvonne Singleton is a 83 y.o. female with a history of GERD, hiatal hernia, tortuous esophagus with abnormal motility who presents for evaluation of food bolus impaction. Patient reports that she was eating steak at 4PM when a piece got stuck in her esophagus. She denies any difficulty breathing and choking episode. She has been unable to swallow saliva or any liquids since then. Patient feels that the piece is stuck in her upper esophageal region. She denies any prior history of same.   Past Medical History:  Diagnosis Date  . Back pain   . Breast cancer ([place]) 2007   right breast lumpectomy with rad tx  . Cancer [facility]) 2007   right breast  . Collagen vascular disease ([place])   . Gout   . Hard of hearing   . Hypertension   . Parkinson's disease ([place])   . Personal history of radiation therapy 2007   F/U right breast cancer    Patient Active Problem List   Diagnosis Date Noted  . History of bilateral hip replacements 05/28/2015  . Spinal stenosis, lumbar region, with neurogenic claudication 05/28/2015  . DDD (degenerative disc disease), lumbar 03/13/2015  . Lumbar post-laminectomy syndrome 03/13/2015  . Degenerative cervical disc 03/13/2015  . Status post bilateral total hip replacement 03/13/2015  . Sacroiliac joint dysfunction 03/13/2015  . History of surgery on upper extremity 03/13/2015  . DJD of shoulder 03/13/2015  . Rheumatoid arthritis ([place]) 02/17/2015    Past Surgical History:  Procedure Laterality Date  . ABDOMINAL HYSTERECTOMY    . BREAST LUMPECTOMY Right 2007   suspicious calcs, f/u with radiation  . CATARACT EXTRACTION Bilateral   . ESOPHAGOGASTRODUODENOSCOPY (EGD) WITH PROPOFOL  N/A 05/25/2017   Procedure: ESOPHAGOGASTRODUODENOSCOPY (EGD) WITH PROPOFOL;  Surgeon: Lollie Sails, MD;  Location: [facility] ENDOSCOPY;  Service: Endoscopy;  Laterality: N/A;  . EYE SURGERY Bilateral   . HAND SURGERY    . HIP CLOSED REDUCTION Left 06/24/2015   Procedure: CLOSED REDUCTION HIP;  Surgeon: Dereck Leep, MD;  Location: [facility] ORS;  Service: Orthopedics;  Laterality: Left;  . HIP CLOSED REDUCTION Left 02/09/2016   Procedure: CLOSED MANIPULATION HIP;  Surgeon: Earnestine Leys, MD;  Location: [facility] ORS;  Service: Orthopedics;  Laterality: Left;  . HIP SURGERY    . JOINT REPLACEMENT     bilateral hip  . OOPHORECTOMY    . TOTAL KNEE ARTHROPLASTY Bilateral     Prior to Admission medications   Medication Sig Start Date End Date Taking? Authorizing Provider  b complex vitamins tablet Take 1 tablet by mouth daily.   Yes [provider]  Calcium Carbonate-Vitamin D (CALCIUM 600+D) 600-200 MG-UNIT TABS Take 1 tablet by mouth daily.   Yes [provider]  carbidopa-levodopa (SINEMET CR) 50-200 MG per tablet Take 1 tablet by mouth at bedtime.    Yes [provider]  cholecalciferol (VITAMIN D) 1000 units tablet Take 2,000 Units by mouth daily.   Yes [provider]  DULoxetine (CYMBALTA) 30 MG capsule Take 30 mg by mouth daily.   Yes [provider]  furosemide (LASIX) 20 MG tablet Take 20 mg by mouth daily.    Yes [provider]  losartan (COZAAR) 100 MG tablet Take 100 mg by mouth daily.  Yes [provider]  allopurinol (ZYLOPRIM) 100 MG tablet Take 200 mg by mouth at bedtime.     [provider]  gabapentin (NEURONTIN) 100 MG capsule Limit 1 capsule by mouth per day if tolerated 06/17/16   Mohammed Kindle, MD  gabapentin (NEURONTIN) 100 MG capsule Limit 1 capsule by mouth per day or every other day if tolerated 06/17/16   Mohammed Kindle, MD  hydroxychloroquine (PLAQUENIL) 200 MG tablet Take 400 mg by mouth daily.      [provider]  omeprazole (PRILOSEC) 40 MG capsule Take 1 capsule (40 mg total) by mouth daily before breakfast. 05/09/18 08/07/18  Lin Landsman, MD  oxyCODONE (OXY IR/ROXICODONE) 5 MG immediate release tablet Limit 1/2 - 1 tablet by mouth per day or 2  times per day if tolerated 06/17/16   Mohammed Kindle, MD  tolterodine (DETROL LA) 4 MG 24 hr capsule Take 4 mg by mouth daily.    [provider]    Allergies Trospium; Iodinated diagnostic agents; and Streptomycin  Family History  Problem Relation Age of Onset  . Heart disease Mother   . Arthritis Mother   . Heart disease Father   . Ulcers Father   . Breast cancer Maternal Aunt     Social History Social History   Tobacco Use  . Smoking status: Never Smoker  . Smokeless tobacco: Never Used  Substance Use Topics  . Alcohol use: No    Alcohol/week: 0.0 oz  . Drug use: No    Review of Systems  Constitutional: Negative for fever. Eyes: Negative for visual changes. ENT: Negative for sore throat. Neck: No neck pain  Cardiovascular: Negative for chest pain. Respiratory: Negative for shortness of breath. Gastrointestinal: Negative for abdominal pain, vomiting or diarrhea. + esophageal food impaction Genitourinary: Negative for dysuria. Musculoskeletal: Negative for back pain. Skin: Negative for rash. Neurological: Negative for headaches, weakness or numbness. Psych: No SI or HI  ____________________________________________   PHYSICAL EXAM:  VITAL SIGNS: ED Triage Vitals  Enc Vitals Group     BP 05/09/18 1649 138/78     Pulse Rate 05/09/18 1649 86     Resp 05/09/18 1649 (!) 24     Temp 05/09/18 1649 98.3 F (36.8 C)     Temp Source 05/09/18 1649 Oral     SpO2 05/09/18 1649 97 %     Weight 05/09/18 1649 182 lb (82.6 kg)     Height 05/09/18 1649 5' 3.5" (1.613 m)     Head Circumference --      Peak Flow --      Pain Score 05/09/18 1700 4     Pain Loc --      Pain Edu? --      Excl. in  [place]? --     Constitutional: Alert and oriented.  HEENT:      Head: Normocephalic and atraumatic.         Eyes: Conjunctivae are normal. Sclera is non-icteric.       Mouth/Throat: Mucous membranes are moist. No bolus observed at the oropharynx, patient is spitting up saliva      Neck: Supple with no signs of meningismus. Cardiovascular: Regular rate and rhythm. No murmurs, gallops, or rubs. 2+ symmetrical distal pulses are present in all extremities. No JVD. Respiratory: Normal respiratory effort. Lungs are clear to auscultation bilaterally. No wheezes, crackles, or rhonchi.  Gastrointestinal: Soft, non tender, and non distended with positive bowel sounds. No rebound or guarding. Musculoskeletal: Nontender with normal range  of motion in all extremities. No edema, cyanosis, or erythema of extremities. Neurologic: Normal speech and language. Face is symmetric. Moving all extremities. No gross focal neurologic deficits are appreciated. Skin: Skin is warm, dry and intact. No rash noted. Psychiatric: Mood and affect are normal. Speech and behavior are normal.  ____________________________________________   LABS (all labs ordered are listed, but only abnormal results are displayed)  Labs Reviewed - No data to display ____________________________________________  EKG  none  ____________________________________________  RADIOLOGY  none  ____________________________________________   PROCEDURES  Procedure(s) performed: None Procedures Critical Care performed:  None ____________________________________________   INITIAL IMPRESSION / ASSESSMENT AND PLAN / ED COURSE   83 y.o. female with a history of GERD, hiatal hernia, tortuous esophagus with abnormal motility who presents for evaluation of piece of steak impacted in her esophagus since 4 PM.  Patient is in no respiratory distress, she is unable to swallow her saliva without it coming back up immediately concerning for an esophageal  obstruction. Dr. Marius Ditch paged for endoscopy  Clinical Course as of May 10 2251  Tue May 09, 2018  1714 Spoke with Dr. Marius Ditch who will evaluate patient for emergent endoscopy.   [CV]    Clinical Course User Index [CV] Alfred Levins [place], MD     As part of my medical decision making, I reviewed the following data within the [facility] notes reviewed and incorporated, Old chart reviewed, A consult was requested and obtained from this/these consultant(s) GI, Notes from prior ED visits and [place] Controlled Substance Database    Pertinent labs & imaging results that were available during my care of the patient were reviewed by me and considered in my medical decision making (see chart for details).    ____________________________________________   FINAL CLINICAL IMPRESSION(S) / ED DIAGNOSES  Final diagnoses:  Esophageal obstruction due to food impaction       Note:  This document was prepared using Dragon voice recognition software and may include unintentional dictation errors.    Alfred Levins, [place], MD 05/09/18 [phone]

## 2018-05-09 NOTE — ED Notes (Signed)
ENDO personnel came and transported pt to ENDO at this time. Husband followed behind.

## 2018-05-09 NOTE — Anesthesia Preprocedure Evaluation (Signed)
Anesthesia Evaluation  Patient identified by MRN, date of birth, ID band Patient awake    Reviewed: Allergy & Precautions, NPO status , Patient's Chart, lab work & pertinent test results  History of Anesthesia Complications Negative for: history of anesthetic complications  Airway Mallampati: II       Dental  (+) Upper Dentures, Lower Dentures   Pulmonary neg shortness of breath, sleep apnea , neg COPD, neg recent URI,           Cardiovascular Exercise Tolerance: Good hypertension, Pt. on medications (-) angina(-) CAD, (-) Past MI, (-) Cardiac Stents and (-) CABG (-) dysrhythmias (-) Valvular Problems/Murmurs Rhythm:Regular Rate:Normal     Neuro/Psych Parkinson's negative psych ROS   GI/Hepatic Neg liver ROS, hiatal hernia, GERD  ,  Endo/Other  negative endocrine ROS  Renal/GU negative Renal ROS     Musculoskeletal   Abdominal (+) + obese,   Peds negative pediatric ROS (+)  Hematology negative hematology ROS (+)   Anesthesia Other Findings Past Medical History: No date: Back pain 2007: Breast cancer ([place])     Comment:  right breast lumpectomy with rad tx 2007: Cancer ([place])     Comment:  right breast No date: Collagen vascular disease ([place]) No date: Gout No date: Hard of hearing No date: Hypertension No date: Parkinson's disease ([place]) 2007: Personal history of radiation therapy     Comment:  F/U right breast cancer   Reproductive/Obstetrics negative OB ROS                             Anesthesia Physical  Anesthesia Plan  ASA: III  Anesthesia Plan: General   Post-op Pain Management:    Induction: Intravenous  PONV Risk Score and Plan: 3 and Ondansetron and Dexamethasone  Airway Management Planned: Oral ETT  Additional Equipment:   Intra-op Plan:   Post-operative Plan: Extubation in OR  Informed Consent: I have reviewed the patients History and Physical, chart,  labs and discussed the procedure including the risks, benefits and alternatives for the proposed anesthesia with the patient or authorized representative who has indicated his/her understanding and acceptance.     Plan Discussed with: Surgeon  Anesthesia Plan Comments:         Anesthesia Quick Evaluation

## 2018-05-09 NOTE — Consult Note (Signed)
Yvonne Darby, MD [address]  [place]  [place], [place] [address]  Main: [phone]  Fax: [phone] Pager: [phone]   Consultation  Referring Provider:     No ref. provider found Primary Care Physician:  Perrin Maltese, MD Primary Gastroenterologist:  Dr. Gustavo Lah         Reason for Consultation:     Dysphagia, food impaction  Date of Admission:  05/09/2018 Date of Consultation:  05/09/2018         HPI:   Yvonne Singleton is a 83 y.o. female with 28month h/o progressive dysphagia to solids and she had 2 episodes of food stuck in lower esophagus, passed spontaneously. She had a piece of steak around 3:30pm today, felt stuck and unable to swallow saliva. She now thinks, it may have passed. Had EGD in 2018, spastic lower esophagus, not on PPI. Also, has large hiatal hernia.  Paged by ER for urgent EGD  NSAIDs: none  Antiplts/Anticoagulants/Anti thrombotics: none  GI Procedures: EGD 04/2018 - Tortuous esophagus. - Abnormal esophageal motility, consistent with presbyesophagus. - Z-line variable. Biopsied. - Medium-sized hiatal hernia - Medium-sized hiatal hernia. - Erythematous mucosa in the antrum. Biopsied. - Normal examined duodenum.  Past Medical History:  Diagnosis Date  . Back pain   . [place]   right breast lumpectomy with rad tx  . Cancer ([facility] 2007   right breast  . [place]   . Gout   . Hard of hearing   . Hypertension   . Parkinson's disease ([place]   . Personal history of radiation therapy 2007   F/U right breast cancer    Past Surgical History:  Procedure Laterality Date  . ABDOMINAL HYSTERECTOMY    . BREAST LUMPECTOMY Right 2007   suspicious calcs, f/u with radiation  . CATARACT EXTRACTION Bilateral   . ESOPHAGOGASTRODUODENOSCOPY (EGD) WITH PROPOFOL N/A 05/25/2017   Procedure: ESOPHAGOGASTRODUODENOSCOPY (EGD) WITH PROPOFOL;  Surgeon: SLollie Sails MD;  [facility];   Service: Endoscopy;  Laterality: N/A;  . EYE SURGERY Bilateral   . HAND SURGERY    . HIP CLOSED REDUCTION Left 06/24/2015   Procedure: CLOSED REDUCTION HIP;  Surgeon: JDereck Leep MD;  Location: [facility] ORS;  Service: Orthopedics;  Laterality: Left;  . HIP CLOSED REDUCTION Left 02/09/2016   Procedure: CLOSED MANIPULATION HIP;  Surgeon: HEarnestine Leys MD;  Location: [facility] ORS;  Service: Orthopedics;  Laterality: Left;  . HIP SURGERY    . JOINT REPLACEMENT     bilateral hip  . OOPHORECTOMY    . TOTAL KNEE ARTHROPLASTY Bilateral     Prior to Admission medications   Medication Sig Start Date End Date Taking? Authorizing Provider  b complex vitamins tablet Take 1 tablet by mouth daily.   Yes [provider]  Calcium Carbonate-Vitamin D (CALCIUM 600+D) 600-200 MG-UNIT TABS Take 1 tablet by mouth daily.   Yes [provider]  carbidopa-levodopa (SINEMET CR) 50-200 MG per tablet Take 1 tablet by mouth at bedtime.    Yes [provider]  cholecalciferol (VITAMIN D) 1000 units tablet Take 2,000 Units by mouth daily.   Yes [provider]  DULoxetine (CYMBALTA) 30 MG capsule Take 30 mg by mouth daily.   Yes [provider]  furosemide (LASIX) 20 MG tablet Take 20 mg by mouth daily.    Yes [provider]  losartan (COZAAR) 100 MG tablet Take 100 mg by mouth daily.  Yes [provider]  allopurinol (ZYLOPRIM) 100 MG tablet Take 200 mg by mouth at bedtime.     [provider]  gabapentin (NEURONTIN) 100 MG capsule Limit 1 capsule by mouth per day if tolerated 06/17/16   Mohammed Kindle, MD  gabapentin (NEURONTIN) 100 MG capsule Limit 1 capsule by mouth per day or every other day if tolerated 06/17/16   Mohammed Kindle, MD  hydroxychloroquine (PLAQUENIL) 200 MG tablet Take 400 mg by mouth daily.     [provider]  oxyCODONE (OXY IR/ROXICODONE) 5 MG immediate release tablet Limit 1/2 - 1 tablet by mouth per day or 2  times per  day if tolerated 06/17/16   Mohammed Kindle, MD  tolterodine (DETROL LA) 4 MG 24 hr capsule Take 4 mg by mouth daily.    [provider]    Family History  Problem Relation Age of Onset  . Heart disease Mother   . Arthritis Mother   . Heart disease Father   . Ulcers Father   . Breast cancer Maternal Aunt      Social History   Tobacco Use  . Smoking status: Never Smoker  . Smokeless tobacco: Never Used  Substance Use Topics  . Alcohol use: No    Alcohol/week: 0.0 oz  . Drug use: No    Allergies as of 05/09/2018 - Review Complete 05/09/2018  Allergen Reaction Noted  . Trospium Nausea And Vomiting 01/26/2016  . Iodinated diagnostic agents Hives 01/28/2015  . Streptomycin Hives 01/28/2015    Review of Systems:    All systems reviewed and negative except where noted in HPI.   Physical Exam:  Vital signs in last 24 hours: Temp:  [98.3 F (36.8 C)] 98.3 F (36.8 C) (07/23 1649) Pulse Rate:  [77-86] 77 (07/23 1730) Resp:  [24] 24 (07/23 1649) BP: (138-147)/(78-85) 147/85 (07/23 1730) SpO2:  [95 %-97 %] 95 % (07/23 1730) Weight:  [182 lb (82.6 kg)] 182 lb (82.6 kg) (07/23 1649)   General:   Pleasant, cooperative in NAD Head:  Normocephalic and atraumatic. Eyes:   No icterus.   Conjunctiva pink. PERRLA. Ears:  Normal auditory acuity. Neck:  Supple; no masses or thyroidomegaly Lungs: Respirations even and unlabored. Lungs clear to auscultation bilaterally.   No wheezes, crackles, or rhonchi.  Heart:  Regular rate and rhythm;  Without murmur, clicks, rubs or gallops Abdomen:  Soft, nondistended, nontender. Normal bowel sounds. No appreciable masses or hepatomegaly.  No rebound or guarding.  Rectal:  Not performed. Msk:  Symmetrical without gross deformities.  Strength generalized weakness  Extremities:  Without edema, cyanosis or clubbing. Neurologic:  Alert and oriented x3;  grossly normal neurologically. Skin:  Intact without significant lesions or  rashes. Cervical Nodes:  No significant cervical adenopathy. Psych:  Alert and cooperative. Normal affect.  LAB RESULTS: CBC Latest Ref Rng & Units 09/28/2016 02/09/2016 06/24/2015  WBC 3.6 - 11.0 K/uL 18.7(H) 7.3 8.1  Hemoglobin 12.0 - 16.0 g/dL 13.9 13.8 12.8  Hematocrit 35.0 - 47.0 % 42.1 40.9 38.2  Platelets 150 - 440 K/uL 242 219 221    BMET BMP Latest Ref Rng & Units 05/18/2017 09/28/2016 02/09/2016  Glucose 65 - 99 mg/dL - 129(H) 84  BUN 6 - 20 mg/dL - 34(H) 33(H)  Creatinine 0.44 - 1.00 mg/dL 1.00 1.03(H) 0.80  Sodium 135 - 145 mmol/L - 136 139  Potassium 3.5 - 5.1 mmol/L - 3.6 4.9  Chloride 101 - 111 mmol/L - 103 106  CO2 22 -  32 mmol/L - 24 26  Calcium 8.9 - 10.3 mg/dL - 9.1 9.8    LFT Hepatic Function Latest Ref Rng & Units 09/28/2016 09/30/2014  Total Protein 6.5 - 8.1 g/dL 7.3 7.2  Albumin 3.5 - 5.0 g/dL 3.8 3.3(L)  AST 15 - 41 U/L 37 22  ALT 14 - 54 U/L 30 84(H)  Alk Phosphatase 38 - 126 U/L 75 137(H)  Total Bilirubin 0.3 - 1.2 mg/dL 0.9 0.2     STUDIES: No results found.    Impression / Plan:   Yvonne Singleton is a 83 y.o. female with parkinson's, large hiatal hernia, progressive dysphagia, presented with food impaction  - perform EGD today  I have discussed alternative options, risks & benefits,  which include, but are not limited to, bleeding, infection, perforation,respiratory complication & drug reaction.  The patient agrees with this plan & written consent will be obtained.    Thank you for involving me in the care of this patient.      LOS: 0 days   Sherri Sear, MD  05/09/2018, 6:25 PM   Note: This dictation was prepared with Dragon dictation along with smaller phrase technology. Any transcriptional errors that result from this process are unintentional.

## 2018-05-09 NOTE — Anesthesia Postprocedure Evaluation (Signed)
Anesthesia Post Note  Patient: Yvonne Singleton  Procedure(s) Performed: ESOPHAGOGASTRODUODENOSCOPY (EGD) WITH PROPOFOL (N/A )  Patient location during evaluation: PACU Anesthesia Type: General Level of consciousness: awake and alert Pain management: pain level controlled Vital Signs Assessment: post-procedure vital signs reviewed and stable Respiratory status: spontaneous breathing, nonlabored ventilation, respiratory function stable and patient connected to nasal cannula oxygen Cardiovascular status: blood pressure returned to baseline and stable Postop Assessment: no apparent nausea or vomiting Anesthetic complications: no     Last Vitals:  Vitals:   05/09/18 1918 05/09/18 1930  BP: 127/64 126/67  Pulse: 65 65  Resp: 18 17  Temp:  37.2 C  SpO2: 94% 96%    Last Pain:  Vitals:   05/09/18 1934  TempSrc:   PainSc: 0-No pain                 Martha Clan

## 2018-05-09 NOTE — ED Triage Notes (Signed)
Pt arrived after swallowing meat with unsuccessful passage of meat to stomach. Pt uncomfortable and anxious in triage. Pt able to communication and oxygen saturation 97%.

## 2018-05-09 NOTE — Transfer of Care (Signed)
Immediate Anesthesia Transfer of Care Note  Patient: Yvonne Singleton  Procedure(s) Performed: ESOPHAGOGASTRODUODENOSCOPY (EGD) WITH PROPOFOL (N/A )  Patient Location: PACU  Anesthesia Type:General  Level of Consciousness: awake, alert , oriented and patient cooperative  Airway & Oxygen Therapy: Patient Spontanous Breathing and Patient connected to face mask oxygen  Post-op Assessment: Report given to RN and Post -op Vital signs reviewed and stable  Post vital signs: Reviewed and stable  Last Vitals:  Vitals Value Taken Time  BP 119/67 05/09/2018  7:06 PM  Temp 36.9 C 05/09/2018  7:00 PM  Pulse 71 05/09/2018  7:07 PM  Resp 12 05/09/2018  7:07 PM  SpO2 100 % 05/09/2018  7:07 PM  Vitals shown include unvalidated device data.  Last Pain:  Vitals:   05/09/18 1700  TempSrc:   PainSc: 4          Complications: No apparent anesthesia complications

## 2018-05-09 NOTE — Anesthesia Procedure Notes (Signed)
Procedure Name: Intubation Date/Time: 05/09/2018 6:26 PM Performed by: Lendon Colonel, CRNA Pre-anesthesia Checklist: Patient identified, Patient being monitored, Timeout performed, Emergency Drugs available and Suction available Patient Re-evaluated:Patient Re-evaluated prior to induction Oxygen Delivery Method: Circle system utilized Preoxygenation: Pre-oxygenation with 100% oxygen Induction Type: IV induction, Rapid sequence and Cricoid Pressure applied Laryngoscope Size: Miller and 2 Grade View: Grade I Tube type: Oral Tube size: 7.0 mm Number of attempts: 1 Airway Equipment and Method: Stylet Placement Confirmation: ETT inserted through vocal cords under direct vision,  positive ETCO2 and breath sounds checked- equal and bilateral Secured at: 21 cm Dental Injury: Teeth and Oropharynx as per pre-operative assessment

## 2018-05-09 NOTE — ED Notes (Signed)
Pt given suction at this time. Pt able to suction without difficulties. MD at bedside

## 2018-05-09 NOTE — Anesthesia Post-op Follow-up Note (Signed)
Anesthesia QCDR form completed.        

## 2018-05-09 NOTE — Op Note (Signed)
[facility] Gastroenterology Patient Name: Yvonne Singleton Procedure Date: 05/09/2018 5:31 PM MRN: [id number] Account #: [id number] Date of Birth: [birth year] Admit Type: Outpatient Age: 83 Room: [facility] ENDO ROOM 4 Gender: Female Note Status: Finalized Procedure:            Upper GI endoscopy Indications:          Esophageal dysphagia, Foreign body in the esophagus Providers:            Lin Landsman MD, MD Medicines:            General Anesthesia Complications:        No immediate complications. Estimated blood loss: None. Procedure:            Pre-Anesthesia Assessment:                       - Prior to the procedure, a History and Physical was                        performed, and patient medications and allergies were                        reviewed. The patient is competent. The risks and                        benefits of the procedure and the sedation options and                        risks were discussed with the patient. All questions                        were answered and informed consent was obtained.                        Patient identification and proposed procedure were                        verified by the physician, the nurse, the                        anesthesiologist, the anesthetist and the technician in                        the pre-procedure area in the procedure room in the                        endoscopy suite. Mental Status Examination: alert and                        oriented. Airway Examination: normal oropharyngeal                        airway and neck mobility. Respiratory Examination:                        clear to auscultation. CV Examination: normal.                        Prophylactic Antibiotics: The patient does not require  prophylactic antibiotics. Prior Anticoagulants: The                        patient has taken no previous anticoagulant or                        antiplatelet agents. ASA Grade  Assessment: III - A                        patient with severe systemic disease. After reviewing                        the risks and benefits, the patient was deemed in                        satisfactory condition to undergo the procedure. The                        anesthesia plan was to use monitored anesthesia care                        (MAC). Immediately prior to administration of                        medications, the patient was re-assessed for adequacy                        to receive sedatives. The heart rate, respiratory rate,                        oxygen saturations, blood pressure, adequacy of                        pulmonary ventilation, and response to care were                        monitored throughout the procedure. The physical status                        of the patient was re-assessed after the procedure.                       After obtaining informed consent, the endoscope was                        passed under direct vision. Throughout the procedure,                        the patient's blood pressure, pulse, and oxygen                        saturations were monitored continuously. The Endoscope                        was introduced through the mouth, and advanced to the                        second part of duodenum. The upper GI endoscopy was  accomplished without difficulty. The patient tolerated                        the procedure well. Findings:      Food was found in the lower third of the esophagus. pushed down into       stomach without any resistance Estimated blood loss: none.      Petechiae in upper esophagus from retching      One benign-appearing, intrinsic mild stenosis was found at the       gastroesophageal junction. The stenosis was traversed. A TTS dilator was       passed through the scope. Dilation with an 18-19-20 mm balloon dilator       was performed to 19 mm. The dilation site was examined following        endoscope reinsertion and showed mild improvement in luminal narrowing.      A large hiatal hernia was present.      A few dispersed, diminutive non-bleeding erosions were found in the       cardia and in the gastric body. There were no stigmata of recent       bleeding.      The cardia and gastric fundus were normal on retroflexion.      The duodenal bulb and second portion of the duodenum were normal. Impression:           - Food in the lower third of the esophagus.                       - Benign-appearing esophageal stenosis. Dilated.                       - Large hiatal hernia.                       - Non-bleeding erosive gastropathy.                       - Normal duodenal bulb and second portion of the                        duodenum.                       - No specimens collected. Recommendation:       - Discharge patient to home (with spouse).                       - Chopped diet.                       - Continue present medications.                       - Use a proton pump inhibitor PO daily indefinitely. Procedure Code(s):    --- Professional ---                       [phone], Esophagogastroduodenoscopy, flexible, transoral;                        with transendoscopic balloon dilation of esophagus                        (less  than 30 mm diameter) Diagnosis Code(s):    --- Professional ---                       Q19.758I, Food in esophagus causing other injury,                        initial encounter                       K22.2, Esophageal obstruction                       K44.9, Diaphragmatic hernia without obstruction or                        gangrene                       K31.89, Other diseases of stomach and duodenum                       R13.14, Dysphagia, pharyngoesophageal phase                       T18.108A, Unspecified foreign body in esophagus causing                        other injury, initial encounter CPT copyright 2017 [employer]. All rights  reserved. The codes documented in this report are preliminary and upon coder review may  be revised to meet current compliance requirements. Dr. Ulyess Mort Lin Landsman MD, MD 05/09/2018 6:49:35 PM This report has been signed electronically. Number of Addenda: 0 Note Initiated On: 05/09/2018 5:31 PM      [facility]

## 2018-05-09 NOTE — Discharge Instructions (Signed)

## 2018-05-09 NOTE — ED Notes (Signed)
Report given to Lorrie from ENDO.

## 2018-05-11 ENCOUNTER — Encounter: Payer: Self-pay | Admitting: Gastroenterology

## 2018-05-15 ENCOUNTER — Ambulatory Visit: Payer: Medicare Other | Admitting: Physical Therapy

## 2018-05-15 ENCOUNTER — Encounter: Payer: Self-pay | Admitting: Physical Therapy

## 2018-05-15 DIAGNOSIS — R2681 Unsteadiness on feet: Secondary | ICD-10-CM

## 2018-05-15 DIAGNOSIS — R262 Difficulty in walking, not elsewhere classified: Secondary | ICD-10-CM

## 2018-05-15 DIAGNOSIS — R278 Other lack of coordination: Secondary | ICD-10-CM

## 2018-05-15 DIAGNOSIS — M6281 Muscle weakness (generalized): Secondary | ICD-10-CM

## 2018-05-15 NOTE — Therapy (Signed)
[place] MAIN [facility] SERVICES [address] [place], [place], [address] Phone: [phone]   Fax:  [phone]  Physical Therapy Treatment  Patient Details  Name: Yvonne Singleton MRN: [id number] Date of Birth: [birth year] Referring Provider: Dr. Jennings Books   Encounter Date: 05/15/2018  PT End of Session - 05/15/18 0852    Visit Number  9    Number of Visits  13    Date for PT Re-Evaluation  06/26/18    Authorization Type  9/10 progress note    PT Start Time  0845    PT Stop Time  0930    PT Time Calculation (min)  45 min    Equipment Utilized During Treatment  Gait belt    Activity Tolerance  Patient tolerated treatment well;No increased pain    Behavior During Therapy  WFL for tasks assessed/performed       Past Medical History:  Diagnosis Date  . Back pain   . Breast cancer ([place]) 2007   right breast lumpectomy with rad tx  . Cancer [facility]) 2007   right breast  . Collagen vascular disease ([place])   . Gout   . Hard of hearing   . Hypertension   . Parkinson's disease ([place])   . Personal history of radiation therapy 2007   F/U right breast cancer    Past Surgical History:  Procedure Laterality Date  . ABDOMINAL HYSTERECTOMY    . BREAST LUMPECTOMY Right 2007   suspicious calcs, f/u with radiation  . CATARACT EXTRACTION Bilateral   . ESOPHAGOGASTRODUODENOSCOPY (EGD) WITH PROPOFOL N/A 05/25/2017   Procedure: ESOPHAGOGASTRODUODENOSCOPY (EGD) WITH PROPOFOL;  Surgeon: Lollie Sails, MD;  Location: [facility] ENDOSCOPY;  Service: Endoscopy;  Laterality: N/A;  . ESOPHAGOGASTRODUODENOSCOPY (EGD) WITH PROPOFOL N/A 05/09/2018   Procedure: ESOPHAGOGASTRODUODENOSCOPY (EGD) WITH PROPOFOL;  Surgeon: Lin Landsman, MD;  Location: [facility] ENDOSCOPY;  Service: Gastroenterology;  Laterality: N/A;  . EYE SURGERY Bilateral   . HAND SURGERY    . HIP CLOSED REDUCTION Left 06/24/2015   Procedure: CLOSED REDUCTION HIP;  Surgeon: Dereck Leep, MD;   Location: [facility] ORS;  Service: Orthopedics;  Laterality: Left;  . HIP CLOSED REDUCTION Left 02/09/2016   Procedure: CLOSED MANIPULATION HIP;  Surgeon: Earnestine Leys, MD;  Location: [facility] ORS;  Service: Orthopedics;  Laterality: Left;  . HIP SURGERY    . JOINT REPLACEMENT     bilateral hip  . OOPHORECTOMY    . TOTAL KNEE ARTHROPLASTY Bilateral     There were no vitals filed for this visit.  Subjective Assessment - 05/15/18 0850    Subjective  Pt reports she had a chocking incident.  No recent balance/falls issues. HEP is going well but still has difficulty with standing hip ABDCT on the RLE.     Pertinent History  Pt arrived late to appointment, limiting evaluation. Pt presents with reports of imbalance and weakness.  Pt reports she stopped exercising this past winter so her balance has gotten worse.  Pt has been using Bil crutches at all times for 3-4 years.  Pt uses crutches instead of a walker due to chronic back and Bil wrist pain (R>L) due to gout and RA. Pt denies any falls in the past 6 months but has fallen in the past. Pt has had some issues with dizziness when taking Sinemet and Neurontin together so she has separated her schedule of taking these per Dr. Sharlet Salina. Pt reports she has trouble with mopping, vacuuming, sweeping, walking, outside work (  flowers, etc).  Goals: to improve with all of these and to be able to do more in the kitchen.  Pt with h/o Bil TKA and THAs, back surgery. Pt reports her R THA dislocated x3 and L THA dislocated x2 but last time this happened was 3 years ago.  Pt with h/o osteoporosis and RA.    Pt reports numbness and tingling and pain down LLE.  Pt additionally has numbness and tingling RLE but not as worse as on the L side.  These symptoms have been present for over a year and is being followed by Dr. Sharlet Salina for this.  Pt denies any bowel or bladder issues, saddle anesthesia, night sweats, sudden changes in weight.  Pt reports chronic pain in wrists, neck, back, Bil  hip, BLEs.  Current pain in back: 4/10, best pain: 2/10, worst pain: 6/10.     Limitations  Walking;House hold activities;Standing    How long can you sit comfortably?  no issues    How long can you stand comfortably?  10 minutes    How long can you walk comfortably?  15 minutes    Diagnostic tests  Pt had MRI lumbar spine 07/08/15: L5-S1 mild disc desiccation broad-based disc bulging extending to the R neural foramen, moderate to severe facet degenerative changes, moderate to severe R foraminal stenosis, L4-5 mild disc dessication broad-based disc bulging, moderate facet degenerative changes, moderate Bil lateral recess stenosis and mild to moderate central stenosis, mild Bil foraminal stenosis, at L3-4 there is severe degenerative disc disease, moderate to severe facet degenerative changes with mild central stenosis, mild Bil foraminal stenosis, at L2-3 there is severe degenerative disc disease, R hemilaminectomy defect, L1-2 mild disc dessication broad-based disc bulging. She has had periodic steroid injections with mild to moderate relief. Pt is followed at [facility] for this. Plan is to schedule a L L5-S1 ESI if her pain worsens.    Patient Stated Goals  see above    Currently in Pain?  Yes    Pain Score  5     Pain Location  Back    Pain Orientation  Lower    Pain Descriptors / Indicators  Aching    Pain Onset  More than a month ago       Therapeutic exercise: Standing on 1/2 bolster (Flat side up): Heel/toe rock x15 with finger tip hold for balance with cues to keep knee straight for better ankle strategies; Feet in neutral, BUE wand flexion x15 reps with min A for safety and cues to improve weight shift for better stance control; patient was able to utilize ankle strategies well with less loss of balance; Tandem stance on 1/2 bolster 10 sec hold unsupported x2 reps each foot in front with min A for safety and cues to improve upper weight shift and trunk control for better balance in  narrow base of support;  LAQ with 2 # BLE x 15 x 2 with 3 sec hold Marching with 2 # x 15 x 2 BLE  Hip extension standing with knee ext x 15 BLE Hip abd sidelying left and right x 15 BLE hooklying abd/ER x 15 x 2 with RTB Hooklying marching with 2 lbs x 15 x 2 Bridges x 10 Standing hip abd x 15 x 2  BLE  Patient reports pain decreased to 2/10 in sitting and supine  PT Education - 05/15/18 0851    Education Details  HEP    Person(s) Educated  Patient    Methods  Explanation;Demonstration    Comprehension  Verbalized understanding;Returned demonstration;Verbal cues required       PT Short Term Goals - 03/20/18 1129      PT SHORT TERM GOAL #1   Title  Pt will independently complete HEP at least 4 days/wk for improved carryover between sessions    Time  2    Period  Weeks    Status  New        PT Long Term Goals - 05/08/18 4970      PT LONG TERM GOAL #1   Title  Pt's ABC scale will improve to at least 55% to demonstrate improvement in pt's perceived balance    Baseline  35%    Time  8    Period  Weeks    Status  Partially Met    Target Date  06/26/18      PT LONG TERM GOAL #2   Title  Pt's 5xSTS will improve to equal to or less than 15 seconds to demonstrates improved balance and strength    Baseline  26.47 seconds (pt pushing on thighs to stand)  05/08/18  23.20 sec    Time  8    Period  Weeks    Status  Partially Met    Target Date  06/26/18      PT LONG TERM GOAL #3   Title  Pt's Berg Balance Test will improve to at least 45/56 to demonstrate improved balance    Baseline  28/56:  05/08/18    Time  8    Period  Weeks    Status  Partially Met    Target Date  06/26/18      PT LONG TERM GOAL #4   Title  Pt will improve 47mT time to at least 0.80 m/s for improved safety with ambulation in the community    Baseline  0.52 m/s using crutches: .68 m/sec    Time  8    Period  Weeks    Status  Partially Met    Target Date   06/26/18      PT LONG TERM GOAL #5   Title  Pt's TUG will improve to equal to or less than 13 seconds to demonstrate improved balance, gait speed, and strength    Baseline  22.47 seconds  05/08/18  20.30    Time  8    Period  Weeks    Status  Partially Met    Target Date  06/26/18            Plan - 05/15/18 0852    Clinical Impression Statement  Dynamic and static balance interventions continued today, with a focus on unilateral LE stability. Pt tolerated all exercises well. Intermediate dynamic standing balance tasks were progressed with cga needed for reaching outside of her base of support. Pt would continue to benefit from skilled therapy services in order to address balance deficits in order to decrease fall risk and improve mobility    Rehab Potential  Good    PT Frequency  2x / week    PT Duration  8 weeks    PT Treatment/Interventions  ADLs/Self Care Home Management;Aquatic Therapy;Biofeedback;Cryotherapy;Electrical Stimulation;Iontophoresis '4mg'$ /ml Dexamethasone;Moist Heat;Traction;Ultrasound;DME Instruction;Gait training;Stair training;Functional mobility training;Therapeutic activities;Therapeutic exercise;Neuromuscular re-education;Balance training;Patient/family education;Manual techniques;Orthotic Fit/Training;Dry needling;Passive range of motion;Compression bandaging;Energy conservation;Taping    PT Next Visit Plan  progress strengthening program  and introduce balance interventions    PT Home Exercise Plan  seated marching, sit<>stand, standing hip Abd, standing hip E    Consulted and Agree with Plan of Care  Patient       Patient will benefit from skilled therapeutic intervention in order to improve the following deficits and impairments:  Abnormal gait, Decreased activity tolerance, Decreased balance, Decreased coordination, Decreased endurance, Decreased knowledge of use of DME, Decreased range of motion, Decreased mobility, Decreased safety awareness, Decreased strength,  Difficulty walking, Hypomobility, Increased edema, Increased fascial restricitons, Increased muscle spasms, Impaired perceived functional ability, Impaired flexibility, Impaired sensation, Improper body mechanics, Postural dysfunction, Pain  Visit Diagnosis: Unsteadiness on feet  Muscle weakness (generalized)  Other lack of coordination  Difficulty in walking, not elsewhere classified     Problem List Patient Active Problem List   Diagnosis Date Noted  . History of bilateral hip replacements 05/28/2015  . Spinal stenosis, lumbar region, with neurogenic claudication 05/28/2015  . DDD (degenerative disc disease), lumbar 03/13/2015  . Lumbar post-laminectomy syndrome 03/13/2015  . Degenerative cervical disc 03/13/2015  . Status post bilateral total hip replacement 03/13/2015  . Sacroiliac joint dysfunction 03/13/2015  . History of surgery on upper extremity 03/13/2015  . DJD of shoulder 03/13/2015  . Rheumatoid arthritis ([place]) 02/17/2015    Alanson Puls, [place] DPT 05/15/2018, 8:54 AM  [place] MAIN [facility] SERVICES [address] [place], [place], [address] Phone: [phone]   Fax:  [phone]  Name: Yvonne Singleton MRN: [id number] Date of Birth: June 12, 1935

## 2018-05-17 ENCOUNTER — Encounter: Payer: Self-pay | Admitting: Physical Therapy

## 2018-05-17 ENCOUNTER — Ambulatory Visit: Payer: Medicare Other | Admitting: Physical Therapy

## 2018-05-17 DIAGNOSIS — M6281 Muscle weakness (generalized): Secondary | ICD-10-CM

## 2018-05-17 DIAGNOSIS — R2681 Unsteadiness on feet: Secondary | ICD-10-CM

## 2018-05-17 DIAGNOSIS — R262 Difficulty in walking, not elsewhere classified: Secondary | ICD-10-CM

## 2018-05-17 DIAGNOSIS — R278 Other lack of coordination: Secondary | ICD-10-CM

## 2018-05-17 NOTE — Therapy (Signed)
[place] MAIN [facility] SERVICES [address] [place], [place], [address] Phone: [phone]   Fax:  [phone]  Physical Therapy Treatment / Physical Therapy Progress Note   Dates of reporting period  03/20/18   to   05/17/18  Patient Details  Name: Yvonne Singleton MRN: [id number] Date of Birth: 06-24-1935 Referring Provider: Dr. Jennings Books   Encounter Date: 05/17/2018  PT End of Session - 05/17/18 1114    Visit Number  10    Number of Visits  13    Date for PT Re-Evaluation  06/26/18    Authorization Type  10/10 progress note    PT Start Time  1100    PT Stop Time  1145    PT Time Calculation (min)  45 min    Equipment Utilized During Treatment  Gait belt    Activity Tolerance  Patient tolerated treatment well;No increased pain    Behavior During Therapy  WFL for tasks assessed/performed       Past Medical History:  Diagnosis Date  . Back pain   . Breast cancer ([place]) 2007   right breast lumpectomy with rad tx  . Cancer [facility]) 2007   right breast  . Collagen vascular disease ([place])   . Gout   . Hard of hearing   . Hypertension   . Parkinson's disease ([place])   . Personal history of radiation therapy 2007   F/U right breast cancer    Past Surgical History:  Procedure Laterality Date  . ABDOMINAL HYSTERECTOMY    . BREAST LUMPECTOMY Right 2007   suspicious calcs, f/u with radiation  . CATARACT EXTRACTION Bilateral   . ESOPHAGOGASTRODUODENOSCOPY (EGD) WITH PROPOFOL N/A 05/25/2017   Procedure: ESOPHAGOGASTRODUODENOSCOPY (EGD) WITH PROPOFOL;  Surgeon: Lollie Sails, MD;  Location: [facility] ENDOSCOPY;  Service: Endoscopy;  Laterality: N/A;  . ESOPHAGOGASTRODUODENOSCOPY (EGD) WITH PROPOFOL N/A 05/09/2018   Procedure: ESOPHAGOGASTRODUODENOSCOPY (EGD) WITH PROPOFOL;  Surgeon: Lin Landsman, MD;  Location: [facility] ENDOSCOPY;  Service: Gastroenterology;  Laterality: N/A;  . EYE SURGERY Bilateral   . HAND SURGERY    . HIP CLOSED  REDUCTION Left 06/24/2015   Procedure: CLOSED REDUCTION HIP;  Surgeon: Dereck Leep, MD;  Location: [facility] ORS;  Service: Orthopedics;  Laterality: Left;  . HIP CLOSED REDUCTION Left 02/09/2016   Procedure: CLOSED MANIPULATION HIP;  Surgeon: Earnestine Leys, MD;  Location: [facility] ORS;  Service: Orthopedics;  Laterality: Left;  . HIP SURGERY    . JOINT REPLACEMENT     bilateral hip  . OOPHORECTOMY    . TOTAL KNEE ARTHROPLASTY Bilateral     There were no vitals filed for this visit.  Subjective Assessment - 05/17/18 1113    Subjective  Pt reports she had a chocking incident.  No recent balance/falls issues. HEP is going well but still has difficulty with standing hip ABDCT on the RLE.     Pertinent History  Pt arrived late to appointment, limiting evaluation. Pt presents with reports of imbalance and weakness.  Pt reports she stopped exercising this past winter so her balance has gotten worse.  Pt has been using Bil crutches at all times for 3-4 years.  Pt uses crutches instead of a walker due to chronic back and Bil wrist pain (R>L) due to gout and RA. Pt denies any falls in the past 6 months but has fallen in the past. Pt has had some issues with dizziness when taking Sinemet and Neurontin together so she has separated her  schedule of taking these per Dr. Sharlet Salina. Pt reports she has trouble with mopping, vacuuming, sweeping, walking, outside work (flowers, etc).  Goals: to improve with all of these and to be able to do more in the kitchen.  Pt with h/o Bil TKA and THAs, back surgery. Pt reports her R THA dislocated x3 and L THA dislocated x2 but last time this happened was 3 years ago.  Pt with h/o osteoporosis and RA.    Pt reports numbness and tingling and pain down LLE.  Pt additionally has numbness and tingling RLE but not as worse as on the L side.  These symptoms have been present for over a year and is being followed by Dr. Sharlet Salina for this.  Pt denies any bowel or bladder issues, saddle anesthesia,  night sweats, sudden changes in weight.  Pt reports chronic pain in wrists, neck, back, Bil hip, BLEs.  Current pain in back: 4/10, best pain: 2/10, worst pain: 6/10.     Limitations  Walking;House hold activities;Standing    How long can you sit comfortably?  no issues    How long can you stand comfortably?  10 minutes    How long can you walk comfortably?  15 minutes    Diagnostic tests  Pt had MRI lumbar spine 07/08/15: L5-S1 mild disc desiccation broad-based disc bulging extending to the R neural foramen, moderate to severe facet degenerative changes, moderate to severe R foraminal stenosis, L4-5 mild disc dessication broad-based disc bulging, moderate facet degenerative changes, moderate Bil lateral recess stenosis and mild to moderate central stenosis, mild Bil foraminal stenosis, at L3-4 there is severe degenerative disc disease, moderate to severe facet degenerative changes with mild central stenosis, mild Bil foraminal stenosis, at L2-3 there is severe degenerative disc disease, R hemilaminectomy defect, L1-2 mild disc dessication broad-based disc bulging. She has had periodic steroid injections with mild to moderate relief. Pt is followed at [facility] pain clinic for this. Plan is to schedule a L L5-S1 ESI if her pain worsens.    Patient Stated Goals  see above    Currently in Pain?  Yes    Pain Score  5     Pain Location  Back    Pain Orientation  Lower    Pain Descriptors / Indicators  Aching    Pain Onset  More than a month ago       Therapeutic exercise and neuromuscular training: Standing with hip abd x 10 x 2 BLE, difficulty due to weakness Standing with hip ext x 10 x 2 BLE Standing on blue foam and head turns x 2 mins feet apart, with UE support Standing on blue foam and head turns x 2 mins together apart, with UE support Stepping over hurdle left and right and fwd/bwd, x 5 reps needs UE support with LOB backwards 1/2 foam flat side up and balance with head turns left and right feet  apart and feet together,cues for better posture Side stepping on blue balance beam left and right x 5 lengths, cues for going slowly and she needs UE support with LOB backwards supine SLR x 10 with intervals hooklying marching x 20 x 2 hooklying hip ER/abd x 20 x 2     CGA and  mod verbal cues used throughout with increased in postural sway and LOB most seen with narrow base of support and while on uneven surfaces. Patient needs UE support during 50 % of  balance activities and has back pain and fatigue and needs  a seated rest period.                         PT Education - 05/17/18 1114    Education Details  HEP    Person(s) Educated  Patient    Methods  Explanation    Comprehension  Verbalized understanding       PT Short Term Goals - 03/20/18 1129      PT SHORT TERM GOAL #1   Title  Pt will independently complete HEP at least 4 days/wk for improved carryover between sessions    Time  2    Period  Weeks    Status  New        PT Long Term Goals - 05/08/18 1962      PT LONG TERM GOAL #1   Title  Pt's ABC scale will improve to at least 55% to demonstrate improvement in pt's perceived balance    Baseline  35%    Time  8    Period  Weeks    Status  Partially Met    Target Date  06/26/18      PT LONG TERM GOAL #2   Title  Pt's 5xSTS will improve to equal to or less than 15 seconds to demonstrates improved balance and strength    Baseline  26.47 seconds (pt pushing on thighs to stand)  05/08/18  23.20 sec    Time  8    Period  Weeks    Status  Partially Met    Target Date  06/26/18      PT LONG TERM GOAL #3   Title  Pt's Berg Balance Test will improve to at least 45/56 to demonstrate improved balance    Baseline  28/56:  05/08/18    Time  8    Period  Weeks    Status  Partially Met    Target Date  06/26/18      PT LONG TERM GOAL #4   Title  Pt will improve 91mT time to at least 0.80 m/s for improved safety with ambulation in the community     Baseline  0.52 m/s using crutches: .68 m/sec    Time  8    Period  Weeks    Status  Partially Met    Target Date  06/26/18      PT LONG TERM GOAL #5   Title  Pt's TUG will improve to equal to or less than 13 seconds to demonstrate improved balance, gait speed, and strength    Baseline  22.47 seconds  05/08/18  20.30    Time  8    Period  Weeks    Status  Partially Met    Target Date  06/26/18            Plan - 05/17/18 1116    Clinical Impression Statement Patient's condition has the potential to improve in response to therapy. Maximum improvement is yet to be obtained. The anticipated improvement is attainable and reasonable in a generally predictable time. Start date of 03/20/18 reporting period  end date of reporting period 7 /31/19..Marland KitchenPatient reports  Pt requires direction and verbal cues for correct performance of standing dynamic balance exercises and strengthening exercises. .Patient has fatigue with endurance and difficulty with transfers and standing tasks.  Patient struggles with posture and ability to perform sit to stand due to weakness, as well as balance with mobility.  Pt encouraged continuing HEP   Patient  will benefit from continued skilled PT to improve mobility and safety.    Rehab Potential  Good    PT Frequency  2x / week    PT Duration  8 weeks    PT Treatment/Interventions  ADLs/Self Care Home Management;Aquatic Therapy;Biofeedback;Cryotherapy;Electrical Stimulation;Iontophoresis 75m/ml Dexamethasone;Moist Heat;Traction;Ultrasound;DME Instruction;Gait training;Stair training;Functional mobility training;Therapeutic activities;Therapeutic exercise;Neuromuscular re-education;Balance training;Patient/family education;Manual techniques;Orthotic Fit/Training;Dry needling;Passive range of motion;Compression bandaging;Energy conservation;Taping    PT Next Visit Plan  progress strengthening program and introduce balance interventions    PT Home Exercise Plan  seated  marching, sit<>stand, standing hip Abd, standing hip E    Consulted and Agree with Plan of Care  Patient       Patient will benefit from skilled therapeutic intervention in order to improve the following deficits and impairments:  Abnormal gait, Decreased activity tolerance, Decreased balance, Decreased coordination, Decreased endurance, Decreased knowledge of use of DME, Decreased range of motion, Decreased mobility, Decreased safety awareness, Decreased strength, Difficulty walking, Hypomobility, Increased edema, Increased fascial restricitons, Increased muscle spasms, Impaired perceived functional ability, Impaired flexibility, Impaired sensation, Improper body mechanics, Postural dysfunction, Pain  Visit Diagnosis: Unsteadiness on feet  Muscle weakness (generalized)  Other lack of coordination  Difficulty in walking, not elsewhere classified     Problem List Patient Active Problem List   Diagnosis Date Noted  . History of bilateral hip replacements 05/28/2015  . Spinal stenosis, lumbar region, with neurogenic claudication 05/28/2015  . DDD (degenerative disc disease), lumbar 03/13/2015  . Lumbar post-laminectomy syndrome 03/13/2015  . Degenerative cervical disc 03/13/2015  . Status post bilateral total hip replacement 03/13/2015  . Sacroiliac joint dysfunction 03/13/2015  . History of surgery on upper extremity 03/13/2015  . DJD of shoulder 03/13/2015  . [place] 02/17/2015    MAlanson Puls PVirginiaDPT 05/17/2018, 11:17 AM  [place] [facility] [address]R[place]un[place] [address]Phone: [phone]  Fax:  [phone] Name: Yvonne BeattieMRN: [id number] of Birth: [birth year]

## 2018-05-22 ENCOUNTER — Ambulatory Visit: Payer: Medicare Other | Attending: Neurology | Admitting: Physical Therapy

## 2018-05-22 ENCOUNTER — Encounter: Payer: Self-pay | Admitting: Physical Therapy

## 2018-05-22 DIAGNOSIS — R278 Other lack of coordination: Secondary | ICD-10-CM | POA: Diagnosis present

## 2018-05-22 DIAGNOSIS — R2681 Unsteadiness on feet: Secondary | ICD-10-CM | POA: Diagnosis not present

## 2018-05-22 DIAGNOSIS — R262 Difficulty in walking, not elsewhere classified: Secondary | ICD-10-CM | POA: Diagnosis present

## 2018-05-22 DIAGNOSIS — M6281 Muscle weakness (generalized): Secondary | ICD-10-CM | POA: Diagnosis present

## 2018-05-22 NOTE — Therapy (Signed)
[place] MAIN [facility] SERVICES [address] [place], [place], [address] Phone: [phone]   Fax:  [phone]  Physical Therapy Treatment  Patient Details  Name: Yvonne Singleton MRN: [id number] Date of Birth: 04-10-1935 Referring Provider: Dr. Jennings Books   Encounter Date: 05/22/2018  PT End of Session - 05/22/18 1023    Visit Number  11    Number of Visits  13    Date for PT Re-Evaluation  06/26/18    Authorization Type  1/10 progress note    PT Start Time  1012    PT Stop Time  1050    PT Time Calculation (min)  38 min    Equipment Utilized During Treatment  Gait belt    Activity Tolerance  Patient tolerated treatment well;No increased pain    Behavior During Therapy  WFL for tasks assessed/performed       Past Medical History:  Diagnosis Date  . Back pain   . Breast cancer ([place]) 2007   right breast lumpectomy with rad tx  . Cancer [facility]) 2007   right breast  . Collagen vascular disease ([place])   . Gout   . Hard of hearing   . Hypertension   . Parkinson's disease ([place])   . Personal history of radiation therapy 2007   F/U right breast cancer    Past Surgical History:  Procedure Laterality Date  . ABDOMINAL HYSTERECTOMY    . BREAST LUMPECTOMY Right 2007   suspicious calcs, f/u with radiation  . CATARACT EXTRACTION Bilateral   . ESOPHAGOGASTRODUODENOSCOPY (EGD) WITH PROPOFOL N/A 05/25/2017   Procedure: ESOPHAGOGASTRODUODENOSCOPY (EGD) WITH PROPOFOL;  Surgeon: Lollie Sails, MD;  Location: [facility] ENDOSCOPY;  Service: Endoscopy;  Laterality: N/A;  . ESOPHAGOGASTRODUODENOSCOPY (EGD) WITH PROPOFOL N/A 05/09/2018   Procedure: ESOPHAGOGASTRODUODENOSCOPY (EGD) WITH PROPOFOL;  Surgeon: Lin Landsman, MD;  Location: [facility] ENDOSCOPY;  Service: Gastroenterology;  Laterality: N/A;  . EYE SURGERY Bilateral   . HAND SURGERY    . HIP CLOSED REDUCTION Left 06/24/2015   Procedure: CLOSED REDUCTION HIP;  Surgeon: Dereck Leep, MD;   Location: [facility] ORS;  Service: Orthopedics;  Laterality: Left;  . HIP CLOSED REDUCTION Left 02/09/2016   Procedure: CLOSED MANIPULATION HIP;  Surgeon: Earnestine Leys, MD;  Location: [facility] ORS;  Service: Orthopedics;  Laterality: Left;  . HIP SURGERY    . JOINT REPLACEMENT     bilateral hip  . OOPHORECTOMY    . TOTAL KNEE ARTHROPLASTY Bilateral     There were no vitals filed for this visit.  Subjective Assessment - 05/22/18 1022    Subjective  Pt reports she has some back pain.  No recent balance/falls issues. HEP is going well but still has difficulty with standing hip ABD on the RLE.     Pertinent History  Pt arrived late to appointment, limiting evaluation. Pt presents with reports of imbalance and weakness.  Pt reports she stopped exercising this past winter so her balance has gotten worse.  Pt has been using Bil crutches at all times for 3-4 years.  Pt uses crutches instead of a walker due to chronic back and Bil wrist pain (R>L) due to gout and RA. Pt denies any falls in the past 6 months but has fallen in the past. Pt has had some issues with dizziness when taking Sinemet and Neurontin together so she has separated her schedule of taking these per Dr. Sharlet Salina. Pt reports she has trouble with mopping, vacuuming, sweeping, walking, outside work (  flowers, etc).  Goals: to improve with all of these and to be able to do more in the kitchen.  Pt with h/o Bil TKA and THAs, back surgery. Pt reports her R THA dislocated x3 and L THA dislocated x2 but last time this happened was 3 years ago.  Pt with h/o osteoporosis and RA.    Pt reports numbness and tingling and pain down LLE.  Pt additionally has numbness and tingling RLE but not as worse as on the L side.  These symptoms have been present for over a year and is being followed by Dr. Sharlet Salina for this.  Pt denies any bowel or bladder issues, saddle anesthesia, night sweats, sudden changes in weight.  Pt reports chronic pain in wrists, neck, back, Bil hip,  BLEs.  Current pain in back: 4/10, best pain: 2/10, worst pain: 6/10.     Limitations  Walking;House hold activities;Standing    How long can you sit comfortably?  no issues    How long can you stand comfortably?  10 minutes    How long can you walk comfortably?  15 minutes    Diagnostic tests  Pt had MRI lumbar spine 07/08/15: L5-S1 mild disc desiccation broad-based disc bulging extending to the R neural foramen, moderate to severe facet degenerative changes, moderate to severe R foraminal stenosis, L4-5 mild disc dessication broad-based disc bulging, moderate facet degenerative changes, moderate Bil lateral recess stenosis and mild to moderate central stenosis, mild Bil foraminal stenosis, at L3-4 there is severe degenerative disc disease, moderate to severe facet degenerative changes with mild central stenosis, mild Bil foraminal stenosis, at L2-3 there is severe degenerative disc disease, R hemilaminectomy defect, L1-2 mild disc dessication broad-based disc bulging. She has had periodic steroid injections with mild to moderate relief. Pt is followed at [facility] pain clinic for this. Plan is to schedule a L L5-S1 ESI if her pain worsens.    Patient Stated Goals  see above    Currently in Pain?  Yes    Pain Score  4     Pain Location  Back    Pain Orientation  Lower    Pain Descriptors / Indicators  Aching    Pain Type  Chronic pain    Pain Onset  More than a month ago    Multiple Pain Sites  No         Therapeutic exercise: Stepping fwd/ bwd over 1/2 bolster x 15 : Stepping side to side over 1/2 bolster x 15 : Side stepping in parallel bars x 5 laps with UE support  Heel raises x 10 x 2  Marching with  x 15  BLE Hip extension standing with kneeextx 15 BLE Standing hip abdx 15 x 2BLE  CGA and Min verbal cues used throughout .Marland Kitchen Continues to have strength deficits typical with diagnosis. Patient performs intermediate level exercises without pain behaviors and needs verbal cuing for  postural alignment and head positioning                      PT Education - 05/22/18 1022    Education Details  HEP    Person(s) Educated  Patient    Methods  Explanation;Demonstration;Tactile cues;Verbal cues    Comprehension  Verbalized understanding;Returned demonstration       PT Short Term Goals - 03/20/18 1129      PT SHORT TERM GOAL #1   Title  Pt will independently complete HEP at least 4 days/wk for improved  carryover between sessions    Time  2    Period  Weeks    Status  New        PT Long Term Goals - 05/08/18 0922      PT LONG TERM GOAL #1   Title  Pt's ABC scale will improve to at least 55% to demonstrate improvement in pt's perceived balance    Baseline  35%    Time  8    Period  Weeks    Status  Partially Met    Target Date  06/26/18      PT LONG TERM GOAL #2   Title  Pt's 5xSTS will improve to equal to or less than 15 seconds to demonstrates improved balance and strength    Baseline  26.47 seconds (pt pushing on thighs to stand)  05/08/18  23.20 sec    Time  8    Period  Weeks    Status  Partially Met    Target Date  06/26/18      PT LONG TERM GOAL #3   Title  Pt's Berg Balance Test will improve to at least 45/56 to demonstrate improved balance    Baseline  28/56:  05/08/18    Time  8    Period  Weeks    Status  Partially Met    Target Date  06/26/18      PT LONG TERM GOAL #4   Title  Pt will improve 29mT time to at least 0.80 m/s for improved safety with ambulation in the community    Baseline  0.52 m/s using crutches: .68 m/sec    Time  8    Period  Weeks    Status  Partially Met    Target Date  06/26/18      PT LONG TERM GOAL #5   Title  Pt's TUG will improve to equal to or less than 13 seconds to demonstrate improved balance, gait speed, and strength    Baseline  22.47 seconds  05/08/18  20.30    Time  8    Period  Weeks    Status  Partially Met    Target Date  06/26/18            Plan - 05/22/18 1024     Clinical Impression Statement  Dynamic and static balance interventions continued today, with a focus on unilateral LE stability. Pt tolerated all exercises well. Intermediate dynamic standing balance tasks were progressed with cga needed for reaching outside of her base of support. Pt would continue to benefit from skilled therapy services in order to address balance deficits in order to decrease fall risk and improve mobility.    Rehab Potential  Good    PT Frequency  2x / week    PT Duration  8 weeks    PT Treatment/Interventions  ADLs/Self Care Home Management;Aquatic Therapy;Biofeedback;Cryotherapy;Electrical Stimulation;Iontophoresis 432mml Dexamethasone;Moist Heat;Traction;Ultrasound;DME Instruction;Gait training;Stair training;Functional mobility training;Therapeutic activities;Therapeutic exercise;Neuromuscular re-education;Balance training;Patient/family education;Manual techniques;Orthotic Fit/Training;Dry needling;Passive range of motion;Compression bandaging;Energy conservation;Taping    PT Next Visit Plan  progress strengthening program and introduce balance interventions    PT Home Exercise Plan  seated marching, sit<>stand, standing hip Abd, standing hip E    Consulted and Agree with Plan of Care  Patient       Patient will benefit from skilled therapeutic intervention in order to improve the following deficits and impairments:  Abnormal gait, Decreased activity tolerance, Decreased balance, Decreased coordination, Decreased endurance, Decreased knowledge of use of DME,  Decreased range of motion, Decreased mobility, Decreased safety awareness, Decreased strength, Difficulty walking, Hypomobility, Increased edema, Increased fascial restricitons, Increased muscle spasms, Impaired perceived functional ability, Impaired flexibility, Impaired sensation, Improper body mechanics, Postural dysfunction, Pain  Visit Diagnosis: Unsteadiness on feet  Muscle weakness (generalized)  Other lack of  coordination  Difficulty in walking, not elsewhere classified     Problem List Patient Active Problem List   Diagnosis Date Noted  . History of bilateral hip replacements 05/28/2015  . Spinal stenosis, lumbar region, with neurogenic claudication 05/28/2015  . DDD (degenerative disc disease), lumbar 03/13/2015  . Lumbar post-laminectomy syndrome 03/13/2015  . Degenerative cervical disc 03/13/2015  . Status post bilateral total hip replacement 03/13/2015  . Sacroiliac joint dysfunction 03/13/2015  . History of surgery on upper extremity 03/13/2015  . DJD of shoulder 03/13/2015  . Rheumatoid arthritis ([place]) 02/17/2015    Alanson Puls, [place]  DPT 05/22/2018, 10:59 AM  [place] MAIN [facility] SERVICES [address] [place], [place], [address] Phone: [phone]   Fax:  [phone]  Name: Yvonne Singleton MRN: [id number] Date of Birth: [birth year]

## 2018-05-25 ENCOUNTER — Ambulatory Visit: Payer: Medicare Other

## 2018-05-25 DIAGNOSIS — R2681 Unsteadiness on feet: Secondary | ICD-10-CM

## 2018-05-25 DIAGNOSIS — M6281 Muscle weakness (generalized): Secondary | ICD-10-CM

## 2018-05-25 DIAGNOSIS — R278 Other lack of coordination: Secondary | ICD-10-CM

## 2018-05-25 DIAGNOSIS — R262 Difficulty in walking, not elsewhere classified: Secondary | ICD-10-CM

## 2018-05-25 NOTE — Therapy (Signed)
[place] MAIN [facility] SERVICES [address] [place], [place], [address] Phone: [phone]   Fax:  [phone]  Physical Therapy Treatment  Patient Details  Name: Yvonne Singleton MRN: [id number] Date of Birth: 08-01-1935 Referring Provider: Dr. Jennings Books   Encounter Date: 05/25/2018  PT End of Session - 05/25/18 0938    Visit Number  12    Number of Visits  13    Date for PT Re-Evaluation  06/26/18    Authorization Type  2/10 progress note    PT Start Time  0932    PT Stop Time  1015    PT Time Calculation (min)  43 min    Equipment Utilized During Treatment  Gait belt    Activity Tolerance  Patient tolerated treatment well;No increased pain    Behavior During Therapy  WFL for tasks assessed/performed       Past Medical History:  Diagnosis Date  . Back pain   . Breast cancer ([place]) 2007   right breast lumpectomy with rad tx  . Cancer [facility]) 2007   right breast  . Collagen vascular disease ([place])   . Gout   . Hard of hearing   . Hypertension   . Parkinson's disease ([place])   . Personal history of radiation therapy 2007   F/U right breast cancer    Past Surgical History:  Procedure Laterality Date  . ABDOMINAL HYSTERECTOMY    . BREAST LUMPECTOMY Right 2007   suspicious calcs, f/u with radiation  . CATARACT EXTRACTION Bilateral   . ESOPHAGOGASTRODUODENOSCOPY (EGD) WITH PROPOFOL N/A 05/25/2017   Procedure: ESOPHAGOGASTRODUODENOSCOPY (EGD) WITH PROPOFOL;  Surgeon: Lollie Sails, MD;  Location: [facility] ENDOSCOPY;  Service: Endoscopy;  Laterality: N/A;  . ESOPHAGOGASTRODUODENOSCOPY (EGD) WITH PROPOFOL N/A 05/09/2018   Procedure: ESOPHAGOGASTRODUODENOSCOPY (EGD) WITH PROPOFOL;  Surgeon: Lin Landsman, MD;  Location: [facility] ENDOSCOPY;  Service: Gastroenterology;  Laterality: N/A;  . EYE SURGERY Bilateral   . HAND SURGERY    . HIP CLOSED REDUCTION Left 06/24/2015   Procedure: CLOSED REDUCTION HIP;  Surgeon: Dereck Leep, MD;   Location: [facility] ORS;  Service: Orthopedics;  Laterality: Left;  . HIP CLOSED REDUCTION Left 02/09/2016   Procedure: CLOSED MANIPULATION HIP;  Surgeon: Earnestine Leys, MD;  Location: [facility] ORS;  Service: Orthopedics;  Laterality: Left;  . HIP SURGERY    . JOINT REPLACEMENT     bilateral hip  . OOPHORECTOMY    . TOTAL KNEE ARTHROPLASTY Bilateral     There were no vitals filed for this visit.  Subjective Assessment - 05/25/18 0937    Subjective  Patient reports some back pain. Extra shakiness today noted from Parkinsons. HEP been going well    Pertinent History  Pt arrived late to appointment, limiting evaluation. Pt presents with reports of imbalance and weakness.  Pt reports she stopped exercising this past winter so her balance has gotten worse.  Pt has been using Bil crutches at all times for 3-4 years.  Pt uses crutches instead of a walker due to chronic back and Bil wrist pain (R>L) due to gout and RA. Pt denies any falls in the past 6 months but has fallen in the past. Pt has had some issues with dizziness when taking Sinemet and Neurontin together so she has separated her schedule of taking these per Dr. Sharlet Salina. Pt reports she has trouble with mopping, vacuuming, sweeping, walking, outside work (flowers, etc).  Goals: to improve with all of these and to be  able to do more in the kitchen.  Pt with h/o Bil TKA and THAs, back surgery. Pt reports her R THA dislocated x3 and L THA dislocated x2 but last time this happened was 3 years ago.  Pt with h/o osteoporosis and RA.    Pt reports numbness and tingling and pain down LLE.  Pt additionally has numbness and tingling RLE but not as worse as on the L side.  These symptoms have been present for over a year and is being followed by Dr. Sharlet Salina for this.  Pt denies any bowel or bladder issues, saddle anesthesia, night sweats, sudden changes in weight.  Pt reports chronic pain in wrists, neck, back, Bil hip, BLEs.  Current pain in back: 4/10, best pain: 2/10,  worst pain: 6/10.     Limitations  Walking;House hold activities;Standing    How long can you sit comfortably?  no issues    How long can you stand comfortably?  10 minutes    How long can you walk comfortably?  15 minutes    Diagnostic tests  Pt had MRI lumbar spine 07/08/15: L5-S1 mild disc desiccation broad-based disc bulging extending to the R neural foramen, moderate to severe facet degenerative changes, moderate to severe R foraminal stenosis, L4-5 mild disc dessication broad-based disc bulging, moderate facet degenerative changes, moderate Bil lateral recess stenosis and mild to moderate central stenosis, mild Bil foraminal stenosis, at L3-4 there is severe degenerative disc disease, moderate to severe facet degenerative changes with mild central stenosis, mild Bil foraminal stenosis, at L2-3 there is severe degenerative disc disease, R hemilaminectomy defect, L1-2 mild disc dessication broad-based disc bulging. She has had periodic steroid injections with mild to moderate relief. Pt is followed at [facility] pain clinic for this. Plan is to schedule a L L5-S1 ESI if her pain worsens.    Patient Stated Goals  see above    Currently in Pain?  Yes    Pain Score  4     Pain Location  Back    Pain Orientation  Lower    Pain Descriptors / Indicators  Aching    Pain Type  Chronic pain    Pain Onset  More than a month ago    Pain Frequency  Constant       Therapeutic exercise:  Standing on 1/2 bolster (Flat side up): Heel/toe rock x15 with finger tip hold for balance with cues to keep knee straight for better ankle strategies;  Feet in neutral: reach for different sticky notes on mirror to tap for reaching inside and outside BOS: reach with L and R hand.    LAQ with 2 # BLE x 15 x 2 with 3 sec hold (reports easy so will increase weight next time)   Marching with 2 # x 15 x 1 BLE ; pain when marching with RLE  Hip extension standing with knee ext x 15 BLE  Hip abd sidelying left and right x  15 BLE  IT band rollout sidelying LLE. 3 minutes   Seated adduction squeezes 10x  Hooklying marching with TrA activation 15 x 2 Bridges x 10    Cues for core activation in standing to reduce back papin.                           PT Education - 05/25/18 [phone]    Education Details  HEP, exercise technique     Person(s) Educated  Patient  Methods  Explanation;Demonstration;Verbal cues    Comprehension  Verbalized understanding;Returned demonstration                 Plan - 05/25/18 0955    Clinical Impression Statement  Patient has increased shaking of UE's today due to Parkinson's exacerbation. Reports core stabilization with standing interventions relieves back pain allowing for prolonged standing. Dynamic interventions require CGA for stability due to limited ankle response. Pt would continue to benefit from skilled therapy services in order to address balance deficits in order to decrease fall risk and improve mobility    Rehab Potential  Good    PT Frequency  2x / week    PT Duration  8 weeks    PT Treatment/Interventions  ADLs/Self Care Home Management;Aquatic Therapy;Biofeedback;Cryotherapy;Electrical Stimulation;Iontophoresis 4mg /ml Dexamethasone;Moist Heat;Traction;Ultrasound;DME Instruction;Gait training;Stair training;Functional mobility training;Therapeutic activities;Therapeutic exercise;Neuromuscular re-education;Balance training;Patient/family education;Manual techniques;Orthotic Fit/Training;Dry needling;Passive range of motion;Compression bandaging;Energy conservation;Taping    PT Next Visit Plan  progress strengthening program and introduce balance interventions    PT Home Exercise Plan  seated marching, sit<>stand, standing hip Abd, standing hip E    Consulted and Agree with Plan of Care  Patient       Patient will benefit from skilled therapeutic intervention in order to improve the following deficits and impairments:  Abnormal gait,  Decreased activity tolerance, Decreased balance, Decreased coordination, Decreased endurance, Decreased knowledge of use of DME, Decreased range of motion, Decreased mobility, Decreased safety awareness, Decreased strength, Difficulty walking, Hypomobility, Increased edema, Increased fascial restricitons, Increased muscle spasms, Impaired perceived functional ability, Impaired flexibility, Impaired sensation, Improper body mechanics, Postural dysfunction, Pain  Visit Diagnosis: Unsteadiness on feet  Muscle weakness (generalized)  Other lack of coordination  Difficulty in walking, not elsewhere classified     Problem List Patient Active Problem List   Diagnosis Date Noted  . History of bilateral hip replacements 05/28/2015  . Spinal stenosis, lumbar region, with neurogenic claudication 05/28/2015  . DDD (degenerative disc disease), lumbar 03/13/2015  . Lumbar post-laminectomy syndrome 03/13/2015  . Degenerative cervical disc 03/13/2015  . Status post bilateral total hip replacement 03/13/2015  . Sacroiliac joint dysfunction 03/13/2015  . History of surgery on upper extremity 03/13/2015  . DJD of shoulder 03/13/2015  . Rheumatoid arthritis ([place]) 02/17/2015   Janna Arch, PT, DPT   05/25/2018, 10:15 AM  [place] MAIN [facility] SERVICES [address] [place], [place], [address] Phone: [phone]   Fax:  [phone]  Name: Yvonne Singleton MRN: [id number] Date of Birth: 10-21-1934

## 2018-05-29 ENCOUNTER — Encounter: Payer: Self-pay | Admitting: Physical Therapy

## 2018-05-29 ENCOUNTER — Ambulatory Visit: Payer: Medicare Other | Admitting: Physical Therapy

## 2018-05-29 DIAGNOSIS — R278 Other lack of coordination: Secondary | ICD-10-CM

## 2018-05-29 DIAGNOSIS — M6281 Muscle weakness (generalized): Secondary | ICD-10-CM

## 2018-05-29 DIAGNOSIS — R2681 Unsteadiness on feet: Secondary | ICD-10-CM | POA: Diagnosis not present

## 2018-05-29 DIAGNOSIS — R262 Difficulty in walking, not elsewhere classified: Secondary | ICD-10-CM

## 2018-05-29 NOTE — Therapy (Addendum)
[place] MAIN [facility] SERVICES [address] [place], [place], [address] Phone: [phone]   Fax:  [phone]  Physical Therapy Treatment  Patient Details  Name: Yvonne Singleton MRN: [id number] Date of Birth: [birth year] Referring Provider: Dr. Jennings Books   Encounter Date: 05/29/2018  PT End of Session - 05/29/18 0850    Visit Number  13    Number of Visits  29    Date for PT Re-Evaluation  06/26/18    Authorization Type  3/10 progress note    PT Start Time  0847    PT Stop Time  0930    PT Time Calculation (min)  43 min    Equipment Utilized During Treatment  Gait belt    Activity Tolerance  Patient tolerated treatment well;No increased pain    Behavior During Therapy  WFL for tasks assessed/performed       Past Medical History:  Diagnosis Date  . Back pain   . Breast cancer ([place]) 2007   right breast lumpectomy with rad tx  . Cancer [facility]) 2007   right breast  . Collagen vascular disease ([place])   . Gout   . Hard of hearing   . Hypertension   . Parkinson's disease ([place])   . Personal history of radiation therapy 2007   F/U right breast cancer    Past Surgical History:  Procedure Laterality Date  . ABDOMINAL HYSTERECTOMY    . BREAST LUMPECTOMY Right 2007   suspicious calcs, f/u with radiation  . CATARACT EXTRACTION Bilateral   . ESOPHAGOGASTRODUODENOSCOPY (EGD) WITH PROPOFOL N/A 05/25/2017   Procedure: ESOPHAGOGASTRODUODENOSCOPY (EGD) WITH PROPOFOL;  Surgeon: Lollie Sails, MD;  Location: [facility] ENDOSCOPY;  Service: Endoscopy;  Laterality: N/A;  . ESOPHAGOGASTRODUODENOSCOPY (EGD) WITH PROPOFOL N/A 05/09/2018   Procedure: ESOPHAGOGASTRODUODENOSCOPY (EGD) WITH PROPOFOL;  Surgeon: Lin Landsman, MD;  Location: [facility] ENDOSCOPY;  Service: Gastroenterology;  Laterality: N/A;  . EYE SURGERY Bilateral   . HAND SURGERY    . HIP CLOSED REDUCTION Left 06/24/2015   Procedure: CLOSED REDUCTION HIP;  Surgeon: Dereck Leep, MD;   Location: [facility] ORS;  Service: Orthopedics;  Laterality: Left;  . HIP CLOSED REDUCTION Left 02/09/2016   Procedure: CLOSED MANIPULATION HIP;  Surgeon: Earnestine Leys, MD;  Location: [facility] ORS;  Service: Orthopedics;  Laterality: Left;  . HIP SURGERY    . JOINT REPLACEMENT     bilateral hip  . OOPHORECTOMY    . TOTAL KNEE ARTHROPLASTY Bilateral     There were no vitals filed for this visit.  Subjective Assessment - 05/29/18 0850    Subjective  Patient reports some back pain. Extra shakiness today noted from Parkinsons. HEP been going well    Pertinent History  Pt arrived late to appointment, limiting evaluation. Pt presents with reports of imbalance and weakness.  Pt reports she stopped exercising this past winter so her balance has gotten worse.  Pt has been using Bil crutches at all times for 3-4 years.  Pt uses crutches instead of a walker due to chronic back and Bil wrist pain (R>L) due to gout and RA. Pt denies any falls in the past 6 months but has fallen in the past. Pt has had some issues with dizziness when taking Sinemet and Neurontin together so she has separated her schedule of taking these per Dr. Sharlet Salina. Pt reports she has trouble with mopping, vacuuming, sweeping, walking, outside work (flowers, etc).  Goals: to improve with all of these and to be  able to do more in the kitchen.  Pt with h/o Bil TKA and THAs, back surgery. Pt reports her R THA dislocated x3 and L THA dislocated x2 but last time this happened was 3 years ago.  Pt with h/o osteoporosis and RA.    Pt reports numbness and tingling and pain down LLE.  Pt additionally has numbness and tingling RLE but not as worse as on the L side.  These symptoms have been present for over a year and is being followed by Dr. Sharlet Salina for this.  Pt denies any bowel or bladder issues, saddle anesthesia, night sweats, sudden changes in weight.  Pt reports chronic pain in wrists, neck, back, Bil hip, BLEs.  Current pain in back: 4/10, best pain: 2/10,  worst pain: 6/10.     Limitations  Walking;House hold activities;Standing    How long can you sit comfortably?  no issues    How long can you stand comfortably?  10 minutes    How long can you walk comfortably?  15 minutes    Diagnostic tests  Pt had MRI lumbar spine 07/08/15: L5-S1 mild disc desiccation broad-based disc bulging extending to the R neural foramen, moderate to severe facet degenerative changes, moderate to severe R foraminal stenosis, L4-5 mild disc dessication broad-based disc bulging, moderate facet degenerative changes, moderate Bil lateral recess stenosis and mild to moderate central stenosis, mild Bil foraminal stenosis, at L3-4 there is severe degenerative disc disease, moderate to severe facet degenerative changes with mild central stenosis, mild Bil foraminal stenosis, at L2-3 there is severe degenerative disc disease, R hemilaminectomy defect, L1-2 mild disc dessication broad-based disc bulging. She has had periodic steroid injections with mild to moderate relief. Pt is followed at [facility] pain clinic for this. Plan is to schedule a L L5-S1 ESI if her pain worsens.    Patient Stated Goals  see above    Currently in Pain?  Yes    Pain Score  4     Pain Location  Back    Pain Orientation  Lower    Pain Descriptors / Indicators  Aching    Pain Onset  More than a month ago    Multiple Pain Sites  No       Therapeutic exercise:  Standing on 1/2 bolster (Flat side up): Heel/toe rock x15 with finger tip hold for balance with cues to keep knee straight for better ankle strategies;  Stepping over hurdle fwd/ bwd , side to side x 10   Feet in neutral: with head turns and reaching out of base of support   LAQ with 2 # BLE x 15 x 2 with 3 sec hold (reports easy so will increase weight next time)   Marching with 2 # x 15 x 1 BLE; pain when marching with RLE  Hip extension standing with kneeextx 15 BLE  Hip abd sidelying left and right x 15 BLE  Hooklying abd/ER x 20  with OTB   Hooklying marching with TrA activation 15 x 2  Bridges x 10   Cues for core activation of TA in standing to reduce back pain.                        PT Education - 05/29/18 0850    Education Details  HEP    Person(s) Educated  Patient    Methods  Explanation;Demonstration;Tactile cues;Verbal cues    Comprehension  Verbalized understanding;Returned demonstration  PT Short Term Goals - 03/20/18 1129      PT SHORT TERM GOAL #1   Title  Pt will independently complete HEP at least 4 days/wk for improved carryover between sessions    Time  2    Period  Weeks    Status  New        PT Long Term Goals - 05/08/18 0254      PT LONG TERM GOAL #1   Title  Pt's ABC scale will improve to at least 55% to demonstrate improvement in pt's perceived balance    Baseline  35%    Time  8    Period  Weeks    Status  Partially Met    Target Date  06/26/18      PT LONG TERM GOAL #2   Title  Pt's 5xSTS will improve to equal to or less than 15 seconds to demonstrates improved balance and strength    Baseline  26.47 seconds (pt pushing on thighs to stand)  05/08/18  23.20 sec    Time  8    Period  Weeks    Status  Partially Met    Target Date  06/26/18      PT LONG TERM GOAL #3   Title  Pt's Berg Balance Test will improve to at least 45/56 to demonstrate improved balance    Baseline  28/56:  05/08/18    Time  8    Period  Weeks    Status  Partially Met    Target Date  06/26/18      PT LONG TERM GOAL #4   Title  Pt will improve 74mT time to at least 0.80 m/s for improved safety with ambulation in the community    Baseline  0.52 m/s using crutches: .68 m/sec    Time  8    Period  Weeks    Status  Partially Met    Target Date  06/26/18      PT LONG TERM GOAL #5   Title  Pt's TUG will improve to equal to or less than 13 seconds to demonstrate improved balance, gait speed, and strength    Baseline  22.47 seconds  05/08/18  20.30    Time  8     Period  Weeks    Status  Partially Met    Target Date  06/26/18            Plan - 05/29/18 0852    Clinical Impression Statement  Pt was able to progress dynamic balance exercises today, noting improved postural reactions with LOB and moving outside normal BOS.  Pt was able to perform all exercises on uneven surfaces with minimal LOB noted.  Dynamic balance stability activities were progressed today, with decrease control noted when attempting to perform tasks with single leg activities.  Pt would continue to benefit from skilled therapy services to address further balance impairments and decrease falls risk.    Rehab Potential  Good    PT Frequency  2x / week    PT Duration  8 weeks    PT Treatment/Interventions  ADLs/Self Care Home Management;Aquatic Therapy;Biofeedback;Cryotherapy;Electrical Stimulation;Iontophoresis 443mml Dexamethasone;Moist Heat;Traction;Ultrasound;DME Instruction;Gait training;Stair training;Functional mobility training;Therapeutic activities;Therapeutic exercise;Neuromuscular re-education;Balance training;Patient/family education;Manual techniques;Orthotic Fit/Training;Dry needling;Passive range of motion;Compression bandaging;Energy conservation;Taping    PT Next Visit Plan  progress strengthening program and introduce balance interventions    PT Home Exercise Plan  seated marching, sit<>stand, standing hip Abd, standing hip E    Consulted and Agree  with Plan of Care  Patient       Patient will benefit from skilled therapeutic intervention in order to improve the following deficits and impairments:  Abnormal gait, Decreased activity tolerance, Decreased balance, Decreased coordination, Decreased endurance, Decreased knowledge of use of DME, Decreased range of motion, Decreased mobility, Decreased safety awareness, Decreased strength, Difficulty walking, Hypomobility, Increased edema, Increased fascial restricitons, Increased muscle spasms, Impaired perceived  functional ability, Impaired flexibility, Impaired sensation, Improper body mechanics, Postural dysfunction, Pain  Visit Diagnosis: Unsteadiness on feet  Muscle weakness (generalized)  Other lack of coordination  Difficulty in walking, not elsewhere classified     Problem List Patient Active Problem List   Diagnosis Date Noted  . History of bilateral hip replacements 05/28/2015  . Spinal stenosis, lumbar region, with neurogenic claudication 05/28/2015  . DDD (degenerative disc disease), lumbar 03/13/2015  . Lumbar post-laminectomy syndrome 03/13/2015  . Degenerative cervical disc 03/13/2015  . Status post bilateral total hip replacement 03/13/2015  . Sacroiliac joint dysfunction 03/13/2015  . History of surgery on upper extremity 03/13/2015  . DJD of shoulder 03/13/2015  . Rheumatoid arthritis ([place]) 02/17/2015    Arelia Sneddon S,PT DPT 05/29/2018, 8:54 AM  [place] MAIN [facility] SERVICES [address] [place], [place], [address] Phone: [phone]   Fax:  [phone]  Name: Yvonne Singleton MRN: [id number] Date of Birth: 1935-02-16

## 2018-05-31 ENCOUNTER — Ambulatory Visit: Payer: Medicare Other

## 2018-05-31 VITALS — BP 130/50 | HR 70

## 2018-05-31 DIAGNOSIS — M6281 Muscle weakness (generalized): Secondary | ICD-10-CM

## 2018-05-31 DIAGNOSIS — R2681 Unsteadiness on feet: Secondary | ICD-10-CM | POA: Diagnosis not present

## 2018-05-31 NOTE — Therapy (Signed)
[place] MAIN [facility] SERVICES [address] [place], [place], [address] Phone: [phone]   Fax:  [phone]  Physical Therapy Treatment  Patient Details  Name: Yvonne Singleton MRN: [id number] Date of Birth: Oct 03, 1935 Referring Provider: Dr. Jennings Books   Encounter Date: 05/31/2018  PT End of Session - 05/31/18 0900    Visit Number  14    Number of Visits  29    Date for PT Re-Evaluation  06/26/18    Authorization Type  4/10 progress note    PT Start Time  0855    PT Stop Time  0930    PT Time Calculation (min)  35 min    Equipment Utilized During Treatment  Gait belt    Activity Tolerance  Patient tolerated treatment well;No increased pain    Behavior During Therapy  WFL for tasks assessed/performed       Past Medical History:  Diagnosis Date  . Back pain   . Breast cancer ([place]) 2007   right breast lumpectomy with rad tx  . Cancer [facility]) 2007   right breast  . Collagen vascular disease ([place])   . Gout   . Hard of hearing   . Hypertension   . Parkinson's disease ([place])   . Personal history of radiation therapy 2007   F/U right breast cancer    Past Surgical History:  Procedure Laterality Date  . ABDOMINAL HYSTERECTOMY    . BREAST LUMPECTOMY Right 2007   suspicious calcs, f/u with radiation  . CATARACT EXTRACTION Bilateral   . ESOPHAGOGASTRODUODENOSCOPY (EGD) WITH PROPOFOL N/A 05/25/2017   Procedure: ESOPHAGOGASTRODUODENOSCOPY (EGD) WITH PROPOFOL;  Surgeon: Lollie Sails, MD;  Location: [facility] ENDOSCOPY;  Service: Endoscopy;  Laterality: N/A;  . ESOPHAGOGASTRODUODENOSCOPY (EGD) WITH PROPOFOL N/A 05/09/2018   Procedure: ESOPHAGOGASTRODUODENOSCOPY (EGD) WITH PROPOFOL;  Surgeon: Lin Landsman, MD;  Location: [facility] ENDOSCOPY;  Service: Gastroenterology;  Laterality: N/A;  . EYE SURGERY Bilateral   . HAND SURGERY    . HIP CLOSED REDUCTION Left 06/24/2015   Procedure: CLOSED REDUCTION HIP;  Surgeon: Dereck Leep, MD;   Location: [facility] ORS;  Service: Orthopedics;  Laterality: Left;  . HIP CLOSED REDUCTION Left 02/09/2016   Procedure: CLOSED MANIPULATION HIP;  Surgeon: Earnestine Leys, MD;  Location: [facility] ORS;  Service: Orthopedics;  Laterality: Left;  . HIP SURGERY    . JOINT REPLACEMENT     bilateral hip  . OOPHORECTOMY    . TOTAL KNEE ARTHROPLASTY Bilateral     Vitals:   05/31/18 0850  BP: (!) 130/50  Pulse: 70  SpO2: 98%    Subjective Assessment - 05/31/18 0850    Subjective  Patient reports some back pain but otherwise no specific questions or concerns. HEP is still going well.     Pertinent History  Pt arrived late to appointment, limiting evaluation. Pt presents with reports of imbalance and weakness.  Pt reports she stopped exercising this past winter so her balance has gotten worse.  Pt has been using Bil crutches at all times for 3-4 years.  Pt uses crutches instead of a walker due to chronic back and Bil wrist pain (R>L) due to gout and RA. Pt denies any falls in the past 6 months but has fallen in the past. Pt has had some issues with dizziness when taking Sinemet and Neurontin together so she has separated her schedule of taking these per Dr. Sharlet Salina. Pt reports she has trouble with mopping, vacuuming, sweeping, walking, outside work (flowers,  etc).  Goals: to improve with all of these and to be able to do more in the kitchen.  Pt with h/o Bil TKA and THAs, back surgery. Pt reports her R THA dislocated x3 and L THA dislocated x2 but last time this happened was 3 years ago.  Pt with h/o osteoporosis and RA.    Pt reports numbness and tingling and pain down LLE.  Pt additionally has numbness and tingling RLE but not as worse as on the L side.  These symptoms have been present for over a year and is being followed by Dr. Sharlet Salina for this.  Pt denies any bowel or bladder issues, saddle anesthesia, night sweats, sudden changes in weight.  Pt reports chronic pain in wrists, neck, back, Bil hip, BLEs.  Current  pain in back: 4/10, best pain: 2/10, worst pain: 6/10.     Limitations  Walking;House hold activities;Standing    How long can you sit comfortably?  no issues    How long can you stand comfortably?  10 minutes    How long can you walk comfortably?  15 minutes    Diagnostic tests  Pt had MRI lumbar spine 07/08/15: L5-S1 mild disc desiccation broad-based disc bulging extending to the R neural foramen, moderate to severe facet degenerative changes, moderate to severe R foraminal stenosis, L4-5 mild disc dessication broad-based disc bulging, moderate facet degenerative changes, moderate Bil lateral recess stenosis and mild to moderate central stenosis, mild Bil foraminal stenosis, at L3-4 there is severe degenerative disc disease, moderate to severe facet degenerative changes with mild central stenosis, mild Bil foraminal stenosis, at L2-3 there is severe degenerative disc disease, R hemilaminectomy defect, L1-2 mild disc dessication broad-based disc bulging. She has had periodic steroid injections with mild to moderate relief. Pt is followed at [facility] pain clinic for this. Plan is to schedule a L L5-S1 ESI if her pain worsens.    Patient Stated Goals  see above    Currently in Pain?  Yes    Pain Score  4     Pain Location  Back    Pain Orientation  Lower    Pain Descriptors / Indicators  Aching    Pain Type  Chronic pain    Pain Onset  More than a month ago    Pain Frequency  Constant        TREATMENT   Ther-ex  NuStep L2 x 5 minutes during history (3 minutes unbilled); Standing marching with 2# ankle weights x 15; Standing hip extension with 2# ankle weights x 15; Standing hip abduction with 2# ankle weights x 15;  Standing HS curls 2# ankle weights x 15;  Neuromuscular Re-education  Airex balance with toe taps to 6" step alternating LE x 10 each, faded UE support, pt able to perform with 2 finger support at the end of the set; Static balance on 1/2 bolster (flat side up) with no UE support  30s x 2; Standing on 1/2 bolster (Flat side up) heel/toe rock x15 with no UE hold for balance but intermittent assist from therapist to prevent falls especially with heel rocking and posterior LOB;                         PT Education - 05/31/18 0900    Education Details  exercise form/technique    Person(s) Educated  Patient    Methods  Explanation    Comprehension  Verbalized understanding  PT Short Term Goals - 03/20/18 1129      PT SHORT TERM GOAL #1   Title  Pt will independently complete HEP at least 4 days/wk for improved carryover between sessions    Time  2    Period  Weeks    Status  New        PT Long Term Goals - 05/08/18 9937      PT LONG TERM GOAL #1   Title  Pt's ABC scale will improve to at least 55% to demonstrate improvement in pt's perceived balance    Baseline  35%    Time  8    Period  Weeks    Status  Partially Met    Target Date  06/26/18      PT LONG TERM GOAL #2   Title  Pt's 5xSTS will improve to equal to or less than 15 seconds to demonstrates improved balance and strength    Baseline  26.47 seconds (pt pushing on thighs to stand)  05/08/18  23.20 sec    Time  8    Period  Weeks    Status  Partially Met    Target Date  06/26/18      PT LONG TERM GOAL #3   Title  Pt's Berg Balance Test will improve to at least 45/56 to demonstrate improved balance    Baseline  28/56:  05/08/18    Time  8    Period  Weeks    Status  Partially Met    Target Date  06/26/18      PT LONG TERM GOAL #4   Title  Pt will improve 51mT time to at least 0.80 m/s for improved safety with ambulation in the community    Baseline  0.52 m/s using crutches: .68 m/sec    Time  8    Period  Weeks    Status  Partially Met    Target Date  06/26/18      PT LONG TERM GOAL #5   Title  Pt's TUG will improve to equal to or less than 13 seconds to demonstrate improved balance, gait speed, and strength    Baseline  22.47 seconds  05/08/18  20.30    Time   8    Period  Weeks    Status  Partially Met    Target Date  06/26/18            Plan - 05/31/18 0901    Clinical Impression Statement  Pt arrived late so her schedule had to be shortened due to conflicting patient appointments. Pt reports she is going to her MD today to assess her R ankle swelling. Pt does have notable swelling in her right ankle which she reports has been present for approximately 1 week. No pain upon palpation to ankle. Pt with fatigue during session and requires intermittent seated rest breaks. She is able to complete all exercises as instructed with notable weakness in R hip abduction. Pt would continue to benefit from skilled therapy services to address further balance impairments and decrease falls risk.    Rehab Potential  Good    PT Frequency  2x / week    PT Duration  8 weeks    PT Treatment/Interventions  ADLs/Self Care Home Management;Aquatic Therapy;Biofeedback;Cryotherapy;Electrical Stimulation;Iontophoresis 43mml Dexamethasone;Moist Heat;Traction;Ultrasound;DME Instruction;Gait training;Stair training;Functional mobility training;Therapeutic activities;Therapeutic exercise;Neuromuscular re-education;Balance training;Patient/family education;Manual techniques;Orthotic Fit/Training;Dry needling;Passive range of motion;Compression bandaging;Energy conservation;Taping    PT Next Visit Plan  progress strengthening program and balance  interventions    PT Home Exercise Plan  seated marching, sit<>stand, standing hip Abd, standing hip E    Consulted and Agree with Plan of Care  Patient       Patient will benefit from skilled therapeutic intervention in order to improve the following deficits and impairments:  Abnormal gait, Decreased activity tolerance, Decreased balance, Decreased coordination, Decreased endurance, Decreased knowledge of use of DME, Decreased range of motion, Decreased mobility, Decreased safety awareness, Decreased strength, Difficulty walking,  Hypomobility, Increased edema, Increased fascial restricitons, Increased muscle spasms, Impaired perceived functional ability, Impaired flexibility, Impaired sensation, Improper body mechanics, Postural dysfunction, Pain  Visit Diagnosis: Unsteadiness on feet  Muscle weakness (generalized)     Problem List Patient Active Problem List   Diagnosis Date Noted  . History of bilateral hip replacements 05/28/2015  . Spinal stenosis, lumbar region, with neurogenic claudication 05/28/2015  . DDD (degenerative disc disease), lumbar 03/13/2015  . Lumbar post-laminectomy syndrome 03/13/2015  . Degenerative cervical disc 03/13/2015  . Status post bilateral total hip replacement 03/13/2015  . Sacroiliac joint dysfunction 03/13/2015  . History of surgery on upper extremity 03/13/2015  . DJD of shoulder 03/13/2015  . Rheumatoid arthritis ([place]) 02/17/2015   Phillips Grout PT, DPT, GCS  Huprich,Jason 05/31/2018, 11:21 AM  [place] MAIN [facility] SERVICES [address] [place], [place], [address] Phone: [phone]   Fax:  [phone]  Name: Zyion Leidner MRN: [id number] Date of Birth: 1935/08/26

## 2018-06-05 ENCOUNTER — Ambulatory Visit: Payer: Medicare Other | Admitting: Physical Therapy

## 2018-06-05 ENCOUNTER — Encounter: Payer: Self-pay | Admitting: Physical Therapy

## 2018-06-05 DIAGNOSIS — R262 Difficulty in walking, not elsewhere classified: Secondary | ICD-10-CM

## 2018-06-05 DIAGNOSIS — M6281 Muscle weakness (generalized): Secondary | ICD-10-CM

## 2018-06-05 DIAGNOSIS — R2681 Unsteadiness on feet: Secondary | ICD-10-CM

## 2018-06-05 DIAGNOSIS — R278 Other lack of coordination: Secondary | ICD-10-CM

## 2018-06-05 NOTE — Therapy (Signed)
[place] MAIN [facility] SERVICES [address] [place], [place], [address] Phone: [phone]   Fax:  [phone]  Physical Therapy Treatment  Patient Details  Name: Yvonne Singleton MRN: [id number] Date of Birth: February 12, 1935 Referring Provider: Dr. Jennings Books   Encounter Date: 06/05/2018  PT End of Session - 06/05/18 1311    Visit Number  15    Number of Visits  29    Date for PT Re-Evaluation  06/26/18    Authorization Type  5/10 progress note    PT Start Time  0105    PT Stop Time  0145    PT Time Calculation (min)  40 min    Equipment Utilized During Treatment  Gait belt    Activity Tolerance  Patient tolerated treatment well;No increased pain    Behavior During Therapy  WFL for tasks assessed/performed       Past Medical History:  Diagnosis Date  . Back pain   . Breast cancer ([place]) 2007   right breast lumpectomy with rad tx  . [facility]   right breast  . Collagen vascular disease ([place])   . Gout   . Hard of hearing   . Hypertension   . Parkinson's disease ([place])   . Personal history of radiation therapy 2007   F/U right breast cancer    Past Surgical History:  Procedure Laterality Date  . ABDOMINAL HYSTERECTOMY    . BREAST LUMPECTOMY Right 2007   suspicious calcs, f/u with radiation  . CATARACT EXTRACTION Bilateral   . ESOPHAGOGASTRODUODENOSCOPY (EGD) WITH PROPOFOL N/A 05/25/2017   Procedure: ESOPHAGOGASTRODUODENOSCOPY (EGD) WITH PROPOFOL;  Surgeon: Lollie Sails, MD;  Location: [facility] ENDOSCOPY;  Service: Endoscopy;  Laterality: N/A;  . ESOPHAGOGASTRODUODENOSCOPY (EGD) WITH PROPOFOL N/A 05/09/2018   Procedure: ESOPHAGOGASTRODUODENOSCOPY (EGD) WITH PROPOFOL;  Surgeon: Lin Landsman, MD;  Location: [facility] ENDOSCOPY;  Service: Gastroenterology;  Laterality: N/A;  . EYE SURGERY Bilateral   . HAND SURGERY    . HIP CLOSED REDUCTION Left 06/24/2015   Procedure: CLOSED REDUCTION HIP;  Surgeon: Dereck Leep, MD;   Location: [facility] ORS;  Service: Orthopedics;  Laterality: Left;  . HIP CLOSED REDUCTION Left 02/09/2016   Procedure: CLOSED MANIPULATION HIP;  Surgeon: Earnestine Leys, MD;  Location: [facility] ORS;  Service: Orthopedics;  Laterality: Left;  . HIP SURGERY    . JOINT REPLACEMENT     bilateral hip  . OOPHORECTOMY    . TOTAL KNEE ARTHROPLASTY Bilateral     There were no vitals filed for this visit.  Subjective Assessment - 06/05/18 1310    Subjective  Patient reports some back pain but otherwise no specific questions or concerns. HEP is still going well.     Pertinent History  Pt arrived late to appointment, limiting evaluation. Pt presents with reports of imbalance and weakness.  Pt reports she stopped exercising this past winter so her balance has gotten worse.  Pt has been using Bil crutches at all times for 3-4 years.  Pt uses crutches instead of a walker due to chronic back and Bil wrist pain (R>L) due to gout and RA. Pt denies any falls in the past 6 months but has fallen in the past. Pt has had some issues with dizziness when taking Sinemet and Neurontin together so she has separated her schedule of taking these per Dr. Sharlet Salina. Pt reports she has trouble with mopping, vacuuming, sweeping, walking, outside work (flowers, etc).  Goals: to improve with all of these  and to be able to do more in the kitchen.  Pt with h/o Bil TKA and THAs, back surgery. Pt reports her R THA dislocated x3 and L THA dislocated x2 but last time this happened was 3 years ago.  Pt with h/o osteoporosis and RA.    Pt reports numbness and tingling and pain down LLE.  Pt additionally has numbness and tingling RLE but not as worse as on the L side.  These symptoms have been present for over a year and is being followed by Dr. Sharlet Salina for this.  Pt denies any bowel or bladder issues, saddle anesthesia, night sweats, sudden changes in weight.  Pt reports chronic pain in wrists, neck, back, Bil hip, BLEs.  Current pain in back: 4/10, best  pain: 2/10, worst pain: 6/10.     Limitations  Walking;House hold activities;Standing    How long can you sit comfortably?  no issues    How long can you stand comfortably?  10 minutes    How long can you walk comfortably?  15 minutes    Diagnostic tests  Pt had MRI lumbar spine 07/08/15: L5-S1 mild disc desiccation broad-based disc bulging extending to the R neural foramen, moderate to severe facet degenerative changes, moderate to severe R foraminal stenosis, L4-5 mild disc dessication broad-based disc bulging, moderate facet degenerative changes, moderate Bil lateral recess stenosis and mild to moderate central stenosis, mild Bil foraminal stenosis, at L3-4 there is severe degenerative disc disease, moderate to severe facet degenerative changes with mild central stenosis, mild Bil foraminal stenosis, at L2-3 there is severe degenerative disc disease, R hemilaminectomy defect, L1-2 mild disc dessication broad-based disc bulging. She has had periodic steroid injections with mild to moderate relief. Pt is followed at [facility] pain clinic for this. Plan is to schedule a L L5-S1 ESI if her pain worsens.    Patient Stated Goals  see above    Currently in Pain?  Yes    Pain Score  2     Pain Location  Back    Pain Orientation  Lower    Pain Descriptors / Indicators  Aching    Pain Radiating Towards  NA    Pain Onset  More than a month ago    Pain Frequency  Constant    Aggravating Factors   standing    Pain Relieving Factors  sitting    Effect of Pain on Daily Activities  slows her down    Multiple Pain Sites  No        Therapeutic exercise and neuromuscular training: Nu-step x 5 mins Leg press 90 lbs x 20 x 2 Standing with hip abd x 10 x 2 BLE, difficulty due to weakness Standing with hip ext x 10 x 2 BLE Standing on blue foam and head turns x 2 mins feet apart, with UE support Standing on blue foam and head turns x 2 mins together apart, with UE support Stepping overhurdleleft and right and  fwd/bwd, x 5 reps needs UE support with LOB backwards 1/2 foam flat side up and balance with head turns left and right feet apart and feet together,cues for better posture Side stepping on blue balance beam left and right x 5 lengths, cues for going slowly and she needs UE support with LOB backwards supine SLR x 10 with intervals hooklying marching x 20 x 2 hooklying hip ER/abd x 20 x 2     CGA and  mod verbal cues used throughout with increased in postural  sway and LOB most seen with narrow base of support and while on uneven surfaces. Patient needs UE support during 50 % of  balance activities and has back pain and fatigue and needs a seated rest period.                        PT Education - 06/05/18 1311    Education Details  HEP    Person(s) Educated  Patient    Methods  Explanation;Demonstration;Tactile cues    Comprehension  Verbalized understanding       PT Short Term Goals - 03/20/18 1129      PT SHORT TERM GOAL #1   Title  Pt will independently complete HEP at least 4 days/wk for improved carryover between sessions    Time  2    Period  Weeks    Status  New        PT Long Term Goals - 05/08/18 2229      PT LONG TERM GOAL #1   Title  Pt's ABC scale will improve to at least 55% to demonstrate improvement in pt's perceived balance    Baseline  35%    Time  8    Period  Weeks    Status  Partially Met    Target Date  06/26/18      PT LONG TERM GOAL #2   Title  Pt's 5xSTS will improve to equal to or less than 15 seconds to demonstrates improved balance and strength    Baseline  26.47 seconds (pt pushing on thighs to stand)  05/08/18  23.20 sec    Time  8    Period  Weeks    Status  Partially Met    Target Date  06/26/18      PT LONG TERM GOAL #3   Title  Pt's Berg Balance Test will improve to at least 45/56 to demonstrate improved balance    Baseline  28/56:  05/08/18    Time  8    Period  Weeks    Status  Partially Met    Target  Date  06/26/18      PT LONG TERM GOAL #4   Title  Pt will improve 43mT time to at least 0.80 m/s for improved safety with ambulation in the community    Baseline  0.52 m/s using crutches: .68 m/sec    Time  8    Period  Weeks    Status  Partially Met    Target Date  06/26/18      PT LONG TERM GOAL #5   Title  Pt's TUG will improve to equal to or less than 13 seconds to demonstrate improved balance, gait speed, and strength    Baseline  22.47 seconds  05/08/18  20.30    Time  8    Period  Weeks    Status  Partially Met    Target Date  06/26/18            Plan - 06/05/18 1313    Clinical Impression Statement  Patient demonstrates LE and core weakness, and impaired mobility and lack of LE strength that is causing difficulty with gait activities.  Patient still fatigues quickly to due  weak quad and core muscles. Patient is able to perform exercises that were difficult several weeks ago. Patient will continue to benefit from skilled PT to improve with gait and mobility.     Rehab Potential  Good  PT Frequency  2x / week    PT Duration  8 weeks    PT Treatment/Interventions  ADLs/Self Care Home Management;Aquatic Therapy;Biofeedback;Cryotherapy;Electrical Stimulation;Iontophoresis 68m/ml Dexamethasone;Moist Heat;Traction;Ultrasound;DME Instruction;Gait training;Stair training;Functional mobility training;Therapeutic activities;Therapeutic exercise;Neuromuscular re-education;Balance training;Patient/family education;Manual techniques;Orthotic Fit/Training;Dry needling;Passive range of motion;Compression bandaging;Energy conservation;Taping    PT Next Visit Plan  progress strengthening program and balance interventions    PT Home Exercise Plan  seated marching, sit<>stand, standing hip Abd, standing hip E    Consulted and Agree with Plan of Care  Patient       Patient will benefit from skilled therapeutic intervention in order to improve the following deficits and impairments:   Abnormal gait, Decreased activity tolerance, Decreased balance, Decreased coordination, Decreased endurance, Decreased knowledge of use of DME, Decreased range of motion, Decreased mobility, Decreased safety awareness, Decreased strength, Difficulty walking, Hypomobility, Increased edema, Increased fascial restricitons, Increased muscle spasms, Impaired perceived functional ability, Impaired flexibility, Impaired sensation, Improper body mechanics, Postural dysfunction, Pain  Visit Diagnosis: Unsteadiness on feet  Muscle weakness (generalized)  Other lack of coordination  Difficulty in walking, not elsewhere classified     Problem List Patient Active Problem List   Diagnosis Date Noted  . History of bilateral hip replacements 05/28/2015  . Spinal stenosis, lumbar region, with neurogenic claudication 05/28/2015  . DDD (degenerative disc disease), lumbar 03/13/2015  . Lumbar post-laminectomy syndrome 03/13/2015  . Degenerative cervical disc 03/13/2015  . Status post bilateral total hip replacement 03/13/2015  . Sacroiliac joint dysfunction 03/13/2015  . History of surgery on upper extremity 03/13/2015  . DJD of shoulder 03/13/2015  . [place]oid [place] ([place] 02/17/2015    MAlanson Puls PVirginiaDPT 06/05/2018, 1:18 PM  [place] [facility] [address] [address]Phone: [phone]  Fax:  [phone] Name: Yvonne TellefsenMRN: [id number] of Birth: [birth year]

## 2018-06-07 ENCOUNTER — Encounter: Payer: Self-pay | Admitting: Physical Therapy

## 2018-06-07 ENCOUNTER — Ambulatory Visit: Payer: Medicare Other | Admitting: Physical Therapy

## 2018-06-07 DIAGNOSIS — M6281 Muscle weakness (generalized): Secondary | ICD-10-CM

## 2018-06-07 DIAGNOSIS — R2681 Unsteadiness on feet: Secondary | ICD-10-CM

## 2018-06-07 DIAGNOSIS — R278 Other lack of coordination: Secondary | ICD-10-CM

## 2018-06-07 DIAGNOSIS — R262 Difficulty in walking, not elsewhere classified: Secondary | ICD-10-CM

## 2018-06-07 NOTE — Therapy (Signed)
[place] MAIN [facility] SERVICES [address] [place], [place], [address] Phone: [phone]   Fax:  [phone]  Physical Therapy Treatment  Physical Therapy Progress Note   Dates of reporting period  05/08/18  to   06/07/18  Patient Details  Name: Yvonne Singleton MRN: [id number] Date of Birth: April 10, 1935 Referring Provider: Dr. Jennings Books   Encounter Date: 06/07/2018  PT End of Session - 06/07/18 0945    Visit Number  16    Number of Visits  29    Date for PT Re-Evaluation  06/26/18    Authorization Type  6/10 progress note    PT Start Time  0935    PT Stop Time  1013    PT Time Calculation (min)  38 min    Equipment Utilized During Treatment  Gait belt    Activity Tolerance  Patient tolerated treatment well;No increased pain    Behavior During Therapy  WFL for tasks assessed/performed       Past Medical History:  Diagnosis Date  . Back pain   . Breast cancer ([place]) 2007   right breast lumpectomy with rad tx  . Cancer [facility]) 2007   right breast  . Collagen vascular disease ([place])   . Gout   . Hard of hearing   . Hypertension   . Parkinson's disease ([place])   . Personal history of radiation therapy 2007   F/U right breast cancer    Past Surgical History:  Procedure Laterality Date  . ABDOMINAL HYSTERECTOMY    . BREAST LUMPECTOMY Right 2007   suspicious calcs, f/u with radiation  . CATARACT EXTRACTION Bilateral   . ESOPHAGOGASTRODUODENOSCOPY (EGD) WITH PROPOFOL N/A 05/25/2017   Procedure: ESOPHAGOGASTRODUODENOSCOPY (EGD) WITH PROPOFOL;  Surgeon: Lollie Sails, MD;  Location: [facility] ENDOSCOPY;  Service: Endoscopy;  Laterality: N/A;  . ESOPHAGOGASTRODUODENOSCOPY (EGD) WITH PROPOFOL N/A 05/09/2018   Procedure: ESOPHAGOGASTRODUODENOSCOPY (EGD) WITH PROPOFOL;  Surgeon: Lin Landsman, MD;  Location: [facility] ENDOSCOPY;  Service: Gastroenterology;  Laterality: N/A;  . EYE SURGERY Bilateral   . HAND SURGERY    . HIP CLOSED  REDUCTION Left 06/24/2015   Procedure: CLOSED REDUCTION HIP;  Surgeon: Dereck Leep, MD;  Location: [facility] ORS;  Service: Orthopedics;  Laterality: Left;  . HIP CLOSED REDUCTION Left 02/09/2016   Procedure: CLOSED MANIPULATION HIP;  Surgeon: Earnestine Leys, MD;  Location: [facility] ORS;  Service: Orthopedics;  Laterality: Left;  . HIP SURGERY    . JOINT REPLACEMENT     bilateral hip  . OOPHORECTOMY    . TOTAL KNEE ARTHROPLASTY Bilateral     There were no vitals filed for this visit.  Subjective Assessment - 06/07/18 0937    Subjective  Patient reports some back pain but otherwise no specific questions or concerns. HEP is still going well.     Pertinent History  Pt arrived late to appointment, limiting evaluation. Pt presents with reports of imbalance and weakness.  Pt reports she stopped exercising this past winter so her balance has gotten worse.  Pt has been using Bil crutches at all times for 3-4 years.  Pt uses crutches instead of a walker due to chronic back and Bil wrist pain (R>L) due to gout and RA. Pt denies any falls in the past 6 months but has fallen in the past. Pt has had some issues with dizziness when taking Sinemet and Neurontin together so she has separated her schedule of taking these per Dr. Sharlet Salina. Pt reports she has  trouble with mopping, vacuuming, sweeping, walking, outside work (flowers, etc).  Goals: to improve with all of these and to be able to do more in the kitchen.  Pt with h/o Bil TKA and THAs, back surgery. Pt reports her R THA dislocated x3 and L THA dislocated x2 but last time this happened was 3 years ago.  Pt with h/o osteoporosis and RA.    Pt reports numbness and tingling and pain down LLE.  Pt additionally has numbness and tingling RLE but not as worse as on the L side.  These symptoms have been present for over a year and is being followed by Dr. Sharlet Salina for this.  Pt denies any bowel or bladder issues, saddle anesthesia, night sweats, sudden changes in weight.  Pt  reports chronic pain in wrists, neck, back, Bil hip, BLEs.  Current pain in back: 4/10, best pain: 2/10, worst pain: 6/10.     Limitations  Walking;House hold activities;Standing    How long can you sit comfortably?  no issues    How long can you stand comfortably?  10 minutes    How long can you walk comfortably?  15 minutes    Diagnostic tests  Pt had MRI lumbar spine 07/08/15: L5-S1 mild disc desiccation broad-based disc bulging extending to the R neural foramen, moderate to severe facet degenerative changes, moderate to severe R foraminal stenosis, L4-5 mild disc dessication broad-based disc bulging, moderate facet degenerative changes, moderate Bil lateral recess stenosis and mild to moderate central stenosis, mild Bil foraminal stenosis, at L3-4 there is severe degenerative disc disease, moderate to severe facet degenerative changes with mild central stenosis, mild Bil foraminal stenosis, at L2-3 there is severe degenerative disc disease, R hemilaminectomy defect, L1-2 mild disc dessication broad-based disc bulging. She has had periodic steroid injections with mild to moderate relief. Pt is followed at [facility] pain clinic for this. Plan is to schedule a L L5-S1 ESI if her pain worsens.    Patient Stated Goals  see above    Currently in Pain?  Yes    Pain Score  4     Pain Location  Back    Pain Orientation  Lower    Pain Descriptors / Indicators  Aching    Pain Type  Chronic pain    Pain Onset  More than a month ago    Pain Frequency  Constant    Aggravating Factors   standing    Pain Relieving Factors  resting, medicine    Effect of Pain on Daily Activities  rest more if it hurts    Multiple Pain Sites  No       Treatment: Leg press 45 lbs heel raises, 75 lbs x 20 x 2   Neuromuscular treatment: 1/2 foam and head turns flat side down x 2 mins  Standing on the floor and tapping 1/2 foam x 20   Therapeutic activities: Berg balance, TUG, 5  X sit to stand, 10 MW, ABC scale  performed Goals reviewed with progress made towards all goals.                        PT Education - 06/07/18 [phone]    Education Details  HEP    Person(s) Educated  Patient    Methods  Explanation;Demonstration    Comprehension  Returned demonstration;Verbalized understanding       PT Short Term Goals - 03/20/18 1129      PT SHORT TERM GOAL #1  Title  Pt will independently complete HEP at least 4 days/wk for improved carryover between sessions    Time  2    Period  Weeks    Status  New        PT Long Term Goals - 06/07/18 0955      PT LONG TERM GOAL #1   Title  Pt's ABC scale will improve to at least 55% to demonstrate improvement in pt's perceived balance    Baseline  46% 06/07/18     Time  8    Period  Weeks    Status  Partially Met    Target Date  08/02/18      PT LONG TERM GOAL #2   Title  Pt's 5xSTS will improve to equal to or less than 15 seconds to demonstrates improved balance and strength    Baseline  26.47 seconds (pt pushing on thighs to stand)  05/08/18  23.20 sec 06/07/18=20.07 sec    Time  8    Period  Weeks    Status  Partially Met    Target Date  08/02/18      PT LONG TERM GOAL #3   Title  Pt's Berg Balance Test will improve to at least 45/56 to demonstrate improved balance    Baseline  28/56:  05/08/18, 32/56 berg 06/07/18    Time  8    Period  Weeks    Status  Partially Met    Target Date  08/02/18      PT LONG TERM GOAL #4   Title  Pt will improve 64mT time to at least 0.80 m/s for improved safety with ambulation in the community    Baseline  0.52 m/s using crutches: .68 m/sec, . 69 m/sec 06/07/18    Time  8    Period  Weeks    Status  Partially Met    Target Date  08/02/18      PT LONG TERM GOAL #5   Title  Pt's TUG will improve to equal to or less than 13 seconds to demonstrate improved balance, gait speed, and strength    Baseline  22.47 seconds  05/08/18  20.30, 06/07/18= 18.63    Time  8    Period  Weeks    Status   Partially Met    Target Date  08/02/18            Plan - 06/07/18 0954    Clinical Impression Statement Patient's condition has the potential to improve in response to therapy. Maximum improvement is yet to be obtained. The anticipated improvement is attainable and reasonable in a generally predictable time. Start date of reporting period  05/08/18 end date of reporting period  8 /21/19*. Patient reports  Pt was  progressed  dynamic balance exercises today, noting improved postural reactions with LOB and moving outside normal BOS. Pt was able to perform all exercises on uneven surfaces with minimal LOB noted. Dynamic balance stability activities were progressed today, with decrease control noted when attempting to perform tasks with single leg activities. Pt would continue to benefit from skilled therapy services to address further balance impairments and decrease falls risk    Rehab Potential  Good    PT Frequency  2x / week    PT Duration  8 weeks    PT Treatment/Interventions  ADLs/Self Care Home Management;Aquatic Therapy;Biofeedback;Cryotherapy;Electrical Stimulation;Iontophoresis 440mml Dexamethasone;Moist Heat;Traction;Ultrasound;DME Instruction;Gait training;Stair training;Functional mobility training;Therapeutic activities;Therapeutic exercise;Neuromuscular re-education;Balance training;Patient/family education;Manual techniques;Orthotic Fit/Training;Dry needling;Passive range of motion;Compression bandaging;Energy conservation;Taping  PT Next Visit Plan  progress strengthening program and balance interventions    PT Home Exercise Plan  seated marching, sit<>stand, standing hip Abd, standing hip E    Consulted and Agree with Plan of Care  Patient       Patient will benefit from skilled therapeutic intervention in order to improve the following deficits and impairments:  Abnormal gait, Decreased activity tolerance, Decreased balance, Decreased coordination, Decreased endurance,  Decreased knowledge of use of DME, Decreased range of motion, Decreased mobility, Decreased safety awareness, Decreased strength, Difficulty walking, Hypomobility, Increased edema, Increased fascial restricitons, Increased muscle spasms, Impaired perceived functional ability, Impaired flexibility, Impaired sensation, Improper body mechanics, Postural dysfunction, Pain  Visit Diagnosis: Unsteadiness on feet  Muscle weakness (generalized)  Other lack of coordination  Difficulty in walking, not elsewhere classified     Problem List Patient Active Problem List   Diagnosis Date Noted  . History of bilateral hip replacements 05/28/2015  . Spinal stenosis, lumbar region, with neurogenic claudication 05/28/2015  . DDD (degenerative disc disease), lumbar 03/13/2015  . Lumbar post-laminectomy syndrome 03/13/2015  . Degenerative cervical disc 03/13/2015  . Status post bilateral total hip replacement 03/13/2015  . Sacroiliac joint dysfunction 03/13/2015  . History of surgery on upper extremity 03/13/2015  . DJD of shoulder 03/13/2015  . Rheumatoid arthritis ([place]) 02/17/2015    Alanson Puls, [place] DPT 06/07/2018, 10:14 AM  [place] MAIN [facility] SERVICES [address] [place], [place], [address] Phone: [phone]   Fax:  [phone]  Name: Yvonne Singleton MRN: [id number] Date of Birth: 1934/11/20

## 2018-06-13 ENCOUNTER — Ambulatory Visit: Payer: Medicare Other | Admitting: Physical Therapy

## 2018-06-15 ENCOUNTER — Encounter: Payer: Self-pay | Admitting: Physical Therapy

## 2018-06-15 ENCOUNTER — Ambulatory Visit: Payer: Medicare Other | Admitting: Physical Therapy

## 2018-06-15 VITALS — BP 138/52

## 2018-06-15 DIAGNOSIS — M6281 Muscle weakness (generalized): Secondary | ICD-10-CM

## 2018-06-15 DIAGNOSIS — R278 Other lack of coordination: Secondary | ICD-10-CM

## 2018-06-15 DIAGNOSIS — R2681 Unsteadiness on feet: Secondary | ICD-10-CM | POA: Diagnosis not present

## 2018-06-15 DIAGNOSIS — R262 Difficulty in walking, not elsewhere classified: Secondary | ICD-10-CM

## 2018-06-15 NOTE — Therapy (Signed)
[place] MAIN [facility] SERVICES [address] [place], [place], [address] Phone: [phone]   Fax:  [phone]  Physical Therapy Treatment  Patient Details  Name: Yvonne Singleton MRN: [id number] Date of Birth: 1935-10-07 Referring Provider: Dr. Jennings Books   Encounter Date: 06/15/2018  PT End of Session - 06/15/18 0906    Visit Number  17    Number of Visits  29    Date for PT Re-Evaluation  06/26/18    Authorization Type  7/10 progress note    PT Start Time  0850    PT Stop Time  0930    PT Time Calculation (min)  40 min    Equipment Utilized During Treatment  Gait belt    Activity Tolerance  Patient tolerated treatment well;No increased pain    Behavior During Therapy  WFL for tasks assessed/performed       Past Medical History:  Diagnosis Date  . Back pain   . Breast cancer ([place]) 2007   right breast lumpectomy with rad tx  . Cancer [facility]) 2007   right breast  . Collagen vascular disease ([place])   . Gout   . Hard of hearing   . Hypertension   . Parkinson's disease ([place])   . Personal history of radiation therapy 2007   F/U right breast cancer    Past Surgical History:  Procedure Laterality Date  . ABDOMINAL HYSTERECTOMY    . BREAST LUMPECTOMY Right 2007   suspicious calcs, f/u with radiation  . CATARACT EXTRACTION Bilateral   . ESOPHAGOGASTRODUODENOSCOPY (EGD) WITH PROPOFOL N/A 05/25/2017   Procedure: ESOPHAGOGASTRODUODENOSCOPY (EGD) WITH PROPOFOL;  Surgeon: Lollie Sails, MD;  Location: [facility] ENDOSCOPY;  Service: Endoscopy;  Laterality: N/A;  . ESOPHAGOGASTRODUODENOSCOPY (EGD) WITH PROPOFOL N/A 05/09/2018   Procedure: ESOPHAGOGASTRODUODENOSCOPY (EGD) WITH PROPOFOL;  Surgeon: Lin Landsman, MD;  Location: [facility] ENDOSCOPY;  Service: Gastroenterology;  Laterality: N/A;  . EYE SURGERY Bilateral   . HAND SURGERY    . HIP CLOSED REDUCTION Left 06/24/2015   Procedure: CLOSED REDUCTION HIP;  Surgeon: Dereck Leep, MD;   Location: [facility] ORS;  Service: Orthopedics;  Laterality: Left;  . HIP CLOSED REDUCTION Left 02/09/2016   Procedure: CLOSED MANIPULATION HIP;  Surgeon: Earnestine Leys, MD;  Location: [facility] ORS;  Service: Orthopedics;  Laterality: Left;  . HIP SURGERY    . JOINT REPLACEMENT     bilateral hip  . OOPHORECTOMY    . TOTAL KNEE ARTHROPLASTY Bilateral     Vitals:   06/15/18 0904  BP: (!) 138/52    Subjective Assessment - 06/15/18 0904    Subjective  Patient reports that her BP went up and her feet swelled. she saw her MD and she is going back tomorrow for more tests.     Pertinent History  Pt arrived late to appointment, limiting evaluation. Pt presents with reports of imbalance and weakness.  Pt reports she stopped exercising this past winter so her balance has gotten worse.  Pt has been using Bil crutches at all times for 3-4 years.  Pt uses crutches instead of a walker due to chronic back and Bil wrist pain (R>L) due to gout and RA. Pt denies any falls in the past 6 months but has fallen in the past. Pt has had some issues with dizziness when taking Sinemet and Neurontin together so she has separated her schedule of taking these per Dr. Sharlet Salina. Pt reports she has trouble with mopping, vacuuming, sweeping, walking, outside work (  flowers, etc).  Goals: to improve with all of these and to be able to do more in the kitchen.  Pt with h/o Bil TKA and THAs, back surgery. Pt reports her R THA dislocated x3 and L THA dislocated x2 but last time this happened was 3 years ago.  Pt with h/o osteoporosis and RA.    Pt reports numbness and tingling and pain down LLE.  Pt additionally has numbness and tingling RLE but not as worse as on the L side.  These symptoms have been present for over a year and is being followed by Dr. Sharlet Salina for this.  Pt denies any bowel or bladder issues, saddle anesthesia, night sweats, sudden changes in weight.  Pt reports chronic pain in wrists, neck, back, Bil hip, BLEs.  Current pain in  back: 4/10, best pain: 2/10, worst pain: 6/10.     Limitations  Walking;House hold activities;Standing    How long can you sit comfortably?  no issues    How long can you stand comfortably?  10 minutes    How long can you walk comfortably?  15 minutes    Diagnostic tests  Pt had MRI lumbar spine 07/08/15: L5-S1 mild disc desiccation broad-based disc bulging extending to the R neural foramen, moderate to severe facet degenerative changes, moderate to severe R foraminal stenosis, L4-5 mild disc dessication broad-based disc bulging, moderate facet degenerative changes, moderate Bil lateral recess stenosis and mild to moderate central stenosis, mild Bil foraminal stenosis, at L3-4 there is severe degenerative disc disease, moderate to severe facet degenerative changes with mild central stenosis, mild Bil foraminal stenosis, at L2-3 there is severe degenerative disc disease, R hemilaminectomy defect, L1-2 mild disc dessication broad-based disc bulging. She has had periodic steroid injections with mild to moderate relief. Pt is followed at [facility] pain clinic for this. Plan is to schedule a L L5-S1 ESI if her pain worsens.    Patient Stated Goals  see above    Currently in Pain?  Yes    Pain Score  4     Pain Location  Back    Pain Orientation  Lower    Pain Descriptors / Indicators  Aching    Pain Onset  More than a month ago    Pain Frequency  Constant    Aggravating Factors   standing    Pain Relieving Factors  sitting    Effect of Pain on Daily Activities  none    Multiple Pain Sites  No     Ther-ex  NuStep L2 x 5 minutes during history (3 minutes unbilled); Standing marching with 2# ankle weights x 15; Standing hip extension with 2# ankle weights x 15; Standing hip abduction with 2# ankle weights x 15;  Standing HS curls 2# ankle weights x 15;  Neuromuscular Re-education  Airex balance with toe taps to 6" step alternating LE x 10 each, faded UE support, pt able to perform with 2 finger support  at the end of the set; Static balance on 1/2 bolster (flat side up) with no UE support 30s x 2; Standing on 1/2 bolster (Flat side up) heel/toe rock x15 with no UE hold for balance but intermittent assist from therapist to prevent falls especially with heel rocking and posterior LOB   Treatment: TREATMENT      CGA and Min to mod verbal cues used throughout with increased in postural sway and LOB most seen with narrow base of support and while on uneven surfaces. Continues to have  balance deficits typical with diagnosis. Patient performs intermediate level exercises without pain behaviors and needs verbal cuing for postural alignment and head positioning                     PT Education - 06/15/18 0906    Education Details  HEP    Person(s) Educated  Patient    Methods  Explanation;Tactile cues;Verbal cues    Comprehension  Verbalized understanding;Returned demonstration       PT Short Term Goals - 03/20/18 1129      PT SHORT TERM GOAL #1   Title  Pt will independently complete HEP at least 4 days/wk for improved carryover between sessions    Time  2    Period  Weeks    Status  New        PT Long Term Goals - 06/07/18 0955      PT LONG TERM GOAL #1   Title  Pt's ABC scale will improve to at least 55% to demonstrate improvement in pt's perceived balance    Baseline  46% 06/07/18     Time  8    Period  Weeks    Status  Partially Met    Target Date  08/02/18      PT LONG TERM GOAL #2   Title  Pt's 5xSTS will improve to equal to or less than 15 seconds to demonstrates improved balance and strength    Baseline  26.47 seconds (pt pushing on thighs to stand)  05/08/18  23.20 sec 06/07/18=20.07 sec    Time  8    Period  Weeks    Status  Partially Met    Target Date  08/02/18      PT LONG TERM GOAL #3   Title  Pt's Berg Balance Test will improve to at least 45/56 to demonstrate improved balance    Baseline  28/56:  05/08/18, 32/56 berg 06/07/18    Time  8     Period  Weeks    Status  Partially Met    Target Date  08/02/18      PT LONG TERM GOAL #4   Title  Pt will improve 56mT time to at least 0.80 m/s for improved safety with ambulation in the community    Baseline  0.52 m/s using crutches: .68 m/sec, . 69 m/sec 06/07/18    Time  8    Period  Weeks    Status  Partially Met    Target Date  08/02/18      PT LONG TERM GOAL #5   Title  Pt's TUG will improve to equal to or less than 13 seconds to demonstrate improved balance, gait speed, and strength    Baseline  22.47 seconds  05/08/18  20.30, 06/07/18= 18.63    Time  8    Period  Weeks    Status  Partially Met    Target Date  08/02/18            Plan - 06/15/18 0908    Clinical Impression Statement  Pt was able to progress dynamic balance exercises today, noting improved postural reactions with LOB and moving outside normal BOS. Pt was able to perform all exercises on uneven surfaces with minimal LOB noted. Dynamic balance stability activities were progressed today, with decrease control noted when attempting to perform tasks with single leg activities. Pt would continue to benefit from skilled therapy services to address further balance impairments and decrease falls risk.  Rehab Potential  Good    PT Frequency  2x / week    PT Duration  8 weeks    PT Treatment/Interventions  ADLs/Self Care Home Management;Aquatic Therapy;Biofeedback;Cryotherapy;Electrical Stimulation;Iontophoresis 53m/ml Dexamethasone;Moist Heat;Traction;Ultrasound;DME Instruction;Gait training;Stair training;Functional mobility training;Therapeutic activities;Therapeutic exercise;Neuromuscular re-education;Balance training;Patient/family education;Manual techniques;Orthotic Fit/Training;Dry needling;Passive range of motion;Compression bandaging;Energy conservation;Taping    PT Next Visit Plan  progress strengthening program and balance interventions    PT Home Exercise Plan  seated marching, sit<>stand, standing hip  Abd, standing hip E    Consulted and Agree with Plan of Care  Patient       Patient will benefit from skilled therapeutic intervention in order to improve the following deficits and impairments:  Abnormal gait, Decreased activity tolerance, Decreased balance, Decreased coordination, Decreased endurance, Decreased knowledge of use of DME, Decreased range of motion, Decreased mobility, Decreased safety awareness, Decreased strength, Difficulty walking, Hypomobility, Increased edema, Increased fascial restricitons, Increased muscle spasms, Impaired perceived functional ability, Impaired flexibility, Impaired sensation, Improper body mechanics, Postural dysfunction, Pain  Visit Diagnosis: Unsteadiness on feet  Muscle weakness (generalized)  Other lack of coordination  Difficulty in walking, not elsewhere classified     Problem List Patient Active Problem List   Diagnosis Date Noted  . History of bilateral hip replacements 05/28/2015  . Spinal stenosis, lumbar region, with neurogenic claudication 05/28/2015  . DDD (degenerative disc disease), lumbar 03/13/2015  . Lumbar post-laminectomy syndrome 03/13/2015  . Degenerative cervical disc 03/13/2015  . Status post bilateral total hip replacement 03/13/2015  . Sacroiliac joint dysfunction 03/13/2015  . History of surgery on upper extremity 03/13/2015  . DJD of shoulder 03/13/2015  . Rheumatoid arthritis ([place] 02/17/2015    MAlanson Puls PVirginiaDPT 06/15/2018, 9:10 AM  [place] [facility] [address] [address]Phone: [phone]  Fax:  [phone] Name: GAnalaya HoeyMRN: [id number] of Birth: 5Aug 27, 1936

## 2018-06-22 ENCOUNTER — Ambulatory Visit: Payer: Medicare Other | Attending: Neurology | Admitting: Physical Therapy

## 2018-06-22 ENCOUNTER — Encounter: Payer: Self-pay | Admitting: Physical Therapy

## 2018-06-22 DIAGNOSIS — R262 Difficulty in walking, not elsewhere classified: Secondary | ICD-10-CM

## 2018-06-22 DIAGNOSIS — R278 Other lack of coordination: Secondary | ICD-10-CM | POA: Diagnosis present

## 2018-06-22 DIAGNOSIS — R2681 Unsteadiness on feet: Secondary | ICD-10-CM | POA: Diagnosis present

## 2018-06-22 DIAGNOSIS — M6281 Muscle weakness (generalized): Secondary | ICD-10-CM | POA: Diagnosis present

## 2018-06-22 NOTE — Therapy (Signed)
[place] MAIN [facility] SERVICES [address] [place], [place], [address] Phone: [phone]   Fax:  [phone]  Physical Therapy Treatment  Patient Details  Name: Yvonne Singleton MRN: [id number] Date of Birth: Oct 11, 1935 Referring Provider: Dr. Jennings Books   Encounter Date: 06/22/2018  PT End of Session - 06/22/18 0909    Visit Number  18    Number of Visits  29    Date for PT Re-Evaluation  06/26/18    Authorization Type  8/10 progress note    PT Start Time  0900    PT Stop Time  0945    PT Time Calculation (min)  45 min    Equipment Utilized During Treatment  Gait belt    Activity Tolerance  Patient tolerated treatment well;No increased pain    Behavior During Therapy  WFL for tasks assessed/performed       Past Medical History:  Diagnosis Date  . Back pain   . Breast cancer ([place]) 2007   right breast lumpectomy with rad tx  . Cancer [facility]) 2007   right breast  . Collagen vascular disease ([place])   . Gout   . Hard of hearing   . Hypertension   . Parkinson's disease ([place])   . Personal history of radiation therapy 2007   F/U right breast cancer    Past Surgical History:  Procedure Laterality Date  . ABDOMINAL HYSTERECTOMY    . BREAST LUMPECTOMY Right 2007   suspicious calcs, f/u with radiation  . CATARACT EXTRACTION Bilateral   . ESOPHAGOGASTRODUODENOSCOPY (EGD) WITH PROPOFOL N/A 05/25/2017   Procedure: ESOPHAGOGASTRODUODENOSCOPY (EGD) WITH PROPOFOL;  Surgeon: Lollie Sails, MD;  Location: [facility] ENDOSCOPY;  Service: Endoscopy;  Laterality: N/A;  . ESOPHAGOGASTRODUODENOSCOPY (EGD) WITH PROPOFOL N/A 05/09/2018   Procedure: ESOPHAGOGASTRODUODENOSCOPY (EGD) WITH PROPOFOL;  Surgeon: Lin Landsman, MD;  Location: [facility] ENDOSCOPY;  Service: Gastroenterology;  Laterality: N/A;  . EYE SURGERY Bilateral   . HAND SURGERY    . HIP CLOSED REDUCTION Left 06/24/2015   Procedure: CLOSED REDUCTION HIP;  Surgeon: Dereck Leep, MD;   Location: [facility] ORS;  Service: Orthopedics;  Laterality: Left;  . HIP CLOSED REDUCTION Left 02/09/2016   Procedure: CLOSED MANIPULATION HIP;  Surgeon: Earnestine Leys, MD;  Location: [facility] ORS;  Service: Orthopedics;  Laterality: Left;  . HIP SURGERY    . JOINT REPLACEMENT     bilateral hip  . OOPHORECTOMY    . TOTAL KNEE ARTHROPLASTY Bilateral     There were no vitals filed for this visit.  Subjective Assessment - 06/22/18 0906    Subjective  Patient is now going to go to the vascular MD next.    Pertinent History  Pt arrived late to appointment, limiting evaluation. Pt presents with reports of imbalance and weakness.  Pt reports she stopped exercising this past winter so her balance has gotten worse.  Pt has been using Bil crutches at all times for 3-4 years.  Pt uses crutches instead of a walker due to chronic back and Bil wrist pain (R>L) due to gout and RA. Pt denies any falls in the past 6 months but has fallen in the past. Pt has had some issues with dizziness when taking Sinemet and Neurontin together so she has separated her schedule of taking these per Dr. Sharlet Salina. Pt reports she has trouble with mopping, vacuuming, sweeping, walking, outside work (flowers, etc).  Goals: to improve with all of these and to be able to do more  in the kitchen.  Pt with h/o Bil TKA and THAs, back surgery. Pt reports her R THA dislocated x3 and L THA dislocated x2 but last time this happened was 3 years ago.  Pt with h/o osteoporosis and RA.    Pt reports numbness and tingling and pain down LLE.  Pt additionally has numbness and tingling RLE but not as worse as on the L side.  These symptoms have been present for over a year and is being followed by Dr. Sharlet Salina for this.  Pt denies any bowel or bladder issues, saddle anesthesia, night sweats, sudden changes in weight.  Pt reports chronic pain in wrists, neck, back, Bil hip, BLEs.  Current pain in back: 4/10, best pain: 2/10, worst pain: 6/10.     Limitations   Walking;House hold activities;Standing    How long can you sit comfortably?  no issues    How long can you stand comfortably?  10 minutes    How long can you walk comfortably?  15 minutes    Diagnostic tests  Pt had MRI lumbar spine 07/08/15: L5-S1 mild disc desiccation broad-based disc bulging extending to the R neural foramen, moderate to severe facet degenerative changes, moderate to severe R foraminal stenosis, L4-5 mild disc dessication broad-based disc bulging, moderate facet degenerative changes, moderate Bil lateral recess stenosis and mild to moderate central stenosis, mild Bil foraminal stenosis, at L3-4 there is severe degenerative disc disease, moderate to severe facet degenerative changes with mild central stenosis, mild Bil foraminal stenosis, at L2-3 there is severe degenerative disc disease, R hemilaminectomy defect, L1-2 mild disc dessication broad-based disc bulging. She has had periodic steroid injections with mild to moderate relief. Pt is [facility] at [facility] pain clinic for this. Plan is to schedule a L L5-S1 ESI if her pain worsens.    Patient Stated Goals  see above    Currently in Pain?  Yes    Pain Score  2     Pain Location  Back    Pain Orientation  Lower    Pain Descriptors / Indicators  Aching    Pain Radiating Towards  na    Pain Onset  More than a month ago    Aggravating Factors   walking    Pain Relieving Factors  sitting    Multiple Pain Sites  No         Ther-ex  Warm up on NuStep L2  x 5 minutes during history   Quantum leg press 90 lbs x 15   Standing exercises with RTB BLE : Marching 2 x 10;;cues to raise up knees higher SLR 2 x 10; cues for performing slowly Abduction 2 x 10;cues for not leaning towards the opposite side Extension 2 x 10; cues not to lean forward with trunk  Heel raises 2 x 10; cues to not rock forward Eccentric step downs x 10 BLE; cues to not put too much weight bearing on UE's Standing mini squats 2 x 10 with RTB around knees to  encourage abduction; cues to maintain correct erect posture Sit to stand without UE support 2 x 10; cues for technique and posture correction Step-ups to 6" step x 10 bilateral; cues for getting entire foot on step Hooklying marching with 3 lbs x 15 x 2 Hooklying hip abd/ER x 15   Patient performs intermediate level exercises without pain behaviors and needs verbal cuing for postural alignment and head positioning  PT Education - 06/22/18 0908    Education Details  HEP    Person(s) Educated  Patient    Methods  Explanation    Comprehension  Verbalized understanding;Returned demonstration       PT Short Term Goals - 03/20/18 1129      PT SHORT TERM GOAL #1   Title  Pt will independently complete HEP at least 4 days/wk for improved carryover between sessions    Time  2    Period  Weeks    Status  New        PT Long Term Goals - 06/07/18 0955      PT LONG TERM GOAL #1   Title  Pt's ABC scale will improve to at least 55% to demonstrate improvement in pt's perceived balance    Baseline  46% 06/07/18     Time  8    Period  Weeks    Status  Partially Met    Target Date  08/02/18      PT LONG TERM GOAL #2   Title  Pt's 5xSTS will improve to equal to or less than 15 seconds to demonstrates improved balance and strength    Baseline  26.47 seconds (pt pushing on thighs to stand)  05/08/18  23.20 sec 06/07/18=20.07 sec    Time  8    Period  Weeks    Status  Partially Met    Target Date  08/02/18      PT LONG TERM GOAL #3   Title  Pt's Berg Balance Test will improve to at least 45/56 to demonstrate improved balance    Baseline  28/56:  05/08/18, 32/56 berg 06/07/18    Time  8    Period  Weeks    Status  Partially Met    Target Date  08/02/18      PT LONG TERM GOAL #4   Title  Pt will improve 59mT time to at least 0.80 m/s for improved safety with ambulation in the community    Baseline  0.52 m/s using crutches: .68 m/sec, . 69 m/sec 06/07/18     Time  8    Period  Weeks    Status  Partially Met    Target Date  08/02/18      PT LONG TERM GOAL #5   Title  Pt's TUG will improve to equal to or less than 13 seconds to demonstrate improved balance, gait speed, and strength    Baseline  22.47 seconds  05/08/18  20.30, 06/07/18= 18.63    Time  8    Period  Weeks    Status  Partially Met    Target Date  08/02/18            Plan - 06/22/18 0920    Clinical Impression Statement  Patient performs open chain and closed chain exercises of moderate difficulty. she is progressed in her HEP and challenged in her gait with TM walking..Marland KitchenShe will continue to benefit from skilled PT to improve gait and mobility and quality of life.    Rehab Potential  Good    PT Frequency  2x / week    PT Duration  8 weeks    PT Treatment/Interventions  ADLs/Self Care Home Management;Aquatic Therapy;Biofeedback;Cryotherapy;Electrical Stimulation;Iontophoresis 463mml Dexamethasone;Moist Heat;Traction;Ultrasound;DME Instruction;Gait training;Stair training;Functional mobility training;Therapeutic activities;Therapeutic exercise;Neuromuscular re-education;Balance training;Patient/family education;Manual techniques;Orthotic Fit/Training;Dry needling;Passive range of motion;Compression bandaging;Energy conservation;Taping    PT Next Visit Plan  progress strengthening program and balance interventions    PT Home  Exercise Plan  seated marching, sit<>stand, standing hip Abd, standing hip E    Consulted and Agree with Plan of Care  Patient       Patient will benefit from skilled therapeutic intervention in order to improve the following deficits and impairments:  Abnormal gait, Decreased activity tolerance, Decreased balance, Decreased coordination, Decreased endurance, Decreased knowledge of use of DME, Decreased range of motion, Decreased mobility, Decreased safety awareness, Decreased strength, Difficulty walking, Hypomobility, Increased edema, Increased fascial  restricitons, Increased muscle spasms, Impaired perceived functional ability, Impaired flexibility, Impaired sensation, Improper body mechanics, Postural dysfunction, Pain  Visit Diagnosis: Unsteadiness on feet  Muscle weakness (generalized)  Other lack of coordination  Difficulty in walking, not elsewhere classified     Problem List Patient Active Problem List   Diagnosis Date Noted  . History of bilateral hip replacements 05/28/2015  . Spinal stenosis, lumbar region, with neurogenic claudication 05/28/2015  . DDD (degenerative disc disease), lumbar 03/13/2015  . Lumbar post-laminectomy syndrome 03/13/2015  . Degenerative cervical disc 03/13/2015  . Status post bilateral total hip replacement 03/13/2015  . Sacroiliac joint dysfunction 03/13/2015  . History of surgery on upper extremity 03/13/2015  . DJD of shoulder 03/13/2015  . Rheumatoid arthritis ([place]) 02/17/2015    Alanson Puls, [place] DPT 06/22/2018, 9:25 AM  [place] MAIN [facility] SERVICES [address] [place], [place], [address] Phone: [phone]   Fax:  [phone]  Name: Aaron Boeh MRN: [id number] Date of Birth: 03/13/1935

## 2018-06-27 ENCOUNTER — Ambulatory Visit: Payer: Medicare Other | Admitting: Physical Therapy

## 2018-06-27 ENCOUNTER — Encounter: Payer: Self-pay | Admitting: Physical Therapy

## 2018-06-27 ENCOUNTER — Encounter (INDEPENDENT_AMBULATORY_CARE_PROVIDER_SITE_OTHER): Payer: Self-pay | Admitting: Nurse Practitioner

## 2018-06-27 ENCOUNTER — Ambulatory Visit (INDEPENDENT_AMBULATORY_CARE_PROVIDER_SITE_OTHER): Payer: Medicare Other | Admitting: Nurse Practitioner

## 2018-06-27 VITALS — BP 123/64 | HR 76 | Resp 16 | Ht 64.0 in | Wt 187.0 lb

## 2018-06-27 DIAGNOSIS — K219 Gastro-esophageal reflux disease without esophagitis: Secondary | ICD-10-CM

## 2018-06-27 DIAGNOSIS — R600 Localized edema: Secondary | ICD-10-CM

## 2018-06-27 DIAGNOSIS — M51369 Other intervertebral disc degeneration, lumbar region without mention of lumbar back pain or lower extremity pain: Secondary | ICD-10-CM

## 2018-06-27 DIAGNOSIS — E785 Hyperlipidemia, unspecified: Secondary | ICD-10-CM | POA: Diagnosis not present

## 2018-06-27 DIAGNOSIS — R2681 Unsteadiness on feet: Secondary | ICD-10-CM | POA: Diagnosis not present

## 2018-06-27 DIAGNOSIS — R278 Other lack of coordination: Secondary | ICD-10-CM

## 2018-06-27 DIAGNOSIS — I10 Essential (primary) hypertension: Secondary | ICD-10-CM | POA: Diagnosis not present

## 2018-06-27 DIAGNOSIS — R262 Difficulty in walking, not elsewhere classified: Secondary | ICD-10-CM

## 2018-06-27 DIAGNOSIS — M5136 Other intervertebral disc degeneration, lumbar region: Secondary | ICD-10-CM

## 2018-06-27 DIAGNOSIS — M6281 Muscle weakness (generalized): Secondary | ICD-10-CM

## 2018-06-27 NOTE — Progress Notes (Signed)
Subjective:    Patient ID: Yvonne Singleton, female    DOB: Jul 16, 1935, 83 y.o.   MRN: [id number] Chief Complaint  Patient presents with  . New Patient (Initial Visit)    Lymphedema    HPI  Yvonne Singleton is a 83 y.o. female who presents with bilateral lower extremity edema which has been present for approximately 2 months, with gradually increasing intensity.  The patient states that her swelling is better in the morning and it becomes aggressively worse throughout the day as her legs are in the dependent position.  The patient states that she generally wears compression stockings, although unsure of what strength.  She also states that her primary care physician increased her Lasix from 20 mg to 30 mg without change in swelling.  She states that she recently underwent an ABI through her primary care provider's office, which she states was normal.  The patient denies any fever, nausea, vomiting, chills.  The patient denies any amaurosis fugax or TIA-like symptoms.  The patient denies any chest pain or shortness of breath.  The patient denies any recent injury to her bilateral lower extremities.  Constitutional: [] Weight loss  [] Fever  [] Chills Cardiac: [] Chest pain   [] Chest pressure   [] Palpitations   [] Shortness of breath when laying flat   [] Shortness of breath with exertion. Vascular:  [] Pain in legs with walking   [] Pain in legs with standing  [] History of DVT   [] Phlebitis   [x] Swelling in legs   [] Varicose veins   [] Non-healing ulcers Pulmonary:   [] Uses home oxygen   [] Productive cough   [] Hemoptysis   [] Wheeze  [] COPD   [] Asthma Neurologic:  [] Dizziness   [] Seizures   [] History of stroke   [] History of TIA  [] Aphasia   [] Vissual changes   [] Weakness or numbness in arm   [] Weakness or numbness in leg Musculoskeletal:   [x] Joint swelling   [] Joint pain   [x] Low back pain Hematologic:  [x] Easy bruising  [] Easy bleeding   [] Hypercoagulable state    [] Anemic Gastrointestinal:  [] Diarrhea   [] Vomiting  [] Gastroesophageal reflux/heartburn   [] Difficulty swallowing. Genitourinary:  [] Chronic kidney disease   [] Difficult urination  [] Frequent urination   [] Blood in urine Skin:  [] Rashes   [] Ulcers  Psychological:  [] History of anxiety   []  History of major depression.     Objective:   Physical Exam  BP 123/64 (BP Location: Right Arm, Patient Position: Sitting)   Pulse 76   Resp 16   Ht 5\' 4"  (1.626 m)   Wt 187 lb (84.8 kg)   BMI 32.10 kg/m   Past Medical History:  Diagnosis Date  . Back pain   . Breast cancer ([place]) 2007   right breast lumpectomy with rad tx  . Cancer [facility]) 2007   right breast  . Collagen vascular disease ([place])   . Gout   . Hard of hearing   . Hypertension   . Parkinson's disease ([place])   . Personal history of radiation therapy 2007   F/U right breast cancer     Gen: WD/WN, NAD Head: [place]/AT, No temporalis wasting.  Ear/Nose/Throat: Hearing grossly intact, nares w/o erythema or drainage Eyes: PER, EOMI, sclera nonicteric.  Neck: Supple, no masses.  No JVD.  Pulmonary:  Good air movement, no use of accessory muscles.  Cardiac: RRR Vascular:  2+ pitting edema on the left lower extremity, 3+ pitting edema on the right.  No ulcers present.  No weeping present. Vessel Right Left  PT  difficult to palpate  difficult to palpate  Gastrointestinal: soft, non-distended. No guarding/no peritoneal signs.  Musculoskeletal: Patient using crutches, stiffness with movement Neurologic: Pain and light touch intact in extremities.  Symmetrical.  Speech is fluent. Motor exam as listed above.  Tremors Psychiatric: Judgment intact, Mood & affect appropriate for pt's clinical situation. Dermatologic:  Venous stasis dermatitis bilateral lower extremities no Ulcers Noted.  No changes consistent with cellulitis. Lymph : Changes of dermal thickening present   Social History   Socioeconomic History  . Marital status: Widowed     Spouse name: Not on file  . Number of children: Not on file  . Years of education: Not on file  . Highest education level: Not on file  Occupational History  . Not on file  Social Needs  . Financial resource strain: Not on file  . Food insecurity:    Worry: Not on file    Inability: Not on file  . Transportation needs:    Medical: Not on file    Non-medical: Not on file  Tobacco Use  . Smoking status: Never Smoker  . Smokeless tobacco: Never Used  Substance and Sexual Activity  . Alcohol use: No    Alcohol/week: 0.0 standard drinks  . Drug use: No  . Sexual activity: Not on file  Lifestyle  . Physical activity:    Days per week: Not on file    Minutes per session: Not on file  . Stress: Not on file  Relationships  . Social connections:    Talks on phone: Not on file    Gets together: Not on file    Attends religious service: Not on file    Active member of club or organization: Not on file    Attends meetings of clubs or organizations: Not on file    Relationship status: Not on file  . Intimate partner violence:    Fear of current or ex partner: Not on file    Emotionally abused: Not on file    Physically abused: Not on file    Forced sexual activity: Not on file  Other Topics Concern  . Not on file  Social History Narrative  . Not on file    Past Surgical History:  Procedure Laterality Date  . ABDOMINAL HYSTERECTOMY    . BREAST LUMPECTOMY Right 2007   suspicious calcs, f/u with radiation  . CATARACT EXTRACTION Bilateral   . ESOPHAGOGASTRODUODENOSCOPY (EGD) WITH PROPOFOL N/A 05/25/2017   Procedure: ESOPHAGOGASTRODUODENOSCOPY (EGD) WITH PROPOFOL;  Surgeon: Lollie Sails, MD;  Location: [facility] ENDOSCOPY;  Service: Endoscopy;  Laterality: N/A;  . ESOPHAGOGASTRODUODENOSCOPY (EGD) WITH PROPOFOL N/A 05/09/2018   Procedure: ESOPHAGOGASTRODUODENOSCOPY (EGD) WITH PROPOFOL;  Surgeon: Lin Landsman, MD;  Location: [facility] ENDOSCOPY;  Service: Gastroenterology;   Laterality: N/A;  . EYE SURGERY Bilateral   . HAND SURGERY    . HIP CLOSED REDUCTION Left 06/24/2015   Procedure: CLOSED REDUCTION HIP;  Surgeon: Dereck Leep, MD;  Location: [facility] ORS;  Service: Orthopedics;  Laterality: Left;  . HIP CLOSED REDUCTION Left 02/09/2016   Procedure: CLOSED MANIPULATION HIP;  Surgeon: Earnestine Leys, MD;  Location: [facility] ORS;  Service: Orthopedics;  Laterality: Left;  . HIP SURGERY    . JOINT REPLACEMENT     bilateral hip  . OOPHORECTOMY    . TOTAL KNEE ARTHROPLASTY Bilateral     Family History  Problem Relation Age of Onset  . Heart disease Mother   . Arthritis Mother   . Heart disease Father   .  Ulcers Father   . Breast cancer Maternal Aunt     Allergies  Allergen Reactions  . Sodium Chloride Hives  . Trospium Nausea And Vomiting  . Iodinated Diagnostic Agents Hives  . Streptomycin Hives       Assessment & Plan:   1. Lower extremity edema I have had a long discussion with the patient regarding swelling and why it  causes symptoms.  Patient will begin wearing graduated compression stockings class 1 (20-30 mmHg) on a daily basis a prescription was given. The patient will  beginning wearing the stockings first thing in the morning and removing them in the evening. The patient is instructed specifically not to sleep in the stockings.   In addition, behavioral modification will be initiated.  This will include frequent elevation, use of over the counter pain medications and exercise such as walking.  I have reviewed systemic causes for chronic edema such as liver, kidney and cardiac etiologies.  The patient denies problems with these organ systems.    Consideration for a lymph pump will also be made based upon the effectiveness of conservative therapy.  This would help to improve the edema control and prevent sequela such as ulcers and infections   Patient should undergo duplex ultrasound of the venous system to ensure that DVT or reflux is not  present.  The patient will follow-up with me after the ultrasound.    - VAS [place] LOWER EXTREMITY VENOUS REFLUX; Future  2. Hypertension, unspecified type Continue antihypertensive medications as already ordered, these medications have been reviewed and there are no changes at this time.   3. Gastroesophageal reflux disease, esophagitis presence not specified Continue PPI as already ordered, this medication has been reviewed and there are no changes at this time.  Avoidence of caffeine and alcohol  Moderate elevation of the head of the bed   4. Hyperlipidemia, unspecified hyperlipidemia type Continue statin as ordered and reviewed, no changes at this time   5. DDD (degenerative disc disease), lumbar Continue NSAID medications as already ordered, these medications have been reviewed and there are no changes at this time.  Continued activity and therapy was stressed.     Current Outpatient Medications on File Prior to Visit  Medication Sig Dispense Refill  . allopurinol (ZYLOPRIM) 100 MG tablet Take 200 mg by mouth at bedtime.     Marland Kitchen b complex vitamins tablet Take 1 tablet by mouth daily.    . Calcium Carbonate-Vitamin D (CALCIUM 600+D) 600-200 MG-UNIT TABS Take 1 tablet by mouth daily.    . carbidopa-levodopa (SINEMET CR) 50-200 MG per tablet Take 1 tablet by mouth at bedtime.     . cholecalciferol (VITAMIN D) 1000 units tablet Take 2,000 Units by mouth daily.    . furosemide (LASIX) 20 MG tablet Take 30 mg by mouth.    . gabapentin (NEURONTIN) 100 MG capsule Limit 1 capsule by mouth per day or every other day if tolerated 30 capsule 0  . hydroxychloroquine (PLAQUENIL) 200 MG tablet Take 400 mg by mouth daily.     Marland Kitchen latanoprost (XALATAN) 0.005 % ophthalmic solution   4  . losartan (COZAAR) 100 MG tablet Take 100 mg by mouth daily.     . metroNIDAZOLE (METROGEL) 0.75 % vaginal gel metronidazole 0.75 % vaginal gel    . omeprazole (PRILOSEC) 40 MG capsule Take 1 capsule (40 mg  total) by mouth daily before breakfast. 90 capsule 0  . tolterodine (DETROL LA) 4 MG 24 hr capsule Take 4 mg  by mouth daily.     Current Facility-Administered Medications on File Prior to Visit  Medication Dose Route Frequency Provider Last Rate Last Dose  . fentaNYL (SUBLIMAZE) injection 100 mcg  100 mcg Intravenous Once Mohammed Kindle, MD      . lactated ringers infusion 1,000 mL  1,000 mL Intravenous Continuous Mohammed Kindle, MD 125 mL/hr at 05/03/16 1240 1,000 mL at 05/03/16 1240  . midazolam (VERSED) 5 MG/5ML injection 5 mg  5 mg Intravenous Once Mohammed Kindle, MD      . orphenadrine (NORFLEX) injection 60 mg  60 mg Intramuscular Once Mohammed Kindle, MD      . sodium chloride flush (NS) 0.9 % injection 20 mL  20 mL Other Once Mohammed Kindle, MD      . triamcinolone acetonide (KENALOG-40) injection 40 mg  40 mg Other Once Mohammed Kindle, MD        There are no Patient Instructions on file for this visit. Return in about 1 month (around 07/27/2018).   Kris Hartmann, NP

## 2018-06-27 NOTE — Therapy (Signed)
[place] MAIN [facility] SERVICES [place], [place], [address] Phone: [phone]   Fax:  [phone]  Physical Therapy Treatment/ Physical Therapy Progress Note   Dates of reporting period  05/17/18  to  5/85/27/ Re certification   Patient Details  Name: Yvonne Singleton MRN: [id number] Date of Birth: 28-May-1935 Referring Provider: Dr. Jennings Books   Encounter Date: 06/27/2018  PT End of Session - 06/27/18 0814    Visit Number  19    Number of Visits  29    Date for PT Re-Evaluation  06/26/18    Authorization Type  9/10 progress note    PT Start Time  0805    PT Stop Time  0845    PT Time Calculation (min)  40 min    Equipment Utilized During Treatment  Gait belt    Activity Tolerance  Patient tolerated treatment well;No increased pain    Behavior During Therapy  WFL for tasks assessed/performed       Past Medical History:  Diagnosis Date  . Back pain   . Breast cancer ([place]) 2007   right breast lumpectomy with rad tx  . Cancer [facility]) 2007   right breast  . Collagen vascular disease ([place])   . Gout   . Hard of hearing   . Hypertension   . Parkinson's disease ([place])   . Personal history of radiation therapy 2007   F/U right breast cancer    Past Surgical History:  Procedure Laterality Date  . ABDOMINAL HYSTERECTOMY    . BREAST LUMPECTOMY Right 2007   suspicious calcs, f/u with radiation  . CATARACT EXTRACTION Bilateral   . ESOPHAGOGASTRODUODENOSCOPY (EGD) WITH PROPOFOL N/A 05/25/2017   Procedure: ESOPHAGOGASTRODUODENOSCOPY (EGD) WITH PROPOFOL;  Surgeon: Lollie Sails, MD;  Location: [facility] ENDOSCOPY;  Service: Endoscopy;  Laterality: N/A;  . ESOPHAGOGASTRODUODENOSCOPY (EGD) WITH PROPOFOL N/A 05/09/2018   Procedure: ESOPHAGOGASTRODUODENOSCOPY (EGD) WITH PROPOFOL;  Surgeon: Lin Landsman, MD;  Location: [facility] ENDOSCOPY;  Service: Gastroenterology;  Laterality: N/A;  . EYE SURGERY Bilateral   . HAND SURGERY     . HIP CLOSED REDUCTION Left 06/24/2015   Procedure: CLOSED REDUCTION HIP;  Surgeon: Dereck Leep, MD;  Location: [facility] ORS;  Service: Orthopedics;  Laterality: Left;  . HIP CLOSED REDUCTION Left 02/09/2016   Procedure: CLOSED MANIPULATION HIP;  Surgeon: Earnestine Leys, MD;  Location: [facility] ORS;  Service: Orthopedics;  Laterality: Left;  . HIP SURGERY    . JOINT REPLACEMENT     bilateral hip  . OOPHORECTOMY    . TOTAL KNEE ARTHROPLASTY Bilateral     There were no vitals filed for this visit.  Subjective Assessment - 06/27/18 0813    Subjective  Patient has her vascular appointment today at 3:00. Her back is hurting 4/10 today.    Pertinent History  Pt arrived late to appointment, limiting evaluation. Pt presents with reports of imbalance and weakness.  Pt reports she stopped exercising this past winter so her balance has gotten worse.  Pt has been using Bil crutches at all times for 3-4 years.  Pt uses crutches instead of a walker due to chronic back and Bil wrist pain (R>L) due to gout and RA. Pt denies any falls in the past 6 months but has fallen in the past. Pt has had some issues with dizziness when taking Sinemet and Neurontin together so she has separated her schedule of taking these per Dr. Sharlet Salina. Pt reports she has trouble with mopping,  vacuuming, sweeping, walking, outside work (flowers, etc).  Goals: to improve with all of these and to be able to do more in the kitchen.  Pt with h/o Bil TKA and THAs, back surgery. Pt reports her R THA dislocated x3 and L THA dislocated x2 but last time this happened was 3 years ago.  Pt with h/o osteoporosis and RA.    Pt reports numbness and tingling and pain down LLE.  Pt additionally has numbness and tingling RLE but not as worse as on the L side.  These symptoms have been present for over a year and is being followed by Dr. Sharlet Salina for this.  Pt denies any bowel or bladder issues, saddle anesthesia, night sweats, sudden changes in weight.  Pt reports  chronic pain in wrists, neck, back, Bil hip, BLEs.  Current pain in back: 4/10, best pain: 2/10, worst pain: 6/10.     Limitations  Walking;House hold activities;Standing    How long can you sit comfortably?  no issues    How long can you stand comfortably?  10 minutes    How long can you walk comfortably?  15 minutes    Diagnostic tests  Pt had MRI lumbar spine 07/08/15: L5-S1 mild disc desiccation broad-based disc bulging extending to the R neural foramen, moderate to severe facet degenerative changes, moderate to severe R foraminal stenosis, L4-5 mild disc dessication broad-based disc bulging, moderate facet degenerative changes, moderate Bil lateral recess stenosis and mild to moderate central stenosis, mild Bil foraminal stenosis, at L3-4 there is severe degenerative disc disease, moderate to severe facet degenerative changes with mild central stenosis, mild Bil foraminal stenosis, at L2-3 there is severe degenerative disc disease, R hemilaminectomy defect, L1-2 mild disc dessication broad-based disc bulging. She has had periodic steroid injections with mild to moderate relief. Pt is followed at [facility] pain clinic for this. Plan is to schedule a L L5-S1 ESI if her pain worsens.    Patient Stated Goals  see above    Currently in Pain?  Yes    Pain Score  4     Pain Location  Back    Pain Orientation  Lower    Pain Descriptors / Indicators  Aching    Pain Type  Acute pain    Pain Onset  More than a month ago    Multiple Pain Sites  No       Neuromuscular training: Patient performed outcome measures today including : ABC scale, Berg balance test, 10 MW, TUG, 5 x sit to stand test   She made progress towards all goals and will be recommended for re certification today                        PT Education - 06/27/18 0814    Education Details  saftey, HEP, balance    Person(s) Educated  Patient    Methods  Explanation;Tactile cues;Verbal cues;Demonstration    Comprehension   Verbalized understanding;Need further instruction;Verbal cues required       PT Short Term Goals - 06/27/18 0824      PT SHORT TERM GOAL #1   Title  Pt will independently complete HEP at least 4 days/wk for improved carryover between sessions    Time  2    Period  Weeks    Status  Achieved    Target Date  06/27/09        PT Long Term Goals - 06/27/18 7673  PT LONG TERM GOAL #1   Title  Pt's ABC scale will improve to at least 55% to demonstrate improvement in pt's perceived balance    Baseline  46% 06/07/18 , 06/27/18=  55%    Time  8    Period  Weeks    Status  Partially Met    Target Date  08/22/18      PT LONG TERM GOAL #2   Title  Pt's 5xSTS will improve to equal to or less than 15 seconds to demonstrates improved balance and strength    Baseline  26.47 seconds (pt pushing on thighs to stand)  05/08/18  23.20 sec 06/07/18=20.07 sec, 06/27/18= 20.05 sec    Time  8    Period  Weeks    Status  Partially Met    Target Date  08/22/18      PT LONG TERM GOAL #3   Title  Pt's Berg Balance Test will improve to at least 45/56 to demonstrate improved balance    Baseline  28/56:  05/08/18, 32/56 berg 06/07/18,   06/27/18= 34%     Time  8    Period  Weeks    Status  Partially Met    Target Date  08/22/18      PT LONG TERM GOAL #4   Title  Pt will improve 41mT time to at least 0.80 m/s for improved safety with ambulation in the community    Baseline  0.52 m/s using crutches: .68 m/sec, . 69 m/sec 06/07/18:   06/27/18= .75 m/sec    Time  8    Period  Weeks    Status  Partially Met    Target Date  08/22/18      PT LONG TERM GOAL #5   Title  Pt's TUG will improve to equal to or less than 13 seconds to demonstrate improved balance, gait speed, and strength    Baseline  22.47 seconds  05/08/18  20.30, 06/07/18= 18.63, 06/27/18= 17.96    Time  8    Period  Weeks    Status  Partially Met    Target Date  08/22/18            Plan - 06/27/18 0827    Clinical Impression Statement  Patient's condition has the potential to improve in response to therapy. Maximum improvement is yet to be obtained. The anticipated improvement is attainable and reasonable in a generally predictable time.  Patient reports that she is is feeling more confident with walking,  Patient presents with decreased gait speed, decreased balance, and decreased BLE strength. Patient's main complaint is BLE weakness and inability to participate in desired activities. Patient wants to improve balance and ability to ambulate on inclines and outdoor surfaces safely. Patient will benefit from skilled PT in order to increase gait speed, increase BLE strength, and improve dynamic standing balance to decrease risk for falls and enable patient to participate in desired activities.    Rehab Potential  Good    PT Frequency  2x / week    PT Duration  8 weeks    PT Treatment/Interventions  ADLs/Self Care Home Management;Aquatic Therapy;Biofeedback;Cryotherapy;Electrical Stimulation;Iontophoresis 412mml Dexamethasone;Moist Heat;Traction;Ultrasound;DME Instruction;Gait training;Stair training;Functional mobility training;Therapeutic activities;Therapeutic exercise;Neuromuscular re-education;Balance training;Patient/family education;Manual techniques;Orthotic Fit/Training;Dry needling;Passive range of motion;Compression bandaging;Energy conservation;Taping    PT Next Visit Plan  progress strengthening program and balance interventions    PT Home Exercise Plan  seated marching, sit<>stand, standing hip Abd, standing hip E    Consulted and Agree with Plan  of Care  Patient       Patient will benefit from skilled therapeutic intervention in order to improve the following deficits and impairments:  Abnormal gait, Decreased activity tolerance, Decreased balance, Decreased coordination, Decreased endurance, Decreased knowledge of use of DME, Decreased range of motion, Decreased mobility, Decreased safety awareness, Decreased strength,  Difficulty walking, Hypomobility, Increased edema, Increased fascial restricitons, Increased muscle spasms, Impaired perceived functional ability, Impaired flexibility, Impaired sensation, Improper body mechanics, Postural dysfunction, Pain  Visit Diagnosis: Unsteadiness on feet  Muscle weakness (generalized)  Other lack of coordination  Difficulty in walking, not elsewhere classified     Problem List Patient Active Problem List   Diagnosis Date Noted  . History of bilateral hip replacements 05/28/2015  . Spinal stenosis, lumbar region, with neurogenic claudication 05/28/2015  . DDD (degenerative disc disease), lumbar 03/13/2015  . Lumbar post-laminectomy syndrome 03/13/2015  . Degenerative cervical disc 03/13/2015  . Status post bilateral total hip replacement 03/13/2015  . Sacroiliac joint dysfunction 03/13/2015  . History of surgery on upper extremity 03/13/2015  . DJD of shoulder 03/13/2015  . Rheumatoid arthritis ([place]) 02/17/2015    Alanson Puls, [place] DPT 06/27/2018, 8:44 AM  [place] MAIN [facility] SERVICES [address] [place], [place], [address] Phone: [phone]   Fax:  [phone]  Name: Revella Shelton MRN: [id number] Date of Birth: 27-Aug-1935

## 2018-06-29 ENCOUNTER — Ambulatory Visit: Payer: Medicare Other | Admitting: Physical Therapy

## 2018-07-04 ENCOUNTER — Encounter: Payer: Self-pay | Admitting: Physical Therapy

## 2018-07-04 ENCOUNTER — Ambulatory Visit: Payer: Medicare Other | Admitting: Physical Therapy

## 2018-07-04 DIAGNOSIS — M6281 Muscle weakness (generalized): Secondary | ICD-10-CM

## 2018-07-04 DIAGNOSIS — R278 Other lack of coordination: Secondary | ICD-10-CM

## 2018-07-04 DIAGNOSIS — R262 Difficulty in walking, not elsewhere classified: Secondary | ICD-10-CM

## 2018-07-04 DIAGNOSIS — R2681 Unsteadiness on feet: Secondary | ICD-10-CM | POA: Diagnosis not present

## 2018-07-04 NOTE — Therapy (Addendum)
[place] MAIN [facility] SERVICES [address] [place], [place], [address] Phone: [phone]   Fax:  [phone]  Physical Therapy Treatment  Patient Details  Name: Nyhla Singleton MRN: [id number] Date of Birth: Feb 05, 1935 Referring Provider: Dr. Jennings Books   Encounter Date: 07/04/2018  PT End of Session - 07/04/18 0816    Visit Number  20    Number of Visits  29    Date for PT Re-Evaluation  08/22/18    Authorization Type  10/10 progress note    PT Start Time  0805    PT Stop Time  0845    PT Time Calculation (min)  40 min    Equipment Utilized During Treatment  Gait belt    Activity Tolerance  Patient tolerated treatment well;No increased pain    Behavior During Therapy  WFL for tasks assessed/performed       Past Medical History:  Diagnosis Date  . Back pain   . Breast cancer ([place]) 2007   right breast lumpectomy with rad tx  . Cancer [facility]) 2007   right breast  . Collagen vascular disease ([place])   . Gout   . Hard of hearing   . Hypertension   . Parkinson's disease ([place])   . Personal history of radiation therapy 2007   F/U right breast cancer    Past Surgical History:  Procedure Laterality Date  . ABDOMINAL HYSTERECTOMY    . BREAST LUMPECTOMY Right 2007   suspicious calcs, f/u with radiation  . CATARACT EXTRACTION Bilateral   . ESOPHAGOGASTRODUODENOSCOPY (EGD) WITH PROPOFOL N/A 05/25/2017   Procedure: ESOPHAGOGASTRODUODENOSCOPY (EGD) WITH PROPOFOL;  Surgeon: Lollie Sails, MD;  Location: [facility] ENDOSCOPY;  Service: Endoscopy;  Laterality: N/A;  . ESOPHAGOGASTRODUODENOSCOPY (EGD) WITH PROPOFOL N/A 05/09/2018   Procedure: ESOPHAGOGASTRODUODENOSCOPY (EGD) WITH PROPOFOL;  Surgeon: Lin Landsman, MD;  Location: [facility] ENDOSCOPY;  Service: Gastroenterology;  Laterality: N/A;  . EYE SURGERY Bilateral   . HAND SURGERY    . HIP CLOSED REDUCTION Left 06/24/2015   Procedure: CLOSED REDUCTION HIP;  Surgeon: Dereck Leep, MD;   Location: [facility] ORS;  Service: Orthopedics;  Laterality: Left;  . HIP CLOSED REDUCTION Left 02/09/2016   Procedure: CLOSED MANIPULATION HIP;  Surgeon: Earnestine Leys, MD;  Location: [facility] ORS;  Service: Orthopedics;  Laterality: Left;  . HIP SURGERY    . JOINT REPLACEMENT     bilateral hip  . OOPHORECTOMY    . TOTAL KNEE ARTHROPLASTY Bilateral     There were no vitals filed for this visit.  Subjective Assessment - 07/04/18 0814    Subjective  Patient has her vascular appointment today at 3:00. Her back is hurting 4/10 today.    Pertinent History  Pt arrived late to appointment, limiting evaluation. Pt presents with reports of imbalance and weakness.  Pt reports she stopped exercising this past winter so her balance has gotten worse.  Pt has been using Bil crutches at all times for 3-4 years.  Pt uses crutches instead of a walker due to chronic back and Bil wrist pain (R>L) due to gout and RA. Pt denies any falls in the past 6 months but has fallen in the past. Pt has had some issues with dizziness when taking Sinemet and Neurontin together so she has separated her schedule of taking these per Dr. Sharlet Salina. Pt reports she has trouble with mopping, vacuuming, sweeping, walking, outside work (flowers, etc).  Goals: to improve with all of these and to be able  to do more in the kitchen.  Pt with h/o Bil TKA and THAs, back surgery. Pt reports her R THA dislocated x3 and L THA dislocated x2 but last time this happened was 3 years ago.  Pt with h/o osteoporosis and RA.    Pt reports numbness and tingling and pain down LLE.  Pt additionally has numbness and tingling RLE but not as worse as on the L side.  These symptoms have been present for over a year and is being followed by Dr. Sharlet Salina for this.  Pt denies any bowel or bladder issues, saddle anesthesia, night sweats, sudden changes in weight.  Pt reports chronic pain in wrists, neck, back, Bil hip, BLEs.  Current pain in back: 4/10, best pain: 2/10, worst pain:  6/10.     Limitations  Walking;House hold activities;Standing    How long can you sit comfortably?  no issues    How long can you stand comfortably?  10 minutes    How long can you walk comfortably?  15 minutes    Diagnostic tests  Pt had MRI lumbar spine 07/08/15: L5-S1 mild disc desiccation broad-based disc bulging extending to the R neural foramen, moderate to severe facet degenerative changes, moderate to severe R foraminal stenosis, L4-5 mild disc dessication broad-based disc bulging, moderate facet degenerative changes, moderate Bil lateral recess stenosis and mild to moderate central stenosis, mild Bil foraminal stenosis, at L3-4 there is severe degenerative disc disease, moderate to severe facet degenerative changes with mild central stenosis, mild Bil foraminal stenosis, at L2-3 there is severe degenerative disc disease, R hemilaminectomy defect, L1-2 mild disc dessication broad-based disc bulging. She has had periodic steroid injections with mild to moderate relief. Pt is followed at [facility] pain clinic for this. Plan is to schedule a L L5-S1 ESI if her pain worsens.    Patient Stated Goals  see above    Currently in Pain?  Yes    Pain Score  4     Pain Location  Back    Pain Orientation  Lower    Pain Descriptors / Indicators  Aching    Pain Onset  More than a month ago    Aggravating Factors   walking    Pain Relieving Factors  rest    Effect of Pain on Daily Activities  no effect    Multiple Pain Sites  No       Ther-ex Octane fitness  x 5 minutes during history (3 minutes unbilled); Standing marching with 2# ankle weightsx 15; Standing hip extension with2# ankle weights x 15; Standing hip abduction with 2# ankle weights x 15;  Standing HS curls 2# ankle weights x 15;  Neuromuscular Re-education Airex balance with toe taps to 6" step alternating LE x 10 each, faded UE support, pt able to perform with 2 finger support at the end of the set; Static balance on 1/2 bolster (flat  side up) with no UE support 30s x 2; Standing on 1/2 bolster (Flat side up)heel/toe rock x15 withno UEhold for balance but intermittent assist from therapist to prevent falls especially with heel rocking and posterior LOB      CGA and Min to mod verbal cues used throughout with increased in postural sway and LOB most seen with narrow base of support and while on uneven surfaces. Continues to have balance deficits typical with diagnosis. Patient performs intermediate level exercises without pain behaviors and needs verbal cuing for postural alignment  PT Education - 07/04/18 0816    Education Details  HEP    Person(s) Educated  Patient    Methods  Explanation;Demonstration;Verbal cues    Comprehension  Verbalized understanding;Returned demonstration       PT Short Term Goals - 06/27/18 0824      PT SHORT TERM GOAL #1   Title  Pt will independently complete HEP at least 4 days/wk for improved carryover between sessions    Time  2    Period  Weeks    Status  Achieved    Target Date  06/27/09        PT Long Term Goals - 06/27/18 0816      PT LONG TERM GOAL #1   Title  Pt's ABC scale will improve to at least 55% to demonstrate improvement in pt's perceived balance    Baseline  46% 06/07/18 , 06/27/18=  55%    Time  8    Period  Weeks    Status  Partially Met    Target Date  08/22/18      PT LONG TERM GOAL #2   Title  Pt's 5xSTS will improve to equal to or less than 15 seconds to demonstrates improved balance and strength    Baseline  26.47 seconds (pt pushing on thighs to stand)  05/08/18  23.20 sec 06/07/18=20.07 sec, 06/27/18= 20.05 sec    Time  8    Period  Weeks    Status  Partially Met    Target Date  08/22/18      PT LONG TERM GOAL #3   Title  Pt's Berg Balance Test will improve to at least 45/56 to demonstrate improved balance    Baseline  28/56:  05/08/18, 32/56 berg 06/07/18,   06/27/18= 34%     Time  8    Period  Weeks     Status  Partially Met    Target Date  08/22/18      PT LONG TERM GOAL #4   Title  Pt will improve 80mT time to at least 0.80 m/s for improved safety with ambulation in the community    Baseline  0.52 m/s using crutches: .68 m/sec, . 69 m/sec 06/07/18:   06/27/18= .75 m/sec    Time  8    Period  Weeks    Status  Partially Met    Target Date  08/22/18      PT LONG TERM GOAL #5   Title  Pt's TUG will improve to equal to or less than 13 seconds to demonstrate improved balance, gait speed, and strength    Baseline  22.47 seconds  05/08/18  20.30, 06/07/18= 18.63, 06/27/18= 17.96    Time  8    Period  Weeks    Status  Partially Met    Target Date  08/22/18            Plan - 07/04/18 0817    Clinical Impression Statement  Pt was progressed dynamic balance exercises today, noting improved postural reactions with LOB and moving outside normal BOS. Pt was able to perform all exercises on uneven surfaces with minimal LOB noted. Dynamic balance stability activities were progressed today, with decrease control noted when attempting to perform tasks with single leg activities. Pt would continue to benefit from skilled therapy services to address further balance impairments and decrease falls risk    Rehab Potential  Good    PT Frequency  2x / week    PT Duration  8 weeks    PT Treatment/Interventions  ADLs/Self Care Home Management;Aquatic Therapy;Biofeedback;Cryotherapy;Electrical Stimulation;Iontophoresis 68m/ml Dexamethasone;Moist Heat;Traction;Ultrasound;DME Instruction;Gait training;Stair training;Functional mobility training;Therapeutic activities;Therapeutic exercise;Neuromuscular re-education;Balance training;Patient/family education;Manual techniques;Orthotic Fit/Training;Dry needling;Passive range of motion;Compression bandaging;Energy conservation;Taping    PT Next Visit Plan  progress strengthening program and balance interventions    PT Home Exercise Plan  seated marching, sit<>stand,  standing hip Abd, standing hip E    Consulted and Agree with Plan of Care  Patient       Patient will benefit from skilled therapeutic intervention in order to improve the following deficits and impairments:  Abnormal gait, Decreased activity tolerance, Decreased balance, Decreased coordination, Decreased endurance, Decreased knowledge of use of DME, Decreased range of motion, Decreased mobility, Decreased safety awareness, Decreased strength, Difficulty walking, Hypomobility, Increased edema, Increased fascial restricitons, Increased muscle spasms, Impaired perceived functional ability, Impaired flexibility, Impaired sensation, Improper body mechanics, Postural dysfunction, Pain  Visit Diagnosis: Unsteadiness on feet  Muscle weakness (generalized)  Other lack of coordination  Difficulty in walking, not elsewhere classified     Problem List Patient Active Problem List   Diagnosis Date Noted  . Lower extremity edema 06/27/2018  . Hyperlipidemia 05/30/2017  . Hypertension 05/30/2017  . GERD (gastroesophageal reflux disease) 05/30/2017  . [place] 10/29/2016  . History of bilateral hip replacements 05/28/2015  . Spinal stenosis, lumbar region, with neurogenic claudication 05/28/2015  . DDD (degenerative disc disease), lumbar 03/13/2015  . Lumbar post-laminectomy syndrome 03/13/2015  . Degenerative cervical disc 03/13/2015  . Status post bilateral total hip replacement 03/13/2015  . Sacroiliac joint dysfunction 03/13/2015  . History of surgery on upper extremity 03/13/2015  . DJD of shoulder 03/13/2015  . [place] 02/17/2015    MAlanson Puls PVirginiaDPT 07/05/2018, 8:22 AM  [place] [facility] [address] [address]Phone: [phone]  Fax:  [phone] Name: GRenisha CockrumMRN: [id number] of Birth: [birth year]

## 2018-07-06 ENCOUNTER — Encounter: Payer: Self-pay | Admitting: Physical Therapy

## 2018-07-06 ENCOUNTER — Ambulatory Visit: Payer: Medicare Other | Admitting: Physical Therapy

## 2018-07-06 DIAGNOSIS — R2681 Unsteadiness on feet: Secondary | ICD-10-CM

## 2018-07-06 DIAGNOSIS — R262 Difficulty in walking, not elsewhere classified: Secondary | ICD-10-CM

## 2018-07-06 DIAGNOSIS — R278 Other lack of coordination: Secondary | ICD-10-CM

## 2018-07-06 DIAGNOSIS — M6281 Muscle weakness (generalized): Secondary | ICD-10-CM

## 2018-07-06 NOTE — Therapy (Signed)
[place] MAIN [facility] SERVICES [address] [place], [place], [address] Phone: [phone]   Fax:  [phone]  Physical Therapy Treatment  Patient Details  Name: Yvonne Singleton MRN: [id number] Date of Birth: [birth year] Referring Provider: Dr. Jennings Books   Encounter Date: 07/06/2018  PT End of Session - 07/06/18 0822    Visit Number  21    Number of Visits  29    Date for PT Re-Evaluation  08/22/18    Authorization Type  1/10 progress note    PT Start Time  0814    PT Stop Time  0845    PT Time Calculation (min)  31 min    Equipment Utilized During Treatment  Gait belt    Activity Tolerance  Patient tolerated treatment well;No increased pain    Behavior During Therapy  WFL for tasks assessed/performed       Past Medical History:  Diagnosis Date  . Back pain   . Breast cancer ([place]) 2007   right breast lumpectomy with rad tx  . Cancer [facility]) 2007   right breast  . Collagen vascular disease ([place])   . Gout   . Hard of hearing   . Hypertension   . Parkinson's disease ([place])   . Personal history of radiation therapy 2007   F/U right breast cancer    Past Surgical History:  Procedure Laterality Date  . ABDOMINAL HYSTERECTOMY    . BREAST LUMPECTOMY Right 2007   suspicious calcs, f/u with radiation  . CATARACT EXTRACTION Bilateral   . ESOPHAGOGASTRODUODENOSCOPY (EGD) WITH PROPOFOL N/A 05/25/2017   Procedure: ESOPHAGOGASTRODUODENOSCOPY (EGD) WITH PROPOFOL;  Surgeon: Lollie Sails, MD;  Location: [facility] ENDOSCOPY;  Service: Endoscopy;  Laterality: N/A;  . ESOPHAGOGASTRODUODENOSCOPY (EGD) WITH PROPOFOL N/A 05/09/2018   Procedure: ESOPHAGOGASTRODUODENOSCOPY (EGD) WITH PROPOFOL;  Surgeon: Lin Landsman, MD;  Location: [facility] ENDOSCOPY;  Service: Gastroenterology;  Laterality: N/A;  . EYE SURGERY Bilateral   . HAND SURGERY    . HIP CLOSED REDUCTION Left 06/24/2015   Procedure: CLOSED REDUCTION HIP;  Surgeon: Dereck Leep, MD;   Location: [facility] ORS;  Service: Orthopedics;  Laterality: Left;  . HIP CLOSED REDUCTION Left 02/09/2016   Procedure: CLOSED MANIPULATION HIP;  Surgeon: Earnestine Leys, MD;  Location: [facility] ORS;  Service: Orthopedics;  Laterality: Left;  . HIP SURGERY    . JOINT REPLACEMENT     bilateral hip  . OOPHORECTOMY    . TOTAL KNEE ARTHROPLASTY Bilateral     There were no vitals filed for this visit.  Subjective Assessment - 07/06/18 0821    Subjective  Patient has her cardiac MD appointment today at 3:00. Her back is hurting 4/10 today.    Pertinent History  Pt arrived late to appointment, limiting evaluation. Pt presents with reports of imbalance and weakness.  Pt reports she stopped exercising this past winter so her balance has gotten worse.  Pt has been using Bil crutches at all times for 3-4 years.  Pt uses crutches instead of a walker due to chronic back and Bil wrist pain (R>L) due to gout and RA. Pt denies any falls in the past 6 months but has fallen in the past. Pt has had some issues with dizziness when taking Sinemet and Neurontin together so she has separated her schedule of taking these per Dr. Sharlet Salina. Pt reports she has trouble with mopping, vacuuming, sweeping, walking, outside work (flowers, etc).  Goals: to improve with all of these and to be  able to do more in the kitchen.  Pt with h/o Bil TKA and THAs, back surgery. Pt reports her R THA dislocated x3 and L THA dislocated x2 but last time this happened was 3 years ago.  Pt with h/o osteoporosis and RA.    Pt reports numbness and tingling and pain down LLE.  Pt additionally has numbness and tingling RLE but not as worse as on the L side.  These symptoms have been present for over a year and is being followed by Dr. Sharlet Salina for this.  Pt denies any bowel or bladder issues, saddle anesthesia, night sweats, sudden changes in weight.  Pt reports chronic pain in wrists, neck, back, Bil hip, BLEs.  Current pain in back: 4/10, best pain: 2/10, worst  pain: 6/10.     Limitations  Walking;House hold activities;Standing    How long can you sit comfortably?  no issues    How long can you stand comfortably?  10 minutes    How long can you walk comfortably?  15 minutes    Diagnostic tests  Pt had MRI lumbar spine 07/08/15: L5-S1 mild disc desiccation broad-based disc bulging extending to the R neural foramen, moderate to severe facet degenerative changes, moderate to severe R foraminal stenosis, L4-5 mild disc dessication broad-based disc bulging, moderate facet degenerative changes, moderate Bil lateral recess stenosis and mild to moderate central stenosis, mild Bil foraminal stenosis, at L3-4 there is severe degenerative disc disease, moderate to severe facet degenerative changes with mild central stenosis, mild Bil foraminal stenosis, at L2-3 there is severe degenerative disc disease, R hemilaminectomy defect, L1-2 mild disc dessication broad-based disc bulging. She has had periodic steroid injections with mild to moderate relief. Pt is [facility] for [facility]. Plan is to schedule a L L5-S1 ESI if her pain worsens.    Patient Stated Goals  see above    Currently in Pain?  Yes    Pain Score  4     Pain Location  Back    Pain Orientation  Lower    Pain Descriptors / Indicators  Aching    Pain Onset  More than a month ago    Multiple Pain Sites  No        Therapeutic exercise:  Standing on 1/2 bolster (Flat side up): Heel/toe rock x15 with finger tip hold for balance with cues to keep knee straight for better ankle strategies;  Stepping over hurdle fwd/ bwd , side to side x 10   Feet in neutral: with head turns and reaching out of base of support  LAQ with 2 # BLE x 15 x 2 with 3 sec hold(reports easy so will increase weight next time)  Marching with 2 # x 15 x1BLE; pain when marching with RLE  Hip extension standing with kneeextx 15 BLE  Hip abd sidelying left and right x 15 BLE  Hooklying abd/ER x 20  with OTB   Hooklying marching withTrA activation15 x 2  Bridges x 10   Cues for core activation of TA in standing to reduce back pain.                        PT Education - 07/06/18 4627    Education Details  HEP, balance, saftey    Person(s) Educated  Patient    Methods  Explanation    Comprehension  Verbalized understanding       PT Short Term Goals - 06/27/18 0350  PT SHORT TERM GOAL #1   Title  Pt will independently complete HEP at least 4 days/wk for improved carryover between sessions    Time  2    Period  Weeks    Status  Achieved    Target Date  06/27/09        PT Long Term Goals - 06/27/18 0816      PT LONG TERM GOAL #1   Title  Pt's ABC scale will improve to at least 55% to demonstrate improvement in pt's perceived balance    Baseline  46% 06/07/18 , 06/27/18=  55%    Time  8    Period  Weeks    Status  Partially Met    Target Date  08/22/18      PT LONG TERM GOAL #2   Title  Pt's 5xSTS will improve to equal to or less than 15 seconds to demonstrates improved balance and strength    Baseline  26.47 seconds (pt pushing on thighs to stand)  05/08/18  23.20 sec 06/07/18=20.07 sec, 06/27/18= 20.05 sec    Time  8    Period  Weeks    Status  Partially Met    Target Date  08/22/18      PT LONG TERM GOAL #3   Title  Pt's Berg Balance Test will improve to at least 45/56 to demonstrate improved balance    Baseline  28/56:  05/08/18, 32/56 berg 06/07/18,   06/27/18= 34%     Time  8    Period  Weeks    Status  Partially Met    Target Date  08/22/18      PT LONG TERM GOAL #4   Title  Pt will improve 22mT time to at least 0.80 m/s for improved safety with ambulation in the community    Baseline  0.52 m/s using crutches: .68 m/sec, . 69 m/sec 06/07/18:   06/27/18= .75 m/sec    Time  8    Period  Weeks    Status  Partially Met    Target Date  08/22/18      PT LONG TERM GOAL #5   Title  Pt's TUG will improve to equal to or less than 13  seconds to demonstrate improved balance, gait speed, and strength    Baseline  22.47 seconds  05/08/18  20.30, 06/07/18= 18.63, 06/27/18= 17.96    Time  8    Period  Weeks    Status  Partially Met    Target Date  08/22/18            Plan - 07/06/18 0825    Clinical Impression Statement   Patient instructed in advanced dynamic and static balance exercise. Utilized stable and uneven surfaces to further challenge dynamic balance. Patient required min Vcs for correct positioning and exercise technique. Patient had a difficult time during  narrow base of support challenges.Patient exhibits better SLS ability being able to progress to foam; Patient would benefit from additional skilled PT Intervention to improve strength, balance and gait safety    Rehab Potential  Good    PT Frequency  2x / week    PT Duration  8 weeks    PT Treatment/Interventions  ADLs/Self Care Home Management;Aquatic Therapy;Biofeedback;Cryotherapy;Electrical Stimulation;Iontophoresis 438mml Dexamethasone;Moist Heat;Traction;Ultrasound;DME Instruction;Gait training;Stair training;Functional mobility training;Therapeutic activities;Therapeutic exercise;Neuromuscular re-education;Balance training;Patient/family education;Manual techniques;Orthotic Fit/Training;Dry needling;Passive range of motion;Compression bandaging;Energy conservation;Taping    PT Next Visit Plan  progress strengthening program and balance interventions    PT Home Exercise  Plan  seated marching, sit<>stand, standing hip Abd, standing hip E    Consulted and Agree with Plan of Care  Patient       Patient will benefit from skilled therapeutic intervention in order to improve the following deficits and impairments:  Abnormal gait, Decreased activity tolerance, Decreased balance, Decreased coordination, Decreased endurance, Decreased knowledge of use of DME, Decreased range of motion, Decreased mobility, Decreased safety awareness, Decreased strength, Difficulty  walking, Hypomobility, Increased edema, Increased fascial restricitons, Increased muscle spasms, Impaired perceived functional ability, Impaired flexibility, Impaired sensation, Improper body mechanics, Postural dysfunction, Pain  Visit Diagnosis: Unsteadiness on feet  Muscle weakness (generalized)  Other lack of coordination  Difficulty in walking, not elsewhere classified     Problem List Patient Active Problem List   Diagnosis Date Noted  . Lower extremity edema 06/27/2018  . Hyperlipidemia 05/30/2017  . Hypertension 05/30/2017  . GERD (gastroesophageal reflux disease) 05/30/2017  . Parkinson disease ([place]) 10/29/2016  . History of bilateral hip replacements 05/28/2015  . Spinal stenosis, lumbar region, with neurogenic claudication 05/28/2015  . DDD (degenerative disc disease), lumbar 03/13/2015  . Lumbar post-laminectomy syndrome 03/13/2015  . Degenerative cervical disc 03/13/2015  . Status post bilateral total hip replacement 03/13/2015  . Sacroiliac joint dysfunction 03/13/2015  . History of surgery on upper extremity 03/13/2015  . DJD of shoulder 03/13/2015  . Rheumatoid arthritis ([place]) 02/17/2015    Alanson Puls ,[place] DPT 07/06/2018, 8:27 AM  [place] MAIN [facility] SERVICES [address] [place], [place], [address] Phone: [phone]   Fax:  [phone]  Name: Yvonne Singleton MRN: [id number] Date of Birth: 1935-05-24

## 2018-07-11 ENCOUNTER — Ambulatory Visit: Payer: Medicare Other | Admitting: Physical Therapy

## 2018-07-13 ENCOUNTER — Ambulatory Visit: Payer: Medicare Other | Admitting: Physical Therapy

## 2018-07-18 ENCOUNTER — Ambulatory Visit: Payer: Medicare Other | Admitting: Physical Therapy

## 2018-07-20 ENCOUNTER — Ambulatory Visit: Payer: Medicare Other | Admitting: Physical Therapy

## 2018-07-24 DIAGNOSIS — H3530 Unspecified macular degeneration: Secondary | ICD-10-CM | POA: Insufficient documentation

## 2018-07-25 ENCOUNTER — Ambulatory Visit: Payer: Medicare Other | Admitting: Physical Therapy

## 2018-07-27 ENCOUNTER — Encounter: Payer: Self-pay | Admitting: Physical Therapy

## 2018-07-27 ENCOUNTER — Ambulatory Visit: Payer: Medicare Other | Attending: Neurology | Admitting: Physical Therapy

## 2018-07-27 DIAGNOSIS — M6281 Muscle weakness (generalized): Secondary | ICD-10-CM | POA: Insufficient documentation

## 2018-07-27 DIAGNOSIS — R262 Difficulty in walking, not elsewhere classified: Secondary | ICD-10-CM | POA: Diagnosis present

## 2018-07-27 DIAGNOSIS — R2681 Unsteadiness on feet: Secondary | ICD-10-CM | POA: Diagnosis not present

## 2018-07-27 DIAGNOSIS — R278 Other lack of coordination: Secondary | ICD-10-CM | POA: Insufficient documentation

## 2018-07-27 NOTE — Therapy (Signed)
[place] MAIN [facility] SERVICES [address] [place], [place], [address] Phone: [phone]   Fax:  [phone]  Physical Therapy Treatment  Patient Details  Name: Yvonne Singleton MRN: [id number] Date of Birth: 11-17-1934 Referring Provider (PT): Dr. Jennings Books   Encounter Date: 07/27/2018  PT End of Session - 07/27/18 0856    Visit Number  22    Number of Visits  29    Date for PT Re-Evaluation  08/22/18    Authorization Type  2/10 progress note    PT Start Time  0845    PT Stop Time  0925    PT Time Calculation (min)  40 min    Equipment Utilized During Treatment  Gait belt    Activity Tolerance  Patient tolerated treatment well;No increased pain    Behavior During Therapy  WFL for tasks assessed/performed       Past Medical History:  Diagnosis Date  . Back pain   . Breast cancer ([place]) 2007   right breast lumpectomy with rad tx  . Cancer [facility]) 2007   right breast  . Collagen vascular disease ([place])   . Gout   . Hard of hearing   . Hypertension   . Parkinson's disease ([place])   . Personal history of radiation therapy 2007   F/U right breast cancer    Past Surgical History:  Procedure Laterality Date  . ABDOMINAL HYSTERECTOMY    . BREAST LUMPECTOMY Right 2007   suspicious calcs, f/u with radiation  . CATARACT EXTRACTION Bilateral   . ESOPHAGOGASTRODUODENOSCOPY (EGD) WITH PROPOFOL N/A 05/25/2017   Procedure: ESOPHAGOGASTRODUODENOSCOPY (EGD) WITH PROPOFOL;  Surgeon: Lollie Sails, MD;  Location: [facility] ENDOSCOPY;  Service: Endoscopy;  Laterality: N/A;  . ESOPHAGOGASTRODUODENOSCOPY (EGD) WITH PROPOFOL N/A 05/09/2018   Procedure: ESOPHAGOGASTRODUODENOSCOPY (EGD) WITH PROPOFOL;  Surgeon: Lin Landsman, MD;  Location: [facility] ENDOSCOPY;  Service: Gastroenterology;  Laterality: N/A;  . EYE SURGERY Bilateral   . HAND SURGERY    . HIP CLOSED REDUCTION Left 06/24/2015   Procedure: CLOSED REDUCTION HIP;  Surgeon: Dereck Leep, MD;  Location: [facility] ORS;  Service: Orthopedics;  Laterality: Left;  . HIP CLOSED REDUCTION Left 02/09/2016   Procedure: CLOSED MANIPULATION HIP;  Surgeon: Earnestine Leys, MD;  Location: [facility] ORS;  Service: Orthopedics;  Laterality: Left;  . HIP SURGERY    . JOINT REPLACEMENT     bilateral hip  . OOPHORECTOMY    . TOTAL KNEE ARTHROPLASTY Bilateral     There were no vitals filed for this visit.  Subjective Assessment - 07/27/18 0854    Subjective  Patient is doing well today. She is wearing support hose and her ankle swelling is getting better.     Pertinent History  Pt arrived late to appointment, limiting evaluation. Pt presents with reports of imbalance and weakness.  Pt reports she stopped exercising this past winter so her balance has gotten worse.  Pt has been using Bil crutches at all times for 3-4 years.  Pt uses crutches instead of a walker due to chronic back and Bil wrist pain (R>L) due to gout and RA. Pt denies any falls in the past 6 months but has fallen in the past. Pt has had some issues with dizziness when taking Sinemet and Neurontin together so she has separated her schedule of taking these per Dr. Sharlet Salina. Pt reports she has trouble with mopping, vacuuming, sweeping, walking, outside work (flowers, etc).  Goals: to improve with all of  these and to be able to do more in the kitchen.  Pt with h/o Bil TKA and THAs, back surgery. Pt reports her R THA dislocated x3 and L THA dislocated x2 but last time this happened was 3 years ago.  Pt with h/o osteoporosis and RA.    Pt reports numbness and tingling and pain down LLE.  Pt additionally has numbness and tingling RLE but not as worse as on the L side.  These symptoms have been present for over a year and is being followed by Dr. Sharlet Salina for this.  Pt denies any bowel or bladder issues, saddle anesthesia, night sweats, sudden changes in weight.  Pt reports chronic pain in wrists, neck, back, Bil hip, BLEs.  Current pain in back: 4/10,  best pain: 2/10, worst pain: 6/10.     Limitations  Walking;House hold activities;Standing    How long can you sit comfortably?  no issues    How long can you stand comfortably?  10 minutes    How long can you walk comfortably?  15 minutes    Diagnostic tests  Pt had MRI lumbar spine 07/08/15: L5-S1 mild disc desiccation broad-based disc bulging extending to the R neural foramen, moderate to severe facet degenerative changes, moderate to severe R foraminal stenosis, L4-5 mild disc dessication broad-based disc bulging, moderate facet degenerative changes, moderate Bil lateral recess stenosis and mild to moderate central stenosis, mild Bil foraminal stenosis, at L3-4 there is severe degenerative disc disease, moderate to severe facet degenerative changes with mild central stenosis, mild Bil foraminal stenosis, at L2-3 there is severe degenerative disc disease, R hemilaminectomy defect, L1-2 mild disc dessication broad-based disc bulging. She has had periodic steroid injections with mild to moderate relief. Pt is followed at [facility] pain clinic for this. Plan is to schedule a L L5-S1 ESI if her pain worsens.    Patient Stated Goals  see above    Currently in Pain?  No/denies    Pain Score  0-No pain    Pain Onset  More than a month ago         Therapeutic exercise:  Standing on 1/2 bolster (Flat side up): Heel/toe rock x15 with finger tip hold for balance with cues to keep knee straight for better ankle strategies;  Stepping over hurdle fwd/ bwd , side to side x 10  Standing hip abd without resistance due to weakness  Standing hip extension with GTB x 15 BLE   Squats x 15 with 3 sec hold  Marching with 2 # x 15 x1BLE; pain when marching with RLE  Hip abd sidelying left and right x 15 BLE  Hooklying abd/ER x 20 with OTB  Hooklying marching withTrA activation15 x 2  Bridges x 10   Cues for core activationof TAin standing to reduce  backpain.                       PT Education - 07/27/18 0855    Education Details  HEP    Person(s) Educated  Patient    Methods  Explanation    Comprehension  Verbalized understanding;Returned demonstration;Need further instruction       PT Short Term Goals - 06/27/18 1962      PT SHORT TERM GOAL #1   Title  Pt will independently complete HEP at least 4 days/wk for improved carryover between sessions    Time  2    Period  Weeks    Status  Achieved  Target Date  06/27/09        PT Long Term Goals - 06/27/18 0816      PT LONG TERM GOAL #1   Title  Pt's ABC scale will improve to at least 55% to demonstrate improvement in pt's perceived balance    Baseline  46% 06/07/18 , 06/27/18=  55%    Time  8    Period  Weeks    Status  Partially Met    Target Date  08/22/18      PT LONG TERM GOAL #2   Title  Pt's 5xSTS will improve to equal to or less than 15 seconds to demonstrates improved balance and strength    Baseline  26.47 seconds (pt pushing on thighs to stand)  05/08/18  23.20 sec 06/07/18=20.07 sec, 06/27/18= 20.05 sec    Time  8    Period  Weeks    Status  Partially Met    Target Date  08/22/18      PT LONG TERM GOAL #3   Title  Pt's Berg Balance Test will improve to at least 45/56 to demonstrate improved balance    Baseline  28/56:  05/08/18, 32/56 berg 06/07/18,   06/27/18= 34%     Time  8    Period  Weeks    Status  Partially Met    Target Date  08/22/18      PT LONG TERM GOAL #4   Title  Pt will improve 79mT time to at least 0.80 m/s for improved safety with ambulation in the community    Baseline  0.52 m/s using crutches: .68 m/sec, . 69 m/sec 06/07/18:   06/27/18= .75 m/sec    Time  8    Period  Weeks    Status  Partially Met    Target Date  08/22/18      PT LONG TERM GOAL #5   Title  Pt's TUG will improve to equal to or less than 13 seconds to demonstrate improved balance, gait speed, and strength    Baseline  22.47 seconds  05/08/18   20.30, 06/07/18= 18.63, 06/27/18= 17.96    Time  8    Period  Weeks    Status  Partially Met    Target Date  08/22/18            Plan - 07/27/18 0856    Clinical Impression Statement  Pt was able to progress dynamic balance exercises today, noting improved postural reactions with LOB and moving outside normal BOS. Pt was able to perform all exercises on uneven surfaces with minimal LOB noted. Dynamic balance stability activities were progressed today, with decrease control noted when attempting to perform tasks with single leg activities. Pt would continue to benefit from skilled therapy services to address further balance impairments and decrease falls risk.    Rehab Potential  Good    PT Frequency  2x / week    PT Duration  8 weeks    PT Treatment/Interventions  ADLs/Self Care Home Management;Aquatic Therapy;Biofeedback;Cryotherapy;Electrical Stimulation;Iontophoresis 437mml Dexamethasone;Moist Heat;Traction;Ultrasound;DME Instruction;Gait training;Stair training;Functional mobility training;Therapeutic activities;Therapeutic exercise;Neuromuscular re-education;Balance training;Patient/family education;Manual techniques;Orthotic Fit/Training;Dry needling;Passive range of motion;Compression bandaging;Energy conservation;Taping    PT Next Visit Plan  progress strengthening program and balance interventions    PT Home Exercise Plan  seated marching, sit<>stand, standing hip Abd, standing hip E    Consulted and Agree with Plan of Care  Patient       Patient will benefit from skilled therapeutic intervention in order to  improve the following deficits and impairments:  Abnormal gait, Decreased activity tolerance, Decreased balance, Decreased coordination, Decreased endurance, Decreased knowledge of use of DME, Decreased range of motion, Decreased mobility, Decreased safety awareness, Decreased strength, Difficulty walking, Hypomobility, Increased edema, Increased fascial restricitons, Increased  muscle spasms, Impaired perceived functional ability, Impaired flexibility, Impaired sensation, Improper body mechanics, Postural dysfunction, Pain  Visit Diagnosis: Unsteadiness on feet  Muscle weakness (generalized)  Other lack of coordination  Difficulty in walking, not elsewhere classified     Problem List Patient Active Problem List   Diagnosis Date Noted  . Lower extremity edema 06/27/2018  . Hyperlipidemia 05/30/2017  . Hypertension 05/30/2017  . GERD (gastroesophageal reflux disease) 05/30/2017  . Parkinson disease ([place]) 10/29/2016  . History of bilateral hip replacements 05/28/2015  . Spinal stenosis, lumbar region, with neurogenic claudication 05/28/2015  . DDD (degenerative disc disease), lumbar 03/13/2015  . Lumbar post-laminectomy syndrome 03/13/2015  . Degenerative cervical disc 03/13/2015  . Status post bilateral total hip replacement 03/13/2015  . Sacroiliac joint dysfunction 03/13/2015  . History of surgery on upper extremity 03/13/2015  . DJD of shoulder 03/13/2015  . Rheumatoid arthritis ([place]) 02/17/2015    Alanson Puls, PT DPT 07/27/2018, 9:11 AM  [place] MAIN [facility] SERVICES [address] [place], [place], [address] Phone: [phone]   Fax:  [phone]  Name: Zeynep Fantroy MRN: [id number] Date of Birth: Dec 27, 1934

## 2018-07-28 ENCOUNTER — Encounter (INDEPENDENT_AMBULATORY_CARE_PROVIDER_SITE_OTHER): Payer: Medicare Other

## 2018-07-28 ENCOUNTER — Ambulatory Visit (INDEPENDENT_AMBULATORY_CARE_PROVIDER_SITE_OTHER): Payer: Medicare Other | Admitting: Vascular Surgery

## 2018-08-01 ENCOUNTER — Ambulatory Visit: Payer: Medicare Other | Admitting: Physical Therapy

## 2018-08-01 ENCOUNTER — Encounter: Payer: Self-pay | Admitting: Physical Therapy

## 2018-08-01 DIAGNOSIS — R2681 Unsteadiness on feet: Secondary | ICD-10-CM

## 2018-08-01 DIAGNOSIS — R278 Other lack of coordination: Secondary | ICD-10-CM

## 2018-08-01 DIAGNOSIS — R262 Difficulty in walking, not elsewhere classified: Secondary | ICD-10-CM

## 2018-08-01 DIAGNOSIS — M6281 Muscle weakness (generalized): Secondary | ICD-10-CM

## 2018-08-01 NOTE — Therapy (Signed)
[place] MAIN [facility] SERVICES [address] [place], [place], [address] Phone: [phone]   Fax:  [phone]  Physical Therapy Treatment  Patient Details  Name: Yvonne Singleton MRN: [id number] Date of Birth: Oct 30, 1934 Referring Provider (PT): Dr. Jennings Books   Encounter Date: 08/01/2018  PT End of Session - 08/01/18 0902    Visit Number  23    Number of Visits  29    Date for PT Re-Evaluation  08/22/18    Authorization Type  3/10 progress note    PT Start Time  0845    PT Stop Time  0930    PT Time Calculation (min)  45 min    Equipment Utilized During Treatment  Gait belt    Activity Tolerance  Patient tolerated treatment well;No increased pain    Behavior During Therapy  WFL for tasks assessed/performed       Past Medical History:  Diagnosis Date  . Back pain   . Breast cancer ([place]) 2007   right breast lumpectomy with rad tx  . Cancer [facility]) 2007   right breast  . Collagen vascular disease ([place])   . Gout   . Hard of hearing   . Hypertension   . Parkinson's disease ([place])   . Personal history of radiation therapy 2007   F/U right breast cancer    Past Surgical History:  Procedure Laterality Date  . ABDOMINAL HYSTERECTOMY    . BREAST LUMPECTOMY Right 2007   suspicious calcs, f/u with radiation  . CATARACT EXTRACTION Bilateral   . ESOPHAGOGASTRODUODENOSCOPY (EGD) WITH PROPOFOL N/A 05/25/2017   Procedure: ESOPHAGOGASTRODUODENOSCOPY (EGD) WITH PROPOFOL;  Surgeon: Lollie Sails, MD;  Location: [facility] ENDOSCOPY;  Service: Endoscopy;  Laterality: N/A;  . ESOPHAGOGASTRODUODENOSCOPY (EGD) WITH PROPOFOL N/A 05/09/2018   Procedure: ESOPHAGOGASTRODUODENOSCOPY (EGD) WITH PROPOFOL;  Surgeon: Lin Landsman, MD;  Location: [facility] ENDOSCOPY;  Service: Gastroenterology;  Laterality: N/A;  . EYE SURGERY Bilateral   . HAND SURGERY    . HIP CLOSED REDUCTION Left 06/24/2015   Procedure: CLOSED REDUCTION HIP;  Surgeon: Dereck Leep, MD;  Location: [facility] ORS;  Service: Orthopedics;  Laterality: Left;  . HIP CLOSED REDUCTION Left 02/09/2016   Procedure: CLOSED MANIPULATION HIP;  Surgeon: Earnestine Leys, MD;  Location: [facility] ORS;  Service: Orthopedics;  Laterality: Left;  . HIP SURGERY    . JOINT REPLACEMENT     bilateral hip  . OOPHORECTOMY    . TOTAL KNEE ARTHROPLASTY Bilateral     There were no vitals filed for this visit.  Subjective Assessment - 08/01/18 0900    Subjective  Patient is doing well today. She is wearing support hose and her ankle swelling is getting better.     Pertinent History  Pt arrived late to appointment, limiting evaluation. Pt presents with reports of imbalance and weakness.  Pt reports she stopped exercising this past winter so her balance has gotten worse.  Pt has been using Bil crutches at all times for 3-4 years.  Pt uses crutches instead of a walker due to chronic back and Bil wrist pain (R>L) due to gout and RA. Pt denies any falls in the past 6 months but has fallen in the past. Pt has had some issues with dizziness when taking Sinemet and Neurontin together so she has separated her schedule of taking these per Dr. Sharlet Salina. Pt reports she has trouble with mopping, vacuuming, sweeping, walking, outside work (flowers, etc).  Goals: to improve with all of  these and to be able to do more in the kitchen.  Pt with h/o Bil TKA and THAs, back surgery. Pt reports her R THA dislocated x3 and L THA dislocated x2 but last time this happened was 3 years ago.  Pt with h/o osteoporosis and RA.    Pt reports numbness and tingling and pain down LLE.  Pt additionally has numbness and tingling RLE but not as worse as on the L side.  These symptoms have been present for over a year and is being followed by Dr. Sharlet Salina for this.  Pt denies any bowel or bladder issues, saddle anesthesia, night sweats, sudden changes in weight.  Pt reports chronic pain in wrists, neck, back, Bil hip, BLEs.  Current pain in back: 4/10,  best pain: 2/10, worst pain: 6/10.     Limitations  Walking;House hold activities;Standing    How long can you sit comfortably?  no issues    How long can you stand comfortably?  10 minutes    How long can you walk comfortably?  15 minutes    Diagnostic tests  Pt had MRI lumbar spine 07/08/15: L5-S1 mild disc desiccation broad-based disc bulging extending to the R neural foramen, moderate to severe facet degenerative changes, moderate to severe R foraminal stenosis, L4-5 mild disc dessication broad-based disc bulging, moderate facet degenerative changes, moderate Bil lateral recess stenosis and mild to moderate central stenosis, mild Bil foraminal stenosis, at L3-4 there is severe degenerative disc disease, moderate to severe facet degenerative changes with mild central stenosis, mild Bil foraminal stenosis, at L2-3 there is severe degenerative disc disease, R hemilaminectomy defect, L1-2 mild disc dessication broad-based disc bulging. She has had periodic steroid injections with mild to moderate relief. Pt is followed at [facility] pain clinic for this. Plan is to schedule a L L5-S1 ESI if her pain worsens.    Patient Stated Goals  see above    Currently in Pain?  Yes    Pain Score  4     Pain Location  Back    Pain Orientation  Lower    Pain Descriptors / Indicators  Aching    Pain Onset  More than a month ago       Therapeutic exercise:  Standing on 1/2 bolster (Flat side up): Heel/toe rock x15 with finger tip hold for balance with cues to keep knee straight for better ankle strategies;  Stepping over hurdle fwd/ bwd , side to side x 10  Standing hip abd without resistance due to weakness  Standing hip extension with GTB x 15 BLE   Squats x 15 with 3 sec hold  Marching with 2 # x 15 x1BLE; pain when marching with RLE  Hip abd sidelying left and right x 15 BLE  Hooklying abd/ER x 20 with OTB  Hooklying marching withTrA activation15 x 2  Bridges x 10   Cues for  core activationof TAin standing to reduce backpain.                        PT Education - 08/01/18 0901    Education Details  HEP    Person(s) Educated  Patient    Methods  Explanation    Comprehension  Verbalized understanding;Returned demonstration;Need further instruction       PT Short Term Goals - 06/27/18 9323      PT SHORT TERM GOAL #1   Title  Pt will independently complete HEP at least 4 days/wk for improved  carryover between sessions    Time  2    Period  Weeks    Status  Achieved    Target Date  06/27/09        PT Long Term Goals - 06/27/18 0816      PT LONG TERM GOAL #1   Title  Pt's ABC scale will improve to at least 55% to demonstrate improvement in pt's perceived balance    Baseline  46% 06/07/18 , 06/27/18=  55%    Time  8    Period  Weeks    Status  Partially Met    Target Date  08/22/18      PT LONG TERM GOAL #2   Title  Pt's 5xSTS will improve to equal to or less than 15 seconds to demonstrates improved balance and strength    Baseline  26.47 seconds (pt pushing on thighs to stand)  05/08/18  23.20 sec 06/07/18=20.07 sec, 06/27/18= 20.05 sec    Time  8    Period  Weeks    Status  Partially Met    Target Date  08/22/18      PT LONG TERM GOAL #3   Title  Pt's Berg Balance Test will improve to at least 45/56 to demonstrate improved balance    Baseline  28/56:  05/08/18, 32/56 berg 06/07/18,   06/27/18= 34%     Time  8    Period  Weeks    Status  Partially Met    Target Date  08/22/18      PT LONG TERM GOAL #4   Title  Pt will improve 65mT time to at least 0.80 m/s for improved safety with ambulation in the community    Baseline  0.52 m/s using crutches: .68 m/sec, . 69 m/sec 06/07/18:   06/27/18= .75 m/sec    Time  8    Period  Weeks    Status  Partially Met    Target Date  08/22/18      PT LONG TERM GOAL #5   Title  Pt's TUG will improve to equal to or less than 13 seconds to demonstrate improved balance, gait speed, and  strength    Baseline  22.47 seconds  05/08/18  20.30, 06/07/18= 18.63, 06/27/18= 17.96    Time  8    Period  Weeks    Status  Partially Met    Target Date  08/22/18            Plan - 08/01/18 0903    Clinical Impression Statement  Pt was able to progress dynamic balance exercises today, noting improved postural reactions with LOB and moving outside normal BOS.  Pt was able to perform all exercises on uneven surfaces with minimal LOB noted.  Single limb stability activities were added today, with decrease control noted when attempting to perform tasks with the R > L.  Pt would continue to benefit from skilled therapy services to address further balance impairments and decrease falls risk.    Rehab Potential  Good    PT Frequency  2x / week    PT Duration  8 weeks    PT Treatment/Interventions  ADLs/Self Care Home Management;Aquatic Therapy;Biofeedback;Cryotherapy;Electrical Stimulation;Iontophoresis 487mml Dexamethasone;Moist Heat;Traction;Ultrasound;DME Instruction;Gait training;Stair training;Functional mobility training;Therapeutic activities;Therapeutic exercise;Neuromuscular re-education;Balance training;Patient/family education;Manual techniques;Orthotic Fit/Training;Dry needling;Passive range of motion;Compression bandaging;Energy conservation;Taping    PT Next Visit Plan  progress strengthening program and balance interventions    PT Home Exercise Plan  seated marching, sit<>stand, standing hip Abd, standing hip E  Consulted and Agree with Plan of Care  Patient       Patient will benefit from skilled therapeutic intervention in order to improve the following deficits and impairments:  Abnormal gait, Decreased activity tolerance, Decreased balance, Decreased coordination, Decreased endurance, Decreased knowledge of use of DME, Decreased range of motion, Decreased mobility, Decreased safety awareness, Decreased strength, Difficulty walking, Hypomobility, Increased edema, Increased  fascial restricitons, Increased muscle spasms, Impaired perceived functional ability, Impaired flexibility, Impaired sensation, Improper body mechanics, Postural dysfunction, Pain  Visit Diagnosis: Unsteadiness on feet  Muscle weakness (generalized)  Other lack of coordination  Difficulty in walking, not elsewhere classified     Problem List Patient Active Problem List   Diagnosis Date Noted  . Lower extremity edema 06/27/2018  . Hyperlipidemia 05/30/2017  . Hypertension 05/30/2017  . GERD (gastroesophageal reflux disease) 05/30/2017  . Parkinson disease ([place]) 10/29/2016  . History of bilateral hip replacements 05/28/2015  . Spinal stenosis, lumbar region, with neurogenic claudication 05/28/2015  . DDD (degenerative disc disease), lumbar 03/13/2015  . Lumbar post-laminectomy syndrome 03/13/2015  . Degenerative cervical disc 03/13/2015  . Status post bilateral total hip replacement 03/13/2015  . Sacroiliac joint dysfunction 03/13/2015  . History of surgery on upper extremity 03/13/2015  . DJD of shoulder 03/13/2015  . Rheumatoid arthritis ([place]) 02/17/2015    Alanson Puls, [place] DPT 08/01/2018, 9:06 AM  [place] MAIN [facility] SERVICES [address] [place], [place], [address] Phone: [phone]   Fax:  [phone]  Name: Yvonne Singleton MRN: [id number] Date of Birth: 01/13/1935

## 2018-08-03 ENCOUNTER — Ambulatory Visit: Payer: Medicare Other

## 2018-08-03 ENCOUNTER — Encounter: Payer: Self-pay | Admitting: Physical Therapy

## 2018-08-03 DIAGNOSIS — R278 Other lack of coordination: Secondary | ICD-10-CM

## 2018-08-03 DIAGNOSIS — R2681 Unsteadiness on feet: Secondary | ICD-10-CM | POA: Diagnosis not present

## 2018-08-03 DIAGNOSIS — M6281 Muscle weakness (generalized): Secondary | ICD-10-CM

## 2018-08-03 DIAGNOSIS — R262 Difficulty in walking, not elsewhere classified: Secondary | ICD-10-CM

## 2018-08-03 NOTE — Therapy (Signed)
[place] MAIN [facility] SERVICES [address] [place], [place], [address] Phone: [phone]   Fax:  [phone]  Physical Therapy Treatment  Patient Details  Name: Yvonne Singleton MRN: [id number] Date of Birth: June 02, 1935 Referring Provider (PT): Dr. Jennings Books   Encounter Date: 08/03/2018  PT End of Session - 08/03/18 0842    Visit Number  24    Number of Visits  29    Date for PT Re-Evaluation  08/22/18    Authorization Type  4/10 progress note    PT Start Time  0845    PT Stop Time  0915    PT Time Calculation (min)  30 min    Equipment Utilized During Treatment  Gait belt    Activity Tolerance  Patient tolerated treatment well;No increased pain    Behavior During Therapy  WFL for tasks assessed/performed       Past Medical History:  Diagnosis Date  . Back pain   . Breast cancer ([place]) 2007   right breast lumpectomy with rad tx  . Cancer [facility]) 2007   right breast  . Collagen vascular disease ([place])   . Gout   . Hard of hearing   . Hypertension   . Parkinson's disease ([place])   . Personal history of radiation therapy 2007   F/U right breast cancer    Past Surgical History:  Procedure Laterality Date  . ABDOMINAL HYSTERECTOMY    . BREAST LUMPECTOMY Right 2007   suspicious calcs, f/u with radiation  . CATARACT EXTRACTION Bilateral   . ESOPHAGOGASTRODUODENOSCOPY (EGD) WITH PROPOFOL N/A 05/25/2017   Procedure: ESOPHAGOGASTRODUODENOSCOPY (EGD) WITH PROPOFOL;  Surgeon: Lollie Sails, MD;  Location[facility] ENDOSCOPY;  Service: Endoscopy;  Laterality: N/A;  . ESOPHAGOGASTRODUODENOSCOPY (EGD) WITH PROPOFOL N/A 05/09/2018   Procedure: ESOPHAGOGASTRODUODENOSCOPY (EGD) WITH PROPOFOL;  Surgeon: Lin Landsman, MD;  Location: [facility] ENDOSCOPY;  Service: Gastroenterology;  Laterality: N/A;  . EYE SURGERY Bilateral   . HAND SURGERY    . HIP CLOSED REDUCTION Left 06/24/2015   Procedure: CLOSED REDUCTION HIP;  Surgeon: Dereck Leep, MD;  Location: [facility] ORS;  Service: Orthopedics;  Laterality: Left;  . HIP CLOSED REDUCTION Left 02/09/2016   Procedure: CLOSED MANIPULATION HIP;  Surgeon: Earnestine Leys, MD;  Location: [facility] ORS;  Service: Orthopedics;  Laterality: Left;  . HIP SURGERY    . JOINT REPLACEMENT     bilateral hip  . OOPHORECTOMY    . TOTAL KNEE ARTHROPLASTY Bilateral     There were no vitals filed for this visit.  Subjective Assessment - 08/03/18 0849    Subjective  Patient reports she has chronic pain in her back, states her pain is at her usual, 4/10. Reports no falls or stumbles since last session.    Pertinent History  Pt arrived late to appointment, limiting evaluation. Pt presents with reports of imbalance and weakness.  Pt reports she stopped exercising this past winter so her balance has gotten worse.  Pt has been using Bil crutches at all times for 3-4 years.  Pt uses crutches instead of a walker due to chronic back and Bil wrist pain (R>L) due to gout and RA. Pt denies any falls in the past 6 months but has fallen in the past. Pt has had some issues with dizziness when taking Sinemet and Neurontin together so she has separated her schedule of taking these per Dr. Sharlet Salina. Pt reports she has trouble with mopping, vacuuming, sweeping, walking, outside work (flowers, etc).  Goals: to improve with all of these and to be able to do more in the kitchen.  Pt with h/o Bil TKA and THAs, back surgery. Pt reports her R THA dislocated x3 and L THA dislocated x2 but last time this happened was 3 years ago.  Pt with h/o osteoporosis and RA.    Pt reports numbness and tingling and pain down LLE.  Pt additionally has numbness and tingling RLE but not as worse as on the L side.  These symptoms have been present for over a year and is being followed by Dr. Sharlet Salina for this.  Pt denies any bowel or bladder issues, saddle anesthesia, night sweats, sudden changes in weight.  Pt reports chronic pain in wrists, neck, back, Bil  hip, BLEs.  Current pain in back: 4/10, best pain: 2/10, worst pain: 6/10.     Limitations  Walking;House hold activities;Standing    How long can you sit comfortably?  no issues    How long can you stand comfortably?  10 minutes    How long can you walk comfortably?  15 minutes    Diagnostic tests  Pt had MRI lumbar spine 07/08/15: L5-S1 mild disc desiccation broad-based disc bulging extending to the R neural foramen, moderate to severe facet degenerative changes, moderate to severe R foraminal stenosis, L4-5 mild disc dessication broad-based disc bulging, moderate facet degenerative changes, moderate Bil lateral recess stenosis and mild to moderate central stenosis, mild Bil foraminal stenosis, at L3-4 there is severe degenerative disc disease, moderate to severe facet degenerative changes with mild central stenosis, mild Bil foraminal stenosis, at L2-3 there is severe degenerative disc disease, R hemilaminectomy defect, L1-2 mild disc dessication broad-based disc bulging. She has had periodic steroid injections with mild to moderate relief. Pt is followed at [facility] pain clinic for this. Plan is to schedule a L L5-S1 ESI if her pain worsens.    Currently in Pain?  Yes    Pain Score  4     Pain Location  Back    Pain Orientation  Lower    Pain Descriptors / Indicators  Aching    Pain Type  Chronic pain    Pain Onset  More than a month ago       Therapeutic exercise:  nustep level 3 for warm up (unbilled) x62mns  Standing on 1/2 bolster (Flat side up): Heel/toe rock 2x10 with finger tip hold for balance with cues to keep knee straight for better ankle strategies;  Stepping over 2 hurdle fwd side to side x 10 Stepping over 1 hurdle bwd x10 cues fo rposture, increased hip flexion  Standing hip abd without resistance due to weakness 2x5 bilaterally  Standing hip extension without GTB due to increased fatigue today 2x5 BLE    Cues for core activationof TAin standing to reduce backpain  and for improved posture. Patient needed several sitting rest breaks during session.     PT Education - 08/03/18 0851    Education Details  therex technique/form    Person(s) Educated  Patient    Methods  Explanation    Comprehension  Verbalized understanding;Returned demonstration;Verbal cues required       PT Short Term Goals - 06/27/18 0824      PT SHORT TERM GOAL #1   Title  Pt will independently complete HEP at least 4 days/wk for improved carryover between sessions    Time  2    Period  Weeks    Status  Achieved  Target Date  06/27/09        PT Long Term Goals - 06/27/18 0816      PT LONG TERM GOAL #1   Title  Pt's ABC scale will improve to at least 55% to demonstrate improvement in pt's perceived balance    Baseline  46% 06/07/18 , 06/27/18=  55%    Time  8    Period  Weeks    Status  Partially Met    Target Date  08/22/18      PT LONG TERM GOAL #2   Title  Pt's 5xSTS will improve to equal to or less than 15 seconds to demonstrates improved balance and strength    Baseline  26.47 seconds (pt pushing on thighs to stand)  05/08/18  23.20 sec 06/07/18=20.07 sec, 06/27/18= 20.05 sec    Time  8    Period  Weeks    Status  Partially Met    Target Date  08/22/18      PT LONG TERM GOAL #3   Title  Pt's Berg Balance Test will improve to at least 45/56 to demonstrate improved balance    Baseline  28/56:  05/08/18, 32/56 berg 06/07/18,   06/27/18= 34%     Time  8    Period  Weeks    Status  Partially Met    Target Date  08/22/18      PT LONG TERM GOAL #4   Title  Pt will improve 59mT time to at least 0.80 m/s for improved safety with ambulation in the community    Baseline  0.52 m/s using crutches: .68 m/sec, . 69 m/sec 06/07/18:   06/27/18= .75 m/sec    Time  8    Period  Weeks    Status  Partially Met    Target Date  08/22/18      PT LONG TERM GOAL #5   Title  Pt's TUG will improve to equal to or less than 13 seconds to demonstrate improved balance, gait speed, and  strength    Baseline  22.47 seconds  05/08/18  20.30, 06/07/18= 18.63, 06/27/18= 17.96    Time  8    Period  Weeks    Status  Partially Met    Target Date  08/22/18            Plan - 08/03/18 04098   Clinical Impression Statement  Patient requested for short session due to an appt after PT today, focused on dynamic balance activities. Patient needed verbal and visual cues for posture and hip flexion to allow improved foot clearance. Patient reported fatiue at end of session.     Rehab Potential  Good    PT Frequency  2x / week    PT Duration  8 weeks    PT Treatment/Interventions  ADLs/Self Care Home Management;Aquatic Therapy;Biofeedback;Cryotherapy;Electrical Stimulation;Iontophoresis 422mml Dexamethasone;Moist Heat;Traction;Ultrasound;DME Instruction;Gait training;Stair training;Functional mobility training;Therapeutic activities;Therapeutic exercise;Neuromuscular re-education;Balance training;Patient/family education;Manual techniques;Orthotic Fit/Training;Dry needling;Passive range of motion;Compression bandaging;Energy conservation;Taping    PT Next Visit Plan  progress strengthening program and balance interventions    PT Home Exercise Plan  seated marching, sit<>stand, standing hip Abd, standing hip E    Consulted and Agree with Plan of Care  Patient       Patient will benefit from skilled therapeutic intervention in order to improve the following deficits and impairments:  Abnormal gait, Decreased activity tolerance, Decreased balance, Decreased coordination, Decreased endurance, Decreased knowledge of use of DME, Decreased range of motion, Decreased mobility, Decreased  safety awareness, Decreased strength, Difficulty walking, Hypomobility, Increased edema, Increased fascial restricitons, Increased muscle spasms, Impaired perceived functional ability, Impaired flexibility, Impaired sensation, Improper body mechanics, Postural dysfunction, Pain  Visit Diagnosis: Unsteadiness on  feet  Muscle weakness (generalized)  Difficulty in walking, not elsewhere classified  Other lack of coordination     Problem List Patient Active Problem List   Diagnosis Date Noted  . Lower extremity edema 06/27/2018  . Hyperlipidemia 05/30/2017  . Hypertension 05/30/2017  . GERD (gastroesophageal reflux disease) 05/30/2017  . Parkinson disease ([place]) 10/29/2016  . History of bilateral hip replacements 05/28/2015  . Spinal stenosis, lumbar region, with neurogenic claudication 05/28/2015  . DDD (degenerative disc disease), lumbar 03/13/2015  . Lumbar post-laminectomy syndrome 03/13/2015  . Degenerative cervical disc 03/13/2015  . Status post bilateral total hip replacement 03/13/2015  . Sacroiliac joint dysfunction 03/13/2015  . History of surgery on upper extremity 03/13/2015  . DJD of shoulder 03/13/2015  . Rheumatoid arthritis ([place]) 02/17/2015   Lieutenant Diego PT, DPT 9:23 AM,08/03/18 [place] MAIN [facility] SERVICES [address] [place], [place], [address] Phone: [phone]   Fax:  [phone]  Name: Alaya Iverson MRN: [id number] Date of Birth: [birth year]

## 2018-08-08 ENCOUNTER — Ambulatory Visit: Payer: Medicare Other | Admitting: Physical Therapy

## 2018-08-08 ENCOUNTER — Encounter: Payer: Self-pay | Admitting: Physical Therapy

## 2018-08-08 DIAGNOSIS — R2681 Unsteadiness on feet: Secondary | ICD-10-CM

## 2018-08-08 DIAGNOSIS — R278 Other lack of coordination: Secondary | ICD-10-CM

## 2018-08-08 DIAGNOSIS — M6281 Muscle weakness (generalized): Secondary | ICD-10-CM

## 2018-08-08 DIAGNOSIS — R262 Difficulty in walking, not elsewhere classified: Secondary | ICD-10-CM

## 2018-08-08 NOTE — Therapy (Addendum)
[place] MAIN [facility] SERVICES [address] [place], [place], [address] Phone: [phone]   Fax:  [phone]  Physical Therapy Treatment  Patient Details  Name: Yvonne Singleton MRN: [id number] Date of Birth: 1935/01/26 Referring Provider (PT): Dr. Jennings Books   Encounter Date: 08/08/2018  PT End of Session - 08/08/18 0903    Visit Number  25    Number of Visits  29    Date for PT Re-Evaluation  08/22/18    Authorization Type  5/10 progress note    PT Start Time  0845    PT Stop Time  0925    PT Time Calculation (min)  40 min    Equipment Utilized During Treatment  Gait belt    Activity Tolerance  Patient tolerated treatment well;No increased pain    Behavior During Therapy  WFL for tasks assessed/performed       Past Medical History:  Diagnosis Date  . Back pain   . Breast cancer ([place]) 2007   right breast lumpectomy with rad tx  . Cancer [facility]) 2007   right breast  . Collagen vascular disease ([place])   . Gout   . Hard of hearing   . Hypertension   . Parkinson's disease ([place])   . Personal history of radiation therapy 2007   F/U right breast cancer    Past Surgical History:  Procedure Laterality Date  . ABDOMINAL HYSTERECTOMY    . BREAST LUMPECTOMY Right 2007   suspicious calcs, f/u with radiation  . CATARACT EXTRACTION Bilateral   . ESOPHAGOGASTRODUODENOSCOPY (EGD) WITH PROPOFOL N/A 05/25/2017   Procedure: ESOPHAGOGASTRODUODENOSCOPY (EGD) WITH PROPOFOL;  Surgeon: Lollie Sails, MD;  Location: [facility] ENDOSCOPY;  Service: Endoscopy;  Laterality: N/A;  . ESOPHAGOGASTRODUODENOSCOPY (EGD) WITH PROPOFOL N/A 05/09/2018   Procedure: ESOPHAGOGASTRODUODENOSCOPY (EGD) WITH PROPOFOL;  Surgeon: Lin Landsman, MD;  Location: [facility] ENDOSCOPY;  Service: Gastroenterology;  Laterality: N/A;  . EYE SURGERY Bilateral   . HAND SURGERY    . HIP CLOSED REDUCTION Left 06/24/2015   Procedure: CLOSED REDUCTION HIP;  Surgeon: Dereck Leep, MD;  Location: [facility] ORS;  Service: Orthopedics;  Laterality: Left;  . HIP CLOSED REDUCTION Left 02/09/2016   Procedure: CLOSED MANIPULATION HIP;  Surgeon: Earnestine Leys, MD;  Location: [facility] ORS;  Service: Orthopedics;  Laterality: Left;  . HIP SURGERY    . JOINT REPLACEMENT     bilateral hip  . OOPHORECTOMY    . TOTAL KNEE ARTHROPLASTY Bilateral     There were no vitals filed for this visit.  Subjective Assessment - 08/08/18 0901    Subjective  Patient reports she has chronic pain in her back, states her pain is at her usual, 4/10. Reports no falls or stumbles since last session.    Pertinent History  Pt arrived late to appointment, limiting evaluation. Pt presents with reports of imbalance and weakness.  Pt reports she stopped exercising this past winter so her balance has gotten worse.  Pt has been using Bil crutches at all times for 3-4 years.  Pt uses crutches instead of a walker due to chronic back and Bil wrist pain (R>L) due to gout and RA. Pt denies any falls in the past 6 months but has fallen in the past. Pt has had some issues with dizziness when taking Sinemet and Neurontin together so she has separated her schedule of taking these per Dr. Sharlet Salina. Pt reports she has trouble with mopping, vacuuming, sweeping, walking, outside work (flowers, etc).  Goals: to improve with all of these and to be able to do more in the kitchen.  Pt with h/o Bil TKA and THAs, back surgery. Pt reports her R THA dislocated x3 and L THA dislocated x2 but last time this happened was 3 years ago.  Pt with h/o osteoporosis and RA.    Pt reports numbness and tingling and pain down LLE.  Pt additionally has numbness and tingling RLE but not as worse as on the L side.  These symptoms have been present for over a year and is being followed by Dr. Sharlet Salina for this.  Pt denies any bowel or bladder issues, saddle anesthesia, night sweats, sudden changes in weight.  Pt reports chronic pain in wrists, neck, back, Bil  hip, BLEs.  Current pain in back: 4/10, best pain: 2/10, worst pain: 6/10.     Limitations  Walking;House hold activities;Standing    How long can you sit comfortably?  no issues    How long can you stand comfortably?  10 minutes    How long can you walk comfortably?  15 minutes    Diagnostic tests  Pt had MRI lumbar spine 07/08/15: L5-S1 mild disc desiccation broad-based disc bulging extending to the R neural foramen, moderate to severe facet degenerative changes, moderate to severe R foraminal stenosis, L4-5 mild disc dessication broad-based disc bulging, moderate facet degenerative changes, moderate Bil lateral recess stenosis and mild to moderate central stenosis, mild Bil foraminal stenosis, at L3-4 there is severe degenerative disc disease, moderate to severe facet degenerative changes with mild central stenosis, mild Bil foraminal stenosis, at L2-3 there is severe degenerative disc disease, R hemilaminectomy defect, L1-2 mild disc dessication broad-based disc bulging. She has had periodic steroid injections with mild to moderate relief. Pt is followed at [facility] pain clinic for this. Plan is to schedule a L L5-S1 ESI if her pain worsens.    Patient Stated Goals  see above    Currently in Pain?  Yes    Pain Score  4     Pain Location  Back    Pain Orientation  Lower    Pain Descriptors / Indicators  Aching    Pain Type  Chronic pain    Pain Onset  More than a month ago    Aggravating Factors   standing    Pain Relieving Factors  sitting    Effect of Pain on Daily Activities  slows her down        Therapeutic exercise:  nustep level 3 for warm up (unbilled) x24mns  Leg press 75 lbs x 20 x 3   Lunge on BOSU ball x 10 BLE, cues for posture and 100 % UE support   Standing on 1/2 bolster (Flat side up): Heel/toe rock 2x10 with finger tip hold for balance with cues to keep knee straight for better ankle strategies;  Stepping over 2 hurdle fwd side to side x 10, 50 % UE support , CGA for  assist   Stepping over 1 hurdle bwd x 10 cues for posture, increased hip flexion, 50 % UE support ,  Standing hip abd without resistance due to weakness 2x5 bilaterally, cues for tightening LE musculature  Standing hip extension without GTB due to increased fatigue today 2x5 BLE , cues to not rock fwd    Cues for core activationof TAin standing to reduce backpain and for improved posture. Patient needed several sitting rest breaks during session.  PT Education - 08/08/18 0902    Education Details  HEP    Person(s) Educated  Patient    Methods  Explanation;Demonstration    Comprehension  Verbalized understanding;Returned demonstration;Need further instruction       PT Short Term Goals - 06/27/18 0824      PT SHORT TERM GOAL #1   Title  Pt will independently complete HEP at least 4 days/wk for improved carryover between sessions    Time  2    Period  Weeks    Status  Achieved    Target Date  06/27/09        PT Long Term Goals - 06/27/18 0816      PT LONG TERM GOAL #1   Title  Pt's ABC scale will improve to at least 55% to demonstrate improvement in pt's perceived balance    Baseline  46% 06/07/18 , 06/27/18=  55%    Time  8    Period  Weeks    Status  Partially Met    Target Date  08/22/18      PT LONG TERM GOAL #2   Title  Pt's 5xSTS will improve to equal to or less than 15 seconds to demonstrates improved balance and strength    Baseline  26.47 seconds (pt pushing on thighs to stand)  05/08/18  23.20 sec 06/07/18=20.07 sec, 06/27/18= 20.05 sec    Time  8    Period  Weeks    Status  Partially Met    Target Date  08/22/18      PT LONG TERM GOAL #3   Title  Pt's Berg Balance Test will improve to at least 45/56 to demonstrate improved balance    Baseline  28/56:  05/08/18, 32/56 berg 06/07/18,   06/27/18= 34%     Time  8    Period  Weeks    Status  Partially Met    Target Date  08/22/18      PT LONG TERM GOAL #4   Title   Pt will improve 75mT time to at least 0.80 m/s for improved safety with ambulation in the community    Baseline  0.52 m/s using crutches: .68 m/sec, . 69 m/sec 06/07/18:   06/27/18= .75 m/sec    Time  8    Period  Weeks    Status  Partially Met    Target Date  08/22/18      PT LONG TERM GOAL #5   Title  Pt's TUG will improve to equal to or less than 13 seconds to demonstrate improved balance, gait speed, and strength    Baseline  22.47 seconds  05/08/18  20.30, 06/07/18= 18.63, 06/27/18= 17.96    Time  8    Period  Weeks    Status  Partially Met    Target Date  08/22/18            Plan - 08/08/18 08032   Clinical Impression Statement  Patient demonstrates understanding of HEP with min corrections needed. Patient challenged closed chain and open chain exercises with multiple repetitions due to fatigue. Weak LE combined with weak core musculature results in poor postural control and balance deficits... Patient will continue to benefit from skilled physical therapy to improve balance and mobility.    Rehab Potential  Good    PT Frequency  2x / week    PT Duration  8 weeks    PT Treatment/Interventions  ADLs/Self Care Home Management;Aquatic Therapy;Biofeedback;Cryotherapy;Electrical Stimulation;Iontophoresis 466mml Dexamethasone;Moist Heat;Traction;Ultrasound;DME  Instruction;Gait training;Stair training;Functional mobility training;Therapeutic activities;Therapeutic exercise;Neuromuscular re-education;Balance training;Patient/family education;Manual techniques;Orthotic Fit/Training;Dry needling;Passive range of motion;Compression bandaging;Energy conservation;Taping    PT Next Visit Plan  progress strengthening program and balance interventions    PT Home Exercise Plan  seated marching, sit<>stand, standing hip Abd, standing hip E    Consulted and Agree with Plan of Care  Patient       Patient will benefit from skilled therapeutic intervention in order to improve the following deficits and  impairments:  Abnormal gait, Decreased activity tolerance, Decreased balance, Decreased coordination, Decreased endurance, Decreased knowledge of use of DME, Decreased range of motion, Decreased mobility, Decreased safety awareness, Decreased strength, Difficulty walking, Hypomobility, Increased edema, Increased fascial restricitons, Increased muscle spasms, Impaired perceived functional ability, Impaired flexibility, Impaired sensation, Improper body mechanics, Postural dysfunction, Pain  Visit Diagnosis: Unsteadiness on feet  Muscle weakness (generalized)  Difficulty in walking, not elsewhere classified  Other lack of coordination     Problem List Patient Active Problem List   Diagnosis Date Noted  . Lower extremity edema 06/27/2018  . Hyperlipidemia 05/30/2017  . Hypertension 05/30/2017  . GERD (gastroesophageal reflux disease) 05/30/2017  . Parkinson disease ([place]) 10/29/2016  . History of bilateral hip replacements 05/28/2015  . Spinal stenosis, lumbar region, with neurogenic claudication 05/28/2015  . DDD (degenerative disc disease), lumbar 03/13/2015  . Lumbar post-laminectomy syndrome 03/13/2015  . Degenerative cervical disc 03/13/2015  . Status post bilateral total hip replacement 03/13/2015  . Sacroiliac joint dysfunction 03/13/2015  . History of surgery on upper extremity 03/13/2015  . DJD of shoulder 03/13/2015  . Rheumatoid arthritis ([place]) 02/17/2015    Alanson Puls, [place] DPT 08/08/2018, 9:04 AM  [place] MAIN [facility] SERVICES [address] [place], [place], [address] Phone: [phone]   Fax:  [phone]  Name: Natalin Bible MRN: [id number] Date of Birth: 1935/03/03

## 2018-08-10 ENCOUNTER — Ambulatory Visit: Payer: Medicare Other | Admitting: Physical Therapy

## 2018-08-10 ENCOUNTER — Encounter: Payer: Self-pay | Admitting: Physical Therapy

## 2018-08-10 DIAGNOSIS — M6281 Muscle weakness (generalized): Secondary | ICD-10-CM

## 2018-08-10 DIAGNOSIS — R262 Difficulty in walking, not elsewhere classified: Secondary | ICD-10-CM

## 2018-08-10 DIAGNOSIS — R2681 Unsteadiness on feet: Secondary | ICD-10-CM | POA: Diagnosis not present

## 2018-08-10 DIAGNOSIS — R278 Other lack of coordination: Secondary | ICD-10-CM

## 2018-08-10 NOTE — Therapy (Signed)
[place] MAIN [facility] SERVICES [address] [place], [place], [address] Phone: [phone]   Fax:  [phone]  Physical Therapy Treatment  Patient Details  Name: Yvonne Singleton MRN: [id number] Date of Birth: 1935-04-30 Referring Provider (PT): Dr. Jennings Books   Encounter Date: 08/10/2018  PT End of Session - 08/10/18 0856    Visit Number  26    Number of Visits  29    Date for PT Re-Evaluation  08/22/18    Authorization Type  6/10 progress note    PT Start Time  0845    PT Stop Time  0925    PT Time Calculation (min)  40 min    Equipment Utilized During Treatment  Gait belt    Activity Tolerance  Patient tolerated treatment well;No increased pain    Behavior During Therapy  WFL for tasks assessed/performed       Past Medical History:  Diagnosis Date  . Back pain   . Breast cancer ([place]) 2007   right breast lumpectomy with rad tx  . Cancer [facility]) 2007   right breast  . Collagen vascular disease ([place])   . Gout   . Hard of hearing   . Hypertension   . Parkinson's disease ([place])   . Personal history of radiation therapy 2007   F/U right breast cancer    Past Surgical History:  Procedure Laterality Date  . ABDOMINAL HYSTERECTOMY    . BREAST LUMPECTOMY Right 2007   suspicious calcs, f/u with radiation  . CATARACT EXTRACTION Bilateral   . ESOPHAGOGASTRODUODENOSCOPY (EGD) WITH PROPOFOL N/A 05/25/2017   Procedure: ESOPHAGOGASTRODUODENOSCOPY (EGD) WITH PROPOFOL;  Surgeon: Lollie Sails, MD;  Location: [facility] ENDOSCOPY;  Service: Endoscopy;  Laterality: N/A;  . ESOPHAGOGASTRODUODENOSCOPY (EGD) WITH PROPOFOL N/A 05/09/2018   Procedure: ESOPHAGOGASTRODUODENOSCOPY (EGD) WITH PROPOFOL;  Surgeon: Lin Landsman, MD;  Location: [facility] ENDOSCOPY;  Service: Gastroenterology;  Laterality: N/A;  . EYE SURGERY Bilateral   . HAND SURGERY    . HIP CLOSED REDUCTION Left 06/24/2015   Procedure: CLOSED REDUCTION HIP;  Surgeon: Dereck Leep, MD;  Location: [facility] ORS;  Service: Orthopedics;  Laterality: Left;  . HIP CLOSED REDUCTION Left 02/09/2016   Procedure: CLOSED MANIPULATION HIP;  Surgeon: Earnestine Leys, MD;  Location: [facility] ORS;  Service: Orthopedics;  Laterality: Left;  . HIP SURGERY    . JOINT REPLACEMENT     bilateral hip  . OOPHORECTOMY    . TOTAL KNEE ARTHROPLASTY Bilateral     There were no vitals filed for this visit.  Subjective Assessment - 08/10/18 0846    Subjective  Patient reports she has chronic pain in her back, states her pain is at her usual, 4/10. Reports no falls or stumbles since last session.    Pertinent History  Pt arrived late to appointment, limiting evaluation. Pt presents with reports of imbalance and weakness.  Pt reports she stopped exercising this past winter so her balance has gotten worse.  Pt has been using Bil crutches at all times for 3-4 years.  Pt uses crutches instead of a walker due to chronic back and Bil wrist pain (R>L) due to gout and RA. Pt denies any falls in the past 6 months but has fallen in the past. Pt has had some issues with dizziness when taking Sinemet and Neurontin together so she has separated her schedule of taking these per Dr. Sharlet Salina. Pt reports she has trouble with mopping, vacuuming, sweeping, walking, outside work (flowers, etc).  Goals: to improve with all of these and to be able to do more in the kitchen.  Pt with h/o Bil TKA and THAs, back surgery. Pt reports her R THA dislocated x3 and L THA dislocated x2 but last time this happened was 3 years ago.  Pt with h/o osteoporosis and RA.    Pt reports numbness and tingling and pain down LLE.  Pt additionally has numbness and tingling RLE but not as worse as on the L side.  These symptoms have been present for over a year and is being followed by Dr. Sharlet Salina for this.  Pt denies any bowel or bladder issues, saddle anesthesia, night sweats, sudden changes in weight.  Pt reports chronic pain in wrists, neck, back, Bil  hip, BLEs.  Current pain in back: 4/10, best pain: 2/10, worst pain: 6/10.     Limitations  Walking;House hold activities;Standing    How long can you sit comfortably?  no issues    How long can you stand comfortably?  10 minutes    How long can you walk comfortably?  15 minutes    Diagnostic tests  Pt had MRI lumbar spine 07/08/15: L5-S1 mild disc desiccation broad-based disc bulging extending to the R neural foramen, moderate to severe facet degenerative changes, moderate to severe R foraminal stenosis, L4-5 mild disc dessication broad-based disc bulging, moderate facet degenerative changes, moderate Bil lateral recess stenosis and mild to moderate central stenosis, mild Bil foraminal stenosis, at L3-4 there is severe degenerative disc disease, moderate to severe facet degenerative changes with mild central stenosis, mild Bil foraminal stenosis, at L2-3 there is severe degenerative disc disease, R hemilaminectomy defect, L1-2 mild disc dessication broad-based disc bulging. She has had periodic steroid injections with mild to moderate relief. Pt is followed at [facility] pain clinic for this. Plan is to schedule a L L5-S1 ESI if her pain worsens.    Patient Stated Goals  see above    Currently in Pain?  Yes    Pain Score  4     Pain Location  Back    Pain Orientation  Lower    Pain Descriptors / Indicators  Aching    Pain Type  Chronic pain    Pain Onset  More than a month ago    Multiple Pain Sites  No       TREATMENT   Ther-ex  Octane fitness L2 x 5 minutes during history (3 minutes unbilled); Lunge on BOSU ball x 10 BLE Standing marching with 2# ankle weights x 15; Standing hip extension with 2# ankle weights x 15; Standing hip abduction with 2# ankle weights x 15;  Standing HS curls 2# ankle weights x 15 Tapping 6 inch stool x 20 reps BLE  Airex balance with toe taps to 6" step alternating LE x 10 each, faded UE support, pt able to perform with 2 finger support at the end of the  set; Static balance on 1/2 bolster (flat side up) with no UE support 30s x 2; Standing on 1/2 bolster (Flat side up) heel/toe rock x15 with no UE hold for balance but intermittent assist from therapist to prevent falls especially with heel rocking and posterior LOB;   CGA and Min  verbal cues used throughout with increased in postural sway and LOB most seen with narrow base of support and while on uneven surfaces. Continues to have balance deficits typical with diagnosis. Patient performs intermediate level exercises without pain behaviors and needs verbal cuing for postural  alignment and head positioning                      PT Education - 08/10/18 0856    Education Details  HEP    Person(s) Educated  Patient    Methods  Explanation;Demonstration;Verbal cues    Comprehension  Returned demonstration;Verbalized understanding;Need further instruction       PT Short Term Goals - 06/27/18 0824      PT SHORT TERM GOAL #1   Title  Pt will independently complete HEP at least 4 days/wk for improved carryover between sessions    Time  2    Period  Weeks    Status  Achieved    Target Date  06/27/09        PT Long Term Goals - 06/27/18 0816      PT LONG TERM GOAL #1   Title  Pt's ABC scale will improve to at least 55% to demonstrate improvement in pt's perceived balance    Baseline  46% 06/07/18 , 06/27/18=  55%    Time  8    Period  Weeks    Status  Partially Met    Target Date  08/22/18      PT LONG TERM GOAL #2   Title  Pt's 5xSTS will improve to equal to or less than 15 seconds to demonstrates improved balance and strength    Baseline  26.47 seconds (pt pushing on thighs to stand)  05/08/18  23.20 sec 06/07/18=20.07 sec, 06/27/18= 20.05 sec    Time  8    Period  Weeks    Status  Partially Met    Target Date  08/22/18      PT LONG TERM GOAL #3   Title  Pt's Berg Balance Test will improve to at least 45/56 to demonstrate improved balance    Baseline  28/56:   05/08/18, 32/56 berg 06/07/18,   06/27/18= 34%     Time  8    Period  Weeks    Status  Partially Met    Target Date  08/22/18      PT LONG TERM GOAL #4   Title  Pt will improve 6mT time to at least 0.80 m/s for improved safety with ambulation in the community    Baseline  0.52 m/s using crutches: .68 m/sec, . 69 m/sec 06/07/18:   06/27/18= .75 m/sec    Time  8    Period  Weeks    Status  Partially Met    Target Date  08/22/18      PT LONG TERM GOAL #5   Title  Pt's TUG will improve to equal to or less than 13 seconds to demonstrate improved balance, gait speed, and strength    Baseline  22.47 seconds  05/08/18  20.30, 06/07/18= 18.63, 06/27/18= 17.96    Time  8    Period  Weeks    Status  Partially Met    Target Date  08/22/18            Plan - 08/10/18 0857    Clinical Impression Statement  Pt requires direction and verbal cues for correct performance of exercises. Patient demonstrates weakness in BLE and performs open and closed chain exercises with no reports of pain. Pt was able to perform all exercises with min assist and VC for technique. Patient struggles with control during movement as well as leg position with unstable surfaces.  Pt encouraged continuing HEP .Follow-up as scheduled.  Rehab Potential  Good    PT Frequency  2x / week    PT Duration  8 weeks    PT Treatment/Interventions  ADLs/Self Care Home Management;Aquatic Therapy;Biofeedback;Cryotherapy;Electrical Stimulation;Iontophoresis 95m/ml Dexamethasone;Moist Heat;Traction;Ultrasound;DME Instruction;Gait training;Stair training;Functional mobility training;Therapeutic activities;Therapeutic exercise;Neuromuscular re-education;Balance training;Patient/family education;Manual techniques;Orthotic Fit/Training;Dry needling;Passive range of motion;Compression bandaging;Energy conservation;Taping    PT Next Visit Plan  progress strengthening program and balance interventions    PT Home Exercise Plan  seated marching,  sit<>stand, standing hip Abd, standing hip E    Consulted and Agree with Plan of Care  Patient       Patient will benefit from skilled therapeutic intervention in order to improve the following deficits and impairments:  Abnormal gait, Decreased activity tolerance, Decreased balance, Decreased coordination, Decreased endurance, Decreased knowledge of use of DME, Decreased range of motion, Decreased mobility, Decreased safety awareness, Decreased strength, Difficulty walking, Hypomobility, Increased edema, Increased fascial restricitons, Increased muscle spasms, Impaired perceived functional ability, Impaired flexibility, Impaired sensation, Improper body mechanics, Postural dysfunction, Pain  Visit Diagnosis: Unsteadiness on feet  Muscle weakness (generalized)  Difficulty in walking, not elsewhere classified  Other lack of coordination     Problem List Patient Active Problem List   Diagnosis Date Noted  . Lower extremity edema 06/27/2018  . Hyperlipidemia 05/30/2017  . Hypertension 05/30/2017  . GERD (gastroesophageal reflux disease) 05/30/2017  . Parkinson disease ([place] 10/29/2016  . History of bilateral hip replacements 05/28/2015  . Spinal stenosis, lumbar region, with neurogenic claudication 05/28/2015  . DDD (degenerative disc disease), lumbar 03/13/2015  . Lumbar post-laminectomy syndrome 03/13/2015  . Degenerative cervical disc 03/13/2015  . Status post bilateral total hip replacement 03/13/2015  . Sacroiliac joint dysfunction 03/13/2015  . History of surgery on upper extremity 03/13/2015  . DJD of shoulder 03/13/2015  . Rheumatoid arthritis ([place] 02/17/2015    MAlanson Puls PVirginiaDPT 08/10/2018, 8:59 AM  [place] [facility] [address] [address]Phone: [phone]  Fax:  [phone] Name: Yvonne RennertMRN: [id number] of Birth: [birth year]

## 2018-08-14 ENCOUNTER — Ambulatory Visit: Payer: Medicare Other | Admitting: Physical Therapy

## 2018-08-16 ENCOUNTER — Ambulatory Visit: Payer: Medicare Other | Admitting: Physical Therapy

## 2018-08-22 ENCOUNTER — Ambulatory Visit: Payer: Medicare Other | Admitting: Physical Therapy

## 2018-09-12 ENCOUNTER — Other Ambulatory Visit: Payer: Self-pay | Admitting: Gastroenterology

## 2018-09-12 DIAGNOSIS — R9389 Abnormal findings on diagnostic imaging of other specified body structures: Secondary | ICD-10-CM

## 2018-09-19 ENCOUNTER — Ambulatory Visit
Admission: RE | Admit: 2018-09-19 | Discharge: 2018-09-19 | Disposition: A | Discharge status: Home / Self Care | Payer: Medicare Other | Source: Ambulatory Visit | Attending: Gastroenterology | Admitting: Gastroenterology

## 2018-09-19 DIAGNOSIS — R9389 Abnormal findings on diagnostic imaging of other specified body structures: Secondary | ICD-10-CM | POA: Diagnosis present

## 2018-10-23 ENCOUNTER — Inpatient Hospital Stay
Admission: EM | Admit: 2018-10-23 | Discharge: 2018-10-27 | DRG: 372 | Disposition: A | Discharge status: Home / Self Care | Payer: Medicare Other | Attending: Internal Medicine | Admitting: Internal Medicine

## 2018-10-23 ENCOUNTER — Encounter: Payer: Self-pay | Admitting: Emergency Medicine

## 2018-10-23 ENCOUNTER — Other Ambulatory Visit: Payer: Self-pay

## 2018-10-23 ENCOUNTER — Emergency Department: Payer: Medicare Other

## 2018-10-23 DIAGNOSIS — H9190 Unspecified hearing loss, unspecified ear: Secondary | ICD-10-CM | POA: Diagnosis present

## 2018-10-23 DIAGNOSIS — Z79899 Other long term (current) drug therapy: Secondary | ICD-10-CM

## 2018-10-23 DIAGNOSIS — Z853 Personal history of malignant neoplasm of breast: Secondary | ICD-10-CM

## 2018-10-23 DIAGNOSIS — R1084 Generalized abdominal pain: Secondary | ICD-10-CM

## 2018-10-23 DIAGNOSIS — M109 Gout, unspecified: Secondary | ICD-10-CM | POA: Diagnosis present

## 2018-10-23 DIAGNOSIS — R5381 Other malaise: Secondary | ICD-10-CM | POA: Diagnosis present

## 2018-10-23 DIAGNOSIS — A498 Other bacterial infections of unspecified site: Secondary | ICD-10-CM | POA: Diagnosis present

## 2018-10-23 DIAGNOSIS — N179 Acute kidney failure, unspecified: Secondary | ICD-10-CM | POA: Diagnosis present

## 2018-10-23 DIAGNOSIS — Z888 Allergy status to other drugs, medicaments and biological substances status: Secondary | ICD-10-CM

## 2018-10-23 DIAGNOSIS — Z91041 Radiographic dye allergy status: Secondary | ICD-10-CM

## 2018-10-23 DIAGNOSIS — G20 Parkinson's disease: Secondary | ICD-10-CM | POA: Diagnosis present

## 2018-10-23 DIAGNOSIS — Z923 Personal history of irradiation: Secondary | ICD-10-CM

## 2018-10-23 DIAGNOSIS — I10 Essential (primary) hypertension: Secondary | ICD-10-CM | POA: Diagnosis present

## 2018-10-23 DIAGNOSIS — Z96653 Presence of artificial knee joint, bilateral: Secondary | ICD-10-CM | POA: Diagnosis present

## 2018-10-23 DIAGNOSIS — R739 Hyperglycemia, unspecified: Secondary | ICD-10-CM | POA: Diagnosis present

## 2018-10-23 DIAGNOSIS — A0472 Enterocolitis due to Clostridium difficile, not specified as recurrent: Secondary | ICD-10-CM | POA: Diagnosis not present

## 2018-10-23 DIAGNOSIS — K5000 Crohn's disease of small intestine without complications: Secondary | ICD-10-CM | POA: Diagnosis present

## 2018-10-23 DIAGNOSIS — Z96643 Presence of artificial hip joint, bilateral: Secondary | ICD-10-CM | POA: Diagnosis present

## 2018-10-23 DIAGNOSIS — K529 Noninfective gastroenteritis and colitis, unspecified: Secondary | ICD-10-CM

## 2018-10-23 LAB — COMPREHENSIVE METABOLIC PANEL
ALT: 12 U/L (ref 0–44)
ANION GAP: 8 (ref 5–15)
AST: 33 U/L (ref 15–41)
Albumin: 4.4 g/dL (ref 3.5–5.0)
Alkaline Phosphatase: 171 U/L — ABNORMAL HIGH (ref 38–126)
BUN: 33 mg/dL — ABNORMAL HIGH (ref 8–23)
CO2: 22 mmol/L (ref 22–32)
Calcium: 9.6 mg/dL (ref 8.9–10.3)
Chloride: 107 mmol/L (ref 98–111)
Creatinine, Ser: 1.01 mg/dL — ABNORMAL HIGH (ref 0.44–1.00)
GFR calc Af Amer: >60 mL/min (ref >60)
GFR calc non Af Amer: 52 mL/min — ABNORMAL LOW (ref >60)
Glucose, Bld: 154 mg/dL — ABNORMAL HIGH (ref 70–99)
Potassium: 4.6 mmol/L (ref 3.5–5.1)
Sodium: 137 mmol/L (ref 135–145)
Total Bilirubin: 0.6 mg/dL (ref 0.3–1.2)
Total Protein: 8 g/dL (ref 6.5–8.1)

## 2018-10-23 LAB — CBC
HCT: 46.1 % — ABNORMAL HIGH (ref 36.0–46.0)
Hemoglobin: 14.8 g/dL (ref 12.0–15.0)
MCH: 32.4 pg (ref 26.0–34.0)
MCHC: 32.1 g/dL (ref 30.0–36.0)
MCV: 100.9 fL — ABNORMAL HIGH (ref 80.0–100.0)
NRBC: 0 % (ref 0.0–0.2)
Platelets: 298 10*3/uL (ref 150–400)
RBC: 4.57 MIL/uL (ref 3.87–5.11)
RDW: 14.2 % (ref 11.5–15.5)
WBC: 21.3 10*3/uL — ABNORMAL HIGH (ref 4.0–10.5)

## 2018-10-23 LAB — TROPONIN I: Troponin I: <0.03 ng/mL (ref <0.03)

## 2018-10-23 LAB — LIPASE, BLOOD: Lipase: 40 U/L (ref 11–51)

## 2018-10-23 MED ORDER — MORPHINE SULFATE (PF) 4 MG/ML IV SOLN
4.0000 mg | Freq: Once | INTRAVENOUS | Status: AC
  Administered 2018-10-23: 4 mg via INTRAVENOUS
  Filled 2018-10-23: qty 1

## 2018-10-23 MED ORDER — ONDANSETRON 4 MG PO TBDP
4.0000 mg | ORAL_TABLET | Freq: Once | ORAL | Status: AC | PRN
  Administered 2018-10-23: 4 mg via ORAL
  Filled 2018-10-23: qty 1

## 2018-10-23 MED ORDER — ONDANSETRON HCL 4 MG/2ML IJ SOLN
4.0000 mg | Freq: Once | INTRAMUSCULAR | Status: AC
  Administered 2018-10-23: 4 mg via INTRAVENOUS

## 2018-10-23 MED ORDER — ONDANSETRON HCL 4 MG/2ML IJ SOLN
INTRAMUSCULAR | Status: AC
  Filled 2018-10-23: qty 2

## 2018-10-23 MED ORDER — SODIUM CHLORIDE 0.9 % IV BOLUS
500.0000 mL | Freq: Once | INTRAVENOUS | Status: AC
  Administered 2018-10-23: 500 mL via INTRAVENOUS

## 2018-10-23 NOTE — ED Notes (Signed)
Pt went to the bathroom via wheel chair accompanied her husband. Pt returned to the bedroom without difficulty.

## 2018-10-23 NOTE — ED Provider Notes (Signed)
[facility] Emergency Department Provider Note   ____________________________________________    I have reviewed the triage vital signs and the nursing notes.   HISTORY  Chief Complaint Abdominal Pain     HPI Yvonne Singleton is a 84 y.o. female who presents with complaints of nausea vomiting abdominal pain.  Patient reports symptoms started earlier this morning.  She describes nausea and dry heaving.  Has also had loose stools.  Occasionally feeling lightheaded.  Does have a history of a hiatal hernia.  Vomitus is nonbilious, nonbloody.  Has reported chills and body aches but no fevers.  No recent travel.  No sick contacts reported  Past Medical History:  Diagnosis Date  . Back pain   . Breast cancer ([place]) 2007   right breast lumpectomy with rad tx  . Cancer [facility]) 2007   right breast  . Collagen vascular disease ([place])   . Gout   . Hard of hearing   . Hypertension   . Parkinson's disease ([place])   . Personal history of radiation therapy 2007   F/U right breast cancer    Patient Active Problem List   Diagnosis Date Noted  . Lower extremity edema 06/27/2018  . Hyperlipidemia 05/30/2017  . Hypertension 05/30/2017  . GERD (gastroesophageal reflux disease) 05/30/2017  . Parkinson disease ([place]) 10/29/2016  . History of bilateral hip replacements 05/28/2015  . Spinal stenosis, lumbar region, with neurogenic claudication 05/28/2015  . DDD (degenerative disc disease), lumbar 03/13/2015  . Lumbar post-laminectomy syndrome 03/13/2015  . Degenerative cervical disc 03/13/2015  . Status post bilateral total hip replacement 03/13/2015  . Sacroiliac joint dysfunction 03/13/2015  . History of surgery on upper extremity 03/13/2015  . DJD of shoulder 03/13/2015  . Rheumatoid arthritis ([place]) 02/17/2015    Past Surgical History:  Procedure Laterality Date  . ABDOMINAL HYSTERECTOMY    . BREAST LUMPECTOMY Right 2007   suspicious calcs, f/u with  radiation  . CATARACT EXTRACTION Bilateral   . ESOPHAGOGASTRODUODENOSCOPY (EGD) WITH PROPOFOL N/A 05/25/2017   Procedure: ESOPHAGOGASTRODUODENOSCOPY (EGD) WITH PROPOFOL;  Surgeon: Lollie Sails, MD;  Location: [facility] ENDOSCOPY;  Service: Endoscopy;  Laterality: N/A;  . ESOPHAGOGASTRODUODENOSCOPY (EGD) WITH PROPOFOL N/A 05/09/2018   Procedure: ESOPHAGOGASTRODUODENOSCOPY (EGD) WITH PROPOFOL;  Surgeon: Lin Landsman, MD;  Location: [facility] ENDOSCOPY;  Service: Gastroenterology;  Laterality: N/A;  . EYE SURGERY Bilateral   . HAND SURGERY    . HIP CLOSED REDUCTION Left 06/24/2015   Procedure: CLOSED REDUCTION HIP;  Surgeon: Dereck Leep, MD;  Location: [facility] ORS;  Service: Orthopedics;  Laterality: Left;  . HIP CLOSED REDUCTION Left 02/09/2016   Procedure: CLOSED MANIPULATION HIP;  Surgeon: Earnestine Leys, MD;  Location: [facility] ORS;  Service: Orthopedics;  Laterality: Left;  . HIP SURGERY    . JOINT REPLACEMENT     bilateral hip  . OOPHORECTOMY    . TOTAL KNEE ARTHROPLASTY Bilateral     Prior to Admission medications   Medication Sig Start Date End Date Taking? Authorizing Provider  allopurinol (ZYLOPRIM) 100 MG tablet Take 200 mg by mouth at bedtime.     [provider]  b complex vitamins tablet Take 1 tablet by mouth daily.    [provider]  Calcium Carbonate-Vitamin D (CALCIUM 600+D) 600-200 MG-UNIT TABS Take 1 tablet by mouth daily.    [provider]  carbidopa-levodopa (SINEMET CR) 50-200 MG per tablet Take 1 tablet by mouth at bedtime.     [provider]  cholecalciferol (VITAMIN  D) 1000 units tablet Take 2,000 Units by mouth daily.    [provider]  furosemide (LASIX) 20 MG tablet Take 30 mg by mouth.    [provider]  gabapentin (NEURONTIN) 100 MG capsule Limit 1 capsule by mouth per day or every other day if tolerated 06/17/16   Mohammed Kindle, MD  hydroxychloroquine (PLAQUENIL) 200 MG tablet Take 400 mg by mouth daily.      [provider]  latanoprost (XALATAN) 0.005 % ophthalmic solution  05/26/18   [provider]  losartan (COZAAR) 100 MG tablet Take 100 mg by mouth daily.     [provider]  metroNIDAZOLE (METROGEL) 0.75 % vaginal gel metronidazole 0.75 % vaginal gel    [provider]  omeprazole (PRILOSEC) 40 MG capsule Take 1 capsule (40 mg total) by mouth daily before breakfast. 05/09/18 08/07/18  Lin Landsman, MD  tolterodine (DETROL LA) 4 MG 24 hr capsule Take 4 mg by mouth daily.    [provider]     Allergies Trospium; Iodinated diagnostic agents; and Streptomycin  Family History  Problem Relation Age of Onset  . Heart disease Mother   . Arthritis Mother   . Heart disease Father   . Ulcers Father   . Breast cancer Maternal Aunt     Social History Social History   Tobacco Use  . Smoking status: Never Smoker  . Smokeless tobacco: Never Used  Substance Use Topics  . Alcohol use: No    Alcohol/week: 0.0 standard drinks  . Drug use: No    Review of Systems  Constitutional: As above Eyes: No visual changes.  ENT: No sore throat. Cardiovascular: Denies chest pain. Respiratory: Denies shortness of breath. Gastrointestinal: As above.   Genitourinary: Negative for dysuria. Musculoskeletal: Negative for back pain. Skin: Negative for rash. Neurological: Negative for headaches   ____________________________________________   PHYSICAL EXAM:  VITAL SIGNS: ED Triage Vitals  Enc Vitals Group     BP 10/23/18 1728 (!) 142/77     Pulse Rate 10/23/18 1728 82     Resp 10/23/18 2222 18     Temp 10/23/18 1728 97.7 F (36.5 C)     Temp Source 10/23/18 1728 Oral     SpO2 10/23/18 1728 95 %     Weight 10/23/18 1729 78.5 kg (173 lb)     Height 10/23/18 1729 1.613 m (5' 3.5")     Head Circumference --      Peak Flow --      Pain Score 10/23/18 1732 3     Pain Loc --      Pain Edu? --      Excl. in [place]? --     Constitutional: Alert  and oriented.  Eyes: Conjunctivae are normal.   Nose: No congestion/rhinnorhea. Mouth/Throat: Mucous membranes are moist.    Cardiovascular: Normal rate, regular rhythm. Grossly normal heart sounds.  Good peripheral circulation. Respiratory: Normal respiratory effort.  No retractions. Lungs CTAB. Gastrointestinal: No distention, soft, no significant tenderness to palpation  Musculoskeletal: No lower extremity tenderness nor edema.  Warm and well perfused Neurologic:  Normal speech and language. No gross focal neurologic deficits are appreciated.  Skin:  Skin is warm, dry and intact. No rash noted. Psychiatric: Mood and affect are normal. Speech and behavior are normal.  ____________________________________________   LABS (all labs ordered are listed, but only abnormal results are displayed)  Labs Reviewed  COMPREHENSIVE METABOLIC PANEL - Abnormal; Notable for the following components:  Result Value   Glucose, Bld 154 (*)    BUN 33 (*)    Creatinine, Ser 1.01 (*)    Alkaline Phosphatase 171 (*)    GFR calc non Af Amer 52 (*)    All other components within normal limits  CBC - Abnormal; Notable for the following components:   WBC 21.3 (*)    HCT 46.1 (*)    MCV 100.9 (*)    All other components within normal limits  LIPASE, BLOOD  TROPONIN I  URINALYSIS, COMPLETE (UACMP) WITH MICROSCOPIC   ____________________________________________  EKG  ED ECG REPORT I, Lavonia Drafts, the attending physician, personally viewed and interpreted this ECG.  Date: 10/23/2018  Rhythm: normal sinus rhythm QRS Axis: normal Intervals: normal ST/T Wave abnormalities: Nonspecific ST changes Narrative Interpretation: no evidence of acute ischemia  ____________________________________________  RADIOLOGY  CT abdomen pelvis consistent with enteritis ____________________________________________   PROCEDURES  Procedure(s) performed: No  Procedures   Critical Care performed:  No ____________________________________________   INITIAL IMPRESSION / ASSESSMENT AND PLAN / ED COURSE  Pertinent labs & imaging results that were available during my care of the patient were reviewed by me and considered in my medical decision making (see chart for details).  She presents with nausea vomiting diarrhea, suspicious for viral gastroenteritis which is common in the community this time.  Lab work significant for elevated white blood cell count as well as elevated BUN to creatinine ratio consistent with dehydration.  Will treat with IV fluids, IV Zofran, IV morphine obtain imaging and reevaluate  CT scan most consistent with enteritis which is consistent with her clinical picture.  However she continues to have significant pain even after treatment and 2 doses of IV morphine, will require admission for further evaluation    ____________________________________________   FINAL CLINICAL IMPRESSION(S) / ED DIAGNOSES  Final diagnoses:  Generalized abdominal pain  Gastroenteritis        Note:  This document was prepared using Dragon voice recognition software and may include unintentional dictation errors.   Lavonia Drafts, MD 10/23/18 2300

## 2018-10-23 NOTE — ED Notes (Signed)
Report given to Henry RN 

## 2018-10-23 NOTE — ED Triage Notes (Signed)
Pt c/o upper abdominal pain with nausea and "belching" today.  Also has been having diarrhea today.  Has had dizziness and feeling lightheaded as well.  VSS. Color WNL. Pt alert and oriented at this time. Pt points to epigastric area.  Denies blood in stool or vomit.

## 2018-10-24 ENCOUNTER — Encounter: Payer: Self-pay | Admitting: Internal Medicine

## 2018-10-24 ENCOUNTER — Other Ambulatory Visit: Payer: Self-pay

## 2018-10-24 DIAGNOSIS — R739 Hyperglycemia, unspecified: Secondary | ICD-10-CM | POA: Diagnosis present

## 2018-10-24 DIAGNOSIS — Z923 Personal history of irradiation: Secondary | ICD-10-CM | POA: Diagnosis not present

## 2018-10-24 DIAGNOSIS — G20 Parkinson's disease: Secondary | ICD-10-CM | POA: Diagnosis present

## 2018-10-24 DIAGNOSIS — K5000 Crohn's disease of small intestine without complications: Secondary | ICD-10-CM | POA: Diagnosis present

## 2018-10-24 DIAGNOSIS — Z888 Allergy status to other drugs, medicaments and biological substances status: Secondary | ICD-10-CM | POA: Diagnosis not present

## 2018-10-24 DIAGNOSIS — A0472 Enterocolitis due to Clostridium difficile, not specified as recurrent: Secondary | ICD-10-CM | POA: Diagnosis present

## 2018-10-24 DIAGNOSIS — Z853 Personal history of malignant neoplasm of breast: Secondary | ICD-10-CM | POA: Diagnosis not present

## 2018-10-24 DIAGNOSIS — H9190 Unspecified hearing loss, unspecified ear: Secondary | ICD-10-CM | POA: Diagnosis present

## 2018-10-24 DIAGNOSIS — I10 Essential (primary) hypertension: Secondary | ICD-10-CM | POA: Diagnosis present

## 2018-10-24 DIAGNOSIS — R5381 Other malaise: Secondary | ICD-10-CM | POA: Diagnosis present

## 2018-10-24 DIAGNOSIS — Z96653 Presence of artificial knee joint, bilateral: Secondary | ICD-10-CM | POA: Diagnosis present

## 2018-10-24 DIAGNOSIS — A498 Other bacterial infections of unspecified site: Secondary | ICD-10-CM | POA: Diagnosis present

## 2018-10-24 DIAGNOSIS — N179 Acute kidney failure, unspecified: Secondary | ICD-10-CM | POA: Diagnosis present

## 2018-10-24 DIAGNOSIS — Z96643 Presence of artificial hip joint, bilateral: Secondary | ICD-10-CM | POA: Diagnosis present

## 2018-10-24 DIAGNOSIS — Z91041 Radiographic dye allergy status: Secondary | ICD-10-CM | POA: Diagnosis not present

## 2018-10-24 DIAGNOSIS — Z79899 Other long term (current) drug therapy: Secondary | ICD-10-CM | POA: Diagnosis not present

## 2018-10-24 DIAGNOSIS — M109 Gout, unspecified: Secondary | ICD-10-CM | POA: Diagnosis present

## 2018-10-24 LAB — GASTROINTESTINAL PANEL BY PCR, STOOL (REPLACES STOOL CULTURE)

## 2018-10-24 LAB — CBC
HCT: 40.4 % (ref 36.0–46.0)
Hemoglobin: 13 g/dL (ref 12.0–15.0)
MCH: 32.5 pg (ref 26.0–34.0)
MCHC: 32.2 g/dL (ref 30.0–36.0)
MCV: 101 fL — AB (ref 80.0–100.0)
PLATELETS: 235 10*3/uL (ref 150–400)
RBC: 4 MIL/uL (ref 3.87–5.11)
RDW: 14.2 % (ref 11.5–15.5)
WBC: 12.1 10*3/uL — ABNORMAL HIGH (ref 4.0–10.5)
nRBC: 0 % (ref 0.0–0.2)

## 2018-10-24 LAB — C DIFFICILE QUICK SCREEN W PCR REFLEX
C Diff antigen: POSITIVE — AB
C Diff toxin: NEGATIVE

## 2018-10-24 LAB — PROCALCITONIN: Procalcitonin: <0.1 ng/mL

## 2018-10-24 LAB — LACTIC ACID, PLASMA: Lactic Acid, Venous: 0.8 mmol/L (ref 0.5–1.9)

## 2018-10-24 LAB — BASIC METABOLIC PANEL
Anion gap: 8 (ref 5–15)
BUN: 42 mg/dL — ABNORMAL HIGH (ref 8–23)
CO2: 18 mmol/L — ABNORMAL LOW (ref 22–32)
Calcium: 8.8 mg/dL — ABNORMAL LOW (ref 8.9–10.3)
Chloride: 111 mmol/L (ref 98–111)
Creatinine, Ser: 1.34 mg/dL — ABNORMAL HIGH (ref 0.44–1.00)
GFR calc Af Amer: 43 mL/min — ABNORMAL LOW (ref >60)
GFR, EST NON AFRICAN AMERICAN: 37 mL/min — AB (ref >60)
Glucose, Bld: 75 mg/dL (ref 70–99)
Potassium: 3.6 mmol/L (ref 3.5–5.1)
Sodium: 137 mmol/L (ref 135–145)

## 2018-10-24 LAB — LACTATE DEHYDROGENASE: LDH: 134 U/L (ref 98–192)

## 2018-10-24 LAB — CLOSTRIDIUM DIFFICILE BY PCR, REFLEXED: Toxigenic C. Difficile by PCR: POSITIVE — AB

## 2018-10-24 LAB — MAGNESIUM
Magnesium: 1.7 mg/dL (ref 1.7–2.4)
Magnesium: 1.8 mg/dL (ref 1.7–2.4)

## 2018-10-24 LAB — PHOSPHORUS: PHOSPHORUS: 4.2 mg/dL (ref 2.5–4.6)

## 2018-10-24 LAB — LACTOFERRIN, FECAL, QUALITATIVE: LACTOFERRIN, FECAL, QUAL: POSITIVE — AB

## 2018-10-24 MED ORDER — PANTOPRAZOLE SODIUM 40 MG PO TBEC
40.0000 mg | DELAYED_RELEASE_TABLET | Freq: Two times a day (BID) | ORAL | Status: DC
  Administered 2018-10-24 – 2018-10-27 (×7): 40 mg via ORAL
  Filled 2018-10-24 (×7): qty 1

## 2018-10-24 MED ORDER — MORPHINE SULFATE (PF) 2 MG/ML IV SOLN
2.0000 mg | INTRAVENOUS | Status: DC | PRN
  Administered 2018-10-24: 2 mg via INTRAVENOUS
  Filled 2018-10-24: qty 1

## 2018-10-24 MED ORDER — VANCOMYCIN 50 MG/ML ORAL SOLUTION
125.0000 mg | Freq: Four times a day (QID) | ORAL | Status: DC
  Administered 2018-10-24 – 2018-10-27 (×13): 125 mg via ORAL
  Filled 2018-10-24 (×15): qty 2.5

## 2018-10-24 MED ORDER — ENOXAPARIN SODIUM 40 MG/0.4ML ~~LOC~~ SOLN
40.0000 mg | SUBCUTANEOUS | Status: DC
  Filled 2018-10-24: qty 0.4

## 2018-10-24 MED ORDER — SENNOSIDES-DOCUSATE SODIUM 8.6-50 MG PO TABS: 1.0000 | ORAL_TABLET | Freq: Every evening | ORAL | Status: DC | PRN

## 2018-10-24 MED ORDER — FESOTERODINE FUMARATE ER 8 MG PO TB24
8.0000 mg | ORAL_TABLET | Freq: Every day | ORAL | Status: DC
  Administered 2018-10-24 – 2018-10-27 (×4): 8 mg via ORAL
  Filled 2018-10-24 (×4): qty 1

## 2018-10-24 MED ORDER — DULOXETINE HCL 30 MG PO CPEP
30.0000 mg | ORAL_CAPSULE | Freq: Every day | ORAL | Status: DC
  Administered 2018-10-24 – 2018-10-27 (×4): 30 mg via ORAL
  Filled 2018-10-24 (×4): qty 1

## 2018-10-24 MED ORDER — SPIRONOLACTONE 25 MG PO TABS
25.0000 mg | ORAL_TABLET | Freq: Every day | ORAL | Status: DC
  Administered 2018-10-24 – 2018-10-25 (×2): 25 mg via ORAL
  Filled 2018-10-24 (×3): qty 1

## 2018-10-24 MED ORDER — ONDANSETRON HCL 4 MG PO TABS
4.0000 mg | ORAL_TABLET | Freq: Four times a day (QID) | ORAL | Status: DC | PRN
  Administered 2018-10-26 (×2): 4 mg via ORAL
  Filled 2018-10-24 (×2): qty 1

## 2018-10-24 MED ORDER — SODIUM CHLORIDE 0.9 % IV SOLN
INTRAVENOUS | Status: DC
  Administered 2018-10-24 – 2018-10-26 (×5): via INTRAVENOUS

## 2018-10-24 MED ORDER — ACETAMINOPHEN 650 MG RE SUPP: 650.0000 mg | Freq: Four times a day (QID) | RECTAL | Status: DC | PRN

## 2018-10-24 MED ORDER — HYDROXYCHLOROQUINE SULFATE 200 MG PO TABS
400.0000 mg | ORAL_TABLET | Freq: Every day | ORAL | Status: DC
  Administered 2018-10-24 – 2018-10-26 (×3): 400 mg via ORAL
  Filled 2018-10-24 (×3): qty 2

## 2018-10-24 MED ORDER — LOSARTAN POTASSIUM 50 MG PO TABS
100.0000 mg | ORAL_TABLET | Freq: Every day | ORAL | Status: DC
  Administered 2018-10-24 – 2018-10-26 (×3): 100 mg via ORAL
  Filled 2018-10-24 (×4): qty 2

## 2018-10-24 MED ORDER — MORPHINE SULFATE (PF) 4 MG/ML IV SOLN: 4.0000 mg | INTRAVENOUS | Status: DC | PRN

## 2018-10-24 MED ORDER — FUROSEMIDE 20 MG PO TABS: 30.0000 mg | ORAL_TABLET | Freq: Every day | ORAL | Status: DC

## 2018-10-24 MED ORDER — LACTATED RINGERS IV SOLN
INTRAVENOUS | Status: AC
  Administered 2018-10-24 (×2): via INTRAVENOUS

## 2018-10-24 MED ORDER — CARBIDOPA-LEVODOPA ER 50-200 MG PO TBCR
1.0000 | EXTENDED_RELEASE_TABLET | Freq: Every day | ORAL | Status: DC
  Administered 2018-10-24 – 2018-10-26 (×3): 1 via ORAL
  Filled 2018-10-24 (×4): qty 1

## 2018-10-24 MED ORDER — GABAPENTIN 100 MG PO CAPS
100.0000 mg | ORAL_CAPSULE | Freq: Two times a day (BID) | ORAL | Status: DC
  Administered 2018-10-24 – 2018-10-27 (×7): 100 mg via ORAL
  Filled 2018-10-24 (×7): qty 1

## 2018-10-24 MED ORDER — ACETAMINOPHEN 325 MG PO TABS
650.0000 mg | ORAL_TABLET | Freq: Four times a day (QID) | ORAL | Status: DC | PRN
  Administered 2018-10-24 – 2018-10-25 (×3): 650 mg via ORAL
  Filled 2018-10-24 (×4): qty 2

## 2018-10-24 MED ORDER — ONDANSETRON HCL 4 MG/2ML IJ SOLN
4.0000 mg | Freq: Four times a day (QID) | INTRAMUSCULAR | Status: DC | PRN
  Administered 2018-10-24 – 2018-10-25 (×4): 4 mg via INTRAVENOUS
  Filled 2018-10-24 (×4): qty 2

## 2018-10-24 MED ORDER — LATANOPROST 0.005 % OP SOLN
1.0000 [drp] | Freq: Every day | OPHTHALMIC | Status: DC
  Administered 2018-10-24 – 2018-10-26 (×3): 1 [drp] via OPHTHALMIC
  Filled 2018-10-24: qty 2.5

## 2018-10-24 MED ORDER — BISACODYL 5 MG PO TBEC: 5.0000 mg | DELAYED_RELEASE_TABLET | Freq: Every day | ORAL | Status: DC | PRN

## 2018-10-24 NOTE — ED Notes (Signed)
Pt ambulatory to BR with aid of crutches, no difficulty or distress noted; pt with small amount foul smelling yellow gel-like stool; sample sent to lab

## 2018-10-24 NOTE — H&P (Signed)
[place] [facility] [place] NAME: Yvonne Singleton    MR#:  [id number]  DATE OF BIRTH:  [birth year]  DATE OF ADMISSION:  10/23/2018  PRIMARY CARE PHYSICIAN: Perrin Maltese, MD   REQUESTING/REFERRING PHYSICIAN: Lavonia Drafts, MD  CHIEF COMPLAINT:   Chief Complaint  Patient presents with  . Abdominal Pain    HISTORY OF PRESENT ILLNESS:  Audi Conover  is a 84 y.o. female with a known history of Parkinson's disease p/w AP/N/V/D. Denies recent ABx use or recent hospitalization/facility encounter. Endorses onset of nausea (w/ minimal vomiting, clear liquid) Monday 10/23/2018 morning. Endorses increasing frequency of symptoms over time. Developed diffuse abdominal pain a short time later. Did not have diarrhea until arriving to [facility] ED. Denies sick contacts. Appears ill but non-toxic. Comfortable w/ minimal abdominal discomfort at the time of my assessment. CT A/P (+) "Mild fluid-filled distention of jejunal loops without mechanical bowel obstruction. Findings may represent a small bowel enteritis." C. Difficile (+).  PAST MEDICAL HISTORY:   Past Medical History:  Diagnosis Date  . Back pain   . Breast cancer ([place]) 2007   right breast lumpectomy with rad tx  . Cancer [facility]) 2007   right breast  . Collagen vascular disease ([place])   . Gout   . Hard of hearing   . Hypertension   . Parkinson's disease ([place])   . Personal history of radiation therapy 2007   F/U right breast cancer    PAST SURGICAL HISTORY:   Past Surgical History:  Procedure Laterality Date  . ABDOMINAL HYSTERECTOMY    . BREAST LUMPECTOMY Right 2007   suspicious calcs, f/u with radiation  . CATARACT EXTRACTION Bilateral   . ESOPHAGOGASTRODUODENOSCOPY (EGD) WITH PROPOFOL N/A 05/25/2017   Procedure: ESOPHAGOGASTRODUODENOSCOPY (EGD) WITH PROPOFOL;  Surgeon: Lollie Sails, MD;  Location: [facility] ENDOSCOPY;  Service: Endoscopy;  Laterality: N/A;  . ESOPHAGOGASTRODUODENOSCOPY (EGD) WITH  PROPOFOL N/A 05/09/2018   Procedure: ESOPHAGOGASTRODUODENOSCOPY (EGD) WITH PROPOFOL;  Surgeon: Lin Landsman, MD;  Location: [facility] ENDOSCOPY;  Service: Gastroenterology;  Laterality: N/A;  . EYE SURGERY Bilateral   . HAND SURGERY    . HIP CLOSED REDUCTION Left 06/24/2015   Procedure: CLOSED REDUCTION HIP;  Surgeon: Dereck Leep, MD;  Location: [facility] ORS;  Service: Orthopedics;  Laterality: Left;  . HIP CLOSED REDUCTION Left 02/09/2016   Procedure: CLOSED MANIPULATION HIP;  Surgeon: Earnestine Leys, MD;  Location: [facility] ORS;  Service: Orthopedics;  Laterality: Left;  . HIP SURGERY    . JOINT REPLACEMENT     bilateral hip  . OOPHORECTOMY    . TOTAL KNEE ARTHROPLASTY Bilateral     SOCIAL HISTORY:   Social History   Tobacco Use  . Smoking status: Never Smoker  . Smokeless tobacco: Never Used  Substance Use Topics  . Alcohol use: No    Alcohol/week: 0.0 standard drinks    FAMILY HISTORY:   Family History  Problem Relation Age of Onset  . Heart disease Mother   . Arthritis Mother   . Heart disease Father   . Ulcers Father   . Breast cancer Maternal Aunt     DRUG ALLERGIES:   Allergies  Allergen Reactions  . Trospium Nausea And Vomiting  . Iodinated Diagnostic Agents Hives  . Streptomycin Hives    REVIEW OF SYSTEMS:   Review of Systems  Constitutional: Positive for malaise/fatigue. Negative for chills and fever.  HENT: Negative for congestion, ear pain, hearing loss, nosebleeds, sinus pain, sore  throat and tinnitus.   Eyes: Negative for blurred vision, double vision and photophobia.  Respiratory: Negative for cough and shortness of breath.   Cardiovascular: Negative for chest pain.  Gastrointestinal: Positive for abdominal pain, diarrhea, nausea and vomiting.  Genitourinary: Negative for dysuria.  Musculoskeletal: Negative for back pain and myalgias.  Skin: Negative for itching and rash.  Neurological: Positive for weakness. Negative for dizziness, loss of  consciousness and headaches.  Psychiatric/Behavioral: Negative for memory loss. The patient does not have insomnia.    MEDICATIONS AT HOME:   Prior to Admission medications   Medication Sig Start Date End Date Taking? Authorizing Provider  allopurinol (ZYLOPRIM) 100 MG tablet Take 200 mg by mouth at bedtime.    Yes [provider]  b complex vitamins tablet Take 1 tablet by mouth daily.   Yes [provider]  Calcium Carbonate-Vitamin D (CALCIUM 600+D) 600-200 MG-UNIT TABS Take 1 tablet by mouth daily.   Yes [provider]  carbidopa-levodopa (SINEMET CR) 50-200 MG per tablet Take 1 tablet by mouth at bedtime.    Yes [provider]  cholecalciferol (VITAMIN D) 1000 units tablet Take 2,000 Units by mouth daily.   Yes [provider]  DULoxetine (CYMBALTA) 30 MG capsule Take 1 capsule by mouth daily. 08/05/18  Yes [provider]  furosemide (LASIX) 20 MG tablet Take 30 mg by mouth.   Yes [provider]  gabapentin (NEURONTIN) 100 MG capsule Limit 1 capsule by mouth per day or every other day if tolerated Patient taking differently: Take 100 mg by mouth 2 (two) times daily.  06/17/16  Yes Mohammed Kindle, MD  hydroxychloroquine (PLAQUENIL) 200 MG tablet Take 400 mg by mouth at bedtime.    Yes [provider]  latanoprost (XALATAN) 0.005 % ophthalmic solution Place 1 drop into the right eye at bedtime.  05/26/18  Yes [provider]  losartan (COZAAR) 100 MG tablet Take 100 mg by mouth daily.    Yes [provider]  oxyCODONE (OXY IR/ROXICODONE) 5 MG immediate release tablet Take 1 tablet by mouth 3 (three) times daily as needed. 07/28/18  Yes [provider]  pantoprazole (PROTONIX) 40 MG tablet Take 1 tablet by mouth 2 (two) times daily. 10/16/18  Yes [provider]  spironolactone (ALDACTONE) 25 MG tablet Take 1 tablet by mouth daily. 07/24/18  Yes [provider]  tolterodine  (DETROL LA) 4 MG 24 hr capsule Take 4 mg by mouth daily.   Yes [provider]  metroNIDAZOLE (METROGEL) 0.75 % vaginal gel metronidazole 0.75 % vaginal gel    [provider]  omeprazole (PRILOSEC) 40 MG capsule Take 1 capsule (40 mg total) by mouth daily before breakfast. Patient not taking: Reported on 10/23/2018 05/09/18 10/23/18  Lin Landsman, MD      VITAL SIGNS:  Blood pressure (!) 111/57, pulse 66, temperature 98.4 F (36.9 C), temperature source Oral, resp. rate 18, height 5' 3.5" (1.613 m), weight 75.8 kg, SpO2 98 %.  PHYSICAL EXAMINATION:  Physical Exam Constitutional:      General: She is not in acute distress.    Appearance: She is well-developed and normal weight. She is ill-appearing. She is not toxic-appearing or diaphoretic.  HENT:     Head: Atraumatic.  Eyes:     General: No scleral icterus.    Extraocular Movements: Extraocular movements intact.     Conjunctiva/sclera: Conjunctivae normal.  Neck:     Musculoskeletal: Neck supple.  Cardiovascular:  Rate and Rhythm: Normal rate and regular rhythm.     Heart sounds: Normal heart sounds. No murmur. No friction rub. No gallop.   Pulmonary:     Effort: Pulmonary effort is normal. No respiratory distress.     Breath sounds: Normal breath sounds. No stridor. No wheezing, rhonchi or rales.  Abdominal:     General: There is no distension.     Palpations: Abdomen is soft.     Tenderness: There is no abdominal tenderness. There is no guarding or rebound.  Musculoskeletal: Normal range of motion.        General: No swelling or tenderness.     Right lower leg: No edema.     Left lower leg: No edema.  Lymphadenopathy:     Cervical: No cervical adenopathy.  Skin:    General: Skin is warm and dry.     Findings: No erythema or rash.  Neurological:     General: No focal deficit present.     Mental Status: She is alert and oriented to person, place, and time.  Psychiatric:        Mood and Affect:  Mood normal.        Behavior: Behavior normal.        Thought Content: Thought content normal.        Judgment: Judgment normal.    LABORATORY PANEL:   CBC Recent Labs  Lab 10/23/18 1736  WBC 21.3*  HGB 14.8  HCT 46.1*  PLT 298   ------------------------------------------------------------------------------------------------------------------  Chemistries  Recent Labs  Lab 10/23/18 1736 10/24/18 0538  NA 137  --   K 4.6  --   CL 107  --   CO2 22  --   GLUCOSE 154*  --   BUN 33*  --   CREATININE 1.01*  --   CALCIUM 9.6  --   MG  --  1.7  AST 33  --   ALT 12  --   ALKPHOS 171*  --   BILITOT 0.6  --    ------------------------------------------------------------------------------------------------------------------  Cardiac Enzymes Recent Labs  Lab 10/23/18 1736  TROPONINI <0.03   ------------------------------------------------------------------------------------------------------------------  RADIOLOGY:  Ct Abdomen Pelvis Wo Contrast  Result Date: 10/23/2018 CLINICAL DATA:  Upper abdominal pain with nausea and belching today. EXAM: CT ABDOMEN AND PELVIS WITHOUT CONTRAST TECHNIQUE: Multidetector CT imaging of the abdomen and pelvis was performed following the standard protocol without IV contrast. COMPARISON:  09/28/2016 FINDINGS: Lower chest: Top-normal size heart with coronary arteriosclerosis along the included left main, lad and circumflex arteries. No pericardial effusion or thickening. No acute pulmonary abnormality. Hepatobiliary: No focal liver abnormality is seen. Status post cholecystectomy. No biliary dilatation. Pancreas: Unremarkable. No pancreatic ductal dilatation or surrounding inflammatory changes. Spleen: Normal in size without focal abnormality. Adrenals/Urinary Tract: Normal bilateral adrenal glands. The unenhanced kidneys demonstrate no nephrolithiasis nor hydroureteronephrosis. No definite mass given limitations of a noncontrast study. Urinary  bladder is unremarkable for the degree of distention. Stomach/Bowel: Stable moderate to large hiatal hernia. The stomach, duodenal sweep and ligament of Treitz are notable for a duodenal diverticulum measuring to 4.8 cm at the junction of the second and third portion. Scattered mild fluid distention of jejunal loops without mechanical bowel obstruction noted. Findings may represent small bowel enteritis. Liquid stool is seen within the colon with scattered areas of peristalsis suggested accounting for areas of luminal narrowing and fold thickening. The rectosigmoid is limited in assessment due to artifacts from the patient's hip arthroplasties. The appendix is not well  visualized but no pericecal inflammation seen. Vascular/Lymphatic: Moderate aortoiliac atherosclerosis. No lymphadenopathy by CT size criteria. Reproductive: Status post hysterectomy. No adnexal masses. Other: Moderate fat containing right infraumbilical ventral hernia without incarceration. Small fat containing periumbilical hernia also without incarceration. Musculoskeletal: Bilateral hip arthroplasties. Thoracolumbar spondylosis with dextroconvex scoliosis. IMPRESSION: 1. Mild fluid-filled distention of jejunal loops without mechanical bowel obstruction. Findings may represent a small bowel enteritis. 2. Stable moderate to large hiatal hernia. 3. Duodenal diverticulum measuring 4.8 cm at the junction of the second and third portion. 4. Moderate fat containing right infraumbilical ventral hernia without incarceration. Small fat containing periumbilical hernia. Aortic Atherosclerosis (ICD10-I70.0). Electronically Signed   By: Ashley Royalty M.D.   On: 10/23/2018 20:09   IMPRESSION AND PLAN:   A/P: 67F w/ PMHx Parkinson's disease p/w AP/N/V/D. C. Difficile (+). Hyperglycemia, APhos elevation, leukocytosis, macrocytosis. -AP/N/V/D,  possible small bowel enteritis, C. difficile infxn, leukocytosis: (+) WBC, however afebrile, SIRS (-). IVF, symptomatic  mgmt, antiemetic, pain ctrl. IVF. PO Vancomycin. -c/w home meds/formulary subs. -FEN/GI: Cardiac diet. -DVT PPx: Lovenox. -Code status: Full code. -Disposition: Admission, > 2 midnights.   All the records are reviewed and case discussed with ED provider. Management plans discussed with the patient, family and they are in agreement.  CODE STATUS: Full code.  TOTAL TIME TAKING CARE OF THIS PATIENT: 75 minutes.    Arta Silence M.D on 10/24/2018 at 9:11 AM  Between 7am to 6pm - Pager - [phone]  After 6pm go to [email or web address] - Proofreader  [facility] [place] [facility]  Office  [phone]  CC: Primary care physician; Perrin Maltese, MD   Note: This dictation was prepared with Dragon dictation along with smaller phrase technology. Any transcriptional errors that result from this process are unintentional.

## 2018-10-24 NOTE — ED Notes (Signed)
Pt ambulated to the bathroom with the help of crutches and her husband. Pt returned to her bed without difficulty. Pt changed her own diaper per her request.

## 2018-10-24 NOTE — Progress Notes (Signed)
[facility] [facility] [place] NAME: Finleigh Cheong    MR#:  [id number]  DATE OF BIRTH:  Mar 29, 1935  SUBJECTIVE:  CHIEF COMPLAINT:   Chief Complaint  Patient presents with  . Abdominal Pain   Patient reported having diarrhea at least 5 times overnight.  No abdominal pain this morning.  Found to have C. difficile positive.  Started on p.o. vancomycin  REVIEW OF SYSTEMS:  Review of Systems  Constitutional: Negative for chills and fever.  HENT: Negative for ear discharge and hearing loss.   Eyes: Negative for blurred vision and double vision.  Respiratory: Negative for cough and hemoptysis.   Cardiovascular: Negative for chest pain and palpitations.  Gastrointestinal: Positive for diarrhea, nausea and vomiting. Negative for abdominal pain and heartburn.  Genitourinary: Negative for dysuria and urgency.  Musculoskeletal: Negative for myalgias and neck pain.  Skin: Negative for itching and rash.  Neurological: Negative for dizziness and headaches.  Psychiatric/Behavioral: Negative for depression and hallucinations.    DRUG ALLERGIES:   Allergies  Allergen Reactions  . Trospium Nausea And Vomiting  . Iodinated Diagnostic Agents Hives  . Streptomycin Hives   VITALS:  Blood pressure (!) 111/57, pulse 66, temperature 98.4 F (36.9 C), temperature source Oral, resp. rate 18, height 5' 3.5" (1.613 m), weight 75.8 kg, SpO2 98 %. PHYSICAL EXAMINATION:  Physical Exam  Constitutional: She is oriented to person, place, and time. She appears well-developed and well-nourished.  HENT:  Head: Normocephalic and atraumatic.  Eyes: Pupils are equal, round, and reactive to light. Conjunctivae are normal.  Neck: Normal range of motion. Neck supple. No thyromegaly present.  Cardiovascular: Normal rate, regular rhythm and normal heart sounds.  Respiratory: Effort normal and breath sounds normal.  GI: Soft. Bowel sounds are normal. She exhibits no distension.  There is no abdominal tenderness.  Musculoskeletal: Normal range of motion.        General: No edema.  Neurological: She is alert and oriented to person, place, and time.  Skin: Skin is warm and dry.  Psychiatric: She has a normal mood and affect. Her behavior is normal.    LABORATORY PANEL:  Female CBC Recent Labs  Lab 10/24/18 0826  WBC 12.1*  HGB 13.0  HCT 40.4  PLT 235   ------------------------------------------------------------------------------------------------------------------ Chemistries  Recent Labs  Lab 10/23/18 1736  10/24/18 0826  NA 137  --  137  K 4.6  --  3.6  CL 107  --  111  CO2 22  --  18*  GLUCOSE 154*  --  75  BUN 33*  --  42*  CREATININE 1.01*  --  1.34*  CALCIUM 9.6  --  8.8*  MG  --    < > 1.8  AST 33  --   --   ALT 12  --   --   ALKPHOS 171*  --   --   BILITOT 0.6  --   --    < > = values in this interval not displayed.   RADIOLOGY:  Ct Abdomen Pelvis Wo Contrast  Result Date: 10/23/2018 CLINICAL DATA:  Upper abdominal pain with nausea and belching today. EXAM: CT ABDOMEN AND PELVIS WITHOUT CONTRAST TECHNIQUE: Multidetector CT imaging of the abdomen and pelvis was performed following the standard protocol without IV contrast. COMPARISON:  09/28/2016 FINDINGS: Lower chest: Top-normal size heart with coronary arteriosclerosis along the included left main, lad and circumflex arteries. No pericardial effusion or thickening. No acute pulmonary abnormality. Hepatobiliary: No  focal liver abnormality is seen. Status post cholecystectomy. No biliary dilatation. Pancreas: Unremarkable. No pancreatic ductal dilatation or surrounding inflammatory changes. Spleen: Normal in size without focal abnormality. Adrenals/Urinary Tract: Normal bilateral adrenal glands. The unenhanced kidneys demonstrate no nephrolithiasis nor hydroureteronephrosis. No definite mass given limitations of a noncontrast study. Urinary bladder is unremarkable for the degree of distention.  Stomach/Bowel: Stable moderate to large hiatal hernia. The stomach, duodenal sweep and ligament of Treitz are notable for a duodenal diverticulum measuring to 4.8 cm at the junction of the second and third portion. Scattered mild fluid distention of jejunal loops without mechanical bowel obstruction noted. Findings may represent small bowel enteritis. Liquid stool is seen within the colon with scattered areas of peristalsis suggested accounting for areas of luminal narrowing and fold thickening. The rectosigmoid is limited in assessment due to artifacts from the patient's hip arthroplasties. The appendix is not well visualized but no pericecal inflammation seen. Vascular/Lymphatic: Moderate aortoiliac atherosclerosis. No lymphadenopathy by CT size criteria. Reproductive: Status post hysterectomy. No adnexal masses. Other: Moderate fat containing right infraumbilical ventral hernia without incarceration. Small fat containing periumbilical hernia also without incarceration. Musculoskeletal: Bilateral hip arthroplasties. Thoracolumbar spondylosis with dextroconvex scoliosis. IMPRESSION: 1. Mild fluid-filled distention of jejunal loops without mechanical bowel obstruction. Findings may represent a small bowel enteritis. 2. Stable moderate to large hiatal hernia. 3. Duodenal diverticulum measuring 4.8 cm at the junction of the second and third portion. 4. Moderate fat containing right infraumbilical ventral hernia without incarceration. Small fat containing periumbilical hernia. Aortic Atherosclerosis (ICD10-I70.0). Electronically Signed   By: Ashley Royalty M.D.   On: 10/23/2018 20:09   ASSESSMENT AND PLAN:   A/P: 66F w/ PMHx Parkinson's disease p/w AP/N/V/D. C. Difficile (+). Hyperglycemia, APhos elevation, leukocytosis,  1.  C. difficile diarrhea.  Patient presented with nausea and vomiting with diarrhea.  C. difficile positive.  Patient symptomatic with diarrhea and leukocytosis with white count of 21,000 on  presentation.  Started on p.o. vancomycin 125 mg 4 times daily.  Leukocytosis gradually improving.  CT scan with evidence of enteritis   Placed on IV fluid hydration.  Monitor clinically.  If no improvement will request will infectious disease consultation.   2.  Acute kidney injury Secondary to diarrhea.  Started on IV fluids.  Follow-up on renal function in a.m.  -DVT PPx: Lovenox. -Code status: Full code.  All the records are reviewed and case discussed with Care Management/Social Worker. Management plans discussed with the patient, family and they are in agreement.  CODE STATUS: Full Code  TOTAL TIME TAKING CARE OF THIS PATIENT: 35 minutes.   More than 50% of the time was spent in counseling/coordination of care: YES  POSSIBLE D/C IN 2 DAYS, DEPENDING ON CLINICAL CONDITION.   Joselito Fieldhouse M.D on 10/24/2018 at 2:48 PM  Between 7am to 6pm - Pager - [phone]  After 6pm go to [email or web address] - Proofreader  [facility]  Office  [phone]  CC: Primary care physician; Perrin Maltese, MD  Note: This dictation was prepared with Dragon dictation along with smaller phrase technology. Any transcriptional errors that result from this process are unintentional.

## 2018-10-24 NOTE — Care Management Note (Deleted)
Case Management Note  Patient Details  Name: Yvonne Singleton MRN: [id number] Date of Birth: March 20, 1935  Subjective/Objective:                  Met with patient and spouse about discharge plans Provided the patient with the [facility] list  Patient is current on service with [facility] and wants to continue using it Patient has 24 hour care with 2 caregivers Patient uses [facility] Patient can afford medications Patient has a shower chair a bedside commode has a rolling walker and a rollator Patient has a care taker take her to the Doctor   Action/Plan: [facility] list provided per CMS.gov  Expected Discharge Date:                  Expected Discharge Plan:     In-House Referral:     Discharge planning Services  CM Consult  Post Acute Care Choice:    Choice offered to:     DME Arranged:    DME Agency:     HH Arranged:  PT HH Agency:  [facility]  Status of Service:  In process, will continue to follow  If discussed at Long Length of Stay Meetings, dates discussed:    Additional Comments:  Su Hilt, RN 10/24/2018, 4:05 PM

## 2018-10-24 NOTE — Care Management Note (Signed)
Case Management Note  Patient Details  Name: Yvonne Singleton MRN: [id number] Date of Birth: May 22, 1935  Subjective/Objective:                  Met with patient and family to discuss discharge plan Patient has had HH in the past but not currently, provided the patient with the list of [facility] agencies  Patient has Parkison's and it causes her to be tired Patient lives with husband Patient still drives and husband is the back up driver Patient uses [facility] as pharmacy Patient can afford medications ok Patient sees Dr. Chancy Milroy PCP Patient uses 2 crutches at home and has for years Has tried stand up walker with PT but was not able to use it.   Action/Plan:  [facility] list provided per CMS.gov  Expected Discharge Date:                  Expected Discharge Plan:     In-House Referral:     Discharge planning Services  CM Consult  Post Acute Care Choice:    Choice offered to:     DME Arranged:    DME Agency:  [place]:  PT [facility] Agency:     Status of Service:  In process, will continue to follow  If discussed at Long Length of Stay Meetings, dates discussed:    Additional Comments:  Su Hilt, RN 10/24/2018, 3:51 PM

## 2018-10-25 LAB — CBC
HCT: 42.5 % (ref 36.0–46.0)
Hemoglobin: 13.6 g/dL (ref 12.0–15.0)
MCH: 32.5 pg (ref 26.0–34.0)
MCHC: 32 g/dL (ref 30.0–36.0)
MCV: 101.4 fL — ABNORMAL HIGH (ref 80.0–100.0)
Platelets: 264 10*3/uL (ref 150–400)
RBC: 4.19 MIL/uL (ref 3.87–5.11)
RDW: 14.3 % (ref 11.5–15.5)
WBC: 8.9 10*3/uL (ref 4.0–10.5)
nRBC: 0 % (ref 0.0–0.2)

## 2018-10-25 LAB — BASIC METABOLIC PANEL
Anion gap: 8 (ref 5–15)
BUN: 30 mg/dL — ABNORMAL HIGH (ref 8–23)
CHLORIDE: 113 mmol/L — AB (ref 98–111)
CO2: 17 mmol/L — ABNORMAL LOW (ref 22–32)
Calcium: 8.9 mg/dL (ref 8.9–10.3)
Creatinine, Ser: 1.01 mg/dL — ABNORMAL HIGH (ref 0.44–1.00)
GFR calc Af Amer: >60 mL/min (ref >60)
GFR calc non Af Amer: 52 mL/min — ABNORMAL LOW (ref >60)
Glucose, Bld: 75 mg/dL (ref 70–99)
Potassium: 3.8 mmol/L (ref 3.5–5.1)
Sodium: 138 mmol/L (ref 135–145)

## 2018-10-25 LAB — MAGNESIUM: Magnesium: 1.6 mg/dL — ABNORMAL LOW (ref 1.7–2.4)

## 2018-10-25 LAB — GAMMA GT: GGT: 70 U/L — ABNORMAL HIGH (ref 7–50)

## 2018-10-25 NOTE — Plan of Care (Signed)

## 2018-10-25 NOTE — Care Management (Signed)
Patient is ambulating with crutches chronically with parkinson's, States that she is getting week, notified Dr. Stark Jock that she may need a PT consult for DME needsd

## 2018-10-25 NOTE — Progress Notes (Signed)
[place] [facility] [place] NAME: Yvonne Singleton    MR#:  [id number]  DATE OF BIRTH:  02/28/1935  SUBJECTIVE:  CHIEF COMPLAINT:   Chief Complaint  Patient presents with  . Abdominal Pain   Patient reported having diarrhea at least 6-8 times overnight.  No abdominal pain this morning.  Found to have C. difficile positive.  Started on p.o. vancomycin recently.  REVIEW OF SYSTEMS:  Review of Systems  Constitutional: Negative for chills and fever.  HENT: Negative for ear discharge and hearing loss.   Eyes: Negative for blurred vision and double vision.  Respiratory: Negative for cough and hemoptysis.   Cardiovascular: Negative for chest pain and palpitations.  Gastrointestinal: Positive for diarrhea. Negative for abdominal pain, heartburn, nausea and vomiting.  Genitourinary: Negative for dysuria and urgency.  Musculoskeletal: Negative for myalgias and neck pain.  Skin: Negative for itching and rash.  Neurological: Negative for dizziness and headaches.  Psychiatric/Behavioral: Negative for depression and hallucinations.    DRUG ALLERGIES:   Allergies  Allergen Reactions  . Trospium Nausea And Vomiting  . Iodinated Diagnostic Agents Hives  . Streptomycin Hives   VITALS:  Blood pressure (!) 118/57, pulse 64, temperature (!) 97.4 F (36.3 C), temperature source Oral, resp. rate 18, height 5' 3.5" (1.613 m), weight 75.8 kg, SpO2 95 %. PHYSICAL EXAMINATION:  Physical Exam  Constitutional: She is oriented to person, place, and time. She appears well-developed and well-nourished.  HENT:  Head: Normocephalic and atraumatic.  Eyes: Pupils are equal, round, and reactive to light. Conjunctivae are normal.  Neck: Normal range of motion. Neck supple. No thyromegaly present.  Cardiovascular: Normal rate, regular rhythm and normal heart sounds.  Respiratory: Effort normal and breath sounds normal.  GI: Soft. Bowel sounds are normal. She exhibits no  distension. There is no abdominal tenderness.  Musculoskeletal: Normal range of motion.        General: No edema.  Neurological: She is alert and oriented to person, place, and time.  Skin: Skin is warm and dry.  Psychiatric: She has a normal mood and affect. Her behavior is normal.    LABORATORY PANEL:  Female CBC Recent Labs  Lab 10/25/18 0522  WBC 8.9  HGB 13.6  HCT 42.5  PLT 264   ------------------------------------------------------------------------------------------------------------------ Chemistries  Recent Labs  Lab 10/23/18 1736  10/25/18 0522  NA 137   < > 138  K 4.6   < > 3.8  CL 107   < > 113*  CO2 22   < > 17*  GLUCOSE 154*   < > 75  BUN 33*   < > 30*  CREATININE 1.01*   < > 1.01*  CALCIUM 9.6   < > 8.9  MG  --    < > 1.6*  AST 33  --   --   ALT 12  --   --   ALKPHOS 171*  --   --   BILITOT 0.6  --   --    < > = values in this interval not displayed.   RADIOLOGY:  No results found. ASSESSMENT AND PLAN:   A/P: 73F w/ PMHx Parkinson's disease p/w AP/N/V/D. C. Difficile (+). Hyperglycemia, APhos elevation, leukocytosis,  1.  C. difficile diarrhea.  Patient presented with nausea and vomiting with diarrhea.  Patient had significant leukocytosis on presentation with white blood cell count as high as 21,000.  C. difficile difficile antigen positive but C. difficile toxin negative. Based on patient's  symptoms , initiated treatment for C. difficile with p.o. vancomycin 125 mg p.o 4 times daily.  Leukocytosis resolved.  Patient however still having persistent diarrhea.  Remains afebrile.  Requested for infectious disease consultation for input today.   CT scan with evidence of enteritis   Placed on IV fluid hydration.  Monitor clinically.  2.  Acute kidney injury Secondary to diarrhea.  Renal function back to normal with IV fluid hydration.  3.  Debility due to multiple medical problems. Physical therapy consult placed to evaluate and treat.  -DVT PPx:  Lovenox. -Code status: Full code.  All the records are reviewed and case discussed with Care Management/Social Worker. Management plans discussed with the patient, family and they are in agreement.  CODE STATUS: Full Code  TOTAL TIME TAKING CARE OF THIS PATIENT: 36 minutes.   More than 50% of the time was spent in counseling/coordination of care: YES  POSSIBLE D/C IN 2 DAYS, DEPENDING ON CLINICAL CONDITION.   Yvonne Singleton M.D on 10/25/2018 at 2:01 PM  Between 7am to 6pm - Pager - [phone]  After 6pm go to [email or web address] - Proofreader  [facility]  Office  [phone]  CC: Primary care physician; Perrin Maltese, MD  Note: This dictation was prepared with Dragon dictation along with smaller phrase technology. Any transcriptional errors that result from this process are unintentional.

## 2018-10-26 LAB — CBC
HEMATOCRIT: 36.4 % (ref 36.0–46.0)
Hemoglobin: 11.8 g/dL — ABNORMAL LOW (ref 12.0–15.0)
MCH: 32.4 pg (ref 26.0–34.0)
MCHC: 32.4 g/dL (ref 30.0–36.0)
MCV: 100 fL (ref 80.0–100.0)
NRBC: 0 % (ref 0.0–0.2)
Platelets: 204 10*3/uL (ref 150–400)
RBC: 3.64 MIL/uL — ABNORMAL LOW (ref 3.87–5.11)
RDW: 14.2 % (ref 11.5–15.5)
WBC: 6.9 10*3/uL (ref 4.0–10.5)

## 2018-10-26 LAB — BASIC METABOLIC PANEL
Anion gap: 7 (ref 5–15)
BUN: 20 mg/dL (ref 8–23)
CHLORIDE: 112 mmol/L — AB (ref 98–111)
CO2: 18 mmol/L — ABNORMAL LOW (ref 22–32)
CREATININE: 0.74 mg/dL (ref 0.44–1.00)
Calcium: 8.4 mg/dL — ABNORMAL LOW (ref 8.9–10.3)
GFR calc Af Amer: >60 mL/min (ref >60)
GFR calc non Af Amer: >60 mL/min (ref >60)
Glucose, Bld: 62 mg/dL — ABNORMAL LOW (ref 70–99)
Potassium: 3.4 mmol/L — ABNORMAL LOW (ref 3.5–5.1)
Sodium: 137 mmol/L (ref 135–145)

## 2018-10-26 LAB — PHOSPHORUS: PHOSPHORUS: 2.5 mg/dL (ref 2.5–4.6)

## 2018-10-26 LAB — MAGNESIUM: Magnesium: 1.6 mg/dL — ABNORMAL LOW (ref 1.7–2.4)

## 2018-10-26 MED ORDER — POTASSIUM CHLORIDE CRYS ER 20 MEQ PO TBCR
40.0000 meq | EXTENDED_RELEASE_TABLET | Freq: Once | ORAL | Status: AC
  Administered 2018-10-26: 40 meq via ORAL
  Filled 2018-10-26: qty 2

## 2018-10-26 MED ORDER — MAGNESIUM OXIDE 400 (241.3 MG) MG PO TABS
800.0000 mg | ORAL_TABLET | Freq: Once | ORAL | Status: AC
  Administered 2018-10-26: 800 mg via ORAL
  Filled 2018-10-26: qty 2

## 2018-10-26 MED ORDER — OXYCODONE HCL 5 MG PO TABS
5.0000 mg | ORAL_TABLET | Freq: Three times a day (TID) | ORAL | Status: DC | PRN
  Administered 2018-10-26: 5 mg via ORAL
  Filled 2018-10-26: qty 1

## 2018-10-26 NOTE — Progress Notes (Signed)
[place] [facility] [place] NAME: Shaylynn Nulty    MR#:  [id number]  DATE OF BIRTH:  1935-03-04  SUBJECTIVE:  CHIEF COMPLAINT:   Chief Complaint  Patient presents with  . Abdominal Pain   Patient reported having diarrhea at least 8 times overnight.  No abdominal pain this morning.  Found to have C. difficile positive.  Started on p.o. vancomycin recently.  REVIEW OF SYSTEMS:  Review of Systems  Constitutional: Negative for chills and fever.  HENT: Negative for ear discharge and hearing loss.   Eyes: Negative for blurred vision and double vision.  Respiratory: Negative for cough and hemoptysis.   Cardiovascular: Negative for chest pain and palpitations.  Gastrointestinal: Positive for diarrhea. Negative for abdominal pain, heartburn, nausea and vomiting.  Genitourinary: Negative for dysuria and urgency.  Musculoskeletal: Negative for myalgias and neck pain.  Skin: Negative for itching and rash.  Neurological: Negative for dizziness and headaches.  Psychiatric/Behavioral: Negative for depression and hallucinations.    DRUG ALLERGIES:   Allergies  Allergen Reactions  . Trospium Nausea And Vomiting  . Iodinated Diagnostic Agents Hives  . Streptomycin Hives   VITALS:  Blood pressure 138/63, pulse (!) 54, temperature 98.4 F (36.9 C), temperature source Oral, resp. rate 18, height 5' 3.5" (1.613 m), weight 75.8 kg, SpO2 97 %. PHYSICAL EXAMINATION:  Physical Exam  Constitutional: She is oriented to person, place, and time. She appears well-developed and well-nourished.  HENT:  Head: Normocephalic and atraumatic.  Eyes: Pupils are equal, round, and reactive to light. Conjunctivae are normal.  Neck: Normal range of motion. Neck supple. No thyromegaly present.  Cardiovascular: Normal rate, regular rhythm and normal heart sounds.  Respiratory: Effort normal and breath sounds normal.  GI: Soft. Bowel sounds are normal. She exhibits no  distension. There is no abdominal tenderness.  Musculoskeletal: Normal range of motion.        General: No edema.  Neurological: She is alert and oriented to person, place, and time.  Skin: Skin is warm and dry.  Psychiatric: She has a normal mood and affect. Her behavior is normal.    LABORATORY PANEL:  Female CBC Recent Labs  Lab 10/26/18 0340  WBC 6.9  HGB 11.8*  HCT 36.4  PLT 204   ------------------------------------------------------------------------------------------------------------------ Chemistries  Recent Labs  Lab 10/23/18 1736  10/26/18 0340  NA 137   < > 137  K 4.6   < > 3.4*  CL 107   < > 112*  CO2 22   < > 18*  GLUCOSE 154*   < > 62*  BUN 33*   < > 20  CREATININE 1.01*   < > 0.74  CALCIUM 9.6   < > 8.4*  MG  --    < > 1.6*  AST 33  --   --   ALT 12  --   --   ALKPHOS 171*  --   --   BILITOT 0.6  --   --    < > = values in this interval not displayed.   RADIOLOGY:  No results found. ASSESSMENT AND PLAN:   A/P: 22F w/ PMHx Parkinson's disease p/w AP/N/V/D. C. Difficile (+). Hyperglycemia, APhos elevation, leukocytosis,  1.  C. difficile diarrhea.  Patient presented with nausea and vomiting with diarrhea.  Patient had significant leukocytosis on presentation with white blood cell count as high as 21,000.  C. difficile difficile antigen positive but C. difficile toxin negative. Based on patient's symptoms ,  initiated treatment for C. difficile with p.o. vancomycin 125 mg p.o 4 times daily.  Leukocytosis resolved.   Patient however still having persistent diarrhea.  Remains afebrile.  I discussed case with infectious disease specialist yesterday.  Please consult infectious disease tomorrow if no improvement in diarrhea with ongoing treatment with vancomycin. CT scan with evidence of enteritis   Placed on IV fluid hydration.  Monitor clinically.  2.  Acute kidney injury Secondary to diarrhea.  Renal function back to normal with IV fluid hydration.  3.   Debility due to multiple medical problems. Patient seen by physical therapist.  No further physical therapy therapy recommended  -DVT PPx: Lovenox. -Code status: Full code.  All the records are reviewed and case discussed with Care Management/Social Worker. Management plans discussed with the patient, family and they are in agreement.  CODE STATUS: Full Code  TOTAL TIME TAKING CARE OF THIS PATIENT: 34 minutes.   More than 50% of the time was spent in counseling/coordination of care: YES  POSSIBLE D/C IN 2 DAYS, DEPENDING ON CLINICAL CONDITION.   Kallee Nam M.D on 10/26/2018 at 1:05 PM  Between 7am to 6pm - Pager - [phone]  After 6pm go to [email or web address] - Proofreader  [facility]  Office  [phone]  CC: Primary care physician; Perrin Maltese, MD  Note: This dictation was prepared with Dragon dictation along with smaller phrase technology. Any transcriptional errors that result from this process are unintentional.

## 2018-10-26 NOTE — Care Management (Signed)
Per PT this patient is ok with using her current Axillary crutches at home.  NO follow up needed for DME

## 2018-10-26 NOTE — Evaluation (Signed)
Physical Therapy Evaluation Patient Details Name: Yvonne Singleton MRN: [id number] DOB: 01-07-1935 Today's Date: 10/26/2018   History of Present Illness  Pt is an 84 y.o. female presenting to hospital with N/V/abdominal pain/diarrhea.  Pt admitted with (+) c-diff and AKI.  PMH includes B TKA, B THA (dislocated on R 3x's and L 2x's; no dislocations in last 4-5 years), large hiatal hernia, breast CA s/p lumpectomy R, HOH, Parkinson disease, RA.  Clinical Impression  Prior to hospital admission, pt was ambulatory household distances with B axillary crutches.  Pt lives with her husband in 1 level home with 2 steps and B railings to enter home from back.  Currently pt is modified independent semi-supine to sit; SBA with transfers; and CGA ambulating 40 feet in room with B axillary crutches (pt declined to walk further d/t concerns it would make her need to go to bathroom; pt reports ambulating to bathroom with staff assist during hospital stay).  Pt steady with functional mobility using B axillary crutches but demonstrating generalized weakness  Pt would benefit from skilled PT to address noted impairments and functional limitations during hospital stay (see below for any additional details).  Upon hospital discharge, no further PT needs anticipated.    Follow Up Recommendations No PT follow up    Equipment Recommendations  Crutches(pt has own axillary crutches for home use)    Recommendations for Other Services       Precautions / Restrictions Precautions Precautions: Fall Restrictions Weight Bearing Restrictions: No      Mobility  Bed Mobility Overal bed mobility: Modified Independent             General bed mobility comments: Semi-supine to sit with HOB elevated without any noted difficulty.  Transfers Overall transfer level: Needs assistance Equipment used: Crutches Transfers: Sit to/from Stand Sit to Stand: Supervision         General transfer comment: fairly strong  stand and controlled sit; steady  Ambulation/Gait Ambulation/Gait assistance: Min guard Gait Distance (Feet): 40 Feet Assistive device: Crutches   Gait velocity: mildly decreased   General Gait Details: partial step through gait pattern; steady with B axillary crutches  Stairs            Wheelchair Mobility    Modified Rankin (Stroke Patients Only)       Balance Overall balance assessment: Needs assistance Sitting-balance support: No upper extremity supported;Feet supported Sitting balance-Leahy Scale: Good Sitting balance - Comments: steady sitting reaching within BOS   Standing balance support: No upper extremity supported Standing balance-Leahy Scale: Good Standing balance comment: steady standing reaching within BOS                             Pertinent Vitals/Pain Pain Assessment: 0-10 Pain Score: 3  Pain Location: general chronic pain all over Pain Descriptors / Indicators: Sore Pain Intervention(s): Limited activity within patient's tolerance;Monitored during session;Repositioned    Home Living Family/patient expects to be discharged to:: Private residence Living Arrangements: Spouse/significant other Available Help at Discharge: Family Type of Home: House Home Access: Stairs to enter Entrance Stairs-Rails: Right;Left;Can reach both Technical brewer of Steps: 2 Home Layout: One level Home Equipment: Crutches      Prior Function Level of Independence: Independent with assistive device(s)         Comments: Pt ambulatory with B axillary crutches; reports no falls in past 6 months.  (+) driving.  Walks household distances.     Hand Dominance  Extremity/Trunk Assessment   Upper Extremity Assessment Upper Extremity Assessment: Generalized weakness    Lower Extremity Assessment Lower Extremity Assessment: Generalized weakness    Cervical / Trunk Assessment Cervical / Trunk Assessment: Normal  Communication    Communication: HOH  Cognition Arousal/Alertness: Awake/alert Behavior During Therapy: WFL for tasks assessed/performed Overall Cognitive Status: Within Functional Limits for tasks assessed                                        General Comments   Nursing cleared pt for participation in physical therapy.  Pt agreeable to PT session.  Pt's husband present during session.    Exercises     Assessment/Plan    PT Assessment Patient needs continued PT services  PT Problem List Decreased strength;Decreased activity tolerance;Decreased mobility       PT Treatment Interventions DME instruction;Gait training;Stair training;Functional mobility training;Therapeutic activities;Therapeutic exercise;Balance training;Patient/family education    PT Goals (Current goals can be found in the Care Plan section)  Acute Rehab PT Goals Patient Stated Goal: to improve strength and walking PT Goal Formulation: With patient Time For Goal Achievement: 11/09/18 Potential to Achieve Goals: Good    Frequency Min 2X/week   Barriers to discharge        Co-evaluation               AM-PAC PT "6 Clicks" Mobility  Outcome Measure Help needed turning from your back to your side while in a flat bed without using bedrails?: None Help needed moving from lying on your back to sitting on the side of a flat bed without using bedrails?: None Help needed moving to and from a bed to a chair (including a wheelchair)?: A Little Help needed standing up from a chair using your arms (e.g., wheelchair or bedside chair)?: A Little Help needed to walk in hospital room?: A Little Help needed climbing 3-5 steps with a railing? : A Little 6 Click Score: 20    End of Session Equipment Utilized During Treatment: Gait belt Activity Tolerance: Patient tolerated treatment well Patient left: in chair;with call bell/phone within reach;with chair alarm set;with nursing/sitter in room;with family/visitor  present Nurse Communication: Mobility status;Precautions PT Visit Diagnosis: Muscle weakness (generalized) (M62.81)    Time: 4496-7591 PT Time Calculation (min) (ACUTE ONLY): 22 min   Charges:   PT Evaluation $PT Eval Low Complexity: 1 Low         Ayauna Mcnay, PT 10/26/18, 9:53 AM [phone]

## 2018-10-27 LAB — BASIC METABOLIC PANEL
Anion gap: 4 — ABNORMAL LOW (ref 5–15)
BUN: 12 mg/dL (ref 8–23)
CALCIUM: 8.5 mg/dL — AB (ref 8.9–10.3)
CO2: 19 mmol/L — ABNORMAL LOW (ref 22–32)
Chloride: 116 mmol/L — ABNORMAL HIGH (ref 98–111)
Creatinine, Ser: 0.77 mg/dL (ref 0.44–1.00)
GFR calc Af Amer: >60 mL/min (ref >60)
GFR calc non Af Amer: >60 mL/min (ref >60)
Glucose, Bld: 77 mg/dL (ref 70–99)
Potassium: 4.4 mmol/L (ref 3.5–5.1)
Sodium: 139 mmol/L (ref 135–145)

## 2018-10-27 LAB — CBC
HCT: 35.1 % — ABNORMAL LOW (ref 36.0–46.0)
Hemoglobin: 11.3 g/dL — ABNORMAL LOW (ref 12.0–15.0)
MCH: 32.6 pg (ref 26.0–34.0)
MCHC: 32.2 g/dL (ref 30.0–36.0)
MCV: 101.2 fL — ABNORMAL HIGH (ref 80.0–100.0)
Platelets: 197 10*3/uL (ref 150–400)
RBC: 3.47 MIL/uL — ABNORMAL LOW (ref 3.87–5.11)
RDW: 14.1 % (ref 11.5–15.5)
WBC: 7.2 10*3/uL (ref 4.0–10.5)
nRBC: 0 % (ref 0.0–0.2)

## 2018-10-27 LAB — PHOSPHORUS: Phosphorus: 2.2 mg/dL — ABNORMAL LOW (ref 2.5–4.6)

## 2018-10-27 LAB — MAGNESIUM: Magnesium: 1.8 mg/dL (ref 1.7–2.4)

## 2018-10-27 MED ORDER — VANCOMYCIN 50 MG/ML ORAL SOLUTION: 125.0000 mg | Freq: Four times a day (QID) | ORAL | 0 refills | Status: DC

## 2018-10-27 NOTE — Discharge Summary (Signed)
[place] [facility] [place] NAME: Yvonne Singleton    MR#:  [id number]  DATE OF BIRTH:  [birth year]  DATE OF ADMISSION:  10/23/2018   ADMITTING PHYSICIAN: Arta Silence, MD  DATE OF DISCHARGE: 10/27/2018 11:27 AM  PRIMARY CARE PHYSICIAN: Perrin Maltese, MD   ADMISSION DIAGNOSIS:  Gastroenteritis [K52.9] Generalized abdominal pain [R10.84] DISCHARGE DIAGNOSIS:  Active Problems:   Regional enteritis of small bowel (HCC)   Clostridium difficile infection  SECONDARY DIAGNOSIS:   Past Medical History:  Diagnosis Date  . Back pain   . Breast cancer ([facility]) 2007   right breast lumpectomy with rad tx  . Cancer [facility]) 2007   right breast  . Collagen vascular disease ([place])   . Gout   . Hard of hearing   . Hypertension   . Parkinson's disease ([place])   . Personal history of radiation therapy 2007   F/U right breast cancer   HOSPITAL COURSE:  A/P: 62F w/ PMHx Parkinson's disease p/w AP/N/V/D. C. Difficile (+). Hyperglycemia, APhos elevation, leukocytosis,  1.  C. difficile diarrhea.  Patient presented with nausea and vomiting with diarrhea.  Patient had significant leukocytosis on presentation with white blood cell count as high as 21,000.  C. difficile difficile antigen positive but C. difficile toxin negative. Based on patient's symptoms , initiated treatment for C. difficile with p.o. vancomycin 125 mg p.o 4 times daily.  Leukocytosis resolved.   Patient however still having persistent diarrhea.  Remains afebrile.  Continue po vancomycin for 7 more days as outpatient. CT scan with evidence of enteritis.  Positive Norovirus GI/GII also. She was on IV fluid hydration.  Diarrhea is improving.  2.  Acute kidney injury Secondary to diarrhea.  Renal function back to normal with IV fluid hydration.  3.  Debility due to multiple medical problems. Patient seen by physical therapist.  No further physical therapy therapy recommended  DISCHARGE  CONDITIONS:  Stable, discharged to home today. CONSULTS OBTAINED:  Treatment Team:  Arta Silence, MD DRUG ALLERGIES:   Allergies  Allergen Reactions  . Trospium Nausea And Vomiting  . Iodinated Diagnostic Agents Hives  . Streptomycin Hives   DISCHARGE MEDICATIONS:   Allergies as of 10/27/2018      Reactions   Trospium Nausea And Vomiting   Iodinated Diagnostic Agents Hives   Streptomycin Hives      Medication List    STOP taking these medications   omeprazole 40 MG capsule Commonly known as:  PRILOSEC     TAKE these medications   allopurinol 100 MG tablet Commonly known as:  ZYLOPRIM Take 200 mg by mouth at bedtime.   b complex vitamins tablet Take 1 tablet by mouth daily.   CALCIUM 600+D 600-200 MG-UNIT Tabs Generic drug:  Calcium Carbonate-Vitamin D Take 1 tablet by mouth daily.   carbidopa-levodopa 50-200 MG tablet Commonly known as:  SINEMET CR Take 1 tablet by mouth at bedtime.   cholecalciferol 1000 units tablet Commonly known as:  VITAMIN D Take 2,000 Units by mouth daily.   DULoxetine 30 MG capsule Commonly known as:  CYMBALTA Take 1 capsule by mouth daily.   furosemide 20 MG tablet Commonly known as:  LASIX Take 30 mg by mouth.   gabapentin 100 MG capsule Commonly known as:  NEURONTIN Limit 1 capsule by mouth per day or every other day if tolerated What changed:    how much to take  how to take this  when to take this  additional instructions   hydroxychloroquine 200 MG tablet Commonly known as:  PLAQUENIL Take 400 mg by mouth at bedtime.   latanoprost 0.005 % ophthalmic solution Commonly known as:  XALATAN Place 1 drop into the right eye at bedtime.   losartan 100 MG tablet Commonly known as:  COZAAR Take 100 mg by mouth daily.   metroNIDAZOLE 0.75 % vaginal gel Commonly known as:  METROGEL metronidazole 0.75 % vaginal gel   oxyCODONE 5 MG immediate release tablet Commonly known as:  Oxy IR/ROXICODONE Take 1  tablet by mouth 3 (three) times daily as needed.   pantoprazole 40 MG tablet Commonly known as:  PROTONIX Take 1 tablet by mouth 2 (two) times daily.   spironolactone 25 MG tablet Commonly known as:  ALDACTONE Take 1 tablet by mouth daily.   tolterodine 4 MG 24 hr capsule Commonly known as:  DETROL LA Take 4 mg by mouth daily.   vancomycin 50 mg/mL  oral solution Commonly known as:  VANCOCIN Take 2.5 mLs (125 mg total) by mouth every 6 (six) hours.        DISCHARGE INSTRUCTIONS:  See AVS. If you experience worsening of your admission symptoms, develop shortness of breath, life threatening emergency, suicidal or homicidal thoughts you must seek medical attention immediately by calling 911 or calling your MD immediately  if symptoms less severe.  You Must read complete instructions/literature along with all the possible adverse reactions/side effects for all the Medicines you take and that have been prescribed to you. Take any new Medicines after you have completely understood and accpet all the possible adverse reactions/side effects.   Please note  You were cared for by a hospitalist during your hospital stay. If you have any questions about your discharge medications or the care you received while you were in the hospital after you are discharged, you can call the unit and asked to speak with the hospitalist on call if the hospitalist that took care of you is not available. Once you are discharged, your primary care physician will handle any further medical issues. Please note that NO REFILLS for any discharge medications will be authorized once you are discharged, as it is imperative that you return to your primary care physician (or establish a relationship with a primary care physician if you do not have one) for your aftercare needs so that they can reassess your need for medications and monitor your lab values.    On the day of Discharge:  VITAL SIGNS:  Blood pressure (!)  138/58, pulse 72, temperature 97.8 F (36.6 C), temperature source Oral, resp. rate 20, height 5' 3.5" (1.613 m), weight 75.8 kg, SpO2 96 %. PHYSICAL EXAMINATION:  GENERAL:  84 y.o.-year-old patient lying in the bed with no acute distress.  EYES: Pupils equal, round, reactive to light and accommodation. No scleral icterus. Extraocular muscles intact.  HEENT: Head atraumatic, normocephalic. Oropharynx and nasopharynx clear.  NECK:  Supple, no jugular venous distention. No thyroid enlargement, no tenderness.  LUNGS: Normal breath sounds bilaterally, no wheezing, rales,rhonchi or crepitation. No use of accessory muscles of respiration.  CARDIOVASCULAR: S1, S2 normal. No murmurs, rubs, or gallops.  ABDOMEN: Soft, non-tender, non-distended. Bowel sounds present. No organomegaly or mass.  EXTREMITIES: No pedal edema, cyanosis, or clubbing.  NEUROLOGIC: Cranial nerves II through XII are intact. Muscle strength 5/5 in all extremities. Sensation intact. Gait not checked.  PSYCHIATRIC: The patient is alert and oriented x 3.  SKIN: No obvious rash, lesion, or ulcer.  DATA  REVIEW:   CBC Recent Labs  Lab 10/27/18 0400  WBC 7.2  HGB 11.3*  HCT 35.1*  PLT 197    Chemistries  Recent Labs  Lab 10/23/18 1736  10/27/18 0400  NA 137   < > 139  K 4.6   < > 4.4  CL 107   < > 116*  CO2 22   < > 19*  GLUCOSE 154*   < > 77  BUN 33*   < > 12  CREATININE 1.01*   < > 0.77  CALCIUM 9.6   < > 8.5*  MG  --    < > 1.8  AST 33  --   --   ALT 12  --   --   ALKPHOS 171*  --   --   BILITOT 0.6  --   --    < > = values in this interval not displayed.     Microbiology Results  Results for orders placed or performed during the hospital encounter of 10/23/18  C difficile quick scan w PCR reflex     Status: Abnormal   Collection Time: 10/24/18  6:20 AM  Result Value Ref Range Status   C Diff antigen POSITIVE (A) NEGATIVE Final   C Diff toxin NEGATIVE NEGATIVE Final   C Diff interpretation Results are  indeterminate. See PCR results.  Final    Comment: Performed at [facility], [address] [place], [place] [address]  Gastrointestinal Panel by PCR , Stool     Status: Abnormal   Collection Time: 10/24/18  6:20 AM  Result Value Ref Range Status   Campylobacter species NOT DETECTED NOT DETECTED Final   Plesimonas shigelloides NOT DETECTED NOT DETECTED Final   Salmonella species NOT DETECTED NOT DETECTED Final   Yersinia enterocolitica NOT DETECTED NOT DETECTED Final   Vibrio species NOT DETECTED NOT DETECTED Final   Vibrio cholerae NOT DETECTED NOT DETECTED Final   Enteroaggregative E coli (EAEC) NOT DETECTED NOT DETECTED Final   Enteropathogenic E coli (EPEC) NOT DETECTED NOT DETECTED Final   Enterotoxigenic E coli (ETEC) NOT DETECTED NOT DETECTED Final   Shiga like toxin producing E coli (STEC) NOT DETECTED NOT DETECTED Final   Shigella/Enteroinvasive E coli (EIEC) NOT DETECTED NOT DETECTED Final   Cryptosporidium NOT DETECTED NOT DETECTED Final   Cyclospora cayetanensis NOT DETECTED NOT DETECTED Final   Entamoeba histolytica NOT DETECTED NOT DETECTED Final   Giardia lamblia NOT DETECTED NOT DETECTED Final   Adenovirus F40/41 NOT DETECTED NOT DETECTED Final   Astrovirus NOT DETECTED NOT DETECTED Final   Norovirus GI/GII DETECTED (A) NOT DETECTED Final    Comment: RESULT CALLED TO, READ BACK BY AND VERIFIED WITH: JENNIFER COLE @0911  ON 10/24/18 BY FMW     Rotavirus A NOT DETECTED NOT DETECTED Final   Sapovirus (I, II, IV, and V) NOT DETECTED NOT DETECTED Final    Comment: Performed at [facility], [address] [place], [place] [address]  C. Diff by PCR, Reflexed     Status: Abnormal   Collection Time: 10/24/18  6:20 AM  Result Value Ref Range Status   Toxigenic C. Difficile by PCR POSITIVE (A) NEGATIVE Final    Comment: Positive for toxigenic C. difficile with little to no toxin production. Only treat if clinical presentation suggests symptomatic  illness. Performed at [facility], [address], [place],  [address]     RADIOLOGY:  No results found.   Management plans discussed with the  patient, her son and husband and they are in agreement.  CODE STATUS: Full Code   TOTAL TIME TAKING CARE OF THIS PATIENT: 33 minutes.    Demetrios Loll M.D on 10/27/2018 at 12:39 PM  Between 7am to 6pm - Pager - [phone]  After 6pm go to [email or web address] - Proofreader  [facility]  Office  [phone]  CC: Primary care physician; Perrin Maltese, MD   Note: This dictation was prepared with Dragon dictation along with smaller phrase technology. Any transcriptional errors that result from this process are unintentional.

## 2018-11-23 ENCOUNTER — Other Ambulatory Visit: Payer: Self-pay | Admitting: Family

## 2018-11-23 DIAGNOSIS — Z1231 Encounter for screening mammogram for malignant neoplasm of breast: Secondary | ICD-10-CM

## 2018-12-05 ENCOUNTER — Other Ambulatory Visit: Payer: Self-pay | Admitting: Family

## 2018-12-05 DIAGNOSIS — R7989 Other specified abnormal findings of blood chemistry: Secondary | ICD-10-CM

## 2018-12-08 ENCOUNTER — Ambulatory Visit: Admission: RE | Admit: 2018-12-08 | Payer: Medicare Other | Source: Ambulatory Visit

## 2018-12-12 ENCOUNTER — Ambulatory Visit
Admission: RE | Admit: 2018-12-12 | Discharge: 2018-12-12 | Disposition: A | Discharge status: Home / Self Care | Payer: Medicare Other | Source: Ambulatory Visit | Attending: Family | Admitting: Family

## 2018-12-12 DIAGNOSIS — R7989 Other specified abnormal findings of blood chemistry: Secondary | ICD-10-CM | POA: Diagnosis present

## 2018-12-20 ENCOUNTER — Other Ambulatory Visit: Payer: Self-pay | Admitting: Gastroenterology

## 2018-12-20 ENCOUNTER — Other Ambulatory Visit (HOSPITAL_COMMUNITY): Payer: Self-pay | Admitting: Gastroenterology

## 2018-12-20 DIAGNOSIS — R935 Abnormal findings on diagnostic imaging of other abdominal regions, including retroperitoneum: Secondary | ICD-10-CM

## 2018-12-27 ENCOUNTER — Ambulatory Visit
Admission: RE | Admit: 2018-12-27 | Discharge: 2018-12-27 | Disposition: A | Discharge status: Home / Self Care | Payer: Medicare Other | Source: Ambulatory Visit | Attending: Gastroenterology | Admitting: Gastroenterology

## 2018-12-27 ENCOUNTER — Other Ambulatory Visit: Payer: Self-pay

## 2018-12-27 DIAGNOSIS — R935 Abnormal findings on diagnostic imaging of other abdominal regions, including retroperitoneum: Secondary | ICD-10-CM | POA: Insufficient documentation

## 2018-12-27 MED ORDER — GADOBUTROL 1 MMOL/ML IV SOLN
7.0000 mL | Freq: Once | INTRAVENOUS | Status: AC | PRN
  Administered 2018-12-27: 7 mL via INTRAVENOUS

## 2019-04-12 ENCOUNTER — Ambulatory Visit
Admission: RE | Admit: 2019-04-12 | Discharge: 2019-04-12 | Disposition: A | Discharge status: Home / Self Care | Payer: Medicare Other | Source: Ambulatory Visit | Attending: Family | Admitting: Family

## 2019-04-12 ENCOUNTER — Other Ambulatory Visit: Payer: Self-pay

## 2019-04-12 DIAGNOSIS — Z1231 Encounter for screening mammogram for malignant neoplasm of breast: Secondary | ICD-10-CM | POA: Insufficient documentation

## 2019-07-22 IMAGING — MG DIGITAL SCREENING BILATERAL MAMMOGRAM WITH TOMO AND CAD
8 of 15 series · 9 of 40 positions shown · non-contrast
Comparison: Previous exam(s).

ACR Breast Density Category a: The breast tissue is almost entirely
fatty.

CLINICAL DATA: Screening.

EXAM:
DIGITAL SCREENING BILATERAL MAMMOGRAM WITH TOMO AND CAD

[L MLO synth-2D (1 of 2)]
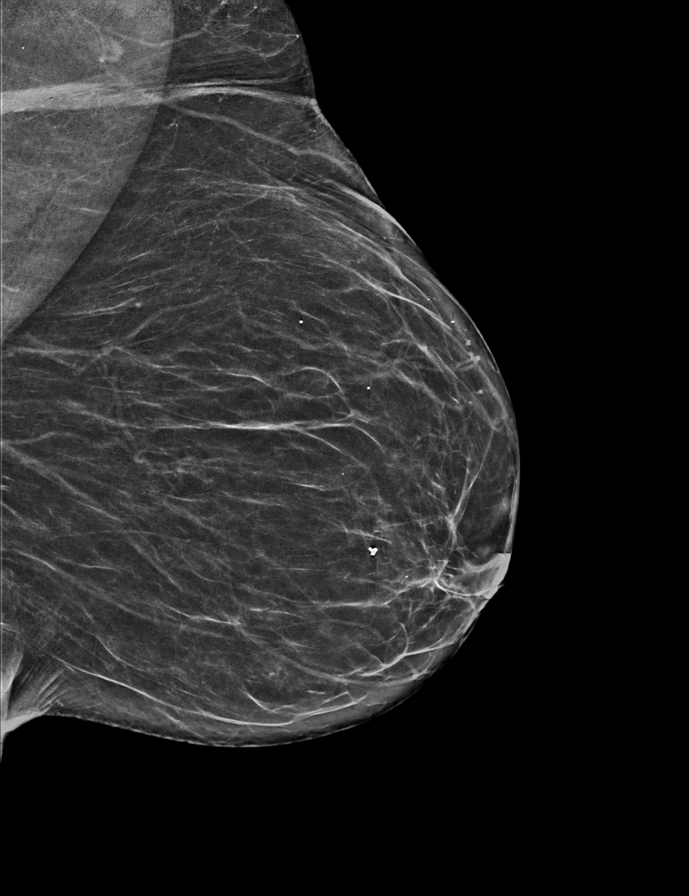

[R MLO synth-2D]
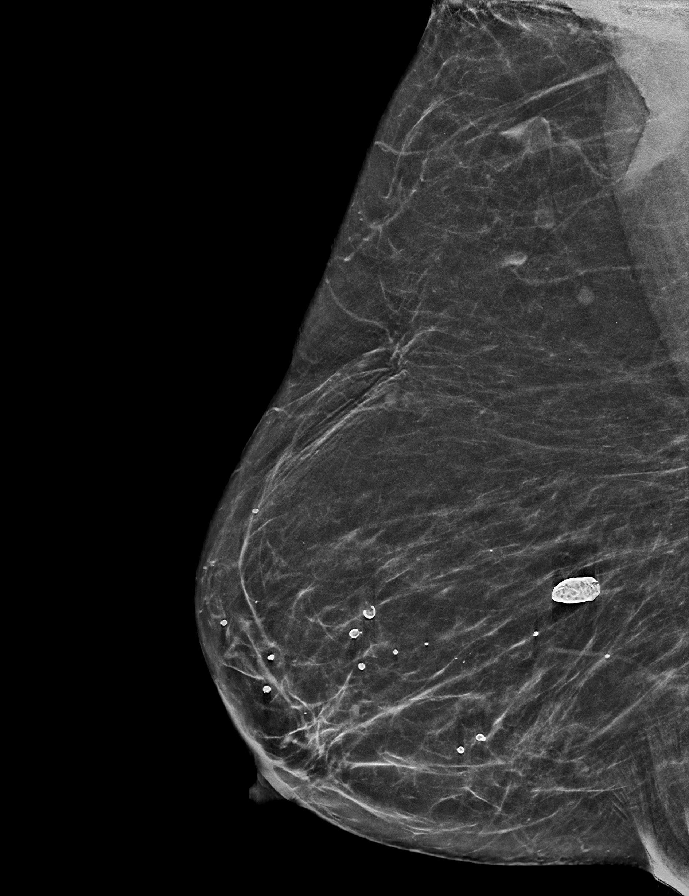

[L MLO synth-2D (2 of 2)]
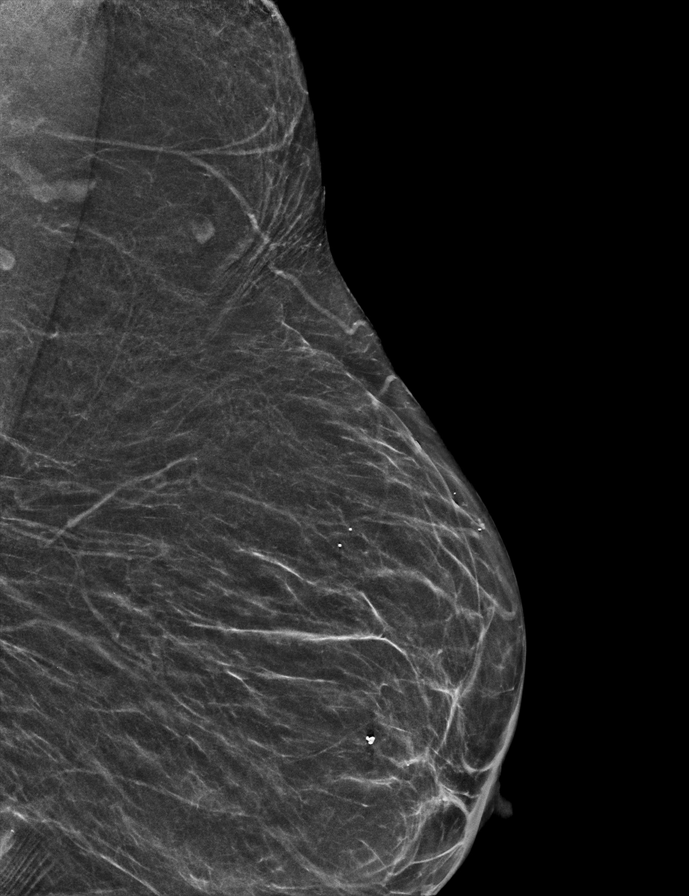

[R CC synth-2D (1 of 2)]
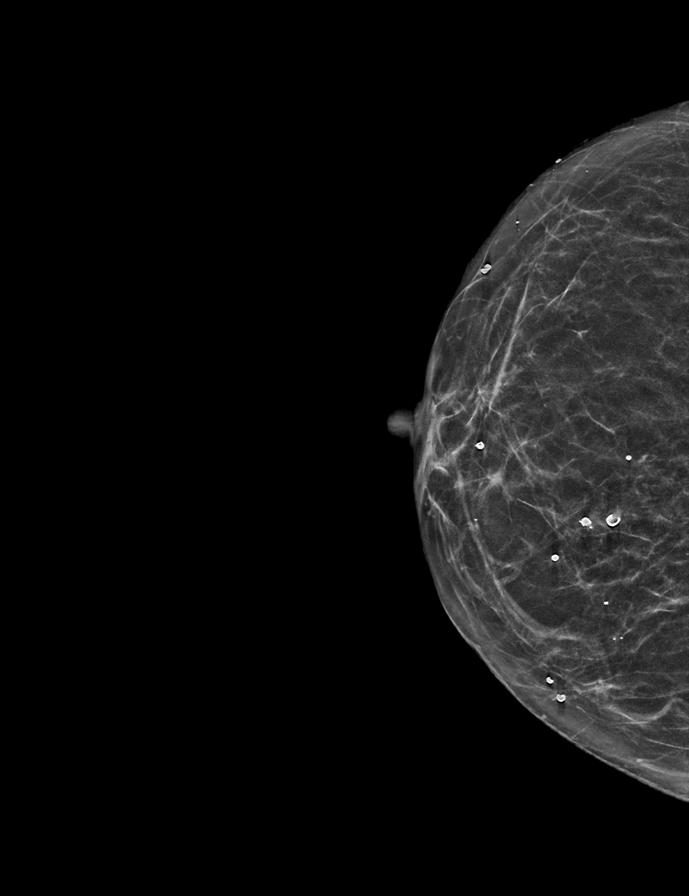

[R CC synth-2D (2 of 2)]
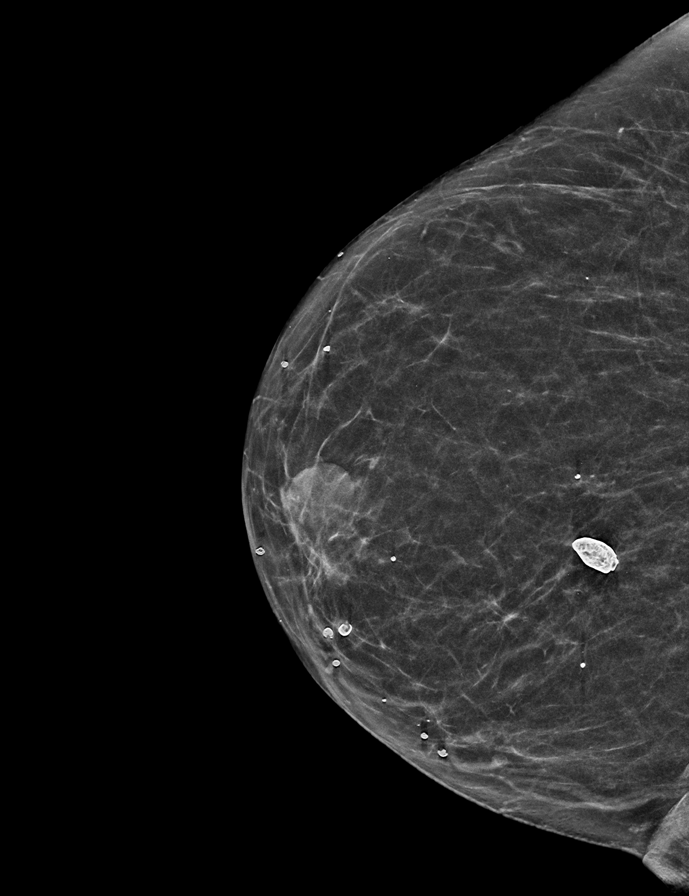

[L CC synth-2D (1 of 2)]
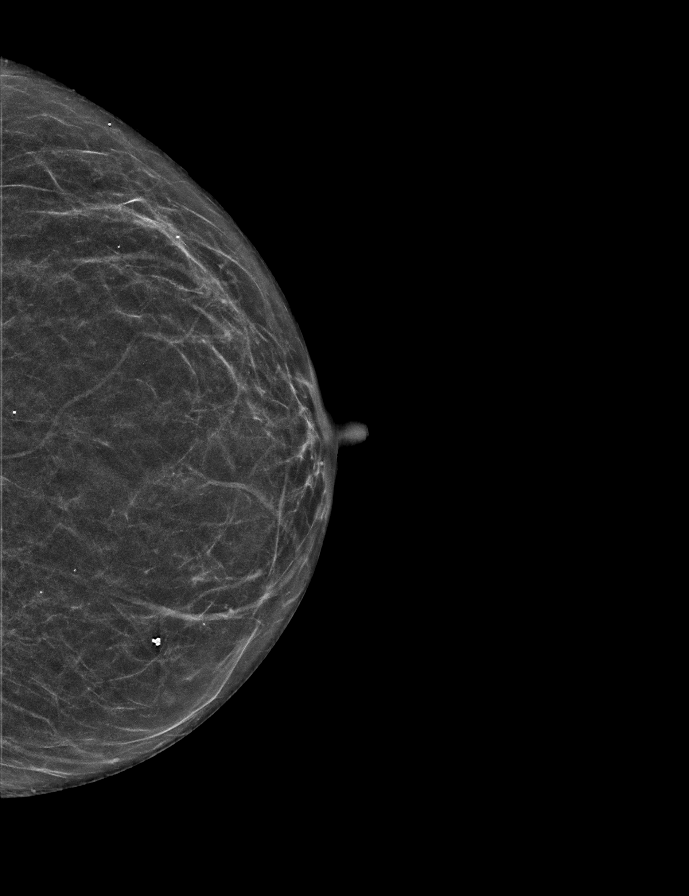

[L CC synth-2D (2 of 2)]
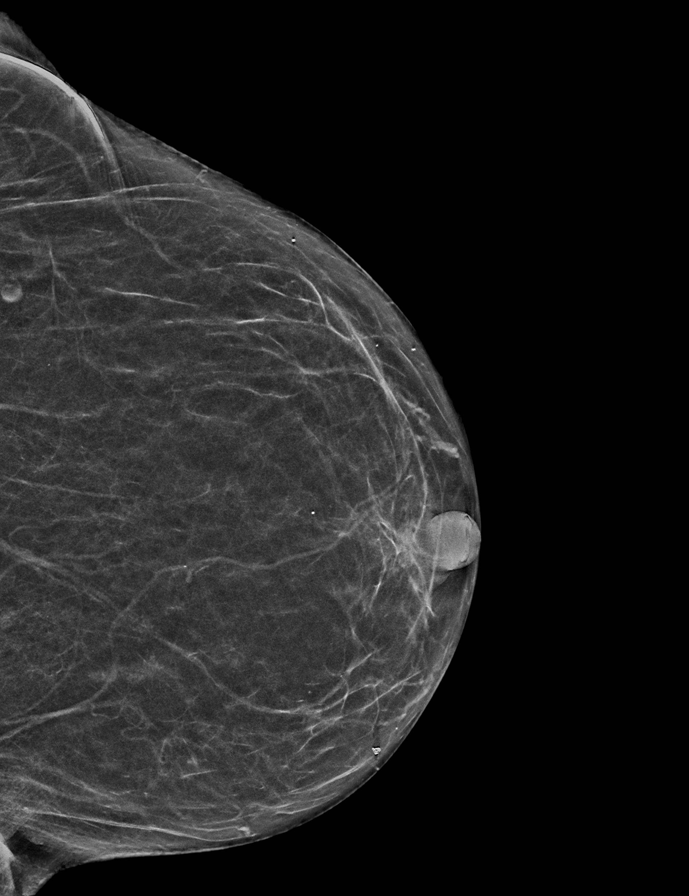

[L MLO tomo · 2 of 57 frames shown]
[frame 29/57]
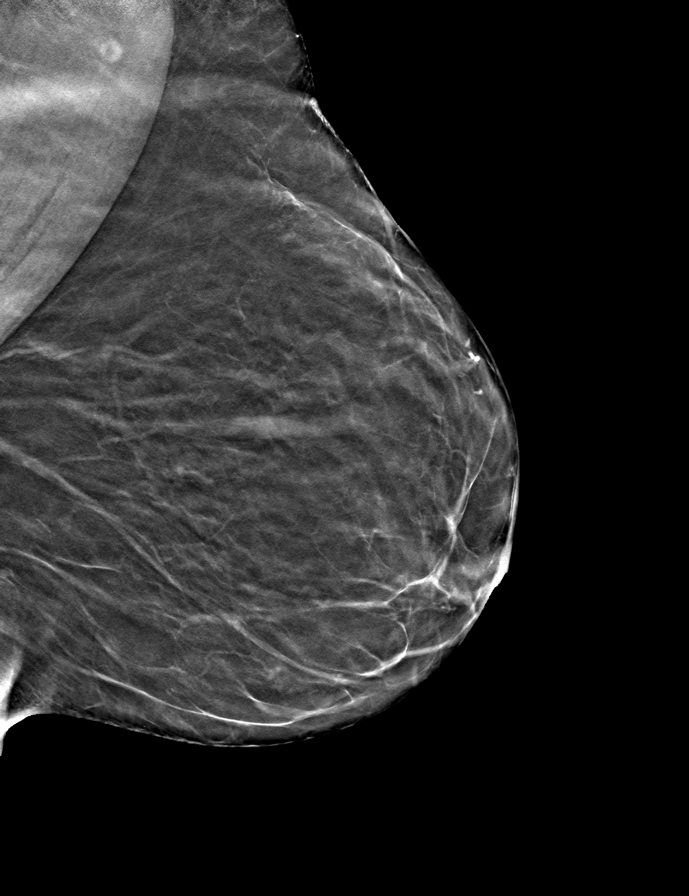
[frame 39/57]
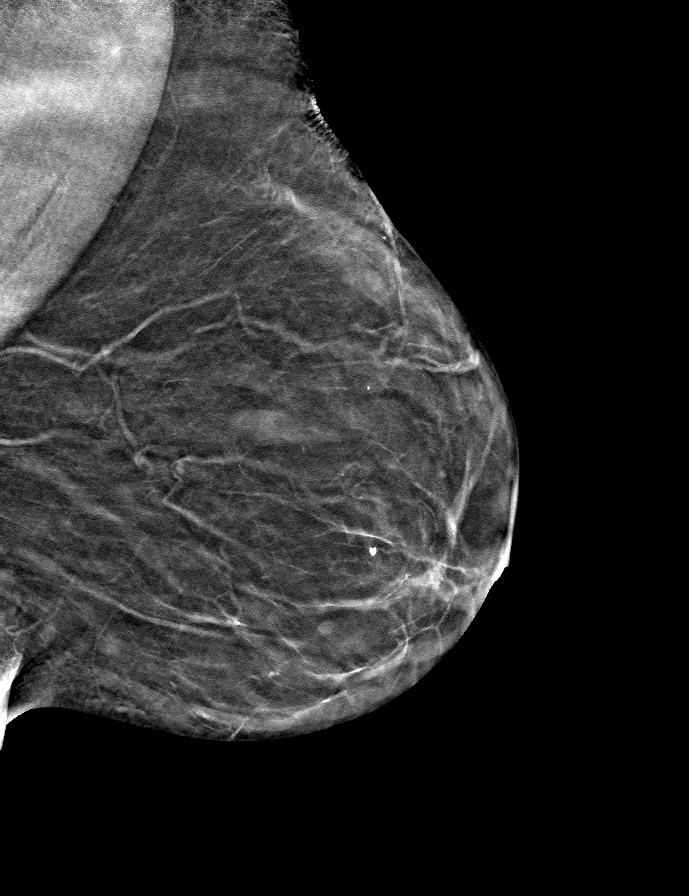

[9 of 40 positions shown; findings below may reference images not displayed]

FINDINGS: There are no findings suspicious for malignancy. Images were
processed with CAD.
IMPRESSION: No mammographic evidence of malignancy. A result letter of this
screening mammogram will be mailed directly to the patient.

RECOMMENDATION:
Screening mammogram in one year. (Code:[id number])

BI-RADS CATEGORY  1: Negative.

## 2020-01-01 ENCOUNTER — Other Ambulatory Visit: Payer: Self-pay | Admitting: Internal Medicine

## 2020-01-01 DIAGNOSIS — Z1231 Encounter for screening mammogram for malignant neoplasm of breast: Secondary | ICD-10-CM

## 2020-02-11 ENCOUNTER — Emergency Department: Payer: Medicare Other

## 2020-02-11 ENCOUNTER — Emergency Department
Admission: EM | Admit: 2020-02-11 | Discharge: 2020-02-11 | Disposition: A | Discharge status: Home / Self Care | Payer: Medicare Other | Attending: Emergency Medicine | Admitting: Emergency Medicine

## 2020-02-11 ENCOUNTER — Other Ambulatory Visit: Payer: Self-pay

## 2020-02-11 DIAGNOSIS — Z853 Personal history of malignant neoplasm of breast: Secondary | ICD-10-CM | POA: Insufficient documentation

## 2020-02-11 DIAGNOSIS — G20 Parkinson's disease: Secondary | ICD-10-CM | POA: Insufficient documentation

## 2020-02-11 DIAGNOSIS — Z79899 Other long term (current) drug therapy: Secondary | ICD-10-CM | POA: Insufficient documentation

## 2020-02-11 DIAGNOSIS — I498 Other specified cardiac arrhythmias: Secondary | ICD-10-CM

## 2020-02-11 DIAGNOSIS — I10 Essential (primary) hypertension: Secondary | ICD-10-CM | POA: Diagnosis not present

## 2020-02-11 DIAGNOSIS — R001 Bradycardia, unspecified: Secondary | ICD-10-CM | POA: Diagnosis present

## 2020-02-11 LAB — BASIC METABOLIC PANEL
Anion gap: 6 (ref 5–15)
BUN: 38 mg/dL — ABNORMAL HIGH (ref 8–23)
CO2: 27 mmol/L (ref 22–32)
Calcium: 9.5 mg/dL (ref 8.9–10.3)
Chloride: 105 mmol/L (ref 98–111)
Creatinine, Ser: 1.16 mg/dL — ABNORMAL HIGH (ref 0.44–1.00)
GFR calc Af Amer: 51 mL/min — ABNORMAL LOW (ref >60)
GFR calc non Af Amer: 44 mL/min — ABNORMAL LOW (ref >60)
Glucose, Bld: 84 mg/dL (ref 70–99)
Potassium: 5.1 mmol/L (ref 3.5–5.1)
Sodium: 138 mmol/L (ref 135–145)

## 2020-02-11 LAB — CBC
HCT: 41 % (ref 36.0–46.0)
Hemoglobin: 13.4 g/dL (ref 12.0–15.0)
MCH: 32.3 pg (ref 26.0–34.0)
MCHC: 32.7 g/dL (ref 30.0–36.0)
MCV: 98.8 fL (ref 80.0–100.0)
Platelets: 225 10*3/uL (ref 150–400)
RBC: 4.15 MIL/uL (ref 3.87–5.11)
RDW: 13.2 % (ref 11.5–15.5)
WBC: 7.6 10*3/uL (ref 4.0–10.5)
nRBC: 0 % (ref 0.0–0.2)

## 2020-02-11 LAB — TROPONIN I (HIGH SENSITIVITY): Troponin I (High Sensitivity): 6 ng/L (ref <18)

## 2020-02-11 NOTE — Discharge Instructions (Signed)
Your EKG shows a new irregular rhythm called atrial bigeminy.  This is causing her heart rates to be slightly low.  Given you have no symptoms we feel comfortable with going home but you should return to the ER if you develop any lightheadedness, passing out, dizziness or any other concerns.  Your kidney function was also slightly elevated you should continue to drink plenty of fluids.  Return to the ER for any other concerns  Cardiology should make an appointment with you.  Do not hear from them you should call them to schedule an appointment

## 2020-02-11 NOTE — ED Notes (Signed)
Signature pad in room not working. Patient verbalized understanding of discharge instructions and follow-up care.  

## 2020-02-11 NOTE — ED Triage Notes (Signed)
First Nurse Note:  Patient came to ED from [facility].  Patient was being seen for routine pain medication follow-up appointment, but had a heart rate in the 30s for 10 minutes.  HR is now in the 70s.  Patient was not hypotensive or symptomatic.  Patient does not have any history.

## 2020-02-11 NOTE — ED Triage Notes (Signed)
Pt arrives to ER from [facility] for HR in 30's pt states while coming to ER HR went back up to 70's. Denies cardiac hx. Denies dizziness, weakness. [facility] did NOT do an EKG to confirm. A&O.

## 2020-02-11 NOTE — ED Provider Notes (Addendum)
[facility] Emergency Department Provider Note  ____________________________________________   First MD Initiated Contact with Patient 02/11/20 1232     (approximate)  I have reviewed the triage vital signs and the nursing notes.   HISTORY  Chief Complaint Bradycardia    HPI Yvonne Singleton is a 85 y.o. female with Parkinson's, hypertension who comes in with low heart rate.  Patient was being seen at [facility] for routine pain medication follow-up appointment but they noted that her heart rate was in the 30s for 10 minutes.  Patient denied any symptoms and patient was not hypotensive.  Patient states that she is never had any issues with her heart rates before.  Never had any heart issues.  Denies any chest pain or shortness of breath.  Patient denies any loss of consciousness.  States she feels her normal self.  She states that her husband did die recently so she has been sad about that but denies any significant chest pain or any other concerns.          Past Medical History:  Diagnosis Date  . Back pain   . Breast cancer ([place]) 2007   right breast lumpectomy with rad tx  . Cancer [facility]) 2007   right breast  . Collagen vascular disease ([place])   . Gout   . Hard of hearing   . Hypertension   . Parkinson's disease ([place])   . Personal history of radiation therapy 2007   F/U right breast cancer    Patient Active Problem List   Diagnosis Date Noted  . Regional enteritis of small bowel ([place]) 10/24/2018  . Clostridium difficile infection 10/24/2018  . Lower extremity edema 06/27/2018  . Hyperlipidemia 05/30/2017  . Hypertension 05/30/2017  . GERD (gastroesophageal reflux disease) 05/30/2017  . Parkinson disease ([place]) 10/29/2016  . History of bilateral hip replacements 05/28/2015  . Spinal stenosis, lumbar region, with neurogenic claudication 05/28/2015  . DDD (degenerative disc disease), lumbar 03/13/2015  . Lumbar post-laminectomy syndrome  03/13/2015  . Degenerative cervical disc 03/13/2015  . Status post bilateral total hip replacement 03/13/2015  . Sacroiliac joint dysfunction 03/13/2015  . History of surgery on upper extremity 03/13/2015  . DJD of shoulder 03/13/2015  . Rheumatoid arthritis ([place]) 02/17/2015    Past Surgical History:  Procedure Laterality Date  . ABDOMINAL HYSTERECTOMY    . BREAST LUMPECTOMY Right 2007   suspicious calcs, f/u with radiation  . CATARACT EXTRACTION Bilateral   . ESOPHAGOGASTRODUODENOSCOPY (EGD) WITH PROPOFOL N/A 05/25/2017   Procedure: ESOPHAGOGASTRODUODENOSCOPY (EGD) WITH PROPOFOL;  Surgeon: Lollie Sails, MD;  Location: [facility] ENDOSCOPY;  Service: Endoscopy;  Laterality: N/A;  . ESOPHAGOGASTRODUODENOSCOPY (EGD) WITH PROPOFOL N/A 05/09/2018   Procedure: ESOPHAGOGASTRODUODENOSCOPY (EGD) WITH PROPOFOL;  Surgeon: Lin Landsman, MD;  Location: [facility] ENDOSCOPY;  Service: Gastroenterology;  Laterality: N/A;  . EYE SURGERY Bilateral   . HAND SURGERY    . HIP CLOSED REDUCTION Left 06/24/2015   Procedure: CLOSED REDUCTION HIP;  Surgeon: Dereck Leep, MD;  Location: [facility] ORS;  Service: Orthopedics;  Laterality: Left;  . HIP CLOSED REDUCTION Left 02/09/2016   Procedure: CLOSED MANIPULATION HIP;  Surgeon: Earnestine Leys, MD;  Location: [facility] ORS;  Service: Orthopedics;  Laterality: Left;  . HIP SURGERY    . JOINT REPLACEMENT     bilateral hip  . OOPHORECTOMY    . TOTAL KNEE ARTHROPLASTY Bilateral     Prior to Admission medications   Medication Sig Start Date End Date Taking? Authorizing Provider  allopurinol (ZYLOPRIM) 100 MG tablet Take 200 mg by mouth at bedtime.     [provider]  b complex vitamins tablet Take 1 tablet by mouth daily.    [provider]  Calcium Carbonate-Vitamin D (CALCIUM 600+D) 600-200 MG-UNIT TABS Take 1 tablet by mouth daily.    [provider]  carbidopa-levodopa (SINEMET CR) 50-200 MG per tablet Take 1 tablet by mouth at bedtime.      [provider]  cholecalciferol (VITAMIN D) 1000 units tablet Take 2,000 Units by mouth daily.    [provider]  DULoxetine (CYMBALTA) 30 MG capsule Take 1 capsule by mouth daily. 08/05/18   [provider]  furosemide (LASIX) 20 MG tablet Take 30 mg by mouth.    [provider]  gabapentin (NEURONTIN) 100 MG capsule Limit 1 capsule by mouth per day or every other day if tolerated Patient taking differently: Take 100 mg by mouth 2 (two) times daily.  06/17/16   Mohammed Kindle, MD  hydroxychloroquine (PLAQUENIL) 200 MG tablet Take 400 mg by mouth at bedtime.     [provider]  latanoprost (XALATAN) 0.005 % ophthalmic solution Place 1 drop into the right eye at bedtime.  05/26/18   [provider]  losartan (COZAAR) 100 MG tablet Take 100 mg by mouth daily.     [provider]  metroNIDAZOLE (METROGEL) 0.75 % vaginal gel metronidazole 0.75 % vaginal gel    [provider]  oxyCODONE (OXY IR/ROXICODONE) 5 MG immediate release tablet Take 1 tablet by mouth 3 (three) times daily as needed. 07/28/18   [provider]  pantoprazole (PROTONIX) 40 MG tablet Take 1 tablet by mouth 2 (two) times daily. 10/16/18   [provider]  spironolactone (ALDACTONE) 25 MG tablet Take 1 tablet by mouth daily. 07/24/18   [provider]  tolterodine (DETROL LA) 4 MG 24 hr capsule Take 4 mg by mouth daily.    [provider]  vancomycin (VANCOCIN) 50 mg/mL oral solution Take 2.5 mLs (125 mg total) by mouth every 6 (six) hours. 10/27/18   Demetrios Loll, MD    Allergies Trospium, Iodinated diagnostic agents, and Streptomycin  Family History  Problem Relation Age of Onset  . Heart disease Mother   . Arthritis Mother   . Heart disease Father   . Ulcers Father   . Breast cancer Maternal Aunt     Social History Social History   Tobacco Use  . Smoking status: Never Smoker  . Smokeless tobacco: Never Used    Substance Use Topics  . Alcohol use: No    Alcohol/week: 0.0 standard drinks  . Drug use: No      Review of Systems Constitutional: No fever/chills Eyes: No visual changes. ENT: No sore throat. Cardiovascular: Denies chest pain.  Low heart rate Low HR  Respiratory: Denies shortness of breath. Gastrointestinal: No abdominal pain.  No nausea, no vomiting.  No diarrhea.  No constipation. Genitourinary: Negative for dysuria. Musculoskeletal: Negative for back pain. Skin: Negative for rash. Neurological: Negative for headaches, focal weakness or numbness. All other ROS negative ____________________________________________   PHYSICAL EXAM:  VITAL SIGNS: ED Triage Vitals  Enc Vitals Group     BP 02/11/20 1150 131/71     Pulse Rate 02/11/20 1150 (!) 36     Resp 02/11/20 1150 16     Temp 02/11/20 1150 98.5 F (36.9 C)     Temp src --      SpO2 02/11/20 1150 99 %  Weight 02/11/20 1157 176 lb (79.8 kg)     Height 02/11/20 1157 5' 3"  (1.6 m)     Head Circumference --      Peak Flow --      Pain Score 02/11/20 1156 0     Pain Loc --      Pain Edu? --      Excl. in [place]? --     Constitutional: Alert and oriented. Well appearing and in no acute distress. Eyes: Conjunctivae are normal. EOMI. Head: Atraumatic. Nose: No congestion/rhinnorhea. Mouth/Throat: Mucous membranes are moist.   Neck: No stridor. Trachea Midline. FROM Cardiovascular: brady irregular. Grossly normal heart sounds.  Good peripheral circulation. Respiratory: Normal respiratory effort.  No retractions. Lungs CTAB. Gastrointestinal: Soft and nontender. No distention. No abdominal bruits.  Musculoskeletal: No lower extremity tenderness nor edema.  No joint effusions. Neurologic:  Normal speech and language. No gross focal neurologic deficits are appreciated.  Skin:  Skin is warm, dry and intact. No rash noted. Psychiatric: Mood and affect are normal. Speech and behavior are normal. GU: Deferred    ____________________________________________   LABS (all labs ordered are listed, but only abnormal results are displayed)  Labs Reviewed  BASIC METABOLIC PANEL - Abnormal; Notable for the following components:      Result Value   BUN 38 (*)    Creatinine, Ser 1.16 (*)    GFR calc non Af Amer 44 (*)    GFR calc Af Amer 51 (*)    All other components within normal limits  CBC  TROPONIN I (HIGH SENSITIVITY)  TROPONIN I (HIGH SENSITIVITY)   ____________________________________________   ED ECG REPORT I, Vanessa Portage, the attending physician, personally viewed and interpreted this ECG.  Patient has EKG with bigeminy, sinus, rate of 68, no ST elevation no T wave inversion, normal intervals  EKG with bigeminy, sinus, rate 79, no st elevation, no twi, normal intervals.  ____________________________________________  RADIOLOGY Robert Bellow, personally viewed and evaluated these images (plain radiographs) as part of my medical decision making, as well as reviewing the written report by the radiologist.  ED MD interpretation: No pneumonia, large hiatal hernia  Official radiology report(s): DG Chest 2 View  Result Date: 02/11/2020 CLINICAL DATA:  85 year old female with bradycardia. EXAM: CHEST - 2 VIEW COMPARISON:  Chest CT 09/19/2018 and earlier. FINDINGS: AP and lateral views of the chest. Large hiatal hernia persists, more gas-filled now as compared to the 2019 CT. Other mediastinal contours remain within normal limits. Visualized tracheal air column is within normal limits. Lung volumes are within normal limits. No pneumothorax, pulmonary edema, pleural effusion or confluent pulmonary opacity. No acute osseous abnormality identified. Negative visible bowel gas pattern. IMPRESSION: Chronic large hiatal hernia as seen by CT in 2019. No acute cardiopulmonary abnormality. Electronically Signed   By: Genevie Ann M.D.   On: 02/11/2020 12:49     ____________________________________________   PROCEDURES  Procedure(s) performed (including Critical Care):  Procedures   ____________________________________________   INITIAL IMPRESSION / ASSESSMENT AND PLAN / ED COURSE  Derionna Salvador was evaluated in Emergency Department on 02/11/2020 for the symptoms described in the history of present illness. She was evaluated in the context of the global COVID-19 pandemic, which necessitated consideration that the patient might be at risk for infection with the SARS-CoV-2 virus that causes COVID-19. Institutional protocols and algorithms that pertain to the evaluation of patients at risk for COVID-19 are in a state of rapid change based on information released  by regulatory bodies including the CDC and federal and state organizations. These policies and algorithms were followed during the patient's care in the ED.    Patient is a 85 year old who is asymptomatic comes in with EKG showing bigeminy.  Patient occasionally is perfusing her PACs.  Her heart rates ranged from the 40s to 50s upon palpation but she remains asymptomatic.  Will get labs evaluate electrolyte abnormalities, AKI.  Patient has no evidence of myxedema coma.  She denies any chest pain to suggest ACS.  Denies any shortness of breath.  Labs are reassuring.  Kidney function slightly elevated at 1.16 but patient not hypotensive.  We encourage p.o. hydration.  No evidence of anemia.  Cardiac marker is 6 and patient has no chest pain.  On review of patient's medication she is not on any beta-blocker  Discussed with Dr. Fletcher Anon from cardiology.  Given patient is asymptomatic he feels comfortable with patient following up outpatient in clinic.  Patient understands that if she has symptom onset that she should return to the ER Patient also would prefer to go home at this time  I discussed the provisional nature of ED diagnosis, the treatment so far, the ongoing plan of care,  follow up appointments and return precautions with the patient and any family or support people present. They expressed understanding and agreed with the plan, discharged home.         ____________________________________________   FINAL CLINICAL IMPRESSION(S) / ED DIAGNOSES   Final diagnoses:  Atrial bigeminy      MEDICATIONS GIVEN DURING THIS VISIT:  Medications - No data to display   ED Discharge Orders    None       Note:  This document was prepared using Dragon voice recognition software and may include unintentional dictation errors.   Vanessa [place], MD 02/11/20 1445    Vanessa [place], MD 02/28/20 1026

## 2020-04-14 ENCOUNTER — Ambulatory Visit
Admission: RE | Admit: 2020-04-14 | Discharge: 2020-04-14 | Disposition: A | Discharge status: Home / Self Care | Payer: Medicare Other | Source: Ambulatory Visit | Attending: Internal Medicine | Admitting: Internal Medicine

## 2020-04-14 DIAGNOSIS — Z1231 Encounter for screening mammogram for malignant neoplasm of breast: Secondary | ICD-10-CM | POA: Insufficient documentation

## 2020-11-18 ENCOUNTER — Other Ambulatory Visit: Payer: Self-pay

## 2020-11-18 ENCOUNTER — Emergency Department: Payer: Medicare Other

## 2020-11-18 ENCOUNTER — Emergency Department
Admission: EM | Admit: 2020-11-18 | Discharge: 2020-11-18 | Disposition: A | Discharge status: Home / Self Care | Payer: Medicare Other | Attending: Student in an Organized Health Care Education/Training Program | Admitting: Student in an Organized Health Care Education/Training Program

## 2020-11-18 DIAGNOSIS — Z96643 Presence of artificial hip joint, bilateral: Secondary | ICD-10-CM | POA: Insufficient documentation

## 2020-11-18 DIAGNOSIS — X58XXXA Exposure to other specified factors, initial encounter: Secondary | ICD-10-CM | POA: Diagnosis not present

## 2020-11-18 DIAGNOSIS — G20 Parkinson's disease: Secondary | ICD-10-CM | POA: Insufficient documentation

## 2020-11-18 DIAGNOSIS — I10 Essential (primary) hypertension: Secondary | ICD-10-CM | POA: Insufficient documentation

## 2020-11-18 DIAGNOSIS — Z20822 Contact with and (suspected) exposure to covid-19: Secondary | ICD-10-CM | POA: Diagnosis not present

## 2020-11-18 DIAGNOSIS — K668 Other specified disorders of peritoneum: Secondary | ICD-10-CM

## 2020-11-18 DIAGNOSIS — Z853 Personal history of malignant neoplasm of breast: Secondary | ICD-10-CM | POA: Insufficient documentation

## 2020-11-18 DIAGNOSIS — Z79899 Other long term (current) drug therapy: Secondary | ICD-10-CM | POA: Insufficient documentation

## 2020-11-18 DIAGNOSIS — T18128A Food in esophagus causing other injury, initial encounter: Secondary | ICD-10-CM | POA: Diagnosis present

## 2020-11-18 DIAGNOSIS — R1310 Dysphagia, unspecified: Secondary | ICD-10-CM

## 2020-11-18 DIAGNOSIS — R110 Nausea: Secondary | ICD-10-CM | POA: Insufficient documentation

## 2020-11-18 LAB — CBC WITH DIFFERENTIAL/PLATELET
Abs Immature Granulocytes: 0.03 10*3/uL (ref 0.00–0.07)
Basophils Absolute: 0 10*3/uL (ref 0.0–0.1)
Basophils Relative: 0 %
Eosinophils Absolute: 0.2 10*3/uL (ref 0.0–0.5)
Eosinophils Relative: 2 %
HCT: 37.9 % (ref 36.0–46.0)
Hemoglobin: 12.3 g/dL (ref 12.0–15.0)
Immature Granulocytes: 0 %
Lymphocytes Relative: 12 %
Lymphs Abs: 1 10*3/uL (ref 0.7–4.0)
MCH: 33.1 pg (ref 26.0–34.0)
MCHC: 32.5 g/dL (ref 30.0–36.0)
MCV: 101.9 fL — ABNORMAL HIGH (ref 80.0–100.0)
Monocytes Absolute: 0.4 10*3/uL (ref 0.1–1.0)
Monocytes Relative: 5 %
Neutro Abs: 6.2 10*3/uL (ref 1.7–7.7)
Neutrophils Relative %: 81 %
Platelets: 215 10*3/uL (ref 150–400)
RBC: 3.72 MIL/uL — ABNORMAL LOW (ref 3.87–5.11)
RDW: 13.8 % (ref 11.5–15.5)
WBC: 7.8 10*3/uL (ref 4.0–10.5)
nRBC: 0 % (ref 0.0–0.2)

## 2020-11-18 LAB — COMPREHENSIVE METABOLIC PANEL
ALT: 17 U/L (ref 0–44)
AST: 24 U/L (ref 15–41)
Albumin: 3.9 g/dL (ref 3.5–5.0)
Alkaline Phosphatase: 70 U/L (ref 38–126)
Anion gap: 9 (ref 5–15)
BUN: 42 mg/dL — ABNORMAL HIGH (ref 8–23)
CO2: 24 mmol/L (ref 22–32)
Calcium: 9.4 mg/dL (ref 8.9–10.3)
Chloride: 107 mmol/L (ref 98–111)
Creatinine, Ser: 1.13 mg/dL — ABNORMAL HIGH (ref 0.44–1.00)
GFR, Estimated: 48 mL/min — ABNORMAL LOW (ref >60)
Glucose, Bld: 155 mg/dL — ABNORMAL HIGH (ref 70–99)
Potassium: 4.4 mmol/L (ref 3.5–5.1)
Sodium: 140 mmol/L (ref 135–145)
Total Bilirubin: 0.7 mg/dL (ref 0.3–1.2)
Total Protein: 7 g/dL (ref 6.5–8.1)

## 2020-11-18 LAB — TROPONIN I (HIGH SENSITIVITY): Troponin I (High Sensitivity): 7 ng/L (ref <18)

## 2020-11-18 LAB — SARS CORONAVIRUS 2 BY RT PCR (HOSPITAL ORDER, PERFORMED IN ~~LOC~~ HOSPITAL LAB): SARS Coronavirus 2: NEGATIVE

## 2020-11-18 MED ORDER — PANTOPRAZOLE SODIUM 40 MG PO TBEC: 40.0000 mg | DELAYED_RELEASE_TABLET | Freq: Two times a day (BID) | ORAL | 1 refills | Status: DC

## 2020-11-18 MED ORDER — ONDANSETRON HCL 4 MG/2ML IJ SOLN
4.0000 mg | Freq: Once | INTRAMUSCULAR | Status: AC
  Administered 2020-11-18: 4 mg via INTRAVENOUS
  Filled 2020-11-18: qty 2

## 2020-11-18 MED ORDER — GLUCAGON HCL (RDNA) 1 MG IJ SOLR
1.0000 mg | Freq: Once | INTRAMUSCULAR | Status: AC
  Administered 2020-11-18: 1 mg via INTRAVENOUS
  Filled 2020-11-18 (×2): qty 1

## 2020-11-18 NOTE — ED Notes (Signed)
Pt sipping water. EDP at bedside.

## 2020-11-18 NOTE — ED Notes (Signed)
Helped pt ambulate to room toilet. Pt voided on toilet, preparing to discharge.

## 2020-11-18 NOTE — ED Provider Notes (Signed)
3:02 PM Assumed care for off going team.   Blood pressure 124/85, pulse 62, temperature 98.6 F (37 C), temperature source Oral, resp. rate 19, height 5' 3"  (1.6 m), weight 81.6 kg, SpO2 97 %.  See their HPI for full report but in brief  Possible food impaction that now passed-->Tolerating Ginger ail- EKG, labs .  If workup negative okay to dc home.   Labs are reassuring, no white count to suggest infection.  Kidney function slightly elevated at 1.13 but this is around her baseline from 9 months ago.  Cardiac marker was negative.  Covid test was negative.  Patient's chest x-ray was concerning for a large hiatal hernia which could be contributing to patient's symptoms.  We will give her GI referral.  There is also concern for some possible atelectasis versus developing infection and to correlate for signs of volume overload.  Thus possibility of some free air although the radiologist thought that it could just be projection and so wanted decubitus x-ray.  We added this on but it was negative for free air which I have very low suspicion for given her abdominal exam  Discussed patient's x-ray with her.  She has no symptoms of pneumonia.  She does have some edema in her legs right greater than left.  Patient states that she is always has right greater than left leg swelling for many years now.  She is had surgeries on both of her legs.  I suspect this is most likely related to her prior surgeries but she is never had DVT ultrasound done.  We discussed getting a DVT ultrasound today but she states that she would rather go home and follow-up with her primary care doctor given this is chronic in nature I think it is less likely that it is a DVT but she will follow up outpatient to potentially get ultrasound.  She does have some swelling her legs that make me think that her chest x-ray might show a little bit of fluid and I asked her to increase her Lasix to twice a day for the next 3 days.  She return to the ER if  develop worsening shortness of breath.  Patient also stated that she already was evaluated by GI and [facility] and they did not find that she was a surgical candidate.  Her abdomen is soft and and nontender.  She denies any shortness of breath.  This time I feel like she is safe for discharge home.  I discussed the provisional nature of ED diagnosis, the treatment so far, the ongoing plan of care, follow up appointments and return precautions with the patient and any family or support people present. They expressed understanding and agreed with the plan, discharged home.       Vanessa [place], MD 11/18/20 [phone]

## 2020-11-18 NOTE — ED Provider Notes (Addendum)
[facility] Emergency Department Provider Note    Event Date/Time   First MD Initiated Contact with Patient 11/18/20 1335     (approximate)  I have reviewed the triage vital signs and the nursing notes.   HISTORY  Chief Complaint food bolus    HPI Yvonne Singleton is a 86 y.o. female who presents to the ER for evaluation of feel like she is got some food stuck in her throat.  States last night she was eating [employer] and felt like something got caught.  Since then has been having cramping discomfort.  Did have some nausea and was having of vomit spit this morning.  On arrival to the ER she feels like her symptoms have gotten much better.  Denies any diaphoresis.  No shortness of breath.    Past Medical History:  Diagnosis Date  . Back pain   . Breast cancer ([place]) 2007   right breast lumpectomy with rad tx  . Cancer [facility]) 2007   right breast  . Collagen vascular disease ([place])   . Gout   . Hard of hearing   . Hypertension   . Parkinson's disease ([place])   . Personal history of radiation therapy 2007   F/U right breast cancer   Family History  Problem Relation Age of Onset  . Heart disease Mother   . Arthritis Mother   . Heart disease Father   . Ulcers Father   . Breast cancer Maternal Aunt    Past Surgical History:  Procedure Laterality Date  . ABDOMINAL HYSTERECTOMY    . BREAST LUMPECTOMY Right 2007   suspicious calcs, f/u with radiation  . CATARACT EXTRACTION Bilateral   . ESOPHAGOGASTRODUODENOSCOPY (EGD) WITH PROPOFOL N/A 05/25/2017   Procedure: ESOPHAGOGASTRODUODENOSCOPY (EGD) WITH PROPOFOL;  Surgeon: Lollie Sails, MD;  Location: [facility] ENDOSCOPY;  Service: Endoscopy;  Laterality: N/A;  . ESOPHAGOGASTRODUODENOSCOPY (EGD) WITH PROPOFOL N/A 05/09/2018   Procedure: ESOPHAGOGASTRODUODENOSCOPY (EGD) WITH PROPOFOL;  Surgeon: Lin Landsman, MD;  Location: [facility] ENDOSCOPY;  Service: Gastroenterology;  Laterality: N/A;  . EYE  SURGERY Bilateral   . HAND SURGERY    . HIP CLOSED REDUCTION Left 06/24/2015   Procedure: CLOSED REDUCTION HIP;  Surgeon: Dereck Leep, MD;  Location: [facility] ORS;  Service: Orthopedics;  Laterality: Left;  . HIP CLOSED REDUCTION Left 02/09/2016   Procedure: CLOSED MANIPULATION HIP;  Surgeon: Earnestine Leys, MD;  Location: [facility] ORS;  Service: Orthopedics;  Laterality: Left;  . HIP SURGERY    . JOINT REPLACEMENT     bilateral hip  . OOPHORECTOMY    . TOTAL KNEE ARTHROPLASTY Bilateral    Patient Active Problem List   Diagnosis Date Noted  . Regional enteritis of small bowel ([place]) 10/24/2018  . Clostridium difficile infection 10/24/2018  . Lower extremity edema 06/27/2018  . Hyperlipidemia 05/30/2017  . Hypertension 05/30/2017  . GERD (gastroesophageal reflux disease) 05/30/2017  . Parkinson disease ([place]) 10/29/2016  . History of bilateral hip replacements 05/28/2015  . Spinal stenosis, lumbar region, with neurogenic claudication 05/28/2015  . DDD (degenerative disc disease), lumbar 03/13/2015  . Lumbar post-laminectomy syndrome 03/13/2015  . Degenerative cervical disc 03/13/2015  . Status post bilateral total hip replacement 03/13/2015  . Sacroiliac joint dysfunction 03/13/2015  . History of surgery on upper extremity 03/13/2015  . DJD of shoulder 03/13/2015  . Rheumatoid arthritis ([place]) 02/17/2015      Prior to Admission medications   Medication Sig Start Date End Date Taking? Authorizing Provider  allopurinol (  ZYLOPRIM) 100 MG tablet Take 200 mg by mouth at bedtime.     [provider]  b complex vitamins tablet Take 1 tablet by mouth daily.    [provider]  Calcium Carbonate-Vitamin D (CALCIUM 600+D) 600-200 MG-UNIT TABS Take 1 tablet by mouth daily.    [provider]  carbidopa-levodopa (SINEMET CR) 50-200 MG per tablet Take 1 tablet by mouth at bedtime.     [provider]  cholecalciferol (VITAMIN D) 1000 units tablet Take 2,000 Units by  mouth daily.    [provider]  DULoxetine (CYMBALTA) 30 MG capsule Take 1 capsule by mouth daily. 08/05/18   [provider]  furosemide (LASIX) 20 MG tablet Take 30 mg by mouth.    [provider]  gabapentin (NEURONTIN) 100 MG capsule Limit 1 capsule by mouth per day or every other day if tolerated Patient taking differently: Take 100 mg by mouth 2 (two) times daily.  06/17/16   Mohammed Kindle, MD  hydroxychloroquine (PLAQUENIL) 200 MG tablet Take 400 mg by mouth at bedtime.     [provider]  latanoprost (XALATAN) 0.005 % ophthalmic solution Place 1 drop into the right eye at bedtime.  05/26/18   [provider]  losartan (COZAAR) 100 MG tablet Take 100 mg by mouth daily.     [provider]  metroNIDAZOLE (METROGEL) 0.75 % vaginal gel metronidazole 0.75 % vaginal gel    [provider]  oxyCODONE (OXY IR/ROXICODONE) 5 MG immediate release tablet Take 1 tablet by mouth 3 (three) times daily as needed. 07/28/18   [provider]  pantoprazole (PROTONIX) 40 MG tablet Take 1 tablet (40 mg total) by mouth 2 (two) times daily. 11/18/20   Merlyn Lot, MD  spironolactone (ALDACTONE) 25 MG tablet Take 1 tablet by mouth daily. 07/24/18   [provider]  tolterodine (DETROL LA) 4 MG 24 hr capsule Take 4 mg by mouth daily.    [provider]  vancomycin (VANCOCIN) 50 mg/mL oral solution Take 2.5 mLs (125 mg total) by mouth every 6 (six) hours. 10/27/18   Demetrios Loll, MD    Allergies Trospium, Iodinated diagnostic agents, and Streptomycin    Social History Social History   Tobacco Use  . Smoking status: Never Smoker  . Smokeless tobacco: Never Used  Substance Use Topics  . Alcohol use: No    Alcohol/week: 0.0 standard drinks  . Drug use: No    Review of Systems Patient denies headaches, rhinorrhea, blurry vision, numbness, shortness of breath, chest pain, edema, cough, abdominal pain, nausea,  vomiting, diarrhea, dysuria, fevers, rashes or hallucinations unless otherwise stated above in HPI. ____________________________________________   PHYSICAL EXAM:  VITAL SIGNS: Vitals:   11/18/20 1400 11/18/20 1430  BP: 124/85 (!) 128/53  Pulse: 62 (!) 59  Resp: 19 13  Temp:    SpO2: 97% 94%    Constitutional: Alert and oriented.  Eyes: Conjunctivae are normal.  Head: Atraumatic. Nose: No congestion/rhinnorhea. Mouth/Throat: Mucous membranes are moist.   Neck: No stridor. Painless ROM.  Cardiovascular: Normal rate, regular rhythm. Grossly normal heart sounds.  Good peripheral circulation. Respiratory: Normal respiratory effort.  No retractions. Lungs CTAB. Gastrointestinal: Soft and nontender. No distention. No abdominal bruits. No CVA tenderness. Genitourinary:  Musculoskeletal: No lower extremity tenderness nor edema.  No joint effusions. Neurologic:  Normal speech and language. No gross focal neurologic deficits are appreciated. No facial droop Skin:  Skin is warm, dry and intact. No rash noted. Psychiatric: Mood  and affect are normal. Speech and behavior are normal.  ____________________________________________   LABS (all labs ordered are listed, but only abnormal results are displayed)  No results found for this or any previous visit (from the past 24 hour(s)). ____________________________________________ ED ECG REPORT I, Merlyn Lot, the attending physician, personally viewed and interpreted this ECG.   Date: 11/18/2020  EKG Time: 15:17  Rate: 70  Rhythm: sinus  Axis: normal  Intervals:normal intervals,  ST&T Change: motion artifact from tremor, no stemi    ____________________________________________  RADIOLOGY ____________________________________________   PROCEDURES  Procedure(s) performed:  Procedures    Critical Care performed: no ____________________________________________   INITIAL IMPRESSION / ASSESSMENT AND PLAN / ED  COURSE  Pertinent labs & imaging results that were available during my care of the patient were reviewed by me and considered in my medical decision making (see chart for details).   DDX: food bolus impaction, esophagitis, gerd, mass, acs  Keshona Kartes is a 86 y.o. who presents to the ED with presentation as described above.  Patient nontoxic-appearing.  Presentation seems consistent with food bolus impaction.  Patient given glucagon and Zofran now tolerating ginger ale.  Not consistent with complete obstruction may be partial on.  Given her age and risk factors will check blood work as well as EKG of the lower suspicion for ACS or cardiopulmonary etiology.    Clinical Course as of 11/18/20 1533  Tue Nov 18, 2020  1444 Patient feeling improved.  She is tolerating ginger ale.  Still awaiting results of blood work as well as EKG and chest x-ray.  Patient will be signed out to oncoming physician pending follow-up on labs.  If these are normal anticipate discharge home.  Will be placed on PPI with GI follow-up.  Patient agreeable to plan. [PR]    Clinical Course User Index [PR] Merlyn Lot, MD    The patient was evaluated in Emergency Department today for the symptoms described in the history of present illness. He/she was evaluated in the context of the global COVID-19 pandemic, which necessitated consideration that the patient might be at risk for infection with the SARS-CoV-2 virus that causes COVID-19. Institutional protocols and algorithms that pertain to the evaluation of patients at risk for COVID-19 are in a state of rapid change based on information released by regulatory bodies including the CDC and federal and state organizations. These policies and algorithms were followed during the patient's care in the ED.  As part of my medical decision making, I reviewed the following data within the [place] notes reviewed and incorporated, Labs reviewed,  notes from prior ED visits and [employer] Controlled Substance Database   ____________________________________________   FINAL CLINICAL IMPRESSION(S) / ED DIAGNOSES  Final diagnoses:  Odynophagia      NEW MEDICATIONS STARTED DURING THIS VISIT:  Current Discharge Medication List       Note:  This document was prepared using Dragon voice recognition software and may include unintentional dictation errors.    Merlyn Lot, MD 11/18/20 1523    Merlyn Lot, MD 11/18/20 [phone]

## 2020-11-18 NOTE — ED Notes (Signed)
Pt states she feels like food bolus has moved down but is still stuck a little bit, just lower down. Vomited about 6-8 times since last night. Denies pain. A&) times 4. Pt has essential tremor in R hand.

## 2020-11-18 NOTE — ED Triage Notes (Signed)
Pt to ED POV from [facility] for chief complaint of dysphagia after eating brunsick stew last night and feeling like something is stuck.  Pt with bowl in triage that she has been spitting into but able to swallow, speaking in complete clear sentences, RR even and unlabored, NAD noted   Discussed pt with Dr Quentin Cornwall and no orders at this time

## 2020-11-18 NOTE — Discharge Instructions (Addendum)
Please eat soft foods only for the next few days.  Things like applesauce, protein shakes and soups.  Avoid meats like pot roast or chicken as these are higher risk for getting stuck in your throat.  Drink plenty of fluids.  Take your Lasix twice over the next 3 days once in AM around 8AM and once 2pm given the chest x-ray is concerning for little bit of fluid on your lungs.  Follow-up with your primary care doctor about your right leg being larger than your left and to see if they would like to do an ultrasound of it.  Return to the ER if develop any other concerns

## 2021-03-25 ENCOUNTER — Other Ambulatory Visit: Payer: Self-pay | Admitting: Internal Medicine

## 2021-03-25 DIAGNOSIS — Z1231 Encounter for screening mammogram for malignant neoplasm of breast: Secondary | ICD-10-CM

## 2021-04-15 ENCOUNTER — Ambulatory Visit
Admission: RE | Admit: 2021-04-15 | Discharge: 2021-04-15 | Disposition: A | Discharge status: Home / Self Care | Payer: Medicare Other | Source: Ambulatory Visit | Attending: Internal Medicine | Admitting: Internal Medicine

## 2021-04-15 ENCOUNTER — Other Ambulatory Visit: Payer: Self-pay

## 2021-04-15 DIAGNOSIS — Z1231 Encounter for screening mammogram for malignant neoplasm of breast: Secondary | ICD-10-CM

## 2021-10-06 DIAGNOSIS — I5189 Other ill-defined heart diseases: Secondary | ICD-10-CM | POA: Insufficient documentation

## 2021-10-18 DIAGNOSIS — H409 Unspecified glaucoma: Secondary | ICD-10-CM | POA: Insufficient documentation

## 2021-10-18 DIAGNOSIS — K439 Ventral hernia without obstruction or gangrene: Secondary | ICD-10-CM | POA: Insufficient documentation

## 2021-10-18 DIAGNOSIS — M549 Dorsalgia, unspecified: Secondary | ICD-10-CM | POA: Insufficient documentation

## 2021-10-18 DIAGNOSIS — I110 Hypertensive heart disease with heart failure: Secondary | ICD-10-CM | POA: Insufficient documentation

## 2021-10-18 DIAGNOSIS — Z87440 Personal history of urinary (tract) infections: Secondary | ICD-10-CM | POA: Insufficient documentation

## 2021-10-18 DIAGNOSIS — R32 Unspecified urinary incontinence: Secondary | ICD-10-CM | POA: Insufficient documentation

## 2021-10-18 DIAGNOSIS — I083 Combined rheumatic disorders of mitral, aortic and tricuspid valves: Secondary | ICD-10-CM | POA: Insufficient documentation

## 2021-10-31 ENCOUNTER — Encounter: Payer: Self-pay | Admitting: Emergency Medicine

## 2021-10-31 ENCOUNTER — Emergency Department: Payer: Medicare Other | Admitting: Anesthesiology

## 2021-10-31 ENCOUNTER — Emergency Department: Payer: Medicare Other

## 2021-10-31 ENCOUNTER — Ambulatory Visit
Admission: EM | Admit: 2021-10-31 | Discharge: 2021-10-31 | Disposition: A | Discharge status: Home / Self Care | Payer: Medicare Other | Attending: Emergency Medicine | Admitting: Emergency Medicine

## 2021-10-31 ENCOUNTER — Other Ambulatory Visit: Payer: Self-pay

## 2021-10-31 ENCOUNTER — Encounter
Admission: EM | Disposition: A | Discharge status: Home / Self Care | Payer: Self-pay | Source: Home / Self Care | Attending: Emergency Medicine

## 2021-10-31 DIAGNOSIS — T84021A Dislocation of internal left hip prosthesis, initial encounter: Secondary | ICD-10-CM | POA: Diagnosis not present

## 2021-10-31 DIAGNOSIS — K219 Gastro-esophageal reflux disease without esophagitis: Secondary | ICD-10-CM | POA: Insufficient documentation

## 2021-10-31 DIAGNOSIS — Y792 Prosthetic and other implants, materials and accessory orthopedic devices associated with adverse incidents: Secondary | ICD-10-CM | POA: Insufficient documentation

## 2021-10-31 DIAGNOSIS — G4730 Sleep apnea, unspecified: Secondary | ICD-10-CM | POA: Diagnosis not present

## 2021-10-31 DIAGNOSIS — Z6830 Body mass index (BMI) 30.0-30.9, adult: Secondary | ICD-10-CM | POA: Insufficient documentation

## 2021-10-31 DIAGNOSIS — W01190A Fall on same level from slipping, tripping and stumbling with subsequent striking against furniture, initial encounter: Secondary | ICD-10-CM | POA: Insufficient documentation

## 2021-10-31 DIAGNOSIS — G20 Parkinson's disease: Secondary | ICD-10-CM | POA: Diagnosis not present

## 2021-10-31 DIAGNOSIS — Z96641 Presence of right artificial hip joint: Secondary | ICD-10-CM | POA: Insufficient documentation

## 2021-10-31 DIAGNOSIS — I10 Essential (primary) hypertension: Secondary | ICD-10-CM | POA: Insufficient documentation

## 2021-10-31 DIAGNOSIS — Z853 Personal history of malignant neoplasm of breast: Secondary | ICD-10-CM | POA: Insufficient documentation

## 2021-10-31 DIAGNOSIS — S73006A Unspecified dislocation of unspecified hip, initial encounter: Secondary | ICD-10-CM

## 2021-10-31 DIAGNOSIS — E669 Obesity, unspecified: Secondary | ICD-10-CM | POA: Diagnosis not present

## 2021-10-31 DIAGNOSIS — K449 Diaphragmatic hernia without obstruction or gangrene: Secondary | ICD-10-CM | POA: Insufficient documentation

## 2021-10-31 DIAGNOSIS — S73005A Unspecified dislocation of left hip, initial encounter: Secondary | ICD-10-CM

## 2021-10-31 HISTORY — PX: HIP CLOSED REDUCTION: SHX983

## 2021-10-31 LAB — CBC WITH DIFFERENTIAL/PLATELET
Abs Immature Granulocytes: 0.03 10*3/uL (ref 0.00–0.07)
Basophils Absolute: 0 10*3/uL (ref 0.0–0.1)
Basophils Relative: 1 %
Eosinophils Absolute: 0.3 10*3/uL (ref 0.0–0.5)
Eosinophils Relative: 3 %
HCT: 40.7 % (ref 36.0–46.0)
Hemoglobin: 13.1 g/dL (ref 12.0–15.0)
Immature Granulocytes: 0 %
Lymphocytes Relative: 41 %
Lymphs Abs: 3.6 10*3/uL (ref 0.7–4.0)
MCH: 33.2 pg (ref 26.0–34.0)
MCHC: 32.2 g/dL (ref 30.0–36.0)
MCV: 103 fL — ABNORMAL HIGH (ref 80.0–100.0)
Monocytes Absolute: 0.5 10*3/uL (ref 0.1–1.0)
Monocytes Relative: 6 %
Neutro Abs: 4.3 10*3/uL (ref 1.7–7.7)
Neutrophils Relative %: 49 %
Platelets: 217 10*3/uL (ref 150–400)
RBC: 3.95 MIL/uL (ref 3.87–5.11)
RDW: 14.2 % (ref 11.5–15.5)
WBC: 8.7 10*3/uL (ref 4.0–10.5)
nRBC: 0 % (ref 0.0–0.2)

## 2021-10-31 LAB — BASIC METABOLIC PANEL
Anion gap: 9 (ref 5–15)
BUN: 48 mg/dL — ABNORMAL HIGH (ref 8–23)
CO2: 23 mmol/L (ref 22–32)
Calcium: 9.3 mg/dL (ref 8.9–10.3)
Chloride: 102 mmol/L (ref 98–111)
Creatinine, Ser: 1.44 mg/dL — ABNORMAL HIGH (ref 0.44–1.00)
GFR, Estimated: 36 mL/min — ABNORMAL LOW (ref >60)
Glucose, Bld: 91 mg/dL (ref 70–99)
Potassium: 4.7 mmol/L (ref 3.5–5.1)
Sodium: 134 mmol/L — ABNORMAL LOW (ref 135–145)

## 2021-10-31 LAB — PROTIME-INR
INR: 1 (ref 0.8–1.2)
Prothrombin Time: 13.4 seconds (ref 11.4–15.2)

## 2021-10-31 LAB — TYPE AND SCREEN
ABO/RH(D): O POS
Antibody Screen: NEGATIVE

## 2021-10-31 SURGERY — CLOSED REDUCTION, HIP
Anesthesia: General | Site: Hip | Laterality: Left

## 2021-10-31 MED ORDER — FENTANYL CITRATE (PF) 100 MCG/2ML IJ SOLN
INTRAMUSCULAR | Status: DC | PRN
  Administered 2021-10-31 (×2): 25 ug via INTRAVENOUS
  Administered 2021-10-31: 50 ug via INTRAVENOUS

## 2021-10-31 MED ORDER — METOCLOPRAMIDE HCL 5 MG/ML IJ SOLN: 5.0000 mg | Freq: Three times a day (TID) | INTRAMUSCULAR | Status: DC | PRN

## 2021-10-31 MED ORDER — FENTANYL CITRATE (PF) 100 MCG/2ML IJ SOLN
INTRAMUSCULAR | Status: AC
  Filled 2021-10-31: qty 2

## 2021-10-31 MED ORDER — PROPOFOL 10 MG/ML IV BOLUS
INTRAVENOUS | Status: AC
  Filled 2021-10-31: qty 20

## 2021-10-31 MED ORDER — MIDAZOLAM HCL 2 MG/2ML IJ SOLN
INTRAMUSCULAR | Status: AC | PRN
  Administered 2021-10-31: 2 mg via INTRAVENOUS

## 2021-10-31 MED ORDER — PROPOFOL 500 MG/50ML IV EMUL: INTRAVENOUS | Status: DC | PRN

## 2021-10-31 MED ORDER — ONDANSETRON HCL 4 MG PO TABS: 4.0000 mg | ORAL_TABLET | Freq: Four times a day (QID) | ORAL | Status: DC | PRN

## 2021-10-31 MED ORDER — HYDROMORPHONE HCL 1 MG/ML IJ SOLN
0.5000 mg | Freq: Once | INTRAMUSCULAR | Status: AC
  Administered 2021-10-31: 0.5 mg via INTRAVENOUS
  Filled 2021-10-31: qty 1

## 2021-10-31 MED ORDER — MIDAZOLAM HCL 2 MG/2ML IJ SOLN
INTRAMUSCULAR | Status: AC
  Filled 2021-10-31: qty 6

## 2021-10-31 MED ORDER — SODIUM CHLORIDE 0.9 % IV SOLN: INTRAVENOUS | Status: DC

## 2021-10-31 MED ORDER — LACTATED RINGERS IV SOLN: INTRAVENOUS | Status: DC | PRN

## 2021-10-31 MED ORDER — ACETAMINOPHEN 325 MG PO TABS: 325.0000 mg | ORAL_TABLET | Freq: Four times a day (QID) | ORAL | Status: DC | PRN

## 2021-10-31 MED ORDER — PROPOFOL 10 MG/ML IV BOLUS
INTRAVENOUS | Status: DC | PRN
Start: 2021-10-31 — End: 2021-10-31
  Administered 2021-10-31: 20 mg via INTRAVENOUS
  Administered 2021-10-31: 100 mg via INTRAVENOUS
  Administered 2021-10-31: 20 mg via INTRAVENOUS

## 2021-10-31 MED ORDER — FENTANYL CITRATE PF 50 MCG/ML IJ SOSY
PREFILLED_SYRINGE | INTRAMUSCULAR | Status: DC
Start: 2021-10-31 — End: 2021-10-31
  Filled 2021-10-31: qty 4

## 2021-10-31 MED ORDER — LIDOCAINE HCL (PF) 2 % IJ SOLN
INTRAMUSCULAR | Status: AC
  Filled 2021-10-31: qty 5

## 2021-10-31 MED ORDER — ONDANSETRON HCL 4 MG/2ML IJ SOLN: 4.0000 mg | Freq: Four times a day (QID) | INTRAMUSCULAR | Status: DC | PRN

## 2021-10-31 MED ORDER — METOCLOPRAMIDE HCL 10 MG PO TABS: 5.0000 mg | ORAL_TABLET | Freq: Three times a day (TID) | ORAL | Status: DC | PRN

## 2021-10-31 MED ORDER — SUCCINYLCHOLINE CHLORIDE 200 MG/10ML IV SOSY
PREFILLED_SYRINGE | INTRAVENOUS | Status: AC
  Filled 2021-10-31: qty 10

## 2021-10-31 MED ORDER — SODIUM CHLORIDE 0.9 % IV SOLN
INTRAVENOUS | Status: AC | PRN
  Administered 2021-10-31: 1000 mL via INTRAVENOUS

## 2021-10-31 MED ORDER — EPHEDRINE SULFATE 50 MG/ML IJ SOLN
INTRAMUSCULAR | Status: DC | PRN
  Administered 2021-10-31: 10 mg via INTRAVENOUS

## 2021-10-31 MED ORDER — SUCCINYLCHOLINE CHLORIDE 200 MG/10ML IV SOSY
PREFILLED_SYRINGE | INTRAVENOUS | Status: DC | PRN
Start: 2021-10-31 — End: 2021-10-31
  Administered 2021-10-31: 100 mg via INTRAVENOUS

## 2021-10-31 MED ORDER — LIDOCAINE HCL (CARDIAC) PF 100 MG/5ML IV SOSY
PREFILLED_SYRINGE | INTRAVENOUS | Status: DC | PRN
  Administered 2021-10-31: 100 mg via INTRAVENOUS

## 2021-10-31 MED ORDER — OXYCODONE HCL 5 MG PO TABS: 5.0000 mg | ORAL_TABLET | ORAL | Status: DC | PRN

## 2021-10-31 MED ORDER — FENTANYL CITRATE (PF) 100 MCG/2ML IJ SOLN
INTRAMUSCULAR | Status: AC | PRN
  Administered 2021-10-31: 100 ug via INTRAVENOUS

## 2021-10-31 MED ORDER — FENTANYL CITRATE (PF) 100 MCG/2ML IJ SOLN: 25.0000 ug | INTRAMUSCULAR | Status: DC | PRN

## 2021-10-31 SURGICAL SUPPLY — 13 items
GAUZE 4X4 16PLY ~~LOC~~+RFID DBL (SPONGE) ×1 IMPLANT
GLOVE SURG ENC MOIS LTX SZ8 (GLOVE) ×2 IMPLANT
GOWN STRL REUS W/ TWL LRG LVL3 (GOWN DISPOSABLE) ×1 IMPLANT
GOWN STRL REUS W/ TWL XL LVL3 (GOWN DISPOSABLE) ×1 IMPLANT
GOWN STRL REUS W/TWL LRG LVL3 (GOWN DISPOSABLE)
GOWN STRL REUS W/TWL XL LVL3 (GOWN DISPOSABLE)
HOLSTER ELECTROSUGICAL PENCIL (MISCELLANEOUS) ×1 IMPLANT
IMMBOLIZER KNEE 19 BLUE UNIV (SOFTGOODS) ×1 IMPLANT
KIT TURNOVER KIT A (KITS) ×2 IMPLANT
MANIFOLD NEPTUNE II (INSTRUMENTS) ×1 IMPLANT
PACK HIP PROSTHESIS (MISCELLANEOUS) ×1 IMPLANT
SPONGE T-LAP 18X18 ~~LOC~~+RFID (SPONGE) ×4 IMPLANT
WATER STERILE IRR 500ML POUR (IV SOLUTION) ×1 IMPLANT

## 2021-10-31 NOTE — Anesthesia Preprocedure Evaluation (Signed)
Anesthesia Evaluation  Patient identified by MRN, date of birth, ID band Patient awake    Reviewed: Allergy & Precautions, NPO status , Patient's Chart, lab work & pertinent test results  History of Anesthesia Complications Negative for: history of anesthetic complications  Airway Mallampati: II       Dental  (+) Upper Dentures, Lower Dentures, Dental Advidsory Given   Pulmonary neg shortness of breath, sleep apnea , neg COPD, neg recent URI,           Cardiovascular Exercise Tolerance: Good hypertension, Pt. on medications (-) angina(-) CAD, (-) Past MI, (-) Cardiac Stents and (-) CABG (-) dysrhythmias (-) Valvular Problems/Murmurs Rhythm:Regular Rate:Normal     Neuro/Psych Parkinson's negative neurological ROS  negative psych ROS   GI/Hepatic Neg liver ROS, hiatal hernia, GERD  ,  Endo/Other  negative endocrine ROS  Renal/GU negative Renal ROS     Musculoskeletal   Abdominal (+) + obese,   Peds negative pediatric ROS (+)  Hematology negative hematology ROS (+)   Anesthesia Other Findings Past Medical History: No date: Back pain 2007: Breast cancer ([place])     Comment:  right breast lumpectomy with rad tx 2007: Cancer ([place])     Comment:  right breast No date: Collagen vascular disease ([place]) No date: Gout No date: Hard of hearing No date: Hypertension No date: Parkinson's disease ([place]) 2007: Personal history of radiation therapy     Comment:  F/U right breast cancer   Reproductive/Obstetrics negative OB ROS                             Anesthesia Physical  Anesthesia Plan  ASA: 3  Anesthesia Plan: General   Post-op Pain Management:    Induction: Intravenous  PONV Risk Score and Plan: 3 and Propofol infusion and TIVA  Airway Management Planned: Mask  Additional Equipment:   Intra-op Plan:   Post-operative Plan:   Informed Consent: I have reviewed the patients  History and Physical, chart, labs and discussed the procedure including the risks, benefits and alternatives for the proposed anesthesia with the patient or authorized representative who has indicated his/her understanding and acceptance.       Plan Discussed with: Surgeon  Anesthesia Plan Comments:         Anesthesia Quick Evaluation

## 2021-10-31 NOTE — Op Note (Signed)
10/31/2021  3:59 PM  Patient:   Yvonne Singleton  Pre-Op Diagnosis:   Closed posterior left prothesic hip dislocation.  Post-Op Diagnosis:   Same.  Procedure:   Closed reduction of left prosthetic hip dislocation.  Surgeon:   Pascal Lux, MD  Assistant:   None  Anesthesia:   IV sedation  Findings:   As above.  Complications:   None  EBL:   None  Fluids:   350 cc crystalloid  TT:   None  Drains:   None  Closure:   None  Implants:   None  Brief Clinical Note:   The patient is an 87 year old female who is now many years status post a left total hip arthroplasty with numerous recurrent hip dislocations, the most recent of which was approximately 5 years ago, according to the medical records available. The patient was in her usual state of health earlier today when she apparently twisted awkwardly while opening some blinds in her apartment and fell against a couch, read dislocating her left total hip arthroplasty. The patient is brought to the operating room at this time for closed reduction under general anesthesia of this dislocation.  Procedure:   The patient was brought into the operating room and lain in the supine position. After adequate IV sedation was obtained, a timeout was performed to verify the appropriate surgical site. The hip was then reduced using longitudinal traction with internal rotation and flexion while countertraction was applied to the pelvis. The hip was felt to clunk back into place. The leg lengths were now equal and leg rotation was symmetric to the contralateral side. The hip was stable to flexion to 90 with internal rotation to 10. The adequacy of reduction was confirmed by fluoroscopic imaging before the patient's left lower extremity was placed into a knee immobilizer. The patient was then awakened, extubated, and returned to the recovery room in satisfactory condition after tolerating the procedure well.

## 2021-10-31 NOTE — Sedation Documentation (Signed)
O2 sat decreased 89%, pt placed on 2L 

## 2021-10-31 NOTE — ED Provider Notes (Signed)
[facility] Provider Note    Event Date/Time   First MD Initiated Contact with Patient 10/31/21 1225     (approximate)   History   Hip Pain   HPI  Yameli Delamater is a 87 y.o. female with PMH of breast cancer, Parkinson's, and left hip arthroplasty status post dislocations in the past who presents with left hip pain, acute onset this afternoon when she was standing and twisted to turn around.  She then fell down onto her lower leg and felt a pop similar to prior dislocations.  She denies hitting her head or any other injuries.  She has no back pain.  The hip hurts with any movement.      Physical Exam   Triage Vital Signs: ED Triage Vitals  Enc Vitals Group     BP 10/31/21 1224 124/63     Pulse Rate 10/31/21 1224 73     Resp 10/31/21 1224 20     Temp 10/31/21 1224 97.7 F (36.5 C)     Temp Source 10/31/21 1224 Oral     SpO2 10/31/21 1224 98 %     Weight 10/31/21 1217 175 lb (79.4 kg)     Height 10/31/21 1217 5' 3.5" (1.613 m)     Head Circumference --      Peak Flow --      Pain Score --      Pain Loc --      Pain Edu? --      Excl. in [place]? --     Most recent vital signs: Vitals:   10/31/21 1415 10/31/21 1430  BP: (!) 98/52 117/67  Pulse: (!) 58 (!) 58  Resp: 15 13  Temp:    SpO2: 99% 100%     General: Awake, no distress.  CV:  Good peripheral perfusion.  Resp:  Normal effort.  Abd:  No distention.  Other:  Left hip held in internal rotation.  Pain on any range of motion.  2+ DP pulse and normal motor and sensory to the distal left leg.   ED Results / Procedures / Treatments   Labs (all labs ordered are listed, but only abnormal results are displayed) Labs Reviewed  BASIC METABOLIC PANEL - Abnormal; Notable for the following components:      Result Value   Sodium 134 (*)    BUN 48 (*)    Creatinine, Ser 1.44 (*)    GFR, Estimated 36 (*)    All other components within normal limits  CBC WITH DIFFERENTIAL/PLATELET  - Abnormal; Notable for the following components:   MCV 103.0 (*)    All other components within normal limits  PROTIME-INR  TYPE AND SCREEN     EKG    RADIOLOGY  XR L hip: I reviewed the images which show a dislocation of the patient's hip prosthesis.  Review of the radiology report confirms superolateral left hip dislocation with no evidence of fracture.  PROCEDURES:  Critical Care performed: No  Reduction of dislocation  Date/Time: 10/31/2021 2:20 PM Performed by: Arta Silence, MD Authorized by: Arta Silence, MD  Consent: Verbal consent obtained. Written consent obtained. Risks and benefits: risks, benefits and alternatives were discussed Consent given by: patient Patient identity confirmed: verbally with patient and arm band Time out: Immediately prior to procedure a "time out" was called to verify the correct patient, procedure, equipment, support staff and site/side marked as required. Local anesthesia used: no  Anesthesia: Local anesthesia used: no  Sedation: Patient  sedated: yes Sedation type: moderate (conscious) sedation Sedatives: diazepam and fentanyl Vitals: Vital signs were monitored during sedation.  Patient tolerance: patient tolerated the procedure well with no immediate complications Comments: Left hip dislocation under moderate sedation.  2 attempts made with direct traction.  The patient was unable to continue the procedure due to pain on attempted range of motion of the hip during the reduction attempts.  Procedure was discontinued and the patient returned to her preprocedure baseline without complication.   .Sedation  Date/Time: 10/31/2021 2:22 PM Performed by: Arta Silence, MD Authorized by: Arta Silence, MD   Consent:    Consent obtained:  Written   Consent given by:  Patient   Risks discussed:  Prolonged hypoxia resulting in organ damage, prolonged sedation necessitating reversal, dysrhythmia, inadequate sedation  and respiratory compromise necessitating ventilatory assistance and intubation   Alternatives discussed:  Analgesia without sedation Universal protocol:    Immediately prior to procedure, a time out was called: yes     Patient identity confirmed:  Verbally with patient and arm band Indications:    Procedure performed:  Dislocation reduction   Procedure necessitating sedation performed by:  Physician performing sedation Pre-sedation assessment:    Time since last food or drink:  6 hours   ASA classification: class 2 - patient with mild systemic disease     Mallampati score:  I - soft palate, uvula, fauces, pillars visible   Pre-sedation assessments completed and reviewed: airway patency, cardiovascular function, mental status, nausea/vomiting, pain level and respiratory function   Immediate pre-procedure details:    Reassessment: Patient reassessed immediately prior to procedure     Reviewed: vital signs     Verified: bag valve mask available, emergency equipment available and intubation equipment available   Procedure details (see MAR for exact dosages):    Preoxygenation:  Room air   Sedation:  Midazolam   Intended level of sedation: moderate (conscious sedation)   Analgesia:  Fentanyl   Intra-procedure monitoring:  Blood pressure monitoring, continuous capnometry, frequent LOC assessments, frequent vital sign checks, continuous pulse oximetry and cardiac monitor   Intra-procedure events: hypoxia     Intra-procedure management:  Airway repositioning and supplemental oxygen   Total Provider sedation time (minutes):  10 Post-procedure details:    Attendance: Constant attendance by certified staff until patient recovered     Recovery: Patient returned to pre-procedure baseline     Patient is stable for discharge or admission: yes     Procedure completion:  Tolerated well, no immediate complications Comments:     Patient had hypoxia to 88% after initial dose of fentanyl and Versed.  This  resolved with supplemental oxygen and repositioning.  However, patient was unable to proceed with reduction due to pain and decision was made not to continue with additional sedation.  The patient otherwise tolerated the procedure well and returned to her preprocedure baseline.   MEDICATIONS ORDERED IN ED: Medications  fentaNYL (SUBLIMAZE) 50 MCG/ML injection (  Not Given 10/31/21 1427)  HYDROmorphone (DILAUDID) injection 0.5 mg (0.5 mg Intravenous Given 10/31/21 1301)  fentaNYL (SUBLIMAZE) injection (100 mcg Intravenous Given 10/31/21 1351)  midazolam (VERSED) injection (2 mg Intravenous Given 10/31/21 1352)  0.9 %  sodium chloride infusion (1,000 mLs Intravenous New Bag/Given 10/31/21 1350)     IMPRESSION / MDM / ASSESSMENT AND PLAN / ED COURSE  I reviewed the triage vital signs and the nursing notes.  87 year old female with PMH as noted above presents with recurrent hip dislocation.  I reviewed the  x-ray which confirms this finding.  I reviewed the past medical records including prior orthopedic notes.  The patient last had a hip dislocation in 2017.  I consulted Dr. Roland Rack from orthopedics.  He recommended attempting reduction under moderate sedation in the ED.  The patient was consented for reduction under moderate sedation.  Sedation was performed using IV Versed and fentanyl.  After the initial dose of medication, the patient had mild hypoxia which resolved with verbal stimulation and supplemental oxygen.  Her blood pressure was also borderline low.  However, she was unable to tolerate the reduction attempts due to continued pain.  At that time, based on the patient's age and comorbidities, I felt that it would be unsafe to give any additional sedation medications and discontinue to the ED reduction attempt.  The patient otherwise tolerated the sedation well and returned to her preprocedure baseline within a short time.  I then discussed the case again with Dr. Roland Rack who agreed to take the  patient to the OR for reduction under anesthesia.   FINAL CLINICAL IMPRESSION(S) / ED DIAGNOSES   Final diagnoses:  Dislocated hip ([place])     Rx / DC Orders   ED Discharge Orders     None        Note:  This document was prepared using Dragon voice recognition software and may include unintentional dictation errors.    Arta Silence, MD 10/31/21 1511

## 2021-10-31 NOTE — Transfer of Care (Signed)
Immediate Anesthesia Transfer of Care Note  Patient: Yvonne Singleton  Procedure(s) Performed: CLOSED REDUCTION HIP (Left: Hip)  Patient Location: PACU  Anesthesia Type:   Level of Consciousness: awake  Airway & Oxygen Therapy: Patient connected to nasal cannula oxygen  Post-op Assessment: Report given to RN  Post vital signs: stable  Last Vitals:  Vitals Value Taken Time  BP    Temp    Pulse    Resp    SpO2      Last Pain:  Vitals:   10/31/21 1224  TempSrc: Oral         Complications: No notable events documented.

## 2021-10-31 NOTE — ED Notes (Signed)
Patient transported to X-ray 

## 2021-10-31 NOTE — Consult Note (Signed)
Subjective:  Chief complaint: Left hip pain and deformity.  The patient is a 87 y.o. female who sustained an injury to the left hip earlier this morning.  Apparently she went to put up her blinds.  As she turned, she stumbled and fell against the couch, feeling a pop in her hip in the process.  The patient was brought to the emergency room where x-rays demonstrated a posterior superiorly dislocated left total hip arthroplasty.  An attempt was made to reduce the hip under sedation in the emergency room but this was unsuccessful as the patient began to desaturate despite inadequate pain control.  The patient denies any associated injury.  The patient did not strike her head or lose consciousness.  She also denies any light-headedness, dizziness, chest pain, or shortness of breath which might have contributed to the injury.  Patient Active Problem List   Diagnosis Date Noted   Regional enteritis of small bowel ([place]) 10/24/2018   Clostridium difficile infection 10/24/2018   Lower extremity edema 06/27/2018   Hyperlipidemia 05/30/2017   Hypertension 05/30/2017   GERD (gastroesophageal reflux disease) 05/30/2017   Parkinson disease ([place]) 10/29/2016   History of bilateral hip replacements 05/28/2015   Spinal stenosis, lumbar region, with neurogenic claudication 05/28/2015   DDD (degenerative disc disease), lumbar 03/13/2015   Lumbar post-laminectomy syndrome 03/13/2015   Degenerative cervical disc 03/13/2015   Status post bilateral total hip replacement 03/13/2015   Sacroiliac joint dysfunction 03/13/2015   History of surgery on upper extremity 03/13/2015   DJD of shoulder 03/13/2015   Rheumatoid arthritis ([place]) 02/17/2015   Past Medical History:  Diagnosis Date   Back pain    Breast cancer ([place]) 2007   right breast lumpectomy with rad tx   Cancer ([place]) 2007   right breast   Collagen vascular disease ([place])    Gout    Hard of hearing    Hypertension    Parkinson's disease ([place])     Personal history of radiation therapy 2007   F/U right breast cancer    Past Surgical History:  Procedure Laterality Date   ABDOMINAL HYSTERECTOMY     BREAST LUMPECTOMY Right 2007   suspicious calcs, f/u with radiation   CATARACT EXTRACTION Bilateral    ESOPHAGOGASTRODUODENOSCOPY (EGD) WITH PROPOFOL N/A 05/25/2017   Procedure: ESOPHAGOGASTRODUODENOSCOPY (EGD) WITH PROPOFOL;  Surgeon: Lollie Sails, MD;  Location[facility] ENDOSCOPY;  Service: Endoscopy;  Laterality: N/A;   ESOPHAGOGASTRODUODENOSCOPY (EGD) WITH PROPOFOL N/A 05/09/2018   Procedure: ESOPHAGOGASTRODUODENOSCOPY (EGD) WITH PROPOFOL;  Surgeon: Lin Landsman, MD;  Location: [facility] ENDOSCOPY;  Service: Gastroenterology;  Laterality: N/A;   EYE SURGERY Bilateral    HAND SURGERY     HIP CLOSED REDUCTION Left 06/24/2015   Procedure: CLOSED REDUCTION HIP;  Surgeon: Dereck Leep, MD;  Location: [facility] ORS;  Service: Orthopedics;  Laterality: Left;   HIP CLOSED REDUCTION Left 02/09/2016   Procedure: CLOSED MANIPULATION HIP;  Surgeon: Earnestine Leys, MD;  Location: [facility] ORS;  Service: Orthopedics;  Laterality: Left;   HIP SURGERY     JOINT REPLACEMENT     bilateral hip   OOPHORECTOMY     TOTAL KNEE ARTHROPLASTY Bilateral     (Not in a hospital admission)  Allergies  Allergen Reactions   Trospium Nausea And Vomiting   Iodinated Contrast Media Hives   Streptomycin Hives    Social History   Tobacco Use   Smoking status: Never   Smokeless tobacco: Never  Substance Use Topics   Alcohol use: No  Alcohol/week: 0.0 standard drinks    Family History  Problem Relation Age of Onset   Heart disease Mother    Arthritis Mother    Heart disease Father    Ulcers Father    Breast cancer Maternal Aunt      Review of Systems: As noted above. The patient denies any chest pain, shortness of breath, nausea, vomiting, diarrhea, constipation, belly pain, blood in his/her stool, or burning with urination.  Objective: Temp:  [97.7 F  (36.5 C)] 97.7 F (36.5 C) (01/14 1224) Pulse Rate:  [58-80] 58 (01/14 1430) Resp:  [11-20] 13 (01/14 1430) BP: (97-133)/(52-84) 117/67 (01/14 1430) SpO2:  [89 %-100 %] 100 % (01/14 1430) Weight:  [79.4 kg] 79.4 kg (01/14 1217)  Physical Exam: General:  Alert, no acute distress Psychiatric:  Patient is competent for consent with normal mood and affect Cardiovascular:  RRR  Respiratory:  Clear to auscultation. No wheezing. Non-labored breathing GI:  Abdomen is soft and non-tender Skin:  No lesions in the area of chief complaint Neurologic:  Sensation intact distally Lymphatic:  No axillary or cervical lymphadenopathy  Orthopedic Exam:  Orthopedic examination is limited to the left hip and lower extremity.  The leg is clearly shortened and held in a flexed and internally rotated position.  Skin inspection around the left hip is notable for well-healed surgical incision which is without evidence for infection.  No obvious swelling, erythema, ecchymosis, abrasions, or other skin abnormalities are identified.  She has neurovascularly intact to the left lower extremity and foot.  Imaging Review: Recent x-rays of the pelvis and left hip are available for review.  These films demonstrate a clearly posterior superiorly dislocated left prosthetic hip.  The femoral and acetabular components appear to be well fixed and without evidence of loosening.  Incidental note is made of a well-positioned right total hip arthroplasty.  These components are without evidence of loosening.  The femoral head is concentrically located within the acetabulum.  No acute fractures or other bony abnormalities are identified.  Assessment: Closed posterior superior left prosthetic hip dislocation.  Plan: The treatment options, including both surgical and nonsurgical choices, have been discussed in detail with the patient and her son, who is at the bedside.  They agree to proceed with a closed reduction of the prosthetic  left hip dislocation under general anesthesia.  The risks (including bleeding, infection, nerve and/or blood vessel injury, persistent or recurrent pain, loosening or failure of the components, leg length inequality, recurrent dislocation, need for further surgery, blood clots, strokes, heart attacks or arrhythmias, pneumonia, etc.) and benefits of the surgical procedure were discussed.  The patient and her son state their understanding and agree to proceed.  A formal written consent will be obtained by the nursing staff.

## 2021-10-31 NOTE — ED Notes (Signed)
Dr. Roland Rack at bedside.

## 2021-10-31 NOTE — ED Triage Notes (Signed)
Pt via EMS from home. Pt c/o L hip pain, pt states it gets dislocated often. States that she was twisting to turn. Denies any falls. Denies blood thinners. Pt is A&Ox4 and NAD

## 2021-10-31 NOTE — Discharge Instructions (Addendum)
Orthopedic discharge instructions: May shower tomorrow. Apply ice frequently to hip. Take ibuprofen 600 mg TID with meals for 3-5 days, then as necessary. Take supplement with ES Tylenol when needed.  May take oxycodone for more severe pain if necessary. Keep knee immobilizer on at all times until after follow-up with Dr. Marry Guan or his PA in 2 weeks. May weight-bear as tolerated in knee immobilizer - use crutches or walker as needed.   AMBULATORY SURGERY  DISCHARGE INSTRUCTIONS   The drugs that you were given will stay in your system until tomorrow so for the next 24 hours you should not:  Drive an automobile Make any legal decisions Drink any alcoholic beverage   You may resume regular meals tomorrow.  Today it is better to start with liquids and gradually work up to solid foods.  You may eat anything you prefer, but it is better to start with liquids, then soup and crackers, and gradually work up to solid foods.   Please notify your doctor immediately if you have any unusual bleeding, trouble breathing, redness and pain at the surgery site, drainage, fever, or pain not relieved by medication.    Additional Instructions:        Please contact your physician with any problems or Same Day Surgery at [phone], Monday through Friday 6 am to 4 pm, or [place] at [facility] number at [phone].

## 2021-10-31 NOTE — Anesthesia Postprocedure Evaluation (Signed)
Anesthesia Post Note  Patient: Yvonne Singleton  Procedure(s) Performed: CLOSED REDUCTION HIP (Left: Hip)  Patient location during evaluation: PACU Anesthesia Type: General Level of consciousness: awake and alert Pain management: pain level controlled Vital Signs Assessment: post-procedure vital signs reviewed and stable Respiratory status: spontaneous breathing, nonlabored ventilation, respiratory function stable and patient connected to nasal cannula oxygen Cardiovascular status: blood pressure returned to baseline and stable Postop Assessment: no apparent nausea or vomiting Anesthetic complications: no   No notable events documented.   Last Vitals:  Vitals:   10/31/21 1610 10/31/21 1620  BP: (!) 134/94 (!) 134/94  Pulse: 84 80  Resp: 16 16  Temp:  36.5 C  SpO2: 94% 94%    Last Pain:  Vitals:   10/31/21 1620  TempSrc: Tympanic  PainSc: 0-No pain                 Martha Clan

## 2021-11-02 ENCOUNTER — Encounter: Payer: Self-pay | Admitting: Surgery

## 2021-11-02 ENCOUNTER — Ambulatory Visit: Payer: Medicare Other | Admitting: Surgery

## 2021-11-16 ENCOUNTER — Encounter: Payer: Self-pay | Admitting: Surgery

## 2021-11-16 ENCOUNTER — Ambulatory Visit (INDEPENDENT_AMBULATORY_CARE_PROVIDER_SITE_OTHER): Payer: Medicare Other | Admitting: Surgery

## 2021-11-16 ENCOUNTER — Telehealth: Payer: Self-pay

## 2021-11-16 ENCOUNTER — Other Ambulatory Visit: Payer: Self-pay

## 2021-11-16 VITALS — BP 96/57 | HR 83 | Temp 98.3°F | Ht 63.5 in | Wt 168.0 lb

## 2021-11-16 DIAGNOSIS — K449 Diaphragmatic hernia without obstruction or gangrene: Secondary | ICD-10-CM | POA: Diagnosis not present

## 2021-11-16 NOTE — Telephone Encounter (Signed)
Patient called back and returned patient called and left a message for call back

## 2021-11-16 NOTE — Telephone Encounter (Signed)
Per Dr. Marius Ditch patient needs a EGD schedule for history of large paraesophageal hernia Called and left a message for call back.

## 2021-11-16 NOTE — Telephone Encounter (Signed)
Patient returned call for [facility]

## 2021-11-16 NOTE — Patient Instructions (Addendum)
We are going to send a referral to Dr Marius Ditch at [facility] to have an Upper Endoscopy done, since she did your last one.  They will call you to get this scheduled.   We want you to have a swallowing study done to assess the hiatal hernia.  You are scheduled for a Barium Swallow study at [facility] on 11/24/21. You will arrive by 9:30 AM and enter in through the [facility] Entrance. You will have nothing to eat or drink for 3 hours prior.   We will also get the images from you CT scan you had done at [facility].   We will have you come back here to discuss the next steps after we get these results. It may take a couple of months to get these done. We will call you to schedule your follow up once you get scheduled for your Upper Endoscopy.     Hiatal Hernia A hiatal hernia occurs when part of the stomach slides above the muscle that separates the abdomen from the chest (diaphragm). A person can be born with a hiatal hernia (congenital), or it may develop over time. In almost all cases of hiatal hernia, only the top part of the stomach pushes through the diaphragm. Many people have a hiatal hernia with no symptoms. The larger the hernia, the more likely it is that you will have symptoms. In some cases, a hiatal hernia allows stomach acid to flow back into the tube that carries food from your mouth to your stomach (esophagus). This may cause heartburn symptoms. Severe heartburn symptoms may mean that you have developed a condition called gastroesophageal reflux disease (GERD). What are the causes? This condition is caused by a weakness in the opening (hiatus) where the esophagus passes through the diaphragm to attach to the upper part of the stomach. A person may be born with a weakness in the hiatus, or a weakness can develop over time. What increases the risk? This condition is more likely to develop in: Older people. Age is a major risk factor for a hiatal hernia, especially if you are over the age of  107. Pregnant women. People who are overweight. People who have frequent constipation. What are the signs or symptoms? Symptoms of this condition usually develop in the form of GERD symptoms. Symptoms include: Heartburn. Belching. Indigestion. Trouble swallowing. Coughing or wheezing. Sore throat. Hoarseness. Chest pain. Nausea and vomiting. How is this diagnosed? This condition may be diagnosed during testing for GERD. Tests that may be done include: X-rays of your stomach or chest. An upper gastrointestinal (GI) series. This is an X-ray exam of your GI tract that is taken after you swallow a chalky liquid that shows up clearly on the X-ray. Endoscopy. This is a procedure to look into your stomach using a thin, flexible tube that has a tiny camera and light on the end of it. How is this treated? This condition may be treated by: Dietary and lifestyle changes to help reduce GERD symptoms. Medicines. These may include: Over-the-counter antacids. Medicines that make your stomach empty more quickly. Medicines that block the production of stomach acid (H2 blockers). Stronger medicines to reduce stomach acid (proton pump inhibitors). Surgery to repair the hernia, if other treatments are not helping. If you have no symptoms, you may not need treatment. Follow these instructions at home: Lifestyle and activity Do not use any products that contain nicotine or tobacco, such as cigarettes and e-cigarettes. If you need help quitting, ask your health care  provider. Try to achieve and maintain a healthy body weight. Avoid putting pressure on your abdomen. Anything that puts pressure on your abdomen increases the amount of acid that may be pushed up into your esophagus. Avoid bending over, especially after eating. Raise the head of your bed by putting blocks under the legs. This keeps your head and esophagus higher than your stomach. Do not wear tight clothing around your chest or stomach. Try  not to strain when having a bowel movement, when urinating, or when lifting heavy objects. Eating and drinking Avoid foods that can worsen GERD symptoms. These may include: Fatty foods, like fried foods. Citrus fruits, like oranges or lemon. Other foods and drinks that contain acid, like orange juice or tomatoes. Spicy food. Chocolate. Eat frequent small meals instead of three large meals a day. This helps prevent your stomach from getting too full. Eat slowly. Do not lie down right after eating. Do not eat 1-2 hours before bed. Do not drink beverages with caffeine. These include cola, coffee, cocoa, and tea. Do not drink alcohol. General instructions Take over-the-counter and prescription medicines only as told by your health care provider. Keep all follow-up visits as told by your health care provider. This is important. Contact a health care provider if: Your symptoms are not controlled with medicines or lifestyle changes. You are having trouble swallowing. You have coughing or wheezing that will not go away. Get help right away if: Your pain is getting worse. Your pain spreads to your arms, neck, jaw, teeth, or back. You have shortness of breath. You sweat for no reason. You feel sick to your stomach (nauseous) or you vomit. You vomit blood. You have bright red blood in your stools. You have black, tarry stools. Summary A hiatal hernia occurs when part of the stomach slides above the muscle that separates the abdomen from the chest (diaphragm). A person may be born with a weakness in the hiatus, or a weakness can develop over time. Symptoms of hiatal hernia may include heartburn, trouble swallowing, or sore throat. Management of hiatal hernia includes eating frequent small meals instead of three large meals a day. Get help right away if you vomit blood, have bright red blood in your stools, or have black, tarry stools. This information is not intended to replace advice given  to you by your health care provider. Make sure you discuss any questions you have with your health care provider. Document Revised: 09/04/2020 Document Reviewed: 09/04/2020 Elsevier Patient Education  2022 [employer].

## 2021-11-17 ENCOUNTER — Other Ambulatory Visit: Payer: Self-pay

## 2021-11-17 DIAGNOSIS — K449 Diaphragmatic hernia without obstruction or gangrene: Secondary | ICD-10-CM

## 2021-11-17 NOTE — Telephone Encounter (Signed)
Called patient made appointment for 12/09/21 with D.r Marius Ditch at [facility] over instructions and sent instructions in the mail

## 2021-11-17 NOTE — Progress Notes (Signed)
Patient ID: Yvonne Singleton, female   DOB: May 16, 1935, 87 y.o.   MRN: [id number]  HPI Yvonne Singleton is a 87 y.o. female seen in consultation at the request of Dr. Humphrey Rolls for a giant type III paraesophageal hernia.  Please note that I have personally reviewed the CT scan showing evidence of two thirds of the stomach within the mediastinum.  There is no evidence of volvulized.  She also had an EGD showing a large paraesophageal hernia.  She has symptoms on a daily basis.  She has some left shoulder pain and retrosternal pain usually after eating.  She has some swallowing issues.  She has had require multiple esophageal dilations given prior food impactions.  She is currently on nursing home.  Had recent hip surgery by Dr. Benson Setting and is currently on crutches.  She does have chronic renal insufficiency with a baseline creatinine of 1.44 her CBC is completely normal. She had prior lower midline laparotomy incision from pelvic surgery several decades ago HPI  Past Medical History:  Diagnosis Date   Back pain    Breast cancer ([place]) 2007   right breast lumpectomy with rad tx   Cancer [facility]) 2007   right breast   Collagen vascular disease ([place])    Gout    Hard of hearing    Hypertension    Parkinson's disease ([place])    Personal history of radiation therapy 2007   F/U right breast cancer    Past Surgical History:  Procedure Laterality Date   ABDOMINAL HYSTERECTOMY     BREAST LUMPECTOMY Right 2007   suspicious calcs, f/u with radiation   CATARACT EXTRACTION Bilateral    ESOPHAGOGASTRODUODENOSCOPY (EGD) WITH PROPOFOL N/A 05/25/2017   Procedure: ESOPHAGOGASTRODUODENOSCOPY (EGD) WITH PROPOFOL;  Surgeon: Lollie Sails, MD;  Location: [facility] ENDOSCOPY;  Service: Endoscopy;  Laterality: N/A;   ESOPHAGOGASTRODUODENOSCOPY (EGD) WITH PROPOFOL N/A 05/09/2018   Procedure: ESOPHAGOGASTRODUODENOSCOPY (EGD) WITH PROPOFOL;  Surgeon: Lin Landsman, MD;  Location: [facility] ENDOSCOPY;  Service:  Gastroenterology;  Laterality: N/A;   EYE SURGERY Bilateral    HAND SURGERY     HIP CLOSED REDUCTION Left 06/24/2015   Procedure: CLOSED REDUCTION HIP;  Surgeon: Dereck Leep, MD;  Location: [facility] ORS;  Service: Orthopedics;  Laterality: Left;   HIP CLOSED REDUCTION Left 02/09/2016   Procedure: CLOSED MANIPULATION HIP;  Surgeon: Earnestine Leys, MD;  Location: [facility] ORS;  Service: Orthopedics;  Laterality: Left;   HIP CLOSED REDUCTION Left 10/31/2021   Procedure: CLOSED REDUCTION HIP;  Surgeon: Corky Mull, MD;  Location: [facility] ORS;  Service: Orthopedics;  Laterality: Left;   HIP SURGERY     JOINT REPLACEMENT     bilateral hip   OOPHORECTOMY     TOTAL KNEE ARTHROPLASTY Bilateral     Family History  Problem Relation Age of Onset   Heart disease Mother    Arthritis Mother    Heart disease Father    Ulcers Father    Breast cancer Maternal Aunt     Social History Social History   Tobacco Use   Smoking status: Never   Smokeless tobacco: Never  Substance Use Topics   Alcohol use: No    Alcohol/week: 0.0 standard drinks   Drug use: No    Allergies  Allergen Reactions   Trospium Nausea And Vomiting   Iodinated Contrast Media Hives   Streptomycin Hives    Current Outpatient Medications  Medication Sig Dispense Refill   allopurinol (ZYLOPRIM) 100 MG tablet Take 200 mg by  mouth at bedtime.      b complex vitamins tablet Take 1 tablet by mouth daily.     Calcium Carbonate-Vitamin D 600-200 MG-UNIT TABS Take 1 tablet by mouth daily.     carbidopa-levodopa (SINEMET CR) 50-200 MG per tablet Take 1 tablet by mouth at bedtime.      cholecalciferol (VITAMIN D) 1000 units tablet Take 2,000 Units by mouth daily.     DULoxetine (CYMBALTA) 30 MG capsule Take 1 capsule by mouth daily.  2   furosemide (LASIX) 20 MG tablet Take 30 mg by mouth.     gabapentin (NEURONTIN) 100 MG capsule Limit 1 capsule by mouth per day or every other day if tolerated (Patient taking differently: Take 100 mg by  mouth 2 (two) times daily.) 30 capsule 0   losartan (COZAAR) 100 MG tablet Take 100 mg by mouth daily.      MYRBETRIQ 25 MG TB24 tablet Take 25 mg by mouth daily.     oxyCODONE (OXY IR/ROXICODONE) 5 MG immediate release tablet Take 1 tablet by mouth 3 (three) times daily as needed.  0   pantoprazole (PROTONIX) 40 MG tablet Take 1 tablet (40 mg total) by mouth 2 (two) times daily. 30 tablet 1   sertraline (ZOLOFT) 100 MG tablet Take 100 mg by mouth daily.     tolterodine (DETROL LA) 4 MG 24 hr capsule Take 4 mg by mouth daily.     Current Facility-Administered Medications  Medication Dose Route Frequency Provider Last Rate Last Admin   midazolam (VERSED) 5 MG/5ML injection 5 mg  5 mg Intravenous Once Mohammed Kindle, MD       orphenadrine (NORFLEX) injection 60 mg  60 mg Intramuscular Once Mohammed Kindle, MD         Review of Systems Full ROS  was asked and was negative except for the information on the HPI  Physical Exam Blood pressure (!) 96/57, pulse 83, temperature 98.3 F (36.8 C), height 5' 3.5" (1.613 m), weight 168 lb (76.2 kg), SpO2 97 %. CONSTITUTIONAL: debilitated comes in crutches. Significant arthritis deformities in wrist. She is completely competent. EYES: Pupils are equal, round, and reactive to light, Sclera are non-icteric. EARS, NOSE, MOUTH AND THROAT: The oropharynx is clear. The oral mucosa is pink and moist. Hearing is intact to voice. LYMPH NODES:  Lymph nodes in the neck are normal. RESPIRATORY:  Lungs are clear. There is normal respiratory effort, with equal breath sounds bilaterally, and without pathologic use of accessory muscles. CARDIOVASCULAR: Heart is regular without murmurs, gallops, or rubs. GI: The abdomen is soft, nontender, and nondistended. There are no palpable masses. There is no hepatosplenomegaly. There are normal bowel sounds Lower midline laparotomy scar with incisional periumbilical hernia measuring 3 cm.  GU: Rectal deferred.   MUSCULOSKELETAL:  Normal muscle strength and tone. No cyanosis or edema.   SKIN: Turgor is good and there are no pathologic skin lesions or ulcers. NEUROLOGIC: Motor and sensation is grossly normal. Cranial nerves are grossly intact. PSYCH:  Oriented to person, place and time. Affect is normal.  Data Reviewed  I have personally reviewed the patient's imaging, laboratory findings and medical records.    Assessment/Plan 87 year old female with a giant paraesophageal hernia type III.  She is debilitated chronically ill and frail with significant mobility issues.  On the other hand her paraesophageal hernia causes significant quality of life issues on a daily basis.  This is a very difficult position since she is at high risk for developing perioperative  complications.  I had an extensive discussion with the patient and the daughter regarding her disease process and the realistic expectation.  She wishes to be more independent but apparently currently because of the severe symptoms associated with a paraesophageal hernia she is unable to do so.  She just had another hip surgery and has had significant mobility issues for the last few weeks.  I had advised her to recover from her hip as much as possible and in the interim to perform work-up for potential surgery if she makes some improvement regarding her mobility and if she wishes to proceed regarding this being a very risky surgery. I have asked Dr. Marius Ditch to do EGD and will order barium swallow to further  evaluate anatomy  Caroleen Hamman, MD FACS General Surgeon 11/17/2021, 8:07 PM

## 2021-11-19 ENCOUNTER — Ambulatory Visit
Admission: RE | Admit: 2021-11-19 | Discharge: 2021-11-19 | Disposition: A | Discharge status: Home / Self Care | Payer: Self-pay | Source: Ambulatory Visit | Attending: Surgery | Admitting: Surgery

## 2021-11-19 ENCOUNTER — Other Ambulatory Visit: Payer: Self-pay

## 2021-11-19 DIAGNOSIS — K449 Diaphragmatic hernia without obstruction or gangrene: Secondary | ICD-10-CM

## 2021-11-24 ENCOUNTER — Other Ambulatory Visit: Payer: Self-pay | Admitting: Surgery

## 2021-11-24 ENCOUNTER — Other Ambulatory Visit: Payer: Self-pay

## 2021-11-24 ENCOUNTER — Ambulatory Visit
Admission: RE | Admit: 2021-11-24 | Discharge: 2021-11-24 | Disposition: A | Discharge status: Home / Self Care | Payer: Medicare Other | Source: Ambulatory Visit | Attending: Surgery | Admitting: Surgery

## 2021-11-24 DIAGNOSIS — K449 Diaphragmatic hernia without obstruction or gangrene: Secondary | ICD-10-CM

## 2021-12-09 ENCOUNTER — Encounter
Admission: RE | Disposition: A | Discharge status: Home / Self Care | Payer: Self-pay | Source: Home / Self Care | Attending: Gastroenterology

## 2021-12-09 ENCOUNTER — Ambulatory Visit: Payer: Medicare Other | Admitting: Anesthesiology

## 2021-12-09 ENCOUNTER — Ambulatory Visit
Admission: RE | Admit: 2021-12-09 | Discharge: 2021-12-09 | Disposition: A | Discharge status: Home / Self Care | Payer: Medicare Other | Attending: Gastroenterology | Admitting: Gastroenterology

## 2021-12-09 DIAGNOSIS — K319 Disease of stomach and duodenum, unspecified: Secondary | ICD-10-CM | POA: Diagnosis not present

## 2021-12-09 DIAGNOSIS — K219 Gastro-esophageal reflux disease without esophagitis: Secondary | ICD-10-CM | POA: Insufficient documentation

## 2021-12-09 DIAGNOSIS — K449 Diaphragmatic hernia without obstruction or gangrene: Secondary | ICD-10-CM

## 2021-12-09 DIAGNOSIS — K2970 Gastritis, unspecified, without bleeding: Secondary | ICD-10-CM | POA: Diagnosis not present

## 2021-12-09 DIAGNOSIS — R933 Abnormal findings on diagnostic imaging of other parts of digestive tract: Secondary | ICD-10-CM | POA: Diagnosis not present

## 2021-12-09 DIAGNOSIS — I10 Essential (primary) hypertension: Secondary | ICD-10-CM | POA: Diagnosis not present

## 2021-12-09 DIAGNOSIS — R1013 Epigastric pain: Secondary | ICD-10-CM | POA: Diagnosis not present

## 2021-12-09 DIAGNOSIS — K2950 Unspecified chronic gastritis without bleeding: Secondary | ICD-10-CM | POA: Insufficient documentation

## 2021-12-09 HISTORY — PX: ESOPHAGOGASTRODUODENOSCOPY (EGD) WITH PROPOFOL: SHX5813

## 2021-12-09 SURGERY — ESOPHAGOGASTRODUODENOSCOPY (EGD) WITH PROPOFOL
Anesthesia: General

## 2021-12-09 MED ORDER — PROPOFOL 10 MG/ML IV BOLUS
INTRAVENOUS | Status: DC | PRN
  Administered 2021-12-09: 50 mg via INTRAVENOUS
  Administered 2021-12-09: 60 mg via INTRAVENOUS

## 2021-12-09 MED ORDER — PANTOPRAZOLE SODIUM 40 MG PO TBEC: 40.0000 mg | DELAYED_RELEASE_TABLET | Freq: Two times a day (BID) | ORAL | 6 refills | Status: AC

## 2021-12-09 MED ORDER — LIDOCAINE HCL (CARDIAC) PF 100 MG/5ML IV SOSY
PREFILLED_SYRINGE | INTRAVENOUS | Status: DC | PRN
  Administered 2021-12-09: 80 mg via INTRAVENOUS

## 2021-12-09 MED ORDER — SODIUM CHLORIDE 0.9 % IV SOLN
INTRAVENOUS | Status: DC
  Administered 2021-12-09: 20 mL/h via INTRAVENOUS

## 2021-12-09 NOTE — H&P (Signed)
Cephas Darby, MD [address]  [place]  [place], [place] [address]  Main: [phone]  Fax: [phone] Pager: [phone]  Primary Care Physician:  Perrin Maltese, MD Primary Gastroenterologist:  Dr. Cephas Darby  Pre-Procedure History & Physical: HPI:  Yvonne Singleton is a 87 y.o. female is here for an endoscopy.   Past Medical History:  Diagnosis Date   Back pain    Breast cancer ([place]) 2007   right breast lumpectomy with rad tx   Cancer [facility]) 2007   right breast   Collagen vascular disease ([place])    Gout    Hard of hearing    Hypertension    Parkinson's disease ([place])    Personal history of radiation therapy 2007   F/U right breast cancer    Past Surgical History:  Procedure Laterality Date   ABDOMINAL HYSTERECTOMY     BREAST LUMPECTOMY Right 2007   suspicious calcs, f/u with radiation   CATARACT EXTRACTION Bilateral    ESOPHAGOGASTRODUODENOSCOPY (EGD) WITH PROPOFOL N/A 05/25/2017   Procedure: ESOPHAGOGASTRODUODENOSCOPY (EGD) WITH PROPOFOL;  Surgeon: Lollie Sails, MD;  Location: [facility] ENDOSCOPY;  Service: Endoscopy;  Laterality: N/A;   ESOPHAGOGASTRODUODENOSCOPY (EGD) WITH PROPOFOL N/A 05/09/2018   Procedure: ESOPHAGOGASTRODUODENOSCOPY (EGD) WITH PROPOFOL;  Surgeon: Lin Landsman, MD;  Location: [facility] ENDOSCOPY;  Service: Gastroenterology;  Laterality: N/A;   EYE SURGERY Bilateral    HAND SURGERY     HIP CLOSED REDUCTION Left 06/24/2015   Procedure: CLOSED REDUCTION HIP;  Surgeon: Dereck Leep, MD;  Location: [facility] ORS;  Service: Orthopedics;  Laterality: Left;   HIP CLOSED REDUCTION Left 02/09/2016   Procedure: CLOSED MANIPULATION HIP;  Surgeon: Earnestine Leys, MD;  Location: [facility] ORS;  Service: Orthopedics;  Laterality: Left;   HIP CLOSED REDUCTION Left 10/31/2021   Procedure: CLOSED REDUCTION HIP;  Surgeon: Corky Mull, MD;  Location: [facility] ORS;  Service: Orthopedics;  Laterality: Left;   HIP SURGERY     JOINT REPLACEMENT     bilateral  hip   OOPHORECTOMY     TOTAL KNEE ARTHROPLASTY Bilateral     Prior to Admission medications   Medication Sig Start Date End Date Taking? Authorizing Provider  allopurinol (ZYLOPRIM) 100 MG tablet Take 200 mg by mouth at bedtime.    Yes [provider]  b complex vitamins tablet Take 1 tablet by mouth daily.   Yes [provider]  Calcium Carbonate-Vitamin D 600-200 MG-UNIT TABS Take 1 tablet by mouth daily.   Yes [provider]  carbidopa-levodopa (SINEMET CR) 50-200 MG per tablet Take 1 tablet by mouth at bedtime.    Yes [provider]  cholecalciferol (VITAMIN D) 1000 units tablet Take 2,000 Units by mouth daily.   Yes [provider]  DULoxetine (CYMBALTA) 30 MG capsule Take 1 capsule by mouth daily. 08/05/18  Yes [provider]  furosemide (LASIX) 20 MG tablet Take 30 mg by mouth.   Yes [provider]  gabapentin (NEURONTIN) 100 MG capsule Limit 1 capsule by mouth per day or every other day if tolerated Patient taking differently: Take 100 mg by mouth 2 (two) times daily. 06/17/16  Yes Mohammed Kindle, MD  losartan (COZAAR) 100 MG tablet Take 100 mg by mouth daily.    Yes [provider]  MYRBETRIQ 25 MG TB24 tablet Take 25 mg by mouth daily. 10/08/21  Yes [provider]  oxyCODONE (OXY IR/ROXICODONE) 5 MG immediate release tablet Take 1 tablet by mouth 3 (three)  times daily as needed. 07/28/18  Yes [provider]  pantoprazole (PROTONIX) 40 MG tablet Take 1 tablet (40 mg total) by mouth 2 (two) times daily. 11/18/20  Yes Merlyn Lot, MD  sertraline (ZOLOFT) 100 MG tablet Take 100 mg by mouth daily. 09/30/21  Yes [provider]  tolterodine (DETROL LA) 4 MG 24 hr capsule Take 4 mg by mouth daily.   Yes [provider]    Allergies as of 11/17/2021 - Review Complete 11/16/2021  Allergen Reaction Noted   Trospium Nausea And Vomiting 01/26/2016   Iodinated contrast media  Hives 01/28/2015   Streptomycin Hives 01/28/2015    Family History  Problem Relation Age of Onset   Heart disease Mother    Arthritis Mother    Heart disease Father    Ulcers Father    Breast cancer Maternal Aunt     Social History   Socioeconomic History   Marital status: Widowed    Spouse name: Not on file   Number of children: Not on file   Years of education: Not on file   Highest education level: Not on file  Occupational History   Not on file  Tobacco Use   Smoking status: Never   Smokeless tobacco: Never  Substance and Sexual Activity   Alcohol use: No    Alcohol/week: 0.0 standard drinks   Drug use: No   Sexual activity: Not on file  Other Topics Concern   Not on file  Social History Narrative   Not on file   Social Determinants of Health   Financial Resource Strain: Not on file  Food Insecurity: Not on file  Transportation Needs: Not on file  Physical Activity: Not on file  Stress: Not on file  Social Connections: Not on file  Intimate Partner Violence: Not on file    Review of Systems: See HPI, otherwise negative ROS  Physical Exam: BP 122/61    Pulse 72    Temp (!) 96.1 F (35.6 C) (Temporal)    Resp 20    Ht 5' 3.5" (1.613 m)    Wt 76.7 kg    SpO2 98%    BMI 29.47 kg/m  General:   Alert,  pleasant and cooperative in NAD Head:  Normocephalic and atraumatic. Neck:  Supple; no masses or thyromegaly. Lungs:  Clear throughout to auscultation.    Heart:  Regular rate and rhythm. Abdomen:  Soft, nontender and nondistended. Normal bowel sounds, without guarding, and without rebound.   Neurologic:  Alert and  oriented x4;  grossly normal neurologically.  Impression/Plan: Yvonne Singleton is here for an endoscopy to be performed for preop evaluation, paraesophageal hernia   Risks, benefits, limitations, and alternatives regarding  endoscopy have been reviewed with the patient.  Questions have been answered.  All parties agreeable.   Sherri Sear, MD  12/09/2021, 9:17 AM

## 2021-12-09 NOTE — Op Note (Signed)
[facility] Gastroenterology Patient Name: Yvonne Singleton Procedure Date: 12/09/2021 9:15 AM MRN: [id number] Account #: [id number] Date of Birth: [birth year] Admit Type: Outpatient Age: 87 Room: [facility] ENDO ROOM 4 Gender: Female Note Status: Finalized Instrument Name: Upper Endoscope [id number] Procedure:             Upper GI endoscopy Indications:           Epigastric abdominal pain, , Esophageal reflux                         symptoms that persist despite appropriate therapy,                         Abnormal UGI series, Preoperative assessment, history                         of paraesophageal hernia Providers:             Lin Landsman MD, MD Medicines:             General Anesthesia Complications:         No immediate complications. Estimated blood loss: None. Procedure:             Pre-Anesthesia Assessment:                        - Prior to the procedure, a History and Physical was                         performed, and patient medications and allergies were                         reviewed. The patient is competent. The risks and                         benefits of the procedure and the sedation options and                         risks were discussed with the patient. All questions                         were answered and informed consent was obtained.                         Patient identification and proposed procedure were                         verified by the physician, the nurse, the                         anesthesiologist, the anesthetist and the technician                         in the pre-procedure area in the procedure room in the                         endoscopy suite. Mental Status Examination: alert and                         oriented.  Airway Examination: normal oropharyngeal                         airway and neck mobility. Respiratory Examination:                         clear to auscultation. CV Examination: normal.                          Prophylactic Antibiotics: The patient does not require                         prophylactic antibiotics. Prior Anticoagulants: The                         patient has taken no previous anticoagulant or                         antiplatelet agents. ASA Grade Assessment: III - A                         patient with severe systemic disease. After reviewing                         the risks and benefits, the patient was deemed in                         satisfactory condition to undergo the procedure. The                         anesthesia plan was to use general anesthesia.                         Immediately prior to administration of medications,                         the patient was re-assessed for adequacy to receive                         sedatives. The heart rate, respiratory rate, oxygen                         saturations, blood pressure, adequacy of pulmonary                         ventilation, and response to care were monitored                         throughout the procedure. The physical status of the                         patient was re-assessed after the procedure.                        After obtaining informed consent, the endoscope was                         passed under direct vision. Throughout the procedure,  the patient's blood pressure, pulse, and oxygen                         saturations were monitored continuously. The                         Endosonoscope was introduced through the mouth, and                         advanced to the second part of duodenum. The upper GI                         endoscopy was accomplished without difficulty. The                         patient tolerated the procedure well. Findings:      The duodenal bulb and second portion of the duodenum were normal.      Diffuse severe inflammation characterized by congestion (edema),       erythema and friability was found in the entire examined stomach.        Biopsies were taken with a cold forceps for histology.      A 10 cm hiatal hernia was found. The proximal extent of the gastric       folds (end of tubular esophagus) was 30 cm from the incisors. The hiatal       narrowing was 40 cm from the incisors. The Z-line was 30 cm from the       incisors.      Esophagogastric landmarks were identified: the gastroesophageal junction       was found at 30 cm from the incisors.      The gastroesophageal junction and examined esophagus were normal. Impression:            - Normal duodenal bulb and second portion of the                         duodenum.                        - Chronic gastritis. Biopsied.                        - 10 cm hiatal hernia.                        - Esophagogastric landmarks identified.                        - Normal gastroesophageal junction and esophagus. Recommendation:        - Await pathology results.                        - Discharge patient to home (with escort).                        - Resume previous diet today.                        - Continue present medications.                        -  Follow an antireflux regimen indefinitely.                        - Use a proton pump inhibitor PO BID indefinitely                         until hernia is repaired.                        - Follow up with Dr Dahlia Byes for hernia repair Procedure Code(s):     --- Professional ---                        [phone], Esophagogastroduodenoscopy, flexible,                         transoral; with biopsy, single or multiple Diagnosis Code(s):     --- Professional ---                        K29.50, Unspecified chronic gastritis without bleeding                        K44.9, Diaphragmatic hernia without obstruction or                         gangrene                        Z01.818, Encounter for other preprocedural examination                        R10.13, Epigastric pain                        K21.9, Gastro-esophageal reflux disease without                          esophagitis                        R93.3, Abnormal findings on diagnostic imaging of                         other parts of digestive tract CPT copyright 2019 American Medical Association. All rights reserved. The codes documented in this report are preliminary and upon coder review may  be revised to meet current compliance requirements. Dr. Ulyess Mort Lin Landsman MD, MD 12/09/2021 9:48:19 AM This report has been signed electronically. Number of Addenda: 0 Note Initiated On: 12/09/2021 9:15 AM Estimated Blood Loss:  Estimated blood loss: none.      [facility]

## 2021-12-09 NOTE — Anesthesia Preprocedure Evaluation (Signed)
Anesthesia Evaluation  Patient identified by MRN, date of birth, ID band Patient awake    Reviewed: Allergy & Precautions, H&P , NPO status , Patient's Chart, lab work & pertinent test results, reviewed documented beta blocker date and time   Airway Mallampati: II   Neck ROM: full    Dental  (+) Poor Dentition   Pulmonary neg pulmonary ROS,    Pulmonary exam normal        Cardiovascular Exercise Tolerance: Poor hypertension, On Medications negative cardio ROS Normal cardiovascular exam Rhythm:regular Rate:Normal     Neuro/Psych negative neurological ROS  negative psych ROS   GI/Hepatic Neg liver ROS, GERD  Medicated,  Endo/Other  negative endocrine ROS  Renal/GU negative Renal ROS  negative genitourinary   Musculoskeletal   Abdominal   Peds  Hematology negative hematology ROS (+)   Anesthesia Other Findings Past Medical History: No date: Back pain 2007: Breast cancer ([place])     Comment:  right breast lumpectomy with rad tx 2007: Cancer ([place])     Comment:  right breast No date: Collagen vascular disease ([place]) No date: Gout No date: Hard of hearing No date: Hypertension No date: Parkinson's disease ([place]) 2007: Personal history of radiation therapy     Comment:  F/U right breast cancer Past Surgical History: No date: ABDOMINAL HYSTERECTOMY 2007: BREAST LUMPECTOMY; Right     Comment:  suspicious calcs, f/u with radiation No date: CATARACT EXTRACTION; Bilateral 05/25/2017: ESOPHAGOGASTRODUODENOSCOPY (EGD) WITH PROPOFOL; N/A     Comment:  Procedure: ESOPHAGOGASTRODUODENOSCOPY (EGD) WITH               PROPOFOL;  Surgeon: Lollie Sails, MD;  Location:               [facility] ENDOSCOPY;  Service: Endoscopy;  Laterality: N/A; 05/09/2018: ESOPHAGOGASTRODUODENOSCOPY (EGD) WITH PROPOFOL; N/A     Comment:  Procedure: ESOPHAGOGASTRODUODENOSCOPY (EGD) WITH               PROPOFOL;  Surgeon: Lin Landsman, MD;   Location:               [facility] ENDOSCOPY;  Service: Gastroenterology;  Laterality:               N/A; No date: EYE SURGERY; Bilateral No date: HAND SURGERY 06/24/2015: HIP CLOSED REDUCTION; Left     Comment:  Procedure: CLOSED REDUCTION HIP;  Surgeon: Dereck Leep, MD;  Location: [facility] ORS;  Service: Orthopedics;                Laterality: Left; 02/09/2016: HIP CLOSED REDUCTION; Left     Comment:  Procedure: CLOSED MANIPULATION HIP;  Surgeon: Earnestine Leys, MD;  Location: [facility] ORS;  Service: Orthopedics;                Laterality: Left; 10/31/2021: HIP CLOSED REDUCTION; Left     Comment:  Procedure: CLOSED REDUCTION HIP;  Surgeon: Corky Mull, MD;  Location: [facility] ORS;  Service: Orthopedics;                Laterality: Left; No date: HIP SURGERY No date: JOINT REPLACEMENT     Comment:  bilateral hip No date: OOPHORECTOMY No date: TOTAL KNEE ARTHROPLASTY; Bilateral   Reproductive/Obstetrics negative OB ROS  Anesthesia Physical Anesthesia Plan  ASA: 3  Anesthesia Plan: General   Post-op Pain Management:    Induction:   PONV Risk Score and Plan:   Airway Management Planned:   Additional Equipment:   Intra-op Plan:   Post-operative Plan:   Informed Consent: I have reviewed the patients History and Physical, chart, labs and discussed the procedure including the risks, benefits and alternatives for the proposed anesthesia with the patient or authorized representative who has indicated his/her understanding and acceptance.     Dental Advisory Given  Plan Discussed with: CRNA  Anesthesia Plan Comments:         Anesthesia Quick Evaluation

## 2021-12-09 NOTE — Transfer of Care (Signed)
Immediate Anesthesia Transfer of Care Note  Patient: Yvonne Singleton  Procedure(s) Performed: ESOPHAGOGASTRODUODENOSCOPY (EGD) WITH PROPOFOL  Patient Location: Endoscopy Unit  Anesthesia Type:General  Level of Consciousness: drowsy  Airway & Oxygen Therapy: Patient Spontanous Breathing  Post-op Assessment: Report given to RN and Post -op Vital signs reviewed and stable  Post vital signs: Reviewed and stable  Last Vitals:  Vitals Value Taken Time  BP 94/69 12/09/21 0942  Temp    Pulse 59 12/09/21 0942  Resp 22 12/09/21 0942  SpO2 96 % 12/09/21 0942    Last Pain:  Vitals:   12/09/21 0852  TempSrc: Temporal  PainSc: 0-No pain         Complications: No notable events documented.

## 2021-12-10 ENCOUNTER — Encounter: Payer: Self-pay | Admitting: Gastroenterology

## 2021-12-10 LAB — SURGICAL PATHOLOGY

## 2021-12-14 NOTE — Anesthesia Postprocedure Evaluation (Signed)
Anesthesia Post Note  Patient: Yvonne Singleton  Procedure(s) Performed: ESOPHAGOGASTRODUODENOSCOPY (EGD) WITH PROPOFOL  Patient location during evaluation: PACU Anesthesia Type: General Level of consciousness: awake and alert Pain management: pain level controlled Vital Signs Assessment: post-procedure vital signs reviewed and stable Respiratory status: spontaneous breathing, nonlabored ventilation, respiratory function stable and patient connected to nasal cannula oxygen Cardiovascular status: blood pressure returned to baseline and stable Postop Assessment: no apparent nausea or vomiting Anesthetic complications: no   No notable events documented.   Last Vitals:  Vitals:   12/09/21 0852 12/09/21 0942  BP: 122/61 94/69  Pulse: 72 (!) 59  Resp: 20 (!) 22  Temp: (!) 35.6 C (!) 35.8 C  SpO2: 98% 96%    Last Pain:  Vitals:   12/09/21 1011  TempSrc:   PainSc: 0-No pain                 Molli Barrows

## 2021-12-16 ENCOUNTER — Telehealth: Payer: Self-pay

## 2021-12-16 ENCOUNTER — Encounter: Payer: Self-pay | Admitting: Surgery

## 2021-12-16 ENCOUNTER — Other Ambulatory Visit: Payer: Self-pay

## 2021-12-16 ENCOUNTER — Ambulatory Visit (INDEPENDENT_AMBULATORY_CARE_PROVIDER_SITE_OTHER): Payer: Medicare Other | Admitting: Surgery

## 2021-12-16 VITALS — BP 111/67 | HR 63 | Temp 98.3°F | Ht 63.0 in | Wt 167.8 lb

## 2021-12-16 DIAGNOSIS — K449 Diaphragmatic hernia without obstruction or gangrene: Secondary | ICD-10-CM

## 2021-12-16 NOTE — Patient Instructions (Addendum)
We will send Cardiac Clearance to Dr.Saadat [facility].Please call his office to see if he needs to see you prior to providing surgical clearance. ? ?Please see your follow up appointment listed below.  ? ? ? ? ?

## 2021-12-16 NOTE — Telephone Encounter (Signed)
Cardiac Clearance faxed to Dr.Saadat Humphrey Rolls at this time- patient to return in 3 months to discuss surgery. ?

## 2021-12-17 ENCOUNTER — Telehealth: Payer: Self-pay

## 2021-12-18 NOTE — Progress Notes (Signed)
Outpatient Surgical Follow Up ? ?12/18/2021 ? ?Yvonne Singleton is an 87 y.o. female.  ? ?Chief Complaint  ?Patient presents with  ? Follow-up  ?  Hiatal hernia  ? ?HPI: Ms. Yvonne Singleton is an 87 year old female well-known to me with HI and paraesophageal hernia.  She does have significant symptoms including swallowing issues retrosternal pain.  She has had strictures requiring dilations in the past.  She is still using crutches and has not recovered completely from more recent hip surgery.  She is completely competent and comes accompanied by her daughter..  Creatinine is 1.4 which is baseline CBC is completely normal.  Recently she completed an upper endoscopy that I have personally reviewed showing a giant type III paraesophageal hernia.  Also completed a barium swallowing showing evidence of a large hiatal hernia with some narrowing at the GE junction and mild dysmotility. ?Have also discussed with her about potentially placing a G-tube as an alternative method. ? ?Past Medical History:  ?Diagnosis Date  ? Back pain   ? Breast cancer ([place]) 2007  ? right breast lumpectomy with rad tx  ? Cancer [facility]) 2007  ? right breast  ? Collagen vascular disease ([place])   ? Gout   ? Hard of hearing   ? Hypertension   ? Parkinson's disease ([place])   ? Personal history of radiation therapy 2007  ? F/U right breast cancer  ? ? ?Past Surgical History:  ?Procedure Laterality Date  ? ABDOMINAL HYSTERECTOMY    ? BREAST LUMPECTOMY Right 2007  ? suspicious calcs, f/u with radiation  ? CATARACT EXTRACTION Bilateral   ? ESOPHAGOGASTRODUODENOSCOPY (EGD) WITH PROPOFOL N/A 05/25/2017  ? Procedure: ESOPHAGOGASTRODUODENOSCOPY (EGD) WITH PROPOFOL;  Surgeon: Lollie Sails, MD;  Location: [facility] ENDOSCOPY;  Service: Endoscopy;  Laterality: N/A;  ? ESOPHAGOGASTRODUODENOSCOPY (EGD) WITH PROPOFOL N/A 05/09/2018  ? Procedure: ESOPHAGOGASTRODUODENOSCOPY (EGD) WITH PROPOFOL;  Surgeon: Lin Landsman, MD;  Location: [facility] ENDOSCOPY;  Service:  Gastroenterology;  Laterality: N/A;  ? ESOPHAGOGASTRODUODENOSCOPY (EGD) WITH PROPOFOL N/A 12/09/2021  ? Procedure: ESOPHAGOGASTRODUODENOSCOPY (EGD) WITH PROPOFOL;  Surgeon: Lin Landsman, MD;  Location: [facility] ENDOSCOPY;  Service: Gastroenterology;  Laterality: N/A;  ? EYE SURGERY Bilateral   ? HAND SURGERY    ? HIP CLOSED REDUCTION Left 06/24/2015  ? Procedure: CLOSED REDUCTION HIP;  Surgeon: Dereck Leep, MD;  Location: [facility] ORS;  Service: Orthopedics;  Laterality: Left;  ? HIP CLOSED REDUCTION Left 02/09/2016  ? Procedure: CLOSED MANIPULATION HIP;  Surgeon: Earnestine Leys, MD;  Location: [facility] ORS;  Service: Orthopedics;  Laterality: Left;  ? HIP CLOSED REDUCTION Left 10/31/2021  ? Procedure: CLOSED REDUCTION HIP;  Surgeon: Corky Mull, MD;  Location: [facility] ORS;  Service: Orthopedics;  Laterality: Left;  ? HIP SURGERY    ? JOINT REPLACEMENT    ? bilateral hip  ? OOPHORECTOMY    ? TOTAL KNEE ARTHROPLASTY Bilateral   ? ? ?Family History  ?Problem Relation Age of Onset  ? Heart disease Mother   ? Arthritis Mother   ? Heart disease Father   ? Ulcers Father   ? Breast cancer Maternal Aunt   ? ? ?Social History:  reports that she has never smoked. She has never used smokeless tobacco. She reports that she does not drink alcohol and does not use drugs. ? ?Allergies:  ?Allergies  ?Allergen Reactions  ? Trospium Nausea And Vomiting  ? Iodinated Contrast Media Hives  ? Streptomycin Hives  ? ? ?Medications reviewed. ? ? ? ?ROS ?Full ROS performed  and is otherwise negative other than what is stated in HPI ? ? ?BP 111/67   Pulse 63   Temp 98.3 ?F (36.8 ?C) (Oral)   Ht 5' 3"  (1.6 m)   Wt 167 lb 12.8 oz (76.1 kg)   SpO2 100%   BMI 29.72 kg/m?  ? ?Physical Exam ?Vitals and nursing note reviewed. Exam conducted with a chaperone present.  ?Constitutional:   ?   Appearance: Normal appearance. She is not ill-appearing.  ?Cardiovascular:  ?   Rate and Rhythm: Normal rate.  ?   Heart sounds: No murmur heard. ?  No friction rub.   ?Musculoskeletal:     ?   General: No swelling. Normal range of motion.  ?   Cervical back: Normal range of motion and neck supple. No rigidity or tenderness.  ?Skin: ?   General: Skin is warm and dry.  ?   Capillary Refill: Capillary refill takes less than 2 seconds.  ?Neurological:  ?   General: No focal deficit present.  ?   Mental Status: She is alert and oriented to person, place, and time.  ?Psychiatric:     ?   Mood and Affect: Mood normal.     ?   Behavior: Behavior normal.     ?   Thought Content: Thought content normal.     ?   Judgment: Judgment normal.  ? ?Assessment/Plan: ?87 year old female with a giant paraesophageal hernia type III.  She is debilitated chronically ill and frail with significant mobility issues.  On the other hand her paraesophageal hernia causes significant quality of life issues on a daily basis.  This is a very difficult position since she is at high risk for developing perioperative complications.  I had an extensive discussion  once again with the patient and the daughter regarding her disease process and the realistic expectation.  ? ? She does have severe symptoms associated with a paraesophageal hernia and her quality of life is suffering.  ? ?She has not completely recovered from her hip , we will give her a few more months and see her and at that time reevaluate her functional status.  If her symptoms still persist and impairing the quality of life and if her mobility and her overall frailty improves we may have to proceed with surgery.  She does understand that this is not to take lightly as she is frail and has multiple medical issues.  On the other hand she is completely competent and is having quality of life issues.  She is between a rock and a hard place and unfortunately no good options are available. ?He wishes to think about all her options and see [place] back in about 3 months. ?We will also make sure we get preoperative optimization by Dr. Celine Ahr who is her  cardiologist in the meantime. ?Please note that I spent at least 40 minutes in this encounter including personally reviewing imaging studies, placing orders, counseling the patient, coordinating her care and performing appropriate documentation. ? ?Caroleen Hamman, MD FACS ?General Surgeon  ?

## 2021-12-21 ENCOUNTER — Telehealth: Payer: Self-pay

## 2021-12-21 NOTE — Telephone Encounter (Signed)
Cardiac Clearance received from Dr Merlyn Albert at this time-medium risk -patient optimized for surgery. ?

## 2021-12-21 NOTE — Progress Notes (Unsigned)
Cardiology clearance received from Dr Mickeal Needy Khan's office. The patient is cleared at Medium risk.  ?

## 2021-12-21 NOTE — Telephone Encounter (Signed)
Error

## 2021-12-25 ENCOUNTER — Encounter: Payer: Self-pay | Admitting: Surgery

## 2022-01-14 ENCOUNTER — Other Ambulatory Visit (HOSPITAL_COMMUNITY): Payer: Self-pay | Admitting: Student

## 2022-01-14 ENCOUNTER — Other Ambulatory Visit: Payer: Self-pay | Admitting: Student

## 2022-01-14 DIAGNOSIS — R413 Other amnesia: Secondary | ICD-10-CM

## 2022-01-14 DIAGNOSIS — G20 Parkinson's disease: Secondary | ICD-10-CM

## 2022-01-26 ENCOUNTER — Ambulatory Visit: Payer: Medicare Other

## 2022-01-28 ENCOUNTER — Ambulatory Visit
Admission: RE | Admit: 2022-01-28 | Discharge: 2022-01-28 | Disposition: A | Discharge status: Home / Self Care | Payer: Medicare Other | Source: Ambulatory Visit | Attending: Student | Admitting: Student

## 2022-01-28 DIAGNOSIS — G20 Parkinson's disease: Secondary | ICD-10-CM | POA: Insufficient documentation

## 2022-01-28 DIAGNOSIS — R413 Other amnesia: Secondary | ICD-10-CM | POA: Diagnosis present

## 2022-01-28 DIAGNOSIS — G20A1 Parkinson's disease without dyskinesia, without mention of fluctuations: Secondary | ICD-10-CM

## 2022-03-17 ENCOUNTER — Telehealth: Payer: Self-pay | Admitting: Surgery

## 2022-03-17 NOTE — Telephone Encounter (Signed)
Patient calls to cancel her follow up appointment with Dr. Dahlia Byes for hiatal hernia.  States she has decided against having surgery.  Will call back if she changes her mind.

## 2022-03-22 ENCOUNTER — Ambulatory Visit: Payer: Medicare Other | Admitting: Surgery

## 2022-04-01 ENCOUNTER — Other Ambulatory Visit: Payer: Self-pay | Admitting: Internal Medicine

## 2022-04-06 ENCOUNTER — Other Ambulatory Visit: Payer: Self-pay

## 2022-04-06 ENCOUNTER — Emergency Department: Payer: Medicare Other

## 2022-04-06 ENCOUNTER — Inpatient Hospital Stay
Admission: EM | Admit: 2022-04-06 | Discharge: 2022-04-12 | DRG: 468 | Disposition: A | Discharge status: Skilled Nursing Facility | Payer: Medicare Other | Attending: Internal Medicine | Admitting: Internal Medicine

## 2022-04-06 ENCOUNTER — Encounter (INDEPENDENT_AMBULATORY_CARE_PROVIDER_SITE_OTHER): Payer: Medicare Other | Admitting: Vascular Surgery

## 2022-04-06 DIAGNOSIS — Z853 Personal history of malignant neoplasm of breast: Secondary | ICD-10-CM

## 2022-04-06 DIAGNOSIS — Z96643 Presence of artificial hip joint, bilateral: Secondary | ICD-10-CM | POA: Diagnosis present

## 2022-04-06 DIAGNOSIS — S73004A Unspecified dislocation of right hip, initial encounter: Principal | ICD-10-CM

## 2022-04-06 DIAGNOSIS — Z888 Allergy status to other drugs, medicaments and biological substances status: Secondary | ICD-10-CM | POA: Diagnosis not present

## 2022-04-06 DIAGNOSIS — H9190 Unspecified hearing loss, unspecified ear: Secondary | ICD-10-CM | POA: Diagnosis present

## 2022-04-06 DIAGNOSIS — Z803 Family history of malignant neoplasm of breast: Secondary | ICD-10-CM | POA: Diagnosis not present

## 2022-04-06 DIAGNOSIS — M24451 Recurrent dislocation, right hip: Secondary | ICD-10-CM | POA: Diagnosis not present

## 2022-04-06 DIAGNOSIS — I10 Essential (primary) hypertension: Secondary | ICD-10-CM | POA: Diagnosis present

## 2022-04-06 DIAGNOSIS — E785 Hyperlipidemia, unspecified: Secondary | ICD-10-CM | POA: Diagnosis present

## 2022-04-06 DIAGNOSIS — Z8261 Family history of arthritis: Secondary | ICD-10-CM | POA: Diagnosis not present

## 2022-04-06 DIAGNOSIS — M109 Gout, unspecified: Secondary | ICD-10-CM | POA: Diagnosis present

## 2022-04-06 DIAGNOSIS — N183 Chronic kidney disease, stage 3 unspecified: Secondary | ICD-10-CM | POA: Diagnosis present

## 2022-04-06 DIAGNOSIS — Z90710 Acquired absence of both cervix and uterus: Secondary | ICD-10-CM | POA: Diagnosis not present

## 2022-04-06 DIAGNOSIS — Z9842 Cataract extraction status, left eye: Secondary | ICD-10-CM

## 2022-04-06 DIAGNOSIS — M069 Rheumatoid arthritis, unspecified: Secondary | ICD-10-CM | POA: Diagnosis present

## 2022-04-06 DIAGNOSIS — Z8249 Family history of ischemic heart disease and other diseases of the circulatory system: Secondary | ICD-10-CM

## 2022-04-06 DIAGNOSIS — T84020A Dislocation of internal right hip prosthesis, initial encounter: Principal | ICD-10-CM | POA: Diagnosis present

## 2022-04-06 DIAGNOSIS — Z923 Personal history of irradiation: Secondary | ICD-10-CM | POA: Diagnosis not present

## 2022-04-06 DIAGNOSIS — G20 Parkinson's disease: Secondary | ICD-10-CM | POA: Diagnosis present

## 2022-04-06 DIAGNOSIS — F32A Depression, unspecified: Secondary | ICD-10-CM | POA: Diagnosis present

## 2022-04-06 DIAGNOSIS — K219 Gastro-esophageal reflux disease without esophagitis: Secondary | ICD-10-CM | POA: Diagnosis present

## 2022-04-06 DIAGNOSIS — Z9841 Cataract extraction status, right eye: Secondary | ICD-10-CM | POA: Diagnosis not present

## 2022-04-06 DIAGNOSIS — G4730 Sleep apnea, unspecified: Secondary | ICD-10-CM | POA: Diagnosis present

## 2022-04-06 DIAGNOSIS — Z96653 Presence of artificial knee joint, bilateral: Secondary | ICD-10-CM | POA: Diagnosis present

## 2022-04-06 DIAGNOSIS — Y792 Prosthetic and other implants, materials and accessory orthopedic devices associated with adverse incidents: Secondary | ICD-10-CM | POA: Diagnosis present

## 2022-04-06 DIAGNOSIS — I129 Hypertensive chronic kidney disease with stage 1 through stage 4 chronic kidney disease, or unspecified chronic kidney disease: Secondary | ICD-10-CM | POA: Diagnosis present

## 2022-04-06 DIAGNOSIS — F419 Anxiety disorder, unspecified: Secondary | ICD-10-CM | POA: Diagnosis present

## 2022-04-06 DIAGNOSIS — M79671 Pain in right foot: Secondary | ICD-10-CM | POA: Diagnosis present

## 2022-04-06 DIAGNOSIS — T84060A Wear of articular bearing surface of internal prosthetic right hip joint, initial encounter: Secondary | ICD-10-CM | POA: Diagnosis present

## 2022-04-06 DIAGNOSIS — M159 Polyosteoarthritis, unspecified: Secondary | ICD-10-CM | POA: Diagnosis present

## 2022-04-06 DIAGNOSIS — M62838 Other muscle spasm: Secondary | ICD-10-CM | POA: Diagnosis present

## 2022-04-06 DIAGNOSIS — R0902 Hypoxemia: Secondary | ICD-10-CM | POA: Diagnosis present

## 2022-04-06 LAB — CBC WITH DIFFERENTIAL/PLATELET
Abs Immature Granulocytes: 0.04 10*3/uL (ref 0.00–0.07)
Basophils Absolute: 0 10*3/uL (ref 0.0–0.1)
Basophils Relative: 1 %
Eosinophils Absolute: 0.2 10*3/uL (ref 0.0–0.5)
Eosinophils Relative: 3 %
HCT: 37.1 % (ref 36.0–46.0)
Hemoglobin: 12.1 g/dL (ref 12.0–15.0)
Immature Granulocytes: 1 %
Lymphocytes Relative: 22 %
Lymphs Abs: 1.5 10*3/uL (ref 0.7–4.0)
MCH: 33.1 pg (ref 26.0–34.0)
MCHC: 32.6 g/dL (ref 30.0–36.0)
MCV: 101.4 fL — ABNORMAL HIGH (ref 80.0–100.0)
Monocytes Absolute: 0.4 10*3/uL (ref 0.1–1.0)
Monocytes Relative: 6 %
Neutro Abs: 4.6 10*3/uL (ref 1.7–7.7)
Neutrophils Relative %: 67 %
Platelets: 195 10*3/uL (ref 150–400)
RBC: 3.66 MIL/uL — ABNORMAL LOW (ref 3.87–5.11)
RDW: 14.1 % (ref 11.5–15.5)
WBC: 6.9 10*3/uL (ref 4.0–10.5)
nRBC: 0 % (ref 0.0–0.2)

## 2022-04-06 LAB — BASIC METABOLIC PANEL
Anion gap: 6 (ref 5–15)
BUN: 42 mg/dL — ABNORMAL HIGH (ref 8–23)
CO2: 28 mmol/L (ref 22–32)
Calcium: 9.4 mg/dL (ref 8.9–10.3)
Chloride: 106 mmol/L (ref 98–111)
Creatinine, Ser: 0.98 mg/dL (ref 0.44–1.00)
GFR, Estimated: 57 mL/min — ABNORMAL LOW (ref >60)
Glucose, Bld: 106 mg/dL — ABNORMAL HIGH (ref 70–99)
Potassium: 4.5 mmol/L (ref 3.5–5.1)
Sodium: 140 mmol/L (ref 135–145)

## 2022-04-06 LAB — APTT: aPTT: 42 seconds — ABNORMAL HIGH (ref 24–36)

## 2022-04-06 LAB — PROTIME-INR
INR: 1.1 (ref 0.8–1.2)
Prothrombin Time: 14.1 seconds (ref 11.4–15.2)

## 2022-04-06 MED ORDER — MORPHINE SULFATE (PF) 2 MG/ML IV SOLN
1.0000 mg | INTRAVENOUS | Status: DC | PRN
  Administered 2022-04-06 – 2022-04-08 (×4): 1 mg via INTRAVENOUS
  Filled 2022-04-06 (×4): qty 1

## 2022-04-06 MED ORDER — OXYCODONE-ACETAMINOPHEN 5-325 MG PO TABS
1.0000 | ORAL_TABLET | ORAL | Status: DC | PRN
  Administered 2022-04-06 – 2022-04-10 (×9): 1 via ORAL
  Filled 2022-04-06 (×9): qty 1

## 2022-04-06 MED ORDER — PANTOPRAZOLE SODIUM 40 MG PO TBEC
40.0000 mg | DELAYED_RELEASE_TABLET | Freq: Two times a day (BID) | ORAL | Status: DC
  Administered 2022-04-07 – 2022-04-12 (×10): 40 mg via ORAL
  Filled 2022-04-06 (×10): qty 1

## 2022-04-06 MED ORDER — FENTANYL CITRATE PF 50 MCG/ML IJ SOSY
50.0000 ug | PREFILLED_SYRINGE | Freq: Once | INTRAMUSCULAR | Status: AC
  Administered 2022-04-06: 50 ug via INTRAVENOUS
  Filled 2022-04-06: qty 1

## 2022-04-06 MED ORDER — FUROSEMIDE 20 MG PO TABS
30.0000 mg | ORAL_TABLET | Freq: Every day | ORAL | Status: DC
  Administered 2022-04-06 – 2022-04-07 (×2): 30 mg via ORAL
  Filled 2022-04-06: qty 1.5
  Filled 2022-04-06 (×2): qty 2

## 2022-04-06 MED ORDER — FESOTERODINE FUMARATE ER 4 MG PO TB24
4.0000 mg | ORAL_TABLET | Freq: Every day | ORAL | Status: DC
  Administered 2022-04-07 – 2022-04-12 (×5): 4 mg via ORAL
  Filled 2022-04-06 (×6): qty 1

## 2022-04-06 MED ORDER — LATANOPROST 0.005 % OP SOLN
1.0000 [drp] | Freq: Every evening | OPHTHALMIC | Status: DC
  Administered 2022-04-06 – 2022-04-11 (×6): 1 [drp] via OPHTHALMIC
  Filled 2022-04-06 (×2): qty 2.5

## 2022-04-06 MED ORDER — SERTRALINE HCL 50 MG PO TABS
100.0000 mg | ORAL_TABLET | Freq: Every day | ORAL | Status: DC
  Administered 2022-04-07 – 2022-04-12 (×5): 100 mg via ORAL
  Filled 2022-04-06 (×5): qty 2

## 2022-04-06 MED ORDER — GABAPENTIN 100 MG PO CAPS
200.0000 mg | ORAL_CAPSULE | Freq: Two times a day (BID) | ORAL | Status: DC
  Administered 2022-04-06 – 2022-04-11 (×9): 200 mg via ORAL
  Filled 2022-04-06 (×9): qty 2

## 2022-04-06 MED ORDER — HEPARIN SODIUM (PORCINE) 5000 UNIT/ML IJ SOLN
5000.0000 [IU] | Freq: Three times a day (TID) | INTRAMUSCULAR | Status: DC
  Administered 2022-04-06 – 2022-04-07 (×5): 5000 [IU] via SUBCUTANEOUS
  Filled 2022-04-06 (×6): qty 1

## 2022-04-06 MED ORDER — LOSARTAN POTASSIUM 50 MG PO TABS
100.0000 mg | ORAL_TABLET | Freq: Every day | ORAL | Status: DC
  Administered 2022-04-06 – 2022-04-07 (×2): 100 mg via ORAL
  Filled 2022-04-06 (×2): qty 2

## 2022-04-06 MED ORDER — HYDROMORPHONE HCL 1 MG/ML IJ SOLN
0.5000 mg | Freq: Once | INTRAMUSCULAR | Status: AC
  Administered 2022-04-06: 0.5 mg via INTRAVENOUS
  Filled 2022-04-06: qty 0.5

## 2022-04-06 MED ORDER — OYSTER SHELL CALCIUM/D3 500-5 MG-MCG PO TABS
1.0000 | ORAL_TABLET | Freq: Every day | ORAL | Status: DC
  Administered 2022-04-07 – 2022-04-12 (×5): 1 via ORAL
  Filled 2022-04-06 (×5): qty 1

## 2022-04-06 MED ORDER — ONDANSETRON HCL 4 MG/2ML IJ SOLN
4.0000 mg | Freq: Three times a day (TID) | INTRAMUSCULAR | Status: DC | PRN
Start: 2022-04-06 — End: 2022-04-12

## 2022-04-06 MED ORDER — CARBIDOPA-LEVODOPA ER 50-200 MG PO TBCR
1.0000 | EXTENDED_RELEASE_TABLET | Freq: Two times a day (BID) | ORAL | Status: DC
  Administered 2022-04-06 – 2022-04-12 (×12): 1 via ORAL
  Filled 2022-04-06 (×12): qty 1

## 2022-04-06 MED ORDER — B COMPLEX-C PO TABS
1.0000 | ORAL_TABLET | Freq: Every day | ORAL | Status: DC
  Administered 2022-04-07 – 2022-04-12 (×5): 1 via ORAL
  Filled 2022-04-06 (×6): qty 1

## 2022-04-06 MED ORDER — METHOCARBAMOL 500 MG PO TABS
500.0000 mg | ORAL_TABLET | Freq: Three times a day (TID) | ORAL | Status: DC | PRN
Start: 2022-04-06 — End: 2022-04-12
  Administered 2022-04-06 – 2022-04-09 (×7): 500 mg via ORAL
  Filled 2022-04-06 (×6): qty 1

## 2022-04-06 MED ORDER — ALLOPURINOL 100 MG PO TABS
200.0000 mg | ORAL_TABLET | Freq: Every day | ORAL | Status: DC
  Administered 2022-04-06 – 2022-04-11 (×6): 200 mg via ORAL
  Filled 2022-04-06 (×6): qty 2

## 2022-04-06 MED ORDER — VITAMIN D 25 MCG (1000 UNIT) PO TABS
2000.0000 [IU] | ORAL_TABLET | Freq: Every day | ORAL | Status: DC
  Administered 2022-04-07 – 2022-04-12 (×5): 2000 [IU] via ORAL
  Filled 2022-04-06 (×5): qty 2

## 2022-04-06 MED ORDER — HYDRALAZINE HCL 20 MG/ML IJ SOLN: 5.0000 mg | INTRAMUSCULAR | Status: DC | PRN

## 2022-04-06 MED ORDER — ACETAMINOPHEN 325 MG PO TABS
650.0000 mg | ORAL_TABLET | Freq: Four times a day (QID) | ORAL | Status: DC | PRN
  Administered 2022-04-08 – 2022-04-09 (×2): 650 mg via ORAL
  Filled 2022-04-06 (×2): qty 2

## 2022-04-06 MED ORDER — MIRABEGRON ER 25 MG PO TB24
25.0000 mg | ORAL_TABLET | Freq: Every day | ORAL | Status: DC
  Administered 2022-04-07 – 2022-04-12 (×5): 25 mg via ORAL
  Filled 2022-04-06 (×8): qty 1

## 2022-04-06 NOTE — Assessment & Plan Note (Signed)
-   Continue home medications 

## 2022-04-06 NOTE — Assessment & Plan Note (Signed)
As evidenced by x-ray. Patient has moderate pain now. No neurovascular compromise. Orthopedic surgeon, Dr. Rowland Lathe was consulted. Planing to do surgical reduction on 6/22  - will admit to Med-surg bed - Pain control: morphine prn and percocet - When necessary Zofran for nausea - Robaxin for muscle spasm - INR/PTT - PT/OT when able to (not ordered now)

## 2022-04-06 NOTE — Consult Note (Signed)
Reason for Consult: Recurrent right hip dislocation Referring Physician: Dr. Shanna Cisco Yvonne Singleton is an 87 y.o. female.  HPI: Patient is a pleasant 87 year old in significant pain secondary to a right dislocated hip.  She has had bilateral total hip replacements with multiple dislocations on the left most recently reduced in January of this year.  Regarding her right hip she had multiple dislocations and had a revision back in 2010 with a constrained liner.  It dislocated today with the ring noted to be out.  He normally walks with crutches is partly for because her hips and also from generalized osteoarthritis and rheumatoid arthritis.  She lives fairly independently at [facility]  Past Medical History:  Diagnosis Date   Back pain    Breast cancer ([place]) 2007   right breast lumpectomy with rad tx   Cancer [facility]) 2007   right breast   Collagen vascular disease ([place])    Gout    Hard of hearing    Hypertension    Parkinson's disease ([place])    Personal history of radiation therapy 2007   F/U right breast cancer    Past Surgical History:  Procedure Laterality Date   ABDOMINAL HYSTERECTOMY     BREAST LUMPECTOMY Right 2007   suspicious calcs, f/u with radiation   CATARACT EXTRACTION Bilateral    ESOPHAGOGASTRODUODENOSCOPY (EGD) WITH PROPOFOL N/A 05/25/2017   Procedure: ESOPHAGOGASTRODUODENOSCOPY (EGD) WITH PROPOFOL;  Surgeon: Lollie Sails, MD;  Location: [facility] ENDOSCOPY;  Service: Endoscopy;  Laterality: N/A;   ESOPHAGOGASTRODUODENOSCOPY (EGD) WITH PROPOFOL N/A 05/09/2018   Procedure: ESOPHAGOGASTRODUODENOSCOPY (EGD) WITH PROPOFOL;  Surgeon: Lin Landsman, MD;  Location: [facility] ENDOSCOPY;  Service: Gastroenterology;  Laterality: N/A;   ESOPHAGOGASTRODUODENOSCOPY (EGD) WITH PROPOFOL N/A 12/09/2021   Procedure: ESOPHAGOGASTRODUODENOSCOPY (EGD) WITH PROPOFOL;  Surgeon: Lin Landsman, MD;  Location: [place];  Service: Gastroenterology;  Laterality: N/A;   EYE  SURGERY Bilateral    HAND SURGERY     HIP CLOSED REDUCTION Left 06/24/2015   Procedure: CLOSED REDUCTION HIP;  Surgeon: Dereck Leep, MD;  Location: [facility] ORS;  Service: Orthopedics;  Laterality: Left;   HIP CLOSED REDUCTION Left 02/09/2016   Procedure: CLOSED MANIPULATION HIP;  Surgeon: Earnestine Leys, MD;  Location: [facility] ORS;  Service: Orthopedics;  Laterality: Left;   HIP CLOSED REDUCTION Left 10/31/2021   Procedure: CLOSED REDUCTION HIP;  Surgeon: Corky Mull, MD;  Location: [facility] ORS;  Service: Orthopedics;  Laterality: Left;   HIP SURGERY     JOINT REPLACEMENT     bilateral hip   OOPHORECTOMY     TOTAL KNEE ARTHROPLASTY Bilateral     Family History  Problem Relation Age of Onset   Heart disease Mother    Arthritis Mother    Heart disease Father    Ulcers Father    Breast cancer Maternal Aunt     Social History:  reports that she has never smoked. She has never used smokeless tobacco. She reports that she does not drink alcohol and does not use drugs.  Allergies:  Allergies  Allergen Reactions   Trospium Nausea And Vomiting   Digoxin     Other reaction(s): Unknown Note: pt had lethargy, felt bad Note: pt had lethargy, felt bad    Iodinated Contrast Media Hives   Streptomycin Hives    Medications: I have reviewed the patient's current medications.  Results for orders placed or performed during the hospital encounter of 04/06/22 (from the past 48 hour(s))  CBC with Differential  Status: Abnormal   Collection Time: 04/06/22 12:28 PM  Result Value Ref Range   WBC 6.9 4.0 - 10.5 K/uL   RBC 3.66 (L) 3.87 - 5.11 MIL/uL   Hemoglobin 12.1 12.0 - 15.0 g/dL   HCT 37.1 36.0 - 46.0 %   MCV 101.4 (H) 80.0 - 100.0 fL   MCH 33.1 26.0 - 34.0 pg   MCHC 32.6 30.0 - 36.0 g/dL   RDW 14.1 11.5 - 15.5 %   Platelets 195 150 - 400 K/uL   nRBC 0.0 0.0 - 0.2 %   Neutrophils Relative % 67 %   Neutro Abs 4.6 1.7 - 7.7 K/uL   Lymphocytes Relative 22 %   Lymphs Abs 1.5 0.7 - 4.0 K/uL    Monocytes Relative 6 %   Monocytes Absolute 0.4 0.1 - 1.0 K/uL   Eosinophils Relative 3 %   Eosinophils Absolute 0.2 0.0 - 0.5 K/uL   Basophils Relative 1 %   Basophils Absolute 0.0 0.0 - 0.1 K/uL   Immature Granulocytes 1 %   Abs Immature Granulocytes 0.04 0.00 - 0.07 K/uL    Comment: Performed at [facility], [address] [place], [place] [address]  Basic metabolic panel     Status: Abnormal   Collection Time: 04/06/22 12:28 PM  Result Value Ref Range   Sodium 140 135 - 145 mmol/L   Potassium 4.5 3.5 - 5.1 mmol/L   Chloride 106 98 - 111 mmol/L   CO2 28 22 - 32 mmol/L   Glucose, Bld 106 (H) 70 - 99 mg/dL    Comment: Glucose reference range applies only to samples taken after fasting for at least 8 hours.   BUN 42 (H) 8 - 23 mg/dL   Creatinine, Ser 0.98 0.44 - 1.00 mg/dL   Calcium 9.4 8.9 - 10.3 mg/dL   GFR, Estimated 57 (L) >60 mL/min    Comment: (NOTE) Calculated using the CKD-EPI Creatinine Equation (2021)    Anion gap 6 5 - 15    Comment: Performed at [facility], [address] [place],  [address]    DG Hip Malvin Johns or [place] Pelvis 2-3 Views Right  Result Date: 04/06/2022 CLINICAL DATA:  Hip pain EXAM: DG HIP (WITH OR WITHOUT PELVIS) 2-3V RIGHT COMPARISON:  Hip radiograph dated October 31, 2021 FINDINGS: Prior bilateral hip replacements with superolateral right hip dislocation. No evidence of acute osseous fracture. Osseous irregularity of the right lesser trochanter is similar when compared with prior radiograph. Severe degenerative disc disease of the lower lumbar spine. Soft tissues are unremarkable. IMPRESSION: Superolateral right hip dislocation. Bilateral total hip arthroplasties with no evidence of hardware fracture or acute osseous fracture. Electronically Signed   By: Yetta Glassman M.D.   On: 04/06/2022 11:56   DG Chest Portable 1 View  Result Date: 04/06/2022 CLINICAL DATA:  Right hip injury. EXAM: PORTABLE CHEST 1 VIEW COMPARISON:   11/18/2020 FINDINGS: The cardio pericardial silhouette is enlarged. Large hiatal hernia again noted. Interstitial markings are diffusely coarsened with chronic features. Basilar atelectasis noted bilaterally. Telemetry leads overlie the chest. IMPRESSION: 1. Stable exam. No acute cardiopulmonary findings. 2. Large hiatal hernia. Electronically Signed   By: Misty Stanley M.D.   On: 04/06/2022 11:54    Review of Systems Blood pressure (!) 149/72, pulse (!) 58, temperature 97.6 F (36.4 C), temperature source Oral, resp. rate 19, height 5' 3"  (1.6 m), weight 75.8 kg, SpO2 98 %. Physical Exam She is able flex extend her toes on  the right foot with the leg internally rotated and shortened.  She has a palpable pulse.  Thigh has intact skin with internally rotated leg and tenderness to palpation around the hip.  Posterior prior scar. Assessment/Plan: Recurrent dislocation in a patient with multiple prior surgeries.  Dr. Marry Guan is her normal surgeon but he is on vacation and will be all week so would was asked to see her.  My recommendation is for revision with a anterior approach using a dual mobility head.  That will be with Medacta cup and liner and DePuy head that should match.  I discussed with her that based there are risks of infection and blood clot but bigger risks related to her general health including stroke or MI.  She understands this and hopes to have this resolve her problem.  Surgery scheduled for 6/22 to allow [place] to get special equipment.  Hessie Knows 04/06/2022, 3:15 PM

## 2022-04-06 NOTE — Assessment & Plan Note (Signed)
-   Sinemet

## 2022-04-06 NOTE — Assessment & Plan Note (Signed)
-   IV hydralazine as needed -Lasix, Cozaar

## 2022-04-06 NOTE — ED Notes (Signed)
ED TO INPATIENT HANDOFF REPORT  ED Nurse Name and Phone #: Baxter Flattery, RN  S Name/Age/Gender Yvonne Singleton 87 y.o. female Room/Bed: ED11A/ED11A  Code Status   Code Status: Full Code  Home/SNF/Other [facility] Patient oriented to: self, place, time, and situation Is this baseline? Yes   Triage Complete: Triage complete  Chief Complaint Recurrent dislocation, right hip [M24.451]  Triage Note [facility] coming from [facility] Pt was taking off her compression sock and heard and felt her right hip pop. Pt has history of hip dislocation. EMS gave 24mg of Fentanyl.   Allergies Allergies  Allergen Reactions   Trospium Nausea And Vomiting   Digoxin     Other reaction(s): Unknown Note: pt had lethargy, felt bad Note: pt had lethargy, felt bad    Iodinated Contrast Media Hives   Streptomycin Hives    Level of Care/Admitting Diagnosis ED Disposition     ED Disposition  Admit   Condition  --   [facility][100120]  Level of Care: Med-Surg [16]  Covid Evaluation: Asymptomatic - no recent exposure (last 10 days) testing not required  Diagnosis: Recurrent dislocation, right hip [[[id number]] Admitting Physician: NIvor Costa[[id number]]  Attending Physician: NIvor Costa[[id number]]          B Medical/Surgery History Past Medical History:  Diagnosis Date   Back pain    [place]   right breast lumpectomy with rad tx   [place]   [place]   [place]    Gout    Hard of hearing    Hypertension    Parkinson's disease ([place]    Personal history of radiation therapy 2007   F/U right breast cancer   Past Surgical History:  Procedure Laterality Date   ABDOMINAL HYSTERECTOMY     BREAST LUMPECTOMY Right 2007   suspicious calcs, f/u with radiation   CATARACT EXTRACTION Bilateral    ESOPHAGOGASTRODUODENOSCOPY (EGD) WITH PROPOFOL N/A 05/25/2017   Procedure: ESOPHAGOGASTRODUODENOSCOPY  (EGD) WITH PROPOFOL;  Surgeon: SLollie Sails MD;  [facility];  Service: Endoscopy;  Laterality: N/A;   ESOPHAGOGASTRODUODENOSCOPY (EGD) WITH PROPOFOL N/A 05/09/2018   Procedure: ESOPHAGOGASTRODUODENOSCOPY (EGD) WITH PROPOFOL;  Surgeon: VLin Landsman MD;  [facility];  Service: Gastroenterology;  Laterality: N/A;   ESOPHAGOGASTRODUODENOSCOPY (EGD) WITH PROPOFOL N/A 12/09/2021   Procedure: ESOPHAGOGASTRODUODENOSCOPY (EGD) WITH PROPOFOL;  Surgeon: VLin Landsman MD;  [facility]  Service: Gastroenterology;  Laterality: N/A;   EYE SURGERY Bilateral    HAND SURGERY     HIP CLOSED REDUCTION Left 06/24/2015   Procedure: CLOSED REDUCTION HIP;  Surgeon: JDereck Leep MD;  Location: [facility] ORS;  Service: Orthopedics;  Laterality: Left;   HIP CLOSED REDUCTION Left 02/09/2016   Procedure: CLOSED MANIPULATION HIP;  Surgeon: HEarnestine Leys MD;  Location: [facility] ORS;  Service: Orthopedics;  Laterality: Left;   HIP CLOSED REDUCTION Left 10/31/2021   Procedure: CLOSED REDUCTION HIP;  Surgeon: PCorky Mull MD;  Location: [facility] ORS;  Service: Orthopedics;  Laterality: Left;   HIP SURGERY     JOINT REPLACEMENT     bilateral hip   OOPHORECTOMY     TOTAL KNEE ARTHROPLASTY Bilateral      A IV Location/Drains/Wounds Patient Lines/Drains/Airways Status     Active Line/Drains/Airways     Name Placement date Placement time Site Days   Peripheral IV 04/06/22 20 G 1" Left Antecubital 04/06/22  --  Antecubital  less than 1   Incision (Closed) 10/31/21 Knee Left 10/31/21  1548  -- 157            Intake/Output Last 24 hours No intake or output data in the 24 hours ending 04/06/22 1422  Labs/Imaging Results for orders placed or performed during the hospital encounter of 04/06/22 (from the past 48 hour(s))  CBC with Differential     Status: Abnormal   Collection Time: 04/06/22 12:28 PM  Result Value Ref Range   WBC 6.9 4.0 - 10.5 K/uL   RBC 3.66 (L)  3.87 - 5.11 MIL/uL   Hemoglobin 12.1 12.0 - 15.0 g/dL   HCT 37.1 36.0 - 46.0 %   MCV 101.4 (H) 80.0 - 100.0 fL   MCH 33.1 26.0 - 34.0 pg   MCHC 32.6 30.0 - 36.0 g/dL   RDW 14.1 11.5 - 15.5 %   Platelets 195 150 - 400 K/uL   nRBC 0.0 0.0 - 0.2 %   Neutrophils Relative % 67 %   Neutro Abs 4.6 1.7 - 7.7 K/uL   Lymphocytes Relative 22 %   Lymphs Abs 1.5 0.7 - 4.0 K/uL   Monocytes Relative 6 %   Monocytes Absolute 0.4 0.1 - 1.0 K/uL   Eosinophils Relative 3 %   Eosinophils Absolute 0.2 0.0 - 0.5 K/uL   Basophils Relative 1 %   Basophils Absolute 0.0 0.0 - 0.1 K/uL   Immature Granulocytes 1 %   Abs Immature Granulocytes 0.04 0.00 - 0.07 K/uL    Comment: Performed at [facility], [address] [place], [place] [address]  Basic metabolic panel     Status: Abnormal   Collection Time: 04/06/22 12:28 PM  Result Value Ref Range   Sodium 140 135 - 145 mmol/L   Potassium 4.5 3.5 - 5.1 mmol/L   Chloride 106 98 - 111 mmol/L   CO2 28 22 - 32 mmol/L   Glucose, Bld 106 (H) 70 - 99 mg/dL    Comment: Glucose reference range applies only to samples taken after fasting for at least 8 hours.   BUN 42 (H) 8 - 23 mg/dL   Creatinine, Ser 0.98 0.44 - 1.00 mg/dL   Calcium 9.4 8.9 - 10.3 mg/dL   GFR, Estimated 57 (L) >60 mL/min    Comment: (NOTE) Calculated using the CKD-EPI Creatinine Equation (2021)    Anion gap 6 5 - 15    Comment: Performed at [facility], [address] [place], [place] [address]   DG Hip Malvin Johns or [place] Pelvis 2-3 Views Right  Result Date: 04/06/2022 CLINICAL DATA:  Hip pain EXAM: DG HIP (WITH OR WITHOUT PELVIS) 2-3V RIGHT COMPARISON:  Hip radiograph dated October 31, 2021 FINDINGS: Prior bilateral hip replacements with superolateral right hip dislocation. No evidence of acute osseous fracture. Osseous irregularity of the right lesser trochanter is similar when compared with prior radiograph. Severe degenerative disc disease of the lower lumbar spine. Soft  tissues are unremarkable. IMPRESSION: Superolateral right hip dislocation. Bilateral total hip arthroplasties with no evidence of hardware fracture or acute osseous fracture. Electronically Signed   By: Yetta Glassman M.D.   On: 04/06/2022 11:56   DG Chest Portable 1 View  Result Date: 04/06/2022 CLINICAL DATA:  Right hip injury. EXAM: PORTABLE CHEST 1 VIEW COMPARISON:  11/18/2020 FINDINGS: The cardio pericardial silhouette is enlarged. Large hiatal hernia again noted. Interstitial markings are diffusely coarsened with chronic features. Basilar atelectasis noted bilaterally. Telemetry leads overlie the chest. IMPRESSION: 1.  Stable exam. No acute cardiopulmonary findings. 2. Large hiatal hernia. Electronically Signed   By: Misty Stanley M.D.   On: 04/06/2022 11:54    Pending Labs Unresulted Labs (From admission, onward)     Start     Ordered   04/06/22 1249  Protime-INR  Add-on,   AD        04/06/22 1248   04/06/22 1249  APTT  Add-on,   AD        04/06/22 1248            Vitals/Pain Today's Vitals   04/06/22 1104 04/06/22 1204 04/06/22 1229 04/06/22 1402  BP: (!) 141/90 (!) 145/63    Pulse: 63 (!) 56    Resp: 18 16    Temp: 97.6 F (36.4 C)     TempSrc: Oral     SpO2: 100% 97%    Weight:      Height:      PainSc:   (S) 6  3     Isolation Precautions No active isolations  Medications Medications  oxyCODONE-acetaminophen (PERCOCET/ROXICET) 5-325 MG per tablet 1 tablet (has no administration in time range)  acetaminophen (TYLENOL) tablet 650 mg (has no administration in time range)  ondansetron (ZOFRAN) injection 4 mg (has no administration in time range)  hydrALAZINE (APRESOLINE) injection 5 mg (has no administration in time range)  morphine (PF) 2 MG/ML injection 1 mg (has no administration in time range)  methocarbamol (ROBAXIN) tablet 500 mg (has no administration in time range)  heparin injection 5,000 Units (5,000 Units Subcutaneous Given 04/06/22 1309)  fentaNYL  (SUBLIMAZE) injection 50 mcg (50 mcg Intravenous Given 04/06/22 1203)  HYDROmorphone (DILAUDID) injection 0.5 mg (0.5 mg Intravenous Given 04/06/22 1301)    Mobility walks Moderate fall risk   Focused Assessments Cardiac Assessment Handoff:    Lab Results  Component Value Date   TROPONINI <0.03 10/23/2018   No results found for: "DDIMER" Does the Patient currently have chest pain? No    R Recommendations: See Admitting Provider Note  Report given to:   Additional Notes:

## 2022-04-06 NOTE — Assessment & Plan Note (Signed)
Allopurinol  

## 2022-04-06 NOTE — ED Triage Notes (Signed)
[facility] coming from [facility]. Pt was taking off her compression sock and heard and felt her right hip pop. Pt has history of hip dislocation. EMS gave 74mg of Fentanyl.

## 2022-04-06 NOTE — H&P (Signed)
History and Physical    Yvonne Singleton IRW:[id number] DOB: 1935-04-29 DOA: 04/06/2022  Referring MD/NP/PA:   PCP: Perrin Maltese, MD   Patient coming from:  The patient is coming from independent living facility   Chief Complaint: right hip pain  HPI: Yvonne Singleton is a 87 y.o. female with medical history significant of multiple hip dislocations in the past, hypertension, hyperlipidemia, GERD, gout, depression, Parkinson's disease, hard of hearing, breast cancer (s/p of right breast radiation therapy), rheumatoid arthritis, C. difficile colitis, GI bleeding, who presents with right hip pain.  Pt states that she twisted her right hip and felt a pop when was trying to take her compression socks off today.  She developed right heel pain, which is constant, sharp, moderate, nonradiating.  No fall or any injury.  Patient does not have chest pain, cough, shortness of breath.  No nausea, vomiting, diarrhea or abdominal pain.  No symptoms of UTI.  Data Reviewed and ED Course: pt was found to have WBC 6.9, creatinine 0.98, BUN 28, temperature normal, blood pressure 145/63, heart rate 56, RR 18, oxygen saturation 97% on room air.  X-ray of right hip showed dislocation.  Patient is admitted to [facility] bed as inpatient. Dr. Rowland Lathe of ortho is consulted.  X-ray of right hip: Superolateral right hip dislocation. Bilateral total hip arthroplasties with no evidence of hardware fracture or acute osseous fracture.  EKG: I have personally reviewed.  Sinus rhythm, QTc 443, low voltage, anteroseptal infarction pattern, PAC, RAD.   Review of Systems:   General: no fevers, chills, no body weight gain, has fatigue HEENT: no blurry vision, hearing changes or sore throat Respiratory: no dyspnea, coughing, wheezing CV: no chest pain, no palpitations GI: no nausea, vomiting, abdominal pain, diarrhea, constipation GU: no dysuria, burning on urination, increased urinary frequency, hematuria  Ext:  no leg edema Neuro: no unilateral weakness, numbness, or tingling, no vision change or hearing loss Skin: no rash, no skin tear. MSK: has right hip pain Heme: No easy bruising.  Travel history: No recent long distant travel.   Allergy:  Allergies  Allergen Reactions   Trospium Nausea And Vomiting   Digoxin     Other reaction(s): Unknown Note: pt had lethargy, felt bad Note: pt had lethargy, felt bad    Iodinated Contrast Media Hives   Streptomycin Hives    Past Medical History:  Diagnosis Date   Back pain    Breast cancer ([place]) 2007   right breast lumpectomy with rad tx   Cancer ([place]) 2007   right breast   Collagen vascular disease ([place])    Gout    Hard of hearing    Hypertension    Parkinson's disease ([place])    Personal history of radiation therapy 2007   F/U right breast cancer    Past Surgical History:  Procedure Laterality Date   ABDOMINAL HYSTERECTOMY     BREAST LUMPECTOMY Right 2007   suspicious calcs, f/u with radiation   CATARACT EXTRACTION Bilateral    ESOPHAGOGASTRODUODENOSCOPY (EGD) WITH PROPOFOL N/A 05/25/2017   Procedure: ESOPHAGOGASTRODUODENOSCOPY (EGD) WITH PROPOFOL;  Surgeon: Lollie Sails, MD;  Location: [facility] ENDOSCOPY;  Service: Endoscopy;  Laterality: N/A;   ESOPHAGOGASTRODUODENOSCOPY (EGD) WITH PROPOFOL N/A 05/09/2018   Procedure: ESOPHAGOGASTRODUODENOSCOPY (EGD) WITH PROPOFOL;  Surgeon: Lin Landsman, MD;  Location: [facility] ENDOSCOPY;  Service: Gastroenterology;  Laterality: N/A;   ESOPHAGOGASTRODUODENOSCOPY (EGD) WITH PROPOFOL N/A 12/09/2021   Procedure: ESOPHAGOGASTRODUODENOSCOPY (EGD) WITH PROPOFOL;  Surgeon: Lin Landsman, MD;  Location:  [facility] ENDOSCOPY;  Service: Gastroenterology;  Laterality: N/A;   EYE SURGERY Bilateral    HAND SURGERY     HIP CLOSED REDUCTION Left 06/24/2015   Procedure: CLOSED REDUCTION HIP;  Surgeon: Dereck Leep, MD;  Location: [facility] ORS;  Service: Orthopedics;  Laterality: Left;   HIP CLOSED REDUCTION Left  02/09/2016   Procedure: CLOSED MANIPULATION HIP;  Surgeon: Earnestine Leys, MD;  Location: [facility] ORS;  Service: Orthopedics;  Laterality: Left;   HIP CLOSED REDUCTION Left 10/31/2021   Procedure: CLOSED REDUCTION HIP;  Surgeon: Corky Mull, MD;  Location: [facility] ORS;  Service: Orthopedics;  Laterality: Left;   HIP SURGERY     JOINT REPLACEMENT     bilateral hip   OOPHORECTOMY     TOTAL KNEE ARTHROPLASTY Bilateral     Social History:  reports that she has never smoked. She has never used smokeless tobacco. She reports that she does not drink alcohol and does not use drugs.  Family History:  Family History  Problem Relation Age of Onset   Heart disease Mother    Arthritis Mother    Heart disease Father    Ulcers Father    Breast cancer Maternal Aunt      Prior to Admission medications   Medication Sig Start Date End Date Taking? Authorizing Provider  allopurinol (ZYLOPRIM) 100 MG tablet Take 200 mg by mouth at bedtime.     [provider]  b complex vitamins tablet Take 1 tablet by mouth daily.    [provider]  Calcium Carbonate-Vitamin D 600-200 MG-UNIT TABS Take 1 tablet by mouth daily.    [provider]  carbidopa-levodopa (SINEMET CR) 50-200 MG per tablet Take 1 tablet by mouth at bedtime.     [provider]  cholecalciferol (VITAMIN D) 1000 units tablet Take 2,000 Units by mouth daily.    [provider]  DULoxetine (CYMBALTA) 30 MG capsule Take 1 capsule by mouth daily. 08/05/18   [provider]  furosemide (LASIX) 20 MG tablet Take 30 mg by mouth.    [provider]  gabapentin (NEURONTIN) 100 MG capsule Limit 1 capsule by mouth per day or every other day if tolerated Patient taking differently: Take 100 mg by mouth 2 (two) times daily. 06/17/16   Mohammed Kindle, MD  losartan (COZAAR) 100 MG tablet Take 100 mg by mouth daily.     [provider]  MYRBETRIQ 25 MG TB24 tablet Take 25 mg by mouth daily.  10/08/21   [provider]  oxyCODONE (OXY IR/ROXICODONE) 5 MG immediate release tablet Take 1 tablet by mouth 3 (three) times daily as needed. 07/28/18   [provider]  pantoprazole (PROTONIX) 40 MG tablet Take 1 tablet (40 mg total) by mouth 2 (two) times daily before a meal. 12/09/21 01/08/22  Vanga, Tally Due, MD  sertraline (ZOLOFT) 100 MG tablet Take 100 mg by mouth daily. 09/30/21   [provider]  tolterodine (DETROL LA) 4 MG 24 hr capsule Take 4 mg by mouth daily.    [provider]    Physical Exam: Vitals:   04/06/22 1300 04/06/22 1330 04/06/22 1400 04/06/22 1430  BP: (!) 159/121 (!) 151/66 137/65 (!) 149/72  Pulse: (!) 50 (!) 52 (!) 56 (!) 58  Resp: 14 15 19    Temp:      TempSrc:      SpO2: 96% 98% 97% 98%  Weight:      Height:       General:  Not in acute distress HEENT:       Eyes: PERRL, EOMI, no scleral icterus.       ENT: No discharge from the ears and nose, no pharynx injection, no tonsillar enlargement.        Neck: No JVD, no bruit, no mass felt. Heme: No neck lymph node enlargement. Cardiac: S1/S2, RRR, No gallops or rubs. Respiratory: No rales, wheezing, rhonchi or rubs. GI: Soft, nondistended, nontender, no rebound pain, no organomegaly, BS present. GU: No hematuria Ext: No pitting leg edema bilaterally. 1+DP/PT pulse bilaterally. Musculoskeletal: Has right hip tenderness. Skin: No rashes.  Neuro: Alert, oriented X3, cranial nerves II-XII grossly intact, moves all extremities. Psych: Patient is not psychotic, no suicidal or hemocidal ideation.  Labs on Admission: I have personally reviewed following labs and imaging studies  CBC: Recent Labs  Lab 04/06/22 1228  WBC 6.9  NEUTROABS 4.6  HGB 12.1  HCT 37.1  MCV 101.4*  PLT 818   Basic Metabolic Panel: Recent Labs  Lab 04/06/22 1228  NA 140  K 4.5  CL 106  CO2 28  GLUCOSE 106*  BUN 42*  CREATININE 0.98  CALCIUM 9.4   GFR: Estimated Creatinine  Clearance: 40.9 mL/min (by C-G formula based on SCr of 0.98 mg/dL). Liver Function Tests: No results for input(s): "AST", "ALT", "ALKPHOS", "BILITOT", "PROT", "ALBUMIN" in the last 168 hours. No results for input(s): "LIPASE", "AMYLASE" in the last 168 hours. No results for input(s): "AMMONIA" in the last 168 hours. Coagulation Profile: Recent Labs  Lab 04/06/22 1547  INR 1.1   Cardiac Enzymes: No results for input(s): "CKTOTAL", "CKMB", "CKMBINDEX", "TROPONINI" in the last 168 hours. BNP (last 3 results) No results for input(s): "PROBNP" in the last 8760 hours. HbA1C: No results for input(s): "HGBA1C" in the last 72 hours. CBG: No results for input(s): "GLUCAP" in the last 168 hours. Lipid Profile: No results for input(s): "CHOL", "HDL", "LDLCALC", "TRIG", "CHOLHDL", "LDLDIRECT" in the last 72 hours. Thyroid Function Tests: No results for input(s): "TSH", "T4TOTAL", "FREET4", "T3FREE", "THYROIDAB" in the last 72 hours. Anemia Panel: No results for input(s): "VITAMINB12", "FOLATE", "FERRITIN", "TIBC", "IRON", "RETICCTPCT" in the last 72 hours. Urine analysis:    Component Value Date/Time   COLORURINE YELLOW (A) 09/28/2016 1726   APPEARANCEUR CLEAR (A) 09/28/2016 1726   APPEARANCEUR Clear 09/30/2014 1508   LABSPEC 1.015 09/28/2016 1726   LABSPEC 1.006 09/30/2014 1508   PHURINE 5.0 09/28/2016 1726   GLUCOSEU NEGATIVE 09/28/2016 1726   GLUCOSEU Negative 09/30/2014 1508   HGBUR MODERATE (A) 09/28/2016 1726   BILIRUBINUR NEGATIVE 09/28/2016 1726   BILIRUBINUR Negative 09/30/2014 1508   KETONESUR NEGATIVE 09/28/2016 1726   PROTEINUR NEGATIVE 09/28/2016 1726   NITRITE NEGATIVE 09/28/2016 1726   LEUKOCYTESUR LARGE (A) 09/28/2016 1726   LEUKOCYTESUR 2+ 09/30/2014 1508   Sepsis Labs: @LABRCNTIP (procalcitonin:4,lacticidven:4) )No results found for this or any previous visit (from the past 240 hour(s)).   Radiological Exams on Admission: DG Hip Unilat W or Wo Pelvis 2-3 Views  Right  Result Date: 04/06/2022 CLINICAL DATA:  Hip pain EXAM: DG HIP (WITH OR WITHOUT PELVIS) 2-3V RIGHT COMPARISON:  Hip radiograph dated October 31, 2021 FINDINGS: Prior bilateral hip replacements with superolateral right hip dislocation. No evidence of acute osseous fracture. Osseous irregularity of the right lesser trochanter is similar when compared with prior radiograph. Severe degenerative disc disease of the lower lumbar spine. Soft tissues are unremarkable. IMPRESSION: Superolateral right hip dislocation. Bilateral total hip arthroplasties with no evidence of hardware fracture  or acute osseous fracture. Electronically Signed   By: Yetta Glassman M.D.   On: 04/06/2022 11:56   DG Chest Portable 1 View  Result Date: 04/06/2022 CLINICAL DATA:  Right hip injury. EXAM: PORTABLE CHEST 1 VIEW COMPARISON:  11/18/2020 FINDINGS: The cardio pericardial silhouette is enlarged. Large hiatal hernia again noted. Interstitial markings are diffusely coarsened with chronic features. Basilar atelectasis noted bilaterally. Telemetry leads overlie the chest. IMPRESSION: 1. Stable exam. No acute cardiopulmonary findings. 2. Large hiatal hernia. Electronically Signed   By: Misty Stanley M.D.   On: 04/06/2022 11:54      Assessment/Plan Principal Problem:   Recurrent dislocation, right hip Active Problems:   Parkinson disease ([place])   Hypertension   Gout   Depression  Assessment and Plan: * Recurrent dislocation, right hip  As evidenced by x-ray. Patient has moderate pain now. No neurovascular compromise. Orthopedic surgeon, Dr. Rowland Lathe was consulted. Planing to do surgical reduction on 6/22  - will admit to Med-surg bed - Pain control: morphine prn and percocet - When necessary Zofran for nausea - Robaxin for muscle spasm - INR/PTT - PT/OT when able to (not ordered now)   Parkinson disease ([place]) - Sinemet  Hypertension - IV hydralazine as needed -Lasix, Cozaar   Gout -  Allopurinol  Depression - Continue home medications             DVT ppx: SQ Heparin      Code Status: Full code  Family Communication: Yes, patient's daughter  by phone  Disposition Plan:  Anticipate discharge back to previous environment, independent living facility  Consults called:  Dr. Rowland Lathe of ortho is consulted.  Admission status and Level of care: Med-Surg:    as inpt          Severity of Illness:  The appropriate patient status for this patient is INPATIENT. Inpatient status is judged to be reasonable and necessary in order to provide the required intensity of service to ensure the patient's safety. The patient's presenting symptoms, physical exam findings, and initial radiographic and laboratory data in the context of their chronic comorbidities is felt to place them at high risk for further clinical deterioration. Furthermore, it is not anticipated that the patient will be medically stable for discharge from the hospital within 2 midnights of admission.   * I certify that at the point of admission it is my clinical judgment that the patient will require inpatient hospital care spanning beyond 2 midnights from the point of admission due to high intensity of service, high risk for further deterioration and high frequency of surveillance required.*       Date of Service 04/06/2022    Ivor Costa [facility]   If 7PM-7AM, please contact night-coverage [email or web address] 04/06/2022, 6:43 PM

## 2022-04-06 NOTE — ED Provider Notes (Signed)
[facility] Provider Note    Event Date/Time   First MD Initiated Contact with Patient 04/06/22 1100     (approximate)   History   Hip Pain   HPI  Sosha Shepherd is a 87 y.o. female with Parkinson's disease who comes in with concern for leg pain.  Patient is status post a left hip arthroplasty and has had prior dislocations in the past.  Patient reports acute onset of twisting and felt a pop similar to her prior dislocations when she was trying to take her compression socks off.  Did not fall onto the ground.  She reports having three pops in the past.  They attempted to reduce the dislocation in the ER but patient was hypoxic with fentanyl and Versed requiring supplemental oxygen and repositioning and they were unable to proceed due to pain. No blood thinners.    Physical Exam   Triage Vital Signs: ED Triage Vitals  Enc Vitals Group     BP --      Pulse --      Resp --      Temp --      Temp src --      SpO2 --      Weight 04/06/22 1100 167 lb (75.8 kg)     Height 04/06/22 1100 5' 3"  (1.6 m)     Head Circumference --      Peak Flow --      Pain Score 04/06/22 1059 (S) 10     Pain Loc --      Pain Edu? --      Excl. in [place]? --     Most recent vital signs: Vitals:   04/06/22 1104  BP: (!) 141/90  Pulse: 63  Resp: 18  Temp: 97.6 F (36.4 C)  SpO2: 100%     General: Awake, no distress.  CV:  Good peripheral perfusion.  Resp:  Normal effort.  Abd:  No distention.  Other:  R hip pain -sensation intact dorsi flex or plantar flex Distal pulse intact    ED Results / Procedures / Treatments   Labs (all labs ordered are listed, but only abnormal results are displayed) Labs Reviewed  CBC WITH DIFFERENTIAL/PLATELET - Abnormal; Notable for the following components:      Result Value   RBC 3.66 (*)    MCV 101.4 (*)    All other components within normal limits  BASIC METABOLIC PANEL  PROTIME-INR  APTT     EKG  My  interpretation of EKG:  Sinus rate of 62 without any ST elevation or T wave inversions, normal intervals, occasional PAC  RADIOLOGY I have reviewed the xray personally and interpreted and patient has a right hip dislocation   PROCEDURES:  Critical Care performed: No  Procedures   MEDICATIONS ORDERED IN ED: Medications  oxyCODONE-acetaminophen (PERCOCET/ROXICET) 5-325 MG per tablet 1 tablet (has no administration in time range)  acetaminophen (TYLENOL) tablet 650 mg (has no administration in time range)  ondansetron (ZOFRAN) injection 4 mg (has no administration in time range)  hydrALAZINE (APRESOLINE) injection 5 mg (has no administration in time range)  HYDROmorphone (DILAUDID) injection 0.5 mg (has no administration in time range)  morphine (PF) 2 MG/ML injection 1 mg (has no administration in time range)  methocarbamol (ROBAXIN) tablet 500 mg (has no administration in time range)  heparin injection 5,000 Units (has no administration in time range)  fentaNYL (SUBLIMAZE) injection 50 mcg (50 mcg Intravenous Given 04/06/22 1203)  IMPRESSION / MDM / ASSESSMENT AND PLAN / ED COURSE  I reviewed the triage vital signs and the nursing notes.   Patient's presentation is most consistent with acute presentation with potential threat to life or bodily function.   Differential includes fracture, dislocation.  Patient neurovascularly intact.  Patient given some IV fentanyl.  Discussed the case with orthopedic  11:55 AM  Discussed with Dr Roland Rack- he is not sure if we can reduce it he will discuss the case with Dr. Rudene Christians and let us know  I discussed again with Dr. Roland Rack-  It is very unlikely that patient would have successful reduction you consider partial reduction but patient's pain seems well controlled with the fentanyl and I discussed the different options with the patient and daughter and she would prefer to wait for the OR which I think is the safer option due to the risk for  sedation given she was hypoxic previously when she had a done and that there would be very low to no chance of it being successfully reduced  I reviewed patient's hospital admission from 10/31/2021 where she was admitted for dislocation of the left hip.  CBC reassuring.  BMP reviewed  I did discuss the hospital team for admission  FINAL CLINICAL IMPRESSION(S) / ED DIAGNOSES   Final diagnoses:  Hip dislocation, right, initial encounter [facility])     Rx / DC Orders   ED Discharge Orders     None        Note:  This document was prepared using Dragon voice recognition software and may include unintentional dictation errors.   Vanessa Olivet, MD 04/06/22 1257

## 2022-04-07 DIAGNOSIS — M24451 Recurrent dislocation, right hip: Secondary | ICD-10-CM | POA: Diagnosis not present

## 2022-04-07 LAB — TYPE AND SCREEN
ABO/RH(D): O POS
Antibody Screen: NEGATIVE

## 2022-04-07 LAB — SURGICAL PCR SCREEN
MRSA, PCR: NEGATIVE
Staphylococcus aureus: POSITIVE — AB

## 2022-04-07 MED ORDER — CHLORHEXIDINE GLUCONATE CLOTH 2 % EX PADS
6.0000 | MEDICATED_PAD | Freq: Every day | CUTANEOUS | Status: DC
  Administered 2022-04-07 – 2022-04-09 (×3): 6 via TOPICAL

## 2022-04-07 MED ORDER — ENSURE ENLIVE PO LIQD
237.0000 mL | Freq: Two times a day (BID) | ORAL | Status: DC
  Administered 2022-04-07 – 2022-04-12 (×7): 237 mL via ORAL

## 2022-04-07 MED ORDER — CEFAZOLIN SODIUM-DEXTROSE 2-4 GM/100ML-% IV SOLN
2.0000 g | Freq: Once | INTRAVENOUS | Status: AC
  Administered 2022-04-08: 2 g via INTRAVENOUS

## 2022-04-07 MED ORDER — MUPIROCIN 2 % EX OINT
1.0000 | TOPICAL_OINTMENT | Freq: Two times a day (BID) | CUTANEOUS | Status: AC
  Administered 2022-04-07 – 2022-04-11 (×10): 1 via NASAL
  Filled 2022-04-07: qty 22

## 2022-04-07 MED ORDER — CEFAZOLIN SODIUM-DEXTROSE 1-4 GM/50ML-% IV SOLN: 1.0000 g | Freq: Once | INTRAVENOUS | Status: DC

## 2022-04-07 NOTE — Plan of Care (Signed)

## 2022-04-07 NOTE — Progress Notes (Signed)
Patient seen right leg continues to be shortened and internally rotated.  She is to flex extend the toes and remains neurovascular intact. Discussed plans again with her for revision of acetabular component and femoral head in hopes of restoring stability to allow her to regain ambulatory status.  Surgery scheduled for tomorrow.  Risk benefits possible complications of been discussed.  Site is marked

## 2022-04-07 NOTE — Progress Notes (Signed)
PROGRESS NOTE    Yvonne Singleton  XBM:[id number] DOB: [birth year] DOA: 04/06/2022 PCP: Perrin Maltese, MD    Brief Narrative:  Yvonne Singleton is a 87 y.o. female with medical history significant of multiple hip dislocations in the past, hypertension, hyperlipidemia, GERD, gout, depression, Parkinson's disease, hard of hearing, breast cancer (s/p of right breast radiation therapy), rheumatoid arthritis, C. difficile colitis, GI bleeding, who presents with right hip pain.   Pt states that she twisted her right hip and felt a pop when was trying to take her compression socks off today.  She developed right heel pain, which is constant, sharp, moderate, nonradiating.  No fall or any injury.  Patient does not have chest pain, cough, shortness of breath.  No nausea, vomiting, diarrhea or abdominal pain.  No symptoms of UTI.   Data Reviewed and ED Course: pt was found to have WBC 6.9, creatinine 0.98, BUN 28, temperature normal, blood pressure 145/63, heart rate 56, RR 18, oxygen saturation 97% on room air.  X-ray of right hip showed dislocation.  Patient is admitted to [facility] bed as inpatient. Dr. Rowland Lathe of ortho is consulted.   X-ray of right hip: Superolateral right hip dislocation. Bilateral total hip arthroplasties with no evidence of hardware fracture or acute osseous fracture.    Consultants:  orthopedics  Procedures:   Antimicrobials:      Subjective: No sob, cp, c/o some pain at hip  Objective: Vitals:   04/06/22 1430 04/06/22 1947 04/07/22 0514 04/07/22 0823  BP: (!) 149/72 (!) 143/61 116/61 132/61  Pulse: (!) 58 (!) 51 99 (!) 51  Resp:  16 16 16   Temp:  98.1 F (36.7 C) 98 F (36.7 C) 98.2 F (36.8 C)  TempSrc:      SpO2: 98% 99% 94% 98%  Weight:      Height:        Intake/Output Summary (Last 24 hours) at 04/07/2022 0839 Last data filed at 04/07/2022 0156 Gross per 24 hour  Intake --  Output 950 ml  Net -950 ml   Filed Weights   04/06/22 1100   Weight: 75.8 kg    Examination: Calm, NAD Cta no w/r Reg s1/s2 no gallop Soft benign +bs No edema Awake and alert.  Mood and affect appropriate in current setting     Data Reviewed: I have personally reviewed following labs and imaging studies  CBC: Recent Labs  Lab 04/06/22 1228  WBC 6.9  NEUTROABS 4.6  HGB 12.1  HCT 37.1  MCV 101.4*  PLT 027   Basic Metabolic Panel: Recent Labs  Lab 04/06/22 1228  NA 140  K 4.5  CL 106  CO2 28  GLUCOSE 106*  BUN 42*  CREATININE 0.98  CALCIUM 9.4   GFR: Estimated Creatinine Clearance: 40.9 mL/min (by C-G formula based on SCr of 0.98 mg/dL). Liver Function Tests: No results for input(s): "AST", "ALT", "ALKPHOS", "BILITOT", "PROT", "ALBUMIN" in the last 168 hours. No results for input(s): "LIPASE", "AMYLASE" in the last 168 hours. No results for input(s): "AMMONIA" in the last 168 hours. Coagulation Profile: Recent Labs  Lab 04/06/22 1547  INR 1.1   Cardiac Enzymes: No results for input(s): "CKTOTAL", "CKMB", "CKMBINDEX", "TROPONINI" in the last 168 hours. BNP (last 3 results) No results for input(s): "PROBNP" in the last 8760 hours. HbA1C: No results for input(s): "HGBA1C" in the last 72 hours. CBG: No results for input(s): "GLUCAP" in the last 168 hours. Lipid Profile: No results for input(s): "CHOL", "HDL", "LDLCALC", "  TRIG", "CHOLHDL", "LDLDIRECT" in the last 72 hours. Thyroid Function Tests: No results for input(s): "TSH", "T4TOTAL", "FREET4", "T3FREE", "THYROIDAB" in the last 72 hours. Anemia Panel: No results for input(s): "VITAMINB12", "FOLATE", "FERRITIN", "TIBC", "IRON", "RETICCTPCT" in the last 72 hours. Sepsis Labs: No results for input(s): "PROCALCITON", "LATICACIDVEN" in the last 168 hours.  Recent Results (from the past 240 hour(s))  Surgical PCR screen     Status: Abnormal   Collection Time: 04/07/22  2:10 AM   Specimen: Nasal Mucosa; Nasal Swab  Result Value Ref Range Status   MRSA, PCR  NEGATIVE NEGATIVE Final   Staphylococcus aureus POSITIVE (A) NEGATIVE Final    Comment: (NOTE) The Xpert SA Assay (FDA approved for NASAL specimens in patients 56 years of age and older), is one component of a comprehensive surveillance program. It is not intended to diagnose infection nor to guide or monitor treatment. Performed at [facility], [address], [place], [place] [address]          Radiology Studies: DG Hip Malvin Johns or Wo Pelvis 2-3 Views Right  Result Date: 04/06/2022 CLINICAL DATA:  Hip pain EXAM: DG HIP (WITH OR WITHOUT PELVIS) 2-3V RIGHT COMPARISON:  Hip radiograph dated October 31, 2021 FINDINGS: Prior bilateral hip replacements with superolateral right hip dislocation. No evidence of acute osseous fracture. Osseous irregularity of the right lesser trochanter is similar when compared with prior radiograph. Severe degenerative disc disease of the lower lumbar spine. Soft tissues are unremarkable. IMPRESSION: Superolateral right hip dislocation. Bilateral total hip arthroplasties with no evidence of hardware fracture or acute osseous fracture. Electronically Signed   By: Yetta Glassman M.D.   On: 04/06/2022 11:56   DG Chest Portable 1 View  Result Date: 04/06/2022 CLINICAL DATA:  Right hip injury. EXAM: PORTABLE CHEST 1 VIEW COMPARISON:  11/18/2020 FINDINGS: The cardio pericardial silhouette is enlarged. Large hiatal hernia again noted. Interstitial markings are diffusely coarsened with chronic features. Basilar atelectasis noted bilaterally. Telemetry leads overlie the chest. IMPRESSION: 1. Stable exam. No acute cardiopulmonary findings. 2. Large hiatal hernia. Electronically Signed   By: Misty Stanley M.D.   On: 04/06/2022 11:54        Scheduled Meds:  allopurinol  200 mg Oral QHS   B-complex with vitamin C  1 tablet Oral Daily   calcium-vitamin D  1 tablet Oral Daily   carbidopa-levodopa  1 tablet Oral BID   cholecalciferol  2,000 Units Oral Daily    fesoterodine  4 mg Oral Daily   furosemide  30 mg Oral Daily   gabapentin  200 mg Oral BID   heparin  5,000 Units Subcutaneous Q8H   latanoprost  1 drop Both Eyes QPM   losartan  100 mg Oral Daily   mirabegron ER  25 mg Oral Daily   pantoprazole  40 mg Oral BID AC   sertraline  100 mg Oral Daily   Continuous Infusions:  [START ON 04/08/2022]  ceFAZolin (ANCEF) IV      Assessment & Plan:   Principal Problem:   Recurrent dislocation, right hip Active Problems:   Parkinson disease ([place])   Hypertension   Gout   Depression  Recurrent dislocation, right hip Orthopedics is consulted. Plan for surgical reduction on 6/22 N.p.o. at midnight tonight Robaxin for muscle spasm PT OT when cleared by orthopedics   Parkinson disease ([place]) Continue sinemet   Hypertension Continue lasix and ARB stable     Gout Continue allopurinol     Depression Continue Zoloft  DVT prophylaxis: Heparin Code Status: Full Family Communication: Family at bedside Disposition Plan: Plan for surgery on 6/22 Status is: Inpatient Remains inpatient appropriate because: IV treatment.  Plan for surgery in a.m.        LOS: 1 day   Time spent: 34 min     Nolberto Hanlon, MD [facility] Pager [phone]  If 7PM-7AM, please contact night-coverage 04/07/2022, 8:39 AM

## 2022-04-08 ENCOUNTER — Other Ambulatory Visit: Payer: Self-pay

## 2022-04-08 ENCOUNTER — Inpatient Hospital Stay: Payer: Medicare Other

## 2022-04-08 ENCOUNTER — Inpatient Hospital Stay: Payer: Medicare Other | Admitting: Anesthesiology

## 2022-04-08 ENCOUNTER — Encounter
Admission: EM | Disposition: A | Discharge status: Skilled Nursing Facility | Payer: Self-pay | Source: Home / Self Care | Attending: Internal Medicine

## 2022-04-08 ENCOUNTER — Encounter: Payer: Self-pay | Admitting: Internal Medicine

## 2022-04-08 DIAGNOSIS — M24451 Recurrent dislocation, right hip: Secondary | ICD-10-CM | POA: Diagnosis not present

## 2022-04-08 HISTORY — PX: ANTERIOR HIP REVISION: SHX6527

## 2022-04-08 LAB — CREATININE, SERUM
Creatinine, Ser: 0.94 mg/dL (ref 0.44–1.00)
GFR, Estimated: 59 mL/min — ABNORMAL LOW (ref >60)

## 2022-04-08 LAB — CBC
HCT: 36.1 % (ref 36.0–46.0)
Hemoglobin: 11.6 g/dL — ABNORMAL LOW (ref 12.0–15.0)
MCH: 32.2 pg (ref 26.0–34.0)
MCHC: 32.1 g/dL (ref 30.0–36.0)
MCV: 100.3 fL — ABNORMAL HIGH (ref 80.0–100.0)
Platelets: 188 10*3/uL (ref 150–400)
RBC: 3.6 MIL/uL — ABNORMAL LOW (ref 3.87–5.11)
RDW: 14.4 % (ref 11.5–15.5)
WBC: 8 10*3/uL (ref 4.0–10.5)
nRBC: 0 % (ref 0.0–0.2)

## 2022-04-08 LAB — POTASSIUM: Potassium: 4.3 mmol/L (ref 3.5–5.1)

## 2022-04-08 LAB — GLUCOSE, CAPILLARY: Glucose-Capillary: 92 mg/dL (ref 70–99)

## 2022-04-08 SURGERY — REVISION, TOTAL ARTHROPLASTY, HIP, ANTERIOR APPROACH
Anesthesia: General | Site: Hip | Laterality: Right

## 2022-04-08 MED ORDER — DEXAMETHASONE SODIUM PHOSPHATE 4 MG/ML IJ SOLN
INTRAMUSCULAR | Status: DC | PRN
  Administered 2022-04-08: 10 mg via INTRAVENOUS

## 2022-04-08 MED ORDER — CEFAZOLIN SODIUM-DEXTROSE 2-4 GM/100ML-% IV SOLN
2.0000 g | Freq: Four times a day (QID) | INTRAVENOUS | Status: AC
  Administered 2022-04-08 – 2022-04-09 (×2): 2 g via INTRAVENOUS
  Filled 2022-04-08 (×2): qty 100

## 2022-04-08 MED ORDER — MENTHOL 3 MG MT LOZG: 1.0000 | LOZENGE | OROMUCOSAL | Status: DC | PRN

## 2022-04-08 MED ORDER — SODIUM CHLORIDE FLUSH 0.9 % IV SOLN
INTRAVENOUS | Status: AC
  Filled 2022-04-08: qty 40

## 2022-04-08 MED ORDER — BUPIVACAINE HCL (PF) 0.5 % IJ SOLN
INTRAMUSCULAR | Status: DC | PRN
  Administered 2022-04-08: 2.5 mL

## 2022-04-08 MED ORDER — ACETAMINOPHEN 10 MG/ML IV SOLN: 1000.0000 mg | Freq: Once | INTRAVENOUS | Status: DC | PRN

## 2022-04-08 MED ORDER — HEMOSTATIC AGENTS (NO CHARGE) OPTIME
TOPICAL | Status: DC | PRN
  Administered 2022-04-08: 2 via TOPICAL

## 2022-04-08 MED ORDER — SODIUM CHLORIDE (PF) 0.9 % IJ SOLN
INTRAMUSCULAR | Status: DC | PRN
  Administered 2022-04-08: 90 mL

## 2022-04-08 MED ORDER — 0.9 % SODIUM CHLORIDE (POUR BTL) OPTIME
TOPICAL | Status: DC | PRN
  Administered 2022-04-08: 1000 mL

## 2022-04-08 MED ORDER — ONDANSETRON HCL 4 MG/2ML IJ SOLN: 4.0000 mg | Freq: Once | INTRAMUSCULAR | Status: DC | PRN

## 2022-04-08 MED ORDER — LACTATED RINGERS IV SOLN: INTRAVENOUS | Status: DC

## 2022-04-08 MED ORDER — DEXMEDETOMIDINE HCL IN NACL 200 MCG/50ML IV SOLN
INTRAVENOUS | Status: DC | PRN
  Administered 2022-04-08: 8 ug via INTRAVENOUS

## 2022-04-08 MED ORDER — BISACODYL 5 MG PO TBEC: 5.0000 mg | DELAYED_RELEASE_TABLET | Freq: Every day | ORAL | Status: DC | PRN

## 2022-04-08 MED ORDER — FLEET ENEMA 7-19 GM/118ML RE ENEM: 1.0000 | ENEMA | Freq: Once | RECTAL | Status: DC | PRN

## 2022-04-08 MED ORDER — METHOCARBAMOL 500 MG PO TABS
500.0000 mg | ORAL_TABLET | Freq: Four times a day (QID) | ORAL | Status: DC | PRN
  Administered 2022-04-11 (×2): 500 mg via ORAL
  Filled 2022-04-08 (×3): qty 1

## 2022-04-08 MED ORDER — HEPARIN 30,000 UNITS/1000 ML (OHS) CELLSAVER SOLUTION
Status: AC
  Filled 2022-04-08: qty 1000

## 2022-04-08 MED ORDER — OXYCODONE HCL 5 MG/5ML PO SOLN: 5.0000 mg | Freq: Once | ORAL | Status: DC | PRN

## 2022-04-08 MED ORDER — ONDANSETRON HCL 4 MG/2ML IJ SOLN: 4.0000 mg | Freq: Four times a day (QID) | INTRAMUSCULAR | Status: DC | PRN

## 2022-04-08 MED ORDER — ALUM & MAG HYDROXIDE-SIMETH 200-200-20 MG/5ML PO SUSP: 30.0000 mL | ORAL | Status: DC | PRN

## 2022-04-08 MED ORDER — DOCUSATE SODIUM 100 MG PO CAPS
100.0000 mg | ORAL_CAPSULE | Freq: Two times a day (BID) | ORAL | Status: DC
  Administered 2022-04-08 – 2022-04-12 (×8): 100 mg via ORAL
  Filled 2022-04-08 (×8): qty 1

## 2022-04-08 MED ORDER — CLOTRIMAZOLE 1 % EX CREA
TOPICAL_CREAM | Freq: Two times a day (BID) | CUTANEOUS | Status: DC
  Administered 2022-04-10: 1 via TOPICAL
  Filled 2022-04-08 (×2): qty 15

## 2022-04-08 MED ORDER — BUPIVACAINE LIPOSOME 1.3 % IJ SUSP
INTRAMUSCULAR | Status: AC
  Filled 2022-04-08: qty 20

## 2022-04-08 MED ORDER — ONDANSETRON HCL 4 MG PO TABS: 4.0000 mg | ORAL_TABLET | Freq: Four times a day (QID) | ORAL | Status: DC | PRN

## 2022-04-08 MED ORDER — DIPHENHYDRAMINE HCL 12.5 MG/5ML PO ELIX: 12.5000 mg | ORAL_SOLUTION | ORAL | Status: DC | PRN

## 2022-04-08 MED ORDER — PHENYLEPHRINE 80 MCG/ML (10ML) SYRINGE FOR IV PUSH (FOR BLOOD PRESSURE SUPPORT)
PREFILLED_SYRINGE | INTRAVENOUS | Status: DC | PRN
  Administered 2022-04-08: 160 ug via INTRAVENOUS

## 2022-04-08 MED ORDER — SURGIPHOR WOUND IRRIGATION SYSTEM - OPTIME
TOPICAL | Status: DC | PRN
  Administered 2022-04-08: 450 mL via TOPICAL

## 2022-04-08 MED ORDER — CEFAZOLIN SODIUM-DEXTROSE 2-4 GM/100ML-% IV SOLN
INTRAVENOUS | Status: AC
  Filled 2022-04-08: qty 100

## 2022-04-08 MED ORDER — METHOCARBAMOL 1000 MG/10ML IJ SOLN: 500.0000 mg | Freq: Four times a day (QID) | INTRAVENOUS | Status: DC | PRN

## 2022-04-08 MED ORDER — FENTANYL CITRATE (PF) 100 MCG/2ML IJ SOLN: 25.0000 ug | INTRAMUSCULAR | Status: DC | PRN

## 2022-04-08 MED ORDER — BUPIVACAINE-EPINEPHRINE (PF) 0.25% -1:200000 IJ SOLN
INTRAMUSCULAR | Status: AC
  Filled 2022-04-08: qty 30

## 2022-04-08 MED ORDER — PROPOFOL 1000 MG/100ML IV EMUL
INTRAVENOUS | Status: AC
  Filled 2022-04-08: qty 100

## 2022-04-08 MED ORDER — ENOXAPARIN SODIUM 40 MG/0.4ML IJ SOSY
40.0000 mg | PREFILLED_SYRINGE | INTRAMUSCULAR | Status: DC
  Administered 2022-04-09 – 2022-04-12 (×4): 40 mg via SUBCUTANEOUS
  Filled 2022-04-08 (×4): qty 0.4

## 2022-04-08 MED ORDER — LIDOCAINE HCL (CARDIAC) PF 100 MG/5ML IV SOSY
PREFILLED_SYRINGE | INTRAVENOUS | Status: DC | PRN
  Administered 2022-04-08: 3 mL via INTRAVENOUS

## 2022-04-08 MED ORDER — PROPOFOL 10 MG/ML IV BOLUS
INTRAVENOUS | Status: DC | PRN
  Administered 2022-04-08 (×2): 30 mg via INTRAVENOUS
  Administered 2022-04-08: 75 ug/kg/min via INTRAVENOUS

## 2022-04-08 MED ORDER — SODIUM CHLORIDE 0.9 % IV SOLN: INTRAVENOUS | Status: DC

## 2022-04-08 MED ORDER — PHENYLEPHRINE HCL-NACL 20-0.9 MG/250ML-% IV SOLN
INTRAVENOUS | Status: DC | PRN
  Administered 2022-04-08: 40 ug/min via INTRAVENOUS

## 2022-04-08 MED ORDER — OXYCODONE HCL 5 MG PO TABS: 5.0000 mg | ORAL_TABLET | Freq: Once | ORAL | Status: DC | PRN

## 2022-04-08 MED ORDER — SENNOSIDES-DOCUSATE SODIUM 8.6-50 MG PO TABS: 1.0000 | ORAL_TABLET | Freq: Every evening | ORAL | Status: DC | PRN

## 2022-04-08 MED ORDER — PHENOL 1.4 % MT LIQD: 1.0000 | OROMUCOSAL | Status: DC | PRN

## 2022-04-08 MED ORDER — EPHEDRINE SULFATE (PRESSORS) 50 MG/ML IJ SOLN
INTRAMUSCULAR | Status: DC | PRN
  Administered 2022-04-08 (×3): 5 mg via INTRAVENOUS
  Administered 2022-04-08: 10 mg via INTRAVENOUS

## 2022-04-08 MED ORDER — ZOLPIDEM TARTRATE 5 MG PO TABS: 5.0000 mg | ORAL_TABLET | Freq: Every evening | ORAL | Status: DC | PRN

## 2022-04-08 SURGICAL SUPPLY — 61 items
APL PRP STRL LF DISP 70% ISPRP (MISCELLANEOUS) ×2
BALL HIP ARTICU 28 +5 (Hips) IMPLANT
BLADE CUP REMOVAL  56 LNG (MISCELLANEOUS) ×3
BLADE CUP REMOVAL  56 SHRT (MISCELLANEOUS) ×3
BLADE CUP REMOVAL 56 LNG (MISCELLANEOUS) IMPLANT
BLADE CUP REMOVAL 56 SHRT (MISCELLANEOUS) IMPLANT
BLADE SAGITTAL AGGR TOOTH XLG (BLADE) ×2 IMPLANT
BNDG CMPR 5X6 CHSV STRCH STRL (GAUZE/BANDAGES/DRESSINGS) ×6
BNDG COHESIVE 6X5 TAN ST LF (GAUZE/BANDAGES/DRESSINGS) ×9 IMPLANT
CANISTER WOUND CARE 500ML ATS (WOUND CARE) ×3 IMPLANT
CHLORAPREP W/TINT 26 (MISCELLANEOUS) ×3 IMPLANT
COVER BACK TABLE REUSABLE LG (DRAPES) ×3 IMPLANT
CUP ACETAB VERSA DBL 28X58 DMI (Orthopedic Implant) ×1 IMPLANT
DRAPE 3/4 80X56 (DRAPES) ×9 IMPLANT
DRAPE C-ARM XRAY 36X54 (DRAPES) ×3 IMPLANT
DRAPE INCISE IOBAN 66X60 STRL (DRAPES) IMPLANT
DRAPE POUCH INSTRU U-SHP 10X18 (DRAPES) ×3 IMPLANT
DRESSING SURGICEL FIBRLLR 1X2 (HEMOSTASIS) ×4 IMPLANT
DRSG MEPILEX SACRM 8.7X9.8 (GAUZE/BANDAGES/DRESSINGS) ×3 IMPLANT
DRSG SURGICEL FIBRILLAR 1X2 (HEMOSTASIS) ×6
ELECT BLADE 6.5 EXT (BLADE) ×3 IMPLANT
ELECT REM PT RETURN 9FT ADLT (ELECTROSURGICAL) ×3
ELECTRODE REM PT RTRN 9FT ADLT (ELECTROSURGICAL) ×2 IMPLANT
GLOVE BIOGEL PI IND STRL 9 (GLOVE) ×2 IMPLANT
GLOVE BIOGEL PI INDICATOR 9 (GLOVE) ×1
GLOVE SURG SYN 9.0  PF PI (GLOVE) ×6
GLOVE SURG SYN 9.0 PF PI (GLOVE) ×4 IMPLANT
GOWN SRG 2XL LVL 4 RGLN SLV (GOWNS) ×2 IMPLANT
GOWN STRL NON-REIN 2XL LVL4 (GOWNS) ×3
GOWN STRL REUS W/ TWL LRG LVL3 (GOWN DISPOSABLE) ×2 IMPLANT
GOWN STRL REUS W/TWL LRG LVL3 (GOWN DISPOSABLE) ×3
HIP BALL ARTICU 28 +5 (Hips) ×3 IMPLANT
HOLDER FOLEY CATH W/STRAP (MISCELLANEOUS) ×3 IMPLANT
HOOD PEEL AWAY FLYTE STAYCOOL (MISCELLANEOUS) ×3 IMPLANT
KIT PREVENA INCISION MGT 13 (CANNISTER) ×3 IMPLANT
MANIFOLD NEPTUNE II (INSTRUMENTS) ×3 IMPLANT
MAT ABSORB  FLUID 56X50 GRAY (MISCELLANEOUS) ×3
MAT ABSORB FLUID 56X50 GRAY (MISCELLANEOUS) ×2 IMPLANT
MectaBlades- Cup Removal ×2 IMPLANT
NDL SPNL 20GX3.5 QUINCKE YW (NEEDLE) ×4 IMPLANT
NEEDLE SPNL 20GX3.5 QUINCKE YW (NEEDLE) ×6 IMPLANT
NS IRRIG 1000ML POUR BTL (IV SOLUTION) ×3 IMPLANT
PACK HIP COMPR (MISCELLANEOUS) ×3 IMPLANT
SCALPEL PROTECTED #10 DISP (BLADE) ×6 IMPLANT
SHELL ACETABULAR SZ0 58MM (Shell) ×1 IMPLANT
SOLUTION IRRIG SURGIPHOR (IV SOLUTION) ×3 IMPLANT
STAPLER SKIN PROX 35W (STAPLE) ×3 IMPLANT
STRAP SAFETY 5IN WIDE (MISCELLANEOUS) ×3 IMPLANT
SUT DVC 2 QUILL PDO  T11 36X36 (SUTURE) ×3
SUT DVC 2 QUILL PDO T11 36X36 (SUTURE) ×2 IMPLANT
SUT SILK 0 (SUTURE) ×3
SUT SILK 0 30XBRD TIE 6 (SUTURE) ×2 IMPLANT
SUT V-LOC 90 ABS DVC 3-0 CL (SUTURE) ×3 IMPLANT
SUT VIC AB 1 CT1 36 (SUTURE) ×3 IMPLANT
SYR 50ML LL SCALE MARK (SYRINGE) ×6 IMPLANT
SYR BULB IRRIG 60ML STRL (SYRINGE) ×3 IMPLANT
TAPE MICROFOAM 4IN (TAPE) IMPLANT
TAPE STRIPS DRAPE STRL (GAUZE/BANDAGES/DRESSINGS) ×3 IMPLANT
TOWEL OR 17X26 4PK STRL BLUE (TOWEL DISPOSABLE) IMPLANT
TRAY FOLEY MTR SLVR 16FR STAT (SET/KITS/TRAYS/PACK) ×3 IMPLANT
WATER STERILE IRR 1000ML POUR (IV SOLUTION) ×3 IMPLANT

## 2022-04-08 NOTE — Progress Notes (Signed)
PROGRESS NOTE    Yvonne Singleton  QIH:[id number] DOB: May 10, 1935 DOA: 04/06/2022 PCP: Perrin Maltese, MD    Brief Narrative:  Yvonne Singleton is a 87 y.o. female with medical history significant of multiple hip dislocations in the past, hypertension, hyperlipidemia, GERD, gout, depression, Parkinson's disease, hard of hearing, breast cancer (s/p of right breast radiation therapy), rheumatoid arthritis, C. difficile colitis, GI bleeding, who presents with right hip pain.   Pt states that she twisted her right hip and felt a pop when was trying to take her compression socks off today.  She developed right heel pain, which is constant, sharp, moderate, nonradiating.  No fall or any injury.  Patient does not have chest pain, cough, shortness of breath.  No nausea, vomiting, diarrhea or abdominal pain.  No symptoms of UTI.   Data Reviewed and ED Course: pt was found to have WBC 6.9, creatinine 0.98, BUN 28, temperature normal, blood pressure 145/63, heart rate 56, RR 18, oxygen saturation 97% on room air.  X-ray of right hip showed dislocation.  Patient is admitted to [facility] bed as inpatient. Dr. Rowland Lathe of ortho is consulted.   X-ray of right hip: Superolateral right hip dislocation. Bilateral total hip arthroplasties with no evidence of hardware fracture or acute osseous fracture.  6/22 s/p OR this morning  Consultants:  orthopedics  Procedures:   Antimicrobials:      Subjective: No sob or cp. Pain stable currently.   Objective: Vitals:   04/07/22 1722 04/07/22 2026 04/08/22 0432 04/08/22 0801  BP: (!) 131/56 (!) 116/59 (!) 103/55 (!) 115/50  Pulse: 69 62 (!) 51 (!) 56  Resp: 18 16 20 16   Temp: 98.4 F (36.9 C) 98.3 F (36.8 C) 98.4 F (36.9 C) 98.2 F (36.8 C)  TempSrc: Oral     SpO2: 98% 95% 94% 95%  Weight:      Height:        Intake/Output Summary (Last 24 hours) at 04/08/2022 0833 Last data filed at 04/08/2022 0436 Gross per 24 hour  Intake --  Output  1700 ml  Net -1700 ml   Filed Weights   04/06/22 1100  Weight: 75.8 kg    Examination: Calm, NAD Cta no w/r Reg s1/s2 no gallop Soft benign +bs No edema, wound vac to Rt groin Aaoxox3  Mood and affect appropriate in current setting     Data Reviewed: I have personally reviewed following labs and imaging studies  CBC: Recent Labs  Lab 04/06/22 1228  WBC 6.9  NEUTROABS 4.6  HGB 12.1  HCT 37.1  MCV 101.4*  PLT 875   Basic Metabolic Panel: Recent Labs  Lab 04/06/22 1228 04/08/22 0339  NA 140  --   K 4.5 4.3  CL 106  --   CO2 28  --   GLUCOSE 106*  --   BUN 42*  --   CREATININE 0.98  --   CALCIUM 9.4  --    GFR: Estimated Creatinine Clearance: 40.9 mL/min (by C-G formula based on SCr of 0.98 mg/dL). Liver Function Tests: No results for input(s): "AST", "ALT", "ALKPHOS", "BILITOT", "PROT", "ALBUMIN" in the last 168 hours. No results for input(s): "LIPASE", "AMYLASE" in the last 168 hours. No results for input(s): "AMMONIA" in the last 168 hours. Coagulation Profile: Recent Labs  Lab 04/06/22 1547  INR 1.1   Cardiac Enzymes: No results for input(s): "CKTOTAL", "CKMB", "CKMBINDEX", "TROPONINI" in the last 168 hours. BNP (last 3 results) No results for input(s): "PROBNP" in  the last 8760 hours. HbA1C: No results for input(s): "HGBA1C" in the last 72 hours. CBG: Recent Labs  Lab 04/08/22 0727  GLUCAP 92   Lipid Profile: No results for input(s): "CHOL", "HDL", "LDLCALC", "TRIG", "CHOLHDL", "LDLDIRECT" in the last 72 hours. Thyroid Function Tests: No results for input(s): "TSH", "T4TOTAL", "FREET4", "T3FREE", "THYROIDAB" in the last 72 hours. Anemia Panel: No results for input(s): "VITAMINB12", "FOLATE", "FERRITIN", "TIBC", "IRON", "RETICCTPCT" in the last 72 hours. Sepsis Labs: No results for input(s): "PROCALCITON", "LATICACIDVEN" in the last 168 hours.  Recent Results (from the past 240 hour(s))  Surgical PCR screen     Status: Abnormal    Collection Time: 04/07/22  2:10 AM   Specimen: Nasal Mucosa; Nasal Swab  Result Value Ref Range Status   MRSA, PCR NEGATIVE NEGATIVE Final   Staphylococcus aureus POSITIVE (A) NEGATIVE Final    Comment: (NOTE) The Xpert SA Assay (FDA approved for NASAL specimens in patients 73 years of age and older), is one component of a comprehensive surveillance program. It is not intended to diagnose infection nor to guide or monitor treatment. Performed at [facility], [address] [place], [place] [address]          Radiology Studies: DG Hip Malvin Johns or Wo Pelvis 2-3 Views Right  Result Date: 04/06/2022 CLINICAL DATA:  Hip pain EXAM: DG HIP (WITH OR WITHOUT PELVIS) 2-3V RIGHT COMPARISON:  Hip radiograph dated October 31, 2021 FINDINGS: Prior bilateral hip replacements with superolateral right hip dislocation. No evidence of acute osseous fracture. Osseous irregularity of the right lesser trochanter is similar when compared with prior radiograph. Severe degenerative disc disease of the lower lumbar spine. Soft tissues are unremarkable. IMPRESSION: Superolateral right hip dislocation. Bilateral total hip arthroplasties with no evidence of hardware fracture or acute osseous fracture. Electronically Signed   By: Yetta Glassman M.D.   On: 04/06/2022 11:56   DG Chest Portable 1 View  Result Date: 04/06/2022 CLINICAL DATA:  Right hip injury. EXAM: PORTABLE CHEST 1 VIEW COMPARISON:  11/18/2020 FINDINGS: The cardio pericardial silhouette is enlarged. Large hiatal hernia again noted. Interstitial markings are diffusely coarsened with chronic features. Basilar atelectasis noted bilaterally. Telemetry leads overlie the chest. IMPRESSION: 1. Stable exam. No acute cardiopulmonary findings. 2. Large hiatal hernia. Electronically Signed   By: Misty Stanley M.D.   On: 04/06/2022 11:54        Scheduled Meds:  allopurinol  200 mg Oral QHS   B-complex with vitamin C  1 tablet Oral Daily    calcium-vitamin D  1 tablet Oral Daily   carbidopa-levodopa  1 tablet Oral BID   Chlorhexidine Gluconate Cloth  6 each Topical Q0600   cholecalciferol  2,000 Units Oral Daily   feeding supplement  237 mL Oral BID BM   fesoterodine  4 mg Oral Daily   furosemide  30 mg Oral Daily   gabapentin  200 mg Oral BID   heparin  5,000 Units Subcutaneous Q8H   latanoprost  1 drop Both Eyes QPM   losartan  100 mg Oral Daily   mirabegron ER  25 mg Oral Daily   mupirocin ointment  1 Application Nasal BID   pantoprazole  40 mg Oral BID AC   sertraline  100 mg Oral Daily   Continuous Infusions:   ceFAZolin (ANCEF) IV      Assessment & Plan:   Principal Problem:   Recurrent dislocation, right hip Active Problems:   Parkinson disease ([place])   Hypertension  Gout   Depression  Recurrent dislocation, right hip Orthopedics following S/p OR today Pain control Bowel regimen     Parkinson disease ([facility]) Continue sinemet   Hypertension Bp post op on lower side Will hold lasix and ARB for now     Gout Continue allopurinol     Depression Continue Zoloft  DVT prophylaxis: Heparin Code Status: Full Family Communication: Family at bedside Disposition Plan: TBD, likely will need SNF Status is: Inpatient Remains inpatient appropriate because: IV treatment. S/p surgery today        LOS: 2 days   Time spent: 35 min     Nolberto Hanlon, MD [facility] Pager [phone]  If 7PM-7AM, please contact night-coverage 04/08/2022, 8:33 AM

## 2022-04-08 NOTE — Transfer of Care (Signed)
Immediate Anesthesia Transfer of Care Note  Patient: Yvonne Singleton  Procedure(s) Performed: Anterior hip revision cup and femoral head (Right: Hip) APPLICATION OF CELL SAVER  Patient Location: PACU  Anesthesia Type:General and Spinal  Level of Consciousness: alert  and patient cooperative  Airway & Oxygen Therapy: Patient Spontanous Breathing and Patient connected to face mask  Post-op Assessment: Report given to RN and Post -op Vital signs reviewed and stable  Post vital signs: Reviewed and stable  Last Vitals:  Vitals Value Taken Time  BP 108/45 04/08/22 1424  Temp 36.9 C 04/08/22 1424  Pulse 76 04/08/22 1427  Resp 17 04/08/22 1427  SpO2 99 % 04/08/22 1427  Vitals shown include unvalidated device data.  Last Pain:  Vitals:   04/08/22 1104  TempSrc: Temporal  PainSc: 5       Patients Stated Pain Goal: 3 (55/73/22 0254)  Complications: No notable events documented.

## 2022-04-08 NOTE — Anesthesia Preprocedure Evaluation (Addendum)
Anesthesia Evaluation  Patient identified by MRN, date of birth, ID band Patient awake    Reviewed: Allergy & Precautions, NPO status , Patient's Chart, lab work & pertinent test results  History of Anesthesia Complications Negative for: history of anesthetic complications  Airway Mallampati: IV   Neck ROM: Full    Dental  (+) Edentulous Upper, Edentulous Lower   Pulmonary sleep apnea ,    Pulmonary exam normal breath sounds clear to auscultation       Cardiovascular hypertension, Normal cardiovascular exam Rhythm:Regular Rate:Normal  ECG 04/06/22:  Sinus rhythm Atrial premature complexes Right axis deviation Low voltage, precordial leads   Neuro/Psych PSYCHIATRIC DISORDERS Anxiety Depression HOH  Neuromuscular disease (Parkinson disease)    GI/Hepatic hiatal hernia, GERD  Medicated and Controlled,  Endo/Other  negative endocrine ROS  Renal/GU Renal disease (stage III CKD)     Musculoskeletal Gout    Abdominal   Peds  Hematology Breast CA   Anesthesia Other Findings   Reproductive/Obstetrics                            Anesthesia Physical Anesthesia Plan  ASA: 3  Anesthesia Plan: General and Spinal   Post-op Pain Management:    Induction: Intravenous  PONV Risk Score and Plan: 3 and Propofol infusion, TIVA, Treatment may vary due to age or medical condition and Ondansetron  Airway Management Planned: Natural Airway and Nasal Cannula  Additional Equipment:   Intra-op Plan:   Post-operative Plan:   Informed Consent: I have reviewed the patients History and Physical, chart, labs and discussed the procedure including the risks, benefits and alternatives for the proposed anesthesia with the patient or authorized representative who has indicated his/her understanding and acceptance.       Plan Discussed with: CRNA  Anesthesia Plan Comments: (Plan for spinal and GA with  natural airway, LMA/GETA backup.  Patient consented for risks of anesthesia including but not limited to:  - adverse reactions to medications - damage to eyes, teeth, lips or other oral mucosa - nerve damage due to positioning  - sore throat or hoarseness - headache, bleeding, infection, nerve damage 2/2 spinal - damage to heart, brain, nerves, lungs, other parts of body or loss of life  Informed patient about role of CRNA in peri- and intra-operative care.  Patient voiced understanding.)       Anesthesia Quick Evaluation

## 2022-04-08 NOTE — Plan of Care (Signed)

## 2022-04-08 NOTE — Anesthesia Procedure Notes (Signed)
Spinal  Patient location during procedure: OR Start time: 04/08/2022 12:35 PM End time: 04/08/2022 12:42 PM Reason for block: surgical anesthesia Staffing Performed: other anesthesia staff  Other anesthesia staff: Carmelina Paddock, RN Performed by: Carmelina Paddock, RN Authorized by: Darrin Nipper, MD   Preanesthetic Checklist Completed: patient identified, IV checked, site marked, risks and benefits discussed, surgical consent, monitors and equipment checked, pre-op evaluation and timeout performed Spinal Block Patient position: sitting Prep: ChloraPrep and site prepped and draped Patient monitoring: heart rate, continuous pulse ox, blood pressure and cardiac monitor Approach: midline Location: L3-4 Injection technique: single-shot Needle Needle type: Whitacre and Introducer  Needle gauge: 25 G Needle length: 9 cm Assessment Sensory level: T10 Events: CSF return Additional Notes Sterile aseptic technique used throughout the procedure.  Negative paresthesia. Negative blood return. Positive free-flowing CSF. Expiration date of kit checked and confirmed. Patient tolerated procedure well, without complications.

## 2022-04-08 NOTE — Op Note (Signed)
04/08/2022  2:28 PM  PATIENT:  Yvonne Singleton  87 y.o. female  PRE-OPERATIVE DIAGNOSIS:  Recurrent dislocated right total hip  POST-OPERATIVE DIAGNOSIS:  Recurrent dislocated right total hip  PROCEDURE:  Procedure(s): Anterior hip revision cup and femoral head (Right) APPLICATION OF CELL SAVER (N/A)  SURGEON: Laurene Footman, MD  ASSISTANTS: None  ANESTHESIA:   spinal  EBL:  Total I/O In: 1000 [I.V.:1000] Out: 450 [Urine:250; Blood:200]  BLOOD ADMINISTERED:none  DRAINS:  Incisional wound VAC    LOCAL MEDICATIONS USED:  MARCAINE    and OTHER Exparel  SPECIMEN:  No Specimen  DISPOSITION OF SPECIMEN:  N/A  COUNTS:  YES  TOURNIQUET:  * No tourniquets in log *  IMPLANTS: Medacta impact DM cup and liner 58 mm with DePuy 28 mm +5 femoral head  DICTATION: .Dragon Dictation   The patient was brought to the operating room and after spinal anesthesia was obtained patient was placed on the operative table with the ipsilateral foot into the Medacta attachment, contralateral leg on a well-padded table. C-arm was brought in and preop template x-ray taken. After prepping and draping in usual sterile fashion appropriate patient identification and timeout procedures were completed. Anterior approach to the hip was obtained and centered over the greater trochanter and TFL muscle. The subcutaneous tissue was incised hemostasis being achieved by electrocautery. TFL fascia was incised and the muscle retracted laterally deep retractor placed. The lateral femoral circumflex vessels were identified and ligated. The anterior capsule was exposed and a capsulotomy performed.  The prior cup and head were identified and with traction pulling the head forward the hip was reduced and it had been dislocated preoperatively.  Intraoperative x-rays taken at this point to help assess subsequent alignment.  The femoral head was knocked off the implant without difficulty using a bone tamp and the locking  ring mechanism then removed off the proximal stem.  There was extensive fibrous tissue consistent with poly wear and inspection of the cup there was a groove where hit the hip had been subluxing.  The explant device was used for Medacta to cut out the prior cup with minimal bone loss.  Reaming was carried out with a prior 56 mm cup first 54 then 5656 trial did not fit well the 58 trial gave better fill in with minimal anterior and posterior wall bone left no further reaming was indicated and the 58 mm cup was impacted into position.  Based on preop x-rays prior x-rays and templating intraoperatively a +5 head was then assembled with a 58 mm Mpact DM liner and assembled.  This was then impacted onto the the prior DePuy stem.  The hip was reduced and was stable the wound was thoroughly irrigated with fibrillar placed along the posterior capsule and medial neck. The deep fascia ws closed using a heavy Quill after infiltration of 30 cc of quarter percent Sensorcaine with epinephrine diluted with Exparel .3-0 V-loc to close the skin with skin staples.  Incisional wound VAC applied patient was sent to recovery in stable condition.  Cell Saver was utilized during the procedure but there is minimal blood loss and no blood could be reinfused.  PLAN OF CARE:  Continue as inpatient

## 2022-04-09 ENCOUNTER — Encounter: Payer: Self-pay | Admitting: Orthopedic Surgery

## 2022-04-09 DIAGNOSIS — M24451 Recurrent dislocation, right hip: Secondary | ICD-10-CM | POA: Diagnosis not present

## 2022-04-09 LAB — BASIC METABOLIC PANEL
Anion gap: 6 (ref 5–15)
BUN: 42 mg/dL — ABNORMAL HIGH (ref 8–23)
CO2: 28 mmol/L (ref 22–32)
Calcium: 8.6 mg/dL — ABNORMAL LOW (ref 8.9–10.3)
Chloride: 103 mmol/L (ref 98–111)
Creatinine, Ser: 1.03 mg/dL — ABNORMAL HIGH (ref 0.44–1.00)
GFR, Estimated: 53 mL/min — ABNORMAL LOW (ref >60)
Glucose, Bld: 123 mg/dL — ABNORMAL HIGH (ref 70–99)
Potassium: 4.3 mmol/L (ref 3.5–5.1)
Sodium: 137 mmol/L (ref 135–145)

## 2022-04-09 LAB — CBC
HCT: 31.1 % — ABNORMAL LOW (ref 36.0–46.0)
Hemoglobin: 10.2 g/dL — ABNORMAL LOW (ref 12.0–15.0)
MCH: 32.3 pg (ref 26.0–34.0)
MCHC: 32.8 g/dL (ref 30.0–36.0)
MCV: 98.4 fL (ref 80.0–100.0)
Platelets: 174 10*3/uL (ref 150–400)
RBC: 3.16 MIL/uL — ABNORMAL LOW (ref 3.87–5.11)
RDW: 13.9 % (ref 11.5–15.5)
WBC: 12.6 10*3/uL — ABNORMAL HIGH (ref 4.0–10.5)
nRBC: 0 % (ref 0.0–0.2)

## 2022-04-09 LAB — GLUCOSE, CAPILLARY: Glucose-Capillary: 114 mg/dL — ABNORMAL HIGH (ref 70–99)

## 2022-04-09 MED ORDER — OXYCODONE-ACETAMINOPHEN 5-325 MG PO TABS: 1.0000 | ORAL_TABLET | ORAL | 0 refills | Status: DC | PRN

## 2022-04-09 MED ORDER — DOCUSATE SODIUM 100 MG PO CAPS: 100.0000 mg | ORAL_CAPSULE | Freq: Two times a day (BID) | ORAL | 0 refills | Status: DC

## 2022-04-09 MED ORDER — LACTATED RINGERS IV SOLN: INTRAVENOUS | Status: DC

## 2022-04-09 MED ORDER — ENOXAPARIN SODIUM 40 MG/0.4ML IJ SOSY: 40.0000 mg | PREFILLED_SYRINGE | INTRAMUSCULAR | 0 refills | Status: DC

## 2022-04-09 NOTE — Care Management Important Message (Signed)
Important Message  Patient Details  Name: Yvonne Singleton MRN: [id number] Date of Birth: Dec 09, 1934   Medicare Important Message Given:  Yes     [name] A [name] 04/09/2022, 2:20 PM

## 2022-04-09 NOTE — TOC Progression Note (Signed)
Transition of Care [facility]) - Progression Note    Patient Details  Name: Yvonne Singleton MRN: [id number] Date of Birth: [birth year]  Transition of Care [facility]) CM/SW Contact  Marlowe Sax, RN Phone Number: 04/09/2022, 10:03 AM  Clinical Narrative:     Patient will need to go to STR SNF, She is agreeable, PASSR Obtained, Fl2 completed, Bedsearch sent, she does not want to go to [facility], Will review bed choices once obtained       Expected Discharge Plan and Services                                                 Social Determinants of Health (SDOH) Interventions    Readmission Risk Interventions     No data to display

## 2022-04-10 DIAGNOSIS — M24451 Recurrent dislocation, right hip: Secondary | ICD-10-CM | POA: Diagnosis not present

## 2022-04-10 LAB — CBC
HCT: 26.7 % — ABNORMAL LOW (ref 36.0–46.0)
Hemoglobin: 8.8 g/dL — ABNORMAL LOW (ref 12.0–15.0)
MCH: 32.4 pg (ref 26.0–34.0)
MCHC: 33 g/dL (ref 30.0–36.0)
MCV: 98.2 fL (ref 80.0–100.0)
Platelets: 154 10*3/uL (ref 150–400)
RBC: 2.72 MIL/uL — ABNORMAL LOW (ref 3.87–5.11)
RDW: 14 % (ref 11.5–15.5)
WBC: 10.4 10*3/uL (ref 4.0–10.5)
nRBC: 0 % (ref 0.0–0.2)

## 2022-04-10 LAB — GLUCOSE, CAPILLARY: Glucose-Capillary: 67 mg/dL — ABNORMAL LOW (ref 70–99)

## 2022-04-10 MED ORDER — MIDODRINE HCL 5 MG PO TABS
2.5000 mg | ORAL_TABLET | Freq: Three times a day (TID) | ORAL | Status: DC
  Administered 2022-04-10 – 2022-04-12 (×7): 2.5 mg via ORAL
  Filled 2022-04-10 (×7): qty 1

## 2022-04-10 MED ORDER — OXYCODONE-ACETAMINOPHEN 5-325 MG PO TABS
1.0000 | ORAL_TABLET | ORAL | Status: DC | PRN
  Administered 2022-04-10 (×2): 2 via ORAL
  Administered 2022-04-11 – 2022-04-12 (×5): 1 via ORAL
  Administered 2022-04-12: 2 via ORAL
  Filled 2022-04-10 (×2): qty 1
  Filled 2022-04-10: qty 2
  Filled 2022-04-10: qty 1
  Filled 2022-04-10: qty 2
  Filled 2022-04-10 (×2): qty 1
  Filled 2022-04-10: qty 2

## 2022-04-10 NOTE — Progress Notes (Addendum)
Physical Therapy Treatment Patient Details Name: Yvonne Singleton MRN: [id number] DOB: 11/15/1934 Today's Date: 04/10/2022   History of Present Illness Pt is an 87 y/o F admitted on 04/06/22 after presenting with c/o R hip pain. Pt found to have Superolateral right hip dislocation. Pt underwent anterior hip revision cup & femoral head on 04/08/22. PMH: multiple hip dislocations, HTN, HLD, GERD, gout, depression, PD, HOH, breast CA (s/p R breast radiation therapy), RA, c diff colitis, GI bleeding    PT Comments    Pt seen for PT tx with nurse in room checking vitals, BP 101/47 mmHg MAP 65, & administering pain meds. Pt reports increasing pain in anterior R thigh & wishes to get back to bed. Pt attempts STS multiple times but unable to come to full upright standing to transition BUE from armrests to crutches. Attempted squat pivot with +1 assist but ultimately required +2 for success as pt is much more pain limited this session. Pt requires mod/max assist for sit>supine. Continue to recommend STR upon d/c. Pt left with BLE SCDs donned & wound vac intact & plugged in.    Recommendations for follow up therapy are one component of a multi-disciplinary discharge planning process, led by the attending physician.  Recommendations may be updated based on patient status, additional functional criteria and insurance authorization.  Follow Up Recommendations  Skilled nursing-short term rehab (<3 hours/day) Can patient physically be transported by private vehicle: No   Assistance Recommended at Discharge Frequent or constant Supervision/Assistance  Patient can return home with the following Assistance with cooking/housework;Assist for transportation;Help with stairs or ramp for entrance;A lot of help with walking and/or transfers;A lot of help with bathing/dressing/bathroom   Equipment Recommendations  BSC/3in1    Recommendations for Other Services       Precautions / Restrictions  Precautions Precautions: Fall Restrictions Weight Bearing Restrictions: Yes RLE Weight Bearing: Weight bearing as tolerated     Mobility  Bed Mobility   Bed Mobility: Sit to Supine       Sit to supine: Mod assist, Max assist   General bed mobility comments: assistance to elevate BLE onto bed, assistance to reposition in bed    Transfers Overall transfer level: Needs assistance Equipment used: None         Squat pivot transfers: Mod assist, Max assist, +2 physical assistance          Ambulation/Gait                   Stairs             Wheelchair Mobility    Modified Rankin (Stroke Patients Only)       Balance                                            Cognition Arousal/Alertness: Awake/alert Behavior During Therapy: WFL for tasks assessed/performed Overall Cognitive Status: Within Functional Limits for tasks assessed                                 General Comments: pleasant lady, follows commands/cuing throughout session        Exercises Total Joint Exercises Quad Sets: AROM, Strengthening, Right, 10 reps Towel Squeeze: AROM, Strengthening, Both, 10 reps, Seated (hip adduction pillow squeeze) Heel Slides: AAROM, Strengthening, Right, 10 reps Hip ABduction/ADduction:  AAROM, Strengthening, Right, 10 reps Long Arc Quad: AROM, Strengthening, Right, 10 reps, Seated    General Comments        Pertinent Vitals/Pain Pain Assessment Pain Assessment: Faces Pain Score: 8  Faces Pain Scale: Hurts even more Pain Location: R hip/anterior thigh Pain Descriptors / Indicators: Discomfort, Sore, Burning Pain Intervention(s): Monitored during session, RN gave pain meds during session, Repositioned, Limited activity within patient's tolerance    Home Living                          Prior Function            PT Goals (current goals can now be found in the care plan section) Acute Rehab PT  Goals Patient Stated Goal: get better, return to PLOF PT Goal Formulation: With patient Time For Goal Achievement: 04/23/22 Potential to Achieve Goals: Good Progress towards PT goals: Progressing toward goals    Frequency    BID      PT Plan Current plan remains appropriate    Co-evaluation              AM-PAC PT "6 Clicks" Mobility   Outcome Measure  Help needed turning from your back to your side while in a flat bed without using bedrails?: A Lot Help needed moving from lying on your back to sitting on the side of a flat bed without using bedrails?: A Lot Help needed moving to and from a bed to a chair (including a wheelchair)?: Total Help needed standing up from a chair using your arms (e.g., wheelchair or bedside chair)?: Total Help needed to walk in hospital room?: Total Help needed climbing 3-5 steps with a railing? : Total 6 Click Score: 8    End of Session Equipment Utilized During Treatment: Gait belt Activity Tolerance: Patient limited by pain Patient left: in bed;with call bell/phone within reach;with bed alarm set (set up with meal tray) Nurse Communication: Mobility status PT Visit Diagnosis: Unsteadiness on feet (R26.81);Muscle weakness (generalized) (M62.81);Difficulty in walking, not elsewhere classified (R26.2);Pain;Other abnormalities of gait and mobility (R26.89) Pain - Right/Left: Right Pain - part of body: Hip     Time: 7829-5621 PT Time Calculation (min) (ACUTE ONLY): 21 min  Charges:  $Therapeutic Exercise: 8-22 mins $Therapeutic Activity: 8-22 mins                     Aleda Grana, PT, DPT 04/10/22, 1:04 PM    Sandi Mariscal 04/10/2022, 1:01 PM

## 2022-04-11 DIAGNOSIS — M24451 Recurrent dislocation, right hip: Secondary | ICD-10-CM | POA: Diagnosis not present

## 2022-04-11 LAB — HEMOGLOBIN AND HEMATOCRIT, BLOOD
HCT: 27.6 % — ABNORMAL LOW (ref 36.0–46.0)
Hemoglobin: 8.9 g/dL — ABNORMAL LOW (ref 12.0–15.0)

## 2022-04-11 LAB — GLUCOSE, CAPILLARY: Glucose-Capillary: 84 mg/dL (ref 70–99)

## 2022-04-11 LAB — CREATININE, SERUM
Creatinine, Ser: 1.16 mg/dL — ABNORMAL HIGH (ref 0.44–1.00)
GFR, Estimated: 46 mL/min — ABNORMAL LOW (ref >60)

## 2022-04-11 MED ORDER — GABAPENTIN 300 MG PO CAPS
300.0000 mg | ORAL_CAPSULE | Freq: Two times a day (BID) | ORAL | Status: DC
  Administered 2022-04-11 – 2022-04-12 (×2): 300 mg via ORAL
  Filled 2022-04-11 (×2): qty 1

## 2022-04-12 DIAGNOSIS — M24451 Recurrent dislocation, right hip: Secondary | ICD-10-CM | POA: Diagnosis not present

## 2022-04-12 MED ORDER — SENNOSIDES-DOCUSATE SODIUM 8.6-50 MG PO TABS: 1.0000 | ORAL_TABLET | Freq: Every evening | ORAL | Status: DC | PRN

## 2022-04-12 MED ORDER — METHOCARBAMOL 500 MG PO TABS: 500.0000 mg | ORAL_TABLET | Freq: Three times a day (TID) | ORAL | Status: AC | PRN

## 2022-04-12 MED ORDER — GABAPENTIN 300 MG PO CAPS: 300.0000 mg | ORAL_CAPSULE | Freq: Two times a day (BID) | ORAL | Status: DC

## 2022-04-12 MED ORDER — MIDODRINE HCL 2.5 MG PO TABS: 2.5000 mg | ORAL_TABLET | Freq: Three times a day (TID) | ORAL | Status: DC

## 2022-04-12 MED ORDER — CLOTRIMAZOLE 1 % EX CREA: TOPICAL_CREAM | Freq: Two times a day (BID) | CUTANEOUS | 0 refills | Status: DC

## 2022-04-12 MED ORDER — ACETAMINOPHEN 325 MG PO TABS: 650.0000 mg | ORAL_TABLET | Freq: Four times a day (QID) | ORAL | Status: DC | PRN

## 2022-04-12 MED ORDER — ENSURE ENLIVE PO LIQD: 237.0000 mL | Freq: Two times a day (BID) | ORAL | 12 refills | Status: DC

## 2022-04-12 NOTE — Progress Notes (Signed)
Physical Therapy Treatment Patient Details Name: Yvonne Singleton MRN: [id number] DOB: 1935-08-14 Today's Date: 04/12/2022   History of Present Illness Pt is an 87 y/o F admitted on 04/06/22 after presenting with c/o R hip pain. Pt found to have Superolateral right hip dislocation. Pt underwent anterior hip revision cup & femoral head on 04/08/22. PMH: multiple hip dislocations, HTN, HLD, GERD, gout, depression, PD, HOH, breast CA (s/p R breast radiation therapy), RA, c diff colitis, GI bleeding    PT Comments    Pt seen for PT tx with pt premedicated for session. Pt engages in RLE strengthening exercises with improving AAROM. Pt is able to advance mobility & ambulate 5 ft x 2 with crutches & min assist with pt ambulating laterally, stepping with LLE & sliding/scooting RLE. Will continue to follow pt to progress strength, activity tolerance, transfers & gait with LRAD.  R hip wound vac intact at end of session.    Recommendations for follow up therapy are one component of a multi-disciplinary discharge planning process, led by the attending physician.  Recommendations may be updated based on patient status, additional functional criteria and insurance authorization.  Follow Up Recommendations  Skilled nursing-short term rehab (<3 hours/day) Can patient physically be transported by private vehicle: No   Assistance Recommended at Discharge Frequent or constant Supervision/Assistance  Patient can return home with the following Assistance with cooking/housework;Assist for transportation;Help with stairs or ramp for entrance;Two people to help with walking and/or transfers;Two people to help with bathing/dressing/bathroom   Equipment Recommendations  BSC/3in1    Recommendations for Other Services       Precautions / Restrictions Precautions Precautions: Fall Restrictions Weight Bearing Restrictions: Yes RLE Weight Bearing: Weight bearing as tolerated     Mobility  Bed Mobility                General bed mobility comments: pt received & left in recliner    Transfers Overall transfer level: Needs assistance Equipment used: Crutches   Sit to Stand: Mod assist           General transfer comment: Extra time to power up to standing, PT assists pt with holding/handing crutches to pt. Does not reach back for stable sitting surface prior to stand>sit.    Ambulation/Gait Ambulation/Gait assistance: Min assist Gait Distance (Feet): 5 Feet (+ 5 ft) Assistive device: Crutches   Gait velocity: decreased     General Gait Details: Pt ambulates laterally to L vs fowards, stepping with LLE but scooting/dragging RLE with use of crutches but does tolerate more upright standing mobility on this date.   Stairs             Wheelchair Mobility    Modified Rankin (Stroke Patients Only)       Balance Overall balance assessment: Needs assistance Sitting-balance support: Feet supported Sitting balance-Leahy Scale: Good Sitting balance - Comments: sitting on BSC with supervision   Standing balance support: Bilateral upper extremity supported, During functional activity, Reliant on assistive device for balance Standing balance-Leahy Scale: Poor                              Cognition Arousal/Alertness: Awake/alert Behavior During Therapy: WFL for tasks assessed/performed Overall Cognitive Status: Within Functional Limits for tasks assessed  General Comments: pleasant lady, follows commands/cuing throughout session        Exercises Total Joint Exercises Short Arc Quad: AROM, Strengthening, Right, 10 reps Heel Slides: AAROM, Strengthening, Right, 10 reps Long Arc Quad: AROM, Strengthening, Right, 10 reps, Seated    General Comments General comments (skin integrity, edema, etc.): Pt with continent void & small continent BM on BSC, performing peri hygiene without assistance.      Pertinent  Vitals/Pain Pain Assessment Pain Assessment: 0-10 Pain Score: 6  Pain Location: R hip/anterior thigh Pain Descriptors / Indicators: Discomfort, Sore, Burning Pain Intervention(s): Premedicated before session, Monitored during session, Limited activity within patient's tolerance    Home Living                          Prior Function            PT Goals (current goals can now be found in the care plan section) Acute Rehab PT Goals Patient Stated Goal: get better, return to PLOF PT Goal Formulation: With patient Time For Goal Achievement: 04/23/22 Potential to Achieve Goals: Good Progress towards PT goals: Progressing toward goals    Frequency    BID      PT Plan Current plan remains appropriate    Co-evaluation              AM-PAC PT "6 Clicks" Mobility   Outcome Measure  Help needed turning from your back to your side while in a flat bed without using bedrails?: A Lot Help needed moving from lying on your back to sitting on the side of a flat bed without using bedrails?: A Lot Help needed moving to and from a bed to a chair (including a wheelchair)?: Total Help needed standing up from a chair using your arms (e.g., wheelchair or bedside chair)?: A Lot Help needed to walk in hospital room?: A Lot Help needed climbing 3-5 steps with a railing? : Total 6 Click Score: 10    End of Session Equipment Utilized During Treatment: Gait belt Activity Tolerance: Patient tolerated treatment well Patient left: in chair;with chair alarm set;with call bell/phone within reach   PT Visit Diagnosis: Unsteadiness on feet (R26.81);Muscle weakness (generalized) (M62.81);Difficulty in walking, not elsewhere classified (R26.2);Pain;Other abnormalities of gait and mobility (R26.89) Pain - Right/Left: Right Pain - part of body: Hip     Time: 1610-9604 PT Time Calculation (min) (ACUTE ONLY): 23 min  Charges:  $Therapeutic Activity: 23-37 mins                      Aleda Grana, PT, DPT 04/12/22, 12:31 PM   Sandi Mariscal 04/12/2022, 12:29 PM

## 2022-05-12 ENCOUNTER — Emergency Department: Payer: Medicare Other

## 2022-05-12 ENCOUNTER — Inpatient Hospital Stay
Admission: EM | Admit: 2022-05-12 | Discharge: 2022-05-17 | DRG: 689 | Disposition: A | Discharge status: Skilled Nursing Facility | Payer: Medicare Other | Source: Skilled Nursing Facility | Attending: Internal Medicine | Admitting: Internal Medicine

## 2022-05-12 ENCOUNTER — Encounter: Payer: Self-pay | Admitting: Emergency Medicine

## 2022-05-12 ENCOUNTER — Other Ambulatory Visit: Payer: Self-pay

## 2022-05-12 DIAGNOSIS — H9190 Unspecified hearing loss, unspecified ear: Secondary | ICD-10-CM | POA: Diagnosis present

## 2022-05-12 DIAGNOSIS — Z9842 Cataract extraction status, left eye: Secondary | ICD-10-CM

## 2022-05-12 DIAGNOSIS — Z8261 Family history of arthritis: Secondary | ICD-10-CM

## 2022-05-12 DIAGNOSIS — T465X5A Adverse effect of other antihypertensive drugs, initial encounter: Secondary | ICD-10-CM | POA: Diagnosis not present

## 2022-05-12 DIAGNOSIS — N390 Urinary tract infection, site not specified: Principal | ICD-10-CM | POA: Diagnosis present

## 2022-05-12 DIAGNOSIS — Z8249 Family history of ischemic heart disease and other diseases of the circulatory system: Secondary | ICD-10-CM

## 2022-05-12 DIAGNOSIS — K219 Gastro-esophageal reflux disease without esophagitis: Secondary | ICD-10-CM | POA: Diagnosis present

## 2022-05-12 DIAGNOSIS — G629 Polyneuropathy, unspecified: Secondary | ICD-10-CM | POA: Diagnosis present

## 2022-05-12 DIAGNOSIS — G20 Parkinson's disease: Secondary | ICD-10-CM | POA: Diagnosis present

## 2022-05-12 DIAGNOSIS — Z91041 Radiographic dye allergy status: Secondary | ICD-10-CM

## 2022-05-12 DIAGNOSIS — M109 Gout, unspecified: Secondary | ICD-10-CM | POA: Diagnosis present

## 2022-05-12 DIAGNOSIS — Z79899 Other long term (current) drug therapy: Secondary | ICD-10-CM

## 2022-05-12 DIAGNOSIS — R410 Disorientation, unspecified: Secondary | ICD-10-CM | POA: Diagnosis not present

## 2022-05-12 DIAGNOSIS — N3001 Acute cystitis with hematuria: Principal | ICD-10-CM

## 2022-05-12 DIAGNOSIS — N3000 Acute cystitis without hematuria: Secondary | ICD-10-CM

## 2022-05-12 DIAGNOSIS — G9341 Metabolic encephalopathy: Secondary | ICD-10-CM | POA: Diagnosis not present

## 2022-05-12 DIAGNOSIS — Z923 Personal history of irradiation: Secondary | ICD-10-CM

## 2022-05-12 DIAGNOSIS — Z853 Personal history of malignant neoplasm of breast: Secondary | ICD-10-CM

## 2022-05-12 DIAGNOSIS — Z888 Allergy status to other drugs, medicaments and biological substances status: Secondary | ICD-10-CM

## 2022-05-12 DIAGNOSIS — M069 Rheumatoid arthritis, unspecified: Secondary | ICD-10-CM | POA: Diagnosis present

## 2022-05-12 DIAGNOSIS — M24451 Recurrent dislocation, right hip: Secondary | ICD-10-CM | POA: Diagnosis present

## 2022-05-12 DIAGNOSIS — F32A Depression, unspecified: Secondary | ICD-10-CM | POA: Diagnosis present

## 2022-05-12 DIAGNOSIS — I10 Essential (primary) hypertension: Secondary | ICD-10-CM | POA: Diagnosis present

## 2022-05-12 DIAGNOSIS — Z9841 Cataract extraction status, right eye: Secondary | ICD-10-CM

## 2022-05-12 DIAGNOSIS — E785 Hyperlipidemia, unspecified: Secondary | ICD-10-CM | POA: Diagnosis present

## 2022-05-12 DIAGNOSIS — E538 Deficiency of other specified B group vitamins: Secondary | ICD-10-CM | POA: Diagnosis present

## 2022-05-12 DIAGNOSIS — R519 Headache, unspecified: Secondary | ICD-10-CM | POA: Diagnosis not present

## 2022-05-12 DIAGNOSIS — Z96653 Presence of artificial knee joint, bilateral: Secondary | ICD-10-CM | POA: Diagnosis present

## 2022-05-12 DIAGNOSIS — Z96643 Presence of artificial hip joint, bilateral: Secondary | ICD-10-CM | POA: Diagnosis present

## 2022-05-12 HISTORY — DX: Metabolic encephalopathy: G93.41

## 2022-05-12 HISTORY — DX: Urinary tract infection, site not specified: N39.0

## 2022-05-12 LAB — CBC
HCT: 35.1 % — ABNORMAL LOW (ref 36.0–46.0)
Hemoglobin: 11.3 g/dL — ABNORMAL LOW (ref 12.0–15.0)
MCH: 32.5 pg (ref 26.0–34.0)
MCHC: 32.2 g/dL (ref 30.0–36.0)
MCV: 100.9 fL — ABNORMAL HIGH (ref 80.0–100.0)
Platelets: 211 10*3/uL (ref 150–400)
RBC: 3.48 MIL/uL — ABNORMAL LOW (ref 3.87–5.11)
RDW: 13.6 % (ref 11.5–15.5)
WBC: 8.3 10*3/uL (ref 4.0–10.5)
nRBC: 0 % (ref 0.0–0.2)

## 2022-05-12 LAB — URINALYSIS, ROUTINE W REFLEX MICROSCOPIC
Bilirubin Urine: NEGATIVE
Glucose, UA: NEGATIVE mg/dL
Ketones, ur: NEGATIVE mg/dL
Leukocytes,Ua: NEGATIVE
Nitrite: NEGATIVE
Protein, ur: 100 mg/dL — AB
Specific Gravity, Urine: 1.011 (ref 1.005–1.030)
Squamous Epithelial / HPF: NONE SEEN (ref 0–5)
pH: 6 (ref 5.0–8.0)

## 2022-05-12 LAB — COMPREHENSIVE METABOLIC PANEL
ALT: <5 U/L (ref 0–44)
AST: 15 U/L (ref 15–41)
Albumin: 3.5 g/dL (ref 3.5–5.0)
Alkaline Phosphatase: 126 U/L (ref 38–126)
Anion gap: 9 (ref 5–15)
BUN: 15 mg/dL (ref 8–23)
CO2: 26 mmol/L (ref 22–32)
Calcium: 8.9 mg/dL (ref 8.9–10.3)
Chloride: 105 mmol/L (ref 98–111)
Creatinine, Ser: 0.79 mg/dL (ref 0.44–1.00)
GFR, Estimated: >60 mL/min (ref >60)
Glucose, Bld: 93 mg/dL (ref 70–99)
Potassium: 3.9 mmol/L (ref 3.5–5.1)
Sodium: 140 mmol/L (ref 135–145)
Total Bilirubin: 0.8 mg/dL (ref 0.3–1.2)
Total Protein: 6.5 g/dL (ref 6.5–8.1)

## 2022-05-12 MED ORDER — OXYCODONE-ACETAMINOPHEN 5-325 MG PO TABS
1.0000 | ORAL_TABLET | Freq: Three times a day (TID) | ORAL | Status: DC | PRN
  Administered 2022-05-12 – 2022-05-14 (×2): 1 via ORAL
  Filled 2022-05-12 (×2): qty 1

## 2022-05-12 MED ORDER — ONDANSETRON HCL 4 MG/2ML IJ SOLN: 4.0000 mg | Freq: Three times a day (TID) | INTRAMUSCULAR | Status: DC | PRN

## 2022-05-12 MED ORDER — SERTRALINE HCL 50 MG PO TABS
100.0000 mg | ORAL_TABLET | Freq: Every day | ORAL | Status: DC
  Administered 2022-05-13 – 2022-05-17 (×5): 100 mg via ORAL
  Filled 2022-05-12 (×5): qty 2

## 2022-05-12 MED ORDER — LATANOPROST 0.005 % OP SOLN
1.0000 [drp] | Freq: Every day | OPHTHALMIC | Status: DC
  Administered 2022-05-14 – 2022-05-16 (×3): 1 [drp] via OPHTHALMIC
  Filled 2022-05-12: qty 2.5

## 2022-05-12 MED ORDER — MIRABEGRON ER 25 MG PO TB24
25.0000 mg | ORAL_TABLET | Freq: Every day | ORAL | Status: DC
  Administered 2022-05-13 – 2022-05-17 (×5): 25 mg via ORAL
  Filled 2022-05-12 (×5): qty 1

## 2022-05-12 MED ORDER — VITAMIN D 25 MCG (1000 UNIT) PO TABS
2000.0000 [IU] | ORAL_TABLET | Freq: Every day | ORAL | Status: DC
  Administered 2022-05-13 – 2022-05-17 (×5): 2000 [IU] via ORAL
  Filled 2022-05-12 (×5): qty 2

## 2022-05-12 MED ORDER — FESOTERODINE FUMARATE ER 4 MG PO TB24
4.0000 mg | ORAL_TABLET | Freq: Every day | ORAL | Status: DC
  Administered 2022-05-13 – 2022-05-17 (×5): 4 mg via ORAL
  Filled 2022-05-12 (×5): qty 1

## 2022-05-12 MED ORDER — SODIUM CHLORIDE 0.9 % IV SOLN
1.0000 g | INTRAVENOUS | Status: DC
  Administered 2022-05-13 – 2022-05-16 (×4): 1 g via INTRAVENOUS
  Filled 2022-05-12: qty 10
  Filled 2022-05-12 (×2): qty 1
  Filled 2022-05-12: qty 10
  Filled 2022-05-12: qty 1

## 2022-05-12 MED ORDER — OYSTER SHELL CALCIUM/D3 500-5 MG-MCG PO TABS
1.0000 | ORAL_TABLET | Freq: Two times a day (BID) | ORAL | Status: DC
  Administered 2022-05-13 – 2022-05-17 (×9): 1 via ORAL
  Filled 2022-05-12 (×9): qty 1

## 2022-05-12 MED ORDER — LIDOCAINE 5 % EX PTCH: 1.0000 | MEDICATED_PATCH | Freq: Every day | CUTANEOUS | Status: DC | PRN

## 2022-05-12 MED ORDER — ACETAMINOPHEN 325 MG PO TABS
650.0000 mg | ORAL_TABLET | Freq: Four times a day (QID) | ORAL | Status: DC | PRN
  Administered 2022-05-13 – 2022-05-16 (×3): 650 mg via ORAL
  Filled 2022-05-12 (×3): qty 2

## 2022-05-12 MED ORDER — ENOXAPARIN SODIUM 40 MG/0.4ML IJ SOSY
40.0000 mg | PREFILLED_SYRINGE | INTRAMUSCULAR | Status: DC
  Administered 2022-05-12 – 2022-05-16 (×5): 40 mg via SUBCUTANEOUS
  Filled 2022-05-12 (×5): qty 0.4

## 2022-05-12 MED ORDER — CARBIDOPA-LEVODOPA ER 50-200 MG PO TBCR
1.0000 | EXTENDED_RELEASE_TABLET | Freq: Two times a day (BID) | ORAL | Status: DC
  Administered 2022-05-12 – 2022-05-17 (×10): 1 via ORAL
  Filled 2022-05-12 (×10): qty 1

## 2022-05-12 MED ORDER — DOCUSATE SODIUM 100 MG PO CAPS
100.0000 mg | ORAL_CAPSULE | Freq: Two times a day (BID) | ORAL | Status: DC
  Administered 2022-05-12 – 2022-05-17 (×8): 100 mg via ORAL
  Filled 2022-05-12 (×8): qty 1

## 2022-05-12 MED ORDER — SODIUM CHLORIDE 0.9 % IV SOLN
1.0000 g | Freq: Once | INTRAVENOUS | Status: AC
  Administered 2022-05-12: 1 g via INTRAVENOUS
  Filled 2022-05-12: qty 10

## 2022-05-12 MED ORDER — LACTATED RINGERS IV BOLUS
1000.0000 mL | Freq: Once | INTRAVENOUS | Status: AC
  Administered 2022-05-12: 1000 mL via INTRAVENOUS

## 2022-05-12 MED ORDER — B COMPLEX-C PO TABS
1.0000 | ORAL_TABLET | Freq: Every day | ORAL | Status: DC
  Administered 2022-05-13 – 2022-05-17 (×5): 1 via ORAL
  Filled 2022-05-12 (×5): qty 1

## 2022-05-12 MED ORDER — HYDRALAZINE HCL 20 MG/ML IJ SOLN
5.0000 mg | INTRAMUSCULAR | Status: DC | PRN
  Administered 2022-05-12 – 2022-05-14 (×3): 5 mg via INTRAVENOUS
  Filled 2022-05-12 (×3): qty 1

## 2022-05-12 MED ORDER — SODIUM CHLORIDE 0.9 % IV BOLUS
500.0000 mL | Freq: Once | INTRAVENOUS | Status: AC
  Administered 2022-05-12: 500 mL via INTRAVENOUS

## 2022-05-12 MED ORDER — ALLOPURINOL 100 MG PO TABS
200.0000 mg | ORAL_TABLET | Freq: Every day | ORAL | Status: DC
  Administered 2022-05-12 – 2022-05-16 (×5): 200 mg via ORAL
  Filled 2022-05-12 (×5): qty 2

## 2022-05-12 MED ORDER — PANTOPRAZOLE SODIUM 40 MG PO TBEC
40.0000 mg | DELAYED_RELEASE_TABLET | Freq: Two times a day (BID) | ORAL | Status: DC
  Administered 2022-05-12 – 2022-05-17 (×10): 40 mg via ORAL
  Filled 2022-05-12 (×10): qty 1

## 2022-05-12 MED ORDER — POLYETHYLENE GLYCOL 3350 17 G PO PACK
17.0000 g | PACK | Freq: Two times a day (BID) | ORAL | Status: DC
  Administered 2022-05-12 – 2022-05-16 (×6): 17 g via ORAL
  Filled 2022-05-12 (×8): qty 1

## 2022-05-12 MED ORDER — MIDODRINE HCL 5 MG PO TABS
2.5000 mg | ORAL_TABLET | Freq: Three times a day (TID) | ORAL | Status: DC
Start: 2022-05-12 — End: 2022-05-17
  Administered 2022-05-13 – 2022-05-17 (×4): 2.5 mg via ORAL
  Filled 2022-05-12 (×5): qty 1

## 2022-05-12 MED ORDER — ENSURE ENLIVE PO LIQD
237.0000 mL | Freq: Two times a day (BID) | ORAL | Status: DC
  Administered 2022-05-12 – 2022-05-17 (×10): 237 mL via ORAL

## 2022-05-12 MED ORDER — CLOTRIMAZOLE 1 % EX CREA
TOPICAL_CREAM | Freq: Two times a day (BID) | CUTANEOUS | Status: DC
Start: 2022-05-13 — End: 2022-05-17
  Administered 2022-05-13 – 2022-05-14 (×3): 1 via TOPICAL
  Filled 2022-05-12: qty 15

## 2022-05-12 NOTE — Care Management (Signed)
Care team assessing patient on TOC attempt to see patient.  Patient is a Short Term Rehab with [facility], as confirmed by Tammy at facility.  As per Tammy, patient is eligible to return to facility on discharge.  RNCM will follow up when patient is available for assessment.

## 2022-05-12 NOTE — Assessment & Plan Note (Addendum)
Not on antihypertensives as outpatient, actually on midodrine for hypotension. --Continue midodrine with hold parameters for SBP above 130 or DBP above 90 --Midodrine not given since parameters added and BP remains quite elevated --As needed PO hydralazine --Adjusted pain regimen as outlined --Start amlodipine 5 mg daily tomorrow if BP remains elevated

## 2022-05-12 NOTE — Assessment & Plan Note (Addendum)
Urinalysis with hazy appearance, negative leukocyte, with WBC 0-5, but urinalysis showed many bacteria, indicating possible UTI. Urine culture contaminated, grew multiple species. Repeat culture no growth indicating adequate antibiotic coverage. -- On Rocephin, complete 5-day course

## 2022-05-12 NOTE — ED Notes (Signed)
Patient becoming increasingly more agitated. Patient pulling at cords and IVs. Son at bedside attempting to redirect patient. Patient incontinent of urine and feces. Bedside cleaning done. EDP made aware.

## 2022-05-12 NOTE — Assessment & Plan Note (Addendum)
Suspect multifactorial due to likely UTI and polypharmacy given patient on pain medications and Neurontin.  CT head negative.  No focal neurodeficit on physical examination.  Patient is taking OxyContin 10 mg twice daily and Percocet 5/325 mg every 4 hours as needed, also on Neurontin.  Urinalysis showed possible UTI.  Started on empiric Rocephin on admission. TSH normal.  Vitamin B12 a bit low.  Vitamin D normal.  7/29-30: mentation back to baseline  -- Stopped OxyContin (by chart review, unclear when this was started or by whom) - discontinue -- Stopped Percocet -- Resumed Neurontin -- Continue scheduled Tylenol 1000 mg TID -- Continue oxycodone IR 5 mg q6h PRN -- Treated for UTI as outlined -- Delirium precautions -- Started vitamin B12 supplement

## 2022-05-12 NOTE — ED Notes (Signed)
Patient transported to X-ray 

## 2022-05-12 NOTE — ED Notes (Signed)
Informed RN bed assigned 

## 2022-05-12 NOTE — ED Triage Notes (Signed)
Pt to ED via [facility] from [facility] for Altered mental status and tremors. Per EMS staff also reports that pt has been leaning to the left more over the last few days. Per EMS pt has hx/o Parkinson's disease. Pt altered with EMS but in NAD.

## 2022-05-12 NOTE — H&P (Signed)
History and Physical    Yvonne Singleton OZD:[id number] DOB: 24-Mar-1935 DOA: 05/12/2022  Referring MD/NP/PA:   PCP: Perrin Maltese, MD   Patient coming from:  The patient is coming from SNF     Chief Complaint: AMS   HPI: Yvonne Singleton is a 87 y.o. female with medical history significant of multiple hip dislocations in the past, hypertension, hyperlipidemia, GERD, gout, depression, Parkinson's disease, hard of hearing, breast cancer (s/p of right breast radiation therapy), rheumatoid arthritis, C. difficile colitis, GI bleeding, who presents with altered mental status.  Patient was recently hospitalized from 6/20 - 6/26 due to recurrent right hip dislocation.  Patient had right hip surgery in that admission and discharged to SNF. Per her son at the bedside, patient has been confused in the past several days.  Normally patient is alert oriented x3.  When I saw patient in ED, she knows her own name, but is not orientated to time and place.  She moves all extremities.  No facial droop or slurred speech.  No chest pain, cough, shortness of breath.  No active nausea, vomiting, diarrhea noted.  Denies symptoms of UTI.   Data reviewed independently and ED Course: pt was found to have WBC 8.3, urinalysis (hazy appearance, negative leukocyte, many bacteria, WBC 0-5), GFR> 60, temperature normal, blood pressure 109/82, heart rate of 75, RR 24, 19, oxygen saturation 90-97% on room air.  CT of head is negative for acute intracranial abnormalities.  X-ray of right hip is negative for acute bony fracture or dislocation.  Patient is placed on MedSurg bed for observation.   EKG: I have personally reviewed.  Sinus rhythm, QTc 441, nonspecific T wave change, with artificial effects due to Parkinson's disease in the shaking   Review of Systems: Could not reviewed accurately due to altered mental status.   Allergy:  Allergies  Allergen Reactions   Trospium Nausea And Vomiting   Digoxin      Other reaction(s): Unknown Note: pt had lethargy, felt bad Note: pt had lethargy, felt bad  Other reaction(s): Unknown Note: pt had lethargy, felt bad  Other reaction(s): Unknown Note: pt had lethargy, felt bad Note: pt had lethargy, felt bad   Iodinated Contrast Media Hives   Streptomycin Hives    Past Medical History:  Diagnosis Date   Back pain    Breast cancer ([place]) 2007   right breast lumpectomy with rad tx   Cancer ([place]) 2007   right breast   Collagen vascular disease ([place])    Gout    Hard of hearing    Hypertension    Parkinson's disease ([place])    Personal history of radiation therapy 2007   F/U right breast cancer    Past Surgical History:  Procedure Laterality Date   ABDOMINAL HYSTERECTOMY     ANTERIOR HIP REVISION Right 04/08/2022   Procedure: Anterior hip revision cup and femoral head;  Surgeon: Hessie Knows, MD;  Location: [facility] ORS;  Service: Orthopedics;  Laterality: Right;   BREAST LUMPECTOMY Right 2007   suspicious calcs, f/u with radiation   CATARACT EXTRACTION Bilateral    ESOPHAGOGASTRODUODENOSCOPY (EGD) WITH PROPOFOL N/A 05/25/2017   Procedure: ESOPHAGOGASTRODUODENOSCOPY (EGD) WITH PROPOFOL;  Surgeon: Lollie Sails, MD;  Location: [facility] ENDOSCOPY;  Service: Endoscopy;  Laterality: N/A;   ESOPHAGOGASTRODUODENOSCOPY (EGD) WITH PROPOFOL N/A 05/09/2018   Procedure: ESOPHAGOGASTRODUODENOSCOPY (EGD) WITH PROPOFOL;  Surgeon: Lin Landsman, MD;  Location: [facility] ENDOSCOPY;  Service: Gastroenterology;  Laterality: N/A;   ESOPHAGOGASTRODUODENOSCOPY (EGD) WITH PROPOFOL  N/A 12/09/2021   Procedure: ESOPHAGOGASTRODUODENOSCOPY (EGD) WITH PROPOFOL;  Surgeon: Lin Landsman, MD;  Location: [facility] ENDOSCOPY;  Service: Gastroenterology;  Laterality: N/A;   EYE SURGERY Bilateral    HAND SURGERY     HIP CLOSED REDUCTION Left 06/24/2015   Procedure: CLOSED REDUCTION HIP;  Surgeon: Dereck Leep, MD;  Location: [facility] ORS;  Service: Orthopedics;  Laterality: Left;    HIP CLOSED REDUCTION Left 02/09/2016   Procedure: CLOSED MANIPULATION HIP;  Surgeon: Earnestine Leys, MD;  Location: [facility] ORS;  Service: Orthopedics;  Laterality: Left;   HIP CLOSED REDUCTION Left 10/31/2021   Procedure: CLOSED REDUCTION HIP;  Surgeon: Corky Mull, MD;  Location: [facility] ORS;  Service: Orthopedics;  Laterality: Left;   HIP SURGERY     JOINT REPLACEMENT     bilateral hip   OOPHORECTOMY     TOTAL KNEE ARTHROPLASTY Bilateral     Social History:  reports that she has never smoked. She has never used smokeless tobacco. She reports that she does not drink alcohol and does not use drugs.  Family History:  Family History  Problem Relation Age of Onset   Heart disease Mother    Arthritis Mother    Heart disease Father    Ulcers Father    Breast cancer Maternal Aunt      Prior to Admission medications   Medication Sig Start Date End Date Taking? Authorizing Provider  acetaminophen (TYLENOL) 325 MG tablet Take 2 tablets (650 mg total) by mouth every 6 (six) hours as needed for mild pain or fever. 04/12/22   Nolberto Hanlon, MD  allopurinol (ZYLOPRIM) 100 MG tablet Take 200 mg by mouth at bedtime.     [provider]  b complex vitamins tablet Take 1 tablet by mouth daily.    [provider]  Calcium Carbonate-Vitamin D 600-200 MG-UNIT TABS Take 1 tablet by mouth daily.    [provider]  carbidopa-levodopa (SINEMET CR) 50-200 MG per tablet Take 1 tablet by mouth 2 (two) times daily.    [provider]  cholecalciferol (VITAMIN D) 1000 units tablet Take 2,000 Units by mouth daily.    [provider]  clotrimazole (LOTRIMIN) 1 % cream Apply topically 2 (two) times daily. 04/12/22   Nolberto Hanlon, MD  docusate sodium (COLACE) 100 MG capsule Take 1 capsule (100 mg total) by mouth 2 (two) times daily. 04/09/22   Duanne Guess, PA-C  enoxaparin (LOVENOX) 40 MG/0.4ML injection Inject 0.4 mLs (40 mg total) into the skin daily for 14 days. 04/09/22  04/23/22  Duanne Guess, PA-C  feeding supplement (ENSURE ENLIVE / ENSURE PLUS) LIQD Take 237 mLs by mouth 2 (two) times daily between meals. 04/12/22   Nolberto Hanlon, MD  gabapentin (NEURONTIN) 300 MG capsule Take 1 capsule (300 mg total) by mouth 2 (two) times daily. 04/12/22   Nolberto Hanlon, MD  latanoprost (XALATAN) 0.005 % ophthalmic solution Place 1 drop into both eyes every evening. 02/19/22   [provider]  midodrine (PROAMATINE) 2.5 MG tablet Take 1 tablet (2.5 mg total) by mouth 3 (three) times daily with meals. 04/12/22   Nolberto Hanlon, MD  MYRBETRIQ 25 MG TB24 tablet Take 25 mg by mouth daily. 10/08/21   [provider]  oxyCODONE-acetaminophen (PERCOCET/ROXICET) 5-325 MG tablet Take 1 tablet by mouth every 4 (four) hours as needed for moderate pain. 04/09/22   Duanne Guess, PA-C  pantoprazole (PROTONIX) 40 MG tablet Take 1 tablet (40 mg total) by mouth 2 (  two) times daily before a meal. 12/09/21 04/06/22  Vanga, Tally Due, MD  senna-docusate (SENOKOT-S) 8.6-50 MG tablet Take 1 tablet by mouth at bedtime as needed for mild constipation. 04/12/22   Nolberto Hanlon, MD  sertraline (ZOLOFT) 100 MG tablet Take 100 mg by mouth daily. 09/30/21   [provider]  tolterodine (DETROL LA) 4 MG 24 hr capsule Take 4 mg by mouth daily.    [provider]    Physical Exam: Vitals:   05/12/22 1323 05/12/22 1356 05/12/22 1531 05/12/22 1540  BP: 109/82  (!) 167/86 (!) 169/75  Pulse: 66  (!) 58 61  Resp: 19  16 (!) 21  Temp:  98.4 F (36.9 C) 98.4 F (36.9 C)   TempSrc:  Oral Oral   SpO2: 97%  95% 96%  Weight:      Height:       General: Not in acute distress. HEENT:       Eyes: PERRL, EOMI, no scleral icterus.       ENT: No discharge from the ears and nose       Neck: No JVD, no bruit, no mass felt. Heme: No neck lymph node enlargement. Cardiac: S1/S2, RRR, No murmurs, No gallops or rubs. Respiratory: No rales, wheezing, rhonchi or rubs. GI: Soft,  nondistended, nontender, no organomegaly, BS present. GU: No hematuria Ext: No pitting leg edema bilaterally. 1+DP/PT pulse bilaterally. Musculoskeletal: No joint deformities, No joint redness or warmth, no limitation of ROM in spin. Skin: No rashes.  Neuro: Confused, knows her own name, is not oriented to time and place, cranial nerves II-XII grossly intact, moves all extremities normally. Psych: Patient is not psychotic, no suicidal or hemocidal ideation.  Labs on Admission: I have personally reviewed following labs and imaging studies  CBC: Recent Labs  Lab 05/12/22 0954  WBC 8.3  HGB 11.3*  HCT 35.1*  MCV 100.9*  PLT 716   Basic Metabolic Panel: Recent Labs  Lab 05/12/22 0954  NA 140  K 3.9  CL 105  CO2 26  GLUCOSE 93  BUN 15  CREATININE 0.79  CALCIUM 8.9   GFR: Estimated Creatinine Clearance: 48.5 mL/min (by C-G formula based on SCr of 0.79 mg/dL). Liver Function Tests: Recent Labs  Lab 05/12/22 0954  AST 15  ALT <5  ALKPHOS 126  BILITOT 0.8  PROT 6.5  ALBUMIN 3.5   No results for input(s): "LIPASE", "AMYLASE" in the last 168 hours. No results for input(s): "AMMONIA" in the last 168 hours. Coagulation Profile: No results for input(s): "INR", "PROTIME" in the last 168 hours. Cardiac Enzymes: No results for input(s): "CKTOTAL", "CKMB", "CKMBINDEX", "TROPONINI" in the last 168 hours. BNP (last 3 results) No results for input(s): "PROBNP" in the last 8760 hours. HbA1C: No results for input(s): "HGBA1C" in the last 72 hours. CBG: No results for input(s): "GLUCAP" in the last 168 hours. Lipid Profile: No results for input(s): "CHOL", "HDL", "LDLCALC", "TRIG", "CHOLHDL", "LDLDIRECT" in the last 72 hours. Thyroid Function Tests: No results for input(s): "TSH", "T4TOTAL", "FREET4", "T3FREE", "THYROIDAB" in the last 72 hours. Anemia Panel: No results for input(s): "VITAMINB12", "FOLATE", "FERRITIN", "TIBC", "IRON", "RETICCTPCT" in the last 72 hours. Urine  analysis:    Component Value Date/Time   COLORURINE YELLOW (A) 05/12/2022 1301   APPEARANCEUR HAZY (A) 05/12/2022 1301   APPEARANCEUR Clear 09/30/2014 1508   LABSPEC 1.011 05/12/2022 1301   LABSPEC 1.006 09/30/2014 1508   PHURINE 6.0 05/12/2022 1301   GLUCOSEU NEGATIVE 05/12/2022 1301  GLUCOSEU Negative 09/30/2014 1508   HGBUR MODERATE (A) 05/12/2022 1301   BILIRUBINUR NEGATIVE 05/12/2022 1301   BILIRUBINUR Negative 09/30/2014 1508   KETONESUR NEGATIVE 05/12/2022 1301   PROTEINUR 100 (A) 05/12/2022 1301   NITRITE NEGATIVE 05/12/2022 1301   LEUKOCYTESUR NEGATIVE 05/12/2022 1301   LEUKOCYTESUR 2+ 09/30/2014 1508   Sepsis Labs: @LABRCNTIP (procalcitonin:4,lacticidven:4) )No results found for this or any previous visit (from the past 240 hour(s)).   Radiological Exams on Admission: CT Head Wo Contrast  Result Date: 05/12/2022 CLINICAL DATA:  Mental status change, unknown cause EXAM: CT HEAD WITHOUT CONTRAST TECHNIQUE: Contiguous axial images were obtained from the base of the skull through the vertex without intravenous contrast. RADIATION DOSE REDUCTION: This exam was performed according to the departmental dose-optimization program which includes automated exposure control, adjustment of the mA and/or kV according to patient size and/or use of iterative reconstruction technique. COMPARISON:  MRI head April 13, 23. FINDINGS: Brain: No evidence of acute infarction, hemorrhage, hydrocephalus, extra-axial collection or mass lesion/mass effect. Similar mild cerebral atrophy. Vascular: No hyperdense vessel identified. Skull: No acute fracture. Sinuses/Orbits: Clear sinuses.  No acute orbital findings. Other: No mastoid effusions. IMPRESSION: No evidence of acute intracranial abnormality. Electronically Signed   By: Margaretha Sheffield M.D.   On: 05/12/2022 11:20   DG Hip Unilat W or Wo Pelvis 2-3 Views Right  Result Date: 05/12/2022 CLINICAL DATA:  Right hip pain right hip surgery in June. EXAM:  DG HIP (WITH OR WITHOUT PELVIS) 2-3V RIGHT COMPARISON:  Right hip radiograph April 08, 2022 FINDINGS: Prior bilateral total hip arthroplasties. Right hip arthroplasty is unremarkable without evidence of loosening or perihardware fracture. Similar appearance of the left hip arthroplasty dating back to April 06, 2022. Lumbar spondylosis. IMPRESSION: Right total hip arthroplasty without evidence of loosening or hardware fracture. Electronically Signed   By: Dahlia Bailiff M.D.   On: 05/12/2022 11:18      Assessment/Plan Principal Problem:   Acute metabolic encephalopathy Active Problems:   UTI (urinary tract infection)   Parkinson disease (H[facility])   Hypertension   Gout   Depression   Assessment and Plan: * Acute metabolic encephalopathy Etiology is not clear.  CT head negative.  No focal neurodeficit on physical examination.  Differential diagnosis include side effects of sedative and pain medications and possible UTI.  Patient is taking OxyContin 10 mg twice daily and Percocet 5/325 mg every 4 hours as needed.  Patient is also taking Neurontin.  Urinalysis showed possible UTI.  -Placed on MedSurg bed for position -Hold off OxyContin -Decrease frequency of Percocet from every 4 hours to every 8 hours as needed -Hold Neurontin medication neurology -Frequent neurochecks  UTI (urinary tract infection) Urinalysis with hazy appearance, negative leukocyte, with WBC 0-5, but urinalysis showed many bacteria, indicating possible UTI. -Rocephin is started in ED, will continue -Follow-up urine culture  Parkinson disease ([place]) -Sinemet  Hypertension - Patient is actually on midodrine for hypotension -As needed hydralazine  Gout - Continue allopurinol  Depression - Zoloft          DVT ppx:  SQ Lovenox  Code Status: Full code  Family Communication: Yes, patient's  son  at bed side.     Disposition Plan:  Anticipate discharge back to previous environment, SNF  Consults called:   none  Admission status and Level of care: Med-Surg:  for obs     Severity of Illness:  The appropriate patient status for this patient is OBSERVATION. Observation status is judged to be reasonable  and necessary in order to provide the required intensity of service to ensure the patient's safety. The patient's presenting symptoms, physical exam findings, and initial radiographic and laboratory data in the context of their medical condition is felt to place them at decreased risk for further clinical deterioration. Furthermore, it is anticipated that the patient will be medically stable for discharge from the hospital within 2 midnights of admission.        Date of Service 05/12/2022    Ivor Costa [facility]   If 7PM-7AM, please contact night-coverage [email or web address] 05/12/2022, 3:48 PM

## 2022-05-12 NOTE — Assessment & Plan Note (Signed)
Zoloft

## 2022-05-12 NOTE — Assessment & Plan Note (Signed)
Continue allopurinol 

## 2022-05-12 NOTE — ED Provider Notes (Signed)
[facility] Provider Note    Event Date/Time   First MD Initiated Contact with Patient 05/12/22 [phone]     (approximate)   History   Chief Complaint: Altered Mental Status   HPI  Haley Fuerstenberg is a 87 y.o. female with a history of RA, Parkinson's disease, hypertension, GERD who is brought to the ED today due to increased confusion and weakness.  She has been compliant with medications.  No falls.  Reports eating and drinking normally.  Denies any pain.  Son notes that her mental acuity has declined dramatically over the past several days.     Physical Exam   Triage Vital Signs: ED Triage Vitals  Enc Vitals Group     BP 05/12/22 0949 (!) 165/82     Pulse Rate 05/12/22 0949 75     Resp 05/12/22 0949 (!) 22     Temp 05/12/22 0949 98.7 F (37.1 C)     Temp Source 05/12/22 0949 Oral     SpO2 05/12/22 0945 94 %     Weight 05/12/22 0949 155 lb 14.4 oz (70.7 kg)     Height 05/12/22 0949 5' 3"  (1.6 m)     Head Circumference --      [facility] Flow --      Pain Score 05/12/22 0949 0     Pain Loc --      Pain Edu? --      Excl. in [place]? --     Most recent vital signs: Vitals:   05/12/22 1323 05/12/22 1356  BP: 109/82   Pulse: 66   Resp: 19   Temp:  98.4 F (36.9 C)  SpO2: 97%     General: Awake, no distress.  CV:  Good peripheral perfusion.  Regular rate and rhythm Resp:  Normal effort.  Clear to auscultation bilaterally Abd:  No distention.  Soft nontender Other:  Mild right hip tenderness.  PERRL, EOMI, cranial nerves II through XII intact.  No motor drift.   ED Results / Procedures / Treatments   Labs (all labs ordered are listed, but only abnormal results are displayed) Labs Reviewed  CBC - Abnormal; Notable for the following components:      Result Value   RBC 3.48 (*)    Hemoglobin 11.3 (*)    HCT 35.1 (*)    MCV 100.9 (*)    All other components within normal limits  URINALYSIS, ROUTINE W REFLEX MICROSCOPIC - Abnormal;  Notable for the following components:   Color, Urine YELLOW (*)    APPearance HAZY (*)    Hgb urine dipstick MODERATE (*)    Protein, ur 100 (*)    Bacteria, UA MANY (*)    All other components within normal limits  URINE CULTURE  COMPREHENSIVE METABOLIC PANEL  CBG MONITORING, ED     EKG Interpreted by me Normal sinus rhythm rate of 76.  Normal axis, normal intervals.  Poor R wave progression.  Normal ST segments and T waves.  Artifact in limb leads due to tremor   RADIOLOGY CT head interpreted by me, negative for intracranial hemorrhage.  Radiology report reviewed  X-ray right hip and pelvis negative for fracture.   PROCEDURES:  Procedures   MEDICATIONS ORDERED IN ED: Medications  lactated ringers bolus 1,000 mL (0 mLs Intravenous Stopped 05/12/22 1301)  cefTRIAXone (ROCEPHIN) 1 g in sodium chloride 0.9 % 100 mL IVPB (1 g Intravenous New Bag/Given 05/12/22 1352)     IMPRESSION / MDM /  ASSESSMENT AND PLAN / ED COURSE  I reviewed the triage vital signs and the nursing notes.                              Differential diagnosis includes, but is not limited to, dehydration, electrode abnormality, anemia, UTI, intracranial hemorrhage, right hip fracture  Patient's presentation is most consistent with acute presentation with potential threat to life or bodily function.  Patient presents with altered mental status without focal symptoms.  She appears dehydrated, will give IV fluid bolus.  X-ray right hip and CT head are unremarkable.  Labs are unremarkable as well except for urinalysis suggestive of UTI.  She does have pronounced generalized weakness in the setting of this acute illness making her at high risk for fall.  Additionally, she has evidence of delirium with confusion.  I have given IV Rocephin and added on urine culture.  We will plan to hospitalize for further management, to which the patient and her son at bedside agree.       FINAL CLINICAL IMPRESSION(S) / ED  DIAGNOSES   Final diagnoses:  Acute cystitis with hematuria  Confusion  Parkinson's disease ([place])     Rx / DC Orders   ED Discharge Orders     None        Note:  This document was prepared using Dragon voice recognition software and may include unintentional dictation errors.   Carrie Mew, MD 05/12/22 [phone]

## 2022-05-12 NOTE — Progress Notes (Signed)
Pt received from ED. She is oriented to self but can only state her first name. Pt disoriented to her birth date, place, current year, location, and situation.

## 2022-05-12 NOTE — Assessment & Plan Note (Signed)
-  Sinemet

## 2022-05-13 DIAGNOSIS — G20 Parkinson's disease: Secondary | ICD-10-CM | POA: Diagnosis present

## 2022-05-13 DIAGNOSIS — Z96643 Presence of artificial hip joint, bilateral: Secondary | ICD-10-CM | POA: Diagnosis present

## 2022-05-13 DIAGNOSIS — N390 Urinary tract infection, site not specified: Secondary | ICD-10-CM | POA: Diagnosis present

## 2022-05-13 DIAGNOSIS — Z96653 Presence of artificial knee joint, bilateral: Secondary | ICD-10-CM | POA: Diagnosis present

## 2022-05-13 DIAGNOSIS — N3000 Acute cystitis without hematuria: Secondary | ICD-10-CM | POA: Diagnosis not present

## 2022-05-13 DIAGNOSIS — Z853 Personal history of malignant neoplasm of breast: Secondary | ICD-10-CM | POA: Diagnosis not present

## 2022-05-13 DIAGNOSIS — K219 Gastro-esophageal reflux disease without esophagitis: Secondary | ICD-10-CM | POA: Diagnosis present

## 2022-05-13 DIAGNOSIS — Z8261 Family history of arthritis: Secondary | ICD-10-CM | POA: Diagnosis not present

## 2022-05-13 DIAGNOSIS — Z91041 Radiographic dye allergy status: Secondary | ICD-10-CM | POA: Diagnosis not present

## 2022-05-13 DIAGNOSIS — M069 Rheumatoid arthritis, unspecified: Secondary | ICD-10-CM | POA: Diagnosis present

## 2022-05-13 DIAGNOSIS — G9341 Metabolic encephalopathy: Secondary | ICD-10-CM | POA: Diagnosis present

## 2022-05-13 DIAGNOSIS — E785 Hyperlipidemia, unspecified: Secondary | ICD-10-CM | POA: Diagnosis present

## 2022-05-13 DIAGNOSIS — Z888 Allergy status to other drugs, medicaments and biological substances status: Secondary | ICD-10-CM | POA: Diagnosis not present

## 2022-05-13 DIAGNOSIS — Z8249 Family history of ischemic heart disease and other diseases of the circulatory system: Secondary | ICD-10-CM | POA: Diagnosis not present

## 2022-05-13 DIAGNOSIS — T465X5A Adverse effect of other antihypertensive drugs, initial encounter: Secondary | ICD-10-CM | POA: Diagnosis not present

## 2022-05-13 DIAGNOSIS — Z9841 Cataract extraction status, right eye: Secondary | ICD-10-CM | POA: Diagnosis not present

## 2022-05-13 DIAGNOSIS — R519 Headache, unspecified: Secondary | ICD-10-CM | POA: Diagnosis not present

## 2022-05-13 DIAGNOSIS — R410 Disorientation, unspecified: Secondary | ICD-10-CM | POA: Diagnosis present

## 2022-05-13 DIAGNOSIS — I10 Essential (primary) hypertension: Secondary | ICD-10-CM | POA: Diagnosis present

## 2022-05-13 DIAGNOSIS — F32A Depression, unspecified: Secondary | ICD-10-CM | POA: Diagnosis present

## 2022-05-13 DIAGNOSIS — Z9842 Cataract extraction status, left eye: Secondary | ICD-10-CM | POA: Diagnosis not present

## 2022-05-13 DIAGNOSIS — G629 Polyneuropathy, unspecified: Secondary | ICD-10-CM | POA: Diagnosis present

## 2022-05-13 DIAGNOSIS — Z79899 Other long term (current) drug therapy: Secondary | ICD-10-CM | POA: Diagnosis not present

## 2022-05-13 DIAGNOSIS — M109 Gout, unspecified: Secondary | ICD-10-CM | POA: Diagnosis present

## 2022-05-13 DIAGNOSIS — E538 Deficiency of other specified B group vitamins: Secondary | ICD-10-CM | POA: Diagnosis present

## 2022-05-13 DIAGNOSIS — Z923 Personal history of irradiation: Secondary | ICD-10-CM | POA: Diagnosis not present

## 2022-05-13 LAB — URINE CULTURE

## 2022-05-13 MED ORDER — SODIUM CHLORIDE 0.9 % IV SOLN: INTRAVENOUS | Status: AC

## 2022-05-13 NOTE — Hospital Course (Addendum)
87 year old female with past medical history of multiple hip dislocations Parkinson disease, hypertension, hyperlipidemia, GERD, gout, depression, hard of hearing, breast cancer status post right breast radiation therapy, rheumatoid arthritis, C. difficile colitis, prior GI bleeds who presented from SNF/rehab for evaluation of altered mental status.  [facility] admission from 6/28 - 6/26 for which you underwent surgical repair of a recurrent right hip dislocation.  At baseline, patient is normally alert oriented and clear minded.  On admission, she was oriented only to self.  She had no focal neurologic deficits or other symptoms.  Evaluation in the ED: Afebrile, no leukocytosis but urinalysis was hazy in appearance with many bacteria concerning for UTI.  Vitals were notable for respiratory rate 24 mm mildly soft BP of 109/82.  Right hip x-ray was negative for acute fracture or dislocation.  Patient was started on empiric Rocephin for possible UTI and admitted for further evaluation management.  Pain medication regimen was reduced and Neurontin held at time of admission.

## 2022-05-13 NOTE — Progress Notes (Signed)
Progress Note   Patient: Yvonne Singleton KKX:[id number] DOB: 04-01-1935 DOA: 05/12/2022     0 DOS: the patient was seen and examined on 05/13/2022   Brief hospital course: 87 year old female with past medical history of multiple hip dislocations Parkinson disease, hypertension, hyperlipidemia, GERD, gout, depression, hard of hearing, breast cancer status post right breast radiation therapy, rheumatoid arthritis, C. difficile colitis, prior GI bleeds who presented from SNF/rehab for evaluation of altered mental status.  Patient was discharged to [facility] after admission from 6/28 - 6/26 for which you underwent surgical repair of a recurrent right hip dislocation.  At baseline, patient is normally alert oriented and clear minded.  On admission, she was oriented only to self.  She had no focal neurologic deficits or other symptoms.  Evaluation in the ED: Afebrile, no leukocytosis but urinalysis was hazy in appearance with many bacteria concerning for UTI.  Vitals were notable for respiratory rate 24 mm mildly soft BP of 109/82.  Right hip x-ray was negative for acute fracture or dislocation.  Patient was started on empiric Rocephin for possible UTI and admitted for further evaluation management.  Pain medication regimen was reduced and Neurontin held at time of admission.  Assessment and Plan: * Acute metabolic encephalopathy Etiology is not clear, but suspect multifactorial due to likely UTI and polypharmacy given patient on pain medications and Neurontin.  CT head negative.  No focal neurodeficit on physical examination.  Patient is taking OxyContin 10 mg twice daily and Percocet 5/325 mg every 4 hours as needed, also on Neurontin.  Urinalysis showed possible UTI.  Started on empiric Rocephin on admission. -- Continue holding OxyContin for now -- Hold Neurontin -- Continue Percocet with frequency reduced from every 4 to every 8 hours -- Treat UTI as outlined -- Delirium precautions --  Neurochecks -- Check TSH, B12, vitamin D levels with a.m. labs  UTI (urinary tract infection) Urinalysis with hazy appearance, negative leukocyte, with WBC 0-5, but urinalysis showed many bacteria, indicating possible UTI. --Continue empiric Rocephin --Follow-up urine culture  Parkinson disease ([place]) -Sinemet  Hypertension Not on antihypertensives as outpatient, actually on midodrine for hypotension. --Continue midodrine with hold parameters for SBP above 130 or DBP above 90 --As needed hydralazine  Gout - Continue allopurinol  Depression - Zoloft        Subjective: Patient awake sitting up in bed, daughter at bedside when seen on rounds this morning.  Patient still remains very confused and not at her baseline.  Patient seems somewhat aware of this.  She and daughter report she does not typically lose her train of thought while speaking.  Daughter reports trying to get patient to drink water today but she is not taking in very much at all.  Very little urine output noted in the PureWick canister since early morning.  Patient endorses some mild lower mid abdominal discomfort.  Physical Exam: Vitals:   05/12/22 2037 05/13/22 0521 05/13/22 0827 05/13/22 1642  BP: (!) 164/83 (!) 140/109 (!) 176/77 139/69  Pulse: 61 (!) 58 65 63  Resp: 17 19 17 16   Temp: 98.8 F (37.1 C) 98.1 F (36.7 C) 98.9 F (37.2 C) 98.1 F (36.7 C)  TempSrc:      SpO2: 94% 95% 95% 94%  Weight:      Height:       General exam: awake, alert, no acute distress HEENT: atraumatic, clear conjunctiva, anicteric sclera, moist mucus membranes, hearing grossly normal  Respiratory system: CTAB, no wheezes, rales or rhonchi,  normal respiratory effort. Cardiovascular system: normal S1/S2, RRR, no pedal edema.   Gastrointestinal system: soft, nondistended abdomen, bowel sounds Central nervous system: A&O to self and hospital. no gross focal neurologic deficits, normal speech Extremities: moves all, no cyanosis,  normal tone Skin: dry, intact, normal temperature, no rashes seen on visualized skin Psychiatry: normal mood, congruent affect, abnormal judgement and insight, no apparent AH or VH   Data Reviewed:  Notable labs --  Hbg 11.3  Family Communication: daughter at bedside on rounds  Disposition: Status is: Inpatient Remains inpatient appropriate because: remains on IV antibiotics pending cultures and has persistent encephalopathy   Planned Discharge Destination: Skilled nursing facility    Time spent: 40 minutes  Author: Ezekiel Slocumb, DO 05/13/2022 5:32 PM  For on call review [email or web address].

## 2022-05-14 DIAGNOSIS — N3000 Acute cystitis without hematuria: Secondary | ICD-10-CM | POA: Diagnosis not present

## 2022-05-14 DIAGNOSIS — E538 Deficiency of other specified B group vitamins: Secondary | ICD-10-CM

## 2022-05-14 DIAGNOSIS — G9341 Metabolic encephalopathy: Secondary | ICD-10-CM | POA: Diagnosis not present

## 2022-05-14 LAB — BASIC METABOLIC PANEL
Anion gap: 7 (ref 5–15)
BUN: 12 mg/dL (ref 8–23)
CO2: 25 mmol/L (ref 22–32)
Calcium: 8.6 mg/dL — ABNORMAL LOW (ref 8.9–10.3)
Chloride: 109 mmol/L (ref 98–111)
Creatinine, Ser: 0.75 mg/dL (ref 0.44–1.00)
GFR, Estimated: >60 mL/min (ref >60)
Glucose, Bld: 84 mg/dL (ref 70–99)
Potassium: 3.6 mmol/L (ref 3.5–5.1)
Sodium: 141 mmol/L (ref 135–145)

## 2022-05-14 LAB — VITAMIN D 25 HYDROXY (VIT D DEFICIENCY, FRACTURES): Vit D, 25-Hydroxy: 38.35 ng/mL (ref 30–100)

## 2022-05-14 LAB — PHOSPHORUS: Phosphorus: 3.9 mg/dL (ref 2.5–4.6)

## 2022-05-14 LAB — TSH: TSH: 1.916 u[IU]/mL (ref 0.350–4.500)

## 2022-05-14 LAB — MAGNESIUM: Magnesium: 1.9 mg/dL (ref 1.7–2.4)

## 2022-05-14 LAB — VITAMIN B12: Vitamin B-12: 177 pg/mL — ABNORMAL LOW (ref 180–914)

## 2022-05-14 MED ORDER — AMLODIPINE BESYLATE 5 MG PO TABS
5.0000 mg | ORAL_TABLET | Freq: Every day | ORAL | Status: DC
  Administered 2022-05-15 – 2022-05-16 (×2): 5 mg via ORAL
  Filled 2022-05-14 (×2): qty 1

## 2022-05-14 MED ORDER — OXYCODONE HCL 5 MG PO TABS
5.0000 mg | ORAL_TABLET | Freq: Four times a day (QID) | ORAL | Status: DC | PRN
  Administered 2022-05-15: 5 mg via ORAL
  Filled 2022-05-14: qty 1

## 2022-05-14 MED ORDER — ACETAMINOPHEN 500 MG PO TABS
1000.0000 mg | ORAL_TABLET | Freq: Three times a day (TID) | ORAL | Status: DC
  Administered 2022-05-14 – 2022-05-17 (×9): 1000 mg via ORAL
  Filled 2022-05-14 (×9): qty 2

## 2022-05-14 MED ORDER — GABAPENTIN 100 MG PO CAPS
200.0000 mg | ORAL_CAPSULE | Freq: Two times a day (BID) | ORAL | Status: DC
  Administered 2022-05-14 – 2022-05-17 (×6): 200 mg via ORAL
  Filled 2022-05-14 (×6): qty 2

## 2022-05-14 MED ORDER — VITAMIN B-12 1000 MCG PO TABS
1000.0000 ug | ORAL_TABLET | Freq: Every day | ORAL | Status: DC
  Administered 2022-05-15 – 2022-05-17 (×3): 1000 ug via ORAL
  Filled 2022-05-14 (×3): qty 1

## 2022-05-14 MED ORDER — HYDRALAZINE HCL 50 MG PO TABS
25.0000 mg | ORAL_TABLET | Freq: Three times a day (TID) | ORAL | Status: DC | PRN
  Administered 2022-05-15 – 2022-05-16 (×2): 25 mg via ORAL
  Filled 2022-05-14 (×2): qty 1

## 2022-05-14 NOTE — Progress Notes (Signed)
Bladder scan with 221m shown in bladder.

## 2022-05-14 NOTE — Evaluation (Signed)
Occupational Therapy Evaluation Patient Details Name: Yvonne Singleton MRN: [id number] DOB: [birth year] Today's Date: 05/14/2022   History of Present Illness Pt is an 87 y/o F admitted on 05/12/22 after presenting to the ED from SNF/rehab for evaluation of AMS. In the ED, urinalysis was concerning for a UTI & pt was started on antibiotics for possible UTI & admitted for further evaluation. PMH: multiple hip dislocations, Parkinson's disease, HTN, HLD, GERD, gout, depression, HOH, breast CA s/p R breast radiation therapy, RA, C. diff colitis, GI bleeds   Clinical Impression   Pt was seen for OT evaluation this date. Prior to hospital admission, per chart, prior to hip sx pt was ambulatory with crutches ~10 years and MOD I for ADLs. Pt lives at independent living facility at [facility]. Pt is A&O x2. Pt presents to acute OT demonstrating impaired ADL performance and functional mobility 2/2 decreased activity tolerance and functional strength/ROM/balance deficits. Pt currently requires MIN A with multimodal cueing for sequencing and safe hand placement for STS. MIN A + RW with min vcs for sequencing/RW management for ADL t/f. CGA with min vcs for initiaiton and no UE support for grooming tasks in standing ~3 minutes - needs assistance with opening packages. SUPERVISION with min vcs for initiation for donning/doffing socks while seated. Pt would benefit from skilled OT to address noted impairments and functional limitations (see below for any additional details). Upon hospital discharge, recommend STR to maximize pt safety and return to PLOF.      Recommendations for follow up therapy are one component of a multi-disciplinary discharge planning process, led by the attending physician.  Recommendations may be updated based on patient status, additional functional criteria and insurance authorization.   Follow Up Recommendations  Skilled nursing-short term rehab (<3 hours/day)    Assistance  Recommended at Discharge Frequent or constant Supervision/Assistance  Patient can return home with the following A little help with walking and/or transfers;A lot of help with bathing/dressing/bathroom;Assistance with cooking/housework;Direct supervision/assist for medications management;Assist for transportation;Help with stairs or ramp for entrance    Functional Status Assessment  Patient has had a recent decline in their functional status and demonstrates the ability to make significant improvements in function in a reasonable and predictable amount of time.  Equipment Recommendations  Other (comment) (defer to next venue of care)    Recommendations for Other Services       Precautions / Restrictions Precautions Precautions: Fall Restrictions Weight Bearing Restrictions: Yes RLE Weight Bearing: Weight bearing as tolerated      Mobility Bed Mobility               General bed mobility comments: NT, pt in recliner at start and end of session    Transfers Overall transfer level: Needs assistance Equipment used: Rolling walker (2 wheels) Transfers: Sit to/from Stand Sit to Stand: Min assist                  Balance Overall balance assessment: Needs assistance Sitting-balance support: Feet supported, No upper extremity supported Sitting balance-Leahy Scale: Fair     Standing balance support: During functional activity, No upper extremity supported Standing balance-Leahy Scale: Fair                             ADL either performed or assessed with clinical judgement   ADL Overall ADL's : Needs assistance/impaired  General ADL Comments: MIN A + RW with min vcs for sequencing/RW management for ADL t/f. CGA with min vcs for initiaiton and no UE support for grooming tasks in standing ~3 minutes - needs assistance with opening packages. SUPERVISION with min vcs for initiation for donning/doffing socks  while seated.      Pertinent Vitals/Pain Pain Assessment Pain Assessment: Faces Faces Pain Scale: No hurt     Hand Dominance     Extremity/Trunk Assessment Upper Extremity Assessment Upper Extremity Assessment: Generalized weakness   Lower Extremity Assessment Lower Extremity Assessment: Generalized weakness   Cervical / Trunk Assessment Cervical / Trunk Assessment:  (hx of Parkinson's with tremors but noted to be worse today in BUE & head/trunk)   Communication Communication Communication: HOH   Cognition Arousal/Alertness: Awake/alert Behavior During Therapy: WFL for tasks assessed/performed Overall Cognitive Status: Impaired/Different from baseline Area of Impairment: Orientation, Following commands, Memory, Attention, Problem solving                 Orientation Level: Disoriented to, Situation, Place Current Attention Level: Sustained Memory: Decreased short-term memory Following Commands: Follows one step commands inconsistently     Problem Solving: Decreased initiation                  Home Living Family/patient expects to be discharged to:: Skilled nursing facility Living Arrangements: Alone   Type of Home: Other(Comment) (Independent Living at [facility]) Home Access: Level entry     Home Layout: One level               Home Equipment: Crutches;Grab bars - tub/shower;Shower seat;Grab bars - toilet   Additional Comments: pt lives at [facility]; was receiving therapy service (PT and OT) through [facility] prior to hip sx in June 2023      Prior Functioning/Environment Prior Level of Function : Independent/Modified Independent;Needs assist             Mobility Comments: Pt has been ambulatory with crutches for ~10 years, reporting RW causes increased back pain. Pt was working on ambulation with RW at rehab at [facility] prior to this admission. ADLs Comments: Pt was mod I in ADL prior to hip sx (per  chart)        OT Problem List: Decreased strength;Decreased range of motion;Decreased activity tolerance;Decreased safety awareness;Decreased cognition      OT Treatment/Interventions: Self-care/ADL training;Therapeutic exercise;Energy conservation;DME and/or AE instruction;Therapeutic activities;Patient/family education;Balance training    OT Goals(Current goals can be found in the care plan section) Acute Rehab OT Goals Patient Stated Goal: to go to STR OT Goal Formulation: With patient/family Time For Goal Achievement: 05/28/22 Potential to Achieve Goals: Good ADL Goals Pt Will Perform Grooming: with set-up;standing (will tolerate >10 minutes of standing with LRAD and min vcs) Pt Will Perform Lower Body Dressing: with modified independence;sit to/from stand (with LRAD and min vcs) Pt Will Transfer to Toilet: with modified independence;ambulating;regular height toilet (with LRAD and min vcs)  OT Frequency: Min 2X/week       AM-PAC OT "6 Clicks" Daily Activity     Outcome Measure Help from another person eating meals?: None Help from another person taking care of personal grooming?: A Little Help from another person toileting, which includes using toliet, bedpan, or urinal?: A Lot Help from another person bathing (including washing, rinsing, drying)?: A Lot Help from another person to put on and taking off regular upper body clothing?: A Little Help from another person to put on  and taking off regular lower body clothing?: A Little 6 Click Score: 17   End of Session Equipment Utilized During Treatment: Gait belt;Rolling walker (2 wheels)  Activity Tolerance: Patient tolerated treatment well Patient left: in chair;with call bell/phone within reach;with chair alarm set;with family/visitor present  OT Visit Diagnosis: Unsteadiness on feet (R26.81);Muscle weakness (generalized) (M62.81)                Time: 0981-1914 OT Time Calculation (min): 16 min Charges:  OT General  Charges $OT Visit: 1 Visit OT Evaluation $OT Eval Moderate Complexity: 1 Mod  [employer], OTDS  [employer] 05/14/2022, 3:51 PM

## 2022-05-14 NOTE — TOC Progression Note (Signed)
Transition of Care [facility]) - Progression Note    Patient Details  Name: Yvonne Singleton MRN: [id number] Date of Birth: 1935-06-24  Transition of [facility]) CM/SW [place], RN Phone Number: 05/14/2022, 4:19 PM  Clinical Narrative:   Patient is from [facility] for SNF.  As per Tammy at facility, patient is eligible to return.  SNF request sent to [facility] for review.         Expected Discharge Plan and Services                                                 Social Determinants of Health (SDOH) Interventions    Readmission Risk Interventions     No data to display

## 2022-05-14 NOTE — Assessment & Plan Note (Signed)
S/P admission 6/20--26/2023, underwent R total hip replacement 04/08/22 with Dr. Rudene Christians.  Seen at follow up on 04/22/2022. --Pain control as outlined above --PT/OT --Anticipate return to SNF/rehab

## 2022-05-14 NOTE — Evaluation (Signed)
Physical Therapy Evaluation Patient Details Name: Yvonne Singleton MRN: [id number] DOB: 20-Jul-1935 Today's Date: 05/14/2022  History of Present Illness  Pt is an 87 y/o F admitted on 05/12/22 after presenting to the ED from SNF/rehab for evaluation of AMS. In the ED, urinalysis was concerning for a UTI & pt was started on antibiotics for possible UTI & admitted for further evaluation. PMH: multiple hip dislocations, Parkinson's disease, HTN, HLD, GERD, gout, depression, HOH, breast CA s/p R breast radiation therapy, RA, C. diff colitis, GI bleeds  Clinical Impression  MD cleared pt for participation in PT in setting of elevated systolic BP & nurse administered BP meds at beginning of session. Pt seen for PT evaluation with pt's son present for session. Pt is [employer] self & city, follows commands with extra time. Pt requires mod assist for supine>sit & min assist for STS with RW. Pt is limited by R hip/R lower abdominal pain -- nurse administered meds at beginning of session. Pt's son reports pt was using RW at [facility] vs crutches she's used for the past ~10 years. Pt is able to ambulate to door & back of room with RW & min assist with adequate steps BLE but pt begins to drag RLE about half way through. Pt also requires assistance/cuing for RW management when turning. Pt would benefit from STR upon d/c to maximize independence with functional mobility & reduce fall risk prior to return home. Will continue to follow pt acutely to address balance, strengthening, and gait with LRAD.    Recommendations for follow up therapy are one component of a multi-disciplinary discharge planning process, led by the attending physician.  Recommendations may be updated based on patient status, additional functional criteria and insurance authorization.  Follow Up Recommendations Skilled nursing-short term rehab (<3 hours/day) Can patient physically be transported by private vehicle: No    Assistance Recommended at  Discharge Frequent or constant Supervision/Assistance  Patient can return home with the following  A lot of help with walking and/or transfers;A lot of help with bathing/dressing/bathroom;Assist for transportation;Assistance with cooking/housework;Help with stairs or ramp for entrance;Direct supervision/assist for medications management    Equipment Recommendations None recommended by PT  Recommendations for Other Services       Functional Status Assessment Patient has had a recent decline in their functional status and demonstrates the ability to make significant improvements in function in a reasonable and predictable amount of time.     Precautions / Restrictions Precautions Precautions: Fall Restrictions Weight Bearing Restrictions: Yes RLE Weight Bearing: Weight bearing as tolerated      Mobility  Bed Mobility Overal bed mobility: Needs Assistance Bed Mobility: Supine to Sit     Supine to sit: Mod assist, HOB elevated     General bed mobility comments: assistance to move RLE to EOB & upright trunk even with use of bed rails & HOB elevated    Transfers Overall transfer level: Needs assistance Equipment used: Rolling walker (2 wheels) Transfers: Sit to/from Stand, Bed to chair/wheelchair/BSC Sit to Stand: Min assist, Min guard   Step pivot transfers: Min assist       General transfer comment: STS from EOB & recliner, cuing for hand placement to reach back before transferring stand>sit    Ambulation/Gait Ambulation/Gait assistance: Min assist Gait Distance (Feet): 15 Feet Assistive device: Rolling walker (2 wheels) Gait Pattern/deviations: Decreased step length - right, Decreased step length - left, Decreased stride length, Decreased weight shift to right Gait velocity: decreased     General  Gait Details: Pt initially with adequate foot clearance BLE but as gait progressed pt began dragging RLE more (decreased foot clearance, decreased heel strike, decreased  step length). Pt required assistance to turn RW.  Stairs            Wheelchair Mobility    Modified Rankin (Stroke Patients Only)       Balance Overall balance assessment: Needs assistance Sitting-balance support: Feet supported, Bilateral upper extremity supported Sitting balance-Leahy Scale: Fair Sitting balance - Comments: supervision static sitting   Standing balance support: During functional activity, Bilateral upper extremity supported Standing balance-Leahy Scale: Fair                               Pertinent Vitals/Pain Pain Assessment Pain Assessment: Faces Faces Pain Scale: Hurts even more Pain Location: R hip/R lower abdomen Pain Descriptors / Indicators: Discomfort Pain Intervention(s): Monitored during session, Repositioned, RN gave pain meds during session    [place] expects to be discharged to:: Skilled nursing facility Living Arrangements: Alone   Type of Home:  (Independent Living at [facility]) Home Access: Level entry       Home Layout: One level Home Equipment: Crutches;Grab bars - tub/shower;Shower seat;Grab bars - toilet Additional Comments: pt lives at [facility]; was receiving therapy service (PT and OT) through [facility] prior to hip sx in June 2023    Prior Function               Mobility Comments: Pt has been ambulatory with crutches for ~10 years, reporting RW causes increased back pain. Pt was working on ambulation with RW at rehab at [facility] prior to this admission. ADLs Comments: Pt was mod I in ADL prior to hip sx (per chart)     Hand Dominance        Extremity/Trunk Assessment   Upper Extremity Assessment Upper Extremity Assessment: Generalized weakness    Lower Extremity Assessment Lower Extremity Assessment: Generalized weakness (3-/5 RLE knee extension in sitting, 3/5 LLE knee extension in sitting)    Cervical / Trunk Assessment Cervical / Trunk  Assessment:  (hx of Parkinson's with tremors but noted to be worse today in BUE & head/trunk)  Communication      Cognition Arousal/Alertness: Awake/alert Behavior During Therapy: WFL for tasks assessed/performed Overall Cognitive Status: Impaired/Different from baseline Area of Impairment: Orientation, Memory, Attention, Following commands, Awareness, Problem solving                 Orientation Level: Disoriented to, Situation, Time (recalls being at [facility] & having hip sx but unaware of being at [facility] 2/2 AMS) Current Attention Level: Sustained Memory: Decreased short-term memory Following Commands: Follows one step commands consistently, Follows one step commands with increased time   Awareness: Anticipatory, Emergent Problem Solving: Decreased initiation          General Comments      Exercises     Assessment/Plan    PT Assessment Patient needs continued PT services  PT Problem List Decreased strength;Pain;Decreased range of motion;Decreased cognition;Decreased activity tolerance;Decreased safety awareness;Decreased balance;Decreased mobility;Decreased knowledge of precautions       PT Treatment Interventions Therapeutic exercise;DME instruction;Balance training;Gait training;Neuromuscular re-education;Functional mobility training;Therapeutic activities;Patient/family education;Modalities;Cognitive remediation    PT Goals (Current goals can be found in the Care Plan section)  Acute Rehab PT Goals Patient Stated Goal: get better PT Goal Formulation: With patient/family Time For Goal Achievement: 05/28/22 Potential  to Achieve Goals: Good    Frequency Min 2X/week     Co-evaluation               AM-PAC PT "6 Clicks" Mobility  Outcome Measure Help needed turning from your back to your side while in a flat bed without using bedrails?: A Little Help needed moving from lying on your back to sitting on the side of a flat bed without using bedrails?: A Lot Help  needed moving to and from a bed to a chair (including a wheelchair)?: A Little Help needed standing up from a chair using your arms (e.g., wheelchair or bedside chair)?: A Little Help needed to walk in hospital room?: A Lot Help needed climbing 3-5 steps with a railing? : A Lot 6 Click Score: 15    End of Session   Activity Tolerance: Patient tolerated treatment well Patient left: in chair;with chair alarm set;with call bell/phone within reach;with family/visitor present Nurse Communication: Mobility status PT Visit Diagnosis: Unsteadiness on feet (R26.81);Difficulty in walking, not elsewhere classified (R26.2);Muscle weakness (generalized) (M62.81);Pain Pain - Right/Left: Right Pain - part of body: Hip    Time: 0388-8280 PT Time Calculation (min) (ACUTE ONLY): 21 min   Charges:   PT Evaluation $PT Eval Moderate Complexity: [place], PT, DPT 05/14/22, 1:45 PM   Waunita Schooner 05/14/2022, 1:43 PM

## 2022-05-14 NOTE — Progress Notes (Addendum)
Progress Note   Patient: Yvonne Singleton QJF:[id number] DOB: 10/24/1934 DOA: 05/12/2022     1 DOS: the patient was seen and examined on 05/14/2022   Brief hospital course: 87 year old female with past medical history of multiple hip dislocations Parkinson disease, hypertension, hyperlipidemia, GERD, gout, depression, hard of hearing, breast cancer status post right breast radiation therapy, rheumatoid arthritis, C. difficile colitis, prior GI bleeds who presented from SNF/rehab for evaluation of altered mental status.  Patient was discharged to [facility] after admission from 6/28 - 6/26 for which you underwent surgical repair of a recurrent right hip dislocation.  At baseline, patient is normally alert oriented and clear minded.  On admission, she was oriented only to self.  She had no focal neurologic deficits or other symptoms.  Evaluation in the ED: Afebrile, no leukocytosis but urinalysis was hazy in appearance with many bacteria concerning for UTI.  Vitals were notable for respiratory rate 24 mm mildly soft BP of 109/82.  Right hip x-ray was negative for acute fracture or dislocation.  Patient was started on empiric Rocephin for possible UTI and admitted for further evaluation management.  Pain medication regimen was reduced and Neurontin held at time of admission.  Assessment and Plan: * Acute metabolic encephalopathy Etiology is not clear, but suspect multifactorial due to likely UTI and polypharmacy given patient on pain medications and Neurontin.  CT head negative.  No focal neurodeficit on physical examination.  Patient is taking OxyContin 10 mg twice daily and Percocet 5/325 mg every 4 hours as needed, also on Neurontin.  Urinalysis showed possible UTI.  Started on empiric Rocephin on admission. TSH normal.  7/28: not yet at baseline, but improving.  Oriented today, speaking better but still forgets what she's saying and difficulty recalling recent events or symptoms including  hip pain (not typical for her). Son at bedside denies any prior memory issues.  -- Hold OxyContin (by chart review, unclear when this was started or by whom) - discontinue -- Resume Neurontin (200 mg BID, on 300 BID PTA) -- D/C Percocet -- Start scheduled Tylenol 1000 mg TID -- Start oxycodone IR 5 mg q6h PRN -- Treat UTI as outlined -- Delirium precautions -- Neurochecks -- Pending B12, vitamin D levels   Recurrent dislocation, right hip S/P admission 6/20--26/2023, underwent R total hip replacement 04/08/22 with Dr. Rudene Christians.  Seen at follow up on 04/22/2022. --Pain control as outlined above --PT/OT --Anticipate return to SNF/rehab  UTI (urinary tract infection) Urinalysis with hazy appearance, negative leukocyte, with WBC 0-5, but urinalysis showed many bacteria, indicating possible UTI. Urine culture contaminated, grew multiple species. --Send repeat urine cutlure --Continue empiric Rocephin given clinically improving --Stop IV fluids (started yesterday for low urine output and poor PO intake with AMS) --Bladder scan PRN if low UO  Parkinson disease (HCC) -Sinemet  Hypertension Not on antihypertensives as outpatient, actually on midodrine for hypotension. --Continue midodrine with hold parameters for SBP above 130 or DBP above 90 --Midodrine not given since parameters added and BP remains quite elevated --As needed PO hydralazine --Adjusted pain regimen as outlined --Start amlodipine 5 mg daily tomorrow if BP remains elevated  Gout - Continue allopurinol  Depression - Zoloft  Vitamin B12 deficiency B12 level checked for evaluation of AMS, slightly low at 177. --start oral B12 supplement        Subjective: Patient awake sitting up in bed, son at bedside when seen today.  She reports feeling okay.  She continues to have some difficulty putting her  thoughts into words, but this is better.  She is more oriented and better speaking today.  Still has poor recollection of  recent symptoms or events - did not recall having a lot of pain with patient care / hygiene with NT earlier this AM, does remember surgery and being at rehab since surgery.  She reports uncontrolled neuropathy.  Currently no hip pain.  NT at bedside reported rolling patient on that side caused great deal of pain for her.  Son agrees pt not at baseline yet and says no history of any memory issues or cognitive decline prior to recent days.  Physical Exam: Vitals:   05/14/22 0523 05/14/22 0814 05/14/22 1258 05/14/22 1532  BP: (!) 159/90 (!) 184/92 (!) 181/88 (!) 158/96  Pulse: 63 75 71 88  Resp: 16 19 18 15   Temp: 99 F (37.2 C) 97.6 F (36.4 C)  98.3 F (36.8 C)  TempSrc: Oral Oral  Oral  SpO2: 94% 99% 98% 99%  Weight:      Height:       General exam: awake, alert, no acute distress, more talkative HEENT: wearing glasses, moist mucus membranes, hearing grossly normal  Respiratory system: CTAB, no wheezes, rales or rhonchi, normal respiratory effort. Cardiovascular system: normal S1/S2, RRR, no pedal edema.   Gastrointestinal system: soft, nondistended and non-tender Central nervous system: A&O x3 today improved no gross focal neurologic deficits, normal speech Extremities: no edema, no cyanosis, normal tone Skin: dry, intact, normal temperature Psychiatry: normal mood, congruent affect, abnormal judgement and insight, no apparent AH or VH   Data Reviewed:  Notable labs --  Ca 8.6 otherwise normal BMP, vitamin B12 slightly low at 177.  Vitamin D level normal.  TSH normal.  Family Communication: son at bedside on rounds  Disposition: Status is: Inpatient Remains inpatient appropriate because: remains on IV antibiotics pending cultures and has persistent encephalopathy   Planned Discharge Destination: Skilled nursing facility    Time spent: 40 minutes  Author: Ezekiel Slocumb, DO 05/14/2022 6:29 PM  For on call review [email or web address].

## 2022-05-14 NOTE — NC FL2 (Signed)
[place] LEVEL OF CARE SCREENING TOOL     IDENTIFICATION  Patient Name: Yvonne Singleton Birthdate: 1935/03/19 Sex: female Admission Date (Current Location): 05/12/2022  [facility] and [place] Number:  Engineering geologist and Address:  [facility], [address] [place], [place] [address]      Provider Number: [id number]  Attending Physician Name and Address:  Ezekiel Slocumb, DO  Relative Name and Phone Number:  Fraidy, Mccarrick (Son)   [phone] [facility])    Current Level of Care: Hospital Recommended Level of Care: [place] Prior Approval Number:    Date Approved/Denied:   PASRR Number: [id number]  Discharge Plan: SNF    Current Diagnoses: Patient Active Problem List   Diagnosis Date Noted   Acute metabolic encephalopathy 75/88/3254   UTI (urinary tract infection) 05/12/2022   Recurrent dislocation, right hip 04/06/2022   Gout 04/06/2022   Depression 04/06/2022   Paraesophageal hernia    Gastritis without bleeding    Regional enteritis of small bowel ([place]) 10/24/2018   Clostridium difficile infection 10/24/2018   Lower extremity edema 06/27/2018   Hyperlipidemia 05/30/2017   Hypertension 05/30/2017   GERD (gastroesophageal reflux disease) 05/30/2017   Parkinson disease ([place]) 10/29/2016   History of bilateral hip replacements 05/28/2015   Spinal stenosis, lumbar region, with neurogenic claudication 05/28/2015   DDD (degenerative disc disease), lumbar 03/13/2015   Lumbar post-laminectomy syndrome 03/13/2015   Degenerative cervical disc 03/13/2015   Status post bilateral total hip replacement 03/13/2015   Sacroiliac joint dysfunction 03/13/2015   History of surgery on upper extremity 03/13/2015   DJD of shoulder 03/13/2015   Rheumatoid arthritis ([place]) 02/17/2015    Orientation RESPIRATION BLADDER Height & Weight     Self (Memory impairment)  Normal Incontinent Weight: 70.7 kg Height:  5' 3"   (160 cm)  BEHAVIORAL SYMPTOMS/MOOD NEUROLOGICAL BOWEL NUTRITION STATUS      Incontinent Diet (Heart)  AMBULATORY STATUS COMMUNICATION OF NEEDS Skin   Limited Assist Verbally Skin abrasions (abrasion R hip)                       Personal Care Assistance Level of Assistance  Bathing, Feeding, Dressing Bathing Assistance: Limited assistance Feeding assistance: Limited assistance Dressing Assistance: Limited assistance     Functional Limitations Info  Sight, Hearing, Speech Sight Info: Impaired Hearing Info: Impaired Speech Info: Adequate    SPECIAL CARE FACTORS FREQUENCY  PT (By licensed PT), OT (By licensed OT)     PT Frequency: Min 5x weekly OT Frequency: Min 5x weekly            Contractures Contractures Info: Not present    Additional Factors Info  Code Status, Allergies Code Status Info: FULL CODE Allergies Info: Trospium Digoxin  Iodinated Contrast Media    Streptomycin           Current Medications (05/14/2022):  This is the current hospital active medication list Current Facility-Administered Medications  Medication Dose Route Frequency Provider Last Rate Last Admin   acetaminophen (TYLENOL) tablet 650 mg  650 mg Oral Q6H PRN Ivor Costa, MD   650 mg at 05/13/22 1233   allopurinol (ZYLOPRIM) tablet 200 mg  200 mg Oral QHS Ivor Costa, MD   200 mg at 05/13/22 2122   B-complex with vitamin C tablet 1 tablet  1 tablet Oral Daily Ivor Costa, MD   1 tablet at 05/14/22 0916   calcium-vitamin D (OSCAL WITH D) 500-5 MG-MCG per tablet  1 tablet  1 tablet Oral BID Ivor Costa, MD   1 tablet at 05/14/22 0916   carbidopa-levodopa (SINEMET CR) 50-200 MG per tablet controlled release 1 tablet  1 tablet Oral BID Ivor Costa, MD   1 tablet at 05/14/22 0916   cefTRIAXone (ROCEPHIN) 1 g in sodium chloride 0.9 % 100 mL IVPB  1 g Intravenous Q24H Ivor Costa, MD 200 mL/hr at 05/14/22 1300 1 g at 05/14/22 1300   cholecalciferol (VITAMIN D3) 25 MCG (1000 UNIT) tablet 2,000 Units   2,000 Units Oral Daily Ivor Costa, MD   2,000 Units at 05/14/22 0915   clotrimazole (LOTRIMIN) 1 % cream   Topical BID Ivor Costa, MD   Given at 05/14/22 0920   docusate sodium (COLACE) capsule 100 mg  100 mg Oral BID Ivor Costa, MD   100 mg at 05/14/22 0916   enoxaparin (LOVENOX) injection 40 mg  40 mg Subcutaneous Q24H Ivor Costa, MD   40 mg at 05/13/22 2122   feeding supplement (ENSURE ENLIVE / ENSURE PLUS) liquid 237 mL  237 mL Oral BID BM Ivor Costa, MD   237 mL at 05/14/22 1334   fesoterodine (TOVIAZ) tablet 4 mg  4 mg Oral Daily Ivor Costa, MD   4 mg at 05/14/22 6213   hydrALAZINE (APRESOLINE) injection 5 mg  5 mg Intravenous Q2H PRN Ivor Costa, MD   5 mg at 05/14/22 1311   latanoprost (XALATAN) 0.005 % ophthalmic solution 1 drop  1 drop Both Eyes QHS Ivor Costa, MD       lidocaine (LIDODERM) 5 % 1 patch  1 patch Transdermal Daily PRN Ivor Costa, MD       midodrine (PROAMATINE) tablet 2.5 mg  2.5 mg Oral TID WC Nicole Kindred A, DO   2.5 mg at 05/13/22 1213   mirabegron ER (MYRBETRIQ) tablet 25 mg  25 mg Oral Daily Ivor Costa, MD   25 mg at 05/14/22 0916   ondansetron (ZOFRAN) injection 4 mg  4 mg Intravenous Q8H PRN Ivor Costa, MD       oxyCODONE-acetaminophen (PERCOCET/ROXICET) 5-325 MG per tablet 1 tablet  1 tablet Oral Q8H PRN Ivor Costa, MD   1 tablet at 05/14/22 1258   pantoprazole (PROTONIX) EC tablet 40 mg  40 mg Oral BID AC Ivor Costa, MD   40 mg at 05/14/22 0915   polyethylene glycol (MIRALAX / GLYCOLAX) packet 17 g  17 g Oral BID Ivor Costa, MD   17 g at 05/13/22 2123   sertraline (ZOLOFT) tablet 100 mg  100 mg Oral Daily Ivor Costa, MD   100 mg at 05/14/22 0865     Discharge Medications: Please see discharge summary for a list of discharge medications.  Relevant Imaging Results:  Relevant Lab Results:   Additional Information SS# [id number]  Pete Pelt, RN

## 2022-05-14 NOTE — Assessment & Plan Note (Signed)
B12 level checked for evaluation of AMS, slightly low at 177. --start oral B12 supplement

## 2022-05-15 DIAGNOSIS — G9341 Metabolic encephalopathy: Secondary | ICD-10-CM | POA: Diagnosis not present

## 2022-05-15 NOTE — Progress Notes (Addendum)
Progress Note   Patient: Yvonne Singleton PTW:[id number] DOB: Feb 14, 1935 DOA: 05/12/2022     2 DOS: the patient was seen and examined on 05/15/2022   Brief hospital course: 87 year old female with past medical history of multiple hip dislocations Parkinson disease, hypertension, hyperlipidemia, GERD, gout, depression, hard of hearing, breast cancer status post right breast radiation therapy, rheumatoid arthritis, C. difficile colitis, prior GI bleeds who presented from SNF/rehab for evaluation of altered mental status.  Patient was discharged to [facility] after admission from 6/28 - 6/26 for which you underwent surgical repair of a recurrent right hip dislocation.  At baseline, patient is normally alert oriented and clear minded.  On admission, she was oriented only to self.  She had no focal neurologic deficits or other symptoms.  Evaluation in the ED: Afebrile, no leukocytosis but urinalysis was hazy in appearance with many bacteria concerning for UTI.  Vitals were notable for respiratory rate 24 mm mildly soft BP of 109/82.  Right hip x-ray was negative for acute fracture or dislocation.  Patient was started on empiric Rocephin for possible UTI and admitted for further evaluation management.  Pain medication regimen was reduced and Neurontin held at time of admission.  Assessment and Plan: * Acute metabolic encephalopathy Suspect multifactorial due to likely UTI and polypharmacy given patient on pain medications and Neurontin.  CT head negative.  No focal neurodeficit on physical examination.  Patient is taking OxyContin 10 mg twice daily and Percocet 5/325 mg every 4 hours as needed, also on Neurontin.  Urinalysis showed possible UTI.  Started on empiric Rocephin on admission. TSH normal.  Vitamin B12 a bit low.  Vitamin D normal.  7/28: not yet at baseline, but improving.  Oriented today, speaking better but still forgets what she's saying and difficulty recalling recent events or  symptoms including hip pain (not typical for her). Son at bedside denies any prior memory issues.  -- Stop OxyContin (by chart review, unclear when this was started or by whom) - discontinue -- Stop Percocet -- Resume Neurontin (200 mg BID, on 300 BID PTA) -- Continue scheduled Tylenol 1000 mg TID -- Continue oxycodone IR 5 mg q6h PRN -- Treat UTI as outlined -- Delirium precautions -- Neurochecks -- Start vitamin B12 supplement  Recurrent dislocation, right hip S/P admission 6/20--26/2023, underwent R total hip replacement 04/08/22 with Dr. Rudene Christians.  Seen at follow up on 04/22/2022. --Pain control as outlined above --PT/OT --Anticipate return to SNF/rehab  UTI (urinary tract infection) Urinalysis with hazy appearance, negative leukocyte, with WBC 0-5, but urinalysis showed many bacteria, indicating possible UTI. Urine culture contaminated, grew multiple species. --Send repeat urine cutlure --Continue empiric Rocephin given clinically improving --Stop IV fluids (started yesterday for low urine output and poor PO intake with AMS) --Bladder scan PRN if low UO  Parkinson disease (HCC) -Sinemet  Hypertension Not on antihypertensives as outpatient, actually on midodrine for hypotension. --Continue midodrine with hold parameters for SBP above 130 or DBP above 90 --Midodrine not given since parameters added and BP remains quite elevated --As needed PO hydralazine --Adjusted pain regimen as outlined --Start amlodipine 5 mg daily tomorrow if BP remains elevated  Gout - Continue allopurinol  Depression - Zoloft  Vitamin B12 deficiency B12 level checked for evaluation of AMS, slightly low at 177. --start oral B12 supplement        Subjective: Patient awake sitting up in bed, son at bedside when seen today.  She reports feeling better, that her thinking is clearing up.  She is able to carry on conversation much better, further improved from yesterday.  Son at bedside reports she  seems much more herself and nearing baseline.  She reports hip pain is controlled.  Discussed changes in medications.  She cannot recall when or who started Oxycontin, agreeable to use scheduled Tylenol and PRN oxycodone without long-acting pain medicine.  Resume gabapentin and she denies side effects, mental status continues to improve.    Physical Exam: Vitals:   05/15/22 0605 05/15/22 0740 05/15/22 1316 05/15/22 1536  BP: (!) 168/85 (!) 168/85 (!) 173/76 (!) 147/77  Pulse: 77 77 84 80  Resp: 18 18 18 20   Temp: 98.2 F (36.8 C) 98.3 F (36.8 C)  98.7 F (37.1 C)  TempSrc:      SpO2: 99% 95% 96% 93%  Weight:      Height:       General exam: awake, alert, no acute distress, mentation further improved since yesterday, conversational HEENT: wearing glasses, moist mucus membranes, hearing grossly normal  Respiratory system: lungs clear, on room air, normal respiratory effort. Cardiovascular system: RRR, no peripheral edema.   Central nervous system: A&O x3 today improved no gross focal neurologic deficits, normal speech Extremities: no edema, no cyanosis, normal tone Skin: dry, intact, normal temperature Psychiatry: normal mood, congruent affect, abnormal judgement and insight, no apparent AH or VH   Data Reviewed: No new labs today. Repeat urine culture pending.  Family Communication: son at bedside on rounds  Disposition: Status is: Inpatient Remains inpatient appropriate because: remains on IV antibiotics for UTI with repeat culture pending; will return to [facility] when [facility] can re[facility].  Expect d/c tomorrow or Monday.   Planned Discharge Destination: Skilled nursing facility    Time spent: 35 minutes  Author: Ezekiel Slocumb, DO 05/15/2022 5:24 PM  For on call review [email or web address].

## 2022-05-16 DIAGNOSIS — R519 Headache, unspecified: Secondary | ICD-10-CM | POA: Clinically undetermined

## 2022-05-16 DIAGNOSIS — I10 Essential (primary) hypertension: Secondary | ICD-10-CM | POA: Diagnosis not present

## 2022-05-16 DIAGNOSIS — N3000 Acute cystitis without hematuria: Secondary | ICD-10-CM | POA: Diagnosis not present

## 2022-05-16 DIAGNOSIS — G9341 Metabolic encephalopathy: Secondary | ICD-10-CM | POA: Diagnosis not present

## 2022-05-16 HISTORY — DX: Headache, unspecified: R51.9

## 2022-05-16 LAB — URINE CULTURE: Culture: NO GROWTH

## 2022-05-16 LAB — BASIC METABOLIC PANEL
Anion gap: 5 (ref 5–15)
BUN: 21 mg/dL (ref 8–23)
CO2: 27 mmol/L (ref 22–32)
Calcium: 8.6 mg/dL — ABNORMAL LOW (ref 8.9–10.3)
Chloride: 106 mmol/L (ref 98–111)
Creatinine, Ser: 0.85 mg/dL (ref 0.44–1.00)
GFR, Estimated: >60 mL/min (ref >60)
Glucose, Bld: 80 mg/dL (ref 70–99)
Potassium: 3.6 mmol/L (ref 3.5–5.1)
Sodium: 138 mmol/L (ref 135–145)

## 2022-05-16 MED ORDER — AMLODIPINE BESYLATE 10 MG PO TABS
10.0000 mg | ORAL_TABLET | Freq: Every day | ORAL | Status: DC
  Administered 2022-05-17: 10 mg via ORAL
  Filled 2022-05-16: qty 1

## 2022-05-16 MED ORDER — BUTALBITAL-APAP-CAFFEINE 50-325-40 MG PO TABS: 1.0000 | ORAL_TABLET | Freq: Four times a day (QID) | ORAL | Status: DC | PRN

## 2022-05-16 MED ORDER — AMLODIPINE BESYLATE 5 MG PO TABS
5.0000 mg | ORAL_TABLET | Freq: Once | ORAL | Status: AC
  Administered 2022-05-16: 5 mg via ORAL
  Filled 2022-05-16: qty 1

## 2022-05-16 MED ORDER — LABETALOL HCL 5 MG/ML IV SOLN: 5.0000 mg | INTRAVENOUS | Status: DC | PRN

## 2022-05-16 NOTE — Progress Notes (Signed)
Physical Therapy Treatment Patient Details Name: Yvonne Singleton MRN: [id number] DOB: 1935/04/09 Today's Date: 05/16/2022   History of Present Illness Pt is an 87 y/o F admitted on 05/12/22 after presenting to the ED from SNF/rehab for evaluation of AMS. In the ED, urinalysis was concerning for a UTI & pt was started on antibiotics for possible UTI & admitted for further evaluation. PMH: multiple hip dislocations, Parkinson's disease, HTN, HLD, GERD, gout, depression, HOH, breast CA s/p R breast radiation therapy, RA, C. diff colitis, GI bleeds    PT Comments    Pt seen for PT tx with pt's son present for session. Pt with improving mentation on this date. Pt is able to complete STS with supervision but requires min assist for short distance gait with RW. Pt demonstrates significantly impaired gait pattern & inability to follow cuing for upright posture & corrected steps with RLE. Pt engaged in RLE strengthening exercises with PRN cuing for technique as pt with weak hip flexors. Continue to recommend STR upon d/c. Will continue to follow pt acutely to address balance, strength, and gait with LRAD.    Recommendations for follow up therapy are one component of a multi-disciplinary discharge planning process, led by the attending physician.  Recommendations may be updated based on patient status, additional functional criteria and insurance authorization.  Follow Up Recommendations  Skilled nursing-short term rehab (<3 hours/day) Can patient physically be transported by private vehicle: No   Assistance Recommended at Discharge Frequent or constant Supervision/Assistance  Patient can return home with the following A lot of help with walking and/or transfers;A lot of help with bathing/dressing/bathroom;Assist for transportation;Assistance with cooking/housework;Help with stairs or ramp for entrance   Equipment Recommendations  None recommended by PT    Recommendations for Other Services        Precautions / Restrictions Precautions Precautions: Fall Restrictions Weight Bearing Restrictions: No RLE Weight Bearing: Weight bearing as tolerated     Mobility  Bed Mobility               General bed mobility comments: not tested, pt received & left sitting in recliner    Transfers Overall transfer level: Needs assistance Equipment used: Rolling walker (2 wheels) Transfers: Sit to/from Stand Sit to Stand: Supervision           General transfer comment: able to transfer STS from recliner with supervision, poor awareness of reaching back for recliner armrests during stand>sit with PT facilitating movement to ensure pt is fully turned to recliner seat for stand>sit    Ambulation/Gait Ambulation/Gait assistance: Min assist Gait Distance (Feet): 15 Feet Assistive device: Rolling walker (2 wheels) Gait Pattern/deviations: Decreased step length - right, Decreased step length - left, Decreased stride length, Decreased weight shift to right Gait velocity: significantly decreased     General Gait Details: Pt with significant kyphosis/forward trunk flexion, unable to upright trunk & ambulate within base of AD despite cuing/education. Pt initially moderate RLE foot clearance but then begins to drag it with poor foot clearance, heel strike, & dorsiflexion during swing phase. Pt endorses pain in R foot/heel.   Stairs             Wheelchair Mobility    Modified Rankin (Stroke Patients Only)       Balance Overall balance assessment: Needs assistance Sitting-balance support: Feet supported Sitting balance-Leahy Scale: Fair     Standing balance support: During functional activity, Bilateral upper extremity supported, Reliant on assistive device for balance Standing balance-Leahy Scale: Poor  Cognition Arousal/Alertness: Awake/alert Behavior During Therapy: WFL for tasks assessed/performed Overall Cognitive  Status: Impaired/Different from baseline Area of Impairment: Following commands, Memory, Attention, Problem solving                 Orientation Level:  (AxOx4 today)     Following Commands: Follows one step commands consistently, Follows one step commands with increased time   Awareness: Anticipatory, Emergent Problem Solving: Decreased initiation (decreased motor planning, problem solving)          Exercises General Exercises - Lower Extremity Short Arc Quad: AROM, Strengthening, Right, 10 reps Heel Slides: AAROM, Strengthening, Right, 10 reps Straight Leg Raises: AAROM, Strengthening, Right, 10 reps    General Comments        Pertinent Vitals/Pain Pain Assessment Pain Assessment: Faces Faces Pain Scale: Hurts little more Pain Location: inside of R heel/foot Pain Descriptors / Indicators: Sore Pain Intervention(s): Monitored during session    Home Living                          Prior Function            PT Goals (current goals can now be found in the care plan section) Acute Rehab PT Goals Patient Stated Goal: get better PT Goal Formulation: With patient/family Time For Goal Achievement: 05/28/22 Potential to Achieve Goals: Fair Progress towards PT goals: Progressing toward goals    Frequency    Min 2X/week      PT Plan Current plan remains appropriate    Co-evaluation              AM-PAC PT "6 Clicks" Mobility   Outcome Measure  Help needed turning from your back to your side while in a flat bed without using bedrails?: A Little Help needed moving from lying on your back to sitting on the side of a flat bed without using bedrails?: A Lot Help needed moving to and from a bed to a chair (including a wheelchair)?: A Little Help needed standing up from a chair using your arms (e.g., wheelchair or bedside chair)?: A Little Help needed to walk in hospital room?: A Lot Help needed climbing 3-5 steps with a railing? : Total 6 Click  Score: 14    End of Session Equipment Utilized During Treatment: Gait belt Activity Tolerance: Patient tolerated treatment well Patient left: in chair;with chair alarm set;with call bell/phone within reach;with family/visitor present Nurse Communication: Mobility status PT Visit Diagnosis: Unsteadiness on feet (R26.81);Difficulty in walking, not elsewhere classified (R26.2);Muscle weakness (generalized) (M62.81)     Time: 9774-1423 PT Time Calculation (min) (ACUTE ONLY): 13 min  Charges:  $Therapeutic Activity: 8-22 mins                     Lavone Nian, PT, DPT 05/16/22, 12:20 PM   Waunita Schooner 05/16/2022, 12:18 PM

## 2022-05-16 NOTE — TOC Progression Note (Signed)
Transition of Care [facility]) - Progression Note    Patient Details  Name: Yvonne Singleton MRN: [id number] Date of Birth: [birth year]  Transition of Care [facility]) CM/SW Contact  Izola Price, RN Phone Number: 05/16/2022, 2:02 PM  Clinical Narrative: 7/30: Submission to [facility] for readmit remains pending in [place]. Simmie Davies RN CM            Expected Discharge Plan and Services                                                 Social Determinants of Health (SDOH) Interventions    Readmission Risk Interventions     No data to display

## 2022-05-16 NOTE — Progress Notes (Signed)
Progress Note   Patient: Yvonne Singleton CZY:[id number] DOB: 02-24-1935 DOA: 05/12/2022     3 DOS: the patient was seen and examined on 05/16/2022   Brief hospital course: 87 year old female with past medical history of multiple hip dislocations Parkinson disease, hypertension, hyperlipidemia, GERD, gout, depression, hard of hearing, breast cancer status post right breast radiation therapy, rheumatoid arthritis, C. difficile colitis, prior GI bleeds who presented from SNF/rehab for evaluation of altered mental status.  [facility] after admission from 6/28 - 6/26 for which you underwent surgical repair of a recurrent right hip dislocation.  At baseline, patient is normally alert oriented and clear minded.  On admission, she was oriented only to self.  She had no focal neurologic deficits or other symptoms.  Evaluation in the ED: Afebrile, no leukocytosis but urinalysis was hazy in appearance with many bacteria concerning for UTI.  Vitals were notable for respiratory rate 24 mm mildly soft BP of 109/82.  Right hip x-ray was negative for acute fracture or dislocation.  Patient was started on empiric Rocephin for possible UTI and admitted for further evaluation management.  Pain medication regimen was reduced and Neurontin held at time of admission.  Assessment and Plan: * Acute metabolic encephalopathy Suspect multifactorial due to likely UTI and polypharmacy given patient on pain medications and Neurontin.  CT head negative.  No focal neurodeficit on physical examination.  Patient is taking OxyContin 10 mg twice daily and Percocet 5/325 mg every 4 hours as needed, also on Neurontin.  Urinalysis showed possible UTI.  Started on empiric Rocephin on admission. TSH normal.  Vitamin B12 a bit low.  Vitamin D normal.  7/28: not yet at baseline, but improving.  Oriented today, speaking better but still forgets what she's saying and difficulty recalling recent events or  symptoms including hip pain (not typical for her). Son at bedside denies any prior memory issues.  -- Stop OxyContin (by chart review, unclear when this was started or by whom) - discontinue -- Stop Percocet -- Resume Neurontin (200 mg BID, on 300 BID PTA) -- Continue scheduled Tylenol 1000 mg TID -- Continue oxycodone IR 5 mg q6h PRN -- Treat UTI as outlined -- Delirium precautions -- Neurochecks -- Start vitamin B12 supplement  Recurrent dislocation, right hip S/P admission 6/20--26/2023, underwent R total hip replacement 04/08/22 with Dr. Rudene Christians.  Seen at follow up on 04/22/2022. --Pain control as outlined above --PT/OT --Anticipate return to SNF/rehab  UTI (urinary tract infection) Urinalysis with hazy appearance, negative leukocyte, with WBC 0-5, but urinalysis showed many bacteria, indicating possible UTI. Urine culture contaminated, grew multiple species. Repeat culture no growth indicating adequate antibiotic coverage. -- On Rocephin, complete 5-day course  Parkinson disease ([place]) -Sinemet  Hypertension Not on antihypertensives as outpatient, actually on midodrine for hypotension. --Continue midodrine with hold parameters for SBP above 130 or DBP above 90 --Midodrine not given since parameters added and BP remains quite elevated --Adjusted pain regimen as outlined -- Started 5 mg amlodipine, BP still elevated, increase to 10 mg -- Stop as needed hydralazine in case it is causing her headaches -- As needed IV labetalol  Gout - Continue allopurinol  Depression - Zoloft  Headache Possibly related to elevated blood pressures.  She had also received hydralazine a couple of times and headache is a common side effect. -- Stop as needed hydralazine -- Tylenol as needed -- Fioricet if headache not improved with Tylenol -- Optimize BP control  Vitamin B12 deficiency B12 level  checked for evaluation of AMS, slightly low at 177. --start oral B12 supplement         Subjective: Patient awake sitting up in recliner visiting with son at bedside when seen on rounds this morning.  She reports having a headache overnight with difficulty sleeping, continues to have a headache this morning that is frontal on the left side, reports occasionally having that eye watery as well.  Typically does not have headaches.  Discussed her blood pressure running elevated which she has been on midodrine for low blood pressures for some time we have been holding that.  She says its been a long time since she needed medicines to lower her blood pressure.  Denies any significant pain and says pains been well controlled.  No other acute complaints.  Physical Exam: Vitals:   05/16/22 0655 05/16/22 0830 05/16/22 1241 05/16/22 1619  BP: 130/80 (!) 155/76 (!) 147/83 (!) 145/68  Pulse:  72 83 73  Resp:  18  16  Temp:  98.3 F (36.8 C)  98.1 F (36.7 C)  TempSrc:      SpO2:  97%  98%  Weight:      Height:       General exam: Awake and alert, sitting in recliner, no acute distress, conversational HEENT: wearing glasses, moist mucus membranes, hearing grossly normal  Respiratory system: Normal respiratory effort at rest, on room air Cardiovascular system: Brisk cap refill, no peripheral edema.   Central nervous system: Alert and oriented x3, no gross focal deficits, normal speech Extremities: no edema, no cyanosis, normal tone Skin: dry, intact, normal temperature, no rashes seen Psychiatry: normal mood, congruent affect, judgment and insight appear normal   Data Reviewed: BMP normal except for calcium 8.6, vitamin B12 level slightly low at 177.  Vitamin D level normal.  Repeat urine culture no growth  Family Communication: son at bedside on rounds  Disposition: Status is: Inpatient Remains inpatient appropriate because: requires SNF, plan return [facility] when they can re-accept.  Expect d/c tomorrow.  Medically stable.   Planned Discharge Destination: Skilled nursing  facility    Time spent: 35 minutes  Author: Ezekiel Slocumb, DO 05/16/2022 5:00 PM  For on call review [email or web address].

## 2022-05-16 NOTE — Assessment & Plan Note (Addendum)
Possibly related to elevated blood pressures.  She had also received hydralazine a couple of times and headache is a common side effect. -- Stopped PRN hydralazine -- Tylenol as needed -- Optimize BP control

## 2022-05-17 ENCOUNTER — Encounter: Payer: Self-pay | Admitting: Internal Medicine

## 2022-05-17 DIAGNOSIS — G9341 Metabolic encephalopathy: Secondary | ICD-10-CM | POA: Diagnosis not present

## 2022-05-17 MED ORDER — MIDODRINE HCL 2.5 MG PO TABS: 2.5000 mg | ORAL_TABLET | Freq: Three times a day (TID) | ORAL | Status: DC

## 2022-05-17 MED ORDER — OXYCODONE HCL 5 MG PO TABS: 5.0000 mg | ORAL_TABLET | Freq: Four times a day (QID) | ORAL | 0 refills | Status: DC | PRN

## 2022-05-17 MED ORDER — CYANOCOBALAMIN 1000 MCG PO TABS: 1000.0000 ug | ORAL_TABLET | Freq: Every day | ORAL | Status: DC

## 2022-05-17 MED ORDER — ACETAMINOPHEN 500 MG PO TABS: 1000.0000 mg | ORAL_TABLET | Freq: Three times a day (TID) | ORAL | 0 refills | Status: DC

## 2022-05-17 MED ORDER — AMLODIPINE BESYLATE 10 MG PO TABS: 10.0000 mg | ORAL_TABLET | Freq: Every day | ORAL | Status: DC

## 2022-05-17 NOTE — Discharge Summary (Signed)
Physician Discharge Summary   Patient: Yvonne Singleton MRN: [id number] DOB: April 18, 1935  Admit date:     05/12/2022  Discharge date: 05/17/2022  Discharge Physician: Ezekiel Slocumb   PCP: Perrin Maltese, MD   Recommendations at discharge:    Follow up with Primary Care in 1-2 weeks Repeat CBC, BMP, Mg in 1-2 weeks Follow up on blood pressure  - pt was started on amlodipine during admission due to elevated BP.   - hold parameters placed on midodrine.   Follow up with orthopedic surgery as scheduled  Discharge Diagnoses: Active Problems:   Recurrent dislocation, right hip   Parkinson disease (H[facility])   Hypertension   Gout   Depression   Vitamin B12 deficiency  Principal Problem (Resolved):   Acute metabolic encephalopathy Resolved Problems:   UTI (urinary tract infection)   Headache  Hospital Course: 87 year old female with past medical history of multiple hip dislocations Parkinson disease, hypertension, hyperlipidemia, GERD, gout, depression, hard of hearing, breast cancer status post right breast radiation therapy, rheumatoid arthritis, C. difficile colitis, prior GI bleeds who presented from SNF/rehab for evaluation of altered mental status.  [facility] after admission from 6/28 - 6/26 for which you underwent surgical repair of a recurrent right hip dislocation.  At baseline, patient is normally alert oriented and clear minded.  On admission, she was oriented only to self.  She had no focal neurologic deficits or other symptoms.  Evaluation in the ED: Afebrile, no leukocytosis but urinalysis was hazy in appearance with many bacteria concerning for UTI.  Vitals were notable for respiratory rate 24 mm mildly soft BP of 109/82.  Right hip x-ray was negative for acute fracture or dislocation.  Patient was started on empiric Rocephin for possible UTI and admitted for further evaluation management.  Pain medication regimen was reduced and Neurontin  held at time of admission.   7/31: Pt doing well this AM.  Medically stable and agreeable to discharge back to [facility] to continue rehab from her hip surgery.     Assessment and Plan: * Acute metabolic encephalopathy-resolved as of 05/17/2022 Suspect multifactorial due to likely UTI and polypharmacy given patient on pain medications and Neurontin.  CT head negative.  No focal neurodeficit on physical examination.  Patient is taking OxyContin 10 mg twice daily and Percocet 5/325 mg every 4 hours as needed, also on Neurontin.  Urinalysis showed possible UTI.  Started on empiric Rocephin on admission. TSH normal.  Vitamin B12 a bit low.  Vitamin D normal.  7/29-30: mentation back to baseline  -- Stopped OxyContin (by chart review, unclear when this was started or by whom) - discontinue -- Stopped Percocet -- Resumed Neurontin -- Continue scheduled Tylenol 1000 mg TID -- Continue oxycodone IR 5 mg q6h PRN -- Treated for UTI as outlined -- Delirium precautions -- Started vitamin B12 supplement  Recurrent dislocation, right hip S/P admission 6/20--26/2023, underwent R total hip replacement 04/08/22 with Dr. Rudene Christians.  Seen at follow up on 04/22/2022. --Pain control as outlined above --PT/OT --Anticipate return to SNF/rehab  UTI (urinary tract infection)-resolved as of 05/17/2022 Urinalysis with hazy appearance, negative leukocyte, with WBC 0-5, but urinalysis showed many bacteria, indicating possible UTI. Urine culture contaminated, grew multiple species. Repeat culture negative. Treated with Rocephin, completed 5-day course  Parkinson disease ([place]) -Sinemet  Hypertension Not on antihypertensives as outpatient, actually on midodrine for hypotension. --Continue midodrine with hold parameters for SBP above 130 or DBP above 90 --Midodrine not given  since parameters added and BP remains quite elevated --Adjusted pain regimen as outlined -- Started 5 mg amlodipine, BP still elevated, increase to  10 mg -- Stop as needed hydralazine in case it is causing her headaches -- As needed IV labetalol  Gout - Continue allopurinol  Depression - Zoloft  Vitamin B12 deficiency B12 level checked for evaluation of AMS, slightly low at 177. --start oral B12 supplement  Headache-resolved as of 05/17/2022 Possibly related to elevated blood pressures.  She had also received hydralazine a couple of times and headache is a common side effect. -- Stopped PRN hydralazine -- Tylenol as needed -- Optimize BP control         Consultants: None Procedures performed: None   Disposition: Skilled nursing facility   Diet recommendation:  Discharge Diet Orders (From admission, onward)     Start     Ordered   05/17/22 0000  Diet - low sodium heart healthy        05/17/22 1057           Cardiac diet DISCHARGE MEDICATION: Allergies as of 05/17/2022       Reactions   Trospium Nausea And Vomiting   Digoxin    Other reaction(s): Unknown Note: pt had lethargy, felt bad Note: pt had lethargy, felt bad Other reaction(s): Unknown Note: pt had lethargy, felt bad Other reaction(s): Unknown Note: pt had lethargy, felt bad Note: pt had lethargy, felt bad   Iodinated Contrast Media Hives   Streptomycin Hives        Medication List     STOP taking these medications    enoxaparin 40 MG/0.4ML injection Commonly known as: LOVENOX   oxyCODONE-acetaminophen 5-325 MG tablet Commonly known as: PERCOCET/ROXICET       TAKE these medications    acetaminophen 500 MG tablet Commonly known as: TYLENOL Take 2 tablets (1,000 mg total) by mouth every 8 (eight) hours. What changed:  medication strength how much to take when to take this reasons to take this   allopurinol 100 MG tablet Commonly known as: ZYLOPRIM Take 200 mg by mouth at bedtime.   amLODipine 10 MG tablet Commonly known as: NORVASC Take 1 tablet (10 mg total) by mouth daily. Start taking on: May 18, 2022    Aspercreme Lidocaine 4 % Generic drug: lidocaine Place 1 patch onto the skin every 12 (twelve) hours.   b complex vitamins tablet Take 1 tablet by mouth daily.   Calcium Carbonate-Vitamin D 600-200 MG-UNIT Tabs Take 1 tablet by mouth 2 (two) times daily.   carbidopa-levodopa 50-200 MG tablet Commonly known as: SINEMET CR Take 1 tablet by mouth 2 (two) times daily.   cholecalciferol 1000 units tablet Commonly known as: VITAMIN D Take 2,000 Units by mouth daily.   clotrimazole 1 % cream Commonly known as: LOTRIMIN Apply topically 2 (two) times daily.   cyanocobalamin 1000 MCG tablet Take 1 tablet (1,000 mcg total) by mouth daily. Start taking on: May 18, 2022   docusate sodium 100 MG capsule Commonly known as: COLACE Take 1 capsule (100 mg total) by mouth 2 (two) times daily.   feeding supplement Liqd Take 237 mLs by mouth 2 (two) times daily between meals.   gabapentin 300 MG capsule Commonly known as: NEURONTIN Take 1 capsule (300 mg total) by mouth 2 (two) times daily.   latanoprost 0.005 % ophthalmic solution Commonly known as: XALATAN Place 1 drop into both eyes every evening.   midodrine 2.5 MG tablet Commonly known as: PROAMATINE Take  1 tablet (2.5 mg total) by mouth 3 (three) times daily with meals. HOLD IF SYSTOLIC BP > 179 What changed: additional instructions   Myrbetriq 25 MG Tb24 tablet Generic drug: mirabegron ER Take 25 mg by mouth daily.   oxyCODONE 5 MG immediate release tablet Commonly known as: Oxy IR/ROXICODONE Take 1 tablet (5 mg total) by mouth every 6 (six) hours as needed for severe pain or moderate pain.   pantoprazole 40 MG tablet Commonly known as: PROTONIX Take 1 tablet (40 mg total) by mouth 2 (two) times daily before a meal.   polyethylene glycol 17 g packet Commonly known as: MIRALAX / GLYCOLAX Take 17 g by mouth 2 (two) times daily.   senna-docusate 8.6-50 MG tablet Commonly known as: Senokot-S Take 1 tablet by mouth at  bedtime as needed for mild constipation.   sertraline 100 MG tablet Commonly known as: ZOLOFT Take 100 mg by mouth daily.   tolterodine 4 MG 24 hr capsule Commonly known as: DETROL LA Take 4 mg by mouth daily.        Follow-up Information     Perrin Maltese, MD. Schedule an appointment as soon as possible for a visit in 1 week(s).   Specialty: Internal Medicine Why: Hospital follow up Contact information: [address] [place] [address] [phone]                Discharge Exam: Filed Weights   05/12/22 0949  Weight: 70.7 kg   General exam: awake, alert, no acute distress HEENT: atraumatic, clear conjunctiva, anicteric sclera, moist mucus membranes, hearing grossly normal  Respiratory system: CTA, no wheezes, rales or rhonchi, normal respiratory effort. Cardiovascular system: normal S1/S2, RRR,  no pedal edema.   Gastrointestinal system: soft, NT, ND, no HSM felt, +bowel sounds. Central nervous system: A&O x3. no gross focal neurologic deficits, normal speech Extremities: moves all, no edema, normal tone Skin: dry, intact, normal temperature, normal color, No rashes, lesions or ulcers Psychiatry: normal mood, congruent affect, judgement and insight appear normal   Condition at discharge: stable  The results of significant diagnostics from this hospitalization (including imaging, microbiology, ancillary and laboratory) are listed below for reference.   Imaging Studies: CT Head Wo Contrast  Result Date: 05/12/2022 CLINICAL DATA:  Mental status change, unknown cause EXAM: CT HEAD WITHOUT CONTRAST TECHNIQUE: Contiguous axial images were obtained from the base of the skull through the vertex without intravenous contrast. RADIATION DOSE REDUCTION: This exam was performed according to the departmental dose-optimization program which includes automated exposure control, adjustment of the mA and/or kV according to patient size and/or use of iterative reconstruction  technique. COMPARISON:  MRI head April 13, 23. FINDINGS: Brain: No evidence of acute infarction, hemorrhage, hydrocephalus, extra-axial collection or mass lesion/mass effect. Similar mild cerebral atrophy. Vascular: No hyperdense vessel identified. Skull: No acute fracture. Sinuses/Orbits: Clear sinuses.  No acute orbital findings. Other: No mastoid effusions. IMPRESSION: No evidence of acute intracranial abnormality. Electronically Signed   By: Margaretha Sheffield M.D.   On: 05/12/2022 11:20   DG Hip Unilat W or Wo Pelvis 2-3 Views Right  Result Date: 05/12/2022 CLINICAL DATA:  Right hip pain right hip surgery in June. EXAM: DG HIP (WITH OR WITHOUT PELVIS) 2-3V RIGHT COMPARISON:  Right hip radiograph April 08, 2022 FINDINGS: Prior bilateral total hip arthroplasties. Right hip arthroplasty is unremarkable without evidence of loosening or perihardware fracture. Similar appearance of the left hip arthroplasty dating back to April 06, 2022. Lumbar spondylosis. IMPRESSION: Right total  hip arthroplasty without evidence of loosening or hardware fracture. Electronically Signed   By: Dahlia Bailiff M.D.   On: 05/12/2022 11:18    Microbiology: Results for orders placed or performed during the hospital encounter of 05/12/22  Urine Culture     Status: Abnormal   Collection Time: 05/12/22  1:01 PM   Specimen: Urine, Clean Catch  Result Value Ref Range Status   Specimen Description   Final    URINE, CLEAN CATCH Performed at [facility], [address] [place], [place] [address]    Special Requests   Final    NONE Performed at [facility], [address] [place], [place] [address]    Culture MULTIPLE SPECIES PRESENT, SUGGEST RECOLLECTION (A)  Final   Report Status 05/13/2022 FINAL  Final  Urine Culture     Status: None   Collection Time: 05/14/22  8:49 PM   Specimen: Urine, Random  Result Value Ref Range Status   Specimen Description   Final    URINE, RANDOM Performed at [facility], [address] [place], [place] [address]    Special Requests   Final    NONE Performed at [facility], [address], [place], [place] [address]    Culture   Final    NO GROWTH Performed at [facility], [place] [address] [place],  [address]    Report Status 05/16/2022 FINAL  Final    Labs: CBC: Recent Labs  Lab 05/12/22 0954  WBC 8.3  HGB 11.3*  HCT 35.1*  MCV 100.9*  PLT 881   Basic Metabolic Panel: Recent Labs  Lab 05/12/22 0954 05/14/22 0510 05/16/22 0612  NA 140 141 138  K 3.9 3.6 3.6  CL 105 109 106  CO2 26 25 27   GLUCOSE 93 84 80  BUN 15 12 21   CREATININE 0.79 0.75 0.85  CALCIUM 8.9 8.6* 8.6*  MG  --  1.9  --   PHOS  --  3.9  --    Liver Function Tests: Recent Labs  Lab 05/12/22 0954  AST 15  ALT <5  ALKPHOS 126  BILITOT 0.8  PROT 6.5  ALBUMIN 3.5   CBG: No results for input(s): "GLUCAP" in the last 168 hours.  Discharge time spent: less than 30 minutes.  Signed: Ezekiel Slocumb, DO [facility] 05/17/2022

## 2022-05-17 NOTE — Progress Notes (Signed)
Patient is being discharged to [facility] to room 601-B. Called and gave report to Legrand Como. Patient will be transported via EMS.

## 2022-05-17 NOTE — Care Management Important Message (Signed)
Important Message  Patient Details  Name: Rebbie Lauricella MRN: [id number] Date of Birth: 01/12/1935   Medicare Important Message Given:  Yes     Juliann Pulse A Avarey Yaeger 05/17/2022, 11:36 AM

## 2022-05-17 NOTE — TOC Transition Note (Signed)
Transition of Care [facility]) - CM/SW Discharge Note   Patient Details  Name: Aurie Harroun MRN: [id number] Date of Birth: [birth year]  [facility]) CM/SW Contact:  Alberteen Sam, LCSW Phone Number: 05/17/2022, 11:18 AM   Clinical Narrative:     [facility] will DC to: [facility] Anticipated DC date: 05/17/22 Transport by: [facility]  Per MD patient ready for DC to [facility]. RN, patient, patient's family, and facility notified of DC. Discharge Summary sent to facility. RN given number for report  [phone] Room 601B. DC packet on chart. Ambulance transport requested for patient.  CSW signing off.  Pricilla Riffle, LCSW    Final next level of care: Skilled Nursing Facility Barriers to Discharge: No Barriers Identified   Patient Goals and CMS Choice Patient states their goals for this hospitalization and ongoing recovery are:: to go home CMS Medicare.gov Compare Post Acute Care list provided to:: Patient Choice offered to / list presented to : Patient  Discharge Placement              Patient chooses bed at: [facility] Patient to be transferred to facility by: [facility]   Patient and family notified of of transfer: 05/17/22  Discharge Plan and Services                                     Social Determinants of Health (SDOH) Interventions     Readmission Risk Interventions     No data to display

## 2022-06-29 ENCOUNTER — Observation Stay
Admission: EM | Admit: 2022-06-29 | Discharge: 2022-07-01 | Disposition: A | Discharge status: Home / Self Care | Payer: Medicare Other | Attending: Internal Medicine | Admitting: Internal Medicine

## 2022-06-29 ENCOUNTER — Emergency Department: Payer: Medicare Other

## 2022-06-29 DIAGNOSIS — G20 Parkinson's disease: Secondary | ICD-10-CM | POA: Diagnosis not present

## 2022-06-29 DIAGNOSIS — F32A Depression, unspecified: Secondary | ICD-10-CM | POA: Diagnosis present

## 2022-06-29 DIAGNOSIS — R4182 Altered mental status, unspecified: Secondary | ICD-10-CM | POA: Diagnosis present

## 2022-06-29 DIAGNOSIS — Z23 Encounter for immunization: Secondary | ICD-10-CM | POA: Insufficient documentation

## 2022-06-29 DIAGNOSIS — J189 Pneumonia, unspecified organism: Secondary | ICD-10-CM | POA: Diagnosis not present

## 2022-06-29 DIAGNOSIS — Z79899 Other long term (current) drug therapy: Secondary | ICD-10-CM | POA: Insufficient documentation

## 2022-06-29 DIAGNOSIS — Z96643 Presence of artificial hip joint, bilateral: Secondary | ICD-10-CM | POA: Insufficient documentation

## 2022-06-29 DIAGNOSIS — Z9889 Other specified postprocedural states: Secondary | ICD-10-CM | POA: Insufficient documentation

## 2022-06-29 DIAGNOSIS — G9341 Metabolic encephalopathy: Secondary | ICD-10-CM | POA: Diagnosis not present

## 2022-06-29 DIAGNOSIS — Z853 Personal history of malignant neoplasm of breast: Secondary | ICD-10-CM | POA: Insufficient documentation

## 2022-06-29 DIAGNOSIS — Z20822 Contact with and (suspected) exposure to covid-19: Secondary | ICD-10-CM | POA: Diagnosis not present

## 2022-06-29 DIAGNOSIS — Z96653 Presence of artificial knee joint, bilateral: Secondary | ICD-10-CM | POA: Insufficient documentation

## 2022-06-29 DIAGNOSIS — G20A1 Parkinson's disease without dyskinesia, without mention of fluctuations: Secondary | ICD-10-CM | POA: Diagnosis present

## 2022-06-29 DIAGNOSIS — M109 Gout, unspecified: Secondary | ICD-10-CM | POA: Diagnosis present

## 2022-06-29 DIAGNOSIS — I10 Essential (primary) hypertension: Secondary | ICD-10-CM | POA: Diagnosis not present

## 2022-06-29 LAB — CBC WITH DIFFERENTIAL/PLATELET
Abs Immature Granulocytes: 0.04 10*3/uL (ref 0.00–0.07)
Basophils Absolute: 0.1 10*3/uL (ref 0.0–0.1)
Basophils Relative: 1 %
Eosinophils Absolute: 0.3 10*3/uL (ref 0.0–0.5)
Eosinophils Relative: 3 %
HCT: 39.3 % (ref 36.0–46.0)
Hemoglobin: 12.8 g/dL (ref 12.0–15.0)
Immature Granulocytes: 1 %
Lymphocytes Relative: 25 %
Lymphs Abs: 2.2 10*3/uL (ref 0.7–4.0)
MCH: 31.3 pg (ref 26.0–34.0)
MCHC: 32.6 g/dL (ref 30.0–36.0)
MCV: 96.1 fL (ref 80.0–100.0)
Monocytes Absolute: 0.5 10*3/uL (ref 0.1–1.0)
Monocytes Relative: 6 %
Neutro Abs: 5.6 10*3/uL (ref 1.7–7.7)
Neutrophils Relative %: 64 %
Platelets: 243 10*3/uL (ref 150–400)
RBC: 4.09 MIL/uL (ref 3.87–5.11)
RDW: 14.9 % (ref 11.5–15.5)
WBC: 8.6 10*3/uL (ref 4.0–10.5)
nRBC: 0 % (ref 0.0–0.2)

## 2022-06-29 LAB — URINALYSIS, COMPLETE (UACMP) WITH MICROSCOPIC
Bilirubin Urine: NEGATIVE
Glucose, UA: NEGATIVE mg/dL
Ketones, ur: NEGATIVE mg/dL
Leukocytes,Ua: NEGATIVE
Nitrite: NEGATIVE
Protein, ur: 100 mg/dL — AB
Specific Gravity, Urine: 1.005 (ref 1.005–1.030)
Squamous Epithelial / HPF: NONE SEEN (ref 0–5)
pH: 5 (ref 5.0–8.0)

## 2022-06-29 LAB — LACTIC ACID, PLASMA
Lactic Acid, Venous: 1 mmol/L (ref 0.5–1.9)
Lactic Acid, Venous: 0.9 mmol/L (ref 0.5–1.9)

## 2022-06-29 LAB — COMPREHENSIVE METABOLIC PANEL
ALT: 13 U/L (ref 0–44)
AST: 23 U/L (ref 15–41)
Albumin: 3.8 g/dL (ref 3.5–5.0)
Alkaline Phosphatase: 107 U/L (ref 38–126)
Anion gap: 7 (ref 5–15)
BUN: 25 mg/dL — ABNORMAL HIGH (ref 8–23)
CO2: 24 mmol/L (ref 22–32)
Calcium: 9.5 mg/dL (ref 8.9–10.3)
Chloride: 107 mmol/L (ref 98–111)
Creatinine, Ser: 0.84 mg/dL (ref 0.44–1.00)
GFR, Estimated: >60 mL/min (ref >60)
Glucose, Bld: 94 mg/dL (ref 70–99)
Potassium: 4.1 mmol/L (ref 3.5–5.1)
Sodium: 138 mmol/L (ref 135–145)
Total Bilirubin: 0.5 mg/dL (ref 0.3–1.2)
Total Protein: 7 g/dL (ref 6.5–8.1)

## 2022-06-29 LAB — PROCALCITONIN: Procalcitonin: <0.1 ng/mL

## 2022-06-29 LAB — RESP PANEL BY RT-PCR (FLU A&B, COVID) ARPGX2
Influenza A by PCR: NEGATIVE
Influenza B by PCR: NEGATIVE
SARS Coronavirus 2 by RT PCR: NEGATIVE

## 2022-06-29 MED ORDER — ALLOPURINOL 100 MG PO TABS
200.0000 mg | ORAL_TABLET | Freq: Every day | ORAL | Status: DC
  Administered 2022-06-29 – 2022-06-30 (×2): 200 mg via ORAL
  Filled 2022-06-29 (×2): qty 2

## 2022-06-29 MED ORDER — ACETAMINOPHEN 650 MG RE SUPP: 650.0000 mg | Freq: Four times a day (QID) | RECTAL | Status: DC | PRN

## 2022-06-29 MED ORDER — SENNOSIDES-DOCUSATE SODIUM 8.6-50 MG PO TABS: 1.0000 | ORAL_TABLET | Freq: Every evening | ORAL | Status: DC | PRN

## 2022-06-29 MED ORDER — LATANOPROST 0.005 % OP SOLN
1.0000 [drp] | Freq: Every evening | OPHTHALMIC | Status: DC
  Administered 2022-06-29: 1 [drp] via OPHTHALMIC
  Filled 2022-06-29 (×2): qty 2.5

## 2022-06-29 MED ORDER — RENA-VITE PO TABS
1.0000 | ORAL_TABLET | Freq: Every day | ORAL | Status: DC
  Administered 2022-06-29 – 2022-07-01 (×3): 1 via ORAL
  Filled 2022-06-29 (×3): qty 1

## 2022-06-29 MED ORDER — INFLUENZA VAC A&B SA ADJ QUAD 0.5 ML IM PRSY
0.5000 mL | PREFILLED_SYRINGE | INTRAMUSCULAR | Status: AC
  Administered 2022-06-30: 0.5 mL via INTRAMUSCULAR
  Filled 2022-06-29: qty 0.5

## 2022-06-29 MED ORDER — LORATADINE 10 MG PO TABS
10.0000 mg | ORAL_TABLET | Freq: Every day | ORAL | Status: DC
  Administered 2022-06-30 – 2022-07-01 (×3): 10 mg via ORAL
  Filled 2022-06-29 (×3): qty 1

## 2022-06-29 MED ORDER — DOCUSATE SODIUM 100 MG PO CAPS
100.0000 mg | ORAL_CAPSULE | Freq: Two times a day (BID) | ORAL | Status: DC
  Administered 2022-07-01: 100 mg via ORAL
  Filled 2022-06-29 (×4): qty 1

## 2022-06-29 MED ORDER — MIDODRINE HCL 5 MG PO TABS
2.5000 mg | ORAL_TABLET | Freq: Three times a day (TID) | ORAL | Status: DC
  Administered 2022-06-30 – 2022-07-01 (×5): 2.5 mg via ORAL
  Filled 2022-06-29 (×5): qty 1

## 2022-06-29 MED ORDER — ONDANSETRON HCL 4 MG PO TABS: 4.0000 mg | ORAL_TABLET | Freq: Four times a day (QID) | ORAL | Status: DC | PRN

## 2022-06-29 MED ORDER — VITAMIN D 25 MCG (1000 UNIT) PO TABS
2000.0000 [IU] | ORAL_TABLET | Freq: Every day | ORAL | Status: DC
  Administered 2022-06-30 – 2022-07-01 (×3): 2000 [IU] via ORAL
  Filled 2022-06-29 (×3): qty 2

## 2022-06-29 MED ORDER — SODIUM CHLORIDE 0.9 % IV SOLN
1.0000 g | Freq: Once | INTRAVENOUS | Status: AC
  Administered 2022-06-29: 1 g via INTRAVENOUS
  Filled 2022-06-29: qty 10

## 2022-06-29 MED ORDER — SODIUM CHLORIDE 0.9 % IV SOLN: INTRAVENOUS | Status: DC

## 2022-06-29 MED ORDER — MAGNESIUM HYDROXIDE 400 MG/5ML PO SUSP: 30.0000 mL | Freq: Every day | ORAL | Status: DC | PRN

## 2022-06-29 MED ORDER — ENOXAPARIN SODIUM 40 MG/0.4ML IJ SOSY
40.0000 mg | PREFILLED_SYRINGE | INTRAMUSCULAR | Status: DC
  Administered 2022-06-30 (×2): 40 mg via SUBCUTANEOUS
  Filled 2022-06-29 (×2): qty 0.4

## 2022-06-29 MED ORDER — OXYCODONE HCL 5 MG PO TABS
5.0000 mg | ORAL_TABLET | Freq: Four times a day (QID) | ORAL | Status: DC | PRN
  Administered 2022-06-30 (×2): 5 mg via ORAL
  Filled 2022-06-29 (×2): qty 1

## 2022-06-29 MED ORDER — LISINOPRIL 5 MG PO TABS
2.5000 mg | ORAL_TABLET | Freq: Every day | ORAL | Status: DC
  Administered 2022-06-30 – 2022-07-01 (×3): 2.5 mg via ORAL
  Filled 2022-06-29 (×3): qty 1

## 2022-06-29 MED ORDER — ONDANSETRON HCL 4 MG/2ML IJ SOLN: 4.0000 mg | Freq: Four times a day (QID) | INTRAMUSCULAR | Status: DC | PRN

## 2022-06-29 MED ORDER — DOXYCYCLINE HYCLATE 100 MG PO TABS
100.0000 mg | ORAL_TABLET | Freq: Once | ORAL | Status: AC
  Administered 2022-06-29: 100 mg via ORAL
  Filled 2022-06-29: qty 1

## 2022-06-29 MED ORDER — OXYCODONE HCL 5 MG PO TABS
5.0000 mg | ORAL_TABLET | Freq: Once | ORAL | Status: AC | PRN
  Administered 2022-06-29: 5 mg via ORAL
  Filled 2022-06-29: qty 1

## 2022-06-29 MED ORDER — CARBIDOPA-LEVODOPA ER 50-200 MG PO TBCR
1.0000 | EXTENDED_RELEASE_TABLET | Freq: Two times a day (BID) | ORAL | Status: DC
  Administered 2022-06-29 – 2022-07-01 (×4): 1 via ORAL
  Filled 2022-06-29 (×4): qty 1

## 2022-06-29 MED ORDER — PANTOPRAZOLE SODIUM 40 MG PO TBEC
40.0000 mg | DELAYED_RELEASE_TABLET | Freq: Every day | ORAL | Status: DC
  Administered 2022-06-30 – 2022-07-01 (×3): 40 mg via ORAL
  Filled 2022-06-29 (×3): qty 1

## 2022-06-29 MED ORDER — MIRABEGRON ER 25 MG PO TB24
25.0000 mg | ORAL_TABLET | Freq: Every day | ORAL | Status: DC
  Administered 2022-06-29 – 2022-07-01 (×3): 25 mg via ORAL
  Filled 2022-06-29 (×3): qty 1

## 2022-06-29 MED ORDER — VITAMIN B-12 1000 MCG PO TABS
1000.0000 ug | ORAL_TABLET | Freq: Every day | ORAL | Status: DC
  Administered 2022-06-30 – 2022-07-01 (×2): 1000 ug via ORAL
  Filled 2022-06-29 (×2): qty 1

## 2022-06-29 MED ORDER — TRAZODONE HCL 50 MG PO TABS: 25.0000 mg | ORAL_TABLET | Freq: Every evening | ORAL | Status: DC | PRN

## 2022-06-29 MED ORDER — GABAPENTIN 300 MG PO CAPS
300.0000 mg | ORAL_CAPSULE | Freq: Every day | ORAL | Status: DC
  Administered 2022-06-30 – 2022-07-01 (×3): 300 mg via ORAL
  Filled 2022-06-29 (×3): qty 1

## 2022-06-29 MED ORDER — OYSTER SHELL CALCIUM/D3 500-5 MG-MCG PO TABS
1.0000 | ORAL_TABLET | Freq: Two times a day (BID) | ORAL | Status: DC
  Administered 2022-06-30 – 2022-07-01 (×4): 1 via ORAL
  Filled 2022-06-29 (×4): qty 1

## 2022-06-29 MED ORDER — ACETAMINOPHEN 325 MG PO TABS
650.0000 mg | ORAL_TABLET | Freq: Four times a day (QID) | ORAL | Status: DC | PRN
  Administered 2022-06-30: 650 mg via ORAL
  Filled 2022-06-29: qty 2

## 2022-06-29 MED ORDER — AMLODIPINE BESYLATE 10 MG PO TABS
10.0000 mg | ORAL_TABLET | Freq: Every day | ORAL | Status: DC
  Administered 2022-06-30 – 2022-07-01 (×3): 10 mg via ORAL
  Filled 2022-06-29 (×3): qty 1

## 2022-06-29 MED ORDER — SERTRALINE HCL 50 MG PO TABS
100.0000 mg | ORAL_TABLET | Freq: Every day | ORAL | Status: DC
  Administered 2022-06-30 – 2022-07-01 (×3): 100 mg via ORAL
  Filled 2022-06-29 (×3): qty 2

## 2022-06-29 NOTE — ED Notes (Signed)
Pt presenting to ED A&O x1, complaining of a headache (unable to assess further), pt has been altered since this morning.

## 2022-06-29 NOTE — H&P (Signed)
[place]   PATIENT NAME: Yvonne Singleton    MR#:  [id number]  DATE OF BIRTH:  [birth year]  DATE OF ADMISSION:  06/29/2022  PRIMARY CARE PHYSICIAN: Perrin Maltese, MD   Patient is coming from: Home  REQUESTING/REFERRING PHYSICIAN:  Lucillie Garfinkel, MD  CHIEF COMPLAINT:   Chief Complaint  Patient presents with   Altered Mental Status    HISTORY OF PRESENT ILLNESS:  Caylah Plouff is a 87 y.o. Caucasian female with medical history significant for Hypertension, Parkinson's disease, right breast cancer status post lumpectomy, who presented to the emergency room with acute onset of altered mental status for the last couple of days with associated confusion.  The patient is usually well-functioning at her age.  She denies any cough or wheezing or dyspnea.  No fever or chills.  No dysuria, oliguria or hematuria or flank pain at home.  When she had catheter placed in the ER she had mild dysuria.  No chest pain or palpitations.  She had diarrhea yesterday but denied it today.  No nausea or vomiting.  No headache or dizziness or blurred vision.  No paresthesias or focal muscle weakness.  ED Course: When she came to the ER, BP was 147/61 with otherwise normal vital signs.  Labs revealed a UA with 100 protein and rare bacteria with 0-5 WBCs.  CMP showed a BUN of 25 and was otherwise normal.  CBC was within normal. EKG as reviewed by me : EKG showed likely atrial flutter with controlled ventricular sponsor 61, right axis deviation and low voltage QRS. Imaging: Chest x-ray showed right medial basilar airspace opacity that could reflect atelectasis or infiltrate/pneumonia.  The patient was given 100 of p.o. doxycycline and 5 mg of p.o. oxycodone.  She will be admitted to a medical telemetry observation bed for further evaluation and management. PAST MEDICAL HISTORY:   Past Medical History:  Diagnosis Date   Acute metabolic encephalopathy 01/13/9241   Back pain    Breast cancer [facility])  2007   right breast lumpectomy with rad tx   Cancer [facility]) 2007   right breast   Collagen vascular disease ([place])    Gout    Hard of hearing    Headache 05/16/2022   Hypertension    Parkinson's disease ([place])    Personal history of radiation therapy 2007   F/U right breast cancer   UTI (urinary tract infection) 05/12/2022    PAST SURGICAL HISTORY:   Past Surgical History:  Procedure Laterality Date   ABDOMINAL HYSTERECTOMY     ANTERIOR HIP REVISION Right 04/08/2022   Procedure: Anterior hip revision cup and femoral head;  Surgeon: Hessie Knows, MD;  Location: [facility] ORS;  Service: Orthopedics;  Laterality: Right;   BREAST LUMPECTOMY Right 2007   suspicious calcs, f/u with radiation   CATARACT EXTRACTION Bilateral    ESOPHAGOGASTRODUODENOSCOPY (EGD) WITH PROPOFOL N/A 05/25/2017   Procedure: ESOPHAGOGASTRODUODENOSCOPY (EGD) WITH PROPOFOL;  Surgeon: Lollie Sails, MD;  Location: [facility] ENDOSCOPY;  Service: Endoscopy;  Laterality: N/A;   ESOPHAGOGASTRODUODENOSCOPY (EGD) WITH PROPOFOL N/A 05/09/2018   Procedure: ESOPHAGOGASTRODUODENOSCOPY (EGD) WITH PROPOFOL;  Surgeon: Lin Landsman, MD;  Location: [facility] ENDOSCOPY;  Service: Gastroenterology;  Laterality: N/A;   ESOPHAGOGASTRODUODENOSCOPY (EGD) WITH PROPOFOL N/A 12/09/2021   Procedure: ESOPHAGOGASTRODUODENOSCOPY (EGD) WITH PROPOFOL;  Surgeon: Lin Landsman, MD;  Location: [place];  Service: Gastroenterology;  Laterality: N/A;   EYE SURGERY Bilateral    HAND SURGERY     HIP CLOSED REDUCTION Left  06/24/2015   Procedure: CLOSED REDUCTION HIP;  Surgeon: Dereck Leep, MD;  Location: [facility] ORS;  Service: Orthopedics;  Laterality: Left;   HIP CLOSED REDUCTION Left 02/09/2016   Procedure: CLOSED MANIPULATION HIP;  Surgeon: Earnestine Leys, MD;  Location: [facility] ORS;  Service: Orthopedics;  Laterality: Left;   HIP CLOSED REDUCTION Left 10/31/2021   Procedure: CLOSED REDUCTION HIP;  Surgeon: Corky Mull, MD;  Location: [facility] ORS;  Service:  Orthopedics;  Laterality: Left;   HIP SURGERY     JOINT REPLACEMENT     bilateral hip   OOPHORECTOMY     TOTAL KNEE ARTHROPLASTY Bilateral     SOCIAL HISTORY:   Social History   Tobacco Use   Smoking status: Never   Smokeless tobacco: Never  Substance Use Topics   Alcohol use: No    Alcohol/week: 0.0 standard drinks of alcohol    FAMILY HISTORY:   Family History  Problem Relation Age of Onset   Heart disease Mother    Arthritis Mother    Heart disease Father    Ulcers Father    Breast cancer Maternal Aunt     DRUG ALLERGIES:   Allergies  Allergen Reactions   Trospium Nausea And Vomiting   Digoxin     Other reaction(s): Unknown Note: pt had lethargy, felt bad Note: pt had lethargy, felt bad  Other reaction(s): Unknown Note: pt had lethargy, felt bad  Other reaction(s): Unknown Note: pt had lethargy, felt bad Note: pt had lethargy, felt bad   Iodinated Contrast Media Hives   Streptomycin Hives    REVIEW OF SYSTEMS:   ROS As per history of present illness. All pertinent systems were reviewed above. Constitutional, HEENT, cardiovascular, respiratory, GI, GU, musculoskeletal, neuro, psychiatric, endocrine, integumentary and hematologic systems were reviewed and are otherwise negative/unremarkable except for positive findings mentioned above in the HPI.   MEDICATIONS AT HOME:   Prior to Admission medications   Medication Sig Start Date End Date Taking? Authorizing Provider  allopurinol (ZYLOPRIM) 100 MG tablet Take 200 mg by mouth at bedtime.    Yes [provider]  amLODipine (NORVASC) 10 MG tablet Take 1 tablet (10 mg total) by mouth daily. 05/18/22  Yes Nicole Kindred A, DO  b complex vitamins tablet Take 1 tablet by mouth daily.   Yes [provider]  Calcium Carbonate-Vitamin D 600-200 MG-UNIT TABS Take 1 tablet by mouth 2 (two) times daily.   Yes [provider]  carbidopa-levodopa (SINEMET CR) 50-200 MG per tablet Take 1 tablet  by mouth 2 (two) times daily.   Yes [provider]  cetirizine (ZYRTEC) 5 MG tablet 5 mg DAILY (route: oral) 05/26/22  Yes [provider]  cholecalciferol (VITAMIN D) 1000 units tablet Take 2,000 Units by mouth daily.   Yes [provider]  cyanocobalamin 1000 MCG tablet Take 1 tablet (1,000 mcg total) by mouth daily. 05/18/22  Yes Nicole Kindred A, DO  docusate sodium (COLACE) 100 MG capsule Take 1 capsule (100 mg total) by mouth 2 (two) times daily. 04/09/22  Yes Duanne Guess, PA-C  gabapentin (NEURONTIN) 300 MG capsule Take 1 capsule (300 mg total) by mouth 2 (two) times daily. Patient taking differently: Take 300 mg by mouth daily. 04/12/22  Yes Nolberto Hanlon, MD  latanoprost (XALATAN) 0.005 % ophthalmic solution Place 1 drop into both eyes every evening. 02/19/22  Yes [provider]  lisinopril (ZESTRIL) 2.5 MG tablet Take 2.5 mg by mouth daily. 06/28/22  Yes [provider]  MYRBETRIQ 25 MG TB24 tablet Take 25 mg by mouth daily. 10/08/21  Yes [provider]  pantoprazole (PROTONIX) 40 MG tablet Take 1 tablet (40 mg total) by mouth 2 (two) times daily before a meal. Patient taking differently: Take 40 mg by mouth daily. 12/09/21 06/29/22 Yes Vanga, Tally Due, MD  sertraline (ZOLOFT) 100 MG tablet Take 100 mg by mouth daily. 09/30/21  Yes [provider]  tolterodine (DETROL LA) 2 MG 24 hr capsule Take 2 mg by mouth daily.   Yes [provider]  acetaminophen (TYLENOL) 500 MG tablet Take 2 tablets (1,000 mg total) by mouth every 8 (eight) hours. 05/17/22   Ezekiel Slocumb, DO  clotrimazole (LOTRIMIN) 1 % cream Apply topically 2 (two) times daily. Patient not taking: Reported on 06/29/2022 04/12/22   Nolberto Hanlon, MD  feeding supplement (ENSURE ENLIVE / ENSURE PLUS) LIQD Take 237 mLs by mouth 2 (two) times daily between meals. Patient not taking: Reported on 06/29/2022 04/12/22   Nolberto Hanlon, MD  lidocaine (ASPERCREME  LIDOCAINE) 4 % Place 1 patch onto the skin every 12 (twelve) hours. Patient not taking: Reported on 06/29/2022    [provider]  midodrine (PROAMATINE) 2.5 MG tablet Take 1 tablet (2.5 mg total) by mouth 3 (three) times daily with meals. HOLD IF SYSTOLIC BP > 767 Patient not taking: Reported on 06/29/2022 05/17/22   Nicole Kindred A, DO  oxyCODONE (OXY IR/ROXICODONE) 5 MG immediate release tablet Take 1 tablet (5 mg total) by mouth every 6 (six) hours as needed for severe pain or moderate pain. 05/17/22   Ezekiel Slocumb, DO  polyethylene glycol (MIRALAX / GLYCOLAX) 17 g packet Take 17 g by mouth 2 (two) times daily. Patient not taking: Reported on 06/29/2022    [provider]  senna-docusate (SENOKOT-S) 8.6-50 MG tablet Take 1 tablet by mouth at bedtime as needed for mild constipation. 04/12/22   Nolberto Hanlon, MD      VITAL SIGNS:  Blood pressure (!) 140/67, pulse (!) 56, temperature 97.8 F (36.6 C), resp. rate 15, height 5' 3"  (1.6 m), weight 75.8 kg, SpO2 99 %.  PHYSICAL EXAMINATION:  Physical Exam  GENERAL:  87 y.o.-year-old Caucasian female patient lying in the bed with no acute distress.  EYES: Pupils equal, round, reactive to light and accommodation. No scleral icterus. Extraocular muscles intact.  HEENT: Head atraumatic, normocephalic. Oropharynx and nasopharynx clear.  NECK:  Supple, no jugular venous distention. No thyroid enlargement, no tenderness.  LUNGS: Normal breath sounds bilaterally, no wheezing, rales,rhonchi or crepitation. No use of accessory muscles of respiration.  CARDIOVASCULAR: Regular rate and rhythm, S1, S2 normal. No murmurs, rubs, or gallops.  ABDOMEN: Soft, nondistended, nontender. Bowel sounds present. No organomegaly or mass.  EXTREMITIES: No pedal edema, cyanosis, or clubbing.  NEUROLOGIC: Cranial nerves II through XII are intact. Muscle strength 5/5 in all extremities. Sensation intact. Gait not checked.  PSYCHIATRIC: The patient is  alert and oriented to her name.  She was slow to answer questions but was able to tell me the year when given options and the day and her birthday.  Normal affect and good eye contact. SKIN: No obvious rash, lesion, or ulcer.   LABORATORY PANEL:   CBC Recent Labs  Lab 06/29/22 2000  WBC 8.6  HGB 12.8  HCT 39.3  PLT 243   ------------------------------------------------------------------------------------------------------------------  Chemistries  Recent Labs  Lab 06/29/22 2000  NA 138  K 4.1  CL 107  CO2 24  GLUCOSE 94  BUN 25*  CREATININE 0.84  CALCIUM 9.5  AST 23  ALT 13  ALKPHOS 107  BILITOT 0.5   ------------------------------------------------------------------------------------------------------------------  Cardiac Enzymes No results for input(s): "TROPONINI" in the last 168 hours. ------------------------------------------------------------------------------------------------------------------  RADIOLOGY:  CT Head Wo Contrast  Result Date: 06/29/2022 CLINICAL DATA:  Mental status change EXAM: CT HEAD WITHOUT CONTRAST TECHNIQUE: Contiguous axial images were obtained from the base of the skull through the vertex without intravenous contrast. RADIATION DOSE REDUCTION: This exam was performed according to the departmental dose-optimization program which includes automated exposure control, adjustment of the mA and/or kV according to patient size and/or use of iterative reconstruction technique. COMPARISON:  CT head 05/12/2022 FINDINGS: Brain: No intracranial hemorrhage, mass effect, or evidence of acute infarct. No hydrocephalus. No extra-axial fluid collection. Generalized cerebral atrophy. Ill-defined hypoattenuation within the cerebral white matter is nonspecific but consistent with chronic small vessel ischemic disease. Vascular: No hyperdense vessel. Intracranial arterial calcification. Skull: No fracture or focal lesion. Sinuses/Orbits: No acute finding. Paranasal  sinuses and mastoid air cells are well aerated. Other: None. IMPRESSION: No acute intracranial abnormality. Electronically Signed   By: Placido Sou M.D.   On: 06/29/2022 20:39   DG Chest Port 1 View  Result Date: 06/29/2022 CLINICAL DATA:  Questionable sepsis - evaluate for abnormality EXAM: PORTABLE CHEST 1 VIEW COMPARISON:  04/06/2022 FINDINGS: Large hiatal hernia. Heart is normal size. Increased opacity in the medial right lung base could reflect atelectasis or pneumonia. Probable compressive atelectasis in the left lower lobe adjacent to the hiatal hernia. No effusions or acute bony abnormality. IMPRESSION: Stable large hiatal hernia. Right medial basilar airspace opacity could reflect atelectasis or infiltrate/pneumonia. Electronically Signed   By: Rolm Baptise M.D.   On: 06/29/2022 20:10      IMPRESSION AND PLAN:  Assessment and Plan: Acute metabolic encephalopathy - The patient will be admitted to an observation medical telemetry bed. - We will follow neurochecks every 4 hours for 24 hours. - This could be related to polypharmacy. - She has no strong evidence for pneumonia clinically with no cough respiratory symptoms or fever or leukocytosis with normal lactic acid.  I believe that her chest x-ray findings are suggestive of atelectasis. - We will place her on incentive spirometry. - We will continue monitoring her mental status that is mildly improving.  Parkinson disease ([place]) We will continue Sinemet.  Gout - We will continue allopurinol  Depression - We will can need Zoloft.  Essential hypertension We will continue Norvasc.   DVT prophylaxis: Lovenox.  Advanced Care Planning:  Code Status: full code.  Family Communication:  The plan of care was discussed in details with the patient (and family). I answered all questions. The patient agreed to proceed with the above mentioned plan. Further management will depend upon hospital course. Disposition Plan: Back to previous  home environment Consults called: none.  All the records are reviewed and case discussed with ED provider.  Status is: Observation  I certify that at the time of admission, it is my clinical judgment that the patient will require  hospital care extending less than 2 midnights.                            Dispo: The patient is from: Home              Anticipated d/c is to: Home  Patient currently is not medically stable to d/c.              Difficult to place patient: No  Christel Mormon M.D on 06/29/2022 at 11:24 PM  [facility]   From 7 PM-7 AM, contact night-coverage [email or web address]  CC: Primary care physician; Perrin Maltese, MD

## 2022-06-29 NOTE — ED Triage Notes (Signed)
Pt comes from [facility] via POV with c/o of altered mental status. She was checked for UTI at [facility]. Pt A&O x 2. Disoriented to time and situation. Pt A&O x4 at baseline. Pt more confused than normal

## 2022-06-29 NOTE — Assessment & Plan Note (Signed)
-   We will can need Zoloft.

## 2022-06-29 NOTE — Assessment & Plan Note (Signed)
-   The patient will be admitted to an observation medical telemetry bed. - We will follow neurochecks every 4 hours for 24 hours. - This could be related to polypharmacy. - She has no strong evidence for pneumonia clinically with no cough respiratory symptoms or fever or leukocytosis with normal lactic acid.  I believe that her chest x-ray findings are suggestive of atelectasis. - We will place her on incentive spirometry. - We will continue monitoring her mental status that is mildly improving.

## 2022-06-29 NOTE — ED Provider Notes (Signed)
[facility] Provider Note    Event Date/Time   First MD Initiated Contact with Patient 06/29/22 1940     (approximate)   History   Altered Mental Status   HPI  Yvonne Singleton is a 87 y.o. female   Past medical history of hip surgery, recent uti, hx breast ca, parkinsons, htn here with AMS and confusion and fatigue for 2 days. Was well on Sunday with family.   +HA. No trauma No other focal sx - denies pain, shortness of breath, cough, fever, nausea, vomiting, diarrhea or dysuria.   History was obtained via family members at bedside as well as the patient.  Review of external notes including discharge summary on 05/17/2022 with hospitalization for altered mental status thought to be multifactorial polypharmacy and urinary tract infection      Physical Exam   Triage Vital Signs: ED Triage Vitals  Enc Vitals Group     BP 06/29/22 1923 (!) 147/61     Pulse Rate 06/29/22 1923 74     Resp 06/29/22 1923 18     Temp 06/29/22 1923 97.8 F (36.6 C)     Temp src --      SpO2 06/29/22 1923 96 %     Weight 06/29/22 1924 167 lb (75.8 kg)     Height 06/29/22 1924 5' 3"  (1.6 m)     Head Circumference --      Peak Flow --      Pain Score 06/29/22 1924 0     Pain Loc --      Pain Edu? --      Excl. in [place]? --     Most recent vital signs: Vitals:   06/29/22 2100 06/29/22 2130  BP: 135/65 (!) 140/67  Pulse: 60 (!) 56  Resp: 15 15  Temp:    SpO2: 98% 99%    General: Awake, no distress.  Pleasant and cooperative.  Oriented only to self. CV:  Good peripheral perfusion.  Resp:  Normal effort.  Clear to auscultation bilaterally without focality or wheezing. Abd:  No distention.  Nontender to deep palpation in all quadrants, no rigidity or guarding. Other:  All extremities, sensation intact bilaterally, no facial asymmetry pupils equal reactive, extraocular movements intact.   ED Results / Procedures / Treatments   Labs (all labs ordered  are listed, but only abnormal results are displayed) Labs Reviewed  COMPREHENSIVE METABOLIC PANEL - Abnormal; Notable for the following components:      Result Value   BUN 25 (*)    All other components within normal limits  URINALYSIS, COMPLETE (UACMP) WITH MICROSCOPIC - Abnormal; Notable for the following components:   Color, Urine STRAW (*)    APPearance CLEAR (*)    Hgb urine dipstick MODERATE (*)    Protein, ur 100 (*)    Bacteria, UA RARE (*)    All other components within normal limits  URINE CULTURE  RESP PANEL BY RT-PCR (FLU A&B, COVID) ARPGX2  LACTIC ACID, PLASMA  CBC WITH DIFFERENTIAL/PLATELET  LACTIC ACID, PLASMA  PROCALCITONIN     I reviewed labs and they are notable for rare bacteria in the urine, no leukocytes in the urine.  White blood cell count is normal.  EKG  ED ECG REPORT I, Lucillie Garfinkel, the attending physician, personally viewed and interpreted this ECG.   Date: 06/29/2022  EKG Time: 2006  Rate: 61  Rhythm: normal sinus rhythm  Axis: normal  Intervals:none  ST&T Change: no ischemic  changes    RADIOLOGY I independently reviewed and interpreted CT scan of the head without contrast and see no obvious hemorrhage or midline shift.  Chest x-ray with some right-sided opacities.   PROCEDURES:  Critical Care performed: No  Procedures   MEDICATIONS ORDERED IN ED: Medications  cefTRIAXone (ROCEPHIN) 1 g in sodium chloride 0.9 % 100 mL IVPB (has no administration in time range)  doxycycline (VIBRA-TABS) tablet 100 mg (has no administration in time range)  oxyCODONE (Oxy IR/ROXICODONE) immediate release tablet 5 mg (has no administration in time range)    Consultants:  I spoke with hospitalist re: admission and  regarding care plan for this patient.   IMPRESSION / MDM / ASSESSMENT AND PLAN / ED COURSE  I reviewed the triage vital signs and the nursing notes.                              Differential diagnosis includes, but is not limited to,  infectious or metabolic etiology for altered mental status, check basic labs, chest x-ray and urinalysis.  Patient lives alone with no reports of head trauma but is altered, check CT of the head for intracranial bleeding.  I doubt CVA given no focal neurological deficits.   The patient is on the cardiac monitor to evaluate for evidence of arrhythmia and/or significant heart rate changes.  MDM: Work-up as above reveals a right lung opacity concerning for pneumonia.  Patient is altered and confused but denies chest pain, shortness of breath cough.  No leukocytosis in the emergency department.  We will add on a procalcitonin.  Treat for community-acquired pneumonia.  Etiology of altered mental status at last hospitalization was questionable as well, with urinary tract infection or polypharmacy that was thought to be contributory.  Considering that this patient lives alone and is altered from baseline and unsafe to be discharged, plan for admission and antibiotics And further work-up and treatment.   Patient's presentation is most consistent with acute presentation with potential threat to life or bodily function.       FINAL CLINICAL IMPRESSION(S) / ED DIAGNOSES   Final diagnoses:  Altered mental status, unspecified altered mental status type  Community acquired pneumonia of right middle lobe of lung     Rx / DC Orders   ED Discharge Orders     None        Note:  This document was prepared using Dragon voice recognition software and may include unintentional dictation errors.    Lucillie Garfinkel, MD 06/29/22 2131

## 2022-06-29 NOTE — Assessment & Plan Note (Signed)
We will continue Sinemet.

## 2022-06-29 NOTE — Assessment & Plan Note (Signed)
-   We will continue allopurinol

## 2022-06-29 NOTE — Assessment & Plan Note (Signed)
We will continue Norvasc.

## 2022-06-30 ENCOUNTER — Observation Stay: Payer: Medicare Other

## 2022-06-30 ENCOUNTER — Other Ambulatory Visit: Payer: Self-pay

## 2022-06-30 DIAGNOSIS — G9341 Metabolic encephalopathy: Secondary | ICD-10-CM | POA: Diagnosis not present

## 2022-06-30 DIAGNOSIS — I10 Essential (primary) hypertension: Secondary | ICD-10-CM | POA: Diagnosis not present

## 2022-06-30 DIAGNOSIS — F32A Depression, unspecified: Secondary | ICD-10-CM | POA: Diagnosis not present

## 2022-06-30 LAB — CBC
HCT: 32.7 % — ABNORMAL LOW (ref 36.0–46.0)
Hemoglobin: 10.8 g/dL — ABNORMAL LOW (ref 12.0–15.0)
MCH: 31.3 pg (ref 26.0–34.0)
MCHC: 33 g/dL (ref 30.0–36.0)
MCV: 94.8 fL (ref 80.0–100.0)
Platelets: 210 10*3/uL (ref 150–400)
RBC: 3.45 MIL/uL — ABNORMAL LOW (ref 3.87–5.11)
RDW: 14.7 % (ref 11.5–15.5)
WBC: 7.6 10*3/uL (ref 4.0–10.5)
nRBC: 0 % (ref 0.0–0.2)

## 2022-06-30 LAB — PHOSPHORUS: Phosphorus: 3.8 mg/dL (ref 2.5–4.6)

## 2022-06-30 LAB — BASIC METABOLIC PANEL
Anion gap: 4 — ABNORMAL LOW (ref 5–15)
BUN: 24 mg/dL — ABNORMAL HIGH (ref 8–23)
CO2: 26 mmol/L (ref 22–32)
Calcium: 8.8 mg/dL — ABNORMAL LOW (ref 8.9–10.3)
Chloride: 110 mmol/L (ref 98–111)
Creatinine, Ser: 0.77 mg/dL (ref 0.44–1.00)
GFR, Estimated: >60 mL/min (ref >60)
Glucose, Bld: 93 mg/dL (ref 70–99)
Potassium: 3.8 mmol/L (ref 3.5–5.1)
Sodium: 140 mmol/L (ref 135–145)

## 2022-06-30 LAB — URINE CULTURE: Culture: NO GROWTH

## 2022-06-30 LAB — TSH: TSH: 2.38 u[IU]/mL (ref 0.350–4.500)

## 2022-06-30 LAB — AMMONIA: Ammonia: 21 umol/L (ref 9–35)

## 2022-06-30 LAB — VITAMIN B12: Vitamin B-12: 870 pg/mL (ref 180–914)

## 2022-06-30 MED ORDER — IPRATROPIUM-ALBUTEROL 0.5-2.5 (3) MG/3ML IN SOLN: 3.0000 mL | RESPIRATORY_TRACT | Status: DC | PRN

## 2022-06-30 MED ORDER — SENNOSIDES-DOCUSATE SODIUM 8.6-50 MG PO TABS: 1.0000 | ORAL_TABLET | Freq: Every evening | ORAL | Status: DC | PRN

## 2022-06-30 MED ORDER — GUAIFENESIN 100 MG/5ML PO LIQD: 5.0000 mL | ORAL | Status: DC | PRN

## 2022-06-30 MED ORDER — TRAZODONE HCL 50 MG PO TABS: 50.0000 mg | ORAL_TABLET | Freq: Every evening | ORAL | Status: DC | PRN

## 2022-06-30 MED ORDER — METOPROLOL TARTRATE 5 MG/5ML IV SOLN: 5.0000 mg | INTRAVENOUS | Status: DC | PRN

## 2022-06-30 MED ORDER — HYDRALAZINE HCL 20 MG/ML IJ SOLN: 10.0000 mg | INTRAMUSCULAR | Status: DC | PRN

## 2022-06-30 NOTE — Plan of Care (Signed)
  Problem: Education: Goal: Knowledge of General Education information will improve Description: Including pain rating scale, medication(s)/side effects and non-pharmacologic comfort measures Outcome: Progressing   Problem: Health Behavior/Discharge Planning: Goal: Ability to manage health-related needs will improve Outcome: Progressing   Problem: Clinical Measurements: Goal: Will remain free from infection Outcome: Progressing   Problem: Clinical Measurements: Goal: Respiratory complications will improve Outcome: Progressing   Problem: Clinical Measurements: Goal: Cardiovascular complication will be avoided Outcome: Progressing   Problem: Coping: Goal: Level of anxiety will decrease Outcome: Progressing   Problem: Pain Managment: Goal: General experience of comfort will improve Outcome: Progressing   Problem: Safety: Goal: Ability to remain free from injury will improve Outcome: Progressing   Problem: Skin Integrity: Goal: Risk for impaired skin integrity will decrease Outcome: Progressing

## 2022-06-30 NOTE — Progress Notes (Signed)
PROGRESS NOTE    Yvonne Singleton  DDU:[id number] DOB: 12-Feb-1935 DOA: 06/29/2022 PCP: Perrin Maltese, MD   Brief Narrative:  87 y.o. Caucasian female with medical history significant for Hypertension, Parkinson's disease, right breast cancer status post lumpectomy, who presented to the emergency room with acute onset of altered mental status for the last couple of days with associated confusion.  UA did not show any evidence of acute UTI, chest x-ray showed right lower lobe opacity.   Assessment & Plan:  Active Problems:   Acute metabolic encephalopathy   Parkinson disease (HCC)   Gout   Depression   Essential hypertension     Assessment and Plan: Acute metabolic encephalopathy Expressive aphasia, mild - No clear etiology.  Could be related to polypharmacy.  Neurochecks.  No strong evidence of any underlying infection.  ProCal= neg. UA is negative - Ammonia and TSH normal this morning.  B12, folate pending. Will order MRI brain  Parkinson disease ([place]) Continue home medications  Gout - Allopurinol  Depression - Zoloft.  Essential hypertension Norvasc    DVT prophylaxis: Lovenox Code Status: Full code Family Communication: Daughter at bedside  Status is: Observation  On evaluation for expressive aphasia   Subjective:  Seen and examined at bedside.  Daughter is present at bedside as well.  Patient is able to answer all the basic questions but having difficulty articulating sentences.  Daughter confirms this was also present at bedside.   Examination:  General exam: Appears calm and comfortable  Respiratory system: Clear to auscultation. Respiratory effort normal. Cardiovascular system: S1 & S2 heard, RRR. No JVD, murmurs, rubs, gallops or clicks. No pedal edema. Gastrointestinal system: Abdomen is nondistended, soft and nontender. No organomegaly or masses felt. Normal bowel sounds heard. Central nervous system: Alert and oriented.  No focal  neurodeficits but having some difficulty articulating sentences Extremities: Symmetric 4+ x 5 power. Skin: No rashes, lesions or ulcers Psychiatry: Judgement and insight appear normal. Mood & affect appropriate.     Objective: Vitals:   06/29/22 2328 06/30/22 0302 06/30/22 0546 06/30/22 0826  BP: (!) 151/64  (!) 140/63 (!) 141/61  Pulse: (!) 57  63 63  Resp: 20  18 16   Temp: 98.7 F (37.1 C) 98.7 F (37.1 C) 98.2 F (36.8 C) 99.6 F (37.6 C)  SpO2: 98%  96% 96%  Weight:      Height:        Intake/Output Summary (Last 24 hours) at 06/30/2022 0836 Last data filed at 06/30/2022 2376 Gross per 24 hour  Intake 246.54 ml  Output 450 ml  Net -203.46 ml   Filed Weights   06/29/22 1924  Weight: 75.8 kg     Data Reviewed:   CBC: Recent Labs  Lab 06/29/22 2000 06/30/22 0437  WBC 8.6 7.6  NEUTROABS 5.6  --   HGB 12.8 10.8*  HCT 39.3 32.7*  MCV 96.1 94.8  PLT 243 283   Basic Metabolic Panel: Recent Labs  Lab 06/29/22 2000 06/30/22 0437  NA 138 140  K 4.1 3.8  CL 107 110  CO2 24 26  GLUCOSE 94 93  BUN 25* 24*  CREATININE 0.84 0.77  CALCIUM 9.5 8.8*   GFR: Estimated Creatinine Clearance: 50.2 mL/min (by C-G formula based on SCr of 0.77 mg/dL). Liver Function Tests: Recent Labs  Lab 06/29/22 2000  AST 23  ALT 13  ALKPHOS 107  BILITOT 0.5  PROT 7.0  ALBUMIN 3.8   No results for input(s): "LIPASE", "AMYLASE" in  the last 168 hours. No results for input(s): "AMMONIA" in the last 168 hours. Coagulation Profile: No results for input(s): "INR", "PROTIME" in the last 168 hours. Cardiac Enzymes: No results for input(s): "CKTOTAL", "CKMB", "CKMBINDEX", "TROPONINI" in the last 168 hours. BNP (last 3 results) No results for input(s): "PROBNP" in the last 8760 hours. HbA1C: No results for input(s): "HGBA1C" in the last 72 hours. CBG: No results for input(s): "GLUCAP" in the last 168 hours. Lipid Profile: No results for input(s): "CHOL", "HDL", "LDLCALC",  "TRIG", "CHOLHDL", "LDLDIRECT" in the last 72 hours. Thyroid Function Tests: No results for input(s): "TSH", "T4TOTAL", "FREET4", "T3FREE", "THYROIDAB" in the last 72 hours. Anemia Panel: No results for input(s): "VITAMINB12", "FOLATE", "FERRITIN", "TIBC", "IRON", "RETICCTPCT" in the last 72 hours. Sepsis Labs: Recent Labs  Lab 06/29/22 1941 06/29/22 2000 06/29/22 2209  PROCALCITON  --  <0.10  --   LATICACIDVEN 1.0  --  0.9    Recent Results (from the past 240 hour(s))  Resp Panel by RT-PCR (Flu A&B, Covid) Anterior Nasal Swab     Status: None   Collection Time: 06/29/22 10:09 PM   Specimen: Anterior Nasal Swab  Result Value Ref Range Status   SARS Coronavirus 2 by RT PCR NEGATIVE NEGATIVE Final    Comment: (NOTE) SARS-CoV-2 target nucleic acids are NOT DETECTED.  The SARS-CoV-2 RNA is generally detectable in upper respiratory specimens during the acute phase of infection. The lowest concentration of SARS-CoV-2 viral copies this assay can detect is 138 copies/mL. A negative result does not preclude SARS-Cov-2 infection and should not be used as the sole basis for treatment or other patient management decisions. A negative result may occur with  improper specimen collection/handling, submission of specimen other than nasopharyngeal swab, presence of viral mutation(s) within the areas targeted by this assay, and inadequate number of viral copies(<138 copies/mL). A negative result must be combined with clinical observations, patient history, and epidemiological information. The expected result is Negative.  Fact Sheet for Patients:  EntrepreneurPulse.com.au  Fact Sheet for Healthcare Providers:  IncredibleEmployment.be  This test is no t yet approved or cleared by the [place] FDA and  has been authorized for detection and/or diagnosis of SARS-CoV-2 by FDA under an Emergency Use Authorization (EUA). This EUA will remain  in effect  (meaning this test can be used) for the duration of the COVID-19 declaration under Section 564(b)(1) of the Act, 21 U.S.C.section 360bbb-3(b)(1), unless the authorization is terminated  or revoked sooner.       Influenza A by PCR NEGATIVE NEGATIVE Final   Influenza B by PCR NEGATIVE NEGATIVE Final    Comment: (NOTE) The Xpert Xpress SARS-CoV-2/FLU/RSV plus assay is intended as an aid in the diagnosis of influenza from Nasopharyngeal swab specimens and should not be used as a sole basis for treatment. Nasal washings and aspirates are unacceptable for Xpert Xpress SARS-CoV-2/FLU/RSV testing.  Fact Sheet for Patients: EntrepreneurPulse.com.au  Fact Sheet for Healthcare Providers: IncredibleEmployment.be  This test is not yet approved or cleared by the [place] FDA and has been authorized for detection and/or diagnosis of SARS-CoV-2 by FDA under an Emergency Use Authorization (EUA). This EUA will remain in effect (meaning this test can be used) for the duration of the COVID-19 declaration under Section 564(b)(1) of the Act, 21 U.S.C. section 360bbb-3(b)(1), unless the authorization is terminated or revoked.  Performed at [facility], [address], [place], [place] [address]          Radiology Studies: CT Head  Wo Contrast  Result Date: 06/29/2022 CLINICAL DATA:  Mental status change EXAM: CT HEAD WITHOUT CONTRAST TECHNIQUE: Contiguous axial images were obtained from the base of the skull through the vertex without intravenous contrast. RADIATION DOSE REDUCTION: This exam was performed according to the departmental dose-optimization program which includes automated exposure control, adjustment of the mA and/or kV according to patient size and/or use of iterative reconstruction technique. COMPARISON:  CT head 05/12/2022 FINDINGS: Brain: No intracranial hemorrhage, mass effect, or evidence of acute infarct. No hydrocephalus. No  extra-axial fluid collection. Generalized cerebral atrophy. Ill-defined hypoattenuation within the cerebral white matter is nonspecific but consistent with chronic small vessel ischemic disease. Vascular: No hyperdense vessel. Intracranial arterial calcification. Skull: No fracture or focal lesion. Sinuses/Orbits: No acute finding. Paranasal sinuses and mastoid air cells are well aerated. Other: None. IMPRESSION: No acute intracranial abnormality. Electronically Signed   By: Placido Sou M.D.   On: 06/29/2022 20:39   DG Chest Port 1 View  Result Date: 06/29/2022 CLINICAL DATA:  Questionable sepsis - evaluate for abnormality EXAM: PORTABLE CHEST 1 VIEW COMPARISON:  04/06/2022 FINDINGS: Large hiatal hernia. Heart is normal size. Increased opacity in the medial right lung base could reflect atelectasis or pneumonia. Probable compressive atelectasis in the left lower lobe adjacent to the hiatal hernia. No effusions or acute bony abnormality. IMPRESSION: Stable large hiatal hernia. Right medial basilar airspace opacity could reflect atelectasis or infiltrate/pneumonia. Electronically Signed   By: Rolm Baptise M.D.   On: 06/29/2022 20:10        Scheduled Meds:  allopurinol  200 mg Oral QHS   amLODipine  10 mg Oral Daily   calcium-vitamin D  1 tablet Oral BID   carbidopa-levodopa  1 tablet Oral BID   cholecalciferol  2,000 Units Oral Daily   cyanocobalamin  1,000 mcg Oral Daily   docusate sodium  100 mg Oral BID   enoxaparin (LOVENOX) injection  40 mg Subcutaneous Q24H   gabapentin  300 mg Oral Daily   influenza vaccine adjuvanted  0.5 mL Intramuscular Tomorrow-1000   latanoprost  1 drop Both Eyes QPM   lisinopril  2.5 mg Oral Daily   loratadine  10 mg Oral Daily   midodrine  2.5 mg Oral TID WC   mirabegron ER  25 mg Oral Daily   multivitamin  1 tablet Oral Daily   pantoprazole  40 mg Oral Daily   sertraline  100 mg Oral Daily   Continuous Infusions:  sodium chloride 100 mL/hr at 06/30/22  0307     LOS: 0 days   Time spent= 35 mins    Cormac Wint Arsenio Loader, MD [facility]  If 7PM-7AM, please contact night-coverage  06/30/2022, 8:36 AM

## 2022-06-30 NOTE — Evaluation (Signed)
Occupational Therapy Evaluation Patient Details Name: Yvonne Singleton MRN: [id number] DOB: September 16, 1935 Today's Date: 06/30/2022   History of Present Illness Yvonne Singleton is a 87 y.o. Caucasian female with medical history significant for Hypertension, Parkinson's disease, right breast cancer status post lumpectomy, who presented to the emergency room with acute onset of altered mental status for the last couple of days with associated confusion.   Clinical Impression   Yvonne Singleton was seen for OT evaluation this date. Prior to hospital admission, pt recently d/c from [place] to [place]. Pt requires assist for bathing/dressing and is mobile for limited household distances using RW. Pt presents to acute OT demonstrating impaired ADL performance and functional mobility 2/2 decreased activity tolerance and functional strength/ROM/balance deficits. Pt states her birth month/date but unable to state year. Pt complete the Orientation Log (O-Log) - a quick quantitative measure of orientational status for use at bedside with rehabilitation inpatients. Patient responses are scored based on spontaneous recall, logical cueing, multiple choice, and incorrect responses. Pt scored 20/30.    Pt currently requires MOD A don B socks seated EOB. MIN A + RW sit<>stand and ~10 ft functional mobility. Requires BUE support standing, poor tolerance. Pt would benefit from skilled OT to address noted impairments and functional limitations (see below for any additional details). Upon hospital discharge, recommend HHOT to maximize pt safety and return to PLOF.    Recommendations for follow up therapy are one component of a multi-disciplinary discharge planning process, led by the attending physician.  Recommendations may be updated based on patient status, additional functional criteria and insurance authorization.   Follow Up Recommendations  Home health OT    Assistance Recommended at Discharge Frequent or  constant Supervision/Assistance  Patient can return home with the following A little help with walking and/or transfers;A little help with bathing/dressing/bathroom;Help with stairs or ramp for entrance    Functional Status Assessment  Patient has had a recent decline in their functional status and demonstrates the ability to make significant improvements in function in a reasonable and predictable amount of time.  Equipment Recommendations  None recommended by OT    Recommendations for Other Services       Precautions / Restrictions Precautions Precautions: Fall Restrictions Weight Bearing Restrictions: No      Mobility Bed Mobility Overal bed mobility: Needs Assistance Bed Mobility: Supine to Sit     Supine to sit: Min assist          Transfers Overall transfer level: Needs assistance Equipment used: Rolling walker (2 wheels) Transfers: Sit to/from Stand Sit to Stand: Min assist                  Balance Overall balance assessment: Needs assistance Sitting-balance support: No upper extremity supported, Feet supported Sitting balance-Leahy Scale: Good     Standing balance support: Bilateral upper extremity supported, Reliant on assistive device for balance Standing balance-Leahy Scale: Fair                             ADL either performed or assessed with clinical judgement   ADL Overall ADL's : Needs assistance/impaired                                       General ADL Comments: MOD A don B socks seated EOB. MIN A + RW for simulated toilet  t/f , tolerates ~10 ft. Requires BUE support standing, unable to trial standing grooming      Pertinent Vitals/Pain Pain Assessment Pain Assessment: No/denies pain     Hand Dominance     Extremity/Trunk Assessment Upper Extremity Assessment Upper Extremity Assessment: Overall WFL for tasks assessed   Lower Extremity Assessment Lower Extremity Assessment: Generalized weakness        Communication Communication Communication: HOH   Cognition Arousal/Alertness: Awake/alert Behavior During Therapy: WFL for tasks assessed/performed Overall Cognitive Status: Impaired/Different from baseline Area of Impairment: Orientation, Following commands, Safety/judgement, Problem solving                 Orientation Level: Disoriented to, Place, Time     Following Commands: Follows one step commands consistently Safety/Judgement: Decreased awareness of safety, Decreased awareness of deficits   Problem Solving: Requires verbal cues General Comments: o-log 20/30. Pt states birth date/month but not year.      Home Living Family/patient expects to be discharged to:: Assisted living                             Home Equipment: Rolling Walker (2 wheels);Wheelchair - manual   Additional Comments: [facility]      Prior Functioning/Environment Prior Level of Function : Needs assist             Mobility Comments: walks ~15 ft with RW, transfers to/from w/c for toileting ADLs Comments: assist for dressing/bathing        OT Problem List: Decreased strength;Decreased range of motion;Decreased activity tolerance;Impaired balance (sitting and/or standing);Decreased safety awareness      OT Treatment/Interventions: Self-care/ADL training;Therapeutic exercise;Energy conservation;DME and/or AE instruction;Therapeutic activities;Patient/family education;Balance training    OT Goals(Current goals can be found in the care plan section) Acute Rehab OT Goals Patient Stated Goal: to go home OT Goal Formulation: With patient/family Time For Goal Achievement: 07/14/22 Potential to Achieve Goals: Good ADL Goals Pt Will Perform Grooming: with set-up;with supervision;standing Pt Will Perform Lower Body Dressing: with min assist;sit to/from stand Pt Will Transfer to Toilet: with modified independence;ambulating;bedside commode  OT Frequency: Min 2X/week     Co-evaluation              AM-PAC OT "6 Clicks" Daily Activity     Outcome Measure Help from another person eating meals?: None Help from another person taking care of personal grooming?: A Little Help from another person toileting, which includes using toliet, bedpan, or urinal?: A Lot Help from another person bathing (including washing, rinsing, drying)?: A Little Help from another person to put on and taking off regular upper body clothing?: A Little Help from another person to put on and taking off regular lower body clothing?: A Lot 6 Click Score: 17   End of Session Equipment Utilized During Treatment: Rolling walker (2 wheels)  Activity Tolerance: Patient tolerated treatment well Patient left: in chair;with call bell/phone within reach;with family/visitor present  OT Visit Diagnosis: Muscle weakness (generalized) (M62.81)                Time: 8329-1916 OT Time Calculation (min): 24 min Charges:  OT General Charges $OT Visit: 1 Visit OT Evaluation $OT Eval Moderate Complexity: 1 Mod OT Treatments $Self Care/Home Management : 8-22 mins  Dessie Coma, M.S. OTR/L  06/30/22, 2:09 PM  ascom [phone]

## 2022-07-01 DIAGNOSIS — G20 Parkinson's disease: Secondary | ICD-10-CM | POA: Diagnosis not present

## 2022-07-01 DIAGNOSIS — M109 Gout, unspecified: Secondary | ICD-10-CM | POA: Diagnosis not present

## 2022-07-01 DIAGNOSIS — G9341 Metabolic encephalopathy: Secondary | ICD-10-CM | POA: Diagnosis not present

## 2022-07-01 LAB — BASIC METABOLIC PANEL
Anion gap: 5 (ref 5–15)
BUN: 22 mg/dL (ref 8–23)
CO2: 24 mmol/L (ref 22–32)
Calcium: 8.6 mg/dL — ABNORMAL LOW (ref 8.9–10.3)
Chloride: 112 mmol/L — ABNORMAL HIGH (ref 98–111)
Creatinine, Ser: 0.72 mg/dL (ref 0.44–1.00)
GFR, Estimated: >60 mL/min (ref >60)
Glucose, Bld: 83 mg/dL (ref 70–99)
Potassium: 3.7 mmol/L (ref 3.5–5.1)
Sodium: 141 mmol/L (ref 135–145)

## 2022-07-01 LAB — FOLATE: Folate: 20.6 ng/mL (ref >5.9)

## 2022-07-01 LAB — CBC
HCT: 31.6 % — ABNORMAL LOW (ref 36.0–46.0)
Hemoglobin: 10.4 g/dL — ABNORMAL LOW (ref 12.0–15.0)
MCH: 31.2 pg (ref 26.0–34.0)
MCHC: 32.9 g/dL (ref 30.0–36.0)
MCV: 94.9 fL (ref 80.0–100.0)
Platelets: 169 10*3/uL (ref 150–400)
RBC: 3.33 MIL/uL — ABNORMAL LOW (ref 3.87–5.11)
RDW: 15.2 % (ref 11.5–15.5)
WBC: 6.8 10*3/uL (ref 4.0–10.5)
nRBC: 0 % (ref 0.0–0.2)

## 2022-07-01 LAB — MAGNESIUM: Magnesium: 1.6 mg/dL — ABNORMAL LOW (ref 1.7–2.4)

## 2022-07-01 MED ORDER — OXYCODONE HCL 5 MG PO TABS: 5.0000 mg | ORAL_TABLET | Freq: Four times a day (QID) | ORAL | 0 refills | Status: DC | PRN

## 2022-07-01 NOTE — Discharge Summary (Signed)
Physician Discharge Summary  Yvonne Singleton ZHG:[id number] DOB: Aug 08, 1935 DOA: 06/29/2022  PCP: Perrin Maltese, MD  Admit date: 06/29/2022 Discharge date: 07/01/2022  Admitted From: ALF Disposition:  ALF  Recommendations for Outpatient Follow-up:  Follow up with PCP in 1-2 weeks Please obtain BMP/CBC in one week your next doctors visit.     Discharge Condition: Stable CODE STATUS: Full code Diet recommendation: 2 g salt  Brief/Interim Summary: 87 y.o. Caucasian female with medical history significant for Hypertension, Parkinson's disease, right breast cancer status post lumpectomy, who presented to the emergency room with acute onset of altered mental status for the last couple of days with associated confusion.  UA did not show any evidence of acute UTI, chest x-ray showed right lower lobe opacity.  MRI of the brain was unremarkable.  Patient's metabolic work-up was negative including UA.  With hydration her mentation improved.  Today medically she is stable for discharge.     Assessment & Plan:  Active Problems:   Acute metabolic encephalopathy   Parkinson disease (HCC)   Gout   Depression   Essential hypertension       Assessment and Plan: Acute metabolic encephalopathy, resolved Expressive aphasia, mild, resolved - No clear etiology.  Could be related to polypharmacy.  Neurochecks.  No strong evidence of any underlying infection.  ProCal= neg. UA is negative - Ammonia and TSH normal this morning.  B12, folate is also normal.  MRI brain unremarkable for any acute pathology. Patient is back to baseline.   Parkinson disease ([place]) Continue home medications   Gout - Allopurinol   Depression - Zoloft.   Essential hypertension Norvasc    Discharge Diagnoses:  Active Problems:   Acute metabolic encephalopathy   Parkinson disease ([place])   Gout   Depression   Essential hypertension      Consultations: None  Subjective: Patient feels much better and  back to baseline.  No complaints this morning.  Discharge Exam: Vitals:   07/01/22 0747 07/01/22 1157  BP: (!) 149/56 (!) 150/57  Pulse: (!) 57 60  Resp: 17   Temp: 98.1 F (36.7 C)   SpO2: 96%    Vitals:   06/30/22 1553 06/30/22 2318 07/01/22 0747 07/01/22 1157  BP: (!) 123/58 (!) 120/54 (!) 149/56 (!) 150/57  Pulse: (!) 57 (!) 58 (!) 57 60  Resp: 17 18 17    Temp: 98.7 F (37.1 C) 98.9 F (37.2 C) 98.1 F (36.7 C)   SpO2: 97% 93% 96%   Weight:      Height:        General: Pt is alert, awake, not in acute distress Cardiovascular: RRR, S1/S2 +, no rubs, no gallops Respiratory: CTA bilaterally, no wheezing, no rhonchi Abdominal: Soft, NT, ND, bowel sounds + Extremities: no edema, no cyanosis  Discharge Instructions   Allergies as of 07/01/2022       Reactions   Trospium Nausea And Vomiting   Digoxin    Other reaction(s): Unknown Note: pt had lethargy, felt bad Note: pt had lethargy, felt bad Other reaction(s): Unknown Note: pt had lethargy, felt bad Other reaction(s): Unknown Note: pt had lethargy, felt bad Note: pt had lethargy, felt bad   Iodinated Contrast Media Hives   Streptomycin Hives        Medication List     TAKE these medications    acetaminophen 500 MG tablet Commonly known as: TYLENOL Take 2 tablets (1,000 mg total) by mouth every 8 (eight) hours.   allopurinol 100 MG  tablet Commonly known as: ZYLOPRIM Take 200 mg by mouth at bedtime.   amLODipine 10 MG tablet Commonly known as: NORVASC Take 1 tablet (10 mg total) by mouth daily.   Aspercreme Lidocaine 4 % Generic drug: lidocaine Place 1 patch onto the skin every 12 (twelve) hours.   b complex vitamins tablet Take 1 tablet by mouth daily.   Calcium Carbonate-Vitamin D 600-200 MG-UNIT Tabs Take 1 tablet by mouth 2 (two) times daily.   carbidopa-levodopa 50-200 MG tablet Commonly known as: SINEMET CR Take 1 tablet by mouth 2 (two) times daily.   cetirizine 5 MG  tablet Commonly known as: ZYRTEC 5 mg DAILY (route: oral)   cholecalciferol 1000 units tablet Commonly known as: VITAMIN D Take 2,000 Units by mouth daily.   clotrimazole 1 % cream Commonly known as: LOTRIMIN Apply topically 2 (two) times daily.   cyanocobalamin 1000 MCG tablet Take 1 tablet (1,000 mcg total) by mouth daily.   docusate sodium 100 MG capsule Commonly known as: COLACE Take 1 capsule (100 mg total) by mouth 2 (two) times daily.   feeding supplement Liqd Take 237 mLs by mouth 2 (two) times daily between meals.   gabapentin 300 MG capsule Commonly known as: NEURONTIN Take 1 capsule (300 mg total) by mouth 2 (two) times daily. What changed: when to take this   latanoprost 0.005 % ophthalmic solution Commonly known as: XALATAN Place 1 drop into both eyes every evening.   lisinopril 2.5 MG tablet Commonly known as: ZESTRIL Take 2.5 mg by mouth daily.   midodrine 2.5 MG tablet Commonly known as: PROAMATINE Take 1 tablet (2.5 mg total) by mouth 3 (three) times daily with meals. HOLD IF SYSTOLIC BP > 081   Myrbetriq 25 MG Tb24 tablet Generic drug: mirabegron ER Take 25 mg by mouth daily.   oxyCODONE 5 MG immediate release tablet Commonly known as: Oxy IR/ROXICODONE Take 1 tablet (5 mg total) by mouth every 6 (six) hours as needed for severe pain or moderate pain.   pantoprazole 40 MG tablet Commonly known as: PROTONIX Take 1 tablet (40 mg total) by mouth 2 (two) times daily before a meal. What changed: when to take this   polyethylene glycol 17 g packet Commonly known as: MIRALAX / GLYCOLAX Take 17 g by mouth 2 (two) times daily.   senna-docusate 8.6-50 MG tablet Commonly known as: Senokot-S Take 1 tablet by mouth at bedtime as needed for mild constipation.   sertraline 100 MG tablet Commonly known as: ZOLOFT Take 100 mg by mouth daily.   tolterodine 2 MG 24 hr capsule Commonly known as: DETROL LA Take 2 mg by mouth daily.        Follow-up  Information     Perrin Maltese, MD Follow up on 07/05/2022.   Specialty: Internal Medicine Why: Appointment on 07/05/22 @11 :15am. Contact information: [place] [address] [phone]                Allergies  Allergen Reactions   Trospium Nausea And Vomiting   Digoxin     Other reaction(s): Unknown Note: pt had lethargy, felt bad Note: pt had lethargy, felt bad  Other reaction(s): Unknown Note: pt had lethargy, felt bad  Other reaction(s): Unknown Note: pt had lethargy, felt bad Note: pt had lethargy, felt bad   Iodinated Contrast Media Hives   Streptomycin Hives    You were cared for by a hospitalist during your hospital stay. If you have any questions about your  discharge medications or the care you received while you were in the hospital after you are discharged, you can call the unit and asked to speak with the hospitalist on call if the hospitalist that took care of you is not available. Once you are discharged, your primary care physician will handle any further medical issues. Please note that no refills for any discharge medications will be authorized once you are discharged, as it is imperative that you return to your primary care physician (or establish a relationship with a primary care physician if you do not have one) for your aftercare needs so that they can reassess your need for medications and monitor your lab values.   Procedures/Studies: MR BRAIN WO CONTRAST  Result Date: 06/30/2022 CLINICAL DATA:  Transient ischemic attack. Parkinson's disease. Altered mental status. EXAM: MRI HEAD WITHOUT CONTRAST TECHNIQUE: Multiplanar, multiecho pulse sequences of the brain and surrounding structures were obtained without intravenous contrast. COMPARISON:  CT 06/29/2022.  MRI 01/28/2022. FINDINGS: Brain: Diffusion imaging does not show any acute or subacute infarction. Mild chronic small-vessel ischemic change affects the pons. Few old small vessel  cerebellar infarctions. Cerebral hemispheres show moderate chronic small-vessel ischemic change of the deep and subcortical white matter. No large vessel territory stroke. No mass lesion, hemorrhage, hydrocephalus or extra-axial collection. Vascular: Major vessels at the base of the brain show flow. Skull and upper cervical spine: Negative Sinuses/Orbits: Clear/normal Other: None IMPRESSION: No acute or reversible finding. Chronic small-vessel ischemic changes as seen previously. Electronically Signed   By: Nelson Chimes M.D.   On: 06/30/2022 13:07   CT Head Wo Contrast  Result Date: 06/29/2022 CLINICAL DATA:  Mental status change EXAM: CT HEAD WITHOUT CONTRAST TECHNIQUE: Contiguous axial images were obtained from the base of the skull through the vertex without intravenous contrast. RADIATION DOSE REDUCTION: This exam was performed according to the departmental dose-optimization program which includes automated exposure control, adjustment of the mA and/or kV according to patient size and/or use of iterative reconstruction technique. COMPARISON:  CT head 05/12/2022 FINDINGS: Brain: No intracranial hemorrhage, mass effect, or evidence of acute infarct. No hydrocephalus. No extra-axial fluid collection. Generalized cerebral atrophy. Ill-defined hypoattenuation within the cerebral white matter is nonspecific but consistent with chronic small vessel ischemic disease. Vascular: No hyperdense vessel. Intracranial arterial calcification. Skull: No fracture or focal lesion. Sinuses/Orbits: No acute finding. Paranasal sinuses and mastoid air cells are well aerated. Other: None. IMPRESSION: No acute intracranial abnormality. Electronically Signed   By: Placido Sou M.D.   On: 06/29/2022 20:39   DG Chest Port 1 View  Result Date: 06/29/2022 CLINICAL DATA:  Questionable sepsis - evaluate for abnormality EXAM: PORTABLE CHEST 1 VIEW COMPARISON:  04/06/2022 FINDINGS: Large hiatal hernia. Heart is normal size. Increased  opacity in the medial right lung base could reflect atelectasis or pneumonia. Probable compressive atelectasis in the left lower lobe adjacent to the hiatal hernia. No effusions or acute bony abnormality. IMPRESSION: Stable large hiatal hernia. Right medial basilar airspace opacity could reflect atelectasis or infiltrate/pneumonia. Electronically Signed   By: Rolm Baptise M.D.   On: 06/29/2022 20:10     The results of significant diagnostics from this hospitalization (including imaging, microbiology, ancillary and laboratory) are listed below for reference.     Microbiology: Recent Results (from the past 240 hour(s))  Urine Culture     Status: None   Collection Time: 06/29/22  7:41 PM   Specimen: In/Out Cath Urine  Result Value Ref Range Status   Specimen Description  Final    IN/OUT CATH URINE Performed at [facility], [address] [place], [place] [address]    Special Requests   Final    NONE Performed at [facility], [address] [place], [place] [address]    Culture   Final    NO GROWTH Performed at [facility], [place] [address], [place], [place] [address]    Report Status 06/30/2022 FINAL  Final  Resp Panel by RT-PCR (Flu A&B, Covid) Anterior Nasal Swab     Status: None   Collection Time: 06/29/22 10:09 PM   Specimen: Anterior Nasal Swab  Result Value Ref Range Status   SARS Coronavirus 2 by RT PCR NEGATIVE NEGATIVE Final    Comment: (NOTE) SARS-CoV-2 target nucleic acids are NOT DETECTED.  The SARS-CoV-2 RNA is generally detectable in upper respiratory specimens during the acute phase of infection. The lowest concentration of SARS-CoV-2 viral copies this assay can detect is 138 copies/mL. A negative result does not preclude SARS-Cov-2 infection and should not be used as the sole basis for treatment or other patient management decisions. A negative result may occur with  improper specimen collection/handling, submission of specimen  other than nasopharyngeal swab, presence of viral mutation(s) within the areas targeted by this assay, and inadequate number of viral copies(<138 copies/mL). A negative result must be combined with clinical observations, patient history, and epidemiological information. The expected result is Negative.  Fact Sheet for Patients:  EntrepreneurPulse.com.au  Fact Sheet for Healthcare Providers:  IncredibleEmployment.be  This test is no t yet approved or cleared by the [place] FDA and  has been authorized for detection and/or diagnosis of SARS-CoV-2 by FDA under an Emergency Use Authorization (EUA). This EUA will remain  in effect (meaning this test can be used) for the duration of the COVID-19 declaration under Section 564(b)(1) of the Act, 21 U.S.C.section 360bbb-3(b)(1), unless the authorization is terminated  or revoked sooner.       Influenza A by PCR NEGATIVE NEGATIVE Final   Influenza B by PCR NEGATIVE NEGATIVE Final    Comment: (NOTE) The Xpert Xpress SARS-CoV-2/FLU/RSV plus assay is intended as an aid in the diagnosis of influenza from Nasopharyngeal swab specimens and should not be used as a sole basis for treatment. Nasal washings and aspirates are unacceptable for Xpert Xpress SARS-CoV-2/FLU/RSV testing.  Fact Sheet for Patients: EntrepreneurPulse.com.au  Fact Sheet for Healthcare Providers: IncredibleEmployment.be  This test is not yet approved or cleared by the [place] FDA and has been authorized for detection and/or diagnosis of SARS-CoV-2 by FDA under an Emergency Use Authorization (EUA). This EUA will remain in effect (meaning this test can be used) for the duration of the COVID-19 declaration under Section 564(b)(1) of the Act, 21 U.S.C. section 360bbb-3(b)(1), unless the authorization is terminated or revoked.  Performed at [facility], [address]  [place], [place] [address]      Labs: BNP (last 3 results) No results for input(s): "BNP" in the last 8760 hours. Basic Metabolic Panel: Recent Labs  Lab 06/29/22 2000 06/30/22 0437 06/30/22 0855 07/01/22 0549  NA 138 140  --  141  K 4.1 3.8  --  3.7  CL 107 110  --  112*  CO2 24 26  --  24  GLUCOSE 94 93  --  83  BUN 25* 24*  --  22  CREATININE 0.84 0.77  --  0.72  CALCIUM 9.5 8.8*  --  8.6*  MG  --   --   --  1.6*  PHOS  --   --  3.8  --    Liver Function Tests: Recent Labs  Lab 06/29/22 2000  AST 23  ALT 13  ALKPHOS 107  BILITOT 0.5  PROT 7.0  ALBUMIN 3.8   No results for input(s): "LIPASE", "AMYLASE" in the last 168 hours. Recent Labs  Lab 06/30/22 0855  AMMONIA 21   CBC: Recent Labs  Lab 06/29/22 2000 06/30/22 0437 07/01/22 0549  WBC 8.6 7.6 6.8  NEUTROABS 5.6  --   --   HGB 12.8 10.8* 10.4*  HCT 39.3 32.7* 31.6*  MCV 96.1 94.8 94.9  PLT 243 210 169   Cardiac Enzymes: No results for input(s): "CKTOTAL", "CKMB", "CKMBINDEX", "TROPONINI" in the last 168 hours. BNP: Invalid input(s): "POCBNP" CBG: No results for input(s): "GLUCAP" in the last 168 hours. D-Dimer No results for input(s): "DDIMER" in the last 72 hours. Hgb A1c No results for input(s): "HGBA1C" in the last 72 hours. Lipid Profile No results for input(s): "CHOL", "HDL", "LDLCALC", "TRIG", "CHOLHDL", "LDLDIRECT" in the last 72 hours. Thyroid function studies Recent Labs    06/30/22 0855  TSH 2.380   Anemia work up Recent Labs    06/30/22 0855 07/01/22 0549  VITAMINB12 870  --   FOLATE  --  20.6   Urinalysis    Component Value Date/Time   COLORURINE STRAW (A) 06/29/2022 1941   APPEARANCEUR CLEAR (A) 06/29/2022 1941   APPEARANCEUR Clear 09/30/2014 1508   LABSPEC 1.005 06/29/2022 1941   LABSPEC 1.006 09/30/2014 1508   PHURINE 5.0 06/29/2022 1941   GLUCOSEU NEGATIVE 06/29/2022 1941   GLUCOSEU Negative 09/30/2014 1508   HGBUR MODERATE (A) 06/29/2022 1941   BILIRUBINUR  NEGATIVE 06/29/2022 1941   BILIRUBINUR Negative 09/30/2014 [place] NEGATIVE 06/29/2022 1941   PROTEINUR 100 (A) 06/29/2022 1941   NITRITE NEGATIVE 06/29/2022 1941   LEUKOCYTESUR NEGATIVE 06/29/2022 1941   LEUKOCYTESUR 2+ 09/30/2014 1508   Sepsis Labs Recent Labs  Lab 06/29/22 2000 06/30/22 0437 07/01/22 0549  WBC 8.6 7.6 6.8   Microbiology Recent Results (from the past 240 hour(s))  Urine Culture     Status: None   Collection Time: 06/29/22  7:41 PM   Specimen: In/Out Cath Urine  Result Value Ref Range Status   Specimen Description   Final    IN/OUT CATH URINE Performed at [facility], [address] [place], [place] [address]    Special Requests   Final    NONE Performed at [facility], [address] [place], [place] [address]    Culture   Final    NO GROWTH Performed at [facility], [place] [address], [place], [place] [address]    Report Status 06/30/2022 FINAL  Final  Resp Panel by RT-PCR (Flu A&B, Covid) Anterior Nasal Swab     Status: None   Collection Time: 06/29/22 10:09 PM   Specimen: Anterior Nasal Swab  Result Value Ref Range Status   SARS Coronavirus 2 by RT PCR NEGATIVE NEGATIVE Final    Comment: (NOTE) SARS-CoV-2 target nucleic acids are NOT DETECTED.  The SARS-CoV-2 RNA is generally detectable in upper respiratory specimens during the acute phase of infection. The lowest concentration of SARS-CoV-2 viral copies this assay can detect is 138 copies/mL. A negative result does not preclude SARS-Cov-2 infection and should not be used as the sole basis for treatment or other patient management decisions. A negative result may occur with  improper specimen collection/handling, submission of specimen  other than nasopharyngeal swab, presence of viral mutation(s) within the areas targeted by this assay, and inadequate number of viral copies(<138 copies/mL). A negative result must be combined with clinical observations,  patient history, and epidemiological information. The expected result is Negative.  Fact Sheet for Patients:  EntrepreneurPulse.com.au  Fact Sheet for Healthcare Providers:  IncredibleEmployment.be  This test is no t yet approved or cleared by the [place] FDA and  has been authorized for detection and/or diagnosis of SARS-CoV-2 by FDA under an Emergency Use Authorization (EUA). This EUA will remain  in effect (meaning this test can be used) for the duration of the COVID-19 declaration under Section 564(b)(1) of the Act, 21 U.S.C.section 360bbb-3(b)(1), unless the authorization is terminated  or revoked sooner.       Influenza A by PCR NEGATIVE NEGATIVE Final   Influenza B by PCR NEGATIVE NEGATIVE Final    Comment: (NOTE) The Xpert Xpress SARS-CoV-2/FLU/RSV plus assay is intended as an aid in the diagnosis of influenza from Nasopharyngeal swab specimens and should not be used as a sole basis for treatment. Nasal washings and aspirates are unacceptable for Xpert Xpress SARS-CoV-2/FLU/RSV testing.  Fact Sheet for Patients: EntrepreneurPulse.com.au  Fact Sheet for Healthcare Providers: IncredibleEmployment.be  This test is not yet approved or cleared by the [place] FDA and has been authorized for detection and/or diagnosis of SARS-CoV-2 by FDA under an Emergency Use Authorization (EUA). This EUA will remain in effect (meaning this test can be used) for the duration of the COVID-19 declaration under Section 564(b)(1) of the Act, 21 U.S.C. section 360bbb-3(b)(1), unless the authorization is terminated or revoked.  Performed at [facility], [address] [place], [place] [address]      Time coordinating discharge:  I have spent 35 minutes face to face with the patient and on the ward discussing the patients care, assessment, plan and disposition with other care givers. >50% of the  time was devoted counseling the patient about the risks and benefits of treatment/Discharge disposition and coordinating care.   SIGNED:   Damita Lack, MD  [facility] 07/01/2022, 2:00 PM   If 7PM-7AM, please contact night-coverage

## 2022-07-01 NOTE — Progress Notes (Addendum)
Pt discharged back to [facility] driven by daughter via personal car. Personal belongin

## 2022-07-01 NOTE — Plan of Care (Signed)
  Problem: Education: Goal: Knowledge of General Education information will improve Description: Including pain rating scale, medication(s)/side effects and non-pharmacologic comfort measures Outcome: Progressing   Problem: Health Behavior/Discharge Planning: Goal: Ability to manage health-related needs will improve Outcome: Progressing   Problem: Clinical Measurements: Goal: Respiratory complications will improve Outcome: Progressing   Problem: Clinical Measurements: Goal: Cardiovascular complication will be avoided Outcome: Progressing   Problem: Nutrition: Goal: Adequate nutrition will be maintained Outcome: Progressing   Problem: Coping: Goal: Level of anxiety will decrease Outcome: Progressing   Problem: Pain Managment: Goal: General experience of comfort will improve Outcome: Progressing   Problem: Safety: Goal: Ability to remain free from injury will improve Outcome: Progressing   Problem: Skin Integrity: Goal: Risk for impaired skin integrity will decrease Outcome: Progressing

## 2022-07-01 NOTE — Plan of Care (Signed)

## 2022-07-01 NOTE — Progress Notes (Addendum)
Spoke to the patient's daughter Eustaquio Maize She stated that the patient resides at [facility] She has a wheelchair and a walker at [place] and does not need additional DME She will need [place] set up I notifiedCheryl at [employer] who she is already open with Her daughter or her son will transport back to [place] once Discharged I called and left a VM for [place] at [place] for a return call

## 2022-07-01 NOTE — Evaluation (Signed)
Physical Therapy Evaluation Patient Details Name: Yvonne Singleton MRN: [id number] DOB: 1935-05-13 Today's Date: 07/01/2022  History of Present Illness  Yvonne Singleton is a 87 y.o. Caucasian female with medical history significant for Hypertension, Parkinson's disease, right breast cancer status post lumpectomy, who presented to the emergency room with acute onset of altered mental status for the last couple of days with associated confusion.   Clinical Impression  Patient received in bed, she is agreeable to PT assessment. Patient required min A for bed mobility. Min A for transfers and min guard for ambulation of 3 feet in room to recliner. She has R LE weakness. Cognitively, appears at baseline. Patient will continue to benefit from skilled PT to improve strength, safety and independence.         Recommendations for follow up therapy are one component of a multi-disciplinary discharge planning process, led by the attending physician.  Recommendations may be updated based on patient status, additional functional criteria and insurance authorization.  Follow Up Recommendations Home health PT      Assistance Recommended at Discharge Frequent or constant Supervision/Assistance  Patient can return home with the following  A little help with walking and/or transfers;A little help with bathing/dressing/bathroom;Assist for transportation    Equipment Recommendations None recommended by PT  Recommendations for Other Services       Functional Status Assessment Patient has had a recent decline in their functional status and demonstrates the ability to make significant improvements in function in a reasonable and predictable amount of time.     Precautions / Restrictions Precautions Precautions: Fall Restrictions Weight Bearing Restrictions: No      Mobility  Bed Mobility Overal bed mobility: Needs Assistance Bed Mobility: Supine to Sit     Supine to sit: Min assist      General bed mobility comments: min assist R LE weak, min A to bring trunk up to sitting position    Transfers Overall transfer level: Needs assistance Equipment used: Rolling walker (2 wheels) Transfers: Sit to/from Stand Sit to Stand: Min assist                Ambulation/Gait Ambulation/Gait assistance: Min guard Gait Distance (Feet): 3 Feet Assistive device: Rolling walker (2 wheels) Gait Pattern/deviations: Step-to pattern, Decreased step length - right, Decreased stride length Gait velocity: decr     General Gait Details: patient having more difficulty advancing R LE due to weakness.  Stairs            Wheelchair Mobility    Modified Rankin (Stroke Patients Only)       Balance Overall balance assessment: Needs assistance Sitting-balance support: Feet supported Sitting balance-Leahy Scale: Fair Sitting balance - Comments: posteror or right lean with challenges in sitting Postural control: Right lateral lean, Posterior lean Standing balance support: During functional activity, Bilateral upper extremity supported, Reliant on assistive device for balance Standing balance-Leahy Scale: Fair                               Pertinent Vitals/Pain Pain Assessment Pain Assessment: No/denies pain    Home Living Family/patient expects to be discharged to:: Assisted living                 Home Equipment: Conservation officer, nature (2 wheels);Wheelchair - manual Additional Comments: [facility]    Prior Function Prior Level of Function : Needs assist       Physical Assist : ADLs (physical)  Mobility Comments: walks ~15 ft with RW, transfers to/from w/c for toileting at baseline ADLs Comments: assist for dressing/bathing     Hand Dominance        Extremity/Trunk Assessment   Upper Extremity Assessment Upper Extremity Assessment: Defer to OT evaluation    Lower Extremity Assessment Lower Extremity Assessment: Generalized weakness     Cervical / Trunk Assessment Cervical / Trunk Assessment: Normal  Communication   Communication: HOH  Cognition Arousal/Alertness: Awake/alert Behavior During Therapy: WFL for tasks assessed/performed Overall Cognitive Status: Within Functional Limits for tasks assessed                         Following Commands: Follows one step commands consistently Safety/Judgement: Decreased awareness of safety, Decreased awareness of deficits   Problem Solving: Requires verbal cues          General Comments      Exercises     Assessment/Plan    PT Assessment Patient needs continued PT services  PT Problem List Decreased strength;Decreased mobility;Decreased activity tolerance       PT Treatment Interventions DME instruction;Therapeutic exercise;Gait training;Functional mobility training;Patient/family education;Therapeutic activities    PT Goals (Current goals can be found in the Care Plan section)  Acute Rehab PT Goals Patient Stated Goal: to return home today PT Goal Formulation: With patient Time For Goal Achievement: 07/08/22 Potential to Achieve Goals: Good    Frequency Min 2X/week     Co-evaluation               AM-PAC PT "6 Clicks" Mobility  Outcome Measure Help needed turning from your back to your side while in a flat bed without using bedrails?: A Little Help needed moving from lying on your back to sitting on the side of a flat bed without using bedrails?: A Little Help needed moving to and from a bed to a chair (including a wheelchair)?: A Little Help needed standing up from a chair using your arms (e.g., wheelchair or bedside chair)?: A Little Help needed to walk in hospital room?: A Little Help needed climbing 3-5 steps with a railing? : A Lot 6 Click Score: 17    End of Session Equipment Utilized During Treatment: Gait belt Activity Tolerance: Patient tolerated treatment well;Patient limited by fatigue Patient left: in chair;with call  bell/phone within reach Nurse Communication: Mobility status PT Visit Diagnosis: Other abnormalities of gait and mobility (R26.89);Difficulty in walking, not elsewhere classified (R26.2);Muscle weakness (generalized) (M62.81)    Time: 5573-2202 PT Time Calculation (min) (ACUTE ONLY): 15 min   Charges:   PT Evaluation $PT Eval Moderate Complexity: 1 Mod          Jartavious Mckimmy, PT, GCS 07/01/22,10:42 AM

## 2022-07-01 NOTE — Progress Notes (Signed)
Occupational Therapy Treatment Patient Details Name: Yvonne Singleton MRN: [id number] DOB: Mar 05, 1935 Today's Date: 07/01/2022   History of present illness Yvonne Singleton is a 87 y.o. Caucasian female with medical history significant for Hypertension, Parkinson's disease, right breast cancer status post lumpectomy, who presented to the emergency room with acute onset of altered mental status for the last couple of days with associated confusion.   OT comments  Pt seen for OT tx. Pt required MIN-MOD A for SPT and step pivot transfer recliner<>BSC with RW and VC for sequencing. Pt required BUE Support on the RW to maintain static standing balance, resulting in need for MAX A for LB dressing and pericare. Set up for UB dressing. Continue to recommend [place].    Recommendations for follow up therapy are one component of a multi-disciplinary discharge planning process, led by the attending physician.  Recommendations may be updated based on patient status, additional functional criteria and insurance authorization.    Follow Up Recommendations  Home health OT    Assistance Recommended at Discharge Frequent or constant Supervision/Assistance  Patient can return home with the following  A little help with walking and/or transfers;Help with stairs or ramp for entrance;A lot of help with bathing/dressing/bathroom   Equipment Recommendations       Recommendations for Other Services      Precautions / Restrictions Precautions Precautions: Fall Restrictions Weight Bearing Restrictions: No       Mobility Bed Mobility               General bed mobility comments: NT    Transfers Overall transfer level: Needs assistance Equipment used: Rolling walker (2 wheels) Transfers: Sit to/from Stand Sit to Stand: Min assist, Mod assist                 Balance Overall balance assessment: Needs assistance Sitting-balance support: Feet supported Sitting balance-Leahy Scale:  Fair     Standing balance support: During functional activity, Bilateral upper extremity supported, Reliant on assistive device for balance Standing balance-Leahy Scale: Poor                             ADL either performed or assessed with clinical judgement   ADL                                         General ADL Comments: Pt required MAX A to don underwear and shorts in standing 2/2 poor balance and strength, MAX A for pericare in standing, set up for UB dressing from seated position    Extremity/Trunk Assessment Upper Extremity Assessment Upper Extremity Assessment: Defer to OT evaluation   Lower Extremity Assessment Lower Extremity Assessment: Generalized weakness   Cervical / Trunk Assessment Cervical / Trunk Assessment: Normal    Vision       Perception     Praxis      Cognition Arousal/Alertness: Awake/alert Behavior During Therapy: WFL for tasks assessed/performed Overall Cognitive Status: Within Functional Limits for tasks assessed                                          Exercises      Shoulder Instructions       General Comments  Pertinent Vitals/ Pain          Home Living Family/patient expects to be discharged to:: Assisted living                             Home Equipment: Conservation officer, nature (2 wheels);Wheelchair - manual   Additional Comments: [facility]      Prior Functioning/Environment              Frequency  Min 2X/week        Progress Toward Goals  OT Goals(current goals can now be found in the care plan section)  Progress towards OT goals: OT to reassess next treatment  Acute Rehab OT Goals Patient Stated Goal: to go home OT Goal Formulation: With patient/family Time For Goal Achievement: 07/14/22 Potential to Achieve Goals: Good  Plan Discharge plan remains appropriate;Frequency remains appropriate    Co-evaluation                 AM-PAC  OT "6 Clicks" Daily Activity     Outcome Measure   Help from another person eating meals?: None Help from another person taking care of personal grooming?: A Little Help from another person toileting, which includes using toliet, bedpan, or urinal?: A Lot Help from another person bathing (including washing, rinsing, drying)?: A Little Help from another person to put on and taking off regular upper body clothing?: A Little Help from another person to put on and taking off regular lower body clothing?: A Lot 6 Click Score: 17    End of Session Equipment Utilized During Treatment: Rolling walker (2 wheels)  OT Visit Diagnosis: Muscle weakness (generalized) (M62.81)   Activity Tolerance Patient tolerated treatment well   Patient Left in chair;with call bell/phone within reach;with family/visitor present   Nurse Communication          Time: 3212-2482 OT Time Calculation (min): 18 min  Charges: OT General Charges $OT Visit: 1 Visit OT Treatments $Self Care/Home Management : 8-22 mins  Ardeth Perfect., MPH, MS, OTR/L ascom [phone] 07/01/22, 1:10 PM

## 2022-07-01 NOTE — NC FL2 (Signed)
[place] LEVEL OF CARE SCREENING TOOL     IDENTIFICATION  Patient Name: Yvonne Singleton Birthdate: 04-06-1935 Sex: female Admission Date (Current Location): 06/29/2022  [facility] and [place] Number:  Engineering geologist and Address:  [facility], [address] [place], [place] [address]      Provider Number: [id number]  Attending Physician Name and Address:  Damita Lack, MD  Relative Name and Phone Number:  Beth    Current Level of Care:   Recommended Level of Care: Assisted Living Facility Prior Approval Number:    Date Approved/Denied:   PASRR Number:    Discharge Plan:  (assisted Living)    Current Diagnoses: Patient Active Problem List   Diagnosis Date Noted   Acute metabolic encephalopathy 78/67/5449   Essential hypertension 06/29/2022   Vitamin B12 deficiency 05/14/2022   Recurrent dislocation, right hip 04/06/2022   Gout 04/06/2022   Depression 04/06/2022   Paraesophageal hernia    Gastritis without bleeding    Regional enteritis of small bowel ([place]) 10/24/2018   Clostridium difficile infection 10/24/2018   Lower extremity edema 06/27/2018   Hyperlipidemia 05/30/2017   Hypertension 05/30/2017   GERD (gastroesophageal reflux disease) 05/30/2017   Parkinson disease ([place]) 10/29/2016   History of bilateral hip replacements 05/28/2015   Spinal stenosis, lumbar region, with neurogenic claudication 05/28/2015   DDD (degenerative disc disease), lumbar 03/13/2015   Lumbar post-laminectomy syndrome 03/13/2015   Degenerative cervical disc 03/13/2015   Status post bilateral total hip replacement 03/13/2015   Sacroiliac joint dysfunction 03/13/2015   History of surgery on upper extremity 03/13/2015   DJD of shoulder 03/13/2015   Rheumatoid arthritis ([place]) 02/17/2015    Orientation RESPIRATION BLADDER Height & Weight     Self, Place  Normal Continent Weight: 75.8 kg Height:  5' 3"  (160 cm)  BEHAVIORAL  SYMPTOMS/MOOD NEUROLOGICAL BOWEL NUTRITION STATUS      Continent Diet (see dc summary)  AMBULATORY STATUS COMMUNICATION OF NEEDS Skin   Limited Assist Verbally Normal                       Personal Care Assistance Level of Assistance  Bathing, Feeding, Dressing Bathing Assistance: Limited assistance Feeding assistance: Independent Dressing Assistance: Limited assistance     Functional Limitations Info             SPECIAL CARE FACTORS FREQUENCY  PT (By licensed PT), OT (By licensed OT)     PT Frequency: 2 times per week OT Frequency: 2 times per week            Contractures Contractures Info: Not present    Additional Factors Info  Code Status, Allergies Code Status Info: full code Allergies Info: Trospium, Digoxin, Iodinated Contrast Media, Streptomycin           Current Medications (07/01/2022):  This is the current hospital active medication list Current Facility-Administered Medications  Medication Dose Route Frequency Provider Last Rate Last Admin   acetaminophen (TYLENOL) tablet 650 mg  650 mg Oral Q6H PRN Mansy, Jan A, MD   650 mg at 06/30/22 0047   Or   acetaminophen (TYLENOL) suppository 650 mg  650 mg Rectal Q6H PRN Mansy, Jan A, MD       allopurinol (ZYLOPRIM) tablet 200 mg  200 mg Oral QHS Mansy, Jan A, MD   200 mg at 06/30/22 2107   amLODipine (NORVASC) tablet 10 mg  10 mg Oral Daily Mansy, Arvella Merles, MD  10 mg at 07/01/22 0945   calcium-vitamin D (OSCAL WITH D) 500-5 MG-MCG per tablet 1 tablet  1 tablet Oral BID Mansy, Jan A, MD   1 tablet at 07/01/22 0945   carbidopa-levodopa (SINEMET CR) 50-200 MG per tablet controlled release 1 tablet  1 tablet Oral BID Mansy, Jan A, MD   1 tablet at 07/01/22 0948   cholecalciferol (VITAMIN D3) 25 MCG (1000 UNIT) tablet 2,000 Units  2,000 Units Oral Daily Mansy, Jan A, MD   2,000 Units at 07/01/22 0946   cyanocobalamin (VITAMIN B12) tablet 1,000 mcg  1,000 mcg Oral Daily Mansy, Jan A, MD   1,000 mcg at 07/01/22  0945   docusate sodium (COLACE) capsule 100 mg  100 mg Oral BID Mansy, Jan A, MD   100 mg at 07/01/22 0946   enoxaparin (LOVENOX) injection 40 mg  40 mg Subcutaneous Q24H Mansy, Jan A, MD   40 mg at 06/30/22 2117   gabapentin (NEURONTIN) capsule 300 mg  300 mg Oral Daily Mansy, Jan A, MD   300 mg at 07/01/22 0946   guaiFENesin (ROBITUSSIN) 100 MG/5ML liquid 5 mL  5 mL Oral Q4H PRN Amin, Ankit Chirag, MD       hydrALAZINE (APRESOLINE) injection 10 mg  10 mg Intravenous Q4H PRN Amin, Ankit Chirag, MD       ipratropium-albuterol (DUONEB) 0.5-2.5 (3) MG/3ML nebulizer solution 3 mL  3 mL Nebulization Q4H PRN Amin, Ankit Chirag, MD       latanoprost (XALATAN) 0.005 % ophthalmic solution 1 drop  1 drop Both Eyes QPM Mansy, Jan A, MD   1 drop at 06/29/22 2200   lisinopril (ZESTRIL) tablet 2.5 mg  2.5 mg Oral Daily Mansy, Jan A, MD   2.5 mg at 07/01/22 0945   loratadine (CLARITIN) tablet 10 mg  10 mg Oral Daily Mansy, Jan A, MD   10 mg at 07/01/22 0945   magnesium hydroxide (MILK OF MAGNESIA) suspension 30 mL  30 mL Oral Daily PRN Mansy, Jan A, MD       metoprolol tartrate (LOPRESSOR) injection 5 mg  5 mg Intravenous Q4H PRN Amin, Ankit Chirag, MD       midodrine (PROAMATINE) tablet 2.5 mg  2.5 mg Oral TID WC Mansy, Jan A, MD   2.5 mg at 07/01/22 0751   mirabegron ER (MYRBETRIQ) tablet 25 mg  25 mg Oral Daily Mansy, Jan A, MD   25 mg at 07/01/22 0948   multivitamin (RENA-VIT) tablet 1 tablet  1 tablet Oral Daily Mansy, Jan A, MD   1 tablet at 07/01/22 0948   ondansetron (ZOFRAN) tablet 4 mg  4 mg Oral Q6H PRN Mansy, Jan A, MD       Or   ondansetron [facility]) injection 4 mg  4 mg Intravenous Q6H PRN Mansy, Jan A, MD       oxyCODONE (Oxy IR/ROXICODONE) immediate release tablet 5 mg  5 mg Oral Q6H PRN Mansy, Jan A, MD   5 mg at 06/30/22 2115   pantoprazole (PROTONIX) EC tablet 40 mg  40 mg Oral Daily Mansy, Jan A, MD   40 mg at 07/01/22 0946   senna-docusate (Senokot-S) tablet 1 tablet  1 tablet Oral QHS PRN  Amin, Ankit Chirag, MD       sertraline (ZOLOFT) tablet 100 mg  100 mg Oral Daily Mansy, Jan A, MD   100 mg at 07/01/22 0949   traZODone (DESYREL) tablet 50 mg  50 mg Oral QHS PRN Amin,  Jeanella Flattery, MD         Discharge Medications: Please see discharge summary for a list of discharge medications.  Relevant Imaging Results:  Relevant Lab Results:   Additional Information SS# [id number]  Conception Oms, RN

## 2022-07-05 ENCOUNTER — Emergency Department: Payer: Medicare Other

## 2022-07-05 ENCOUNTER — Other Ambulatory Visit: Payer: Self-pay

## 2022-07-05 ENCOUNTER — Encounter
Admission: EM | Disposition: A | Discharge status: Home / Self Care | Payer: Self-pay | Source: Home / Self Care | Attending: Emergency Medicine

## 2022-07-05 ENCOUNTER — Emergency Department: Payer: Medicare Other | Admitting: General Practice

## 2022-07-05 ENCOUNTER — Encounter: Payer: Self-pay | Admitting: Emergency Medicine

## 2022-07-05 ENCOUNTER — Ambulatory Visit
Admission: EM | Admit: 2022-07-05 | Discharge: 2022-07-05 | Disposition: A | Discharge status: Home / Self Care | Payer: Medicare Other | Attending: Emergency Medicine | Admitting: Emergency Medicine

## 2022-07-05 DIAGNOSIS — Z923 Personal history of irradiation: Secondary | ICD-10-CM | POA: Diagnosis not present

## 2022-07-05 DIAGNOSIS — S73005A Unspecified dislocation of left hip, initial encounter: Secondary | ICD-10-CM

## 2022-07-05 DIAGNOSIS — Y792 Prosthetic and other implants, materials and accessory orthopedic devices associated with adverse incidents: Secondary | ICD-10-CM | POA: Insufficient documentation

## 2022-07-05 DIAGNOSIS — T84021A Dislocation of internal left hip prosthesis, initial encounter: Secondary | ICD-10-CM | POA: Diagnosis not present

## 2022-07-05 DIAGNOSIS — M24452 Recurrent dislocation, left hip: Secondary | ICD-10-CM

## 2022-07-05 DIAGNOSIS — Z853 Personal history of malignant neoplasm of breast: Secondary | ICD-10-CM | POA: Insufficient documentation

## 2022-07-05 HISTORY — PX: HIP CLOSED REDUCTION: SHX983

## 2022-07-05 SURGERY — CLOSED REDUCTION, HIP
Anesthesia: General | Site: Hip | Laterality: Left

## 2022-07-05 MED ORDER — FENTANYL CITRATE (PF) 100 MCG/2ML IJ SOLN
INTRAMUSCULAR | Status: AC
  Filled 2022-07-05: qty 2

## 2022-07-05 MED ORDER — SODIUM CHLORIDE FLUSH 0.9 % IV SOLN
INTRAVENOUS | Status: AC
  Filled 2022-07-05: qty 10

## 2022-07-05 MED ORDER — PROPOFOL 10 MG/ML IV BOLUS
INTRAVENOUS | Status: AC
  Filled 2022-07-05: qty 20

## 2022-07-05 MED ORDER — FENTANYL CITRATE (PF) 100 MCG/2ML IJ SOLN
INTRAMUSCULAR | Status: DC | PRN
  Administered 2022-07-05 (×4): 25 ug via INTRAVENOUS

## 2022-07-05 MED ORDER — FENTANYL CITRATE PF 50 MCG/ML IJ SOSY
50.0000 ug | PREFILLED_SYRINGE | Freq: Once | INTRAMUSCULAR | Status: AC
  Administered 2022-07-05: 50 ug via INTRAVENOUS
  Filled 2022-07-05: qty 1

## 2022-07-05 MED ORDER — PROPOFOL 10 MG/ML IV BOLUS
INTRAVENOUS | Status: DC | PRN
  Administered 2022-07-05 (×2): 40 mg via INTRAVENOUS
  Administered 2022-07-05: 20 mg via INTRAVENOUS

## 2022-07-05 MED ORDER — LACTATED RINGERS IV SOLN: INTRAVENOUS | Status: DC | PRN

## 2022-07-05 SURGICAL SUPPLY — 2 items
IMMBOLIZER KNEE 19 BLUE UNIV (SOFTGOODS) IMPLANT
KIT TURNOVER KIT A (KITS) IMPLANT

## 2022-07-05 NOTE — ED Provider Notes (Signed)
[facility] Provider Note    Event Date/Time   First MD Initiated Contact with Patient 07/05/22 [phone]     (approximate)   History   Hip Pain   HPI  Yvonne Singleton is a 87 y.o. female with a history of hypertension, Parkinson disease, breast cancer, and recurrent hip dislocations who presents with left hip pain, acute onset this morning when the patient turned to put her underwear on.  She felt a pop in her hip similar to prior dislocations.  She denies any fall or trauma.  She denies pain other than to the left hip.    Physical Exam   Triage Vital Signs: ED Triage Vitals  Enc Vitals Group     BP 07/05/22 0839 (!) 149/60     Pulse Rate 07/05/22 0839 (!) 51     Resp 07/05/22 0839 16     Temp 07/05/22 0839 98.3 F (36.8 C)     Temp src --      SpO2 07/05/22 0839 99 %     Weight --      Height --      Head Circumference --      Peak Flow --      Pain Score 07/05/22 0840 9     Pain Loc --      Pain Edu? --      Excl. in [place]? --     Most recent vital signs: Vitals:   07/05/22 1230 07/05/22 1252  BP: (!) 190/80 (!) 168/72  Pulse: 75 (!) 57  Resp: 13 16  Temp: 98.7 F (37.1 C) (!) 97.2 F (36.2 C)  SpO2: 95% 97%     General: Awake, no distress.  CV:  Good peripheral perfusion.  Resp:  Normal effort.  Abd:  No distention.  Other:  Left hip held in flexion and internal rotation.  No deformity.  Mild tenderness to the left groin.  2+ DP pulse.  Normal cap refill.  Motor and sensory intact distally.   ED Results / Procedures / Treatments   Labs (all labs ordered are listed, but only abnormal results are displayed) Labs Reviewed - No data to display   EKG  ED ECG REPORT I, Arta Silence, the attending physician, personally viewed and interpreted this ECG.  Date: 07/05/2022 EKG Time: 0843 Rate: 57 Rhythm: Atrial fibrillation QRS Axis: Right axis Intervals: normal ST/T Wave abnormalities: normal Narrative  Interpretation: no evidence of acute ischemia; no significant change when compared to EKGs of 7/26 and 9/12    RADIOLOGY  XR L hip: I independently viewed and interpreted the images; dislocation of left total hip arthroplasty  PROCEDURES:  Critical Care performed: No  Procedures   MEDICATIONS ORDERED IN ED: Medications  sodium chloride flush 0.9 % injection (has no administration in time range)  fentaNYL (SUBLIMAZE) injection 50 mcg (50 mcg Intravenous Given 07/05/22 0858)     IMPRESSION / MDM / ASSESSMENT AND PLAN / ED COURSE  I reviewed the triage vital signs and the nursing notes.  87 year old female with PMH as noted above presents with left hip pain after she turned to put on her underwear and felt a pop similar to prior dislocations.  On exam the left lower extremity is neuro/vascular intact.  I reviewed the past medical records.  Per the hospitalist discharge summaries, the patient was most recently admitted earlier this month and previously in July with altered mental status/delirium.  Previously she was admitted in June for a right  hip dislocation, and before this in January with a left hip dislocation.  Differential diagnosis includes, but is not limited to, hip dislocation, fracture, muscle strain/spasm.  Patient's presentation is most consistent with acute presentation with potential threat to life or bodily function.  We will obtain x-rays, give analgesia, and reassess.  ----------------------------------------- 12:00 PM on 07/05/2022 -----------------------------------------  X-rays demonstrated recurrent dislocation of the left hip.  Based on review of the past medical records, when the patient was previously in the ED she was unable to tolerate moderate sedation and required reduction in the OR.  I gave the patient the option to attempt sedation in the ED and she declined.  I consulted Dr. Marry Guan from orthopedics who agreed to take the patient to the OR for  reduction this afternoon.   FINAL CLINICAL IMPRESSION(S) / ED DIAGNOSES   Final diagnoses:  Closed dislocation of left hip, initial encounter [facility])     Rx / DC Orders   ED Discharge Orders          Ordered    Call MD / Call 911       Comments: If you experience chest pain or shortness of breath, CALL 911 and be transported to the hospital emergency room.  If you develope a fever above 101 F, pus (white drainage) or increased drainage or redness at the wound, or calf pain, call your surgeon's office.   07/05/22 1152    Constipation Prevention       Comments: Drink plenty of fluids.  Prune juice may be helpful.  You may use a stool softener, such as Colace (over the counter) 100 mg twice a day.  Use MiraLax (over the counter) for constipation as needed.   07/05/22 1152    Diet general        07/05/22 1152    Increase activity slowly as tolerated        07/05/22 1152    Weight bearing as tolerated        07/05/22 1152    Discharge wound care:       Comments: Change your bandage as instructed by your health care providers.  If your bandage has been discontinued, keep your incision clean and dry.  Pat dry after bathing.  DO NOT put lotion or powder on your incision.   07/05/22 1152    Follow the hip precautions as taught in Physical Therapy       Comments: Strict posterior hip precautions. Left knee immobilizer to remain on until followed up in the office.   07/05/22 1152             Note:  This document was prepared using Dragon voice recognition software and may include unintentional dictation errors.    Arta Silence, MD 07/05/22 [phone]

## 2022-07-05 NOTE — Anesthesia Preprocedure Evaluation (Signed)
Anesthesia Evaluation  Patient identified by MRN, date of birth, ID band Patient awake    Reviewed: Allergy & Precautions, NPO status , Patient's Chart, lab work & pertinent test results  Airway Mallampati: III  TM Distance: >3 FB Neck ROM: full    Dental  (+) Dental Advidsory Given, Lower Dentures, Upper Dentures   Pulmonary neg pulmonary ROS,    Pulmonary exam normal        Cardiovascular hypertension, (-) Past MI and (-) CABG negative cardio ROS Normal cardiovascular exam     Neuro/Psych PSYCHIATRIC DISORDERS negative neurological ROS     GI/Hepatic negative GI ROS, Neg liver ROS,   Endo/Other  negative endocrine ROS  Renal/GU negative Renal ROS  negative genitourinary   Musculoskeletal   Abdominal   Peds  Hematology negative hematology ROS (+)   Anesthesia Other Findings Past Medical History: 4/74/2595: Acute metabolic encephalopathy No date: Back pain 2007: Breast cancer ([place])     Comment:  right breast lumpectomy with rad tx 2007: Cancer ([place])     Comment:  right breast No date: Collagen vascular disease ([place]) No date: Gout No date: Hard of hearing 05/16/2022: Headache No date: Hypertension No date: Parkinson's disease ([place]) 2007: Personal history of radiation therapy     Comment:  F/U right breast cancer 05/12/2022: UTI (urinary tract infection)  Past Surgical History: No date: ABDOMINAL HYSTERECTOMY 04/08/2022: ANTERIOR HIP REVISION; Right     Comment:  Procedure: Anterior hip revision cup and femoral head;                Surgeon: Hessie Knows, MD;  Location: [facility];                Service: Orthopedics;  Laterality: Right; 2007: BREAST LUMPECTOMY; Right     Comment:  suspicious calcs, f/u with radiation No date: CATARACT EXTRACTION; Bilateral 05/25/2017: ESOPHAGOGASTRODUODENOSCOPY (EGD) WITH PROPOFOL; N/A     Comment:  Procedure: ESOPHAGOGASTRODUODENOSCOPY (EGD) WITH               PROPOFOL;   Surgeon: Lollie Sails, MD;  Location:               [facility];  Service: Endoscopy;  Laterality: N/A; 05/09/2018: ESOPHAGOGASTRODUODENOSCOPY (EGD) WITH PROPOFOL; N/A     Comment:  Procedure: ESOPHAGOGASTRODUODENOSCOPY (EGD) WITH               PROPOFOL;  Surgeon: Lin Landsman, MD;  Location:               [facility];  Service: Gastroenterology;  Laterality:               N/A; 12/09/2021: ESOPHAGOGASTRODUODENOSCOPY (EGD) WITH PROPOFOL; N/A     Comment:  Procedure: ESOPHAGOGASTRODUODENOSCOPY (EGD) WITH               PROPOFOL;  Surgeon: Lin Landsman, MD;  Location:               [facility];  Service: Gastroenterology;  Laterality:               N/A; No date: EYE SURGERY; Bilateral No date: HAND SURGERY 06/24/2015: HIP CLOSED REDUCTION; Left     Comment:  Procedure: CLOSED REDUCTION HIP;  Surgeon: Dereck Leep, MD;  Location: [facility];  Service: Orthopedics;                Laterality: Left; 02/09/2016:  HIP CLOSED REDUCTION; Left     Comment:  Procedure: CLOSED MANIPULATION HIP;  Surgeon: Earnestine Leys, MD;  Location: [facility];  Service: Orthopedics;                Laterality: Left; 10/31/2021: HIP CLOSED REDUCTION; Left     Comment:  Procedure: CLOSED REDUCTION HIP;  Surgeon: Corky Mull, MD;  Location: [facility];  Service: Orthopedics;                Laterality: Left; No date: HIP SURGERY No date: JOINT REPLACEMENT     Comment:  bilateral hip No date: OOPHORECTOMY No date: TOTAL KNEE ARTHROPLASTY; Bilateral     Reproductive/Obstetrics negative OB ROS                             Anesthesia Physical Anesthesia Plan  ASA: 3  Anesthesia Plan: General   Post-op Pain Management: Minimal or no pain anticipated   Induction: Intravenous  PONV Risk Score and Plan: 3 and Propofol infusion, TIVA and Ondansetron  Airway Management Planned: Nasal Cannula  Additional Equipment:  None  Intra-op Plan:   Post-operative Plan:   Informed Consent: I have reviewed the patients History and Physical, chart, labs and discussed the procedure including the risks, benefits and alternatives for the proposed anesthesia with the patient or authorized representative who has indicated his/her understanding and acceptance.     Dental advisory given  Plan Discussed with: CRNA and Surgeon  Anesthesia Plan Comments: (Discussed risks of anesthesia with patient, including possibility of difficulty with spontaneous ventilation under anesthesia necessitating airway intervention, PONV, and rare risks such as cardiac or respiratory or neurological events, and allergic reactions. Discussed the role of CRNA in patient's perioperative care. Patient understands.)        Anesthesia Quick Evaluation

## 2022-07-05 NOTE — Anesthesia Procedure Notes (Signed)
Date/Time: 07/05/2022 11:25 AM  Performed by: Lily Peer, Carder Yin, CRNAPre-anesthesia Checklist: Patient identified, Emergency Drugs available, Suction available, Patient being monitored and Timeout performed Patient Re-evaluated:Patient Re-evaluated prior to induction Oxygen Delivery Method: Simple face mask Induction Type: IV induction

## 2022-07-05 NOTE — ED Notes (Signed)
Report given to Delia Chimes for OR

## 2022-07-05 NOTE — Discharge Instructions (Signed)
AMBULATORY SURGERY  ?DISCHARGE INSTRUCTIONS ? ? ?The drugs that you were given will stay in your system until tomorrow so for the next 24 hours you should not: ? ?Drive an automobile ?Make any legal decisions ?Drink any alcoholic beverage ? ? ?You may resume regular meals tomorrow.  Today it is better to start with liquids and gradually work up to solid foods. ? ?You may eat anything you prefer, but it is better to start with liquids, then soup and crackers, and gradually work up to solid foods. ? ? ?Please notify your doctor immediately if you have any unusual bleeding, trouble breathing, redness and pain at the surgery site, drainage, fever, or pain not relieved by medication. ? ? ? ?Additional Instructions: ? ? ? ?Please contact your physician with any problems or Same Day Surgery at [phone], Monday through Friday 6 am to 4 pm, or [place] at [place] [facility] number at [phone].  ?

## 2022-07-05 NOTE — Op Note (Signed)
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   ANESTHESIA: general  ESTIMATED BLOOD LOSS: None  FLUIDS REPLACED: 200 mL of crystalloid  INDICATIONS FOR SURGERY: Yvonne Singleton is a 87 y.o. year old female who sustained a recurrent posterior dislocation of left total hip arthroplasty. After discussion of the risks and benefits of surgical intervention, the patient expressed understanding of the risks benefits and agree with plans for closed reduction of the dislocated left total hip arthroplasty.Marland Kitchen   PROCEDURE IN DETAIL: The patient was brought in the operating room and a timeout was performed as per usual protocol.  After adequate general anesthesia, the left hip was flexed, abducted, and internally rotated.  Longitudinal traction was applied in this position until there was a palpable "clunk".  Concentric reduction of the femoral head was confirmed using the C arm.  A knee immobilizer was applied.  The patient tolerated procedure well.  She was transported to the recovery room in stable condition.  Rayvon Brandvold P. Holley Bouche M.D.

## 2022-07-05 NOTE — Anesthesia Postprocedure Evaluation (Signed)
Anesthesia Post Note  Patient: Chandria Rookstool  Procedure(s) Performed: CLOSED REDUCTION HIP (Left: Hip)  Patient location during evaluation: PACU Anesthesia Type: General Level of consciousness: awake and alert Pain management: pain level controlled Vital Signs Assessment: post-procedure vital signs reviewed and stable Respiratory status: spontaneous breathing, nonlabored ventilation, respiratory function stable and patient connected to nasal cannula oxygen Cardiovascular status: blood pressure returned to baseline and stable Postop Assessment: no apparent nausea or vomiting Anesthetic complications: no   No notable events documented.   Last Vitals:  Vitals:   07/05/22 1148 07/05/22 1200  BP: (!) 170/67 (!) 179/79  Pulse: (!) 56 (!) 52  Resp: 14 13  Temp: 36.8 C   SpO2: 100% 96%    Last Pain:  Vitals:   07/05/22 1200  TempSrc:   PainSc: 0-No pain                 Dimas Millin

## 2022-07-05 NOTE — Consult Note (Addendum)
ORTHOPAEDIC CONSULTATION  PATIENT NAME: Yvonne Singleton DOB: June 28, 1935  MRN: [id number]  REQUESTING PHYSICIAN: No att. providers found  Chief Complaint: Left hip pain  HPI: Yvonne Singleton is a 87 y.o. female who complains of left hip pain.  She was apparently putting on her underwear and felt her left hip "pop out".  She had the immediate onset of severe left hip pain.  The patient has a history of recurrent left hip dislocations.  Of note, the patient was admitted to the hospital last week for acute metabolic encephalopathy after several days of altered mental status.  She apparently improved with hydration.  Past Medical History:  Diagnosis Date   Acute metabolic encephalopathy 02/20/3975   Back pain    Breast cancer [facility]) 2007   right breast lumpectomy with rad tx   Cancer [facility]) 2007   right breast   Collagen vascular disease ([place])    Gout    Hard of hearing    Headache 05/16/2022   Hypertension    Parkinson's disease ([place])    Personal history of radiation therapy 2007   F/U right breast cancer   UTI (urinary tract infection) 05/12/2022   Past Surgical History:  Procedure Laterality Date   ABDOMINAL HYSTERECTOMY     ANTERIOR HIP REVISION Right 04/08/2022   Procedure: Anterior hip revision cup and femoral head;  Surgeon: Hessie Knows, MD;  Location: [facility];  Service: Orthopedics;  Laterality: Right;   BREAST LUMPECTOMY Right 2007   suspicious calcs, f/u with radiation   CATARACT EXTRACTION Bilateral    ESOPHAGOGASTRODUODENOSCOPY (EGD) WITH PROPOFOL N/A 05/25/2017   Procedure: ESOPHAGOGASTRODUODENOSCOPY (EGD) WITH PROPOFOL;  Surgeon: Lollie Sails, MD;  Location[facility];  Service: Endoscopy;  Laterality: N/A;   ESOPHAGOGASTRODUODENOSCOPY (EGD) WITH PROPOFOL N/A 05/09/2018   Procedure: ESOPHAGOGASTRODUODENOSCOPY (EGD) WITH PROPOFOL;  Surgeon: Lin Landsman, MD;  Location: [facility] END[facility];  Service: Gastroenterology;  Laterality: N/A;    ESOPHAGOGASTRODUODENOSCOPY (EGD) WITH PROPOFOL N/A 12/09/2021   Procedure: ESOPHAGOGASTRODUODENOSCOPY (EGD) WITH PROPOFOL;  Surgeon: Lin Landsman, MD;  Location: [place];  Service: Gastroenterology;  Laterality: N/A;   EYE SURGERY Bilateral    HAND SURGERY     HIP CLOSED REDUCTION Left 06/24/2015   Procedure: CLOSED REDUCTION HIP;  Surgeon: Dereck Leep, MD;  Location: [facility];  Service: Orthopedics;  Laterality: Left;   HIP CLOSED REDUCTION Left 02/09/2016   Procedure: CLOSED MANIPULATION HIP;  Surgeon: Earnestine Leys, MD;  Location: [facility];  Service: Orthopedics;  Laterality: Left;   HIP CLOSED REDUCTION Left 10/31/2021   Procedure: CLOSED REDUCTION HIP;  Surgeon: Corky Mull, MD;  Location: [facility];  Service: Orthopedics;  Laterality: Left;   HIP SURGERY     JOINT REPLACEMENT     bilateral hip   OOPHORECTOMY     TOTAL KNEE ARTHROPLASTY Bilateral    Social History   Socioeconomic History   Marital status: Widowed    Spouse name: Not on file   Number of children: Not on file   Years of education: Not on file   Highest education level: Not on file  Occupational History   Not on file  Tobacco Use   Smoking status: Never   Smokeless tobacco: Never  Substance and Sexual Activity   Alcohol use: No    Alcohol/week: 0.0 standard drinks of alcohol   Drug use: No   Sexual activity: Not on file  Other Topics Concern   Not on file  Social History Narrative   Not  on file   Social Determinants of Health   Financial Resource Strain: Not on file  Food Insecurity: No Food Insecurity (06/29/2022)   Hunger Vital Sign    Worried About Running Out of Food in the Last Year: Never true    Ran Out of Food in the Last Year: Never true  Transportation Needs: No Transportation Needs (06/29/2022)   PRAPARE - Hydrologist (Medical): No    Lack of Transportation (Non-Medical): No  Physical Activity: Not on file  Stress: Not on file  Social  Connections: Not on file   Family History  Problem Relation Age of Onset   Heart disease Mother    Arthritis Mother    Heart disease Father    Ulcers Father    Breast cancer Maternal Aunt    Allergies  Allergen Reactions   Trospium Nausea And Vomiting   Digoxin     Other reaction(s): Unknown Note: pt had lethargy, felt bad Note: pt had lethargy, felt bad  Other reaction(s): Unknown Note: pt had lethargy, felt bad  Other reaction(s): Unknown Note: pt had lethargy, felt bad Note: pt had lethargy, felt bad   Iodinated Contrast Media Hives   Streptomycin Hives   Prior to Admission medications   Medication Sig Start Date End Date Taking? Authorizing Provider  acetaminophen (TYLENOL) 500 MG tablet Take 2 tablets (1,000 mg total) by mouth every 8 (eight) hours. 05/17/22  Yes Nicole Kindred A, DO  amLODipine (NORVASC) 10 MG tablet Take 1 tablet (10 mg total) by mouth daily. 05/18/22  Yes Nicole Kindred A, DO  b complex vitamins tablet Take 1 tablet by mouth daily.   Yes [provider]  cetirizine (ZYRTEC) 5 MG tablet 5 mg DAILY (route: oral) 05/26/22  Yes [provider]  clotrimazole (LOTRIMIN) 1 % cream Apply topically 2 (two) times daily. 04/12/22  Yes Nolberto Hanlon, MD  docusate sodium (COLACE) 100 MG capsule Take 1 capsule (100 mg total) by mouth 2 (two) times daily. 04/09/22  Yes Duanne Guess, PA-C  gabapentin (NEURONTIN) 300 MG capsule Take 1 capsule (300 mg total) by mouth 2 (two) times daily. Patient taking differently: Take 300 mg by mouth daily. 04/12/22  Yes Nolberto Hanlon, MD  latanoprost (XALATAN) 0.005 % ophthalmic solution Place 1 drop into both eyes every evening. 02/19/22  Yes [provider]  lisinopril (ZESTRIL) 2.5 MG tablet Take 2.5 mg by mouth daily. 06/28/22  Yes [provider]  MYRBETRIQ 25 MG TB24 tablet Take 25 mg by mouth daily. 10/08/21  Yes [provider]  oxyCODONE (OXY IR/ROXICODONE) 5 MG immediate release tablet  Take 1 tablet (5 mg total) by mouth every 6 (six) hours as needed for severe pain or moderate pain. 07/01/22  Yes Amin, Ankit Chirag, MD  pantoprazole (PROTONIX) 40 MG tablet Take 1 tablet (40 mg total) by mouth 2 (two) times daily before a meal. Patient taking differently: Take 40 mg by mouth 2 (two) times daily. 12/09/21 07/05/22 Yes Vanga, Tally Due, MD  Polyethyl Glycol-Propyl Glycol (SYSTANE) 0.4-0.3 % SOLN Place 1 drop into both eyes daily. 05/26/22  Yes [provider]  polyethylene glycol (MIRALAX / GLYCOLAX) 17 g packet Take 17 g by mouth 2 (two) times daily.   Yes [provider]  sertraline (ZOLOFT) 100 MG tablet Take 100 mg by mouth daily. 09/30/21  Yes [provider]  allopurinol (ZYLOPRIM) 100 MG tablet Take 200 mg by mouth at bedtime.  [provider]  Calcium Carbonate-Vitamin D 600-200 MG-UNIT TABS Take 1 tablet by mouth 2 (two) times daily.    [provider]  carbidopa-levodopa (SINEMET CR) 50-200 MG per tablet Take 1 tablet by mouth 2 (two) times daily.    [provider]  cholecalciferol (VITAMIN D) 1000 units tablet Take 2,000 Units by mouth daily.    [provider]  cyanocobalamin 1000 MCG tablet Take 1 tablet (1,000 mcg total) by mouth daily. 05/18/22   Ezekiel Slocumb, DO  feeding supplement (ENSURE ENLIVE / ENSURE PLUS) LIQD Take 237 mLs by mouth 2 (two) times daily between meals. Patient not taking: Reported on 06/29/2022 04/12/22   Nolberto Hanlon, MD  lidocaine (ASPERCREME LIDOCAINE) 4 % Place 1 patch onto the skin every 12 (twelve) hours. Patient not taking: Reported on 06/29/2022    [provider]  midodrine (PROAMATINE) 2.5 MG tablet Take 1 tablet (2.5 mg total) by mouth 3 (three) times daily with meals. HOLD IF SYSTOLIC BP > 341 Patient not taking: Reported on 06/29/2022 05/17/22   Nicole Kindred A, DO  senna-docusate (SENOKOT-S) 8.6-50 MG tablet Take 1 tablet by mouth at bedtime as needed for mild  constipation. 04/12/22   Nolberto Hanlon, MD  tolterodine (DETROL LA) 2 MG 24 hr capsule Take 2 mg by mouth daily.    [provider]   DG Hip Unilat W or Wo Pelvis 2-3 Views Left  Result Date: 07/05/2022 CLINICAL DATA:  Left hip pain EXAM: DG HIP (WITH OR WITHOUT PELVIS) 2-3V LEFT COMPARISON:  None Available. FINDINGS: No acute fracture. Left hip arthroplasty. Superior dislocation of the left femoral head component relative to the acetabular component. Generalized osteopenia. No aggressive osseous lesion. Normal alignment. Soft tissue are unremarkable. No radiopaque foreign body or soft tissue emphysema. IMPRESSION: 1. Left hip arthroplasty with superior dislocation of the left femoral head component relative to the acetabular component. Electronically Signed   By: Kathreen Devoid M.D.   On: 07/05/2022 09:26    Positive ROS: All other systems have been reviewed and were otherwise negative with the exception of those mentioned in the HPI and as above.  Physical Exam: General: Well developed, well nourished female seen in obvious discomfort HEENT: Atraumatic and normocephalic. Sclera are clear. Extraocular motion is intact. Oropharynx is clear with moist mucosa. Neck: Supple, nontender, good range of motion. No JVD or carotid bruits. Lungs: Clear to auscultation bilaterally. Cardiovascular: Regular rate and rhythm with normal S1 and S2. No murmurs. No gallops or rubs. Pedal pulses are palpable bilaterally. Homans test is negative bilaterally. No significant pretibial or ankle edema. Abdomen: Soft, nontender, and nondistended. Bowel sounds are present. Skin: No lesions in the area of chief complaint Neurologic: Awake, alert, and oriented. Sensory function is grossly intact. Motor strength is felt to be 5 over 5 bilaterally. No clonus, Positive tremor. Good motor coordination. Lymphatic: No axillary or cervical lymphadenopathy  MUSCULOSKELETAL: The left lower extremity is shortened and rotated.   Pain is elicited with any attempted range of motion of the left hip.  Assessment: Recurrent dislocation of left total hip arthroplasty  Plan: The findings were discussed in detail with the patient. The usual perioperative course was discussed. The risks and benefits of surgical intervention were reviewed. The patient expressed understanding of the risks and benefits and agreed with plans for his reduction of the dislocated left total hip arthroplasty.  Yael Coppess P. Holley Bouche M.D.

## 2022-07-05 NOTE — ED Triage Notes (Signed)
Pt to ED via [facility] from [facility] for left hip pain. Pt has hx/o hip dislocation. Pt was putting her underwear on and felt her hip pop. Pt denies any falls. Pt is in NAD.

## 2022-07-05 NOTE — Transfer of Care (Signed)
Immediate Anesthesia Transfer of Care Note  Patient: Yvonne Singleton  Procedure(s) Performed: CLOSED REDUCTION HIP (Left: Hip)  Patient Location: PACU  Anesthesia Type:General  Level of Consciousness: awake, alert  and oriented  Airway & Oxygen Therapy: Patient Spontanous Breathing and Patient connected to face mask oxygen  Post-op Assessment: Report given to RN and Post -op Vital signs reviewed and stable  Post vital signs: Reviewed and stable  Last Vitals:  Vitals Value Taken Time  BP 170/67 07/05/22 1145  Temp    Pulse 56 07/05/22 1147  Resp 14 07/05/22 1146  SpO2 100 % 07/05/22 1147  Vitals shown include unvalidated device data.  Last Pain:  Vitals:   07/05/22 1100  TempSrc: Temporal  PainSc: 7          Complications: No notable events documented.

## 2022-07-06 ENCOUNTER — Encounter: Payer: Self-pay | Admitting: Orthopedic Surgery

## 2022-08-04 ENCOUNTER — Other Ambulatory Visit: Payer: Self-pay

## 2022-08-04 ENCOUNTER — Emergency Department
Admission: EM | Admit: 2022-08-04 | Discharge: 2022-08-04 | Disposition: A | Discharge status: Home / Self Care | Payer: Medicare Other | Attending: Emergency Medicine | Admitting: Emergency Medicine

## 2022-08-04 ENCOUNTER — Emergency Department: Payer: Medicare Other

## 2022-08-04 DIAGNOSIS — M79605 Pain in left leg: Secondary | ICD-10-CM | POA: Insufficient documentation

## 2022-08-04 MED ORDER — OXYCODONE HCL 5 MG PO TABS
5.0000 mg | ORAL_TABLET | Freq: Once | ORAL | Status: AC
  Administered 2022-08-04: 5 mg via ORAL
  Filled 2022-08-04: qty 1

## 2022-08-04 NOTE — Discharge Instructions (Signed)
Your exam and XRs are normal. There is no evidence of bony fracture or hardware dislocation. Take your prescribed pain medicine as needed. Follow-up with Ortho as needed.

## 2022-08-04 NOTE — ED Triage Notes (Signed)
Pt comes with c/o left leg pain. Pt states painful with movement and standing.   Pt has brace in place from when hip came out about month ago.

## 2022-08-04 NOTE — ED Provider Triage Note (Signed)
Emergency Medicine Provider Triage Evaluation Note  Yvonne Singleton , a 87 y.o. female  was evaluated in triage.  Pt complains of left hip pain, pain at the knee for the brace for her knee immobilizer is.  Patient has been wearing the knee immobilizer for about a month.  History of left hip dislocation with closed reduction..  Review of Systems  Positive:  Negative:   Physical Exam  There were no vitals taken for this visit. Gen:   Awake, no distress   Resp:  Normal effort  MSK:   Patient in the immobilizer, tender along the following Other:    Medical Decision Making  Medically screening exam initiated at 5:09 PM.  Appropriate orders placed.  Yvonne Singleton was informed that the remainder of the evaluation will be completed by another provider, this initial triage assessment does not replace that evaluation, and the importance of remaining in the ED until their evaluation is complete.     Yvonne Starks, PA-C 08/04/22 1710

## 2022-08-04 NOTE — ED Provider Notes (Signed)
[facility] Emergency Department Provider Note     Event Date/Time   First MD Initiated Contact with Patient 08/04/22 2017     (approximate)   History   Leg Pain   HPI  Yvonne Singleton is a 87 y.o. female presents to the ED from her facility covered by her son, she apparently called her son, reporting significant pain to her left leg.  Patient is in a knee immobilizer and receiving physical therapy.  She had a recent dislocation of her hardware, but according to her MAR, is on daily pain medicine.  She denies any recent injury, trauma, or fall.   Physical Exam   Triage Vital Signs: ED Triage Vitals  Enc Vitals Group     BP 08/04/22 1710 118/66     Pulse Rate 08/04/22 1710 68     Resp 08/04/22 1710 18     Temp 08/04/22 1710 98 F (36.7 C)     Temp src --      SpO2 08/04/22 1710 98 %     Weight --      Height --      Head Circumference --      Peak Flow --      Pain Score 08/04/22 1706 6     Pain Loc --      Pain Edu? --      Excl. in [place]? --     Most recent vital signs: Vitals:   08/04/22 1710 08/04/22 2043  BP: 118/66 114/80  Pulse: 68 70  Resp: 18 18  Temp: 98 F (36.7 C) 98.1 F (36.7 C)  SpO2: 98% 96%    General Awake, no distress. NAD CV:  Good peripheral perfusion.  RESP:  Normal effort.  ABD:  No distention.  MSK:  Left leg with knee immobilizer in place.  Patient with normal distal range of motion normal gross sensation.   ED Results / Procedures / Treatments   Labs (all labs ordered are listed, but only abnormal results are displayed) Labs Reviewed - No data to display   EKG   RADIOLOGY  I personally viewed and evaluated these images as part of my medical decision making, as well as reviewing the written report by the radiologist.  ED Provider Interpretation: no acute findings  No results found.   PROCEDURES:  Critical Care performed: No  Procedures   MEDICATIONS ORDERED IN  ED: Medications  oxyCODONE (Oxy IR/ROXICODONE) immediate release tablet 5 mg (5 mg Oral Given 08/04/22 2043)     IMPRESSION / MDM / ASSESSMENT AND PLAN / ED COURSE  I reviewed the triage vital signs and the nursing notes.                              Differential diagnosis includes, but is not limited to, periprosthetic fracture of the left knee, acute hip fracture, acute hip dislocation,  Patient's presentation is most consistent with acute complicated illness / injury requiring diagnostic workup.  Geriatric patient to the ED for evaluation of of acute pain to her left leg.  Patient reports the leg is painful with movement, localizing pain to the anterior upper thigh.  She is in a knee immobilizer with nonweightbearing status secondary to her recent operative dislocation.  Patient presents to the ED from her facility without reports of recent fall.  She is evaluated for complaints in ED, with imaging of the hips and the knee  joint.  My review of his images does not reveal any acute fracture or dislocation.  This concurs with the radiology report.  Patient's diagnosis is consistent with leg pain on the left.. Patient will be discharged home with actions to take her prescribed pain medicines.. Patient is to follow up with her primary provider or orthopedic provider as needed or otherwise directed. Patient is given ED precautions to return to the ED for any worsening or new symptoms.     FINAL CLINICAL IMPRESSION(S) / ED DIAGNOSES   Final diagnoses:  Left leg pain     Rx / DC Orders   ED Discharge Orders     None        Note:  This document was prepared using Dragon voice recognition software and may include unintentional dictation errors.    Melvenia Needles, PA-C 08/06/22 2232    Nathaniel Man, MD 08/06/22 2252

## 2022-09-14 ENCOUNTER — Other Ambulatory Visit: Payer: Self-pay | Admitting: Internal Medicine

## 2022-09-14 DIAGNOSIS — Z1231 Encounter for screening mammogram for malignant neoplasm of breast: Secondary | ICD-10-CM

## 2022-10-27 ENCOUNTER — Ambulatory Visit
Admission: RE | Admit: 2022-10-27 | Discharge: 2022-10-27 | Disposition: A | Discharge status: Home / Self Care | Payer: Medicare Other | Source: Ambulatory Visit | Attending: Internal Medicine | Admitting: Internal Medicine

## 2022-10-27 DIAGNOSIS — Z1231 Encounter for screening mammogram for malignant neoplasm of breast: Secondary | ICD-10-CM | POA: Insufficient documentation

## 2022-12-06 ENCOUNTER — Ambulatory Visit (INDEPENDENT_AMBULATORY_CARE_PROVIDER_SITE_OTHER): Payer: Medicare Other | Admitting: Urology

## 2022-12-06 ENCOUNTER — Encounter: Payer: Self-pay | Admitting: Urology

## 2022-12-06 VITALS — BP 124/71 | HR 69 | Ht 63.5 in | Wt 167.0 lb

## 2022-12-06 DIAGNOSIS — N3946 Mixed incontinence: Secondary | ICD-10-CM | POA: Diagnosis not present

## 2022-12-06 LAB — MICROSCOPIC EXAMINATION: WBC, UA: >30 /hpf — AB (ref 0–5)

## 2022-12-06 LAB — URINALYSIS, COMPLETE
Bilirubin, UA: NEGATIVE
Glucose, UA: NEGATIVE
Ketones, UA: NEGATIVE
Nitrite, UA: NEGATIVE
Protein,UA: NEGATIVE
Specific Gravity, UA: 1.01 (ref 1.005–1.030)
Urobilinogen, Ur: 0.2 mg/dL (ref 0.2–1.0)
pH, UA: 5.5 (ref 5.0–7.5)

## 2022-12-06 MED ORDER — GEMTESA 75 MG PO TABS: 1.0000 | ORAL_TABLET | Freq: Every day | ORAL | 11 refills | Status: DC

## 2022-12-06 NOTE — Progress Notes (Signed)
12/06/2022 2:09 PM   Yvonne Singleton [birth year] [id number]  Referring provider: Perrin Maltese, MD [address] [place],   [address]  No chief complaint on file.   HPI: I was consulted to assess the patient's urinary incontinence.  Main symptom is urge incontinence but she also has moderately severe stress incontinence.  She has mild bedwetting.  She can soak 4-5 pads a day  She voids every 2-3 hours gets up once a night.  She has Parkinson's and is currently not responding to a combination of Detrol and Myrbetriq  She has had a hysterectomy low back surgery.  She uses a chair but can transfer to the toilet  No bladder surgery   PMH: Past Medical History:  Diagnosis Date   Acute metabolic encephalopathy [id number]   Back pain    Breast cancer [facility]) 2007   right breast lumpectomy with rad tx   Cancer [facility]) 2007   right breast   Collagen vascular disease ([place])    Gout    Hard of hearing    Headache 05/16/2022   Hypertension    Parkinson's disease    Personal history of radiation therapy 2007   F/U right breast cancer   UTI (urinary tract infection) 05/12/2022    Surgical History: Past Surgical History:  Procedure Laterality Date   ABDOMINAL HYSTERECTOMY     ANTERIOR HIP REVISION Right 04/08/2022   Procedure: Anterior hip revision cup and femoral head;  Surgeon: Hessie Knows, MD;  Location: [facility] ORS;  Service: Orthopedics;  Laterality: Right;   BREAST LUMPECTOMY Right 2007   suspicious calcs, f/u with radiation   CATARACT EXTRACTION Bilateral    ESOPHAGOGASTRODUODENOSCOPY (EGD) WITH PROPOFOL N/A 05/25/2017   Procedure: ESOPHAGOGASTRODUODENOSCOPY (EGD) WITH PROPOFOL;  Surgeon: Lollie Sails, MD;  Location: [facility] ENDOSCOPY;  Service: Endoscopy;  Laterality: N/A;   ESOPHAGOGASTRODUODENOSCOPY (EGD) WITH PROPOFOL N/A 05/09/2018   Procedure: ESOPHAGOGASTRODUODENOSCOPY (EGD) WITH PROPOFOL;  Surgeon: Lin Landsman, MD;  Location: [facility] ENDOSCOPY;   Service: Gastroenterology;  Laterality: N/A;   ESOPHAGOGASTRODUODENOSCOPY (EGD) WITH PROPOFOL N/A 12/09/2021   Procedure: ESOPHAGOGASTRODUODENOSCOPY (EGD) WITH PROPOFOL;  Surgeon: Lin Landsman, MD;  Location: [facility];  Service: Gastroenterology;  Laterality: N/A;   EYE SURGERY Bilateral    HAND SURGERY     HIP CLOSED REDUCTION Left 06/24/2015   Procedure: CLOSED REDUCTION HIP;  Surgeon: Dereck Leep, MD;  Location: [facility] ORS;  Service: Orthopedics;  Laterality: Left;   HIP CLOSED REDUCTION Left 02/09/2016   Procedure: CLOSED MANIPULATION HIP;  Surgeon: Earnestine Leys, MD;  Location: [facility] ORS;  Service: Orthopedics;  Laterality: Left;   HIP CLOSED REDUCTION Left 10/31/2021   Procedure: CLOSED REDUCTION HIP;  Surgeon: Corky Mull, MD;  Location: [facility] ORS;  Service: Orthopedics;  Laterality: Left;   HIP CLOSED REDUCTION Left 07/05/2022   Procedure: CLOSED REDUCTION HIP;  Surgeon: Dereck Leep, MD;  Location: [facility] ORS;  Service: Orthopedics;  Laterality: Left;   HIP SURGERY     JOINT REPLACEMENT     bilateral hip   OOPHORECTOMY     TOTAL KNEE ARTHROPLASTY Bilateral     Home Medications:  Allergies as of 12/06/2022       Reactions   Streptomycin Hives   Digoxin Other (See Comments)   Other reaction(s): Unknown Note: pt had lethargy, felt bad Note: pt had lethargy, felt bad Other reaction(s): Unknown Note: pt had lethargy, felt bad Other reaction(s): Unknown Note: pt had lethargy, felt bad Note: pt had lethargy, felt  bad   Trospium Nausea And Vomiting   Iodinated Contrast Media Hives        Medication List        Accurate as of December 06, 2022  2:09 PM. If you have any questions, ask your nurse or doctor.          acetaminophen 500 MG tablet Commonly known as: TYLENOL Take 2 tablets (1,000 mg total) by mouth every 8 (eight) hours.   allopurinol 100 MG tablet Commonly known as: ZYLOPRIM Take 200 mg by mouth at bedtime.   amLODipine 10 MG tablet Commonly  known as: NORVASC Take 1 tablet (10 mg total) by mouth daily.   Aspercreme Lidocaine 4 % Generic drug: lidocaine Place 1 patch onto the skin every 12 (twelve) hours.   b complex vitamins tablet Take 1 tablet by mouth daily.   Calcium Carbonate-Vitamin D 600-200 MG-UNIT Tabs Take 1 tablet by mouth 2 (two) times daily.   carbidopa-levodopa 50-200 MG tablet Commonly known as: SINEMET CR Take 1 tablet by mouth 2 (two) times daily.   cetirizine 5 MG tablet Commonly known as: ZYRTEC 5 mg DAILY (route: oral)   cholecalciferol 1000 units tablet Commonly known as: VITAMIN D Take 1,000 Units by mouth daily.   clotrimazole 1 % cream Commonly known as: LOTRIMIN Apply topically 2 (two) times daily.   cyanocobalamin 1000 MCG tablet Take 1 tablet (1,000 mcg total) by mouth daily.   docusate sodium 100 MG capsule Commonly known as: COLACE Take 1 capsule (100 mg total) by mouth 2 (two) times daily.   feeding supplement Liqd Take 237 mLs by mouth 2 (two) times daily between meals.   gabapentin 300 MG capsule Commonly known as: NEURONTIN Take 1 capsule (300 mg total) by mouth 2 (two) times daily. What changed: when to take this   latanoprost 0.005 % ophthalmic solution Commonly known as: XALATAN Place 1 drop into both eyes every evening.   lisinopril 2.5 MG tablet Commonly known as: ZESTRIL Take 2.5 mg by mouth daily.   midodrine 2.5 MG tablet Commonly known as: PROAMATINE Take 1 tablet (2.5 mg total) by mouth 3 (three) times daily with meals. HOLD IF SYSTOLIC BP > 123456   Myrbetriq 25 MG Tb24 tablet Generic drug: mirabegron ER Take 25 mg by mouth daily.   oxyCODONE 5 MG immediate release tablet Commonly known as: Oxy IR/ROXICODONE Take 1 tablet (5 mg total) by mouth every 6 (six) hours as needed for severe pain or moderate pain.   pantoprazole 40 MG tablet Commonly known as: PROTONIX Take 1 tablet (40 mg total) by mouth 2 (two) times daily before a meal. What changed:  when to take this   polyethylene glycol 17 g packet Commonly known as: MIRALAX / GLYCOLAX Take 17 g by mouth 2 (two) times daily.   senna-docusate 8.6-50 MG tablet Commonly known as: Senokot-S Take 1 tablet by mouth at bedtime as needed for mild constipation.   sertraline 100 MG tablet Commonly known as: ZOLOFT Take 100 mg by mouth daily.   Systane 0.4-0.3 % Soln Generic drug: Polyethyl Glycol-Propyl Glycol Place 1 drop into both eyes daily.   tolterodine 2 MG 24 hr capsule Commonly known as: DETROL LA Take 2 mg by mouth daily.        Allergies:  Allergies  Allergen Reactions   Streptomycin Hives   Digoxin Other (See Comments)    Other reaction(s): Unknown Note: pt had lethargy, felt bad Note: pt had lethargy, felt bad  Other reaction(s):  Unknown Note: pt had lethargy, felt bad  Other reaction(s): Unknown Note: pt had lethargy, felt bad Note: pt had lethargy, felt bad   Trospium Nausea And Vomiting   Iodinated Contrast Media Hives    Family History: Family History  Problem Relation Age of Onset   Heart disease Mother    Arthritis Mother    Heart disease Father    Ulcers Father    Breast cancer Maternal Aunt     Social History:  reports that she has never smoked. She has never used smokeless tobacco. She reports that she does not drink alcohol and does not use drugs.  ROS:                                        Physical Exam: There were no vitals taken for this visit.  Constitutional:  Alert and oriented, No acute distress. HEENT: [place] AT, moist mucus membranes.  Trachea midline, no masses.  Laboratory Data: Lab Results  Component Value Date   WBC 6.8 07/01/2022   HGB 10.4 (L) 07/01/2022   HCT 31.6 (L) 07/01/2022   MCV 94.9 07/01/2022   PLT 169 07/01/2022    Lab Results  Component Value Date   CREATININE 0.72 07/01/2022    No results found for: "PSA"  No results found for: "TESTOSTERONE"  No results found for:  "HGBA1C"  Urinalysis    Component Value Date/Time   COLORURINE STRAW (A) 06/29/2022 1941   APPEARANCEUR CLEAR (A) 06/29/2022 1941   APPEARANCEUR Clear 09/30/2014 1508   LABSPEC 1.005 06/29/2022 1941   LABSPEC 1.006 09/30/2014 1508   PHURINE 5.0 06/29/2022 1941   GLUCOSEU NEGATIVE 06/29/2022 1941   GLUCOSEU Negative 09/30/2014 1508   HGBUR MODERATE (A) 06/29/2022 1941   BILIRUBINUR NEGATIVE 06/29/2022 1941   BILIRUBINUR Negative 09/30/2014 1508   [place] 06/29/2022 1941   PROTEINUR 100 (A) 06/29/2022 1941   NITRITE NEGATIVE 06/29/2022 1941   LEUKOCYTESUR NEGATIVE 06/29/2022 1941   LEUKOCYTESUR 2+ 09/30/2014 1508    Pertinent Imaging: Urine reviewed.  Urine sent for culture.  Chart reviewed  Assessment & Plan: Return in 6 weeks for pelvic examination cystoscopy and postvoid residual on Gemtesa samples.  Stop Detrol and Myrbetriq.  Call if culture positive  There are no diagnoses linked to this encounter.  No follow-ups on file.  Reece Packer, MD  [place] [facility] [address] [place], [place] [address] [phone]

## 2022-12-08 ENCOUNTER — Telehealth: Payer: Self-pay | Admitting: Family Medicine

## 2022-12-08 NOTE — Telephone Encounter (Signed)
Dottie from [facility] [phone] called and states patient's insurance will not cover the [facility]. She is wanting to know if you want her to try something else or stay on the Myrbetriq?

## 2022-12-13 MED ORDER — OXYBUTYNIN CHLORIDE ER 10 MG PO TB24: 10.0000 mg | ORAL_TABLET | Freq: Every day | ORAL | 11 refills | Status: DC

## 2022-12-13 NOTE — Telephone Encounter (Signed)
Yvonne Loser, MD sent to [name], CMA Caller: Unspecified (5 days ago,  4:52 PM) Oxybutynin ER 10 mg 30 x 11  Patient notified and will pick up medication from pharmacy.

## 2022-12-13 NOTE — Addendum Note (Signed)
Addended by: [name] on: 12/13/2022 01:33 PM   Modules accepted: Orders

## 2022-12-13 NOTE — Telephone Encounter (Signed)
Dottie from [facility] called stating they need D/C order for Gemtesa to be faxed to them at [phone] since patient is not taking this due to insurance coverage.

## 2022-12-24 ENCOUNTER — Other Ambulatory Visit: Payer: Self-pay

## 2023-01-03 ENCOUNTER — Telehealth: Payer: Self-pay | Admitting: Urology

## 2023-01-03 MED ORDER — OXYBUTYNIN CHLORIDE ER 10 MG PO TB24: 10.0000 mg | ORAL_TABLET | Freq: Every day | ORAL | 11 refills | Status: DC

## 2023-01-03 NOTE — Telephone Encounter (Signed)
Spoke with dottie and sent rx to [facility].

## 2023-01-03 NOTE — Telephone Encounter (Signed)
Pt called to let us know her pharmacy that [facility] uses is [facility] [phone] and she will need her Oxybutynin sent to [facility].

## 2023-01-17 ENCOUNTER — Other Ambulatory Visit: Payer: Medicare Other | Admitting: Urology

## 2023-01-17 ENCOUNTER — Emergency Department: Payer: Medicare Other | Admitting: Anesthesiology

## 2023-01-17 ENCOUNTER — Encounter: Payer: Self-pay | Admitting: Anesthesiology

## 2023-01-17 ENCOUNTER — Emergency Department: Payer: Medicare Other

## 2023-01-17 ENCOUNTER — Ambulatory Visit
Admission: EM | Admit: 2023-01-17 | Discharge: 2023-01-17 | Disposition: A | Discharge status: Home / Self Care | Payer: Medicare Other | Attending: Emergency Medicine | Admitting: Emergency Medicine

## 2023-01-17 ENCOUNTER — Encounter
Admission: EM | Disposition: A | Discharge status: Home / Self Care | Payer: Self-pay | Source: Home / Self Care | Attending: Emergency Medicine

## 2023-01-17 DIAGNOSIS — Z08 Encounter for follow-up examination after completed treatment for malignant neoplasm: Secondary | ICD-10-CM | POA: Insufficient documentation

## 2023-01-17 DIAGNOSIS — Y792 Prosthetic and other implants, materials and accessory orthopedic devices associated with adverse incidents: Secondary | ICD-10-CM | POA: Insufficient documentation

## 2023-01-17 DIAGNOSIS — Z853 Personal history of malignant neoplasm of breast: Secondary | ICD-10-CM | POA: Diagnosis not present

## 2023-01-17 DIAGNOSIS — S73035A Other anterior dislocation of left hip, initial encounter: Secondary | ICD-10-CM

## 2023-01-17 DIAGNOSIS — T84021A Dislocation of internal left hip prosthesis, initial encounter: Secondary | ICD-10-CM | POA: Diagnosis not present

## 2023-01-17 HISTORY — PX: HIP CLOSED REDUCTION: SHX983

## 2023-01-17 LAB — CBC WITH DIFFERENTIAL/PLATELET
Abs Immature Granulocytes: 0.04 10*3/uL (ref 0.00–0.07)
Basophils Absolute: 0 10*3/uL (ref 0.0–0.1)
Basophils Relative: 1 %
Eosinophils Absolute: 0.3 10*3/uL (ref 0.0–0.5)
Eosinophils Relative: 4 %
HCT: 41.3 % (ref 36.0–46.0)
Hemoglobin: 13.1 g/dL (ref 12.0–15.0)
Immature Granulocytes: 1 %
Lymphocytes Relative: 31 %
Lymphs Abs: 2.5 10*3/uL (ref 0.7–4.0)
MCH: 31.5 pg (ref 26.0–34.0)
MCHC: 31.7 g/dL (ref 30.0–36.0)
MCV: 99.3 fL (ref 80.0–100.0)
Monocytes Absolute: 0.6 10*3/uL (ref 0.1–1.0)
Monocytes Relative: 7 %
Neutro Abs: 4.8 10*3/uL (ref 1.7–7.7)
Neutrophils Relative %: 56 %
Platelets: 261 10*3/uL (ref 150–400)
RBC: 4.16 MIL/uL (ref 3.87–5.11)
RDW: 13 % (ref 11.5–15.5)
WBC: 8.2 10*3/uL (ref 4.0–10.5)
nRBC: 0 % (ref 0.0–0.2)

## 2023-01-17 LAB — BASIC METABOLIC PANEL
Anion gap: 10 (ref 5–15)
BUN: 53 mg/dL — ABNORMAL HIGH (ref 8–23)
CO2: 26 mmol/L (ref 22–32)
Calcium: 9.2 mg/dL (ref 8.9–10.3)
Chloride: 105 mmol/L (ref 98–111)
Creatinine, Ser: 1.17 mg/dL — ABNORMAL HIGH (ref 0.44–1.00)
GFR, Estimated: 46 mL/min — ABNORMAL LOW (ref >60)
Glucose, Bld: 96 mg/dL (ref 70–99)
Potassium: 4.8 mmol/L (ref 3.5–5.1)
Sodium: 141 mmol/L (ref 135–145)

## 2023-01-17 SURGERY — CLOSED REDUCTION, HIP
Anesthesia: General | Site: Hip | Laterality: Left

## 2023-01-17 MED ORDER — SODIUM CHLORIDE 0.9 % IV BOLUS
500.0000 mL | Freq: Once | INTRAVENOUS | Status: AC
  Administered 2023-01-17: 500 mL via INTRAVENOUS

## 2023-01-17 MED ORDER — FENTANYL CITRATE (PF) 100 MCG/2ML IJ SOLN
INTRAMUSCULAR | Status: DC | PRN
  Administered 2023-01-17 (×2): 25 ug via INTRAVENOUS

## 2023-01-17 MED ORDER — PROPOFOL 10 MG/ML IV BOLUS
INTRAVENOUS | Status: AC
  Filled 2023-01-17: qty 20

## 2023-01-17 MED ORDER — ONDANSETRON HCL 4 MG/2ML IJ SOLN
4.0000 mg | Freq: Once | INTRAMUSCULAR | Status: AC
  Administered 2023-01-17: 4 mg via INTRAVENOUS
  Filled 2023-01-17: qty 2

## 2023-01-17 MED ORDER — PROPOFOL 10 MG/ML IV BOLUS
INTRAVENOUS | Status: DC | PRN
  Administered 2023-01-17 (×2): 50 mg via INTRAVENOUS

## 2023-01-17 MED ORDER — FENTANYL CITRATE PF 50 MCG/ML IJ SOSY
50.0000 ug | PREFILLED_SYRINGE | Freq: Once | INTRAMUSCULAR | Status: AC
  Administered 2023-01-17: 50 ug via INTRAVENOUS
  Filled 2023-01-17: qty 1

## 2023-01-17 MED ORDER — SODIUM CHLORIDE 0.9 % IV SOLN: INTRAVENOUS | Status: DC | PRN

## 2023-01-17 MED ORDER — FENTANYL CITRATE (PF) 100 MCG/2ML IJ SOLN
INTRAMUSCULAR | Status: AC
  Filled 2023-01-17: qty 2

## 2023-01-17 NOTE — Discharge Instructions (Addendum)
Continue with Knee immobilizer on left lower extremity for the next 6 weeks.  Follow up with [facility] orthopedics in 2 weeks for recheck.     Closed Reduction for Prosthetic Hip Joint Dislocation, Care After This sheet gives you information about how to care for yourself after your procedure. Your health care provider may also give you more specific instructions. If you have problems or questions, contact your health care provider. What can I expect after the procedure? After the procedure, it is common to have: Hip pain for about a week. The pain should lessen each day. It may take a few weeks to recover completely. Trouble walking or doing your usual daily activities. You may need to use a walker for a period of time. Follow these instructions at home: Medicines Take over-the-counter and prescription medicines only as told by your health care provider. Ask your health care provider if the medicine prescribed to you: Requires you to avoid driving or using machinery. Can cause constipation. You may need to take these actions to prevent or treat constipation: Drink enough fluid to keep your urine pale yellow. Take over-the-counter or prescription medicines. Eat foods that are high in fiber, such as beans, whole grains, and fresh fruits and vegetables. Limit foods that are high in fat and processed sugars, such as fried or sweet foods. Knee immobilizer Wear the brace for 6 weeks. Loosen the brace if your toes tingle, become numb, or turn cold and blue. Keep the brace clean and dry. Bathing If the brace is not waterproof: Do not let it get wet. Cover it with a watertight covering when you take a bath or shower. Managing pain, stiffness, and swelling  If directed, put ice on the affected area. To do this: If you have a removable brace, remove it as told by your health care provider. Put ice in a plastic bag. Place a towel between your skin and the bag or between your brace and the  bag. Leave the ice on for 20 minutes, 2-3 times a day. Remove the ice if your skin turns bright red. This is very important. If you cannot feel pain, heat, or cold, you have a greater risk of damage to the area. Move your toes often to reduce stiffness and swelling. Raise (elevate) your leg above the level of your heart while you are lying down. Activity Ask your health care provider what activities are safe for you. Stop any activity that causes pain or discomfort. Ask for help with activities that are difficult. Do not use your injured leg to support (bear) your body weight until your health care provider says that you can. Follow instructions about how much weight you may safely support on your affected leg (weight-bearing restrictions). Use crutches as told by your health care provider. When you stop using your crutches, walk and move slowly until you feel stable. Do not lift anything that is heavier than 10 lb (4.5 kg), or the limit that you are told, until your health care provider says that it is safe. Do exercises as told by your health care provider. Movement restrictions  Follow all hip dislocation precautions in any position (standing, sitting, or lying) as told by your health care provider. These precautions may include the following: Do not cross your legs at the knees. To remind yourself about this, you may keep a pillow between your legs while lying in bed. Do not bend forward at the waist (to avoid hip flexion of more then 90 degrees). To  avoid bending this far: Do not bring your knees higher than the level of your hips. Do not pick up something from the floor while sitting in a chair. Avoid sitting in low chairs. Use a raised toilet seat When standing up from a seated position, keep the injured leg out in front of you. Do not bend down to a squat position while in the shower. You may need someone to help you while you shower. Avoid twisting at your waist and reaching across  your body to the side of the affected leg. Avoid rotating the toes of your affected leg inward. When getting into a car: Raise the seat as high as possible, move the seat as far back as it will go, and recline the upper part of the seat slightly. Sit down into the seat with your injured leg extended out of the car. Scoot back in the seat as you move the lower half of your body into the car. Try to avoid bumping your foot or leg as you bring it into the car. General instructions Ask your health care provider when it is safe for you to drive. Do not use any products that contain nicotine or tobacco, such as cigarettes, e-cigarettes, and chewing tobacco. These can delay healing. If you need help quitting, ask your health care provider. Keep all follow-up visits. This is important. Contact a health care provider if: Walking or moving is not getting easier for you. Your calf swells or feels tender. You have swelling that gets worse or is severe. Any part of your hip or leg feels numb, tingles, burns, or stings. You have pain that does not get better with medicine. Your brace is damaged or has gotten wet and is not waterproof. Get help right away if: You think you have dislocated your hip again. You cannot move your leg. You have trouble breathing. You have chest pain. These symptoms may represent a serious problem that is an emergency. Do not wait to see if the symptoms will go away. Get medical help right away. Call your local emergency services (911 in the [place] Do not drive yourself to the hospital. Summary Do not use your injured leg to support your body weight until your health care provider says that you can. Follow instructions about how much weight you may safely support on your affected leg (weight-bearing restrictions). Use crutches as told by your health care provider. When you stop using your crutches, move slowly at first. Keep all follow-up visits. This is important. .AMBULATORY  SURGERY  DISCHARGE INSTRUCTIONS   The drugs that you were given will stay in your system until tomorrow so for the next 24 hours you should not:  Drive an automobile Make any legal decisions Drink any alcoholic beverage   You may resume regular meals tomorrow.  Today it is better to start with liquids and gradually work up to solid foods.  You may eat anything you prefer, but it is better to start with liquids, then soup and crackers, and gradually work up to solid foods.   Please notify your doctor immediately if you have any unusual bleeding, trouble breathing, redness and pain at the surgery site, drainage, fever, or pain not relieved by medication.    Additional Instructions:        Please contact your physician with any problems or Same Day Surgery at [phone], Monday through Friday 6 am to 4 pm, or [place] at [facility] number at [phone].

## 2023-01-17 NOTE — Op Note (Signed)
Patient Name: Yvonne Singleton  [id number]  Pre-Operative Diagnosis: Left hip prosthetic dislocation  Post-Operative Diagnosis: (same)  Procedure: Closed reduction of left hip under anesthesia   Date of Surgery: 01/17/2023  Surgeon: Steffanie Rainwater MD  Assistant: none  Anesthesiologist: Rosey Bath  Anesthesia: sedation   EBL: 0  IVF: [id number]  Complications: None   Brief history: Patient is an 88 year old female who sustained a recurrent posterior left hip prosthetic dislocation status post left total hip arthroplasty.  After discussion of the risks and benefits of procedural intervention patient understood and agreed with the plan and accepts the risks and benefits to proceed with closed reduction of the left hip under anesthesia.Marland Kitchen   Description of procedure: The patient was brought to the operating room where laterality was confirmed by all those present to be the left side. A timeout was performed by all present confirming that the left side was the correct side. Patient was induced with procedural sedation on the stretcher and after adequate anesthesia was obtained the left hip was flexed internally rotated and abducted.  As longitudinal traction was applied there was a palpable clunk.  X-ray AP and crosstable lateral were performed showing concentric reduction of the femoral head without any periprosthetic fractures noted.  In the immobilizer was applied.    The patient tolerated the procedure well was transported to the recovery room in stable condition with good pulses on the procedural limb.

## 2023-01-17 NOTE — Transfer of Care (Signed)
Immediate Anesthesia Transfer of Care Note  Patient: Yvonne Singleton  Procedure(s) Performed: CLOSED REDUCTION HIP (Left: Hip)  Patient Location: PACU  Anesthesia Type:General  Level of Consciousness: drowsy  Airway & Oxygen Therapy: Patient Spontanous Breathing and Patient connected to face mask oxygen  Post-op Assessment: Report given to RN and Post -op Vital signs reviewed and stable  Post vital signs: Reviewed and stable  Last Vitals:  Vitals Value Taken Time  BP 128/59   Temp    Pulse 70   Resp 16   SpO2 98     Last Pain:  Vitals:   01/17/23 0642  TempSrc: Oral         Complications: No notable events documented.

## 2023-01-17 NOTE — Interval H&P Note (Signed)
Patient history and physical updated. Consent reviewed including risks, benefits, and alternatives to surgery. Patient agrees with above plan to proceed with closed reduction of left hip under anesthesia.

## 2023-01-17 NOTE — ED Provider Notes (Signed)
[facility] Provider Note    Event Date/Time   First MD Initiated Contact with Patient 01/17/23 [phone]     (approximate)   History   Hip Pain   HPI  Yvonne Singleton is a 88 y.o. female  who comes in with L hip pain. Pt comes in from [place] for L hip pain.  Patient reports that she resides at [facility].  She reports that she was sitting in her wheelchair when she leaned forward and felt her left hip slide out of place.  She states that currently she is not using any type of knee immobilizer or hip brace.  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 Marry Guan.  She denies falling out of her chair any chest pain, shortness of breath or any other concerns.   Physical Exam   Triage Vital Signs: ED Triage Vitals  Enc Vitals Group     BP 01/17/23 0642 (!) 161/79     Pulse Rate 01/17/23 0642 61     Resp 01/17/23 0642 14     Temp 01/17/23 0642 98 F (36.7 C)     Temp Source 01/17/23 0642 Oral     SpO2 01/17/23 0642 98 %     Weight 01/17/23 0649 171 lb (77.6 kg)     Height 01/17/23 0649 5\' 4"  (1.626 m)     Head Circumference --      Peak Flow --      Pain Score --      Pain Loc --      Pain Edu? --      Excl. in [place]? --     Most recent vital signs: Vitals:   01/17/23 0642  BP: (!) 161/79  Pulse: 61  Resp: 14  Temp: 98 F (36.7 C)  SpO2: 98%     General: Awake, no distress.  CV:  Good peripheral perfusion.  Resp:  Normal effort.  Abd:  No distention.  Other:  Patient has sensation intact in her left foot that is at baseline due to her neuropathy.  She got good distal pulse able to wiggle her toes.  She got pain on the left hip.   ED Results / Procedures / Treatments   Labs (all labs ordered are listed, but only abnormal results are displayed) Labs Reviewed - No data to display   EKG  My interpretation of EKG:  Sinus rate of 61 without any ST  elevation or T wave inversions, normal intervals  RADIOLOGY I have reviewed the xray personally and interpreted and patient has a left-sided dislocation  PROCEDURES:  Critical Care performed: No  Procedures   MEDICATIONS ORDERED IN ED: Medications  fentaNYL (SUBLIMAZE) injection 50 mcg (50 mcg Intravenous Given 01/17/23 0817)  ondansetron (ZOFRAN) injection 4 mg (4 mg Intravenous Given 01/17/23 0816)     IMPRESSION / MDM / [facility] / ED COURSE  I reviewed the triage vital signs and the nursing notes.   Patient's presentation is most consistent with acute presentation with potential threat to life or bodily function.   Patient comes in with recurrent dislocation of the left leg.  Son at bedside.  Patient on review records has not tolerated ED sedations previously with significant hypoxia and difficulty controlling pain and failed ED reduction.  She is required OR reductions multiple times I discussed with family preference for attempt of reduction in ER versus OR  they would prefer to wait for OR and decline ER reduction.  When I reviewed the notes and where she had to be admitted previously it was also the left hip on 10/31/2021 which I reviewed this hospital admission note.  I also saw the patient on 6/20 who had a right hip dislocation.  Discussed with ortho Dr Karel Jarvis will take to OR for reduction.  Given patient is already coming from a facility and uses a wheelchair at baseline does not need to be admitted to the hospital and can be discharged from postop after reduction.  I discussed this with family and they report this has been done previously and they felt comfortable with this plan.  Patient was given some IV fentanyl to help with symptoms.  BMP shows slightly elevated creatinine we will give some IV fluids, CBC normal  The patient is on the cardiac monitor to evaluate for evidence of arrhythmia and/or significant heart rate changes.      FINAL CLINICAL  IMPRESSION(S) / ED DIAGNOSES   Final diagnoses:  Anterior dislocation of left hip, initial encounter     Rx / DC Orders   ED Discharge Orders     None        Note:  This document was prepared using Dragon voice recognition software and may include unintentional dictation errors.   [name], MD 01/17/23 [phone]

## 2023-01-17 NOTE — Anesthesia Preprocedure Evaluation (Signed)
Anesthesia Evaluation  Patient identified by MRN, date of birth, ID band Patient awake    Reviewed: Allergy & Precautions, NPO status , Patient's Chart, lab work & pertinent test results  Airway Mallampati: III  TM Distance: >3 FB Neck ROM: full    Dental  (+) Dental Advidsory Given, Lower Dentures, Upper Dentures   Pulmonary neg pulmonary ROS   Pulmonary exam normal        Cardiovascular hypertension, (-) angina (-) Past MI and (-) CABG negative cardio ROS Normal cardiovascular exam     Neuro/Psych  PSYCHIATRIC DISORDERS  Depression    negative neurological ROS     GI/Hepatic Neg liver ROS, hiatal hernia,GERD  ,,  Endo/Other  negative endocrine ROS    Renal/GU negative Renal ROS  negative genitourinary   Musculoskeletal   Abdominal   Peds  Hematology negative hematology ROS (+)   Anesthesia Other Findings Past Medical History: [id number]: Acute metabolic encephalopathy No date: Back pain 2007: Breast cancer ([place])     Comment:  right breast lumpectomy with rad tx 2007: Cancer ([place])     Comment:  right breast No date: Collagen vascular disease ([place]) No date: Gout No date: Hard of hearing 05/16/2022: Headache No date: Hypertension No date: Parkinson's disease ([place]) 2007: Personal history of radiation therapy     Comment:  F/U right breast cancer 05/12/2022: UTI (urinary tract infection)  Past Surgical History: No date: ABDOMINAL HYSTERECTOMY 04/08/2022: ANTERIOR HIP REVISION; Right     Comment:  Procedure: Anterior hip revision cup and femoral head;                Surgeon: Hessie Knows, MD;  Location: [facility];                Service: Orthopedics;  Laterality: Right; 2007: BREAST LUMPECTOMY; Right     Comment:  suspicious calcs, f/u with radiation No date: CATARACT EXTRACTION; Bilateral 05/25/2017: ESOPHAGOGASTRODUODENOSCOPY (EGD) WITH PROPOFOL; N/A     Comment:  Procedure: ESOPHAGOGASTRODUODENOSCOPY (EGD)  WITH               PROPOFOL;  Surgeon: Lollie Sails, MD;  Location:               [facility];  Service: Endoscopy;  Laterality: N/A; 05/09/2018: ESOPHAGOGASTRODUODENOSCOPY (EGD) WITH PROPOFOL; N/A     Comment:  Procedure: ESOPHAGOGASTRODUODENOSCOPY (EGD) WITH               PROPOFOL;  Surgeon: Lin Landsman, MD;  Location:               [facility];  Service: Gastroenterology;  Laterality:               N/A; 12/09/2021: ESOPHAGOGASTRODUODENOSCOPY (EGD) WITH PROPOFOL; N/A     Comment:  Procedure: ESOPHAGOGASTRODUODENOSCOPY (EGD) WITH               PROPOFOL;  Surgeon: Lin Landsman, MD;  Location:               [facility];  Service: Gastroenterology;  Laterality:               N/A; No date: EYE SURGERY; Bilateral No date: HAND SURGERY 06/24/2015: HIP CLOSED REDUCTION; Left     Comment:  Procedure: CLOSED REDUCTION HIP;  Surgeon: Dereck Leep, MD;  Location: [facility];  Service: Orthopedics;  Laterality: Left; 02/09/2016: HIP CLOSED REDUCTION; Left     Comment:  Procedure: CLOSED MANIPULATION HIP;  Surgeon: Earnestine Leys, MD;  Location: [facility];  Service: Orthopedics;                Laterality: Left; 10/31/2021: HIP CLOSED REDUCTION; Left     Comment:  Procedure: CLOSED REDUCTION HIP;  Surgeon: Corky Mull, MD;  Location: [facility];  Service: Orthopedics;                Laterality: Left; No date: HIP SURGERY No date: JOINT REPLACEMENT     Comment:  bilateral hip No date: OOPHORECTOMY No date: TOTAL KNEE ARTHROPLASTY; Bilateral     Reproductive/Obstetrics negative OB ROS                             Anesthesia Physical Anesthesia Plan  ASA: 3  Anesthesia Plan: General   Post-op Pain Management: Minimal or no pain anticipated   Induction: Intravenous  PONV Risk Score and Plan: 3 and Propofol infusion, TIVA and Ondansetron  Airway Management Planned: Nasal Cannula and  Natural Airway  Additional Equipment: None  Intra-op Plan:   Post-operative Plan:   Informed Consent: I have reviewed the patients History and Physical, chart, labs and discussed the procedure including the risks, benefits and alternatives for the proposed anesthesia with the patient or authorized representative who has indicated his/her understanding and acceptance.     Dental advisory given  Plan Discussed with: CRNA and Surgeon  Anesthesia Plan Comments: (Discussed risks of anesthesia with patient, including possibility of difficulty with spontaneous ventilation under anesthesia necessitating airway intervention, PONV, and rare risks such as cardiac or respiratory or neurological events, and allergic reactions. Discussed the role of CRNA in patient's perioperative care. Patient understands.)        Anesthesia Quick Evaluation

## 2023-01-17 NOTE — H&P (View-Only) (Signed)
ORTHOPAEDIC CONSULTATION  REQUESTING PHYSICIAN: Vanessa [place], MD  Chief Complaint:   Prosthetic left hip dislocaiton  History of Present Illness: Yvonne Singleton is a 88 y.o. female who presented to the ED this morning with left hip pain and leg deformity after rolling over a large threshold and feeling her left hip dislocated.  While in her wheelchair.  She has a history of multiple dislocations of both hips and now has a constrained liner on the right and most recently dislocated her left hip in September 2023 which was closed reduced.  She reported immediate onset severe left hip pain after feeling it dislocate.  Patient reports she has good sensation over her foot I did not have any fall or head strike.  She denies any shortness of breath chest pain or other issues at the moment.  She did well after the last closed reduction.  Past Medical History:  Diagnosis Date   Acute metabolic encephalopathy [id number]   Back pain    Breast cancer [facility]) 2007   right breast lumpectomy with rad tx   Cancer [facility]) 2007   right breast   Collagen vascular disease ([place])    Gout    Hard of hearing    Headache 05/16/2022   Hypertension    Parkinson's disease    Personal history of radiation therapy 2007   F/U right breast cancer   UTI (urinary tract infection) 05/12/2022   Past Surgical History:  Procedure Laterality Date   ABDOMINAL HYSTERECTOMY     ANTERIOR HIP REVISION Right 04/08/2022   Procedure: Anterior hip revision cup and femoral head;  Surgeon: Hessie Knows, MD;  Location: [facility];  Service: Orthopedics;  Laterality: Right;   BREAST LUMPECTOMY Right 2007   suspicious calcs, f/u with radiation   CATARACT EXTRACTION Bilateral    ESOPHAGOGASTRODUODENOSCOPY (EGD) WITH PROPOFOL N/A 05/25/2017   Procedure: ESOPHAGOGASTRODUODENOSCOPY (EGD) WITH PROPOFOL;  Surgeon: Lollie Sails, MD;  Location: [facility] END[facility]COPY;  Service:  Endoscopy;  Laterality: N/A;   ESOPHAGOGASTRODUODENOSCOPY (EGD) WITH PROPOFOL N/A 05/09/2018   Procedure: ESOPHAGOGASTRODUODENOSCOPY (EGD) WITH PROPOFOL;  Surgeon: Lin Landsman, MD;  Location: [facility] ENDOSCOPY;  Service: Gastroenterology;  Laterality: N/A;   ESOPHAGOGASTRODUODENOSCOPY (EGD) WITH PROPOFOL N/A 12/09/2021   Procedure: ESOPHAGOGASTRODUODENOSCOPY (EGD) WITH PROPOFOL;  Surgeon: Lin Landsman, MD;  Location: [place];  Service: Gastroenterology;  Laterality: N/A;   EYE SURGERY Bilateral    HAND SURGERY     HIP CLOSED REDUCTION Left 06/24/2015   Procedure: CLOSED REDUCTION HIP;  Surgeon: Dereck Leep, MD;  Location: [facility];  Service: Orthopedics;  Laterality: Left;   HIP CLOSED REDUCTION Left 02/09/2016   Procedure: CLOSED MANIPULATION HIP;  Surgeon: Earnestine Leys, MD;  Location: [facility];  Service: Orthopedics;  Laterality: Left;   HIP CLOSED REDUCTION Left 10/31/2021   Procedure: CLOSED REDUCTION HIP;  Surgeon: Corky Mull, MD;  Location: [facility];  Service: Orthopedics;  Laterality: Left;   HIP CLOSED REDUCTION Left 07/05/2022   Procedure: CLOSED REDUCTION HIP;  Surgeon: Dereck Leep, MD;  Location: [facility];  Service: Orthopedics;  Laterality: Left;   HIP SURGERY     JOINT REPLACEMENT     bilateral hip   OOPHORECTOMY     TOTAL KNEE ARTHROPLASTY Bilateral    Social History   Socioeconomic History   Marital status: Widowed    Spouse name: Not on file   Number of children: Not on file   Years of education: Not on file   Highest education  level: Not on file  Occupational History   Not on file  Tobacco Use   Smoking status: Never    Passive exposure: Never   Smokeless tobacco: Never  Substance and Sexual Activity   Alcohol use: No    Alcohol/week: 0.0 standard drinks of alcohol   Drug use: No   Sexual activity: Not Currently  Other Topics Concern   Not on file  Social History Narrative   Not on file   Social Determinants of Health   Financial  Resource Strain: Not on file  Food Insecurity: No Food Insecurity (06/29/2022)   Hunger Vital Sign    Worried About Running Out of Food in the Last Year: Never true    Ran Out of Food in the Last Year: Never true  Transportation Needs: No Transportation Needs (06/29/2022)   PRAPARE - Hydrologist (Medical): No    Lack of Transportation (Non-Medical): No  Physical Activity: Not on file  Stress: Not on file  Social Connections: Not on file   Family History  Problem Relation Age of Onset   Heart disease Mother    Arthritis Mother    Heart disease Father    Ulcers Father    Breast cancer Maternal Aunt    Allergies  Allergen Reactions   Streptomycin Hives   Digoxin Other (See Comments)    Other reaction(s): Unknown Note: pt had lethargy, felt bad Note: pt had lethargy, felt bad  Other reaction(s): Unknown Note: pt had lethargy, felt bad  Other reaction(s): Unknown Note: pt had lethargy, felt bad Note: pt had lethargy, felt bad   Trospium Nausea And Vomiting   Iodinated Contrast Media Hives   Prior to Admission medications   Medication Sig Start Date End Date Taking? Authorizing Provider  amLODipine (NORVASC) 10 MG tablet Take 1 tablet (10 mg total) by mouth daily. 05/18/22   Nicole Kindred A, DO  b complex vitamins tablet Take 1 tablet by mouth daily.    [provider]  Calcium Carbonate-Vitamin D 600-200 MG-UNIT TABS Take 1 tablet by mouth 2 (two) times daily.    [provider]  carbidopa-levodopa (SINEMET CR) 50-200 MG per tablet Take 1 tablet by mouth 2 (two) times daily.    [provider]  cetirizine (ZYRTEC) 5 MG tablet 5 mg DAILY (route: oral) 05/26/22   [provider]  cholecalciferol (VITAMIN D) 1000 units tablet Take 1,000 Units by mouth daily.    [provider]  gabapentin (NEURONTIN) 300 MG capsule Take 1 capsule (300 mg total) by mouth 2 (two) times daily. 04/12/22   Nolberto Hanlon, MD   latanoprost (XALATAN) 0.005 % ophthalmic solution Place 1 drop into both eyes every evening. 02/19/22   [provider]  lisinopril (ZESTRIL) 2.5 MG tablet Take 2.5 mg by mouth daily. 06/28/22   [provider]  oxybutynin (DITROPAN-XL) 10 MG 24 hr tablet Take 1 tablet (10 mg total) by mouth daily. 01/03/23   Bjorn Loser, MD  oxyCODONE (OXY IR/ROXICODONE) 5 MG immediate release tablet Take 1 tablet (5 mg total) by mouth every 6 (six) hours as needed for severe pain or moderate pain. 07/01/22   Amin, Jeanella Flattery, MD  pantoprazole (PROTONIX) 40 MG tablet Take 1 tablet (40 mg total) by mouth 2 (two) times daily before a meal. 12/09/21 07/05/22  Vanga, Tally Due, MD  Polyethyl Glycol-Propyl Glycol (SYSTANE) 0.4-0.3 % SOLN Place 1 drop into both eyes daily. 05/26/22   [provider]  sertraline (ZOLOFT) 100 MG tablet Take 100 mg by mouth daily. 09/30/21   [provider]   DG Chest Portable 1 View  Result Date: 01/17/2023 CLINICAL DATA:  Hip dislocation. EXAM: PORTABLE CHEST 1 VIEW COMPARISON:  06/29/2022 FINDINGS: Prominent cardiac silhouette. Left base consolidation and effusion. No pneumothorax. Calcified aorta. IMPRESSION: Left base consolidation and effusion. Electronically Signed   By: Sammie Bench M.D.   On: 01/17/2023 07:43   DG Hip Unilat W or Wo Pelvis 2-3 Views Left  Result Date: 01/17/2023 CLINICAL DATA:  Pain EXAM: DG HIP (WITH OR WITHOUT PELVIS) 3V LEFT COMPARISON:  08/04/2022 FINDINGS: Bilateral hip prostheses. There is dislocation on the left. No acute fracture identified. There are lumbosacral degenerative changes. IMPRESSION: Negative. Bilateral hip prostheses with left-sided dislocation. Electronically Signed   By: Sammie Bench M.D.   On: 01/17/2023 07:41    Positive ROS: All other systems have been reviewed and were otherwise negative with the exception of those mentioned in the HPI and as above.  Physical Exam: General:  Alert, no acute  distress Psychiatric:  Patient is competent for consent with normal mood and affect   Cardiovascular:  No pedal edema Respiratory:  No wheezing, non-labored breathing GI:  Abdomen is soft and non-tender Skin:  No lesions in the area of chief complaint Neurologic:  Sensation intact distally Lymphatic:  No axillary or cervical lymphadenopathy  Orthopedic Exam:  Left lower extremity Short externally rotated skin intact Good dorsalis pedis pulse brisk capillary refill compartments soft and lower extremity No tenderness palpation over the knee foot or ankle Neurovascular intact of the foot  Secondary survey No tenderness to palpation over other bony prominences in the lower extremities or bilateral upper extremities No pain with logroll or simulated axial loading of the right lower extremity All compartments soft No tenderness to palpation over the cervical or thoracic spine, no bony step-off Motor grossly intact throughout, no focal deficits Sensation grossly intact throughout, no focal deficits Good distal pulses and capillary refill on all extremities   X-rays:  AP pelvis and lateral left hip x-rays ordered and performed today in the emergency room images and report reviewed myself.  There is a periprosthetic left superior posterior hip dislocation.  No periprosthetic fractures noted. right hip status post total replacement with components in appropriate position with no evidence of periprosthetic fracture or dislocation.  Assessment: Left hip prosthetic dislocation  Plan: Yvonne Singleton is an 88 year old female who presents with recurrent left posterior hip dislocation.  I reviewed the clinical and radiographic findings with the patient and her son at bedside in the emergency room and discussed the nature of hip dislocation and closed reduction under anesthesia.  The patient had issues with anesthesia in the emergency room in the past and would like to undergo anesthesia in the operating room  setting for the reduction if possible.  A long discussion took place with the patient describing what a close reduction under anesthesia is and what the procedure would entail.    The hospitalization and post-operative care and rehabilitation were also discussed.  A lengthy discussion took place to review the most common complications including but not limited to: Neurovascular injury during reduction, fracture, prosthetic dissociation or dislocation, prosthetic fracture, need for repeat reduction, need for surgical reduction or possible future surgery for increased constraint of the hip,ed and potential precautions to prevent dislocation were reviewed.      The benefits of closed reduction were discussed with the patient including the potential for improving the  patient's current clinical condition through procedural intervention. Alternatives to intervention including conservative management were also discussed in detail. All questions were answered to the satisfaction of the patient. The patient participated and agreed to the plan of care as well as the use of the recommended implants for their surgery.    Plan for closed reduction under anesthesia this morning Patient n.p.o. from 11 PM last night please maintain n.p.o. status for close reduction  Steffanie Rainwater MD     Steffanie Rainwater MD  Beeper #:  [phone]  01/17/2023 8:43 AM

## 2023-01-17 NOTE — Consult Note (Signed)
ORTHOPAEDIC CONSULTATION  REQUESTING PHYSICIAN: Vanessa [place], [place]  Chief Complaint:   Prosthetic left hip dislocaiton  History of Present Illness: Yvonne Singleton is a 88 y.o. female who presented to the ED this morning with left hip pain and leg deformity after rolling over a large threshold and feeling her left hip dislocated.  While in her wheelchair.  She has a history of multiple dislocations of both hips and now has a constrained liner on the right and most recently dislocated her left hip in September 2023 which was closed reduced.  She reported immediate onset severe left hip pain after feeling it dislocate.  Patient reports she has good sensation over her foot I did not have any fall or head strike.  She denies any shortness of breath chest pain or other issues at the moment.  She did well after the last closed reduction.  Past Medical History:  Diagnosis Date   Acute metabolic encephalopathy [id number]   Back pain    Breast cancer [facility]) 2007   right breast lumpectomy with rad tx   Cancer [facility]) 2007   right breast   Collagen vascular disease ([place])    Gout    Hard of hearing    Headache 05/16/2022   Hypertension    Parkinson's disease    Personal history of radiation therapy 2007   F/U right breast cancer   UTI (urinary tract infection) 05/12/2022   Past Surgical History:  Procedure Laterality Date   ABDOMINAL HYSTERECTOMY     ANTERIOR HIP REVISION Right 04/08/2022   Procedure: Anterior hip revision cup and femoral head;  Surgeon: Hessie Knows, MD;  Location: [facility] ORS;  Service: Orthopedics;  Laterality: Right;   BREAST LUMPECTOMY Right 2007   suspicious calcs, f/u with radiation   CATARACT EXTRACTION Bilateral    ESOPHAGOGASTRODUODENOSCOPY (EGD) WITH PROPOFOL N/A 05/25/2017   Procedure: ESOPHAGOGASTRODUODENOSCOPY (EGD) WITH PROPOFOL;  Surgeon: Lollie Sails, MD;  Location: [facility] ENDOSCOPY;  Service:  Endoscopy;  Laterality: N/A;   ESOPHAGOGASTRODUODENOSCOPY (EGD) WITH PROPOFOL N/A 05/09/2018   Procedure: ESOPHAGOGASTRODUODENOSCOPY (EGD) WITH PROPOFOL;  Surgeon: Lin Landsman, MD;  Location: [facility] ENDOSCOPY;  Service: Gastroenterology;  Laterality: N/A;   ESOPHAGOGASTRODUODENOSCOPY (EGD) WITH PROPOFOL N/A 12/09/2021   Procedure: ESOPHAGOGASTRODUODENOSCOPY (EGD) WITH PROPOFOL;  Surgeon: Lin Landsman, MD;  Location: [place];  Service: Gastroenterology;  Laterality: N/A;   EYE SURGERY Bilateral    HAND SURGERY     HIP CLOSED REDUCTION Left 06/24/2015   Procedure: CLOSED REDUCTION HIP;  Surgeon: Dereck Leep, MD;  Location: [facility] ORS;  Service: Orthopedics;  Laterality: Left;   HIP CLOSED REDUCTION Left 02/09/2016   Procedure: CLOSED MANIPULATION HIP;  Surgeon: Earnestine Leys, MD;  Location: [facility] ORS;  Service: Orthopedics;  Laterality: Left;   HIP CLOSED REDUCTION Left 10/31/2021   Procedure: CLOSED REDUCTION HIP;  Surgeon: Corky Mull, MD;  Location: [facility] ORS;  Service: Orthopedics;  Laterality: Left;   HIP CLOSED REDUCTION Left 07/05/2022   Procedure: CLOSED REDUCTION HIP;  Surgeon: Dereck Leep, MD;  Location: [facility] ORS;  Service: Orthopedics;  Laterality: Left;   HIP SURGERY     JOINT REPLACEMENT     bilateral hip   OOPHORECTOMY     TOTAL KNEE ARTHROPLASTY Bilateral    Social History   Socioeconomic History   Marital status: Widowed    Spouse name: Not on file   Number of children: Not on file   Years of education: Not on file   Highest education  level: Not on file  Occupational History   Not on file  Tobacco Use   Smoking status: Never    Passive exposure: Never   Smokeless tobacco: Never  Substance and Sexual Activity   Alcohol use: No    Alcohol/week: 0.0 standard drinks of alcohol   Drug use: No   Sexual activity: Not Currently  Other Topics Concern   Not on file  Social History Narrative   Not on file   Social Determinants of Health   Financial  Resource Strain: Not on file  Food Insecurity: No Food Insecurity (06/29/2022)   Hunger Vital Sign    Worried About Running Out of Food in the Last Year: Never true    Ran Out of Food in the Last Year: Never true  Transportation Needs: No Transportation Needs (06/29/2022)   PRAPARE - Hydrologist (Medical): No    Lack of Transportation (Non-Medical): No  Physical Activity: Not on file  Stress: Not on file  Social Connections: Not on file   Family History  Problem Relation Age of Onset   Heart disease Mother    Arthritis Mother    Heart disease Father    Ulcers Father    Breast cancer Maternal Aunt    Allergies  Allergen Reactions   Streptomycin Hives   Digoxin Other (See Comments)    Other reaction(s): Unknown Note: pt had lethargy, felt bad Note: pt had lethargy, felt bad  Other reaction(s): Unknown Note: pt had lethargy, felt bad  Other reaction(s): Unknown Note: pt had lethargy, felt bad Note: pt had lethargy, felt bad   Trospium Nausea And Vomiting   Iodinated Contrast Media Hives   Prior to Admission medications   Medication Sig Start Date End Date Taking? Authorizing Provider  amLODipine (NORVASC) 10 MG tablet Take 1 tablet (10 mg total) by mouth daily. 05/18/22   Nicole Kindred A, DO  b complex vitamins tablet Take 1 tablet by mouth daily.    [provider]  Calcium Carbonate-Vitamin D 600-200 MG-UNIT TABS Take 1 tablet by mouth 2 (two) times daily.    [provider]  carbidopa-levodopa (SINEMET CR) 50-200 MG per tablet Take 1 tablet by mouth 2 (two) times daily.    [provider]  cetirizine (ZYRTEC) 5 MG tablet 5 mg DAILY (route: oral) 05/26/22   [provider]  cholecalciferol (VITAMIN D) 1000 units tablet Take 1,000 Units by mouth daily.    [provider]  gabapentin (NEURONTIN) 300 MG capsule Take 1 capsule (300 mg total) by mouth 2 (two) times daily. 04/12/22   Nolberto Hanlon, MD   latanoprost (XALATAN) 0.005 % ophthalmic solution Place 1 drop into both eyes every evening. 02/19/22   [provider]  lisinopril (ZESTRIL) 2.5 MG tablet Take 2.5 mg by mouth daily. 06/28/22   [provider]  oxybutynin (DITROPAN-XL) 10 MG 24 hr tablet Take 1 tablet (10 mg total) by mouth daily. 01/03/23   Bjorn Loser, MD  oxyCODONE (OXY IR/ROXICODONE) 5 MG immediate release tablet Take 1 tablet (5 mg total) by mouth every 6 (six) hours as needed for severe pain or moderate pain. 07/01/22   Amin, Jeanella Flattery, MD  pantoprazole (PROTONIX) 40 MG tablet Take 1 tablet (40 mg total) by mouth 2 (two) times daily before a meal. 12/09/21 07/05/22  Vanga, Tally Due, MD  Polyethyl Glycol-Propyl Glycol (SYSTANE) 0.4-0.3 % SOLN Place 1 drop into both eyes daily. 05/26/22   [provider]  sertraline (ZOLOFT) 100 MG tablet Take 100 mg by mouth daily. 09/30/21   [provider]   DG Chest Portable 1 View  Result Date: 01/17/2023 CLINICAL DATA:  Hip dislocation. EXAM: PORTABLE CHEST 1 VIEW COMPARISON:  06/29/2022 FINDINGS: Prominent cardiac silhouette. Left base consolidation and effusion. No pneumothorax. Calcified aorta. IMPRESSION: Left base consolidation and effusion. Electronically Signed   By: Sammie Bench M.D.   On: 01/17/2023 07:43   DG Hip Unilat W or Wo Pelvis 2-3 Views Left  Result Date: 01/17/2023 CLINICAL DATA:  Pain EXAM: DG HIP (WITH OR WITHOUT PELVIS) 3V LEFT COMPARISON:  08/04/2022 FINDINGS: Bilateral hip prostheses. There is dislocation on the left. No acute fracture identified. There are lumbosacral degenerative changes. IMPRESSION: Negative. Bilateral hip prostheses with left-sided dislocation. Electronically Signed   By: Sammie Bench M.D.   On: 01/17/2023 07:41    Positive ROS: All other systems have been reviewed and were otherwise negative with the exception of those mentioned in the HPI and as above.  Physical Exam: General:  Alert, no acute  distress Psychiatric:  Patient is competent for consent with normal mood and affect   Cardiovascular:  No pedal edema Respiratory:  No wheezing, non-labored breathing GI:  Abdomen is soft and non-tender Skin:  No lesions in the area of chief complaint Neurologic:  Sensation intact distally Lymphatic:  No axillary or cervical lymphadenopathy  Orthopedic Exam:  Left lower extremity Short externally rotated skin intact Good dorsalis pedis pulse brisk capillary refill compartments soft and lower extremity No tenderness palpation over the knee foot or ankle Neurovascular intact of the foot  Secondary survey No tenderness to palpation over other bony prominences in the lower extremities or bilateral upper extremities No pain with logroll or simulated axial loading of the right lower extremity All compartments soft No tenderness to palpation over the cervical or thoracic spine, no bony step-off Motor grossly intact throughout, no focal deficits Sensation grossly intact throughout, no focal deficits Good distal pulses and capillary refill on all extremities   X-rays:  AP pelvis and lateral left hip x-rays ordered and performed today in the emergency room images and report reviewed myself.  There is a periprosthetic left superior posterior hip dislocation.  No periprosthetic fractures noted. right hip status post total replacement with components in appropriate position with no evidence of periprosthetic fracture or dislocation.  Assessment: Left hip prosthetic dislocation  Plan: Yvonne Singleton is an 88 year old female who presents with recurrent left posterior hip dislocation.  I reviewed the clinical and radiographic findings with the patient and her son at bedside in the emergency room and discussed the nature of hip dislocation and closed reduction under anesthesia.  The patient had issues with anesthesia in the emergency room in the past and would like to undergo anesthesia in the operating room  setting for the reduction if possible.  A long discussion took place with the patient describing what a close reduction under anesthesia is and what the procedure would entail.    The hospitalization and post-operative care and rehabilitation were also discussed.  A lengthy discussion took place to review the most common complications including but not limited to: Neurovascular injury during reduction, fracture, prosthetic dissociation or dislocation, prosthetic fracture, need for repeat reduction, need for surgical reduction or possible future surgery for increased constraint of the hip,ed and potential precautions to prevent dislocation were reviewed.      The benefits of closed reduction were discussed with the patient including the potential for improving the  patient's current clinical condition through procedural intervention. Alternatives to intervention including conservative management were also discussed in detail. All questions were answered to the satisfaction of the patient. The patient participated and agreed to the plan of care as well as the use of the recommended implants for their surgery.    Plan for closed reduction under anesthesia this morning Patient n.p.o. from 11 PM last night please maintain n.p.o. status for close reduction  Steffanie Rainwater MD     Steffanie Rainwater MD  Beeper #:  [phone]  01/17/2023 8:43 AM

## 2023-01-17 NOTE — ED Notes (Signed)
Report given to OR at this time.

## 2023-01-17 NOTE — ED Triage Notes (Signed)
Pt BIB EMS from [facility] where she was sitting in her wheel chair leaning forwards in the chair when she had sharp pain in her L hip. Pt has a hx of surgical repair and also dislocations of both hips. Rotation noted to the L foot inward. No falls, loc, or injuries.

## 2023-01-18 NOTE — Anesthesia Postprocedure Evaluation (Signed)
Anesthesia Post Note  Patient: Bellamia Tarman  Procedure(s) Performed: CLOSED REDUCTION HIP (Left: Hip)  Patient location during evaluation: PACU Anesthesia Type: General Level of consciousness: awake and alert Pain management: pain level controlled Vital Signs Assessment: post-procedure vital signs reviewed and stable Respiratory status: spontaneous breathing, nonlabored ventilation, respiratory function stable and patient connected to nasal cannula oxygen Cardiovascular status: blood pressure returned to baseline and stable Postop Assessment: no apparent nausea or vomiting Anesthetic complications: no   No notable events documented.   Last Vitals:  Vitals:   01/17/23 1000 01/17/23 1023  BP: (!) 124/51 (!) 124/47  Pulse: 60 (!) 56  Resp: 16 18  Temp:  (!) 36.2 C  SpO2: 96% 100%    Last Pain:  Vitals:   01/17/23 1023  TempSrc:   PainSc: 0-No pain                 Martha Clan

## 2023-01-30 ENCOUNTER — Other Ambulatory Visit: Payer: Self-pay | Admitting: Family

## 2023-01-30 MED ORDER — ZINC OXIDE 40 % EX OINT: 1.0000 | TOPICAL_OINTMENT | CUTANEOUS | 4 refills | Status: DC | PRN

## 2023-01-31 ENCOUNTER — Ambulatory Visit: Payer: Medicare Other | Admitting: Internal Medicine

## 2023-02-07 ENCOUNTER — Telehealth: Payer: Self-pay

## 2023-02-09 NOTE — Telephone Encounter (Signed)
We set up virtual visit for pt

## 2023-02-11 ENCOUNTER — Encounter: Payer: Self-pay | Admitting: Family

## 2023-02-11 ENCOUNTER — Ambulatory Visit (INDEPENDENT_AMBULATORY_CARE_PROVIDER_SITE_OTHER): Payer: Medicare Other | Admitting: Family

## 2023-02-11 DIAGNOSIS — L89312 Pressure ulcer of right buttock, stage 2: Secondary | ICD-10-CM

## 2023-02-11 DIAGNOSIS — L89322 Pressure ulcer of left buttock, stage 2: Secondary | ICD-10-CM

## 2023-02-11 NOTE — Progress Notes (Addendum)
Acute Office Visit  Subjective:     Patient ID: Yvonne Singleton, female    DOB: [birth year], 88 y.o.   MRN: [id number]  Chief Complaint  Patient presents with   Wound Check    Patient seen today via Video Virtual Visit for a wound on her buttocks.  She is at [facility], and they have asked [place] to evaluate this and send orders to [facility] for wound care.   Wound is on bilateral buttocks, and is covered with zinc oxide.  There is a small amount of serosanguinous drainage.  No other concerns at this time.    Review of Systems  Skin:        Wound on buttocks.  All other systems reviewed and are negative.       Objective:    There were no vitals taken for this visit.  Physical Exam Vitals and nursing note reviewed.  Constitutional:      General: She is not in acute distress.    Appearance: Normal appearance. She is normal weight. She is not ill-appearing.  HENT:     Head: Normocephalic.  Pulmonary:     Effort: Pulmonary effort is normal.  Skin:    Findings: Wound present.       Neurological:     General: No focal deficit present.     Mental Status: She is alert and oriented to person, place, and time. Mental status is at baseline.  Psychiatric:        Mood and Affect: Mood normal.        Behavior: Behavior normal.        Thought Content: Thought content normal.        Judgment: Judgment normal.     No results found for any visits on 02/11/23.  Recent Results (from the past 2160 hour(s))  Urinalysis, Complete     Status: Abnormal   Collection Time: 12/06/22  2:33 PM  Result Value Ref Range   Specific Gravity, UA 1.010 1.005 - 1.030   pH, UA 5.5 5.0 - 7.5   Color, UA Yellow Yellow   Appearance Ur Hazy (A) Clear   Leukocytes,UA 2+ (A) Negative   Protein,UA Negative Negative/Trace   Glucose, UA Negative Negative   Ketones, UA Negative Negative   RBC, UA Trace (A) Negative   Bilirubin, UA Negative Negative   Urobilinogen, Ur 0.2 0.2 - 1.0  mg/dL   Nitrite, UA Negative Negative   Microscopic Examination See below:   Microscopic Examination     Status: Abnormal   Collection Time: 12/06/22  2:33 PM   Urine  Result Value Ref Range   WBC, UA >30 (A) 0 - 5 /hpf   RBC, Urine 3-10 (A) 0 - 2 /hpf   Epithelial Cells (non renal) 0-10 0 - 10 /hpf   Casts Present (A) None seen /lpf   Cast Type Hyaline casts N/A   Mucus, UA Present (A) Not Estab.   Bacteria, UA Moderate (A) None seen/Few  CBC with Differential     Status: None   Collection Time: 01/17/23  6:54 AM  Result Value Ref Range   WBC 8.2 4.0 - 10.5 K/uL   RBC 4.16 3.87 - 5.11 MIL/uL   Hemoglobin 13.1 12.0 - 15.0 g/dL   HCT 32.9 51.8 - 84.1 %   MCV 99.3 80.0 - 100.0 fL   MCH 31.5 26.0 - 34.0 pg   MCHC 31.7 30.0 - 36.0 g/dL   RDW 66.0 63.0 - 16.0 %  Platelets 261 150 - 400 K/uL   nRBC 0.0 0.0 - 0.2 %   Neutrophils Relative % 56 %   Neutro Abs 4.8 1.7 - 7.7 K/uL   Lymphocytes Relative 31 %   Lymphs Abs 2.5 0.7 - 4.0 K/uL   Monocytes Relative 7 %   Monocytes Absolute 0.6 0.1 - 1.0 K/uL   Eosinophils Relative 4 %   Eosinophils Absolute 0.3 0.0 - 0.5 K/uL   Basophils Relative 1 %   Basophils Absolute 0.0 0.0 - 0.1 K/uL   Immature Granulocytes 1 %   Abs Immature Granulocytes 0.04 0.00 - 0.07 K/uL    Comment: Performed at [facility], [address] [place], [place] [address]  Basic metabolic panel     Status: Abnormal   Collection Time: 01/17/23  6:54 AM  Result Value Ref Range   Sodium 141 135 - 145 mmol/L   Potassium 4.8 3.5 - 5.1 mmol/L   Chloride 105 98 - 111 mmol/L   CO2 26 22 - 32 mmol/L   Glucose, Bld 96 70 - 99 mg/dL    Comment: Glucose reference range applies only to samples taken after fasting for at least 8 hours.   BUN 53 (H) 8 - 23 mg/dL   Creatinine, Ser 6.04 (H) 0.44 - 1.00 mg/dL   Calcium 9.2 8.9 - 54.0 mg/dL   GFR, Estimated 46 (L) >60 mL/min    Comment: (NOTE) Calculated using the CKD-EPI Creatinine Equation (2021)    Anion gap  10 5 - 15    Comment: Performed at [facility], [address] [place], [place] [address]       Assessment & Plan:   Problem List Items Addressed This Visit   None Visit Diagnoses     Pressure injury of left buttock, stage 2 (HCC)    -  Primary   Apply aquacel ag foam dressing.  Replace when soiled or at least every 5 days.   Pressure injury of right buttock, stage 2 (HCC)       Apply aquacel ag foam dressing. Replace when soiled or at least every 5 days.        No follow-ups on file.  Total time spent: 20 minutes  Miki Kins, FNP  02/11/2023

## 2023-02-17 ENCOUNTER — Ambulatory Visit (INDEPENDENT_AMBULATORY_CARE_PROVIDER_SITE_OTHER): Payer: Medicare Other | Admitting: Internal Medicine

## 2023-02-17 ENCOUNTER — Encounter: Payer: Self-pay | Admitting: Internal Medicine

## 2023-02-17 VITALS — BP 120/68 | HR 61 | Ht 63.5 in | Wt 170.0 lb

## 2023-02-17 DIAGNOSIS — K219 Gastro-esophageal reflux disease without esophagitis: Secondary | ICD-10-CM | POA: Diagnosis not present

## 2023-02-17 DIAGNOSIS — T84021A Dislocation of internal left hip prosthesis, initial encounter: Secondary | ICD-10-CM

## 2023-02-17 DIAGNOSIS — G20A2 Parkinson's disease without dyskinesia, with fluctuations: Secondary | ICD-10-CM | POA: Diagnosis not present

## 2023-02-17 DIAGNOSIS — E782 Mixed hyperlipidemia: Secondary | ICD-10-CM

## 2023-02-17 DIAGNOSIS — I10 Essential (primary) hypertension: Secondary | ICD-10-CM

## 2023-02-17 DIAGNOSIS — M1000 Idiopathic gout, unspecified site: Secondary | ICD-10-CM

## 2023-02-17 DIAGNOSIS — L89321 Pressure ulcer of left buttock, stage 1: Secondary | ICD-10-CM

## 2023-02-17 NOTE — Progress Notes (Signed)
Established Patient Office Visit  Subjective:  Patient ID: Yvonne Singleton, female    DOB: [birth year]  Age: 88 y.o. MRN: [id number]  Chief Complaint  Patient presents with   Follow-up    3 month follow up    Patient comes in for her 26-month follow-up today.  She she is having trouble with her left hip getting dislocated multiple times.  She is wearing a brace currently.  Denies any nausea or vomiting, no chest pain and no shortness of breath.  Recently she was evaluated on a video call for pressure sores on both hips.  Aquacel Ag foam dressing was recommended, but patient has not received it so Desitin was being applied as before. However it seems that the skin is healing nicely with Desitin alone. Patient has recently had blood work during her emergency room visits so we will defer at this time.    No other concerns at this time.   Past Medical History:  Diagnosis Date   Acute metabolic encephalopathy 05/12/2022   Back pain    Breast cancer [facility]) 2007   right breast lumpectomy with rad tx   Cancer [facility]) 2007   right breast   Collagen vascular disease (HCC)    Gout    Hard of hearing    Headache 05/16/2022   Hypertension    Parkinson's disease    Personal history of radiation therapy 2007   F/U right breast cancer   UTI (urinary tract infection) 05/12/2022    Past Surgical History:  Procedure Laterality Date   ABDOMINAL HYSTERECTOMY     ANTERIOR HIP REVISION Right 04/08/2022   Procedure: Anterior hip revision cup and femoral head;  Surgeon: Kennedy Bucker, MD;  Location: [facility] ORS;  Service: Orthopedics;  Laterality: Right;   BREAST LUMPECTOMY Right 2007   suspicious calcs, f/u with radiation   CATARACT EXTRACTION Bilateral    ESOPHAGOGASTRODUODENOSCOPY (EGD) WITH PROPOFOL N/A 05/25/2017   Procedure: ESOPHAGOGASTRODUODENOSCOPY (EGD) WITH PROPOFOL;  Surgeon: Christena Deem, MD;  Location: [facility] ENDOSCOPY;  Service: Endoscopy;  Laterality: N/A;    ESOPHAGOGASTRODUODENOSCOPY (EGD) WITH PROPOFOL N/A 05/09/2018   Procedure: ESOPHAGOGASTRODUODENOSCOPY (EGD) WITH PROPOFOL;  Surgeon: Toney Reil, MD;  Location: [facility] ENDOSCOPY;  Service: Gastroenterology;  Laterality: N/A;   ESOPHAGOGASTRODUODENOSCOPY (EGD) WITH PROPOFOL N/A 12/09/2021   Procedure: ESOPHAGOGASTRODUODENOSCOPY (EGD) WITH PROPOFOL;  Surgeon: Toney Reil, MD;  Location: [facility] ENDOSCOPY;  Service: Gastroenterology;  Laterality: N/A;   EYE SURGERY Bilateral    HAND SURGERY     HIP CLOSED REDUCTION Left 06/24/2015   Procedure: CLOSED REDUCTION HIP;  Surgeon: Donato Heinz, MD;  Location: [facility] ORS;  Service: Orthopedics;  Laterality: Left;   HIP CLOSED REDUCTION Left 02/09/2016   Procedure: CLOSED MANIPULATION HIP;  Surgeon: Deeann Saint, MD;  Location: [facility] ORS;  Service: Orthopedics;  Laterality: Left;   HIP CLOSED REDUCTION Left 10/31/2021   Procedure: CLOSED REDUCTION HIP;  Surgeon: Christena Flake, MD;  Location: [facility] ORS;  Service: Orthopedics;  Laterality: Left;   HIP CLOSED REDUCTION Left 07/05/2022   Procedure: CLOSED REDUCTION HIP;  Surgeon: Donato Heinz, MD;  Location: [facility] ORS;  Service: Orthopedics;  Laterality: Left;   HIP CLOSED REDUCTION Left 01/17/2023   Procedure: CLOSED REDUCTION HIP;  Surgeon: Reinaldo Berber, MD;  Location: [facility] ORS;  Service: Orthopedics;  Laterality: Left;   HIP SURGERY     JOINT REPLACEMENT     bilateral hip   OOPHORECTOMY     TOTAL KNEE ARTHROPLASTY Bilateral  Social History   Socioeconomic History   Marital status: Widowed    Spouse name: Not on file   Number of children: Not on file   Years of education: Not on file   Highest education level: Not on file  Occupational History   Not on file  Tobacco Use   Smoking status: Never    Passive exposure: Never   Smokeless tobacco: Never  Substance and Sexual Activity   Alcohol use: No    Alcohol/week: 0.0 standard drinks of alcohol   Drug use: No   Sexual activity: Not  Currently  Other Topics Concern   Not on file  Social History Narrative   Not on file   Social Determinants of Health   Financial Resource Strain: Not on file  Food Insecurity: No Food Insecurity (06/29/2022)   Hunger Vital Sign    Worried About Running Out of Food in the Last Year: Never true    Ran Out of Food in the Last Year: Never true  Transportation Needs: No Transportation Needs (06/29/2022)   PRAPARE - Administrator, Civil Service (Medical): No    Lack of Transportation (Non-Medical): No  Physical Activity: Not on file  Stress: Not on file  Social Connections: Not on file  Intimate Partner Violence: Not At Risk (06/29/2022)   Humiliation, Afraid, Rape, and Kick questionnaire    Fear of Current or Ex-Partner: No    Emotionally Abused: No    Physically Abused: No    Sexually Abused: No    Family History  Problem Relation Age of Onset   Heart disease Mother    Arthritis Mother    Heart disease Father    Ulcers Father    Breast cancer Maternal Aunt     Allergies  Allergen Reactions   Streptomycin Hives   Digoxin Other (See Comments)    Other reaction(s): Unknown Note: pt had lethargy, felt bad Note: pt had lethargy, felt bad  Other reaction(s): Unknown Note: pt had lethargy, felt bad  Other reaction(s): Unknown Note: pt had lethargy, felt bad Note: pt had lethargy, felt bad   Trospium Nausea And Vomiting   Iodinated Contrast Media Hives    Review of Systems  Constitutional:  Negative for chills, fever, malaise/fatigue and weight loss.  HENT: Negative.    Eyes: Negative.   Respiratory:  Negative for cough, shortness of breath and wheezing.   Cardiovascular:  Negative for chest pain, claudication, leg swelling and PND.  Gastrointestinal:  Negative for abdominal pain, constipation, diarrhea, heartburn, nausea and vomiting.  Genitourinary: Negative.   Musculoskeletal: Negative.  Negative for back pain, falls, joint pain, myalgias and neck pain.   Neurological:  Negative for dizziness, tingling, tremors, sensory change, seizures, weakness and headaches.  Psychiatric/Behavioral:  Negative for depression, memory loss, substance abuse and suicidal ideas. The patient is not nervous/anxious.        Objective:   BP 120/68   Pulse 61   Ht 5' 3.5" (1.613 m)   Wt 170 lb (77.1 kg)   SpO2 97%   BMI 29.64 kg/m   Vitals:   02/17/23 1509  BP: 120/68  Pulse: 61  Height: 5' 3.5" (1.613 m)  Weight: 170 lb (77.1 kg)  SpO2: 97%  BMI (Calculated): 29.64    Physical Exam Constitutional:      Appearance: Normal appearance.  Cardiovascular:     Rate and Rhythm: Normal rate and regular rhythm.     Pulses: Normal pulses.     Heart  sounds: Normal heart sounds.  Pulmonary:     Effort: Pulmonary effort is normal.     Breath sounds: Normal breath sounds.  Abdominal:     General: Bowel sounds are normal.  Musculoskeletal:        General: Normal range of motion.     Cervical back: Normal range of motion and neck supple.     Right lower leg: No edema.     Left lower leg: Edema present.  Skin:    General: Skin is warm and dry.  Neurological:     General: No focal deficit present.     Mental Status: She is alert and oriented to person, place, and time.  Psychiatric:        Mood and Affect: Mood normal.        Behavior: Behavior normal.      No results found for any visits on 02/17/23.  Recent Results (from the past 2160 hour(s))  Urinalysis, Complete     Status: Abnormal   Collection Time: 12/06/22  2:33 PM  Result Value Ref Range   Specific Gravity, UA 1.010 1.005 - 1.030   pH, UA 5.5 5.0 - 7.5   Color, UA Yellow Yellow   Appearance Ur Hazy (A) Clear   Leukocytes,UA 2+ (A) Negative   Protein,UA Negative Negative/Trace   Glucose, UA Negative Negative   Ketones, UA Negative Negative   RBC, UA Trace (A) Negative   Bilirubin, UA Negative Negative   Urobilinogen, Ur 0.2 0.2 - 1.0 mg/dL   Nitrite, UA Negative Negative    Microscopic Examination See below:   Microscopic Examination     Status: Abnormal   Collection Time: 12/06/22  2:33 PM   Urine  Result Value Ref Range   WBC, UA >30 (A) 0 - 5 /hpf   RBC, Urine 3-10 (A) 0 - 2 /hpf   Epithelial Cells (non renal) 0-10 0 - 10 /hpf   Casts Present (A) None seen /lpf   Cast Type Hyaline casts N/A   Mucus, UA Present (A) Not Estab.   Bacteria, UA Moderate (A) None seen/Few  CBC with Differential     Status: None   Collection Time: 01/17/23  6:54 AM  Result Value Ref Range   WBC 8.2 4.0 - 10.5 K/uL   RBC 4.16 3.87 - 5.11 MIL/uL   Hemoglobin 13.1 12.0 - 15.0 g/dL   HCT 16.1 09.6 - 04.5 %   MCV 99.3 80.0 - 100.0 fL   MCH 31.5 26.0 - 34.0 pg   MCHC 31.7 30.0 - 36.0 g/dL   RDW 40.9 81.1 - 91.4 %   Platelets 261 150 - 400 K/uL   nRBC 0.0 0.0 - 0.2 %   Neutrophils Relative % 56 %   Neutro Abs 4.8 1.7 - 7.7 K/uL   Lymphocytes Relative 31 %   Lymphs Abs 2.5 0.7 - 4.0 K/uL   Monocytes Relative 7 %   Monocytes Absolute 0.6 0.1 - 1.0 K/uL   Eosinophils Relative 4 %   Eosinophils Absolute 0.3 0.0 - 0.5 K/uL   Basophils Relative 1 %   Basophils Absolute 0.0 0.0 - 0.1 K/uL   Immature Granulocytes 1 %   Abs Immature Granulocytes 0.04 0.00 - 0.07 K/uL    Comment: Performed at [facility], [address], [place], [place] [address]  Basic metabolic panel     Status: Abnormal   Collection Time: 01/17/23  6:54 AM  Result Value Ref Range   Sodium 141 135 -  145 mmol/L   Potassium 4.8 3.5 - 5.1 mmol/L   Chloride 105 98 - 111 mmol/L   CO2 26 22 - 32 mmol/L   Glucose, Bld 96 70 - 99 mg/dL    Comment: Glucose reference range applies only to samples taken after fasting for at least 8 hours.   BUN 53 (H) 8 - 23 mg/dL   Creatinine, Ser 1.61 (H) 0.44 - 1.00 mg/dL   Calcium 9.2 8.9 - 09.6 mg/dL   GFR, Estimated 46 (L) >60 mL/min    Comment: (NOTE) Calculated using the CKD-EPI Creatinine Equation (2021)    Anion gap 10 5 - 15    Comment: Performed at  [facility], [address] [place], [place] [address]      Assessment & Plan:  Patient will continue all her medications as such.  Also to continue Desitin application to the buttocks. 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   Total time spent: 30 minutes  Margaretann Loveless, MD  02/17/2023

## 2023-03-28 ENCOUNTER — Other Ambulatory Visit: Payer: Medicare Other | Admitting: Urology

## 2023-04-14 ENCOUNTER — Other Ambulatory Visit: Payer: Self-pay

## 2023-04-14 MED ORDER — LISINOPRIL 2.5 MG PO TABS: 2.5000 mg | ORAL_TABLET | Freq: Every day | ORAL | 3 refills | Status: DC

## 2023-04-25 ENCOUNTER — Telehealth: Payer: Self-pay | Admitting: Internal Medicine

## 2023-04-25 NOTE — Telephone Encounter (Signed)
Katelyn from assisted living left VM asking for a call back regarding this patient.   Callback # [phone]

## 2023-05-02 ENCOUNTER — Telehealth: Payer: Self-pay

## 2023-05-02 ENCOUNTER — Ambulatory Visit (INDEPENDENT_AMBULATORY_CARE_PROVIDER_SITE_OTHER): Payer: Medicare Other | Admitting: Urology

## 2023-05-02 VITALS — BP 145/74 | HR 57 | Ht 63.0 in | Wt 172.0 lb

## 2023-05-02 DIAGNOSIS — R8290 Unspecified abnormal findings in urine: Secondary | ICD-10-CM | POA: Diagnosis not present

## 2023-05-02 DIAGNOSIS — N3946 Mixed incontinence: Secondary | ICD-10-CM

## 2023-05-02 LAB — URINALYSIS, COMPLETE
Bilirubin, UA: NEGATIVE
Glucose, UA: NEGATIVE
Ketones, UA: NEGATIVE
Nitrite, UA: POSITIVE — AB
Specific Gravity, UA: 1.015 (ref 1.005–1.030)
Urobilinogen, Ur: 0.2 mg/dL (ref 0.2–1.0)
pH, UA: 7 (ref 5.0–7.5)

## 2023-05-02 LAB — MICROSCOPIC EXAMINATION: WBC, UA: >30 /hpf — AB (ref 0–5)

## 2023-05-02 LAB — BLADDER SCAN AMB NON-IMAGING: Scan Result: 23

## 2023-05-02 MED ORDER — CIPROFLOXACIN HCL 250 MG PO TABS
250.0000 mg | ORAL_TABLET | Freq: Two times a day (BID) | ORAL | 0 refills | Status: DC
Start: 2023-05-02 — End: 2023-05-17

## 2023-05-02 MED ORDER — TRIMETHOPRIM 100 MG PO TABS
100.0000 mg | ORAL_TABLET | Freq: Every day | ORAL | 11 refills | Status: DC
Start: 2023-05-02 — End: 2023-05-02

## 2023-05-02 MED ORDER — CIPROFLOXACIN HCL 250 MG PO TABS
250.0000 mg | ORAL_TABLET | Freq: Two times a day (BID) | ORAL | 0 refills | Status: DC
Start: 2023-05-02 — End: 2023-05-02

## 2023-05-02 MED ORDER — TRIMETHOPRIM 100 MG PO TABS
100.0000 mg | ORAL_TABLET | Freq: Every day | ORAL | 11 refills | Status: DC
Start: 2023-05-02 — End: 2023-08-22

## 2023-05-02 NOTE — Progress Notes (Signed)
05/02/2023 11:08 AM   Yvonne Singleton Dec 14, 1934 [id number]  Referring provider: Margaretann Loveless, MD [address] [place],  [place] [address]  Chief Complaint  Patient presents with   Cysto    HPI: I was consulted to assess the patient's urinary incontinence.  Main symptom is urge incontinence but she also has moderately severe stress incontinence.  She has mild bedwetting.  She can soak 4-5 pads a day   She voids every 2-3 hours gets up once a night.   She has Parkinson's and is currently not responding to a combination of Detrol and Myrbetriq   She has had a hysterectomy low back surgery.  She uses a chair but can transfer to the toilet   No bladder surgery    Return for pelvic examination and cystoscopy and postvoid residual on Gemtesa samples.  Stop Detrol and Myrbetriq.  Call if culture positive.  She had nausea from trospium  Today Frequency stable.  Urine culture 2023 was negative.  PVR 23 mL.  Patient cannot recall if she took the samples.  Nurses said urine is strong smelling  Narrow introitus and no stress encounter prolapse with light cough  Cystoscopy: Patient underwent flexible cystoscopy.  She had cloudy urine with a lot of white flecks and sediment.  Mucosa within normal limits with no carcinoma.  Trigone normal.  Urine sent for culture      PMH: Past Medical History:  Diagnosis Date   Acute metabolic encephalopathy 05/12/2022   Back pain    Breast cancer (HCC) 2007   right breast lumpectomy with rad tx   Cancer [facility]) 2007   right breast   Collagen vascular disease (HCC)    Gout    Hard of hearing    Headache 05/16/2022   Hypertension    Parkinson's disease    Personal history of radiation therapy 2007   F/U right breast cancer   UTI (urinary tract infection) 05/12/2022    Surgical History: Past Surgical History:  Procedure Laterality Date   ABDOMINAL HYSTERECTOMY     ANTERIOR HIP REVISION Right 04/08/2022   Procedure: Anterior hip  revision cup and femoral head;  Surgeon: Kennedy Bucker, MD;  Location: [facility] ORS;  Service: Orthopedics;  Laterality: Right;   BREAST LUMPECTOMY Right 2007   suspicious calcs, f/u with radiation   CATARACT EXTRACTION Bilateral    ESOPHAGOGASTRODUODENOSCOPY (EGD) WITH PROPOFOL N/A 05/25/2017   Procedure: ESOPHAGOGASTRODUODENOSCOPY (EGD) WITH PROPOFOL;  Surgeon: Christena Deem, MD;  Location: [facility] ENDOSCOPY;  Service: Endoscopy;  Laterality: N/A;   ESOPHAGOGASTRODUODENOSCOPY (EGD) WITH PROPOFOL N/A 05/09/2018   Procedure: ESOPHAGOGASTRODUODENOSCOPY (EGD) WITH PROPOFOL;  Surgeon: Toney Reil, MD;  Location: [facility] ENDOSCOPY;  Service: Gastroenterology;  Laterality: N/A;   ESOPHAGOGASTRODUODENOSCOPY (EGD) WITH PROPOFOL N/A 12/09/2021   Procedure: ESOPHAGOGASTRODUODENOSCOPY (EGD) WITH PROPOFOL;  Surgeon: Toney Reil, MD;  Location: [facility] ENDOSCOPY;  Service: Gastroenterology;  Laterality: N/A;   EYE SURGERY Bilateral    HAND SURGERY     HIP CLOSED REDUCTION Left 06/24/2015   Procedure: CLOSED REDUCTION HIP;  Surgeon: Donato Heinz, MD;  Location: [facility] ORS;  Service: Orthopedics;  Laterality: Left;   HIP CLOSED REDUCTION Left 02/09/2016   Procedure: CLOSED MANIPULATION HIP;  Surgeon: Deeann Saint, MD;  Location: [facility] ORS;  Service: Orthopedics;  Laterality: Left;   HIP CLOSED REDUCTION Left 10/31/2021   Procedure: CLOSED REDUCTION HIP;  Surgeon: Christena Flake, MD;  Location: [facility] ORS;  Service: Orthopedics;  Laterality: Left;   HIP CLOSED REDUCTION  Left 07/05/2022   Procedure: CLOSED REDUCTION HIP;  Surgeon: Donato Heinz, MD;  Location: [facility] ORS;  Service: Orthopedics;  Laterality: Left;   HIP CLOSED REDUCTION Left 01/17/2023   Procedure: CLOSED REDUCTION HIP;  Surgeon: Reinaldo Berber, MD;  Location: [facility] ORS;  Service: Orthopedics;  Laterality: Left;   HIP SURGERY     JOINT REPLACEMENT     bilateral hip   OOPHORECTOMY     TOTAL KNEE ARTHROPLASTY Bilateral     Home Medications:   Allergies as of 05/02/2023       Reactions   Streptomycin Hives   Digoxin Other (See Comments)   Other reaction(s): Unknown Note: pt had lethargy, felt bad Note: pt had lethargy, felt bad Other reaction(s): Unknown Note: pt had lethargy, felt bad Other reaction(s): Unknown Note: pt had lethargy, felt bad Note: pt had lethargy, felt bad   Trospium Nausea And Vomiting   Iodinated Contrast Media Hives        Medication List        Accurate as of May 02, 2023 11:08 AM. If you have any questions, ask your nurse or doctor.          amLODipine 10 MG tablet Commonly known as: NORVASC Take 1 tablet (10 mg total) by mouth daily.   b complex vitamins tablet Take 1 tablet by mouth daily.   Calcium Carbonate-Vitamin D 600-200 MG-UNIT Tabs Take 1 tablet by mouth 2 (two) times daily.   carbidopa-levodopa 50-200 MG tablet Commonly known as: SINEMET CR Take 1 tablet by mouth 2 (two) times daily.   cetirizine 5 MG tablet Commonly known as: ZYRTEC 5 mg DAILY (route: oral)   cholecalciferol 1000 units tablet Commonly known as: VITAMIN D Take 1,000 Units by mouth daily.   gabapentin 300 MG capsule Commonly known as: NEURONTIN Take 1 capsule (300 mg total) by mouth 2 (two) times daily.   latanoprost 0.005 % ophthalmic solution Commonly known as: XALATAN Place 1 drop into both eyes every evening.   lisinopril 2.5 MG tablet Commonly known as: ZESTRIL Take 1 tablet (2.5 mg total) by mouth daily.   liver oil-zinc oxide 40 % ointment Commonly known as: DESITIN Apply 1 Application topically as needed for irritation.   oxybutynin 10 MG 24 hr tablet Commonly known as: DITROPAN-XL Take 1 tablet (10 mg total) by mouth daily.   oxyCODONE 5 MG immediate release tablet Commonly known as: Oxy IR/ROXICODONE Take 1 tablet (5 mg total) by mouth every 6 (six) hours as needed for severe pain or moderate pain.   pantoprazole 40 MG tablet Commonly known as: PROTONIX Take 1 tablet  (40 mg total) by mouth 2 (two) times daily before a meal.   sertraline 100 MG tablet Commonly known as: ZOLOFT Take 100 mg by mouth daily.   Systane 0.4-0.3 % Soln Generic drug: Polyethyl Glycol-Propyl Glycol Place 1 drop into both eyes daily.        Allergies:  Allergies  Allergen Reactions   Streptomycin Hives   Digoxin Other (See Comments)    Other reaction(s): Unknown Note: pt had lethargy, felt bad Note: pt had lethargy, felt bad  Other reaction(s): Unknown Note: pt had lethargy, felt bad  Other reaction(s): Unknown Note: pt had lethargy, felt bad Note: pt had lethargy, felt bad   Trospium Nausea And Vomiting   Iodinated Contrast Media Hives    Family History: Family History  Problem Relation Age of Onset   Heart disease Mother    Arthritis Mother  Heart disease Father    Ulcers Father    Breast cancer Maternal Aunt     Social History:  reports that she has never smoked. She has never been exposed to tobacco smoke. She has never used smokeless tobacco. She reports that she does not drink alcohol and does not use drugs.  ROS:                                        Physical Exam: There were no vitals taken for this visit.  Constitutional:  Alert and oriented, No acute distress. HEENT: [place] AT, moist mucus membranes.  Trachea midline, no masses.  Laboratory Data: Lab Results  Component Value Date   WBC 8.2 01/17/2023   HGB 13.1 01/17/2023   HCT 41.3 01/17/2023   MCV 99.3 01/17/2023   PLT 261 01/17/2023    Lab Results  Component Value Date   CREATININE 1.17 (H) 01/17/2023    No results found for: "PSA"  No results found for: "TESTOSTERONE"  No results found for: "HGBA1C"  Urinalysis    Component Value Date/Time   COLORURINE STRAW (A) 06/29/2022 1941   APPEARANCEUR Hazy (A) 12/06/2022 1433   LABSPEC 1.005 06/29/2022 1941   LABSPEC 1.006 09/30/2014 1508   PHURINE 5.0 06/29/2022 1941   GLUCOSEU Negative 12/06/2022  1433   GLUCOSEU Negative 09/30/2014 1508   HGBUR MODERATE (A) 06/29/2022 1941   BILIRUBINUR Negative 12/06/2022 1433   BILIRUBINUR Negative 09/30/2014 1508   KETONESUR NEGATIVE 06/29/2022 1941   PROTEINUR Negative 12/06/2022 1433   PROTEINUR 100 (A) 06/29/2022 1941   NITRITE Negative 12/06/2022 1433   NITRITE NEGATIVE 06/29/2022 1941   LEUKOCYTESUR 2+ (A) 12/06/2022 1433   LEUKOCYTESUR NEGATIVE 06/29/2022 1941   LEUKOCYTESUR 2+ 09/30/2014 1508    Pertinent Imaging: Urine reviewed and sent for culture  Assessment & Plan: Patient may be having ongoing bladder infections.  I thought at this stage was best to call in ciprofloxacin 250 mg twice a day for 7 days.  I will then have her come back on trimethoprim 100 mg 3 x 11.  I will then reintroduce Gemtesa samples.  Cystoscopy findings may be why she failed the other medications noted.  Percutaneous tibial nerve stimulation option.  I think we need to have reasonable treatment goals  1. Mixed incontinence  - Urinalysis, Complete   No follow-ups on file.  Martina Sinner, MD  [facility] [address], [address] [place], [place] [address] [phone]

## 2023-05-02 NOTE — Telephone Encounter (Signed)
Orders faxed and asked Sam to co sign as Dr Sherron Monday left the office already. Medications re sent.

## 2023-05-02 NOTE — Telephone Encounter (Signed)
Patient coordinator from [facility] nursing [facility] calls triage line and states that the patient's order form was not filled out for the medication that Dr. Sherron Monday wants her to start. It was also sent into the wrong pharmacy. The orders need to be faxed to them at [phone] and the medication needs to be e-scribed to [facility].

## 2023-05-06 LAB — CULTURE, URINE COMPREHENSIVE

## 2023-05-16 ENCOUNTER — Emergency Department: Payer: Medicare Other

## 2023-05-16 ENCOUNTER — Encounter: Payer: Self-pay | Admitting: Internal Medicine

## 2023-05-16 ENCOUNTER — Other Ambulatory Visit: Payer: Self-pay

## 2023-05-16 ENCOUNTER — Observation Stay
Admission: EM | Admit: 2023-05-16 | Discharge: 2023-05-17 | Disposition: A | Discharge status: Home / Self Care | Payer: Medicare Other | Attending: Emergency Medicine | Admitting: Emergency Medicine

## 2023-05-16 DIAGNOSIS — Y828 Other medical devices associated with adverse incidents: Secondary | ICD-10-CM | POA: Insufficient documentation

## 2023-05-16 DIAGNOSIS — F32A Depression, unspecified: Secondary | ICD-10-CM | POA: Diagnosis present

## 2023-05-16 DIAGNOSIS — I10 Essential (primary) hypertension: Secondary | ICD-10-CM | POA: Diagnosis present

## 2023-05-16 DIAGNOSIS — Z96643 Presence of artificial hip joint, bilateral: Secondary | ICD-10-CM | POA: Diagnosis not present

## 2023-05-16 DIAGNOSIS — S73005A Unspecified dislocation of left hip, initial encounter: Secondary | ICD-10-CM | POA: Insufficient documentation

## 2023-05-16 DIAGNOSIS — Z853 Personal history of malignant neoplasm of breast: Secondary | ICD-10-CM | POA: Diagnosis not present

## 2023-05-16 DIAGNOSIS — G20C Parkinsonism, unspecified: Secondary | ICD-10-CM | POA: Insufficient documentation

## 2023-05-16 DIAGNOSIS — Z96653 Presence of artificial knee joint, bilateral: Secondary | ICD-10-CM | POA: Insufficient documentation

## 2023-05-16 DIAGNOSIS — Z79899 Other long term (current) drug therapy: Secondary | ICD-10-CM | POA: Diagnosis not present

## 2023-05-16 DIAGNOSIS — T84021A Dislocation of internal left hip prosthesis, initial encounter: Secondary | ICD-10-CM | POA: Diagnosis not present

## 2023-05-16 DIAGNOSIS — N1831 Chronic kidney disease, stage 3a: Secondary | ICD-10-CM | POA: Diagnosis not present

## 2023-05-16 DIAGNOSIS — G20A1 Parkinson's disease without dyskinesia, without mention of fluctuations: Secondary | ICD-10-CM | POA: Diagnosis present

## 2023-05-16 DIAGNOSIS — I129 Hypertensive chronic kidney disease with stage 1 through stage 4 chronic kidney disease, or unspecified chronic kidney disease: Secondary | ICD-10-CM | POA: Diagnosis not present

## 2023-05-16 DIAGNOSIS — E875 Hyperkalemia: Secondary | ICD-10-CM | POA: Diagnosis present

## 2023-05-16 DIAGNOSIS — M25552 Pain in left hip: Secondary | ICD-10-CM | POA: Diagnosis present

## 2023-05-16 LAB — CBC WITH DIFFERENTIAL/PLATELET
Abs Immature Granulocytes: 0.07 10*3/uL (ref 0.00–0.07)
Basophils Absolute: 0 10*3/uL (ref 0.0–0.1)
Basophils Relative: 1 %
Eosinophils Absolute: 0.3 10*3/uL (ref 0.0–0.5)
Eosinophils Relative: 3 %
HCT: 43.1 % (ref 36.0–46.0)
Hemoglobin: 13.7 g/dL (ref 12.0–15.0)
Immature Granulocytes: 1 %
Lymphocytes Relative: 24 %
Lymphs Abs: 2 10*3/uL (ref 0.7–4.0)
MCH: 30.7 pg (ref 26.0–34.0)
MCHC: 31.8 g/dL (ref 30.0–36.0)
MCV: 96.6 fL (ref 80.0–100.0)
Monocytes Absolute: 0.5 10*3/uL (ref 0.1–1.0)
Monocytes Relative: 6 %
Neutro Abs: 5.5 10*3/uL (ref 1.7–7.7)
Neutrophils Relative %: 65 %
Platelets: 252 10*3/uL (ref 150–400)
RBC: 4.46 MIL/uL (ref 3.87–5.11)
RDW: 14.6 % (ref 11.5–15.5)
WBC: 8.3 10*3/uL (ref 4.0–10.5)
nRBC: 0 % (ref 0.0–0.2)

## 2023-05-16 LAB — BASIC METABOLIC PANEL
Anion gap: 10 (ref 5–15)
BUN: 48 mg/dL — ABNORMAL HIGH (ref 8–23)
CO2: 24 mmol/L (ref 22–32)
Calcium: 9.1 mg/dL (ref 8.9–10.3)
Chloride: 105 mmol/L (ref 98–111)
Creatinine, Ser: 1.3 mg/dL — ABNORMAL HIGH (ref 0.44–1.00)
GFR, Estimated: 40 mL/min — ABNORMAL LOW (ref >60)
Glucose, Bld: 95 mg/dL (ref 70–99)
Potassium: 5.3 mmol/L — ABNORMAL HIGH (ref 3.5–5.1)
Sodium: 139 mmol/L (ref 135–145)

## 2023-05-16 LAB — MRSA NEXT GEN BY PCR, NASAL: MRSA by PCR Next Gen: NOT DETECTED

## 2023-05-16 LAB — PROTIME-INR
INR: 1.1 (ref 0.8–1.2)
Prothrombin Time: 14.3 seconds (ref 11.4–15.2)

## 2023-05-16 LAB — APTT: aPTT: 27 seconds (ref 24–36)

## 2023-05-16 MED ORDER — ZINC OXIDE 40 % EX OINT: 1.0000 | TOPICAL_OINTMENT | CUTANEOUS | Status: DC | PRN

## 2023-05-16 MED ORDER — PATIROMER SORBITEX CALCIUM 8.4 G PO PACK
8.4000 g | PACK | Freq: Once | ORAL | Status: AC
  Administered 2023-05-16: 8.4 g via ORAL
  Filled 2023-05-16: qty 1

## 2023-05-16 MED ORDER — LIDOCAINE 5 % EX PTCH
1.0000 | MEDICATED_PATCH | CUTANEOUS | Status: DC
  Administered 2023-05-16: 1 via TRANSDERMAL
  Filled 2023-05-16: qty 1

## 2023-05-16 MED ORDER — SERTRALINE HCL 50 MG PO TABS: 100.0000 mg | ORAL_TABLET | Freq: Every day | ORAL | Status: DC

## 2023-05-16 MED ORDER — LISINOPRIL 5 MG PO TABS: 2.5000 mg | ORAL_TABLET | Freq: Every day | ORAL | Status: DC

## 2023-05-16 MED ORDER — LATANOPROST 0.005 % OP SOLN
1.0000 [drp] | Freq: Every evening | OPHTHALMIC | Status: DC
  Administered 2023-05-16: 1 [drp] via OPHTHALMIC
  Filled 2023-05-16 (×2): qty 2.5

## 2023-05-16 MED ORDER — SODIUM CHLORIDE 0.9 % IV SOLN
INTRAVENOUS | Status: AC | PRN
  Administered 2023-05-16: 100 mL/h via INTRAVENOUS

## 2023-05-16 MED ORDER — CARBIDOPA-LEVODOPA ER 50-200 MG PO TBCR
1.0000 | EXTENDED_RELEASE_TABLET | Freq: Three times a day (TID) | ORAL | Status: DC
  Administered 2023-05-16 (×2): 1 via ORAL
  Filled 2023-05-16 (×3): qty 1

## 2023-05-16 MED ORDER — OXYCODONE-ACETAMINOPHEN 5-325 MG PO TABS
1.0000 | ORAL_TABLET | ORAL | Status: DC | PRN
  Administered 2023-05-16 (×2): 1 via ORAL
  Filled 2023-05-16 (×2): qty 1

## 2023-05-16 MED ORDER — VITAMIN D 25 MCG (1000 UNIT) PO TABS: 1000.0000 [IU] | ORAL_TABLET | Freq: Every day | ORAL | Status: DC

## 2023-05-16 MED ORDER — OXYBUTYNIN CHLORIDE ER 10 MG PO TB24: 10.0000 mg | ORAL_TABLET | Freq: Every day | ORAL | Status: DC

## 2023-05-16 MED ORDER — GABAPENTIN 300 MG PO CAPS
300.0000 mg | ORAL_CAPSULE | Freq: Two times a day (BID) | ORAL | Status: DC
  Administered 2023-05-16: 300 mg via ORAL
  Filled 2023-05-16: qty 1

## 2023-05-16 MED ORDER — PANTOPRAZOLE SODIUM 40 MG PO TBEC
40.0000 mg | DELAYED_RELEASE_TABLET | Freq: Two times a day (BID) | ORAL | Status: DC
  Administered 2023-05-16: 40 mg via ORAL
  Filled 2023-05-16: qty 1

## 2023-05-16 MED ORDER — PROPOFOL 10 MG/ML IV BOLUS
INTRAVENOUS | Status: AC | PRN
  Administered 2023-05-16: 84.4 mg via INTRAVENOUS

## 2023-05-16 MED ORDER — ACETAMINOPHEN 325 MG PO TABS: 650.0000 mg | ORAL_TABLET | Freq: Four times a day (QID) | ORAL | Status: DC | PRN

## 2023-05-16 MED ORDER — CALCIUM CARBONATE-VITAMIN D 600-200 MG-UNIT PO TABS: 1.0000 | ORAL_TABLET | Freq: Two times a day (BID) | ORAL | Status: DC

## 2023-05-16 MED ORDER — ONDANSETRON HCL 4 MG/2ML IJ SOLN: 4.0000 mg | Freq: Three times a day (TID) | INTRAMUSCULAR | Status: DC | PRN

## 2023-05-16 MED ORDER — MORPHINE SULFATE (PF) 4 MG/ML IV SOLN
4.0000 mg | Freq: Once | INTRAVENOUS | Status: AC
  Administered 2023-05-16: 4 mg via INTRAVENOUS
  Filled 2023-05-16: qty 1

## 2023-05-16 MED ORDER — MORPHINE SULFATE (PF) 2 MG/ML IV SOLN
1.0000 mg | INTRAVENOUS | Status: DC | PRN
  Administered 2023-05-17: 1 mg via INTRAVENOUS
  Filled 2023-05-16: qty 1

## 2023-05-16 MED ORDER — METHOCARBAMOL 500 MG PO TABS
500.0000 mg | ORAL_TABLET | Freq: Three times a day (TID) | ORAL | Status: DC | PRN
  Administered 2023-05-16: 500 mg via ORAL
  Filled 2023-05-16 (×3): qty 1

## 2023-05-16 MED ORDER — OYSTER SHELL CALCIUM/D3 500-5 MG-MCG PO TABS
1.0000 | ORAL_TABLET | Freq: Two times a day (BID) | ORAL | Status: DC
  Administered 2023-05-16: 1 via ORAL
  Filled 2023-05-16: qty 1

## 2023-05-16 MED ORDER — PROPOFOL 10 MG/ML IV BOLUS
1.0000 mg/kg | Freq: Once | INTRAVENOUS | Status: DC
  Filled 2023-05-16: qty 20

## 2023-05-16 NOTE — ED Notes (Signed)
Patient transported to X-ray 

## 2023-05-16 NOTE — ED Triage Notes (Signed)
Pt arrives via [facility] from [facility]. Pt c/o L hip pain after twisting wrong while sitting. Pt has hx of hip dislocation. L leg is shorter with internal rotation. Pt received oxycodone at facility and fentanyl from EMS en route.

## 2023-05-16 NOTE — Consult Note (Signed)
ORTHOPAEDIC CONSULTATION  REQUESTING PHYSICIAN: Lorretta Harp, MD  Chief Complaint:   Left hip pain.  History of Present Illness: Yvonne Singleton is a 88 y.o. female with multiple medical problems, including acute metabolic encephalopathy, breast cancer, gout, hypertension, Parkinson's disease, and collagen vascular disease who is status post a left total hip arthroplasty over 10 years ago.  The patient has had numerous recurrent dislocations over the years, the most recent of which occurred in April, 2024.  Each time, these have been successfully reduced in the operating room.  Apparently, the patient was in her usual state of health today when, while sitting in her wheelchair, she leaned to her right and felt a sudden onset of pain.  She presented to the emergency room where x-rays demonstrated a posterior dislocation of the left hip.  The ER provider attempted a closed reduction under IV sedation which was unsuccessful.  Therefore, the patient is being admitted in preparation for close reduction of the hip under general anesthesia.  The patient denies any associated injuries.  She did not fall, strike her head, or lose consciousness.  She also denies any associated symptoms such as lightheadedness, dizziness, chest pain, or other symptoms which might have precipitated her injury.  Past Medical History:  Diagnosis Date   Acute metabolic encephalopathy 05/12/2022   Back pain    Breast cancer [facility]) 2007   right breast lumpectomy with rad tx   Cancer [facility]) 2007   right breast   Collagen vascular disease (HCC)    Gout    Hard of hearing    Headache 05/16/2022   Hypertension    Parkinson's disease    Personal history of radiation therapy 2007   F/U right breast cancer   UTI (urinary tract infection) 05/12/2022   Past Surgical History:  Procedure Laterality Date   ABDOMINAL HYSTERECTOMY     ANTERIOR HIP REVISION Right  04/08/2022   Procedure: Anterior hip revision cup and femoral head;  Surgeon: Kennedy Bucker, MD;  Location: [facility] ORS;  Service: Orthopedics;  Laterality: Right;   BREAST LUMPECTOMY Right 2007   suspicious calcs, f/u with radiation   CATARACT EXTRACTION Bilateral    ESOPHAGOGASTRODUODENOSCOPY (EGD) WITH PROPOFOL N/A 05/25/2017   Procedure: ESOPHAGOGASTRODUODENOSCOPY (EGD) WITH PROPOFOL;  Surgeon: Christena Deem, MD;  Location: [facility] ENDOSCOPY;  Service: Endoscopy;  Laterality: N/A;   ESOPHAGOGASTRODUODENOSCOPY (EGD) WITH PROPOFOL N/A 05/09/2018   Procedure: ESOPHAGOGASTRODUODENOSCOPY (EGD) WITH PROPOFOL;  Surgeon: Toney Reil, MD;  Location: [facility] ENDOSCOPY;  Service: Gastroenterology;  Laterality: N/A;   ESOPHAGOGASTRODUODENOSCOPY (EGD) WITH PROPOFOL N/A 12/09/2021   Procedure: ESOPHAGOGASTRODUODENOSCOPY (EGD) WITH PROPOFOL;  Surgeon: Toney Reil, MD;  Location: [facility] ENDOSCOPY;  Service: Gastroenterology;  Laterality: N/A;   EYE SURGERY Bilateral    HAND SURGERY     HIP CLOSED REDUCTION Left 06/24/2015   Procedure: CLOSED REDUCTION HIP;  Surgeon: Donato Heinz, MD;  Location: [facility] ORS;  Service: Orthopedics;  Laterality: Left;   HIP CLOSED REDUCTION Left 02/09/2016   Procedure: CLOSED MANIPULATION HIP;  Surgeon: Deeann Saint, MD;  Location: [facility] ORS;  Service: Orthopedics;  Laterality: Left;   HIP CLOSED REDUCTION Left 10/31/2021   Procedure: CLOSED REDUCTION HIP;  Surgeon: Christena Flake, MD;  Location: [facility] ORS;  Service: Orthopedics;  Laterality: Left;   HIP CLOSED REDUCTION Left 07/05/2022   Procedure: CLOSED REDUCTION HIP;  Surgeon: Donato Heinz, MD;  Location: [facility] ORS;  Service: Orthopedics;  Laterality: Left;   HIP CLOSED REDUCTION Left 01/17/2023   Procedure:  CLOSED REDUCTION HIP;  Surgeon: Reinaldo Berber, MD;  Location: [facility] ORS;  Service: Orthopedics;  Laterality: Left;   HIP SURGERY     JOINT REPLACEMENT     bilateral hip   OOPHORECTOMY     TOTAL KNEE ARTHROPLASTY  Bilateral    Social History   Socioeconomic History   Marital status: Widowed    Spouse name: Not on file   Number of children: Not on file   Years of education: Not on file   Highest education level: Not on file  Occupational History   Not on file  Tobacco Use   Smoking status: Never    Passive exposure: Never   Smokeless tobacco: Never  Substance and Sexual Activity   Alcohol use: No    Alcohol/week: 0.0 standard drinks of alcohol   Drug use: No   Sexual activity: Not Currently  Other Topics Concern   Not on file  Social History Narrative   Not on file   Social Determinants of Health   Financial Resource Strain: Not on file  Food Insecurity: No Food Insecurity (06/29/2022)   Hunger Vital Sign    Worried About Running Out of Food in the Last Year: Never true    Ran Out of Food in the Last Year: Never true  Transportation Needs: No Transportation Needs (06/29/2022)   PRAPARE - Administrator, Civil Service (Medical): No    Lack of Transportation (Non-Medical): No  Physical Activity: Not on file  Stress: Not on file  Social Connections: Not on file   Family History  Problem Relation Age of Onset   Heart disease Mother    Arthritis Mother    Heart disease Father    Ulcers Father    Breast cancer Maternal Aunt    Allergies  Allergen Reactions   Streptomycin Hives   Digoxin Other (See Comments)    Other reaction(s): Unknown Note: pt had lethargy, felt bad Note: pt had lethargy, felt bad  Other reaction(s): Unknown Note: pt had lethargy, felt bad  Other reaction(s): Unknown Note: pt had lethargy, felt bad Note: pt had lethargy, felt bad   Trospium Nausea And Vomiting   Iodinated Contrast Media Hives   Prior to Admission medications   Medication Sig Start Date End Date Taking? Authorizing Provider  Calcium Carbonate-Vitamin D 600-200 MG-UNIT TABS Take 1 tablet by mouth 2 (two) times daily.   Yes [provider]  carbidopa-levodopa  (SINEMET CR) 50-200 MG per tablet Take 1 tablet by mouth 3 (three) times daily.   Yes [provider]  cetirizine (ZYRTEC) 5 MG tablet 5 mg DAILY (route: oral) 05/26/22  Yes [provider]  cholecalciferol (VITAMIN D) 1000 units tablet Take 1,000 Units by mouth daily.   Yes [provider]  furosemide (LASIX) 20 MG tablet Take 20 mg by mouth daily.   Yes [provider]  gabapentin (NEURONTIN) 300 MG capsule Take 1 capsule (300 mg total) by mouth 2 (two) times daily. 04/12/22  Yes Lynn Ito, MD  latanoprost (XALATAN) 0.005 % ophthalmic solution Place 1 drop into both eyes every evening. 02/19/22  Yes [provider]  lisinopril (ZESTRIL) 2.5 MG tablet Take 1 tablet (2.5 mg total) by mouth daily. 04/14/23  Yes Margaretann Loveless, MD  liver oil-zinc oxide (DESITIN) 40 % ointment Apply 1 Application topically as needed for irritation. 01/30/23  Yes Miki Kins, FNP  oxybutynin (DITROPAN-XL) 10 MG 24 hr tablet Take 1 tablet (10 mg total) by mouth  daily. 01/03/23  Yes MacDiarmid, Lorin Picket, MD  oxyCODONE (OXY IR/ROXICODONE) 5 MG immediate release tablet Take 1 tablet (5 mg total) by mouth every 6 (six) hours as needed for severe pain or moderate pain. 07/01/22  Yes Amin, Loura Halt, MD  pantoprazole (PROTONIX) 40 MG tablet Take 1 tablet (40 mg total) by mouth 2 (two) times daily before a meal. 12/09/21 05/16/23 Yes Vanga, Loel Dubonnet, MD  Polyethyl Glycol-Propyl Glycol (SYSTANE) 0.4-0.3 % SOLN Place 1 drop into both eyes daily. 05/26/22  Yes [provider]  sertraline (ZOLOFT) 100 MG tablet Take 100 mg by mouth daily. 09/30/21  Yes [provider]  trimethoprim (TRIMPEX) 100 MG tablet Take 1 tablet (100 mg total) by mouth daily. 05/02/23  Yes MacDiarmid, Lorin Picket, MD  amLODipine (NORVASC) 10 MG tablet Take 1 tablet (10 mg total) by mouth daily. Patient not taking: Reported on 05/16/2023 05/18/22   Esaw Grandchild A, DO  b complex vitamins tablet Take 1  tablet by mouth daily. Patient not taking: Reported on 05/16/2023    [provider]  ciprofloxacin (CIPRO) 250 MG tablet Take 1 tablet (250 mg total) by mouth 2 (two) times daily. Patient not taking: Reported on 05/16/2023 05/02/23   Alfredo Martinez, MD   DG FEMUR MIN 2 VIEWS LEFT  Result Date: 05/16/2023 CLINICAL DATA:  [id number] Hip dislocation, left, initial encounter [facility]) [id number] EXAM: LEFT FEMUR 2 VIEWS COMPARISON:  Earlier the same day at 12:52 p.m. FINDINGS: There is diffuse osteopenia of the visualized osseous structures. Redemonstration of left hip dislocation with proximal migration of the femoral component in relation to the acetabular component. No acute fracture. No aggressive osseous lesion. Note is made of left knee arthroplasty. IMPRESSION: Redemonstration of left hip dislocation. Electronically Signed   By: Jules Schick M.D.   On: 05/16/2023 16:54   DG Hip Unilat W or Wo Pelvis 2-3 Views Left  Result Date: 05/16/2023 CLINICAL DATA:  Left hip pain.  History of dislocation. EXAM: DG HIP (WITH OR WITHOUT PELVIS) 2-3V LEFT COMPARISON:  Left hip radiographic series-01/17/2023 FINDINGS: Recurrent left total hip dislocation with foreshortening. No definite associated fracture. Expected adjacent soft tissue swelling. No radiopaque foreign body. Limited visualization of the pelvis and contralateral right total hip replacement prosthesis is normal without evidence of hardware failure or loosening within the imaged components. [place]icle adjacent to the right lesser trochanter is unchanged. Degenerative change of the lower lumbar spine is suspected though incompletely evaluated. IMPRESSION: Recurrent left total hip dislocation with foreshortening. No definite associated fracture. Electronically Signed   By: Simonne Come M.D.   On: 05/16/2023 14:00    Positive ROS: All other systems have been reviewed and were otherwise negative with the exception of those mentioned in the HPI and as  above.  Physical Exam: General:  Alert, no acute distress Psychiatric:  Patient is competent for consent with normal mood and affect   Cardiovascular:  No pedal edema Respiratory:  No wheezing, non-labored breathing GI:  Abdomen is soft and non-tender Skin:  No lesions in the area of chief complaint Neurologic:  Sensation intact distally Lymphatic:  No axillary or cervical lymphadenopathy  Orthopedic Exam:  Orthopedic examination is limited to the left hip and lower extremity.  The left lower extremity is somewhat shortened and internally rotated as compared to the right.  Skin inspection around the right hip is notable for a well-healed surgical incision, but otherwise is unremarkable.  No obvious swelling, erythema, ecchymosis, lesions, or other skin abnormalities are identified.  She has moderate pain with any attempted active or passive motion of the hip.  She is grossly neurovascularly intact to the left lower extremity and foot.  X-rays:  Recent x-rays of the pelvis and left hip are available for review and have been reviewed by myself.  These films demonstrate a posterior superior dislocation of the left hip prosthesis.  Both the femoral and acetabular components appear to be positioned satisfactorily and without evidence of loosening.  Assessment: Recurrent left prosthetic hip dislocation.  Plan: The treatment options have been discussed with the patient and her daughter, who is at the bedside.  Both the patient and her daughter would like to proceed with surgical intervention to include a closed reduction of the prosthetic left hip dislocation.  The risks (including bleeding, infection, nerve and/or blood vessel injury, persistent or recurrent pain, loosening or failure of the components, leg length inequality, recurrent dislocation, need for further surgery, blood clots, strokes, heart attacks or arrhythmias, pneumonia, etc.) and benefits of the surgical procedure were discussed.  The  patient and her daughter state their understanding and agree to proceed.  A formal written consent will be obtained by the nursing staff.  Thank you for asking me to participate in the care of this most pleasant and unfortunate woman.  I will be happy to follow her with you.   Maryagnes Amos, MD  Beeper #:  [id number]  05/16/2023 6:05 PM

## 2023-05-16 NOTE — ED Provider Notes (Addendum)
[facility] Provider Note    Event Date/Time   First MD Initiated Contact with Patient 05/16/23 1237     (approximate)   History   Hip Pain   HPI  Yvonne Singleton is a 88 y.o. female with a past history of bilateral total hip prosthesis and recurrent dislocations of left hip prosthesis who comes ED complaining of left hip pain and impaired range of motion of the left hip.  Started suddenly while she was sitting in her chair after turning a little bit to her side.  Unremitting, unable to bear weight.  No falls or trauma.  No fever.     Physical Exam   Triage Vital Signs: ED Triage Vitals  Encounter Vitals Group     BP 05/16/23 1243 (!) 141/111     Systolic BP Percentile --      Diastolic BP Percentile --      Pulse Rate 05/16/23 1243 62     Resp 05/16/23 1243 19     Temp --      Temp src --      SpO2 05/16/23 1243 98 %     Weight 05/16/23 1242 186 lb (84.4 kg)     Height 05/16/23 1242 5\' 3"  (1.6 m)     Head Circumference --      Peak Flow --      Pain Score 05/16/23 1240 10     Pain Loc --      Pain Education --      Exclude from Growth Chart --     Most recent vital signs: Vitals:   05/16/23 1440 05/16/23 1445  BP: (!) 113/57 (!) 138/59  Pulse: (!) 53 (!) 58  Resp: 18 15  Temp:    SpO2: 100% 100%    General: Awake, no distress.  CV:  Good peripheral perfusion.  Normal distal pulses Resp:  Normal effort.  Abd:  No distention.  Other:  Left hip shortened and internally rotated with very limited range of motion.   ED Results / Procedures / Treatments   Labs (all labs ordered are listed, but only abnormal results are displayed) Labs Reviewed  BASIC METABOLIC PANEL - Abnormal; Notable for the following components:      Result Value   Potassium 5.3 (*)    BUN 48 (*)    Creatinine, Ser 1.30 (*)    GFR, Estimated 40 (*)    All other components within normal limits  CBC WITH DIFFERENTIAL/PLATELET     RADIOLOGY X-ray  left hip interpreted by me, shows dislocated left hip prosthesis.  No fracture.  Radiology report reviewed   PROCEDURES:  .Ortho Injury Treatment  Date/Time: 05/16/2023 2:43 PM  Performed by: Sharman Cheek, MD Authorized by: Sharman Cheek, MD   Consent:    Consent obtained:  Written   Consent given by:  Patient   Risks discussed:  Fracture and irreducible dislocation   Alternatives discussed:  No treatmentInjury location: hip Location details: left hip Injury type: dislocation Dislocation type: posterior Spontaneous dislocation: yes Prosthesis: yes Pre-procedure neurovascular assessment: neurovascularly intact Pre-procedure distal perfusion: normal Pre-procedure neurological function: normal Pre-procedure range of motion: reduced  Anesthesia: Local anesthesia used: no  Patient sedated: Yes. Refer to sedation procedure documentation for details of sedation. Manipulation performed: yes Reduction method: extension, external rotation, traction and counter traction, abduction and [place] method Reduction successful: no Post-procedure neurovascular assessment: post-procedure neurovascularly intact Post-procedure distal perfusion: normal Post-procedure neurological function: normal Post-procedure range of motion:  unchanged Comments:      .Sedation  Date/Time: 05/16/2023 2:45 PM  Performed by: Sharman Cheek, MD Authorized by: Sharman Cheek, MD   Consent:    Consent obtained:  Written   Consent given by:  Patient   Risks discussed:  Inadequate sedation, respiratory compromise necessitating ventilatory assistance and intubation, vomiting and prolonged hypoxia resulting in organ damage   Alternatives discussed:  Analgesia without sedation Universal protocol:    Immediately prior to procedure, a time out was called: yes   Indications:    Procedure performed:  Dislocation reduction   Procedure necessitating sedation performed by:  Physician performing  sedation Pre-sedation assessment:    Time since last food or drink:  6 hours   NPO status caution: urgency dictates proceeding with non-ideal NPO status     ASA classification: class 2 - patient with mild systemic disease     Mouth opening:  3 or more finger widths   Thyromental distance:  4 finger widths   Mallampati score:  I - soft palate, uvula, fauces, pillars visible   Neck mobility: normal     Pre-sedation assessments completed and reviewed: airway patency, cardiovascular function, hydration status, mental status, nausea/vomiting, pain level and respiratory function     Pre-sedation assessment completed:  05/16/2023 2:00 PM Immediate pre-procedure details:    Reassessment: Patient reassessed immediately prior to procedure     Reviewed: vital signs, relevant labs/tests and NPO status     Verified: bag valve mask available, emergency equipment available, intubation equipment available, IV patency confirmed, oxygen available and suction available   Procedure details (see MAR for exact dosages):    Preoxygenation:  Nasal cannula   Sedation:  Propofol   Intended level of sedation: deep   Analgesia:  Morphine   Intra-procedure monitoring:  Blood pressure monitoring, cardiac monitor, continuous pulse oximetry, continuous capnometry, frequent LOC assessments and frequent vital sign checks   Intra-procedure events: none     Total Provider sedation time (minutes):  20 Post-procedure details:    Post-sedation assessment completed:  05/16/2023 2:46 PM   Attendance: Constant attendance by certified staff until patient recovered     Recovery: Patient returned to pre-procedure baseline     Post-sedation assessments completed and reviewed: airway patency, cardiovascular function, hydration status, mental status, nausea/vomiting, pain level and respiratory function     Patient is stable for discharge or admission: yes     Procedure completion:  Tolerated well, no immediate  complications    MEDICATIONS ORDERED IN ED: Medications  propofol (DIPRIVAN) 10 mg/mL bolus/IV push 84.4 mg (has no administration in time range)  morphine (PF) 4 MG/ML injection 4 mg (4 mg Intravenous Given 05/16/23 1413)  0.9 %  sodium chloride infusion (100 mL/hr Intravenous New Bag/Given 05/16/23 1424)  propofol (DIPRIVAN) 10 mg/mL bolus/IV push (84.4 mg Intravenous Given 05/16/23 1425)     IMPRESSION / MDM / ASSESSMENT AND PLAN / ED COURSE  I reviewed the triage vital signs and the nursing notes.                              Differential diagnosis includes, but is not limited to, hip dislocation, hip fracture.  Doubt infection or hematoma   ----------------------------------------- 2:42 PM on 05/16/2023 ----------------------------------------- Patient presents with left hip deformity and pain.  X-ray reveals dislocation of her left hip prosthesis.  This is a recurrent issue.  She has had to have closed reduction in the OR in  the past.  She consents for attempted reduction in the ED under deep sedation with propofol.  This was attempted uneventfully without any complications from sedation, but the reduction was unsuccessful.  Contacted orthopedics Dr. Joice Lofts who will plan for closed reduction in OR.   ----------------------------------------- 3:09 PM on 05/16/2023 ----------------------------------------- Per Dr. Joice Lofts, OR is not available today.  Will need to admit to hospitalist for medical management until procedure can be performed tomorrow.     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   Sharman Cheek, MD 05/16/23 1447    Sharman Cheek, MD 05/16/23 [id number]

## 2023-05-16 NOTE — H&P (Signed)
History and Physical    [name] ZOX:[id number] DOB: [birth year] DOA: 05/16/2023  Referring MD/NP/PA:   PCP: Margaretann Loveless, MD   Patient coming from:  The patient is coming from SNF   Chief Complaint: left hip pain  HPI: Yvonne Singleton is a 88 y.o. female with medical history significant of bilateral total hip prosthesis and recurrent dislocations of left hip prosthesis, hypertension, hyperlipidemia, GERD, gout, depression, Parkinson's disease, hard of hearing, breast cancer (s/p of right breast radiation therapy), rheumatoid arthritis, C. difficile colitis, GI bleeding, who presents with left hip pain.  Pt states that she suddenly started having pain in her left hip when she was sitting in her chair after turning a little bit to her side.  The pain is constant, sharp, severe, nonradiating, aggravated by movement.  Patient's left leg is shortened and internally rotated.  Patient does not have chest pain, cough, shortness of breath.  No nausea, vomiting, diarrhea or abdominal pain.  No symptoms of UTI.  Patient states that she just finished a course of Trimpex for UTI.  Today no symptoms of UTI.  X-ray of left hip and femur showed dislocation.  ED physician has tried to reduce her left hip dislocation without success in ED  Data reviewed independently and ED Course: pt was found to have WBC 8.3, slightly worsening renal function, potassium 5.3, temperature 97.3, blood pressure 138/59, heart rate of 58, RR 19, oxygen saturation 100% on room air.  Patient is placed on MedSurg bed for observation.  Dr. Joice Lofts of Ortho is consulted.  X-ray of left hip: Recurrent left total hip dislocation with foreshortening. No definite associated fracture.    EKG: I have personally reviewed.  Sinus rhythm, QTc 428, RAD, anteroseptal infarction pattern   Review of Systems:   General: no fevers, chills, no body weight gain, fatigue HEENT: no blurry vision, hearing changes or sore  throat Respiratory: no dyspnea, coughing, wheezing CV: no chest pain, no palpitations GI: no nausea, vomiting, abdominal pain, diarrhea, constipation GU: no dysuria, burning on urination, increased urinary frequency, hematuria  Ext: no leg edema Neuro: no unilateral weakness, numbness, or tingling, no vision change or hearing loss Skin: no rash, no skin tear. MSK: No muscle spasm, no deformity, no limitation of range of movement in spin. Has left hip pain Heme: No easy bruising.  Travel history: No recent long distant travel.   Allergy:  Allergies  Allergen Reactions   Streptomycin Hives   Digoxin Other (See Comments)    Other reaction(s): Unknown Note: pt had lethargy, felt bad Note: pt had lethargy, felt bad  Other reaction(s): Unknown Note: pt had lethargy, felt bad  Other reaction(s): Unknown Note: pt had lethargy, felt bad Note: pt had lethargy, felt bad   Trospium Nausea And Vomiting   Iodinated Contrast Media Hives    Past Medical History:  Diagnosis Date   Acute metabolic encephalopathy 05/12/2022   Back pain    Breast cancer (HCC) 2007   right breast lumpectomy with rad tx   Cancer (HCC) 2007   right breast   Collagen vascular disease (HCC)    Gout    Hard of hearing    Headache 05/16/2022   Hypertension    Parkinson's disease    Personal history of radiation therapy 2007   F/U right breast cancer   UTI (urinary tract infection) 05/12/2022    Past Surgical History:  Procedure Laterality Date   ABDOMINAL HYSTERECTOMY     ANTERIOR HIP REVISION Right  04/08/2022   Procedure: Anterior hip revision cup and femoral head;  Surgeon: Kennedy Bucker, MD;  Location: [facility] ORS;  Service: Orthopedics;  Laterality: Right;   BREAST LUMPECTOMY Right 2007   suspicious calcs, f/u with radiation   CATARACT EXTRACTION Bilateral    ESOPHAGOGASTRODUODENOSCOPY (EGD) WITH PROPOFOL N/A 05/25/2017   Procedure: ESOPHAGOGASTRODUODENOSCOPY (EGD) WITH PROPOFOL;  Surgeon: Christena Deem, MD;  Location: [facility] ENDOSCOPY;  Service: Endoscopy;  Laterality: N/A;   ESOPHAGOGASTRODUODENOSCOPY (EGD) WITH PROPOFOL N/A 05/09/2018   Procedure: ESOPHAGOGASTRODUODENOSCOPY (EGD) WITH PROPOFOL;  Surgeon: Toney Reil, MD;  Location: [facility] ENDOSCOPY;  Service: Gastroenterology;  Laterality: N/A;   ESOPHAGOGASTRODUODENOSCOPY (EGD) WITH PROPOFOL N/A 12/09/2021   Procedure: ESOPHAGOGASTRODUODENOSCOPY (EGD) WITH PROPOFOL;  Surgeon: Toney Reil, MD;  Location: [facility] ENDOSCOPY;  Service: Gastroenterology;  Laterality: N/A;   EYE SURGERY Bilateral    HAND SURGERY     HIP CLOSED REDUCTION Left 06/24/2015   Procedure: CLOSED REDUCTION HIP;  Surgeon: Donato Heinz, MD;  Location: [facility] ORS;  Service: Orthopedics;  Laterality: Left;   HIP CLOSED REDUCTION Left 02/09/2016   Procedure: CLOSED MANIPULATION HIP;  Surgeon: Deeann Saint, MD;  Location: [facility] ORS;  Service: Orthopedics;  Laterality: Left;   HIP CLOSED REDUCTION Left 10/31/2021   Procedure: CLOSED REDUCTION HIP;  Surgeon: Christena Flake, MD;  Location: [facility] ORS;  Service: Orthopedics;  Laterality: Left;   HIP CLOSED REDUCTION Left 07/05/2022   Procedure: CLOSED REDUCTION HIP;  Surgeon: Donato Heinz, MD;  Location: [facility] OR[facility];  Service: Orthopedics;  Laterality: Left;   HIP CLOSED REDUCTION Left 01/17/2023   Procedure: CLOSED REDUCTION HIP;  Surgeon: Reinaldo Berber, MD;  Location: [facility] OR[facility];  Service: Orthopedics;  Laterality: Left;   HIP SURGERY     JOINT REPLACEMENT     bilateral hip   OOPHORECTOMY     TOTAL KNEE ARTHROPLASTY Bilateral     Social History:  reports that she has never smoked. She has never been exposed to tobacco smoke. She has never used smokeless tobacco. She reports that she does not drink alcohol and does not use drugs.  Family History:  Family History  Problem Relation Age of Onset   Heart disease Mother    Arthritis Mother    Heart disease Father    Ulcers Father    Breast cancer Maternal Aunt       Prior to Admission medications   Medication Sig Start Date End Date Taking? Authorizing Provider  Calcium Carbonate-Vitamin D 600-200 MG-UNIT TABS Take 1 tablet by mouth 2 (two) times daily.   Yes [provider]  carbidopa-levodopa (SINEMET CR) 50-200 MG per tablet Take 1 tablet by mouth 3 (three) times daily.   Yes [provider]  cetirizine (ZYRTEC) 5 MG tablet 5 mg DAILY (route: oral) 05/26/22  Yes [provider]  cholecalciferol (VITAMIN D) 1000 units tablet Take 1,000 Units by mouth daily.   Yes [provider]  furosemide (LASIX) 20 MG tablet Take 20 mg by mouth daily.   Yes [provider]  gabapentin (NEURONTIN) 300 MG capsule Take 1 capsule (300 mg total) by mouth 2 (two) times daily. 04/12/22  Yes Lynn Ito, MD  latanoprost (XALATAN) 0.005 % ophthalmic solution Place 1 drop into both eyes every evening. 02/19/22  Yes [provider]  lisinopril (ZESTRIL) 2.5 MG tablet Take 1 tablet (2.5 mg total) by mouth daily. 04/14/23  Yes Margaretann Loveless, MD  liver oil-zinc oxide (DESITIN) 40 % ointment Apply 1 Application topically  as needed for irritation. 01/30/23  Yes Miki Kins, FNP  oxybutynin (DITROPAN-XL) 10 MG 24 hr tablet Take 1 tablet (10 mg total) by mouth daily. 01/03/23  Yes MacDiarmid, Lorin Picket, MD  oxyCODONE (OXY IR/ROXICODONE) 5 MG immediate release tablet Take 1 tablet (5 mg total) by mouth every 6 (six) hours as needed for severe pain or moderate pain. 07/01/22  Yes Amin, Loura Halt, MD  pantoprazole (PROTONIX) 40 MG tablet Take 1 tablet (40 mg total) by mouth 2 (two) times daily before a meal. 12/09/21 05/16/23 Yes Vanga, Loel Dubonnet, MD  Polyethyl Glycol-Propyl Glycol (SYSTANE) 0.4-0.3 % SOLN Place 1 drop into both eyes daily. 05/26/22  Yes [provider]  sertraline (ZOLOFT) 100 MG tablet Take 100 mg by mouth daily. 09/30/21  Yes [provider]  trimethoprim (TRIMPEX) 100 MG tablet Take 1 tablet (100 mg  total) by mouth daily. 05/02/23  Yes MacDiarmid, Lorin Picket, MD  amLODipine (NORVASC) 10 MG tablet Take 1 tablet (10 mg total) by mouth daily. Patient not taking: Reported on 05/16/2023 05/18/22   Esaw Grandchild A, DO  b complex vitamins tablet Take 1 tablet by mouth daily. Patient not taking: Reported on 05/16/2023    [provider]  ciprofloxacin (CIPRO) 250 MG tablet Take 1 tablet (250 mg total) by mouth 2 (two) times daily. Patient not taking: Reported on 05/16/2023 05/02/23   Alfredo Martinez, MD    Physical Exam: Vitals:   05/16/23 1445 05/16/23 1500 05/16/23 1600 05/16/23 1615  BP: (!) 138/59 (!) 145/61 130/72   Pulse: (!) 58 (!) 49 (!) 55   Resp: 15 12  15   Temp:      TempSrc:      SpO2: 100% 100% 96% 100%  Weight:      Height:       General: Not in acute distress HEENT:       Eyes: PERRL, EOMI, no jaundice       ENT: No discharge from the ears and nose, no pharynx injection, no tonsillar enlargement.        Neck: No JVD, no bruit, no mass felt. Heme: No neck lymph node enlargement. Cardiac: S1/S2, RRR, No murmurs, No gallops or rubs. Respiratory: No rales, wheezing, rhonchi or rubs. GI: Soft, nondistended, nontender, no rebound pain, no organomegaly, BS present. GU: No hematuria Ext: No pitting leg edema bilaterally. 1+DP/PT pulse bilaterally. Musculoskeletal: has tenderness to left hip, left leg is internally rotated and shortened. Skin: No rashes.  Neuro: Alert, oriented X3, cranial nerves II-XII grossly intact, moves all extremities normally.  Psych: Patient is not psychotic, no suicidal or hemocidal ideation.  Labs on Admission: I have personally reviewed following labs and imaging studies  CBC: Recent Labs  Lab 05/16/23 1244  WBC 8.3  NEUTROABS 5.5  HGB 13.7  HCT 43.1  MCV 96.6  PLT 252   Basic Metabolic Panel: Recent Labs  Lab 05/16/23 1244  NA 139  K 5.3*  CL 105  CO2 24  GLUCOSE 95  BUN 48*  CREATININE 1.30*  CALCIUM 9.1    GFR: Estimated Creatinine Clearance: 32 mL/min (A) (by C-G formula based on SCr of 1.3 mg/dL (H)). Liver Function Tests: No results for input(s): "AST", "ALT", "ALKPHOS", "BILITOT", "PROT", "ALBUMIN" in the last 168 hours. No results for input(s): "LIPASE", "AMYLASE" in the last 168 hours. No results for input(s): "AMMONIA" in the last 168 hours. Coagulation Profile: Recent Labs  Lab 05/16/23 1610  INR 1.1   Cardiac Enzymes: No results for  input(s): "CKTOTAL", "CKMB", "CKMBINDEX", "TROPONINI" in the last 168 hours. BNP (last 3 results) No results for input(s): "PROBNP" in the last 8760 hours. HbA1C: No results for input(s): "HGBA1C" in the last 72 hours. CBG: No results for input(s): "GLUCAP" in the last 168 hours. Lipid Profile: No results for input(s): "CHOL", "HDL", "LDLCALC", "TRIG", "CHOLHDL", "LDLDIRECT" in the last 72 hours. Thyroid Function Tests: No results for input(s): "TSH", "T4TOTAL", "FREET4", "T3FREE", "THYROIDAB" in the last 72 hours. Anemia Panel: No results for input(s): "VITAMINB12", "FOLATE", "FERRITIN", "TIBC", "IRON", "RETICCTPCT" in the last 72 hours. Urine analysis:    Component Value Date/Time   COLORURINE STRAW (A) 06/29/2022 1941   APPEARANCEUR Cloudy (A) 05/02/2023 1112   LABSPEC 1.005 06/29/2022 1941   LABSPEC 1.006 09/30/2014 1508   PHURINE 5.0 06/29/2022 1941   GLUCOSEU Negative 05/02/2023 1112   GLUCOSEU Negative 09/30/2014 1508   HGBUR MODERATE (A) 06/29/2022 1941   BILIRUBINUR Negative 05/02/2023 1112   BILIRUBINUR Negative 09/30/2014 1508   KETONESUR NEGATIVE 06/29/2022 1941   PROTEINUR Trace (A) 05/02/2023 1112   PROTEINUR 100 (A) 06/29/2022 1941   NITRITE Positive (A) 05/02/2023 1112   NITRITE NEGATIVE 06/29/2022 1941   LEUKOCYTESUR 3+ (A) 05/02/2023 1112   LEUKOCYTESUR NEGATIVE 06/29/2022 1941   LEUKOCYTESUR 2+ 09/30/2014 1508   Sepsis Labs: @LABRCNTIP (procalcitonin:4,lacticidven:4) )No results found for this or any previous  visit (from the past 240 hour(s)).   Radiological Exams on Admission: DG FEMUR MIN 2 VIEWS LEFT  Result Date: 05/16/2023 CLINICAL DATA:  [id number] Hip dislocation, left, initial encounter [facility]) [id number] EXAM: LEFT FEMUR 2 VIEWS COMPARISON:  Earlier the same day at 12:52 p.m. FINDINGS: There is diffuse osteopenia of the visualized osseous structures. Redemonstration of left hip dislocation with proximal migration of the femoral component in relation to the acetabular component. No acute fracture. No aggressive osseous lesion. Note is made of left knee arthroplasty. IMPRESSION: Redemonstration of left hip dislocation. Electronically Signed   By: Jules Schick M.D.   On: 05/16/2023 16:54   DG Hip Unilat W or Wo Pelvis 2-3 Views Left  Result Date: 05/16/2023 CLINICAL DATA:  Left hip pain.  History of dislocation. EXAM: DG HIP (WITH OR WITHOUT PELVIS) 2-3V LEFT COMPARISON:  Left hip radiographic series-01/17/2023 FINDINGS: Recurrent left total hip dislocation with foreshortening. No definite associated fracture. Expected adjacent soft tissue swelling. No radiopaque foreign body. Limited visualization of the pelvis and contralateral right total hip replacement prosthesis is normal without evidence of hardware failure or loosening within the imaged components. [place] adjacent to the right lesser trochanter is unchanged. Degenerative change of the lower lumbar spine is suspected though incompletely evaluated. IMPRESSION: Recurrent left total hip dislocation with foreshortening. No definite associated fracture. Electronically Signed   By: Simonne Come M.D.   On: 05/16/2023 14:00      Assessment/Plan Principal Problem:   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) Active Problems:   Parkinson disease   Hypertension   Chronic kidney disease, stage 3a (HCC)   Hyperkalemia   Depression   Assessment and Plan:  Recurrent dislocation of left hip joint prosthesis [facility]): consulted Dr. Joice Lofts, since  OR is not available today, will need to wait unitl tomorrow for closed reduction in OR.   - will place in Med-surg bed - Pain control: prn morphine, percocet and tyleno - When necessary Zofran for nausea - Robaxin for muscle spasm - Lidoderm patch for pain - INR/PTT - PT/OT when able to (not ordered now) -NPO after MN  Parkinson  disease -Sinemet -Fall precaution  Hypertension -IV hydralazine as needed -Lisinopril  Chronic kidney disease, stage 3a (HCC): Slightly worsened in the baseline.  Recent baseline creatinine 1.17 on 01/17/2023.  Her creatinine is 1.30, BUN 48, GFR 40 -Hold Lasix  Hyperkalemia: Potassium 5.3 -Patiromer 8.4 g  Depression: --Continue medications        DVT ppx: SCD  Code Status: Full code    Family Communication:   Yes, patient's daughter  at bed side.   Disposition Plan:  Anticipate discharge back to previous environment, SNF  Consults called:  Dr. Joice Lofts of Ortho is consulted.  Admission status and Level of care: Med-Surg:  for obs    Dispo: The patient is from: SNF              Anticipated d/c is to: SNF              Anticipated d/c date is: 1 day              Patient currently is not medically stable to d/c.    Severity of Illness:  The appropriate patient status for this patient is OBSERVATION. Observation status is judged to be reasonable and necessary in order to provide the required intensity of service to ensure the patient's safety. The patient's presenting symptoms, physical exam findings, and initial radiographic and laboratory data in the context of their medical condition is felt to place them at decreased risk for further clinical deterioration. Furthermore, it is anticipated that the patient will be medically stable for discharge from the hospital within 2 midnights of admission.        Date of Service 05/16/2023    Lorretta Harp [facility]   If 7PM-7AM, please contact night-coverage [email or web address] 05/16/2023,  5:06 PM

## 2023-05-17 ENCOUNTER — Observation Stay: Payer: Medicare Other | Admitting: General Practice

## 2023-05-17 ENCOUNTER — Observation Stay: Payer: Medicare Other

## 2023-05-17 ENCOUNTER — Encounter: Payer: Self-pay | Admitting: Internal Medicine

## 2023-05-17 ENCOUNTER — Encounter
Admission: EM | Disposition: A | Discharge status: Home / Self Care | Payer: Self-pay | Source: Home / Self Care | Attending: Emergency Medicine

## 2023-05-17 DIAGNOSIS — F32A Depression, unspecified: Secondary | ICD-10-CM

## 2023-05-17 DIAGNOSIS — N1831 Chronic kidney disease, stage 3a: Secondary | ICD-10-CM | POA: Diagnosis not present

## 2023-05-17 DIAGNOSIS — G20A1 Parkinson's disease without dyskinesia, without mention of fluctuations: Secondary | ICD-10-CM

## 2023-05-17 DIAGNOSIS — I10 Essential (primary) hypertension: Secondary | ICD-10-CM | POA: Diagnosis not present

## 2023-05-17 DIAGNOSIS — T84021A Dislocation of internal left hip prosthesis, initial encounter: Secondary | ICD-10-CM | POA: Diagnosis not present

## 2023-05-17 HISTORY — PX: HIP CLOSED REDUCTION: SHX983

## 2023-05-17 SURGERY — CLOSED REDUCTION, HIP
Anesthesia: General | Site: Hip | Laterality: Left

## 2023-05-17 MED ORDER — LACTATED RINGERS IV SOLN
INTRAVENOUS | Status: DC | PRN
Start: 2023-05-17 — End: 2023-05-17

## 2023-05-17 MED ORDER — CHLORHEXIDINE GLUCONATE 0.12 % MT SOLN
OROMUCOSAL | Status: AC
  Filled 2023-05-17: qty 15

## 2023-05-17 MED ORDER — DEXAMETHASONE SODIUM PHOSPHATE 10 MG/ML IJ SOLN
INTRAMUSCULAR | Status: DC | PRN
  Administered 2023-05-17: 10 mg via INTRAVENOUS

## 2023-05-17 MED ORDER — EPHEDRINE SULFATE (PRESSORS) 50 MG/ML IJ SOLN
INTRAMUSCULAR | Status: DC | PRN
  Administered 2023-05-17: 5 mg via INTRAVENOUS

## 2023-05-17 MED ORDER — PROPOFOL 500 MG/50ML IV EMUL: INTRAVENOUS | Status: DC | PRN

## 2023-05-17 MED ORDER — ONDANSETRON HCL 4 MG/2ML IJ SOLN
INTRAMUSCULAR | Status: DC | PRN
  Administered 2023-05-17: 4 mg via INTRAVENOUS

## 2023-05-17 MED ORDER — FENTANYL CITRATE (PF) 100 MCG/2ML IJ SOLN
25.0000 ug | INTRAMUSCULAR | Status: DC | PRN
  Administered 2023-05-17 (×4): 25 ug via INTRAVENOUS

## 2023-05-17 MED ORDER — PATIROMER SORBITEX CALCIUM 8.4 G PO PACK
16.8000 g | PACK | Freq: Once | ORAL | Status: AC
  Administered 2023-05-17: 16.8 g via ORAL
  Administered 2023-05-17: 8.4 g via ORAL
  Filled 2023-05-17 (×2): qty 2

## 2023-05-17 MED ORDER — PROPOFOL 1000 MG/100ML IV EMUL
INTRAVENOUS | Status: AC
  Filled 2023-05-17: qty 100

## 2023-05-17 MED ORDER — FENTANYL CITRATE (PF) 100 MCG/2ML IJ SOLN
INTRAMUSCULAR | Status: AC
  Filled 2023-05-17: qty 2

## 2023-05-17 MED ORDER — LACTATED RINGERS IV BOLUS
1000.0000 mL | Freq: Once | INTRAVENOUS | Status: AC
  Administered 2023-05-17: 1000 mL via INTRAVENOUS

## 2023-05-17 MED ORDER — OXYCODONE HCL 5 MG PO TABS
ORAL_TABLET | ORAL | Status: AC
  Filled 2023-05-17: qty 1

## 2023-05-17 MED ORDER — PROPOFOL 10 MG/ML IV BOLUS
INTRAVENOUS | Status: DC | PRN
  Administered 2023-05-17 (×5): 50 mg via INTRAVENOUS

## 2023-05-17 MED ORDER — DEXMEDETOMIDINE HCL IN NACL 200 MCG/50ML IV SOLN
INTRAVENOUS | Status: DC | PRN
Start: 2023-05-17 — End: 2023-05-17
  Administered 2023-05-17: 12 ug via INTRAVENOUS

## 2023-05-17 MED ORDER — OXYCODONE HCL 5 MG/5ML PO SOLN: 5.0000 mg | Freq: Once | ORAL | Status: AC | PRN

## 2023-05-17 MED ORDER — PHENYLEPHRINE 80 MCG/ML (10ML) SYRINGE FOR IV PUSH (FOR BLOOD PRESSURE SUPPORT)
PREFILLED_SYRINGE | INTRAVENOUS | Status: DC | PRN
  Administered 2023-05-17 (×2): 160 ug via INTRAVENOUS

## 2023-05-17 MED ORDER — OXYCODONE HCL 5 MG PO TABS
5.0000 mg | ORAL_TABLET | Freq: Once | ORAL | Status: AC | PRN
  Administered 2023-05-17: 5 mg via ORAL

## 2023-05-17 SURGICAL SUPPLY — 13 items
GAUZE 4X4 16PLY ~~LOC~~+RFID DBL (SPONGE) ×1 IMPLANT
GLOVE BIO SURGEON STRL SZ8 (GLOVE) ×2 IMPLANT
GOWN STRL REUS W/ TWL LRG LVL3 (GOWN DISPOSABLE) ×1 IMPLANT
GOWN STRL REUS W/ TWL XL LVL3 (GOWN DISPOSABLE) ×1 IMPLANT
GOWN STRL REUS W/TWL LRG LVL3 (GOWN DISPOSABLE)
GOWN STRL REUS W/TWL XL LVL3 (GOWN DISPOSABLE)
HOLSTER ELECTROSUGICAL PENCIL (MISCELLANEOUS) ×1 IMPLANT
KIT TURNOVER KIT A (KITS) ×1 IMPLANT
MANIFOLD NEPTUNE II (INSTRUMENTS) ×1 IMPLANT
PACK HIP PROSTHESIS (MISCELLANEOUS) ×1 IMPLANT
SPONGE T-LAP 18X18 ~~LOC~~+RFID (SPONGE) ×4 IMPLANT
TRAP FLUID SMOKE EVACUATOR (MISCELLANEOUS) ×1 IMPLANT
WATER STERILE IRR 500ML POUR (IV SOLUTION) ×1 IMPLANT

## 2023-05-17 NOTE — Anesthesia Postprocedure Evaluation (Signed)
Anesthesia Post Note  Patient: Yvonne Singleton  Procedure(s) Performed: CLOSED REDUCTION HIP (Left: Hip)  Patient location during evaluation: PACU Anesthesia Type: General Level of consciousness: awake and alert Pain management: pain level controlled Vital Signs Assessment: post-procedure vital signs reviewed and stable Respiratory status: spontaneous breathing, nonlabored ventilation, respiratory function stable and patient connected to nasal cannula oxygen Cardiovascular status: blood pressure returned to baseline and stable Postop Assessment: no apparent nausea or vomiting Anesthetic complications: no  No notable events documented.   Last Vitals:  Vitals:   05/17/23 0855 05/17/23 0900  BP:  (!) 100/47  Pulse: 75 64  Resp: 19 19  Temp:  37 C  SpO2: 96% 96%    Last Pain:  Vitals:   05/17/23 0900  TempSrc:   PainSc: 4                  Stephanie Coup

## 2023-05-17 NOTE — Discharge Summary (Signed)
Physician Discharge Summary   Patient: Yvonne Singleton MRN: [id number] DOB: [birth year]  Admit date:     05/16/2023  Discharge date: 05/17/23  Discharge Physician: Arnetha Courser   PCP: Margaretann Loveless, MD   Recommendations at discharge:  Please obtain BMP within next 1 to 2 days to make sure that potassium has been normalized. 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  : Yvonne Singleton is a 88 y.o. female with medical history significant of bilateral total hip prosthesis and recurrent dislocations of left hip prosthesis, hypertension, hyperlipidemia, GERD, gout, depression, Parkinson's disease, hard of hearing, breast cancer (s/p of right breast radiation therapy), rheumatoid arthritis, C. difficile colitis, GI bleeding, who presents with left hip pain, which was started while she was trying to turn to her side while sitting in a chair.  X-ray of left hip and femur showed dislocation.  ED physician has tried to reduce her left hip dislocation without success in ED.  Data reviewed independently and ED Course: pt was found to have WBC 8.3, slightly worsening renal function, potassium 5.3   Orthopedic surgery was consulted.  Patient was taken to the OR s/p closed reduction of left hip.  Orthopedic surgery is recommending discharge and a close follow-up with her orthopedic surgeon Dr. Ernest Pine in the clinic for further management.  7/30: Hemodynamically stable with borderline soft blood pressure.  Labs with potassium of 5.3, BUN 45 and creatinine 1.33.  1 L bolus and a repeat dose of Veltassa ordered.  Patient remained stable.  Received a bolus after surgery for softer blood  pressure.  Blood pressure improved.  Patient received 2 doses of Veltassa for borderline hyperkalemia at 5.3.  She will need a repeat potassium in next 1 to 2 days which can be done at her facility.  Patient is wheelchair-bound at baseline.  A left knee immobilizer is in place and she should avoid bearing weight on left.  Patient need to follow-up with her own orthopedic surgeon Dr. Wilkie Aye within the next 1 to 2 weeks for further recommendations.  Home health orders for PT and OT was placed.  Patient will continue her current medications and need to have a close follow-up with her providers.   Pain control - [employer] Controlled Substance Reporting System database was reviewed. and patient was instructed, not to drive, operate heavy machinery, perform activities at heights, swimming or participation in water activities or provide baby-sitting services while on Pain, Sleep and Anxiety Medications; until their outpatient Physician has advised to do so again. Also recommended to not to take more than prescribed Pain, Sleep and Anxiety Medications.  Consultants: Orthopedic surgery Procedures performed: Closed reduction of displaced left hip Disposition: Assisted living Diet recommendation:  Discharge Diet Orders (From admission, onward)     Start     Ordered   05/17/23 0000  Diet - low sodium heart healthy        05/17/23 1107           Cardiac diet DISCHARGE MEDICATION: Allergies as of 05/17/2023       Reactions   Streptomycin Hives   Digoxin Other (See Comments)   Other reaction(s): Unknown Note: pt had lethargy, felt bad Note: pt  had lethargy, felt bad Other reaction(s): Unknown Note: pt had lethargy, felt bad Other reaction(s): Unknown Note: pt had lethargy, felt bad Note: pt had lethargy, felt bad   Trospium Nausea And Vomiting   Iodinated Contrast Media Hives        Medication List     STOP taking these medications    amLODipine 10 MG tablet Commonly known as:  NORVASC   b complex vitamins tablet   ciprofloxacin 250 MG tablet Commonly known as: Cipro       TAKE these medications    Calcium Carbonate-Vitamin D 600-200 MG-UNIT Tabs Take 1 tablet by mouth 2 (two) times daily.   carbidopa-levodopa 50-200 MG tablet Commonly known as: SINEMET CR Take 1 tablet by mouth 3 (three) times daily.   cetirizine 5 MG tablet Commonly known as: ZYRTEC 5 mg DAILY (route: oral)   cholecalciferol 1000 units tablet Commonly known as: VITAMIN D Take 1,000 Units by mouth daily.   furosemide 20 MG tablet Commonly known as: LASIX Take 20 mg by mouth daily.   gabapentin 300 MG capsule Commonly known as: NEURONTIN Take 1 capsule (300 mg total) by mouth 2 (two) times daily.   latanoprost 0.005 % ophthalmic solution Commonly known as: XALATAN Place 1 drop into both eyes every evening.   lisinopril 2.5 MG tablet Commonly known as: ZESTRIL Take 1 tablet (2.5 mg total) by mouth daily.   liver oil-zinc oxide 40 % ointment Commonly known as: DESITIN Apply 1 Application topically as needed for irritation.   oxybutynin 10 MG 24 hr tablet Commonly known as: DITROPAN-XL Take 1 tablet (10 mg total) by mouth daily.   oxyCODONE 5 MG immediate release tablet Commonly known as: Oxy IR/ROXICODONE Take 1 tablet (5 mg total) by mouth every 6 (six) hours as needed for severe pain or moderate pain.   pantoprazole 40 MG tablet Commonly known as: PROTONIX Take 1 tablet (40 mg total) by mouth 2 (two) times daily before a meal.   sertraline 100 MG tablet Commonly known as: ZOLOFT Take 100 mg by mouth daily.   Systane 0.4-0.3 % Soln Generic drug: Polyethyl Glycol-Propyl Glycol Place 1 drop into both eyes daily.   trimethoprim 100 MG tablet Commonly known as: TRIMPEX Take 1 tablet (100 mg total) by mouth daily.        Follow-up Information     Margaretann Loveless, MD. Schedule an appointment as soon as possible for a visit in 1 week(s).   Specialty:  Internal Medicine Contact information: [address] [place] [address] [phone]         Donato Heinz, MD. Schedule an appointment as soon as possible for a visit in 1 week(s).   Specialty: Orthopedic Surgery Contact information: [address] [facility] [place] [address] [phone]                Discharge Exam: Ceasar Mons Weights   05/16/23 1242 05/17/23 0701  Weight: 84.4 kg 84.4 kg   General.  Frail elderly lady, in no acute distress. Pulmonary.  Lungs clear bilaterally, normal respiratory effort. CV.  Regular rate and rhythm, no JVD, rub or murmur. Abdomen.  Soft, nontender, nondistended, BS positive. CNS.  Alert and oriented .  No focal neurologic deficit. Extremities.  No edema, no cyanosis, pulses intact and symmetrical.  Left lower extremity with knee immobilizer Psychiatry.  Judgment and insight appears normal.   Condition at discharge: stable  The results of significant diagnostics from this hospitalization (including imaging,  microbiology, ancillary and laboratory) are listed below for reference.   Imaging Studies: DG C-Arm 1-60 Min-No Report  Result Date: 05/17/2023 Fluoroscopy was utilized by the requesting physician.  No radiographic interpretation.   DG FEMUR MIN 2 VIEWS LEFT  Result Date: 05/16/2023 CLINICAL DATA:  [id number] Hip dislocation, left, initial encounter (HCC) [id number] EXAM: LEFT FEMUR 2 VIEWS COMPARISON:  Earlier the same day at 12:52 p.m. FINDINGS: There is diffuse osteopenia of the visualized osseous structures. Redemonstration of left hip dislocation with proximal migration of the femoral component in relation to the acetabular component. No acute fracture. No aggressive osseous lesion. Note is made of left knee arthroplasty. IMPRESSION: Redemonstration of left hip dislocation. Electronically Signed   By: Jules Schick M.D.   On: 05/16/2023 16:54   DG Hip Unilat W or Wo Pelvis 2-3 Views Left  Result Date:  05/16/2023 CLINICAL DATA:  Left hip pain.  History of dislocation. EXAM: DG HIP (WITH OR WITHOUT PELVIS) 2-3V LEFT COMPARISON:  Left hip radiographic series-01/17/2023 FINDINGS: Recurrent left total hip dislocation with foreshortening. No definite associated fracture. Expected adjacent soft tissue swelling. No radiopaque foreign body. Limited visualization of the pelvis and contralateral right total hip replacement prosthesis is normal without evidence of hardware failure or loosening within the imaged components. [place] adjacent to the right lesser trochanter is unchanged. Degenerative change of the lower lumbar spine is suspected though incompletely evaluated. IMPRESSION: Recurrent left total hip dislocation with foreshortening. No definite associated fracture. Electronically Signed   By: Simonne Come M.D.   On: 05/16/2023 14:00    Microbiology: Results for orders placed or performed during the hospital encounter of 05/16/23  MRSA Next Gen by PCR, Nasal     Status: None   Collection Time: 05/16/23  7:38 PM   Specimen: Nasal Mucosa; Nasal Swab  Result Value Ref Range Status   MRSA by PCR Next Gen NOT DETECTED NOT DETECTED Final    Comment: (NOTE) The GeneXpert MRSA Assay (FDA approved for NASAL specimens only), is one component of a comprehensive MRSA colonization surveillance program. It is not intended to diagnose MRSA infection nor to guide or monitor treatment for MRSA infections. Test performance is not FDA approved in patients less than 58 years old. Performed at [facility], [address] [place], [place] [address]     Labs: CBC: Recent Labs  Lab 05/16/23 1244 05/17/23 0410  WBC 8.3 7.8  NEUTROABS 5.5  --   HGB 13.7 12.7  HCT 43.1 40.4  MCV 96.6 97.8  PLT 252 230   Basic Metabolic Panel: Recent Labs  Lab 05/16/23 1244 05/17/23 0410  NA 139 140  K 5.3* 5.3*  CL 105 105  CO2 24 26  GLUCOSE 95 93  BUN 48* 45*  CREATININE 1.30* 1.33*  CALCIUM 9.1 9.4    Liver Function Tests: No results for input(s): "AST", "ALT", "ALKPHOS", "BILITOT", "PROT", "ALBUMIN" in the last 168 hours. CBG: No results for input(s): "GLUCAP" in the last 168 hours.  Discharge time spent: greater than 30 minutes.  This record has been created using Conservation officer, historic buildings. Errors have been sought and corrected,but may not always be located. Such creation errors do not reflect on the standard of care.   Signed: Arnetha Courser, MD [facility] 05/17/2023

## 2023-05-17 NOTE — ED Notes (Signed)
Pt transported via stretcher to Pre-op. Belongings placed in a labeled bag and on the bed. Pt assisted with voiding prior to leaving the room.

## 2023-05-17 NOTE — Transfer of Care (Signed)
Immediate Anesthesia Transfer of Care Note  Patient: Yvonne Singleton  Procedure(s) Performed: CLOSED REDUCTION HIP (Left: Hip)  Patient Location: PACU  Anesthesia Type:General  Level of Consciousness: awake, drowsy, and patient cooperative  Airway & Oxygen Therapy: Patient Spontanous Breathing and Patient connected to face mask oxygen  Post-op Assessment: Report given to RN and Post -op Vital signs reviewed and stable  Post vital signs: Reviewed and stable  Last Vitals:  Vitals Value Taken Time  BP 90/43 05/17/23 0749  Temp    Pulse    Resp 23 05/17/23 0752  SpO2    Vitals shown include unfiled device data.  Last Pain:  Vitals:   05/17/23 0701  TempSrc: Oral  PainSc: 0-No pain         Complications: No notable events documented.

## 2023-05-17 NOTE — Progress Notes (Signed)
Gave verbal report to [place] via phone call at [facility] assistant living.

## 2023-05-17 NOTE — Anesthesia Procedure Notes (Signed)
Procedure Name: General with mask airway Date/Time: 05/17/2023 7:22 AM  Performed by: Mohammed Kindle, CRNAPre-anesthesia Checklist: Patient identified, Emergency Drugs available, Suction available, Patient being monitored and Timeout performed Oxygen Delivery Method: Simple face mask Induction Type: IV induction Placement Confirmation: positive ETCO2, CO2 detector and breath sounds checked- equal and bilateral Dental Injury: Teeth and Oropharynx as per pre-operative assessment

## 2023-05-17 NOTE — TOC Progression Note (Signed)
Transition of Care [facility]) - Progression Note    Patient Details  Name: Guylene Jentzsch MRN: [id number] Date of Birth: January 19, 1935  Transition of Care [facility]) CM/SW Contact  Marlowe Sax, RN Phone Number: 05/17/2023, 9:15 AM  Clinical Narrative:     Reached out to [facility] and spoke to [place] and she stated that after Surgery patient's have to go to STR, I explained this is an elective surgery and that she is Observation, Medicare does not cover to to go STR, She stated that the patient will be in the bed and they can not managed total care, I explained it is high discouraged for anyone to lay in the bed, that she should not be a total care patient.  She stated that she has to reach out to the director and she will call me back.       Expected Discharge Plan and Services                                               Social Determinants of Health (SDOH) Interventions SDOH Screenings   Food Insecurity: No Food Insecurity (05/16/2023)  Housing: Low Risk  (05/16/2023)  Transportation Needs: No Transportation Needs (05/16/2023)  Utilities: Not At Risk (05/16/2023)  Tobacco Use: Low Risk  (05/17/2023)    Readmission Risk Interventions     No data to display

## 2023-05-17 NOTE — Anesthesia Preprocedure Evaluation (Signed)
Anesthesia Evaluation  Patient identified by MRN, date of birth, ID band Patient awake    Reviewed: Allergy & Precautions, NPO status , Patient's Chart, lab work & pertinent test results  Airway Mallampati: III  TM Distance: >3 FB Neck ROM: full    Dental  (+) Dental Advidsory Given, Lower Dentures, Upper Dentures   Pulmonary neg pulmonary ROS   Pulmonary exam normal        Cardiovascular hypertension, (-) angina (-) Past MI and (-) CABG negative cardio ROS Normal cardiovascular exam     Neuro/Psych  PSYCHIATRIC DISORDERS  Depression    negative neurological ROS     GI/Hepatic Neg liver ROS, hiatal hernia,GERD  ,,  Endo/Other  negative endocrine ROS    Renal/GU negative Renal ROS  negative genitourinary   Musculoskeletal   Abdominal   Peds  Hematology negative hematology ROS (+)   Anesthesia Other Findings Past Medical History: [id number]: Acute metabolic encephalopathy No date: Back pain 2007: Breast cancer ([place])     Comment:  right breast lumpectomy with rad tx 2007: Cancer ([place])     Comment:  right breast No date: Collagen vascular disease ([place]) No date: Gout No date: Hard of hearing 05/16/2022: Headache No date: Hypertension No date: Parkinson's disease ([place]) 2007: Personal history of radiation therapy     Comment:  F/U right breast cancer 05/12/2022: UTI (urinary tract infection)  Past Surgical History: No date: ABDOMINAL HYSTERECTOMY 04/08/2022: ANTERIOR HIP REVISION; Right     Comment:  Procedure: Anterior hip revision cup and femoral head;                Surgeon: Hessie Knows, MD;  Location: [facility];                Service: Orthopedics;  Laterality: Right; 2007: BREAST LUMPECTOMY; Right     Comment:  suspicious calcs, f/u with radiation No date: CATARACT EXTRACTION; Bilateral 05/25/2017: ESOPHAGOGASTRODUODENOSCOPY (EGD) WITH PROPOFOL; N/A     Comment:  Procedure: ESOPHAGOGASTRODUODENOSCOPY (EGD)  WITH               PROPOFOL;  Surgeon: Lollie Sails, MD;  Location:               [facility];  Service: Endoscopy;  Laterality: N/A; 05/09/2018: ESOPHAGOGASTRODUODENOSCOPY (EGD) WITH PROPOFOL; N/A     Comment:  Procedure: ESOPHAGOGASTRODUODENOSCOPY (EGD) WITH               PROPOFOL;  Surgeon: Lin Landsman, MD;  Location:               [facility];  Service: Gastroenterology;  Laterality:               N/A; 12/09/2021: ESOPHAGOGASTRODUODENOSCOPY (EGD) WITH PROPOFOL; N/A     Comment:  Procedure: ESOPHAGOGASTRODUODENOSCOPY (EGD) WITH               PROPOFOL;  Surgeon: Lin Landsman, MD;  Location:               [facility];  Service: Gastroenterology;  Laterality:               N/A; No date: EYE SURGERY; Bilateral No date: HAND SURGERY 06/24/2015: HIP CLOSED REDUCTION; Left     Comment:  Procedure: CLOSED REDUCTION HIP;  Surgeon: Dereck Leep, MD;  Location: [facility];  Service: Orthopedics;  Laterality: Left; 02/09/2016: HIP CLOSED REDUCTION; Left     Comment:  Procedure: CLOSED MANIPULATION HIP;  Surgeon: Earnestine Leys, MD;  Location: [facility];  Service: Orthopedics;                Laterality: Left; 10/31/2021: HIP CLOSED REDUCTION; Left     Comment:  Procedure: CLOSED REDUCTION HIP;  Surgeon: Corky Mull, MD;  Location: [facility];  Service: Orthopedics;                Laterality: Left; No date: HIP SURGERY No date: JOINT REPLACEMENT     Comment:  bilateral hip No date: OOPHORECTOMY No date: TOTAL KNEE ARTHROPLASTY; Bilateral     Reproductive/Obstetrics negative OB ROS                             Anesthesia Physical Anesthesia Plan  ASA: 3  Anesthesia Plan: General   Post-op Pain Management: Minimal or no pain anticipated   Induction: Intravenous  PONV Risk Score and Plan: 3 and Propofol infusion, TIVA and Ondansetron  Airway Management Planned: Nasal Cannula and  Natural Airway  Additional Equipment: None  Intra-op Plan:   Post-operative Plan:   Informed Consent: I have reviewed the patients History and Physical, chart, labs and discussed the procedure including the risks, benefits and alternatives for the proposed anesthesia with the patient or authorized representative who has indicated his/her understanding and acceptance.     Dental advisory given  Plan Discussed with: CRNA and Surgeon  Anesthesia Plan Comments: (Discussed risks of anesthesia with patient, including possibility of difficulty with spontaneous ventilation under anesthesia necessitating airway intervention, PONV, and rare risks such as cardiac or respiratory or neurological events, and allergic reactions. Discussed the role of CRNA in patient's perioperative care. Patient understands.)        Anesthesia Quick Evaluation

## 2023-05-17 NOTE — ED Notes (Signed)
With the assistance of EDT Zachery Dakins) the patients clothing was removed and placed in a belongings bag and soiled brief removed. Pt made comfortable and call bell within reach.

## 2023-05-17 NOTE — Op Note (Signed)
05/17/2023  7:53 AM  Patient:   Yvonne Singleton  Pre-Op Diagnosis:   Closed posterior left prothesic hip dislocation.  Post-Op Diagnosis:   Same.  Procedure:   Closed reduction of left prosthetic hip dislocation.  Surgeon:   Maryagnes Amos, MD  Assistant:   None  Anesthesia:   IV sedation  Findings:   As above.  Complications:   None  EBL:   None  Fluids:   500 cc crystalloid  TT:   None  Drains:   None  Closure:   None  Implants:   None  Brief Clinical Note:   The patient is an 88 year old female who presented yesterday with a recurrent prosthetic dislocation of her left hip after an incident in which she apparently twisted while seated in her wheelchair. She denies any falls which may have precipitated this injury. She was unable to be reduced by the ER provider, so she was admitted and is brought to the operating room at this time for closed reduction under anesthesia of this dislocation.  Procedure:   The patient was brought into the operating room and lain in the supine position. After adequate IV sedation was obtained, a timeout was performed to verify the appropriate surgical site. The hip was then reduced using longitudinal traction with internal rotation and flexion while countertraction was applied to the pelvis. The hip was felt to clunk back into place. The leg lengths were now equal and leg rotation was symmetric to the contralateral side. The hip was stable to flexion to 90 with internal rotation to 10. The adequacy of reduction was confirmed by fluoroscopic imaging before the patient's left lower extremity was placed into a knee immobilizer. The patient was then awakened and returned to the recovery room in satisfactory condition after tolerating the procedure well.

## 2023-05-17 NOTE — Hospital Course (Addendum)
Taken from H&P and prior notes.  : Yvonne Singleton is a 88 y.o. female with medical history significant of bilateral total hip prosthesis and recurrent dislocations of left hip prosthesis, hypertension, hyperlipidemia, GERD, gout, depression, Parkinson's disease, hard of hearing, breast cancer (s/p of right breast radiation therapy), rheumatoid arthritis, C. difficile colitis, GI bleeding, who presents with left hip pain, which was started while she was trying to turn to her side while sitting in a chair.  X-ray of left hip and femur showed dislocation.  ED physician has tried to reduce her left hip dislocation without success in ED.  Data reviewed independently and ED Course: pt was found to have WBC 8.3, slightly worsening renal function, potassium 5.3   Orthopedic surgery was consulted.  Patient was taken to the OR s/p closed reduction of left hip.  Orthopedic surgery is recommending discharge and a close follow-up with her orthopedic surgeon Dr. Ernest Pine in the clinic for further management.  7/30: Hemodynamically stable with borderline soft blood pressure.  Labs with potassium of 5.3, BUN 45 and creatinine 1.33.  1 L bolus and a repeat dose of Veltassa ordered.  Patient remained stable.  Received a bolus after surgery for softer blood pressure.  Blood pressure improved.  Patient received 2 doses of Veltassa for borderline hyperkalemia at 5.3.  She will need a repeat potassium in next 1 to 2 days which can be done at her facility.  Patient is wheelchair-bound at baseline.  A left knee immobilizer is in place and she should avoid bearing weight on left.  Patient need to follow-up with her own orthopedic surgeon Dr. Wilkie Aye within the next 1 to 2 weeks for further recommendations.  Home health orders for PT and OT was placed.  Patient will continue her current medications and need to have a close follow-up with her providers.

## 2023-05-18 ENCOUNTER — Encounter: Payer: Self-pay | Admitting: Surgery

## 2023-06-13 ENCOUNTER — Telehealth: Payer: Self-pay

## 2023-06-13 MED ORDER — BENZONATATE 100 MG PO CAPS: 100.0000 mg | ORAL_CAPSULE | Freq: Three times a day (TID) | ORAL | 0 refills | Status: AC | PRN

## 2023-06-13 NOTE — Telephone Encounter (Signed)
Rx sent and Kayla notified.

## 2023-06-14 ENCOUNTER — Telehealth: Payer: Self-pay | Admitting: Internal Medicine

## 2023-06-14 MED ORDER — MOLNUPIRAVIR EUA 200MG CAPSULE: 4.0000 | ORAL_CAPSULE | Freq: Two times a day (BID) | ORAL | 0 refills | Status: AC

## 2023-06-14 NOTE — Telephone Encounter (Signed)
Patient positive for Covid, [facility] called to see if we needed to send in anything. Per Dr. Welton Flakes verbal we send in Lagevrio.

## 2023-06-21 ENCOUNTER — Ambulatory Visit (INDEPENDENT_AMBULATORY_CARE_PROVIDER_SITE_OTHER): Payer: Medicare Other | Admitting: Internal Medicine

## 2023-06-21 ENCOUNTER — Encounter: Payer: Self-pay | Admitting: Internal Medicine

## 2023-06-21 VITALS — BP 122/64 | HR 51 | Ht 63.5 in | Wt 173.0 lb

## 2023-06-21 DIAGNOSIS — K219 Gastro-esophageal reflux disease without esophagitis: Secondary | ICD-10-CM

## 2023-06-21 DIAGNOSIS — Z23 Encounter for immunization: Secondary | ICD-10-CM | POA: Diagnosis not present

## 2023-06-21 DIAGNOSIS — E782 Mixed hyperlipidemia: Secondary | ICD-10-CM

## 2023-06-21 DIAGNOSIS — I10 Essential (primary) hypertension: Secondary | ICD-10-CM | POA: Diagnosis not present

## 2023-06-21 DIAGNOSIS — M1000 Idiopathic gout, unspecified site: Secondary | ICD-10-CM

## 2023-06-21 DIAGNOSIS — G20A2 Parkinson's disease without dyskinesia, with fluctuations: Secondary | ICD-10-CM

## 2023-06-21 NOTE — Progress Notes (Signed)
Established Patient Office Visit  Subjective:  Patient ID: Yvonne Singleton, female    DOB: 09/08/1935  Age: 88 y.o. MRN: [id number]  Chief Complaint  Patient presents with   Follow-up    4 month follow up    Patient comes in for her follow-up .  Today she is feeling better but needs to wear a brace on her left leg to prevent recurrent dislocation of her left hip.  She is in a wheelchair but is able to use a walker in her room. Generally feels well and is taking all her medications.  Recently had COVID infection, now recovered completely with Lagevrio. She is not due for her blood work today, we will check it next time.    No other concerns at this time.   Past Medical History:  Diagnosis Date   Acute metabolic encephalopathy 05/12/2022   Back pain    Breast cancer [facility]) 2007   right breast lumpectomy with rad tx   Cancer [facility]) 2007   right breast   Collagen vascular disease (HCC)    Gout    Hard of hearing    Headache 05/16/2022   Hypertension    Parkinson's disease    Personal history of radiation therapy 2007   F/U right breast cancer   UTI (urinary tract infection) 05/12/2022    Past Surgical History:  Procedure Laterality Date   ABDOMINAL HYSTERECTOMY     ANTERIOR HIP REVISION Right 04/08/2022   Procedure: Anterior hip revision cup and femoral head;  Surgeon: Kennedy Bucker, MD;  Location: [facility] ORS;  Service: Orthopedics;  Laterality: Right;   BREAST LUMPECTOMY Right 2007   suspicious calcs, f/u with radiation   CATARACT EXTRACTION Bilateral    ESOPHAGOGASTRODUODENOSCOPY (EGD) WITH PROPOFOL N/A 05/25/2017   Procedure: ESOPHAGOGASTRODUODENOSCOPY (EGD) WITH PROPOFOL;  Surgeon: Christena Deem, MD;  Location: [facility] ENDOSCOPY;  Service: Endoscopy;  Laterality: N/A;   ESOPHAGOGASTRODUODENOSCOPY (EGD) WITH PROPOFOL N/A 05/09/2018   Procedure: ESOPHAGOGASTRODUODENOSCOPY (EGD) WITH PROPOFOL;  Surgeon: Toney Reil, MD;  Location: [facility] ENDOSCOPY;  Service:  Gastroenterology;  Laterality: N/A;   ESOPHAGOGASTRODUODENOSCOPY (EGD) WITH PROPOFOL N/A 12/09/2021   Procedure: ESOPHAGOGASTRODUODENOSCOPY (EGD) WITH PROPOFOL;  Surgeon: Toney Reil, MD;  Location: [facility] ENDOSCOPY;  Service: Gastroenterology;  Laterality: N/A;   EYE SURGERY Bilateral    HAND SURGERY     HIP CLOSED REDUCTION Left 06/24/2015   Procedure: CLOSED REDUCTION HIP;  Surgeon: Donato Heinz, MD;  Location: [facility] ORS;  Service: Orthopedics;  Laterality: Left;   HIP CLOSED REDUCTION Left 02/09/2016   Procedure: CLOSED MANIPULATION HIP;  Surgeon: Deeann Saint, MD;  Location: [facility] ORS;  Service: Orthopedics;  Laterality: Left;   HIP CLOSED REDUCTION Left 10/31/2021   Procedure: CLOSED REDUCTION HIP;  Surgeon: Christena Flake, MD;  Location: [facility] ORS;  Service: Orthopedics;  Laterality: Left;   HIP CLOSED REDUCTION Left 07/05/2022   Procedure: CLOSED REDUCTION HIP;  Surgeon: Donato Heinz, MD;  Location: [facility] ORS;  Service: Orthopedics;  Laterality: Left;   HIP CLOSED REDUCTION Left 01/17/2023   Procedure: CLOSED REDUCTION HIP;  Surgeon: Reinaldo Berber, MD;  Location: [facility] ORS;  Service: Orthopedics;  Laterality: Left;   HIP CLOSED REDUCTION Left 05/17/2023   Procedure: CLOSED REDUCTION HIP;  Surgeon: Christena Flake, MD;  Location: [facility] ORS;  Service: Orthopedics;  Laterality: Left;   HIP SURGERY     JOINT REPLACEMENT     bilateral hip   OOPHORECTOMY     TOTAL KNEE ARTHROPLASTY Bilateral  Social History   Socioeconomic History   Marital status: Widowed    Spouse name: Not on file   Number of children: Not on file   Years of education: Not on file   Highest education level: Not on file  Occupational History   Not on file  Tobacco Use   Smoking status: Never    Passive exposure: Never   Smokeless tobacco: Never  Substance and Sexual Activity   Alcohol use: No    Alcohol/week: 0.0 standard drinks of alcohol   Drug use: No   Sexual activity: Not Currently  Other Topics  Concern   Not on file  Social History Narrative   Not on file   Social Determinants of Health   Financial Resource Strain: Not on file  Food Insecurity: No Food Insecurity (05/16/2023)   Hunger Vital Sign    Worried About Running Out of Food in the Last Year: Never true    Ran Out of Food in the Last Year: Never true  Transportation Needs: No Transportation Needs (05/16/2023)   PRAPARE - Administrator, Civil Service (Medical): No    Lack of Transportation (Non-Medical): No  Physical Activity: Not on file  Stress: Not on file  Social Connections: Not on file  Intimate Partner Violence: Not At Risk (05/16/2023)   Humiliation, Afraid, Rape, and Kick questionnaire    Fear of Current or Ex-Partner: No    Emotionally Abused: No    Physically Abused: No    Sexually Abused: No    Family History  Problem Relation Age of Onset   Heart disease Mother    Arthritis Mother    Heart disease Father    Ulcers Father    Breast cancer Maternal Aunt     Allergies  Allergen Reactions   Streptomycin Hives   Digoxin Other (See Comments)    Other reaction(s): Unknown Note: pt had lethargy, felt bad Note: pt had lethargy, felt bad  Other reaction(s): Unknown Note: pt had lethargy, felt bad  Other reaction(s): Unknown Note: pt had lethargy, felt bad Note: pt had lethargy, felt bad   Trospium Nausea And Vomiting   Iodinated Contrast Media Hives    Review of Systems  Constitutional: Negative.  Negative for chills, fever, malaise/fatigue and weight loss.  HENT:  Positive for hearing loss. Negative for ear pain.   Eyes: Negative.   Respiratory: Negative.  Negative for cough and shortness of breath.   Cardiovascular: Negative.  Negative for chest pain, palpitations and leg swelling.  Gastrointestinal: Negative.  Negative for abdominal pain, constipation, diarrhea, heartburn, nausea and vomiting.  Genitourinary: Negative.  Negative for dysuria and flank pain.  Musculoskeletal:   Positive for back pain and joint pain. Negative for myalgias.  Skin: Negative.   Neurological: Negative.  Negative for dizziness and headaches.  Endo/Heme/Allergies: Negative.   Psychiatric/Behavioral: Negative.  Negative for depression and suicidal ideas. The patient is not nervous/anxious.        Objective:   BP 122/64   Pulse (!) 51   Ht 5' 3.5" (1.613 m)   Wt 173 lb (78.5 kg)   SpO2 94%   BMI 30.16 kg/m   Vitals:   06/21/23 1036  BP: 122/64  Pulse: (!) 51  Height: 5' 3.5" (1.613 m)  Weight: 173 lb (78.5 kg)  SpO2: 94%  BMI (Calculated): 30.16    Physical Exam Vitals and nursing note reviewed.  Constitutional:      Appearance: Normal appearance.  HENT:  Head: Normocephalic and atraumatic.     Nose: Nose normal.     Mouth/Throat:     Mouth: Mucous membranes are moist.     Pharynx: Oropharynx is clear.  Eyes:     Conjunctiva/sclera: Conjunctivae normal.     Pupils: Pupils are equal, round, and reactive to light.  Cardiovascular:     Rate and Rhythm: Normal rate and regular rhythm.     Pulses: Normal pulses.     Heart sounds: Normal heart sounds. No murmur heard. Pulmonary:     Effort: Pulmonary effort is normal.     Breath sounds: Normal breath sounds. No wheezing.  Abdominal:     General: Bowel sounds are normal.     Palpations: Abdomen is soft.     Tenderness: There is no abdominal tenderness. There is no right CVA tenderness or left CVA tenderness.  Musculoskeletal:        General: Normal range of motion.     Cervical back: Normal range of motion.     Right lower leg: No edema.     Left lower leg: No edema.  Skin:    General: Skin is warm and dry.  Neurological:     General: No focal deficit present.     Mental Status: She is alert and oriented to person, place, and time.  Psychiatric:        Mood and Affect: Mood normal.        Behavior: Behavior normal.      No results found for any visits on 06/21/23.  Recent Results (from the past  2160 hour(s))  Urinalysis, Complete     Status: Abnormal   Collection Time: 05/02/23 11:12 AM  Result Value Ref Range   Specific Gravity, UA 1.015 1.005 - 1.030   pH, UA 7.0 5.0 - 7.5   Color, UA Yellow Yellow   Appearance Ur Cloudy (A) Clear   Leukocytes,UA 3+ (A) Negative   Protein,UA Trace (A) Negative/Trace   Glucose, UA Negative Negative   Ketones, UA Negative Negative   RBC, UA 1+ (A) Negative   Bilirubin, UA Negative Negative   Urobilinogen, Ur 0.2 0.2 - 1.0 mg/dL   Nitrite, UA Positive (A) Negative   Microscopic Examination See below:   Microscopic Examination     Status: Abnormal   Collection Time: 05/02/23 11:12 AM   Urine  Result Value Ref Range   WBC, UA >30 (A) 0 - 5 /hpf   RBC, Urine 3-10 (A) 0 - 2 /hpf   Epithelial Cells (non renal) 0-10 0 - 10 /hpf   Casts Present (A) None seen /lpf   Cast Type Granular casts (A) N/A   Crystals Present (A) N/A   Crystal Type Amorphous Sediment N/A   Mucus, UA Present (A) Not Estab.   Bacteria, UA Many (A) None seen/Few  Bladder Scan (Post Void Residual) in office     Status: None   Collection Time: 05/02/23 11:20 AM  Result Value Ref Range   Scan Result 23 ml   CULTURE, URINE COMPREHENSIVE     Status: Abnormal   Collection Time: 05/02/23 11:26 AM   Specimen: Urine   UR  Result Value Ref Range   Urine Culture, Comprehensive Final report (A)    Organism ID, Bacteria Proteus mirabilis (A)     Comment: Greater than 100,000 colony forming units per mL Cefazolin <=4 ug/mL Cefazolin with an MIC <=16 predicts susceptibility to the oral agents cefaclor, cefdinir, cefpodoxime, cefprozil, cefuroxime, cephalexin, and loracarbef when  used for therapy of uncomplicated urinary tract infections due to E. coli, Klebsiella pneumoniae, and Proteus mirabilis.    ANTIMICROBIAL SUSCEPTIBILITY Comment     Comment:       ** S = Susceptible; I = Intermediate; R = Resistant **                    P = Positive; N = Negative             MICS  are expressed in micrograms per mL    Antibiotic                 RSLT#1    RSLT#2    RSLT#3    RSLT#4 Amoxicillin/Clavulanic Acid    S Ampicillin                     S Cefepime                       S Ceftriaxone                    S Cefuroxime                     S Ciprofloxacin                  S Ertapenem                      S Gentamicin                     S Levofloxacin                   S Meropenem                      S Nitrofurantoin                 R Piperacillin/Tazobactam        S Tetracycline                   R Tobramycin                     S Trimethoprim/Sulfa             S   Basic metabolic panel     Status: Abnormal   Collection Time: 05/16/23 12:44 PM  Result Value Ref Range   Sodium 139 135 - 145 mmol/L   Potassium 5.3 (H) 3.5 - 5.1 mmol/L   Chloride 105 98 - 111 mmol/L   CO2 24 22 - 32 mmol/L   Glucose, Bld 95 70 - 99 mg/dL    Comment: Glucose reference range applies only to samples taken after fasting for at least 8 hours.   BUN 48 (H) 8 - 23 mg/dL   Creatinine, Ser 9.14 (H) 0.44 - 1.00 mg/dL   Calcium 9.1 8.9 - 78.2 mg/dL   GFR, Estimated 40 (L) >60 mL/min    Comment: (NOTE) Calculated using the CKD-EPI Creatinine Equation (2021)    Anion gap 10 5 - 15    Comment: Performed at [facility], [address] [place], [place] [address]  CBC with Differential     Status: None   Collection Time: 05/16/23 12:44 PM  Result Value Ref Range   WBC 8.3 4.0 - 10.5 K/uL   RBC 4.46 3.87 -  5.11 MIL/uL   Hemoglobin 13.7 12.0 - 15.0 g/dL   HCT 62.9 52.8 - 41.3 %   MCV 96.6 80.0 - 100.0 fL   MCH 30.7 26.0 - 34.0 pg   MCHC 31.8 30.0 - 36.0 g/dL   RDW 24.4 01.0 - 27.2 %   Platelets 252 150 - 400 K/uL   nRBC 0.0 0.0 - 0.2 %   Neutrophils Relative % 65 %   Neutro Abs 5.5 1.7 - 7.7 K/uL   Lymphocytes Relative 24 %   Lymphs Abs 2.0 0.7 - 4.0 K/uL   Monocytes Relative 6 %   Monocytes Absolute 0.5 0.1 - 1.0 K/uL   Eosinophils Relative 3 %   Eosinophils  Absolute 0.3 0.0 - 0.5 K/uL   Basophils Relative 1 %   Basophils Absolute 0.0 0.0 - 0.1 K/uL   Immature Granulocytes 1 %   Abs Immature Granulocytes 0.07 0.00 - 0.07 K/uL    Comment: Performed at [facility], [address] [place], [place] [address]  Protime-INR     Status: None   Collection Time: 05/16/23  4:10 PM  Result Value Ref Range   Prothrombin Time 14.3 11.4 - 15.2 seconds   INR 1.1 0.8 - 1.2    Comment: (NOTE) INR goal varies based on device and disease states. Performed at [facility], [address] [place], [place] [address]   APTT     Status: None   Collection Time: 05/16/23  4:10 PM  Result Value Ref Range   aPTT 27 24 - 36 seconds    Comment: Performed at [facility], [address] [place], [place] [address]  MRSA Next Gen by PCR, Nasal     Status: None   Collection Time: 05/16/23  7:38 PM   Specimen: Nasal Mucosa; Nasal Swab  Result Value Ref Range   MRSA by PCR Next Gen NOT DETECTED NOT DETECTED    Comment: (NOTE) The GeneXpert MRSA Assay (FDA approved for NASAL specimens only), is one component of a comprehensive MRSA colonization surveillance program. It is not intended to diagnose MRSA infection nor to guide or monitor treatment for MRSA infections. Test performance is not FDA approved in patients less than 25 years old. Performed at [facility], [address] [place], [place] [address]   Basic metabolic panel     Status: Abnormal   Collection Time: 05/17/23  4:10 AM  Result Value Ref Range   Sodium 140 135 - 145 mmol/L   Potassium 5.3 (H) 3.5 - 5.1 mmol/L   Chloride 105 98 - 111 mmol/L   CO2 26 22 - 32 mmol/L   Glucose, Bld 93 70 - 99 mg/dL    Comment: Glucose reference range applies only to samples taken after fasting for at least 8 hours.   BUN 45 (H) 8 - 23 mg/dL   Creatinine, Ser 6.43 (H) 0.44 - 1.00 mg/dL   Calcium 9.4 8.9 - 32.9 mg/dL   GFR, Estimated 39 (L) >60 mL/min    Comment:  (NOTE) Calculated using the CKD-EPI Creatinine Equation (2021)    Anion gap 9 5 - 15    Comment: Performed at [facility], [address] [place], [place] [address]  CBC     Status: None   Collection Time: 05/17/23  4:10 AM  Result Value Ref Range   WBC 7.8 4.0 - 10.5 K/uL   RBC 4.13 3.87 - 5.11 MIL/uL   Hemoglobin 12.7 12.0 - 15.0 g/dL  HCT 40.4 36.0 - 46.0 %   MCV 97.8 80.0 - 100.0 fL   MCH 30.8 26.0 - 34.0 pg   MCHC 31.4 30.0 - 36.0 g/dL   RDW 07.3 71.0 - 62.6 %   Platelets 230 150 - 400 K/uL   nRBC 0.0 0.0 - 0.2 %    Comment: Performed at [facility], [address], [place], [place] [address]      Assessment & Plan:  Patient advised to continue taking all her medications as such. Needs flu vaccine. Labs next visit. Problem List Items Addressed This Visit     Hyperlipidemia   Parkinson disease   GERD (gastroesophageal reflux disease)   Gout   Essential hypertension, benign - Primary   Other Visit Diagnoses     Influenza vaccine needed           Return in about 3 months (around 09/20/2023).   Total time spent: 30 minutes  Margaretann Loveless, MD  06/21/2023   This document may have been prepared by [facility] Voice Recognition software and as such may include unintentional dictation errors.

## 2023-07-04 ENCOUNTER — Ambulatory Visit: Payer: BLUE CROSS/BLUE SHIELD | Admitting: Urology

## 2023-07-11 ENCOUNTER — Encounter: Payer: Self-pay | Admitting: Urology

## 2023-07-11 ENCOUNTER — Ambulatory Visit (INDEPENDENT_AMBULATORY_CARE_PROVIDER_SITE_OTHER): Payer: Medicare Other | Admitting: Urology

## 2023-07-11 VITALS — BP 123/68 | HR 54 | Ht 63.5 in | Wt 176.0 lb

## 2023-07-11 DIAGNOSIS — N3946 Mixed incontinence: Secondary | ICD-10-CM

## 2023-07-11 MED ORDER — SOLIFENACIN SUCCINATE 5 MG PO TABS
5.0000 mg | ORAL_TABLET | Freq: Every day | ORAL | 11 refills | Status: DC
Start: 2023-07-11 — End: 2023-08-22

## 2023-07-11 NOTE — Progress Notes (Signed)
07/11/2023 11:17 AM   Yvonne Singleton 1935-03-15 [id number]  Referring provider: Margaretann Loveless, MD [address] [place],  [place] [address]  Chief Complaint  Patient presents with   Follow-up   Urinary Incontinence    HPI: I was consulted to assess the patient's urinary incontinence.  Main symptom is urge incontinence but she also has moderately severe stress incontinence.  She has mild bedwetting.  She can soak 4-5 pads a day   She voids every 2-3 hours gets up once a night.   She has Parkinson's and is currently not responding to a combination of Detrol and Myrbetriq   She has had a hysterectomy low back surgery.  She uses a chair but can transfer to the toilet   No bladder surgery     Return for pelvic examination and cystoscopy and postvoid residual on Gemtesa samples.  Stop Detrol and Myrbetriq.  Call if culture positive.  She had nausea from trospium   Today Frequency stable.  Urine culture 2023 was negative.  PVR 23 mL.  Patient cannot recall if she took the samples.  Nurses said urine is strong smelling   Narrow introitus and no stress encounter prolapse with light cough   Cystoscopy: Patient underwent flexible cystoscopy.  She had cloudy urine with a lot of white flecks and sediment.  Mucosa within normal limits with no carcinoma.  Trigone normal.  Urine sent for culture  Patient may be having ongoing bladder infections.  I thought at this stage was best to call in ciprofloxacin 250 mg twice a day for 7 days.  I will then have her come back on trimethoprim 100 mg 3 x 11.  I will then reintroduce Gemtesa samples.  Cystoscopy findings may be why she failed the other medications noted.  Percutaneous tibial nerve stimulation option.  I think we need to have reasonable treatment goals  Today Frequency stable.  Last culture positive.  Patient is on trimethoprim and clinically not infected.  Not able to give a sample.  Urge incontinence persisting.  She is going to  get a watch and try to go every hour to help her.          PMH: Past Medical History:  Diagnosis Date   Acute metabolic encephalopathy 05/12/2022   Back pain    Breast cancer [facility]) 2007   right breast lumpectomy with rad tx   Cancer [facility]) 2007   right breast   Collagen vascular disease (HCC)    Gout    Hard of hearing    Headache 05/16/2022   Hypertension    Parkinson's disease    Personal history of radiation therapy 2007   F/U right breast cancer   UTI (urinary tract infection) 05/12/2022    Surgical History: Past Surgical History:  Procedure Laterality Date   ABDOMINAL HYSTERECTOMY     ANTERIOR HIP REVISION Right 04/08/2022   Procedure: Anterior hip revision cup and femoral head;  Surgeon: Kennedy Bucker, MD;  Location: [facility] ORS;  Service: Orthopedics;  Laterality: Right;   BREAST LUMPECTOMY Right 2007   suspicious calcs, f/u with radiation   CATARACT EXTRACTION Bilateral    ESOPHAGOGASTRODUODENOSCOPY (EGD) WITH PROPOFOL N/A 05/25/2017   Procedure: ESOPHAGOGASTRODUODENOSCOPY (EGD) WITH PROPOFOL;  Surgeon: Christena Deem, MD;  Location: [facility] ENDOSCOPY;  Service: Endoscopy;  Laterality: N/A;   ESOPHAGOGASTRODUODENOSCOPY (EGD) WITH PROPOFOL N/A 05/09/2018   Procedure: ESOPHAGOGASTRODUODENOSCOPY (EGD) WITH PROPOFOL;  Surgeon: Toney Reil, MD;  Location: [facility] ENDOSCOPY;  Service: Gastroenterology;  Laterality:  N/A;   ESOPHAGOGASTRODUODENOSCOPY (EGD) WITH PROPOFOL N/A 12/09/2021   Procedure: ESOPHAGOGASTRODUODENOSCOPY (EGD) WITH PROPOFOL;  Surgeon: Toney Reil, MD;  Location: [facility] ENDOSCOPY;  Service: Gastroenterology;  Laterality: N/A;   EYE SURGERY Bilateral    HAND SURGERY     HIP CLOSED REDUCTION Left 06/24/2015   Procedure: CLOSED REDUCTION HIP;  Surgeon: Donato Heinz, MD;  Location: [facility] ORS;  Service: Orthopedics;  Laterality: Left;   HIP CLOSED REDUCTION Left 02/09/2016   Procedure: CLOSED MANIPULATION HIP;  Surgeon: Deeann Saint, MD;  Location: [facility] ORS;   Service: Orthopedics;  Laterality: Left;   HIP CLOSED REDUCTION Left 10/31/2021   Procedure: CLOSED REDUCTION HIP;  Surgeon: Christena Flake, MD;  Location: [facility] ORS;  Service: Orthopedics;  Laterality: Left;   HIP CLOSED REDUCTION Left 07/05/2022   Procedure: CLOSED REDUCTION HIP;  Surgeon: Donato Heinz, MD;  Location: [facility] ORS;  Service: Orthopedics;  Laterality: Left;   HIP CLOSED REDUCTION Left 01/17/2023   Procedure: CLOSED REDUCTION HIP;  Surgeon: Reinaldo Berber, MD;  Location: [facility] ORS;  Service: Orthopedics;  Laterality: Left;   HIP CLOSED REDUCTION Left 05/17/2023   Procedure: CLOSED REDUCTION HIP;  Surgeon: Christena Flake, MD;  Location: [facility] ORS;  Service: Orthopedics;  Laterality: Left;   HIP SURGERY     JOINT REPLACEMENT     bilateral hip   OOPHORECTOMY     TOTAL KNEE ARTHROPLASTY Bilateral     Home Medications:  Allergies as of 07/11/2023       Reactions   Streptomycin Hives   Digoxin Other (See Comments)   Other reaction(s): Unknown Note: pt had lethargy, felt bad Note: pt had lethargy, felt bad Other reaction(s): Unknown Note: pt had lethargy, felt bad Other reaction(s): Unknown Note: pt had lethargy, felt bad Note: pt had lethargy, felt bad   Trospium Nausea And Vomiting   Iodinated Contrast Media Hives        Medication List        Accurate as of July 11, 2023 11:17 AM. If you have any questions, ask your nurse or doctor.          benzonatate 100 MG capsule Commonly known as: Tessalon Perles Take 1 capsule (100 mg total) by mouth 3 (three) times daily as needed for cough.   Calcium Carbonate-Vitamin D 600-200 MG-UNIT Tabs Take 1 tablet by mouth 2 (two) times daily.   carbidopa-levodopa 50-200 MG tablet Commonly known as: SINEMET CR Take 1 tablet by mouth 3 (three) times daily.   cetirizine 5 MG tablet Commonly known as: ZYRTEC   cholecalciferol 1000 units tablet Commonly known as: VITAMIN D Take 1,000 Units by mouth daily.   furosemide  20 MG tablet Commonly known as: LASIX Take 20 mg by mouth daily.   gabapentin 300 MG capsule Commonly known as: NEURONTIN Take 1 capsule (300 mg total) by mouth 2 (two) times daily.   latanoprost 0.005 % ophthalmic solution Commonly known as: XALATAN Place 1 drop into both eyes every evening.   lisinopril 2.5 MG tablet Commonly known as: ZESTRIL Take 1 tablet (2.5 mg total) by mouth daily.   liver oil-zinc oxide 40 % ointment Commonly known as: DESITIN Apply 1 Application topically as needed for irritation.   oxybutynin 10 MG 24 hr tablet Commonly known as: DITROPAN-XL Take 1 tablet (10 mg total) by mouth daily.   oxyCODONE 5 MG immediate release tablet Commonly known as: Oxy IR/ROXICODONE Take 1 tablet (5 mg total) by mouth every 6 (six) hours  as needed for severe pain or moderate pain.   pantoprazole 40 MG tablet Commonly known as: PROTONIX Take 1 tablet (40 mg total) by mouth 2 (two) times daily before a meal.   sertraline 100 MG tablet Commonly known as: ZOLOFT Take 100 mg by mouth daily.   Systane 0.4-0.3 % Soln Generic drug: Polyethyl Glycol-Propyl Glycol Place 1 drop into both eyes daily.   trimethoprim 100 MG tablet Commonly known as: TRIMPEX Take 1 tablet (100 mg total) by mouth daily.        Allergies:  Allergies  Allergen Reactions   Streptomycin Hives   Digoxin Other (See Comments)    Other reaction(s): Unknown Note: pt had lethargy, felt bad Note: pt had lethargy, felt bad  Other reaction(s): Unknown Note: pt had lethargy, felt bad  Other reaction(s): Unknown Note: pt had lethargy, felt bad Note: pt had lethargy, felt bad   Trospium Nausea And Vomiting   Iodinated Contrast Media Hives    Family History: Family History  Problem Relation Age of Onset   Heart disease Mother    Arthritis Mother    Heart disease Father    Ulcers Father    Breast cancer Maternal Aunt     Social History:  reports that she has never smoked. She has never  been exposed to tobacco smoke. She has never used smokeless tobacco. She reports that she does not drink alcohol and does not use drugs.  ROS:                                        Physical Exam: BP 123/68   Pulse (!) 54   Ht 5' 3.5" (1.613 m)   Wt 79.8 kg   BMI 30.69 kg/m   Constitutional:  Alert and oriented, No acute distress. HEENT: [place] AT, moist mucus membranes.  Trachea midline, no masses.   Laboratory Data: Lab Results  Component Value Date   WBC 7.8 05/17/2023   HGB 12.7 05/17/2023   HCT 40.4 05/17/2023   MCV 97.8 05/17/2023   PLT 230 05/17/2023    Lab Results  Component Value Date   CREATININE 1.33 (H) 05/17/2023    No results found for: "PSA"  No results found for: "TESTOSTERONE"  No results found for: "HGBA1C"  Urinalysis    Component Value Date/Time   COLORURINE STRAW (A) 06/29/2022 1941   APPEARANCEUR Cloudy (A) 05/02/2023 1112   LABSPEC 1.005 06/29/2022 1941   LABSPEC 1.006 09/30/2014 1508   PHURINE 5.0 06/29/2022 1941   GLUCOSEU Negative 05/02/2023 1112   GLUCOSEU Negative 09/30/2014 1508   HGBUR MODERATE (A) 06/29/2022 1941   BILIRUBINUR Negative 05/02/2023 1112   BILIRUBINUR Negative 09/30/2014 1508   KETONESUR NEGATIVE 06/29/2022 1941   PROTEINUR Trace (A) 05/02/2023 1112   PROTEINUR 100 (A) 06/29/2022 1941   NITRITE Positive (A) 05/02/2023 1112   NITRITE NEGATIVE 06/29/2022 1941   LEUKOCYTESUR 3+ (A) 05/02/2023 1112   LEUKOCYTESUR NEGATIVE 06/29/2022 1941   LEUKOCYTESUR 2+ 09/30/2014 1508    Pertinent Imaging:   Assessment & Plan: Reassess on Vesicare 5 mg 30 x 11.  Stay on trimethoprim.  I did not reintroduce Gemtesa.  Discussed percutaneous tibial nerve stimulation next visit  1. Mixed incontinence  - Urinalysis, Complete   No follow-ups on file.  Martina Sinner, MD  [facility] [address], [address] [place], [place] [address] [phone]

## 2023-07-11 NOTE — Addendum Note (Signed)
Addended by: Consuella Lose on: 07/11/2023 11:41 AM   Modules accepted: Orders

## 2023-07-18 ENCOUNTER — Other Ambulatory Visit: Payer: Self-pay | Admitting: Internal Medicine

## 2023-07-18 NOTE — Addendum Note (Signed)
Addended by: Margaretann Loveless on: 07/18/2023 01:36 PM   Modules accepted: Orders

## 2023-07-21 ENCOUNTER — Ambulatory Visit: Payer: Medicare Other | Admitting: Dermatology

## 2023-08-05 ENCOUNTER — Other Ambulatory Visit: Payer: Self-pay | Admitting: Internal Medicine

## 2023-08-22 ENCOUNTER — Encounter: Payer: Self-pay | Admitting: Urology

## 2023-08-22 ENCOUNTER — Ambulatory Visit: Payer: BLUE CROSS/BLUE SHIELD | Admitting: Urology

## 2023-08-22 ENCOUNTER — Ambulatory Visit: Payer: Medicare Other | Admitting: Urology

## 2023-08-22 VITALS — Ht 63.5 in | Wt 176.0 lb

## 2023-08-22 DIAGNOSIS — N3946 Mixed incontinence: Secondary | ICD-10-CM | POA: Diagnosis not present

## 2023-08-22 LAB — MICROSCOPIC EXAMINATION: Bacteria, UA: NONE SEEN

## 2023-08-22 LAB — URINALYSIS, COMPLETE
Bilirubin, UA: NEGATIVE
Glucose, UA: NEGATIVE
Ketones, UA: NEGATIVE
Leukocytes,UA: NEGATIVE
Nitrite, UA: NEGATIVE
Protein,UA: NEGATIVE
RBC, UA: NEGATIVE
Specific Gravity, UA: 1.01 (ref 1.005–1.030)
Urobilinogen, Ur: 0.2 mg/dL (ref 0.2–1.0)
pH, UA: 5.5 (ref 5.0–7.5)

## 2023-08-22 MED ORDER — TRIMETHOPRIM 100 MG PO TABS: 100.0000 mg | ORAL_TABLET | Freq: Every day | ORAL | 11 refills | Status: AC

## 2023-08-22 NOTE — Progress Notes (Signed)
08/22/2023 3:59 PM   Christy Gentles [birth year] [id number]  Referring provider: Margaretann Loveless, MD [address] [place],  [place] [address]  Chief Complaint  Patient presents with   Follow-up   Urinary Incontinence    HPI: Reviewed note in detail.  Patient has ongoing urge incontinence.  We will stop the Vesicare and the oxybutynin.  Clinically infection free on trimethoprim.  I will keep her on this and see in a year.  She did not want percutaneous tibial nerve stimulation discussed in detail   PMH: Past Medical History:  Diagnosis Date   Acute metabolic encephalopathy 05/12/2022   Back pain    Breast cancer (HCC) 2007   right breast lumpectomy with rad tx   Cancer [facility]) 2007   right breast   Collagen vascular disease (HCC)    Gout    Hard of hearing    Headache 05/16/2022   Hypertension    Parkinson's disease (HCC)    Personal history of radiation therapy 2007   F/U right breast cancer   UTI (urinary tract infection) 05/12/2022    Surgical History: Past Surgical History:  Procedure Laterality Date   ABDOMINAL HYSTERECTOMY     ANTERIOR HIP REVISION Right 04/08/2022   Procedure: Anterior hip revision cup and femoral head;  Surgeon: Kennedy Bucker, MD;  Location: [facility] ORS;  Service: Orthopedics;  Laterality: Right;   BREAST LUMPECTOMY Right 2007   suspicious calcs, f/u with radiation   CATARACT EXTRACTION Bilateral    ESOPHAGOGASTRODUODENOSCOPY (EGD) WITH PROPOFOL N/A 05/25/2017   Procedure: ESOPHAGOGASTRODUODENOSCOPY (EGD) WITH PROPOFOL;  Surgeon: Christena Deem, MD;  Location: [facility] ENDOSCOPY;  Service: Endoscopy;  Laterality: N/A;   ESOPHAGOGASTRODUODENOSCOPY (EGD) WITH PROPOFOL N/A 05/09/2018   Procedure: ESOPHAGOGASTRODUODENOSCOPY (EGD) WITH PROPOFOL;  Surgeon: Toney Reil, MD;  Location: [facility] ENDOSCOPY;  Service: Gastroenterology;  Laterality: N/A;   ESOPHAGOGASTRODUODENOSCOPY (EGD) WITH PROPOFOL N/A 12/09/2021   Procedure:  ESOPHAGOGASTRODUODENOSCOPY (EGD) WITH PROPOFOL;  Surgeon: Toney Reil, MD;  Location: [facility] ENDOSCOPY;  Service: Gastroenterology;  Laterality: N/A;   EYE SURGERY Bilateral    HAND SURGERY     HIP CLOSED REDUCTION Left 06/24/2015   Procedure: CLOSED REDUCTION HIP;  Surgeon: Donato Heinz, MD;  Location: [facility] ORS;  Service: Orthopedics;  Laterality: Left;   HIP CLOSED REDUCTION Left 02/09/2016   Procedure: CLOSED MANIPULATION HIP;  Surgeon: Deeann Saint, MD;  Location: [facility] ORS;  Service: Orthopedics;  Laterality: Left;   HIP CLOSED REDUCTION Left 10/31/2021   Procedure: CLOSED REDUCTION HIP;  Surgeon: Christena Flake, MD;  Location: [facility] ORS;  Service: Orthopedics;  Laterality: Left;   HIP CLOSED REDUCTION Left 07/05/2022   Procedure: CLOSED REDUCTION HIP;  Surgeon: Donato Heinz, MD;  Location: [facility] ORS;  Service: Orthopedics;  Laterality: Left;   HIP CLOSED REDUCTION Left 01/17/2023   Procedure: CLOSED REDUCTION HIP;  Surgeon: Reinaldo Berber, MD;  Location: [facility] ORS;  Service: Orthopedics;  Laterality: Left;   HIP CLOSED REDUCTION Left 05/17/2023   Procedure: CLOSED REDUCTION HIP;  Surgeon: Christena Flake, MD;  Location: [facility] ORS;  Service: Orthopedics;  Laterality: Left;   HIP SURGERY     JOINT REPLACEMENT     bilateral hip   OOPHORECTOMY     TOTAL KNEE ARTHROPLASTY Bilateral     Home Medications:  Allergies as of 08/22/2023       Reactions   Streptomycin Hives   Digoxin Other (See Comments)   Other reaction(s): Unknown Note: pt had lethargy, felt bad  Note: pt had lethargy, felt bad Other reaction(s): Unknown Note: pt had lethargy, felt bad Other reaction(s): Unknown Note: pt had lethargy, felt bad Note: pt had lethargy, felt bad   Trospium Nausea And Vomiting   Citric Acid Other (See Comments)   Iodinated Contrast Media Hives        Medication List        Accurate as of August 22, 2023  3:59 PM. If you have any questions, ask your nurse or doctor.           benzonatate 100 MG capsule Commonly known as: Tessalon Perles Take 1 capsule (100 mg total) by mouth 3 (three) times daily as needed for cough.   Calcium Carbonate-Vitamin D 600-200 MG-UNIT Tabs Take 1 tablet by mouth 2 (two) times daily.   carbidopa-levodopa 50-200 MG tablet Commonly known as: SINEMET CR Take 1 tablet by mouth 3 (three) times daily.   cetirizine 5 MG tablet Commonly known as: ZYRTEC   cholecalciferol 1000 units tablet Commonly known as: VITAMIN D Take 1,000 Units by mouth daily.   furosemide 20 MG tablet Commonly known as: LASIX Take 20 mg by mouth daily.   gabapentin 300 MG capsule Commonly known as: NEURONTIN Take 1 capsule (300 mg total) by mouth 2 (two) times daily.   latanoprost 0.005 % ophthalmic solution Commonly known as: XALATAN Place 1 drop into both eyes every evening.   lisinopril 2.5 MG tablet Commonly known as: ZESTRIL Take 1 tablet (2.5 mg total) by mouth daily.   liver oil-zinc oxide 40 % ointment Commonly known as: DESITIN Apply 1 Application topically as needed for irritation.   oxybutynin 10 MG 24 hr tablet Commonly known as: DITROPAN-XL Take 1 tablet (10 mg total) by mouth daily.   oxyCODONE 5 MG immediate release tablet Commonly known as: Oxy IR/ROXICODONE Take 1 tablet (5 mg total) by mouth every 6 (six) hours as needed for severe pain or moderate pain.   pantoprazole 40 MG tablet Commonly known as: PROTONIX Take 1 tablet (40 mg total) by mouth 2 (two) times daily before a meal.   sertraline 100 MG tablet Commonly known as: ZOLOFT Take 100 mg by mouth daily.   solifenacin 5 MG tablet Commonly known as: VESICARE Take 1 tablet (5 mg total) by mouth daily.   Systane 0.4-0.3 % Soln Generic drug: Polyethyl Glycol-Propyl Glycol Place 1 drop into both eyes daily.   trimethoprim 100 MG tablet Commonly known as: TRIMPEX Take 1 tablet (100 mg total) by mouth daily.        Allergies:  Allergies  Allergen  Reactions   Streptomycin Hives   Digoxin Other (See Comments)    Other reaction(s): Unknown Note: pt had lethargy, felt bad Note: pt had lethargy, felt bad  Other reaction(s): Unknown Note: pt had lethargy, felt bad  Other reaction(s): Unknown Note: pt had lethargy, felt bad Note: pt had lethargy, felt bad   Trospium Nausea And Vomiting   Citric Acid Other (See Comments)   Iodinated Contrast Media Hives    Family History: Family History  Problem Relation Age of Onset   Heart disease Mother    Arthritis Mother    Heart disease Father    Ulcers Father    Breast cancer Maternal Aunt     Social History:  reports that she has never smoked. She has never been exposed to tobacco smoke. She has never used smokeless tobacco. She reports that she does not drink alcohol and does not use drugs.  ROS:  Physical Exam: Ht 5' 3.5" (1.613 m)   Wt 79.8 kg   BMI 30.69 kg/m   Constitutional:  Alert and oriented, No acute distress. HEENT: [place] AT, moist mucus membranes.  Trachea midline, no masses. Cardiovascular: No clubbing, cyanosis, or edema.   Laboratory Data: Lab Results  Component Value Date   WBC 7.8 05/17/2023   HGB 12.7 05/17/2023   HCT 40.4 05/17/2023   MCV 97.8 05/17/2023   PLT 230 05/17/2023    Lab Results  Component Value Date   CREATININE 1.33 (H) 05/17/2023    No results found for: "PSA"  No results found for: "TESTOSTERONE"  No results found for: "HGBA1C"  Urinalysis    Component Value Date/Time   COLORURINE STRAW (A) 06/29/2022 1941   APPEARANCEUR Cloudy (A) 05/02/2023 1112   LABSPEC 1.005 06/29/2022 1941   LABSPEC 1.006 09/30/2014 1508   PHURINE 5.0 06/29/2022 1941   GLUCOSEU Negative 05/02/2023 1112   GLUCOSEU Negative 09/30/2014 1508   HGBUR MODERATE (A) 06/29/2022 1941   BILIRUBINUR Negative 05/02/2023 1112   BILIRUBINUR Negative 09/30/2014 1508   KETONESUR NEGATIVE 06/29/2022 1941    PROTEINUR Trace (A) 05/02/2023 1112   PROTEINUR 100 (A) 06/29/2022 1941   NITRITE Positive (A) 05/02/2023 1112   NITRITE NEGATIVE 06/29/2022 1941   LEUKOCYTESUR 3+ (A) 05/02/2023 1112   LEUKOCYTESUR NEGATIVE 06/29/2022 1941   LEUKOCYTESUR 2+ 09/30/2014 1508    Pertinent Imaging:   Assessment & Plan: Reassess 1 year  1. Mixed incontinence  - Urinalysis, Complete   No follow-ups on file.  Martina Sinner, MD  [facility] [address], [address] [place], [place] [address] [phone]

## 2023-09-06 ENCOUNTER — Telehealth: Payer: Self-pay | Admitting: Internal Medicine

## 2023-09-06 NOTE — Telephone Encounter (Signed)
[facility] called asking for a hard copy of a Rx for oxycodone. Advised her that last time they requested it we told them that we do not write this medication for the patient and they should reach out to the appropriate provider.

## 2023-09-20 ENCOUNTER — Encounter: Payer: Self-pay | Admitting: Internal Medicine

## 2023-09-20 ENCOUNTER — Ambulatory Visit (INDEPENDENT_AMBULATORY_CARE_PROVIDER_SITE_OTHER): Payer: Medicare Other | Admitting: Internal Medicine

## 2023-09-20 VITALS — BP 115/60 | HR 44 | Ht 63.5 in

## 2023-09-20 DIAGNOSIS — K219 Gastro-esophageal reflux disease without esophagitis: Secondary | ICD-10-CM | POA: Diagnosis not present

## 2023-09-20 DIAGNOSIS — I10 Essential (primary) hypertension: Secondary | ICD-10-CM | POA: Diagnosis not present

## 2023-09-20 DIAGNOSIS — M1000 Idiopathic gout, unspecified site: Secondary | ICD-10-CM

## 2023-09-20 DIAGNOSIS — E782 Mixed hyperlipidemia: Secondary | ICD-10-CM

## 2023-09-20 DIAGNOSIS — G20A2 Parkinson's disease without dyskinesia, with fluctuations: Secondary | ICD-10-CM | POA: Diagnosis not present

## 2023-09-20 NOTE — Progress Notes (Signed)
Established Patient Office Visit  Subjective:  Patient ID: Yvonne Singleton, female    DOB: 11-03-1934  Age: 88 y.o. MRN: [id number]  Chief Complaint  Patient presents with   Follow-up    3 months    Patient comes in for her follow-up today.  She is under care of neurologist for Parkinson's disease and neuropathy.  Having difficulty with ambulation these days.  Mostly stays in her wheelchair.  Complains of occasional headaches and request for Tylenol as needed. Also wants to get her Shingrix vaccine, can get it from the pharmacy. Will get labs today.    No other concerns at this time.   Past Medical History:  Diagnosis Date   Acute metabolic encephalopathy 05/12/2022   Back pain    Breast cancer [facility]) 2007   right breast lumpectomy with rad tx   Cancer [facility]) 2007   right breast   Collagen vascular disease (HCC)    Gout    Hard of hearing    Headache 05/16/2022   Hypertension    Parkinson's disease (HCC)    Personal history of radiation therapy 2007   F/U right breast cancer   UTI (urinary tract infection) 05/12/2022    Past Surgical History:  Procedure Laterality Date   ABDOMINAL HYSTERECTOMY     ANTERIOR HIP REVISION Right 04/08/2022   Procedure: Anterior hip revision cup and femoral head;  Surgeon: Kennedy Bucker, MD;  Location: [facility] ORS;  Service: Orthopedics;  Laterality: Right;   BREAST LUMPECTOMY Right 2007   suspicious calcs, f/u with radiation   CATARACT EXTRACTION Bilateral    ESOPHAGOGASTRODUODENOSCOPY (EGD) WITH PROPOFOL N/A 05/25/2017   Procedure: ESOPHAGOGASTRODUODENOSCOPY (EGD) WITH PROPOFOL;  Surgeon: Christena Deem, MD;  Location: [facility] ENDOSCOPY;  Service: Endoscopy;  Laterality: N/A;   ESOPHAGOGASTRODUODENOSCOPY (EGD) WITH PROPOFOL N/A 05/09/2018   Procedure: ESOPHAGOGASTRODUODENOSCOPY (EGD) WITH PROPOFOL;  Surgeon: Toney Reil, MD;  Location: [facility] ENDOSCOPY;  Service: Gastroenterology;  Laterality: N/A;   ESOPHAGOGASTRODUODENOSCOPY (EGD)  WITH PROPOFOL N/A 12/09/2021   Procedure: ESOPHAGOGASTRODUODENOSCOPY (EGD) WITH PROPOFOL;  Surgeon: Toney Reil, MD;  Location: [facility] ENDOSCOPY;  Service: Gastroenterology;  Laterality: N/A;   EYE SURGERY Bilateral    HAND SURGERY     HIP CLOSED REDUCTION Left 06/24/2015   Procedure: CLOSED REDUCTION HIP;  Surgeon: Donato Heinz, MD;  Location: [facility] ORS;  Service: Orthopedics;  Laterality: Left;   HIP CLOSED REDUCTION Left 02/09/2016   Procedure: CLOSED MANIPULATION HIP;  Surgeon: Deeann Saint, MD;  Location: [facility] ORS;  Service: Orthopedics;  Laterality: Left;   HIP CLOSED REDUCTION Left 10/31/2021   Procedure: CLOSED REDUCTION HIP;  Surgeon: Christena Flake, MD;  Location: [facility] ORS;  Service: Orthopedics;  Laterality: Left;   HIP CLOSED REDUCTION Left 07/05/2022   Procedure: CLOSED REDUCTION HIP;  Surgeon: Donato Heinz, MD;  Location: [facility] ORS;  Service: Orthopedics;  Laterality: Left;   HIP CLOSED REDUCTION Left 01/17/2023   Procedure: CLOSED REDUCTION HIP;  Surgeon: Reinaldo Berber, MD;  Location: [facility] ORS;  Service: Orthopedics;  Laterality: Left;   HIP CLOSED REDUCTION Left 05/17/2023   Procedure: CLOSED REDUCTION HIP;  Surgeon: Christena Flake, MD;  Location: [facility] ORS;  Service: Orthopedics;  Laterality: Left;   HIP SURGERY     JOINT REPLACEMENT     bilateral hip   OOPHORECTOMY     TOTAL KNEE ARTHROPLASTY Bilateral     Social History   Socioeconomic History   Marital status: Widowed    Spouse name: Not on  file   Number of children: Not on file   Years of education: Not on file   Highest education level: Not on file  Occupational History   Not on file  Tobacco Use   Smoking status: Never    Passive exposure: Never   Smokeless tobacco: Never  Substance and Sexual Activity   Alcohol use: No    Alcohol/week: 0.0 standard drinks of alcohol   Drug use: No   Sexual activity: Not Currently  Other Topics Concern   Not on file  Social History Narrative   Not on file    Social Determinants of Health   Financial Resource Strain: Not on file  Food Insecurity: No Food Insecurity (05/16/2023)   Hunger Vital Sign    Worried About Running Out of Food in the Last Year: Never true    Ran Out of Food in the Last Year: Never true  Transportation Needs: No Transportation Needs (05/16/2023)   PRAPARE - Administrator, Civil Service (Medical): No    Lack of Transportation (Non-Medical): No  Physical Activity: Not on file  Stress: Not on file  Social Connections: Not on file  Intimate Partner Violence: Not At Risk (05/16/2023)   Humiliation, Afraid, Rape, and Kick questionnaire    Fear of Current or Ex-Partner: No    Emotionally Abused: No    Physically Abused: No    Sexually Abused: No    Family History  Problem Relation Age of Onset   Heart disease Mother    Arthritis Mother    Heart disease Father    Ulcers Father    Breast cancer Maternal Aunt     Allergies  Allergen Reactions   Streptomycin Hives   Digoxin Other (See Comments)    Other reaction(s): Unknown Note: pt had lethargy, felt bad Note: pt had lethargy, felt bad  Other reaction(s): Unknown Note: pt had lethargy, felt bad  Other reaction(s): Unknown Note: pt had lethargy, felt bad Note: pt had lethargy, felt bad   Trospium Nausea And Vomiting   Citric Acid Other (See Comments)   Iodinated Contrast Media Hives    Outpatient Medications Prior to Visit  Medication Sig   Alpha-Lipoic Acid 600 MG TABS Take 1 capsule by mouth daily.   benzonatate (TESSALON PERLES) 100 MG capsule Take 1 capsule (100 mg total) by mouth 3 (three) times daily as needed for cough.   Calcium Carbonate-Vitamin D 600-200 MG-UNIT TABS Take 1 tablet by mouth 2 (two) times daily.   carbidopa-levodopa (SINEMET CR) 50-200 MG per tablet Take 1 tablet by mouth 3 (three) times daily.   furosemide (LASIX) 20 MG tablet Take 20 mg by mouth daily.   gabapentin (NEURONTIN) 300 MG capsule Take 1 capsule (300 mg  total) by mouth 2 (two) times daily.   latanoprost (XALATAN) 0.005 % ophthalmic solution Place 1 drop into both eyes every evening.   lisinopril (ZESTRIL) 2.5 MG tablet Take 1 tablet (2.5 mg total) by mouth daily.   oxyCODONE (OXY IR/ROXICODONE) 5 MG immediate release tablet Take 1 tablet (5 mg total) by mouth every 6 (six) hours as needed for severe pain or moderate pain.   pantoprazole (PROTONIX) 40 MG tablet Take 1 tablet (40 mg total) by mouth 2 (two) times daily before a meal.   sertraline (ZOLOFT) 100 MG tablet Take 100 mg by mouth daily.   trimethoprim (TRIMPEX) 100 MG tablet Take 1 tablet (100 mg total) by mouth daily.   cetirizine (ZYRTEC) 5 MG tablet  (  Patient not taking: Reported on 09/20/2023)   cholecalciferol (VITAMIN D) 1000 units tablet Take 1,000 Units by mouth daily. (Patient not taking: Reported on 09/20/2023)   liver oil-zinc oxide (DESITIN) 40 % ointment Apply 1 Application topically as needed for irritation. (Patient not taking: Reported on 09/20/2023)   Polyethyl Glycol-Propyl Glycol (SYSTANE) 0.4-0.3 % SOLN Place 1 drop into both eyes daily. (Patient not taking: Reported on 09/20/2023)   No facility-administered medications prior to visit.    Review of Systems  Constitutional: Negative.  Negative for chills, diaphoresis, fever, malaise/fatigue and weight loss.  HENT:  Positive for hearing loss. Negative for sinus pain and tinnitus.   Eyes: Negative.   Respiratory: Negative.  Negative for cough and shortness of breath.   Cardiovascular: Negative.  Negative for chest pain, palpitations and leg swelling.  Gastrointestinal: Negative.  Negative for abdominal pain, constipation, diarrhea, heartburn, nausea and vomiting.  Genitourinary: Negative.  Negative for dysuria and flank pain.  Musculoskeletal: Negative.  Negative for joint pain and myalgias.  Skin: Negative.   Neurological:  Positive for weakness. Negative for dizziness, seizures and headaches.  Endo/Heme/Allergies:  Negative.   Psychiatric/Behavioral: Negative.  Negative for depression, memory loss and suicidal ideas. The patient is not nervous/anxious and does not have insomnia.        Objective:   BP 115/60   Pulse (!) 44   Ht 5' 3.5" (1.613 m)   SpO2 94%   BMI 30.69 kg/m   Vitals:   09/20/23 1125  BP: 115/60  Pulse: (!) 44  Height: 5' 3.5" (1.613 m)  SpO2: 94%    Physical Exam Vitals and nursing note reviewed.  Constitutional:      Appearance: Normal appearance.  HENT:     Head: Normocephalic and atraumatic.     Nose: Nose normal.     Mouth/Throat:     Mouth: Mucous membranes are moist.     Pharynx: Oropharynx is clear.  Eyes:     Conjunctiva/sclera: Conjunctivae normal.     Pupils: Pupils are equal, round, and reactive to light.  Cardiovascular:     Rate and Rhythm: Normal rate and regular rhythm.     Pulses: Normal pulses.     Heart sounds: Normal heart sounds. No murmur heard. Pulmonary:     Effort: Pulmonary effort is normal.     Breath sounds: Normal breath sounds. No wheezing.  Abdominal:     General: Bowel sounds are normal.     Palpations: Abdomen is soft.     Tenderness: There is no abdominal tenderness. There is no right CVA tenderness or left CVA tenderness.  Musculoskeletal:        General: Normal range of motion.     Cervical back: Normal range of motion.     Right lower leg: No edema.     Left lower leg: No edema.  Skin:    General: Skin is warm and dry.  Neurological:     General: No focal deficit present.     Mental Status: She is alert and oriented to person, place, and time.  Psychiatric:        Mood and Affect: Mood normal.        Behavior: Behavior normal.      No results found for any visits on 09/20/23.  Recent Results (from the past 2160 hour(s))  Urinalysis, Complete     Status: None   Collection Time: 08/22/23  3:41 PM  Result Value Ref Range   Specific Gravity, UA 1.010  1.005 - 1.030   pH, UA 5.5 5.0 - 7.5   Color, UA Yellow  Yellow   Appearance Ur Clear Clear   Leukocytes,UA Negative Negative   Protein,UA Negative Negative/Trace   Glucose, UA Negative Negative   Ketones, UA Negative Negative   RBC, UA Negative Negative   Bilirubin, UA Negative Negative   Urobilinogen, Ur 0.2 0.2 - 1.0 mg/dL   Nitrite, UA Negative Negative   Microscopic Examination See below:   Microscopic Examination     Status: None   Collection Time: 08/22/23  3:41 PM   Urine  Result Value Ref Range   WBC, UA 0-5 0 - 5 /hpf   RBC, Urine 0-2 0 - 2 /hpf   Epithelial Cells (non renal) 0-10 0 - 10 /hpf   Bacteria, UA None seen None seen/Few      Assessment & Plan:  Continue all medications as such.  Labs today. Problem List Items Addressed This Visit     Hyperlipidemia   Relevant Orders   Lipid Panel w/o Chol/HDL Ratio   Parkinson disease (HCC)   GERD (gastroesophageal reflux disease)   Gout   Essential hypertension, benign - Primary   Relevant Orders   CMP14+EGFR   CBC with Diff    Return in about 4 months (around 01/19/2024).   Total time spent: 30 minutes  Margaretann Loveless, MD  09/20/2023   This document may have been prepared by [facility] Voice Recognition software and as such may include unintentional dictation errors.

## 2023-09-21 LAB — CMP14+EGFR
ALT: 4 [IU]/L (ref 0–32)
AST: 11 [IU]/L (ref 0–40)
Albumin: 3.7 g/dL (ref 3.7–4.7)
Alkaline Phosphatase: 88 [IU]/L (ref 44–121)
BUN/Creatinine Ratio: 37 — ABNORMAL HIGH (ref 12–28)
BUN: 43 mg/dL — ABNORMAL HIGH (ref 8–27)
Bilirubin Total: 0.5 mg/dL (ref 0.0–1.2)
CO2: 24 mmol/L (ref 20–29)
Calcium: 9 mg/dL (ref 8.7–10.3)
Chloride: 103 mmol/L (ref 96–106)
Creatinine, Ser: 1.17 mg/dL — ABNORMAL HIGH (ref 0.57–1.00)
Globulin, Total: 2.9 g/dL (ref 1.5–4.5)
Glucose: 84 mg/dL (ref 70–99)
Potassium: 4.5 mmol/L (ref 3.5–5.2)
Sodium: 143 mmol/L (ref 134–144)
Total Protein: 6.6 g/dL (ref 6.0–8.5)
eGFR: 45 mL/min/{1.73_m2} — ABNORMAL LOW (ref >59)

## 2023-09-21 LAB — CBC WITH DIFFERENTIAL/PLATELET
Basophils Absolute: 0 10*3/uL (ref 0.0–0.2)
Basos: 0 %
EOS (ABSOLUTE): 0.2 10*3/uL (ref 0.0–0.4)
Eos: 2 %
Hematocrit: 36.6 % (ref 34.0–46.6)
Hemoglobin: 12 g/dL (ref 11.1–15.9)
Immature Grans (Abs): 0 10*3/uL (ref 0.0–0.1)
Immature Granulocytes: 0 %
Lymphocytes Absolute: 1.4 10*3/uL (ref 0.7–3.1)
Lymphs: 16 %
MCH: 31.8 pg (ref 26.6–33.0)
MCHC: 32.8 g/dL (ref 31.5–35.7)
MCV: 97 fL (ref 79–97)
Monocytes Absolute: 0.7 10*3/uL (ref 0.1–0.9)
Monocytes: 7 %
Neutrophils Absolute: 6.8 10*3/uL (ref 1.4–7.0)
Neutrophils: 75 %
Platelets: 240 10*3/uL (ref 150–450)
RBC: 3.77 x10E6/uL (ref 3.77–5.28)
RDW: 13 % (ref 11.7–15.4)
WBC: 9.2 10*3/uL (ref 3.4–10.8)

## 2023-09-21 LAB — LIPID PANEL W/O CHOL/HDL RATIO
Cholesterol, Total: 153 mg/dL (ref 100–199)
HDL: 37 mg/dL — ABNORMAL LOW (ref >39)
LDL Chol Calc (NIH): 100 mg/dL — ABNORMAL HIGH (ref 0–99)
Triglycerides: 81 mg/dL (ref 0–149)
VLDL Cholesterol Cal: 16 mg/dL (ref 5–40)

## 2023-09-29 ENCOUNTER — Other Ambulatory Visit: Payer: Self-pay

## 2023-10-08 ENCOUNTER — Encounter: Payer: Self-pay | Admitting: Cardiology

## 2023-10-08 MED ORDER — ONDANSETRON 4 MG PO TBDP: 4.0000 mg | ORAL_TABLET | Freq: Three times a day (TID) | ORAL | 0 refills | Status: DC | PRN

## 2023-11-15 ENCOUNTER — Other Ambulatory Visit: Payer: Self-pay | Admitting: Internal Medicine

## 2023-11-15 DIAGNOSIS — Z1231 Encounter for screening mammogram for malignant neoplasm of breast: Secondary | ICD-10-CM

## 2023-11-22 ENCOUNTER — Ambulatory Visit: Payer: BLUE CROSS/BLUE SHIELD

## 2023-11-28 ENCOUNTER — Ambulatory Visit
Admission: RE | Admit: 2023-11-28 | Discharge: 2023-11-28 | Disposition: A | Discharge status: Home / Self Care | Payer: Medicare Other | Source: Ambulatory Visit | Attending: Internal Medicine | Admitting: Internal Medicine

## 2023-11-28 DIAGNOSIS — Z1231 Encounter for screening mammogram for malignant neoplasm of breast: Secondary | ICD-10-CM | POA: Insufficient documentation

## 2023-12-19 ENCOUNTER — Ambulatory Visit: Payer: Medicare Other | Admitting: Internal Medicine

## 2023-12-23 ENCOUNTER — Encounter: Payer: Self-pay | Admitting: Internal Medicine

## 2023-12-23 ENCOUNTER — Ambulatory Visit: Payer: Medicare Other | Admitting: Internal Medicine

## 2023-12-23 VITALS — BP 130/60 | HR 58 | Ht 63.5 in

## 2023-12-23 DIAGNOSIS — K219 Gastro-esophageal reflux disease without esophagitis: Secondary | ICD-10-CM | POA: Diagnosis not present

## 2023-12-23 DIAGNOSIS — G20A2 Parkinson's disease without dyskinesia, with fluctuations: Secondary | ICD-10-CM | POA: Diagnosis not present

## 2023-12-23 DIAGNOSIS — E782 Mixed hyperlipidemia: Secondary | ICD-10-CM

## 2023-12-23 DIAGNOSIS — I10 Essential (primary) hypertension: Secondary | ICD-10-CM | POA: Diagnosis not present

## 2023-12-23 NOTE — Progress Notes (Signed)
 Established Patient Office Visit  Subjective:  Patient ID: Yvonne Singleton, female    DOB: [birth year]  Age: 89 y.o. MRN: [id number]  Chief Complaint  Patient presents with   Follow-up    3 month follow up    Patient comes in for her follow-up visit today.  She reports of feeling well and has no new complaints.  She has been taking her medications regularly although she is not too happy with the food at the nursing home.  Denies any chest pain, no shortness of breath and no palpitations.    No other concerns at this time.   Past Medical History:  Diagnosis Date   Acute metabolic encephalopathy 05/12/2022   Back pain    Breast cancer [facility]) 2007   right breast lumpectomy with rad tx   Cancer [facility]) 2007   right breast   Collagen vascular disease (HCC)    Gout    Hard of hearing    Headache 05/16/2022   Hypertension    Parkinson's disease (HCC)    Personal history of radiation therapy 2007   F/U right breast cancer   UTI (urinary tract infection) 05/12/2022    Past Surgical History:  Procedure Laterality Date   ABDOMINAL HYSTERECTOMY     ANTERIOR HIP REVISION Right 04/08/2022   Procedure: Anterior hip revision cup and femoral head;  Surgeon: Kennedy Bucker, MD;  Location: [facility] ORS;  Service: Orthopedics;  Laterality: Right;   BREAST LUMPECTOMY Right 2007   suspicious calcs, f/u with radiation   CATARACT EXTRACTION Bilateral    ESOPHAGOGASTRODUODENOSCOPY (EGD) WITH PROPOFOL N/A 05/25/2017   Procedure: ESOPHAGOGASTRODUODENOSCOPY (EGD) WITH PROPOFOL;  Surgeon: Christena Deem, MD;  Location: [facility] ENDOSCOPY;  Service: Endoscopy;  Laterality: N/A;   ESOPHAGOGASTRODUODENOSCOPY (EGD) WITH PROPOFOL N/A 05/09/2018   Procedure: ESOPHAGOGASTRODUODENOSCOPY (EGD) WITH PROPOFOL;  Surgeon: Toney Reil, MD;  Location: [facility] ENDOSCOPY;  Service: Gastroenterology;  Laterality: N/A;   ESOPHAGOGASTRODUODENOSCOPY (EGD) WITH PROPOFOL N/A 12/09/2021   Procedure:  ESOPHAGOGASTRODUODENOSCOPY (EGD) WITH PROPOFOL;  Surgeon: Toney Reil, MD;  Location: [facility] ENDOSCOPY;  Service: Gastroenterology;  Laterality: N/A;   EYE SURGERY Bilateral    HAND SURGERY     HIP CLOSED REDUCTION Left 06/24/2015   Procedure: CLOSED REDUCTION HIP;  Surgeon: Donato Heinz, MD;  Location: [facility] ORS;  Service: Orthopedics;  Laterality: Left;   HIP CLOSED REDUCTION Left 02/09/2016   Procedure: CLOSED MANIPULATION HIP;  Surgeon: Deeann Saint, MD;  Location: [facility] ORS;  Service: Orthopedics;  Laterality: Left;   HIP CLOSED REDUCTION Left 10/31/2021   Procedure: CLOSED REDUCTION HIP;  Surgeon: Christena Flake, MD;  Location: [facility] ORS;  Service: Orthopedics;  Laterality: Left;   HIP CLOSED REDUCTION Left 07/05/2022   Procedure: CLOSED REDUCTION HIP;  Surgeon: Donato Heinz, MD;  Location: [facility] ORS;  Service: Orthopedics;  Laterality: Left;   HIP CLOSED REDUCTION Left 01/17/2023   Procedure: CLOSED REDUCTION HIP;  Surgeon: Reinaldo Berber, MD;  Location: [facility] ORS;  Service: Orthopedics;  Laterality: Left;   HIP CLOSED REDUCTION Left 05/17/2023   Procedure: CLOSED REDUCTION HIP;  Surgeon: Christena Flake, MD;  Location: [facility] ORS;  Service: Orthopedics;  Laterality: Left;   HIP SURGERY     JOINT REPLACEMENT     bilateral hip   OOPHORECTOMY     TOTAL KNEE ARTHROPLASTY Bilateral     Social History   Socioeconomic History   Marital status: Widowed    Spouse name: Not on file   Number of children:  Not on file   Years of education: Not on file   Highest education level: Not on file  Occupational History   Not on file  Tobacco Use   Smoking status: Never    Passive exposure: Never   Smokeless tobacco: Never  Substance and Sexual Activity   Alcohol use: No    Alcohol/week: 0.0 standard drinks of alcohol   Drug use: No   Sexual activity: Not Currently  Other Topics Concern   Not on file  Social History Narrative   Not on file   Social Drivers of Health   Financial Resource  Strain: Not on file  Food Insecurity: No Food Insecurity (05/16/2023)   Hunger Vital Sign    Worried About Running Out of Food in the Last Year: Never true    Ran Out of Food in the Last Year: Never true  Transportation Needs: No Transportation Needs (05/16/2023)   PRAPARE - Administrator, Civil Service (Medical): No    Lack of Transportation (Non-Medical): No  Physical Activity: Not on file  Stress: Not on file  Social Connections: Not on file  Intimate Partner Violence: Not At Risk (05/16/2023)   Humiliation, Afraid, Rape, and Kick questionnaire    Fear of Current or Ex-Partner: No    Emotionally Abused: No    Physically Abused: No    Sexually Abused: No    Family History  Problem Relation Age of Onset   Heart disease Mother    Arthritis Mother    Heart disease Father    Ulcers Father    Breast cancer Maternal Aunt     Allergies  Allergen Reactions   Streptomycin Hives   Digoxin Other (See Comments)    Other reaction(s): Unknown Note: pt had lethargy, felt bad Note: pt had lethargy, felt bad  Other reaction(s): Unknown Note: pt had lethargy, felt bad  Other reaction(s): Unknown Note: pt had lethargy, felt bad Note: pt had lethargy, felt bad   Trospium Nausea And Vomiting   Citric Acid Other (See Comments)   Iodinated Contrast Media Hives    Outpatient Medications Prior to Visit  Medication Sig   Alpha-Lipoic Acid 600 MG TABS Take 1 capsule by mouth daily.   benzonatate (TESSALON PERLES) 100 MG capsule Take 1 capsule (100 mg total) by mouth 3 (three) times daily as needed for cough.   Calcium Carbonate-Vitamin D 600-200 MG-UNIT TABS Take 1 tablet by mouth 2 (two) times daily.   carbidopa-levodopa (SINEMET CR) 50-200 MG per tablet Take 1 tablet by mouth 3 (three) times daily.   furosemide (LASIX) 20 MG tablet Take 20 mg by mouth daily.   gabapentin (NEURONTIN) 300 MG capsule Take 1 capsule (300 mg total) by mouth 2 (two) times daily.   latanoprost  (XALATAN) 0.005 % ophthalmic solution Place 1 drop into both eyes every evening.   lisinopril (ZESTRIL) 2.5 MG tablet Take 1 tablet (2.5 mg total) by mouth daily.   ondansetron (ZOFRAN-ODT) 4 MG disintegrating tablet Take 1 tablet (4 mg total) by mouth every 8 (eight) hours as needed for nausea or vomiting.   oxyCODONE (OXY IR/ROXICODONE) 5 MG immediate release tablet Take 1 tablet (5 mg total) by mouth every 6 (six) hours as needed for severe pain or moderate pain.   pantoprazole (PROTONIX) 40 MG tablet Take 1 tablet (40 mg total) by mouth 2 (two) times daily before a meal. (Patient taking differently: Take 40 mg by mouth daily.)   sertraline (ZOLOFT) 100 MG tablet Take  100 mg by mouth daily.   trimethoprim (TRIMPEX) 100 MG tablet Take 1 tablet (100 mg total) by mouth daily.   cetirizine (ZYRTEC) 5 MG tablet  (Patient not taking: Reported on 09/20/2023)   cholecalciferol (VITAMIN D) 1000 units tablet Take 1,000 Units by mouth daily. (Patient not taking: Reported on 09/20/2023)   liver oil-zinc oxide (DESITIN) 40 % ointment Apply 1 Application topically as needed for irritation. (Patient not taking: Reported on 09/20/2023)   Polyethyl Glycol-Propyl Glycol (SYSTANE) 0.4-0.3 % SOLN Place 1 drop into both eyes daily. (Patient not taking: Reported on 09/20/2023)   No facility-administered medications prior to visit.    Review of Systems  Constitutional: Negative.  Negative for chills, fever, malaise/fatigue and weight loss.  HENT: Negative.  Negative for sore throat.   Eyes: Negative.   Respiratory: Negative.  Negative for cough and shortness of breath.   Cardiovascular: Negative.  Negative for chest pain, palpitations and leg swelling.  Gastrointestinal: Negative.  Negative for abdominal pain, constipation, diarrhea, heartburn, nausea and vomiting.  Genitourinary: Negative.  Negative for dysuria and flank pain.  Musculoskeletal: Negative.  Negative for joint pain and myalgias.  Skin: Negative.    Neurological: Negative.  Negative for dizziness, tingling and headaches.  Endo/Heme/Allergies: Negative.   Psychiatric/Behavioral: Negative.  Negative for depression and suicidal ideas. The patient is not nervous/anxious.        Objective:   BP 130/60   Pulse (!) 58   Ht 5' 3.5" (1.613 m)   SpO2 96%   BMI 30.69 kg/m   Vitals:   12/23/23 1134  BP: 130/60  Pulse: (!) 58  Height: 5' 3.5" (1.613 m)  Weight: Comment: unable to weigh  SpO2: 96%    Physical Exam Vitals and nursing note reviewed.  Constitutional:      Appearance: Normal appearance.  HENT:     Head: Normocephalic and atraumatic.     Nose: Nose normal.     Mouth/Throat:     Mouth: Mucous membranes are moist.     Pharynx: Oropharynx is clear.  Eyes:     Conjunctiva/sclera: Conjunctivae normal.     Pupils: Pupils are equal, round, and reactive to light.  Cardiovascular:     Rate and Rhythm: Normal rate and regular rhythm.     Pulses: Normal pulses.     Heart sounds: Normal heart sounds. No murmur heard. Pulmonary:     Effort: Pulmonary effort is normal.     Breath sounds: Normal breath sounds. No wheezing.  Abdominal:     General: Bowel sounds are normal.     Palpations: Abdomen is soft.     Tenderness: There is no abdominal tenderness. There is no right CVA tenderness or left CVA tenderness.  Musculoskeletal:        General: Normal range of motion.     Cervical back: Normal range of motion.     Right lower leg: No edema.     Left lower leg: No edema.  Skin:    General: Skin is warm and dry.  Neurological:     General: No focal deficit present.     Mental Status: She is alert and oriented to person, place, and time.  Psychiatric:        Mood and Affect: Mood normal.        Behavior: Behavior normal.      No results found for any visits on 12/23/23.  No results found for this or any previous visit (from the past 2160 hours).  Assessment & Plan:  Patient will continue all her current  medications.  Will check labs at next visit. Problem List Items Addressed This Visit     Hyperlipidemia   Parkinson disease (HCC)   GERD (gastroesophageal reflux disease)   Essential hypertension, benign - Primary    Return in about 3 months (around 03/24/2024).   Total time spent: 30 minutes  Margaretann Loveless, MD  12/23/2023   This document may have been prepared by [facility] Voice Recognition software and as such may include unintentional dictation errors.

## 2024-01-13 ENCOUNTER — Telehealth: Payer: Self-pay | Admitting: Internal Medicine

## 2024-01-13 NOTE — Telephone Encounter (Signed)
 [facility] called asking for orders to discontinue patient's Desitin cream and benzonatate 100 mg. Is this okay?

## 2024-02-20 ENCOUNTER — Other Ambulatory Visit: Payer: Self-pay | Admitting: Internal Medicine

## 2024-02-20 ENCOUNTER — Other Ambulatory Visit: Payer: Self-pay

## 2024-02-20 DIAGNOSIS — M961 Postlaminectomy syndrome, not elsewhere classified: Secondary | ICD-10-CM

## 2024-02-20 MED ORDER — GABAPENTIN 300 MG PO CAPS
300.0000 mg | ORAL_CAPSULE | Freq: Two times a day (BID) | ORAL | 1 refills | Status: DC
Start: 2024-02-20 — End: 2024-05-17

## 2024-02-20 MED ORDER — GABAPENTIN 300 MG PO CAPS: 300.0000 mg | ORAL_CAPSULE | Freq: Two times a day (BID) | ORAL | 1 refills | Status: DC

## 2024-02-24 ENCOUNTER — Telehealth: Payer: Self-pay | Admitting: Internal Medicine

## 2024-02-24 ENCOUNTER — Other Ambulatory Visit: Payer: Self-pay | Admitting: Family

## 2024-02-24 NOTE — Telephone Encounter (Signed)
 [facility] called requesting oxycontin  Rx for this patient. Advised them that Dr. Meredeth Stallion DOES NOT write this Rx for this patient and we have told them repeatedly that we are not going to send in any oxy for her. The lady from [facility] tried to tell Yvonne Singleton on the phone that we did write it in November 2024. Then she tried to tell me that Dr. Meredeth Stallion wrote it in June 2024. I advised her that none of that is documented in our system and that we are not writing it. After getting a little upset she finally said okay and hung up.

## 2024-03-06 ENCOUNTER — Emergency Department

## 2024-03-06 ENCOUNTER — Encounter: Payer: Self-pay | Admitting: Emergency Medicine

## 2024-03-06 ENCOUNTER — Observation Stay
Admission: EM | Admit: 2024-03-06 | Discharge: 2024-03-07 | Disposition: A | Discharge status: Home / Self Care | Attending: Obstetrics and Gynecology | Admitting: Obstetrics and Gynecology

## 2024-03-06 ENCOUNTER — Other Ambulatory Visit: Payer: Self-pay

## 2024-03-06 DIAGNOSIS — Y828 Other medical devices associated with adverse incidents: Secondary | ICD-10-CM | POA: Diagnosis not present

## 2024-03-06 DIAGNOSIS — Z7982 Long term (current) use of aspirin: Secondary | ICD-10-CM | POA: Insufficient documentation

## 2024-03-06 DIAGNOSIS — I10 Essential (primary) hypertension: Secondary | ICD-10-CM | POA: Insufficient documentation

## 2024-03-06 DIAGNOSIS — G20A1 Parkinson's disease without dyskinesia, without mention of fluctuations: Secondary | ICD-10-CM | POA: Insufficient documentation

## 2024-03-06 DIAGNOSIS — G629 Polyneuropathy, unspecified: Secondary | ICD-10-CM

## 2024-03-06 DIAGNOSIS — Z96643 Presence of artificial hip joint, bilateral: Secondary | ICD-10-CM | POA: Insufficient documentation

## 2024-03-06 DIAGNOSIS — G9009 Other idiopathic peripheral autonomic neuropathy: Secondary | ICD-10-CM | POA: Insufficient documentation

## 2024-03-06 DIAGNOSIS — T84021A Dislocation of internal left hip prosthesis, initial encounter: Secondary | ICD-10-CM | POA: Diagnosis not present

## 2024-03-06 DIAGNOSIS — S73035A Other anterior dislocation of left hip, initial encounter: Secondary | ICD-10-CM | POA: Diagnosis present

## 2024-03-06 DIAGNOSIS — Z96653 Presence of artificial knee joint, bilateral: Secondary | ICD-10-CM | POA: Insufficient documentation

## 2024-03-06 DIAGNOSIS — Z853 Personal history of malignant neoplasm of breast: Secondary | ICD-10-CM | POA: Diagnosis not present

## 2024-03-06 DIAGNOSIS — Z79899 Other long term (current) drug therapy: Secondary | ICD-10-CM | POA: Insufficient documentation

## 2024-03-06 DIAGNOSIS — G20C Parkinsonism, unspecified: Secondary | ICD-10-CM | POA: Insufficient documentation

## 2024-03-06 DIAGNOSIS — S73005A Unspecified dislocation of left hip, initial encounter: Secondary | ICD-10-CM | POA: Diagnosis present

## 2024-03-06 DIAGNOSIS — M25552 Pain in left hip: Secondary | ICD-10-CM | POA: Diagnosis present

## 2024-03-06 MED ORDER — PROPOFOL 10 MG/ML IV BOLUS
0.5000 mg/kg | Freq: Once | INTRAVENOUS | Status: AC
  Administered 2024-03-06: 40.2 mg via INTRAVENOUS
  Filled 2024-03-06: qty 20

## 2024-03-06 MED ORDER — ONDANSETRON HCL 4 MG/2ML IJ SOLN
INTRAMUSCULAR | Status: AC
  Filled 2024-03-06: qty 2

## 2024-03-06 MED ORDER — ONDANSETRON HCL 4 MG/2ML IJ SOLN
4.0000 mg | Freq: Once | INTRAMUSCULAR | Status: AC
  Administered 2024-03-06: 4 mg via INTRAVENOUS

## 2024-03-06 MED ORDER — MORPHINE SULFATE (PF) 4 MG/ML IV SOLN
4.0000 mg | Freq: Once | INTRAVENOUS | Status: AC
  Administered 2024-03-06: 4 mg via INTRAVENOUS

## 2024-03-06 MED ORDER — MORPHINE SULFATE (PF) 4 MG/ML IV SOLN
INTRAVENOUS | Status: AC
  Filled 2024-03-06: qty 1

## 2024-03-06 NOTE — ED Triage Notes (Signed)
 To ER from [facility] via EMS after patient bent over and reports her left hip popped out. History of similar around 2023. Left foot shortened and internally rotated, faint pulses.

## 2024-03-06 NOTE — Sedation Documentation (Signed)
 Bradler, MD unable to reduce left hip with sedation and manipulation. EDP out of room at this time to update S.Lydia Sams, MD.

## 2024-03-06 NOTE — Sedation Documentation (Signed)
 Patient identified as Yvonne Singleton by 2 RN's and MD at bedside.

## 2024-03-06 NOTE — Progress Notes (Signed)
 Full consult note and discussion with patient to follow tomorrow AM.  Called by ED staff. Imaging reviewed. Patient has recurrent R hip periprosthetic dislocation. ED staff could not reduce it.  - NPO after midnight - Hold anticoagulation - Admit to Hospitalist team. - Plan for OR for closed reduction @7am  tomorrow.

## 2024-03-06 NOTE — Sedation Documentation (Signed)
 Family (son) updated as to patient's status and son escorted back to pts room at this time.

## 2024-03-06 NOTE — ED Provider Notes (Addendum)
 [facility] Provider Note   Event Date/Time   First MD Initiated Contact with Patient 03/06/24 2113     (approximate) History  Hip Pain  HPI Lihanna Biever is a 89 y.o. female with a past medical history of bilateral hip replacements with recurrent dislocations who presents complaining of left hip pain after bending over just prior to arrival and feeling that her left hip popped out.  Patient states that she has intact sensation in his lower extremity however there is significant pain at 6/10 in severity.  Patient states any attempt to move this lower extremity causes worsening of her pain.  Patient states that this feels similar to when she has had a hip dislocation in the past. ROS: Patient currently denies any vision changes, tinnitus, difficulty speaking, facial droop, sore throat, chest pain, shortness of breath, abdominal pain, nausea/vomiting/diarrhea, dysuria, or weakness/numbness/paresthesias in any extremity   Physical Exam  Triage Vital Signs: ED Triage Vitals  Encounter Vitals Group     BP 03/06/24 2114 (!) 159/72     Systolic BP Percentile --      Diastolic BP Percentile --      Pulse Rate 03/06/24 2114 61     Resp 03/06/24 2114 17     Temp 03/06/24 2114 98.6 F (37 C)     Temp Source 03/06/24 2114 Oral     SpO2 03/06/24 2114 97 %     Weight 03/06/24 2115 177 lb 1.6 oz (80.3 kg)     Height 03/06/24 2115 5' 3.5 (1.613 m)     Head Circumference --      Peak Flow --      Pain Score 03/06/24 2114 6     Pain Loc --      Pain Education --      Exclude from Growth Chart --    Most recent vital signs: Vitals:   03/06/24 2321 03/06/24 2326  BP: (!) 113/53 (!) 126/59  Pulse: (!) 58 (!) 56  Resp: (!) 21 20  Temp: 97.8 F (36.6 C) 98.2 F (36.8 C)  SpO2: 99% 100%   General: Awake, oriented x4. CV:  Good peripheral perfusion.  Resp:  Normal effort.  Abd:  No distention.  Other:  Elderly obese Caucasian female resting comfortably in  no acute distress.  Obvious deformity to the left hip with left lower extremity shortened and internally rotated ED Results / Procedures / Treatments  Labs (all labs ordered are listed, but only abnormal results are displayed) Labs Reviewed  CBC WITH DIFFERENTIAL/PLATELET  COMPREHENSIVE METABOLIC PANEL WITH GFR   RADIOLOGY ED MD interpretation: X-ray of the left hip shows superior dislocation of the femoral prosthesis -Agree with radiology assessment Official radiology report(s): DG Pelvis 1-2 Views Result Date: 03/06/2024 CLINICAL DATA:  Left hip pain following bending over, initial encounter EXAM: PELVIS - 2 VIEW COMPARISON:  None Available. FINDINGS: There is dislocation of the femoral prosthesis from the acetabular component on the left. No acute fracture is noted. No soft tissue abnormality is seen. IMPRESSION: Superior dislocation of the femoral prosthesis. Electronically Signed   By: Oneil Devonshire M.D.   On: 03/06/2024 22:46   PROCEDURES: Critical Care performed: No .Sedation  Date/Time: 04/11/2024 7:46 AM  Performed by: Jossie Artist POUR, MD Authorized by: Jossie Artist POUR, MD   Consent:    Consent obtained:  Written   Risks discussed:  Allergic reaction, prolonged hypoxia resulting in organ damage, dysrhythmia, prolonged sedation necessitating reversal, inadequate sedation, respiratory  compromise necessitating ventilatory assistance and intubation, nausea and vomiting   Alternatives discussed:  Analgesia without sedation, anxiolysis and regional anesthesia Universal protocol:    Immediately prior to procedure, a time out was called: yes   Indications:    Procedure necessitating sedation performed by:  Physician performing sedation Pre-sedation assessment:    Time since last food or drink:  Na   NPO status caution: urgency dictates proceeding with non-ideal NPO status     ASA classification: class 2 - patient with mild systemic disease     Mallampati score:  II - soft palate,  uvula, fauces visible   Neck mobility: normal     Pre-sedation assessments completed and reviewed: airway patency, cardiovascular function, hydration status, mental status, nausea/vomiting, pain level, respiratory function and temperature   A pre-sedation assessment was completed prior to the start of the procedure Immediate pre-procedure details:    Reassessment: Patient reassessed immediately prior to procedure     Reviewed: vital signs, relevant labs/tests and NPO status     Verified: bag valve mask available, emergency equipment available, intubation equipment available, IV patency confirmed, oxygen available, reversal medications available and suction available   Procedure details (see MAR for exact dosages):    Preoxygenation:  Nasal cannula   Sedation:  Propofol    Intended level of sedation: deep   Intra-procedure monitoring:  Blood pressure monitoring, continuous capnometry, frequent LOC assessments, frequent vital sign checks, continuous pulse oximetry and cardiac monitor   Intra-procedure events: none     Total Provider sedation time (minutes):  18 Post-procedure details:   A post-sedation assessment was completed following the completion of the procedure.   Attendance: Constant attendance by certified staff until patient recovered     Recovery: Patient returned to pre-procedure baseline     Post-sedation assessments completed and reviewed: airway patency, cardiovascular function, hydration status, mental status, nausea/vomiting, pain level, respiratory function and temperature     Patient is stable for discharge or admission: yes     Procedure completion:  Tolerated well, no immediate complications .Ortho Injury Treatment  Date/Time: 04/11/2024 7:47 AM  Performed by: Jossie Artist POUR, MD Authorized by: Jossie Artist POUR, MD   Consent:    Consent obtained:  Verbal and emergent situation   Consent given by:  Patient   Alternatives discussed:  No treatment, alternative treatment,  immobilization, referral and delayed treatment Universal protocol:    Immediately prior to procedure a time out was called: yes  Injury location: hip Location details: left hip Injury type: dislocation Dislocation type: posterior Spontaneous dislocation: yes Prosthesis: yes Pre-procedure neurovascular assessment: neurovascularly intact Pre-procedure range of motion: reduced  Patient sedated: Yes. Refer to sedation procedure documentation for details of sedation. Manipulation performed: yes Reduction method: traction and counter traction, extension, abduction and external rotation Reduction successful: no Immobilization: splint Splint Applied by: ED Nurse and ED Provider Supplies used: cotton padding and Ortho-Glass Post-procedure neurovascular assessment: post-procedure neurovascularly intact    MEDICATIONS ORDERED IN ED: Medications  morphine  (PF) 4 MG/ML injection 4 mg (4 mg Intravenous Given 03/06/24 2150)  ondansetron  (ZOFRAN ) injection 4 mg (4 mg Intravenous Given 03/06/24 2150)  propofol  (DIPRIVAN ) 10 mg/mL bolus/IV push 40.2 mg (40.2 mg Intravenous Given 03/06/24 2315)   IMPRESSION / MDM / ASSESSMENT AND PLAN / ED COURSE  I reviewed the triage vital signs and the nursing notes.  The patient is on the cardiac monitor to evaluate for evidence of arrhythmia and/or significant heart rate changes. Patient's presentation is most consistent with acute presentation with potential threat to life or bodily function. Patient is a 89 year old female that presents for left hip pain Workup: XR hip Findings: Left prosthetic hip dislocation Consult: Orthopedic Surgery requested attempt at reduction at bedside.  Patient was sedated with propofol  with adequate sedation however attempts at dislocation reduction were unsuccessful.  I spoke to Dr. Tobie once more who agrees to reduce in the OR tomorrow morning  Patient does not currently demonstrate complications such  as arterial or nerve injury.  Interventions: analgesia Disposition: Admit   FINAL CLINICAL IMPRESSION(S) / ED DIAGNOSES   Final diagnoses:  Dislocation of left hip, initial encounter [facility])   Rx / DC Orders   ED Discharge Orders     None      Note:  This document was prepared using Dragon voice recognition software and may include unintentional dictation errors.   Niyonna Betsill K, MD 03/07/24 CLAUDELL    Melannie Metzner K, MD 04/11/24 [phone]

## 2024-03-06 NOTE — Sedation Documentation (Signed)
 Medication dose calculated and verified for: Propofol  by 2 RN's ( [facility] & [facility].)

## 2024-03-06 NOTE — Group Note (Deleted)
 Date:  03/06/2024 Time:  9:45 PM  Group Topic/Focus:  Wrap-Up Group:   The focus of this group is to help patients review their daily goal of treatment and discuss progress on daily workbooks.     Participation Level:  {BHH PARTICIPATION EAVWU:98119}  Participation Quality:  {BHH PARTICIPATION QUALITY:22265}  Affect:  {BHH AFFECT:22266}  Cognitive:  {BHH COGNITIVE:22267}  Insight: {BHH Insight2:20797}  Engagement in Group:  {BHH ENGAGEMENT IN JYNWG:95621}  Modes of Intervention:  {BHH MODES OF INTERVENTION:22269}  Additional Comments:  ***  Maglione,Preciosa Bundrick E 03/06/2024, 9:45 PM

## 2024-03-07 ENCOUNTER — Observation Stay: Admitting: Anesthesiology

## 2024-03-07 ENCOUNTER — Observation Stay

## 2024-03-07 ENCOUNTER — Encounter: Payer: Self-pay | Admitting: Family Medicine

## 2024-03-07 ENCOUNTER — Encounter
Admission: EM | Disposition: A | Discharge status: Home / Self Care | Payer: Self-pay | Source: Home / Self Care | Attending: Emergency Medicine

## 2024-03-07 DIAGNOSIS — G629 Polyneuropathy, unspecified: Secondary | ICD-10-CM | POA: Diagnosis not present

## 2024-03-07 DIAGNOSIS — S73005A Unspecified dislocation of left hip, initial encounter: Secondary | ICD-10-CM | POA: Diagnosis not present

## 2024-03-07 DIAGNOSIS — I10 Essential (primary) hypertension: Secondary | ICD-10-CM | POA: Diagnosis not present

## 2024-03-07 DIAGNOSIS — T84021A Dislocation of internal left hip prosthesis, initial encounter: Secondary | ICD-10-CM | POA: Diagnosis not present

## 2024-03-07 DIAGNOSIS — S73035A Other anterior dislocation of left hip, initial encounter: Secondary | ICD-10-CM | POA: Diagnosis present

## 2024-03-07 DIAGNOSIS — G20A1 Parkinson's disease without dyskinesia, without mention of fluctuations: Secondary | ICD-10-CM | POA: Insufficient documentation

## 2024-03-07 HISTORY — PX: HIP CLOSED REDUCTION: SHX983

## 2024-03-07 LAB — CBC WITH DIFFERENTIAL/PLATELET
Abs Immature Granulocytes: 0.03 10*3/uL (ref 0.00–0.07)
Basophils Absolute: 0 10*3/uL (ref 0.0–0.1)
Basophils Relative: 1 %
Eosinophils Absolute: 0.3 10*3/uL (ref 0.0–0.5)
Eosinophils Relative: 4 %
HCT: 40.2 % (ref 36.0–46.0)
Hemoglobin: 12.9 g/dL (ref 12.0–15.0)
Immature Granulocytes: 1 %
Lymphocytes Relative: 28 %
Lymphs Abs: 1.7 10*3/uL (ref 0.7–4.0)
MCH: 31.3 pg (ref 26.0–34.0)
MCHC: 32.1 g/dL (ref 30.0–36.0)
MCV: 97.6 fL (ref 80.0–100.0)
Monocytes Absolute: 0.6 10*3/uL (ref 0.1–1.0)
Monocytes Relative: 9 %
Neutro Abs: 3.7 10*3/uL (ref 1.7–7.7)
Neutrophils Relative %: 57 %
Platelets: 225 10*3/uL (ref 150–400)
RBC: 4.12 MIL/uL (ref 3.87–5.11)
RDW: 13.5 % (ref 11.5–15.5)
WBC: 6.3 10*3/uL (ref 4.0–10.5)
nRBC: 0 % (ref 0.0–0.2)

## 2024-03-07 LAB — BASIC METABOLIC PANEL WITH GFR
Anion gap: 7 (ref 5–15)
BUN: 41 mg/dL — ABNORMAL HIGH (ref 8–23)
CO2: 25 mmol/L (ref 22–32)
Calcium: 8.7 mg/dL — ABNORMAL LOW (ref 8.9–10.3)
Chloride: 109 mmol/L (ref 98–111)
Creatinine, Ser: 1.04 mg/dL — ABNORMAL HIGH (ref 0.44–1.00)
GFR, Estimated: 52 mL/min — ABNORMAL LOW (ref >60)
Glucose, Bld: 89 mg/dL (ref 70–99)
Potassium: 4.2 mmol/L (ref 3.5–5.1)
Sodium: 141 mmol/L (ref 135–145)

## 2024-03-07 LAB — COMPREHENSIVE METABOLIC PANEL WITH GFR
ALT: <5 U/L (ref 0–44)
AST: 17 U/L (ref 15–41)
Albumin: 4.2 g/dL (ref 3.5–5.0)
Alkaline Phosphatase: 71 U/L (ref 38–126)
Anion gap: 12 (ref 5–15)
BUN: 45 mg/dL — ABNORMAL HIGH (ref 8–23)
CO2: 26 mmol/L (ref 22–32)
Calcium: 9.3 mg/dL (ref 8.9–10.3)
Chloride: 100 mmol/L (ref 98–111)
Creatinine, Ser: 1.18 mg/dL — ABNORMAL HIGH (ref 0.44–1.00)
GFR, Estimated: 45 mL/min — ABNORMAL LOW (ref >60)
Glucose, Bld: 92 mg/dL (ref 70–99)
Potassium: 4.2 mmol/L (ref 3.5–5.1)
Sodium: 138 mmol/L (ref 135–145)
Total Bilirubin: 0.6 mg/dL (ref 0.0–1.2)
Total Protein: 7.3 g/dL (ref 6.5–8.1)

## 2024-03-07 LAB — SURGICAL PCR SCREEN
MRSA, PCR: NEGATIVE
Staphylococcus aureus: POSITIVE — AB

## 2024-03-07 LAB — CBC
HCT: 34.7 % — ABNORMAL LOW (ref 36.0–46.0)
Hemoglobin: 11.3 g/dL — ABNORMAL LOW (ref 12.0–15.0)
MCH: 31.8 pg (ref 26.0–34.0)
MCHC: 32.6 g/dL (ref 30.0–36.0)
MCV: 97.7 fL (ref 80.0–100.0)
Platelets: 192 10*3/uL (ref 150–400)
RBC: 3.55 MIL/uL — ABNORMAL LOW (ref 3.87–5.11)
RDW: 13.3 % (ref 11.5–15.5)
WBC: 7.2 10*3/uL (ref 4.0–10.5)
nRBC: 0 % (ref 0.0–0.2)

## 2024-03-07 SURGERY — CLOSED REDUCTION, HIP
Anesthesia: General | Site: Hip | Laterality: Left

## 2024-03-07 MED ORDER — FUROSEMIDE 20 MG PO TABS
20.0000 mg | ORAL_TABLET | Freq: Every day | ORAL | Status: DC
  Administered 2024-03-07: 20 mg via ORAL
  Filled 2024-03-07: qty 1

## 2024-03-07 MED ORDER — OYSTER SHELL CALCIUM/D3 500-5 MG-MCG PO TABS
1.0000 | ORAL_TABLET | Freq: Two times a day (BID) | ORAL | Status: DC
  Administered 2024-03-07: 1 via ORAL
  Filled 2024-03-07: qty 1

## 2024-03-07 MED ORDER — LATANOPROST 0.005 % OP SOLN
1.0000 [drp] | Freq: Every evening | OPHTHALMIC | Status: DC
  Filled 2024-03-07: qty 2.5

## 2024-03-07 MED ORDER — ENOXAPARIN SODIUM 40 MG/0.4ML IJ SOSY
0.5000 mg/kg | PREFILLED_SYRINGE | INTRAMUSCULAR | Status: DC
  Filled 2024-03-07: qty 0.4

## 2024-03-07 MED ORDER — CARBIDOPA-LEVODOPA ER 50-200 MG PO TBCR
1.0000 | EXTENDED_RELEASE_TABLET | Freq: Three times a day (TID) | ORAL | Status: DC
  Administered 2024-03-07: 1 via ORAL
  Filled 2024-03-07 (×2): qty 1

## 2024-03-07 MED ORDER — OXYCODONE HCL 5 MG PO TABS: 5.0000 mg | ORAL_TABLET | Freq: Four times a day (QID) | ORAL | Status: DC | PRN

## 2024-03-07 MED ORDER — SUCCINYLCHOLINE CHLORIDE 200 MG/10ML IV SOSY
PREFILLED_SYRINGE | INTRAVENOUS | Status: AC
  Filled 2024-03-07: qty 10

## 2024-03-07 MED ORDER — TRAZODONE HCL 50 MG PO TABS: 25.0000 mg | ORAL_TABLET | Freq: Every evening | ORAL | Status: DC | PRN

## 2024-03-07 MED ORDER — LACTATED RINGERS IV SOLN: INTRAVENOUS | Status: DC | PRN

## 2024-03-07 MED ORDER — ACETAMINOPHEN 500 MG PO TABS
1000.0000 mg | ORAL_TABLET | Freq: Once | ORAL | Status: AC
  Administered 2024-03-07: 1000 mg via ORAL

## 2024-03-07 MED ORDER — ACETAMINOPHEN 650 MG RE SUPP: 650.0000 mg | Freq: Four times a day (QID) | RECTAL | Status: DC | PRN

## 2024-03-07 MED ORDER — CHLORHEXIDINE GLUCONATE CLOTH 2 % EX PADS
6.0000 | MEDICATED_PAD | Freq: Every day | CUTANEOUS | Status: DC
  Administered 2024-03-07: 6 via TOPICAL

## 2024-03-07 MED ORDER — LIDOCAINE HCL (PF) 2 % IJ SOLN
INTRAMUSCULAR | Status: AC
  Filled 2024-03-07: qty 5

## 2024-03-07 MED ORDER — SERTRALINE HCL 50 MG PO TABS
100.0000 mg | ORAL_TABLET | Freq: Every day | ORAL | Status: DC
  Administered 2024-03-07: 100 mg via ORAL
  Filled 2024-03-07: qty 2

## 2024-03-07 MED ORDER — ONDANSETRON HCL 4 MG PO TABS: 4.0000 mg | ORAL_TABLET | Freq: Four times a day (QID) | ORAL | Status: DC | PRN

## 2024-03-07 MED ORDER — MUPIROCIN 2 % EX OINT
1.0000 | TOPICAL_OINTMENT | Freq: Two times a day (BID) | CUTANEOUS | Status: DC
  Administered 2024-03-07: 1 via NASAL
  Filled 2024-03-07: qty 22

## 2024-03-07 MED ORDER — GABAPENTIN 300 MG PO CAPS
300.0000 mg | ORAL_CAPSULE | Freq: Two times a day (BID) | ORAL | Status: DC
  Administered 2024-03-07: 300 mg via ORAL
  Filled 2024-03-07: qty 1

## 2024-03-07 MED ORDER — BENZONATATE 100 MG PO CAPS: 100.0000 mg | ORAL_CAPSULE | Freq: Three times a day (TID) | ORAL | Status: DC | PRN

## 2024-03-07 MED ORDER — ACETAMINOPHEN 500 MG PO TABS
ORAL_TABLET | ORAL | Status: AC
  Filled 2024-03-07: qty 2

## 2024-03-07 MED ORDER — SUCCINYLCHOLINE CHLORIDE 200 MG/10ML IV SOSY
PREFILLED_SYRINGE | INTRAVENOUS | Status: DC | PRN
  Administered 2024-03-07: 80 mg via INTRAVENOUS

## 2024-03-07 MED ORDER — MORPHINE SULFATE (PF) 4 MG/ML IV SOLN: 4.0000 mg | Freq: Once | INTRAVENOUS | Status: DC

## 2024-03-07 MED ORDER — MORPHINE SULFATE (PF) 2 MG/ML IV SOLN
2.0000 mg | INTRAVENOUS | Status: DC | PRN
  Administered 2024-03-07: 2 mg via INTRAVENOUS
  Filled 2024-03-07: qty 1

## 2024-03-07 MED ORDER — PANTOPRAZOLE SODIUM 40 MG PO TBEC
40.0000 mg | DELAYED_RELEASE_TABLET | Freq: Every day | ORAL | Status: DC
  Administered 2024-03-07: 40 mg via ORAL
  Filled 2024-03-07: qty 1

## 2024-03-07 MED ORDER — ONDANSETRON HCL 4 MG/2ML IJ SOLN: 4.0000 mg | Freq: Four times a day (QID) | INTRAMUSCULAR | Status: DC | PRN

## 2024-03-07 MED ORDER — PROPOFOL 1000 MG/100ML IV EMUL
INTRAVENOUS | Status: AC
  Filled 2024-03-07: qty 100

## 2024-03-07 MED ORDER — LISINOPRIL 5 MG PO TABS
2.5000 mg | ORAL_TABLET | Freq: Every day | ORAL | Status: DC
  Administered 2024-03-07: 2.5 mg via ORAL
  Filled 2024-03-07: qty 1

## 2024-03-07 MED ORDER — MAGNESIUM HYDROXIDE 400 MG/5ML PO SUSP: 30.0000 mL | Freq: Every day | ORAL | Status: DC | PRN

## 2024-03-07 MED ORDER — ACETAMINOPHEN 325 MG PO TABS: 650.0000 mg | ORAL_TABLET | Freq: Four times a day (QID) | ORAL | Status: DC | PRN

## 2024-03-07 MED ORDER — ORAL CARE MOUTH RINSE: 15.0000 mL | OROMUCOSAL | Status: DC | PRN

## 2024-03-07 MED ORDER — MIDAZOLAM HCL 2 MG/2ML IJ SOLN
INTRAMUSCULAR | Status: AC
  Filled 2024-03-07: qty 2

## 2024-03-07 MED ORDER — PROPOFOL 500 MG/50ML IV EMUL
INTRAVENOUS | Status: DC | PRN
  Administered 2024-03-07: 50 ug via INTRAVENOUS
  Administered 2024-03-07: 30 ug via INTRAVENOUS

## 2024-03-07 SURGICAL SUPPLY — 5 items
IMMBOLIZER KNEE 19 BLUE UNIV (SOFTGOODS) IMPLANT
KIT TURNOVER KIT A (KITS) ×1 IMPLANT
MANIFOLD NEPTUNE II (INSTRUMENTS) ×1 IMPLANT
TRAP FLUID SMOKE EVACUATOR (MISCELLANEOUS) ×1 IMPLANT
WATER STERILE IRR 500ML POUR (IV SOLUTION) ×1 IMPLANT

## 2024-03-07 NOTE — Assessment & Plan Note (Addendum)
-   The patient was admitted to a medical-surgical observation bed. - This is 1 of recurrent hip dislocations. - Pain management will be provided. - Orthopedic consult will be obtained. - Dr. Lydia Sams was notified about the patient. - The patient will be kept n.p.o. for OR this a.m. - She has no history of  CHF, HF, CAD, CVA, renal failure with creatinine more than 2 or diabetes mellitus on insulin.  She is considered at average risk for her age for perioperative cardiovascular events per the revised cardiac risk index.  She has no current pulmonary issues.

## 2024-03-07 NOTE — H&P (Addendum)
 [place]   PATIENT NAME: Yvonne Singleton    MR#:  [id number]  DATE OF BIRTH:  1935-07-27  DATE OF ADMISSION:  03/06/2024  PRIMARY CARE PHYSICIAN: Aisha Hove, MD   Patient is coming from: Home  REQUESTING/REFERRING PHYSICIAN: Bradler, Evan K, MD   CHIEF COMPLAINT:   Chief Complaint  Patient presents with   Hip Pain    HISTORY OF PRESENT ILLNESS:  Nemesis Rainwater is a 89 y.o. Caucasian female with medical history significant for essential hypertension, Parkinson's disease, gout, breast cancer status post right breast lumpectomy and radiotherapy, who presented to the emergency room with acute onset of left hip pain with previous history of bilateral hip replacements and recurrent dislocations.  Before presenting to the ER she felt her left hip popped out while sitting in her wheelchair and turning to one side.  She felt significant pain that is 6/10 in severity in the left hip.  It felt similar to her previous dislocations.  No nausea or vomiting or abdominal pain.  No chest pain or palpitations.  No cough or wheezing or dyspnea.  No dysuria, oliguria or hematuria or flank pain.  ED Course: When patient came to the ER, heart rate was 57 with otherwise normal vital signs.  Labs revealed a BUN of 45 and creatinine 1.18 close to baseline with unremarkable CMP and CBC.  Nasal Staph aureus swab was positive.  MRSA was negative. EKG as reviewed by me : EKG showed sinus bradycardia with rate 51 with PACs and probably progression. Imaging: Pelvic x-ray showed superior dislocation of the femoral prosthesis.  After failed attempted relocation in the ER contact was made with Dr. Lydia Sams.  He will take the patient to the OR later this AM.  The patient will be admitted to a medical observation bed for further evaluation and management. PAST MEDICAL HISTORY:   Past Medical History:  Diagnosis Date   Acute metabolic encephalopathy 05/12/2022   Back pain    Breast cancer [facility]) 2007    right breast lumpectomy with rad tx   Cancer [facility]) 2007   right breast   Collagen vascular disease (HCC)    Gout    Hard of hearing    Headache 05/16/2022   Hypertension    Parkinson's disease (HCC)    Personal history of radiation therapy 2007   F/U right breast cancer   UTI (urinary tract infection) 05/12/2022    PAST SURGICAL HISTORY:   Past Surgical History:  Procedure Laterality Date   ABDOMINAL HYSTERECTOMY     ANTERIOR HIP REVISION Right 04/08/2022   Procedure: Anterior hip revision cup and femoral head;  Surgeon: Molli Angelucci, MD;  Location: [facility] ORS;  Service: Orthopedics;  Laterality: Right;   BREAST LUMPECTOMY Right 2007   suspicious calcs, f/u with radiation   CATARACT EXTRACTION Bilateral    ESOPHAGOGASTRODUODENOSCOPY (EGD) WITH PROPOFOL  N/A 05/25/2017   Procedure: ESOPHAGOGASTRODUODENOSCOPY (EGD) WITH PROPOFOL ;  Surgeon: Deveron Fly, MD;  Location: [facility] ENDOSCOPY;  Service: Endoscopy;  Laterality: N/A;   ESOPHAGOGASTRODUODENOSCOPY (EGD) WITH PROPOFOL  N/A 05/09/2018   Procedure: ESOPHAGOGASTRODUODENOSCOPY (EGD) WITH PROPOFOL ;  Surgeon: Selena Daily, MD;  Location: [facility] ENDOSCOPY;  Service: Gastroenterology;  Laterality: N/A;   ESOPHAGOGASTRODUODENOSCOPY (EGD) WITH PROPOFOL  N/A 12/09/2021   Procedure: ESOPHAGOGASTRODUODENOSCOPY (EGD) WITH PROPOFOL ;  Surgeon: Selena Daily, MD;  Location: [facility] ENDOSCOPY;  Service: Gastroenterology;  Laterality: N/A;   EYE SURGERY Bilateral    HAND SURGERY     HIP CLOSED REDUCTION Left  06/24/2015   Procedure: CLOSED REDUCTION HIP;  Surgeon: Arlyne Lame, MD;  Location: [facility] ORS;  Service: Orthopedics;  Laterality: Left;   HIP CLOSED REDUCTION Left 02/09/2016   Procedure: CLOSED MANIPULATION HIP;  Surgeon: Marlynn Singer, MD;  Location: [facility] ORS;  Service: Orthopedics;  Laterality: Left;   HIP CLOSED REDUCTION Left 10/31/2021   Procedure: CLOSED REDUCTION HIP;  Surgeon: Elner Hahn, MD;  Location: [facility] ORS;  Service:  Orthopedics;  Laterality: Left;   HIP CLOSED REDUCTION Left 07/05/2022   Procedure: CLOSED REDUCTION HIP;  Surgeon: Arlyne Lame, MD;  Location: [facility] ORS;  Service: Orthopedics;  Laterality: Left;   HIP CLOSED REDUCTION Left 01/17/2023   Procedure: CLOSED REDUCTION HIP;  Surgeon: Venus Ginsberg, MD;  Location: [facility] ORS;  Service: Orthopedics;  Laterality: Left;   HIP CLOSED REDUCTION Left 05/17/2023   Procedure: CLOSED REDUCTION HIP;  Surgeon: Elner Hahn, MD;  Location: [facility] ORS;  Service: Orthopedics;  Laterality: Left;   HIP SURGERY     JOINT REPLACEMENT     bilateral hip   OOPHORECTOMY     TOTAL KNEE ARTHROPLASTY Bilateral     SOCIAL HISTORY:   Social History   Tobacco Use   Smoking status: Never    Passive exposure: Never   Smokeless tobacco: Never  Substance Use Topics   Alcohol use: No    Alcohol/week: 0.0 standard drinks of alcohol    FAMILY HISTORY:   Family History  Problem Relation Age of Onset   Heart disease Mother    Arthritis Mother    Heart disease Father    Ulcers Father    Breast cancer Maternal Aunt     DRUG ALLERGIES:   Allergies  Allergen Reactions   Streptomycin Hives   Digoxin Other (See Comments)    Other reaction(s): Unknown Note: pt had lethargy, felt bad Note: pt had lethargy, felt bad  Other reaction(s): Unknown Note: pt had lethargy, felt bad  Other reaction(s): Unknown Note: pt had lethargy, felt bad Note: pt had lethargy, felt bad   Trospium Nausea And Vomiting   Citric Acid Other (See Comments)   Iodinated Contrast Media Hives    REVIEW OF SYSTEMS:   ROS As per history of present illness. All pertinent systems were reviewed above. Constitutional, HEENT, cardiovascular, respiratory, GI, GU, musculoskeletal, neuro, psychiatric, endocrine, integumentary and hematologic systems were reviewed and are otherwise negative/unremarkable except for positive findings mentioned above in the HPI.   MEDICATIONS AT HOME:   Prior to  Admission medications   Medication Sig Start Date End Date Taking? Authorizing Provider  Alpha-Lipoic Acid 600 MG TABS Take 1 capsule by mouth daily.    [provider]  benzonatate  (TESSALON  PERLES) 100 MG capsule Take 1 capsule (100 mg total) by mouth 3 (three) times daily as needed for cough. 06/13/23 06/12/24  Aisha Hove, MD  Calcium  Carbonate-Vitamin D  600-200 MG-UNIT TABS Take 1 tablet by mouth 2 (two) times daily.    [provider]  carbidopa -levodopa  (SINEMET  CR) 50-200 MG per tablet Take 1 tablet by mouth 3 (three) times daily.    [provider]  cetirizine (ZYRTEC) 5 MG tablet  05/26/22   [provider]  cholecalciferol  (VITAMIN D ) 1000 units tablet Take 1,000 Units by mouth daily. Patient not taking: Reported on 09/20/2023    [provider]  furosemide  (LASIX ) 20 MG tablet Take 20 mg by mouth daily.    [provider]  gabapentin  (NEURONTIN ) 300 MG capsule Take 1  capsule (300 mg total) by mouth 2 (two) times daily. 02/20/24   Aisha Hove, MD  latanoprost  (XALATAN ) 0.005 % ophthalmic solution Place 1 drop into both eyes every evening. 02/19/22   [provider]  lisinopril  (ZESTRIL ) 2.5 MG tablet Take 1 tablet (2.5 mg total) by mouth daily. 04/14/23   Aisha Hove, MD  liver oil-zinc  oxide (DESITIN) 40 % ointment Apply 1 Application topically as needed for irritation. Patient not taking: Reported on 09/20/2023 01/30/23   Trenda Frisk, FNP  ondansetron  (ZOFRAN -ODT) 4 MG disintegrating tablet Take 1 tablet (4 mg total) by mouth every 8 (eight) hours as needed for nausea or vomiting. 10/08/23   Scoggins, Amber, NP  oxyCODONE  (OXY IR/ROXICODONE ) 5 MG immediate release tablet Take 1 tablet (5 mg total) by mouth every 6 (six) hours as needed for severe pain or moderate pain. 07/01/22   Amin, Ankit C, MD  pantoprazole  (PROTONIX ) 40 MG tablet Take 1 tablet (40 mg total) by mouth 2 (two) times daily before a meal. Patient taking  differently: Take 40 mg by mouth daily. 12/09/21 12/23/23  Selena Daily, MD  Polyethyl Glycol-Propyl Glycol (SYSTANE) 0.4-0.3 % SOLN Place 1 drop into both eyes daily. Patient not taking: Reported on 09/20/2023 05/26/22   [provider]  sertraline  (ZOLOFT ) 100 MG tablet Take 100 mg by mouth daily. 09/30/21   [provider]  trimethoprim  (TRIMPEX ) 100 MG tablet Take 1 tablet (100 mg total) by mouth daily. 08/22/23   Erman Hayward, MD      VITAL SIGNS:  Blood pressure (!) 126/54, pulse (!) 52, temperature 98.2 F (36.8 C), resp. rate 18, height 5' 3.5" (1.613 m), weight 80.3 kg, SpO2 95%.  PHYSICAL EXAMINATION:  Physical Exam  GENERAL:  89 y.o.-year-old patient lying in the bed with no acute distress.  EYES: Pupils equal, round, reactive to light and accommodation. No scleral icterus. Extraocular muscles intact.  HEENT: Head atraumatic, normocephalic. Oropharynx and nasopharynx clear.  NECK:  Supple, no jugular venous distention. No thyroid  enlargement, no tenderness.  LUNGS: Normal breath sounds bilaterally, no wheezing, rales,rhonchi or crepitation. No use of accessory muscles of respiration.  CARDIOVASCULAR: Regular rate and rhythm, S1, S2 normal. No murmurs, rubs, or gallops.  ABDOMEN: Soft, nondistended, nontender. Bowel sounds present. No organomegaly or mass.  EXTREMITIES: No pedal edema, cyanosis, or clubbing.  NEUROLOGIC: Cranial nerves II through XII are intact. Muscle strength 5/5 in all extremities. Sensation intact. Gait not checked.  PSYCHIATRIC: The patient is alert and oriented x 3.  Normal affect and good eye contact. SKIN: No obvious rash, lesion, or ulcer.  Musculoskeletal: Left lateral hip pain. LABORATORY PANEL:   CBC Recent Labs  Lab 03/07/24 0359  WBC 7.2  HGB 11.3*  HCT 34.7*  PLT 192   ------------------------------------------------------------------------------------------------------------------  Chemistries  Recent Labs   Lab 03/06/24 2127 03/07/24 0359  NA 138 141  K 4.2 4.2  CL 100 109  CO2 26 25  GLUCOSE 92 89  BUN 45* 41*  CREATININE 1.18* 1.04*  CALCIUM  9.3 8.7*  AST 17  --   ALT <5  --   ALKPHOS 71  --   BILITOT 0.6  --    ------------------------------------------------------------------------------------------------------------------  Cardiac Enzymes No results for input(s): "TROPONINI" in the last 168 hours. ------------------------------------------------------------------------------------------------------------------  RADIOLOGY:  DG Pelvis 1-2 Views Result Date: 03/06/2024 CLINICAL DATA:  Left hip pain following bending over, initial encounter EXAM: PELVIS - 2 VIEW COMPARISON:  None Available. FINDINGS: There is dislocation of the  femoral prosthesis from the acetabular component on the left. No acute fracture is noted. No soft tissue abnormality is seen. IMPRESSION: Superior dislocation of the femoral prosthesis. Electronically Signed   By: Violeta Grey M.D.   On: 03/06/2024 22:46      IMPRESSION AND PLAN:  Assessment and Plan: * Anterior dislocation of left hip, initial encounter [facility]) - The patient was admitted to a medical-surgical observation bed. - This is 1 of recurrent hip dislocations. - Pain management will be provided. - Orthopedic consult will be obtained. - Dr. Lydia Sams was notified about the patient. - The patient will be kept n.p.o. for OR this a.m. - She has no history of  CHF, HF, CAD, CVA, renal failure with creatinine more than 2 or diabetes mellitus on insulin.  She is considered at average risk for her age for perioperative cardiovascular events per the revised cardiac risk index.  She has no current pulmonary issues.  Essential hypertension - Continue antihypertensive therapy.  Peripheral neuropathy - Will continue Neurontin .  Parkinson's disease (HCC) - Will continue Sinemet  CR.   DVT prophylaxis: SCDs. Advanced Care Planning:  Code Status: Patient  is DNR only.  This was discussed with her. Family Communication:  The plan of care was discussed in details with the patient (and family). I answered all questions. The patient agreed to proceed with the above mentioned plan. Further management will depend upon hospital course. Disposition Plan: Back to previous home environment Consults called: Ortho consult All the records are reviewed and case discussed with ED provider.  Status is: Observation  I certify that at the time of admission, it is my clinical judgment that the patient will require hospital care extending less than 2 midnights.                            Dispo: The patient is from: Home              Anticipated d/c is to: Home              Patient currently is not medically stable to d/c.              Difficult to place patient: No  Virgene Griffin M.D on 03/07/2024 at 6:37 AM  [facility]   From 7 PM-7 AM, contact night-coverage [email or web address]  CC: Primary care physician; Aisha Hove, MD

## 2024-03-07 NOTE — [place] FL2 (Signed)
 [place]  MEDICAID FL2 LEVEL OF CARE FORM     IDENTIFICATION  Patient Name: Yvonne Singleton Birthdate: May 24, 1935 Sex: female Admission Date (Current Location): 03/06/2024  [facility] and [place] Number:  Chiropodist and Address:  [facility], [address] [address], [place], [place] [address]      Provider Number: [id number]  Attending Physician Name and Address:  Janeane Mealy, MD  Relative Name and Phone Number:  Glenard Lander, daughter, phoen: [phone] and Karlis Overland, son, phone: [phone]    Current Level of Care: Hospital Recommended Level of Care: Assisted Living Facility Prior Approval Number:    Date Approved/Denied: 06/27/09 PASRR Number: [id number]  Discharge Plan: Other (Comment) (ALF)    Current Diagnoses: Patient Active Problem List   Diagnosis Date Noted   Anterior dislocation of left hip, initial encounter (HCC) 03/07/2024   Parkinson's disease (HCC) 03/07/2024   Peripheral neuropathy 03/07/2024   Essential hypertension 03/07/2024   Hip dislocation, left (HCC) 05/16/2023   Chronic kidney disease, stage 3a (HCC) 05/16/2023   Hyperkalemia 05/16/2023   Pressure injury of left buttock, stage 1 02/17/2023   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) 01/17/2023   Acute metabolic encephalopathy 06/29/2022   Essential hypertension, benign 06/29/2022   Vitamin B12 deficiency 05/14/2022   Recurrent dislocation, right hip 04/06/2022   Gout 04/06/2022   Depression 04/06/2022   Paraesophageal hernia    Gastritis without bleeding    Regional enteritis of small bowel (HCC) 10/24/2018   Clostridium difficile infection 10/24/2018   Lower extremity edema 06/27/2018   Hyperlipidemia 05/30/2017   Hypertension 05/30/2017   GERD (gastroesophageal reflux disease) 05/30/2017   Parkinson disease (HCC) 10/29/2016   History of bilateral hip replacements 05/28/2015   Spinal stenosis, lumbar region, with neurogenic  claudication 05/28/2015   DDD (degenerative disc disease), lumbar 03/13/2015   Lumbar post-laminectomy syndrome 03/13/2015   Degenerative cervical disc 03/13/2015   Status post bilateral total hip replacement 03/13/2015   Sacroiliac joint dysfunction 03/13/2015   History of surgery on upper extremity 03/13/2015   DJD of shoulder 03/13/2015   Rheumatoid arthritis (HCC) 02/17/2015    Orientation RESPIRATION BLADDER Height & Weight     Self, Time, Situation, Place  Normal Continent Weight: 80.3 kg Height:  5' 3.5" (161.3 cm)  BEHAVIORAL SYMPTOMS/MOOD NEUROLOGICAL BOWEL NUTRITION STATUS      Continent Diet (Please see discharge summary)  AMBULATORY STATUS COMMUNICATION OF NEEDS Skin   Limited Assist Verbally  (Dry, non-tenting)                       Personal Care Assistance Level of Assistance  Bathing, Feeding, Dressing, Total care Bathing Assistance: Limited assistance Feeding assistance: Limited assistance Dressing Assistance: Limited assistance Total Care Assistance: Limited assistance   Functional Limitations Info  Sight, Hearing Sight Info: Impaired Hearing Info: Impaired      SPECIAL CARE FACTORS FREQUENCY                       Contractures      Additional Factors Info  Code Status, Allergies Code Status Info: DNR Allergies Info: Streptomycin  High - Hives  Digoxin  Medium - Other (See Comments) Comments  Trospium  Medium - Nausea And Vomiting  Citric Acid  No severity specified - Other (See Comments)  Iodinated Contrast Media  Low - Hives           Current Medications (03/07/2024):  This is the  current hospital active medication list Current Facility-Administered Medications  Medication Dose Route Frequency Provider Last Rate Last Admin   acetaminophen  (TYLENOL ) tablet 650 mg  650 mg Oral Q6H PRN Lorri Rota, MD       Or   acetaminophen  (TYLENOL ) suppository 650 mg  650 mg Rectal Q6H PRN Lorri Rota, MD       benzonatate  (TESSALON ) capsule 100  mg  100 mg Oral TID PRN Lorri Rota, MD       calcium -vitamin D  (OSCAL WITH D) 500-5 MG-MCG per tablet 1 tablet  1 tablet Oral BID Lorri Rota, MD   1 tablet at 03/07/24 1043   carbidopa -levodopa  (SINEMET  CR) 50-200 MG per tablet controlled release 1 tablet  1 tablet Oral TID Lorri Rota, MD   1 tablet at 03/07/24 1043   Chlorhexidine  Gluconate Cloth 2 % PADS 6 each  6 each Topical Daily Lorri Rota, MD   6 each at 03/07/24 1055   enoxaparin  (LOVENOX ) injection 40 mg  0.5 mg/kg Subcutaneous Q24H Lorri Rota, MD       furosemide  (LASIX ) tablet 20 mg  20 mg Oral Daily Lorri Rota, MD   20 mg at 03/07/24 1043   gabapentin  (NEURONTIN ) capsule 300 mg  300 mg Oral BID Lorri Rota, MD   300 mg at 03/07/24 1042   latanoprost  (XALATAN ) 0.005 % ophthalmic solution 1 drop  1 drop Both Eyes QPM Lorri Rota, MD       lisinopril  (ZESTRIL ) tablet 2.5 mg  2.5 mg Oral Daily Lorri Rota, MD   2.5 mg at 03/07/24 1042   magnesium  hydroxide (MILK OF MAGNESIA) suspension 30 mL  30 mL Oral Daily PRN Lorri Rota, MD       morphine  (PF) 2 MG/ML injection 2 mg  2 mg Intravenous Q4H PRN Lorri Rota, MD   2 mg at 03/07/24 0157   mupirocin  ointment (BACTROBAN ) 2 % 1 Application  1 Application Nasal BID Lorri Rota, MD   1 Application at 03/07/24 1044   ondansetron  (ZOFRAN ) tablet 4 mg  4 mg Oral Q6H PRN Lorri Rota, MD       Or   ondansetron  (ZOFRAN ) injection 4 mg  4 mg Intravenous Q6H PRN Lorri Rota, MD       Oral care mouth rinse  15 mL Mouth Rinse PRN Lorri Rota, MD       oxyCODONE  (Oxy IR/ROXICODONE ) immediate release tablet 5 mg  5 mg Oral Q6H PRN Lorri Rota, MD       pantoprazole  (PROTONIX ) EC tablet 40 mg  40 mg Oral Daily Lorri Rota, MD   40 mg at 03/07/24 1043   sertraline  (ZOLOFT ) tablet 100 mg  100 mg Oral Daily Lorri Rota, MD   100 mg at 03/07/24 1042   traZODone  (DESYREL ) tablet 25 mg  25 mg Oral QHS PRN Lorri Rota, MD         Discharge Medications: Please see discharge summary for  a list of discharge medications.  Relevant Imaging Results:  Relevant Lab Results:   Additional Information SSN:[id number]  Crayton Docker, RN

## 2024-03-07 NOTE — Progress Notes (Addendum)
 Pt is being transferred to the OR. Son at bedside.

## 2024-03-07 NOTE — Anesthesia Postprocedure Evaluation (Signed)
 Anesthesia Post Note  Patient: Frayda Egley  Procedure(s) Performed: CLOSED REDUCTION, HIP (Left: Hip)  Patient location during evaluation: PACU Anesthesia Type: General Level of consciousness: awake and alert Pain management: pain level controlled Vital Signs Assessment: post-procedure vital signs reviewed and stable Respiratory status: spontaneous breathing, nonlabored ventilation and respiratory function stable Cardiovascular status: blood pressure returned to baseline and stable Postop Assessment: no apparent nausea or vomiting Anesthetic complications: no   There were no known notable events for this encounter.   Last Vitals:  Vitals:   03/07/24 0815 03/07/24 0842  BP: (!) 124/55 (!) 117/58  Pulse: (!) 56 (!) 56  Resp: 15 17  Temp: 36.8 C 36.9 C  SpO2: 95% 93%    Last Pain:  Vitals:   03/07/24 0815  TempSrc:   PainSc: 8                  Baltazar Bonier

## 2024-03-07 NOTE — H&P (Signed)
 H&P reviewed. No significant changes noted.

## 2024-03-07 NOTE — Op Note (Signed)
 Operative Note    SURGERY DATE: 03/07/2024    PRE-OP DIAGNOSIS:  1. L recurrent prosthetic hip dislocation   POST-OP DIAGNOSIS:  1. L recurrent prosthetic hip dislocation   PROCEDURE(S): 1. Closed reduction of L hip   SURGEON: Cleotilde Dago, MD    ANESTHESIA: Gen   ESTIMATED BLOOD LOSS: none   TOTAL IV FLUIDS: none  INDICATION(S):  Yvonne Singleton is a 89 y.o. female with L recurrent prosthetic hip dislocation last night. She has had 4 prior hip dislocations  in the last ~2 years. She has not had any revision surgery of the L THA. After discussion of risks, benefits, and alternatives to the procedure, the patient elected to proceed.    OPERATIVE FINDINGS: L hip dislocation   OPERATIVE REPORT:   The patient was seen in the Holding Room. The patient concurred with the proposed plan, giving informed consent. The site of surgery was properly noted/marked. The patient was taken to Operating Room. A Time Out was held and the patient identity, procedure, and laterality was confirmed. After administration of adequate anesthesia, reduction maneuver consisting of hip flexion, adduction, and internal rotation was performed. A palpable clunk was felt as the hip reduced. X-rays were obtained to confirm reduction. A knee immobilizer was placed. The leg was kept in an abducted position with pillows in between the legs. The patient was awakened from anesthesia without any further complication and transferred to PACU for further recovery.     POST-OPERATIVE PLAN:  Patient will have posterior hip precautions. FFWB on operative extremity in knee immobilizer

## 2024-03-07 NOTE — Consult Note (Signed)
 ORTHOPAEDIC CONSULTATION  REQUESTING PHYSICIAN: Wouk, Haynes Lips, MD  Chief Complaint:   L hip pain  History of Present Illness: Yvonne Singleton is a 89 y.o. female with history of bilateral THA and now recurrent dislocation of L hip. Patient states she was sitting in her wheelchair last night and attempted to turn to get to a walker and felt it dislocate. XR in ED confirms this. She had revision R THA by Dr. Mozell Arias in 2023 and has not dislocated that side since. She has now had 4 dislocations of her L hip over the past 2 years and had to undergo closed reduction in OR each time. ED Staff unable to successfully perform closed reduction in ED last night.   Past Medical History:  Diagnosis Date   Acute metabolic encephalopathy 05/12/2022   Back pain    Breast cancer [facility]) 2007   right breast lumpectomy with rad tx   Cancer [facility]) 2007   right breast   Collagen vascular disease (HCC)    Gout    Hard of hearing    Headache 05/16/2022   Hypertension    Parkinson's disease (HCC)    Personal history of radiation therapy 2007   F/U right breast cancer   UTI (urinary tract infection) 05/12/2022   Past Surgical History:  Procedure Laterality Date   ABDOMINAL HYSTERECTOMY     ANTERIOR HIP REVISION Right 04/08/2022   Procedure: Anterior hip revision cup and femoral head;  Surgeon: Molli Angelucci, MD;  Location: [facility] ORS;  Service: Orthopedics;  Laterality: Right;   BREAST LUMPECTOMY Right 2007   suspicious calcs, f/u with radiation   CATARACT EXTRACTION Bilateral    ESOPHAGOGASTRODUODENOSCOPY (EGD) WITH PROPOFOL  N/A 05/25/2017   Procedure: ESOPHAGOGASTRODUODENOSCOPY (EGD) WITH PROPOFOL ;  Surgeon: Deveron Fly, MD;  Location: [facility] ENDOSCOPY;  Service: Endoscopy;  Laterality: N/A;   ESOPHAGOGASTRODUODENOSCOPY (EGD) WITH PROPOFOL  N/A 05/09/2018   Procedure: ESOPHAGOGASTRODUODENOSCOPY (EGD) WITH PROPOFOL ;  Surgeon: Selena Daily, MD;  Location: [facility] ENDOSCOPY;  Service: Gastroenterology;  Laterality: N/A;   ESOPHAGOGASTRODUODENOSCOPY (EGD) WITH PROPOFOL  N/A 12/09/2021   Procedure: ESOPHAGOGASTRODUODENOSCOPY (EGD) WITH PROPOFOL ;  Surgeon: Selena Daily, MD;  Location: [facility] ENDOSCOPY;  Service: Gastroenterology;  Laterality: N/A;   EYE SURGERY Bilateral    HAND SURGERY     HIP CLOSED REDUCTION Left 06/24/2015   Procedure: CLOSED REDUCTION HIP;  Surgeon: Arlyne Lame, MD;  Location: [facility] ORS;  Service: Orthopedics;  Laterality: Left;   HIP CLOSED REDUCTION Left 02/09/2016   Procedure: CLOSED MANIPULATION HIP;  Surgeon: Marlynn Singer, MD;  Location: [facility] ORS;  Service: Orthopedics;  Laterality: Left;   HIP CLOSED REDUCTION Left 10/31/2021   Procedure: CLOSED REDUCTION HIP;  Surgeon: Elner Hahn, MD;  Location: [facility] ORS;  Service: Orthopedics;  Laterality: Left;   HIP CLOSED REDUCTION Left 07/05/2022   Procedure: CLOSED REDUCTION HIP;  Surgeon: Arlyne Lame, MD;  Location: [facility] ORS;  Service: Orthopedics;  Laterality: Left;   HIP CLOSED REDUCTION Left 01/17/2023   Procedure: CLOSED REDUCTION HIP;  Surgeon: Venus Ginsberg, MD;  Location: [facility] ORS;  Service: Orthopedics;  Laterality: Left;   HIP CLOSED REDUCTION Left 05/17/2023   Procedure: CLOSED REDUCTION HIP;  Surgeon: Elner Hahn, MD;  Location: [facility] ORS;  Service: Orthopedics;  Laterality: Left;   HIP SURGERY     JOINT REPLACEMENT     bilateral hip   OOPHORECTOMY     TOTAL KNEE ARTHROPLASTY Bilateral    Social History   Socioeconomic History  Marital status: Widowed    Spouse name: Not on file   Number of children: Not on file   Years of education: Not on file   Highest education level: Not on file  Occupational History   Not on file  Tobacco Use   Smoking status: Never    Passive exposure: Never   Smokeless tobacco: Never  Substance and Sexual Activity   Alcohol use: No    Alcohol/week: 0.0 standard drinks of alcohol   Drug use:  No   Sexual activity: Not Currently  Other Topics Concern   Not on file  Social History Narrative   Not on file   Social Drivers of Health   Financial Resource Strain: Not on file  Food Insecurity: No Food Insecurity (03/07/2024)   Hunger Vital Sign    Worried About Running Out of Food in the Last Year: Never true    Ran Out of Food in the Last Year: Never true  Transportation Needs: No Transportation Needs (03/07/2024)   PRAPARE - Administrator, Civil Service (Medical): No    Lack of Transportation (Non-Medical): No  Physical Activity: Not on file  Stress: Not on file  Social Connections: Unknown (03/07/2024)   Social Connection and Isolation Panel [NHANES]    Frequency of Communication with Friends and Family: More than three times a week    Frequency of Social Gatherings with Friends and Family: Once a week    Attends Religious Services: 1 to 4 times per year    Active Member of [employer] or Organizations: No    Attends Engineer, structural: Not on file    Marital Status: Not on file   Family History  Problem Relation Age of Onset   Heart disease Mother    Arthritis Mother    Heart disease Father    Ulcers Father    Breast cancer Maternal Aunt    Allergies  Allergen Reactions   Streptomycin Hives   Digoxin Other (See Comments)    Other reaction(s): Unknown Note: pt had lethargy, felt bad Note: pt had lethargy, felt bad  Other reaction(s): Unknown Note: pt had lethargy, felt bad  Other reaction(s): Unknown Note: pt had lethargy, felt bad Note: pt had lethargy, felt bad   Trospium Nausea And Vomiting   Citric Acid Other (See Comments)   Iodinated Contrast Media Hives   Prior to Admission medications   Medication Sig Start Date End Date Taking? Authorizing Provider  Alpha-Lipoic Acid 600 MG TABS Take 1 capsule by mouth daily.    [provider]  benzonatate  (TESSALON  PERLES) 100 MG capsule Take 1 capsule (100 mg total) by mouth 3 (three)  times daily as needed for cough. 06/13/23 06/12/24  Aisha Hove, MD  Calcium  Carbonate-Vitamin D  600-200 MG-UNIT TABS Take 1 tablet by mouth 2 (two) times daily.    [provider]  carbidopa -levodopa  (SINEMET  CR) 50-200 MG per tablet Take 1 tablet by mouth 3 (three) times daily.    [provider]  cetirizine (ZYRTEC) 5 MG tablet  05/26/22   [provider]  cholecalciferol  (VITAMIN D ) 1000 units tablet Take 1,000 Units by mouth daily. Patient not taking: Reported on 09/20/2023    [provider]  furosemide  (LASIX ) 20 MG tablet Take 20 mg by mouth daily.    [provider]  gabapentin  (NEURONTIN ) 300 MG capsule Take 1 capsule (300 mg total) by mouth 2 (two) times daily. 02/20/24   Aisha Hove, MD  latanoprost  (  XALATAN ) 0.005 % ophthalmic solution Place 1 drop into both eyes every evening. 02/19/22   [provider]  lisinopril  (ZESTRIL ) 2.5 MG tablet Take 1 tablet (2.5 mg total) by mouth daily. 04/14/23   Aisha Hove, MD  liver oil-zinc  oxide (DESITIN) 40 % ointment Apply 1 Application topically as needed for irritation. Patient not taking: Reported on 09/20/2023 01/30/23   Trenda Frisk, FNP  ondansetron  (ZOFRAN -ODT) 4 MG disintegrating tablet Take 1 tablet (4 mg total) by mouth every 8 (eight) hours as needed for nausea or vomiting. 10/08/23   Scoggins, Hospital doctor, NP  oxyCODONE  (OXY IR/ROXICODONE ) 5 MG immediate release tablet Take 1 tablet (5 mg total) by mouth every 6 (six) hours as needed for severe pain or moderate pain. 07/01/22   Amin, Ankit C, MD  pantoprazole  (PROTONIX ) 40 MG tablet Take 1 tablet (40 mg total) by mouth 2 (two) times daily before a meal. Patient taking differently: Take 40 mg by mouth daily. 12/09/21 12/23/23  Selena Daily, MD  Polyethyl Glycol-Propyl Glycol (SYSTANE) 0.4-0.3 % SOLN Place 1 drop into both eyes daily. Patient not taking: Reported on 09/20/2023 05/26/22   [provider]  sertraline  (ZOLOFT ) 100  MG tablet Take 100 mg by mouth daily. 09/30/21   [provider]  trimethoprim  (TRIMPEX ) 100 MG tablet Take 1 tablet (100 mg total) by mouth daily. 08/22/23   Erman Hayward, MD   Recent Labs    03/06/24 2127 03/07/24 0359  WBC 6.3 7.2  HGB 12.9 11.3*  HCT 40.2 34.7*  PLT 225 192  K 4.2 4.2  CL 100 109  CO2 26 25  BUN 45* 41*  CREATININE 1.18* 1.04*  GLUCOSE 92 89  CALCIUM  9.3 8.7*   DG Pelvis 1-2 Views Result Date: 03/06/2024 CLINICAL DATA:  Left hip pain following bending over, initial encounter EXAM: PELVIS - 2 VIEW COMPARISON:  None Available. FINDINGS: There is dislocation of the femoral prosthesis from the acetabular component on the left. No acute fracture is noted. No soft tissue abnormality is seen. IMPRESSION: Superior dislocation of the femoral prosthesis. Electronically Signed   By: Violeta Grey M.D.   On: 03/06/2024 22:46     Positive ROS: All other systems have been reviewed and were otherwise negative with the exception of those mentioned in the HPI and as above.  Physical Exam: BP (!) 121/54   Pulse (!) 53   Temp (!) 97.4 F (36.3 C) (Temporal)   Resp 14   Ht 5' 3.5" (1.613 m)   Wt 80.3 kg   SpO2 93%   BMI 30.88 kg/m  General:  Alert, no acute distress Psychiatric:  Patient is competent for consent with normal mood and affect     Orthopedic Exam:  LLE: 5/5 DF/PF/EHL SILT s/s/t/sp/dp distr Foot wwp Leg IR and shortened   Imaging:  As above: Left hip periprosthetic dislocation  Assessment/Plan: 89 yo F with recurrent Left hip periprosthetic dislocation We discussed the patient's findings at length. After discussion of risks, benefits, and alternatives to procedure, the patient elected to proceed with L hip closed reduction in OR NPO until OR   Lorri Rota   03/07/2024 7:07 AM

## 2024-03-07 NOTE — TOC Transition Note (Signed)
 Transition of Care [facility]) - Discharge Note   Patient Details  Name: Yvonne Singleton MRN: [id number] Date of Birth: 1935/03/10  Transition of [facility] CM/SW Contact:  Crayton Docker, RN 03/07/2024, 3:31 PM   Clinical Narrative:     Discharge orders noted for return to ALF, [facility].  CM call to patient's daughter, Jerlene Moody, phone: [phone] regarding pending discharge and transportation. Per patient's daughter, verbalized agreement with EMS or facility transport. CM call to [facility], phone: [phone]. CM spoke to [facility] Per Pam, facility does not provide transport for pick up. Per Oralee Billow, RN can call and speak to [facility]. Call returned to patient's son, Myrtie Atkinson. Per patient's son Myrtie Atkinson, can transport patient to [facility]. CM alert to RN Trevor Fudge regarding RN report number and report to be provided to Dana.  Patient Goals and CMS Choice    Return to prior facility  Discharge Placement     Return to prior facility           Discharge Plan and Services Additional resources added to the After Visit Summary for     Social Drivers of Health (SDOH) Interventions SDOH Screenings   Food Insecurity: No Food Insecurity (03/07/2024)  Housing: Unknown (03/07/2024)  Transportation Needs: No Transportation Needs (03/07/2024)  Utilities: Not At Risk (03/07/2024)  Depression (PHQ2-9): Low Risk  (12/23/2023)  Social Connections: Unknown (03/07/2024)  Tobacco Use: Low Risk  (03/07/2024)     Readmission Risk Interventions     No data to display

## 2024-03-07 NOTE — Progress Notes (Signed)
 Occupational Therapy Evaluation Yvonne Singleton Details Name: Yvonne Singleton MRN: [id number] DOB: 07/01/1935 Today's Date: 03/07/2024   History of Present Illness   Yvonne Singleton is a 89 y.o. Caucasian female with medical history significant for essential hypertension, Parkinson's disease, gout, breast cancer status post right breast lumpectomy and radiotherapy, who presented to the emergency room with acute onset of left hip pain with previous history of bilateral hip replacements and recurrent dislocations.  Before presenting to the ER she felt her left hip popped out while sitting in her wheelchair and turning to one side.  She felt significant pain that is 6/10 in severity in the left hip. Yvonne Singleton is s/p closed reduction. KI, TDWB, Flat foot     Clinical Impressions Yvonne Singleton was seen for OT evaluation this date. Prior to hospital admission, pt was IND/MOD I in ADLs. Pt lives in [facility]. Pt presents to acute OT demonstrating impaired ADL performance and functional mobility 2/2 generalized weakness. (See OT problem list for additional functional deficits). Pt currently requires MAX A for donning socks EOB, MIN A x2 for simulated BSC t/f, SETUP for eating.  Pt educated on TDWB pcns. Pt would benefit from skilled OT services to address noted impairments and functional limitations (see below for any additional details) in order to maximize safety and independence while minimizing falls risk and caregiver burden. Anticipate the need for follow up OT services upon acute hospital DC.      If plan is discharge home, recommend the following:   A lot of help with walking and/or transfers;A lot of help with bathing/dressing/bathroom     Functional Status Assessment   Yvonne Singleton has had a recent decline in their functional status and demonstrates the ability to make significant improvements in function in a reasonable and predictable amount of time.     Equipment  Recommendations   None recommended by OT     Recommendations for Other Services         Precautions/Restrictions   Precautions Precautions: Posterior Hip Restrictions Weight Bearing Restrictions Per Provider Order: Yes LLE Weight Bearing Per Provider Order: Touchdown weight bearing Other Position/Activity Restrictions: Flat foot with KI     Mobility Bed Mobility Overal bed mobility: Needs Assistance Bed Mobility: Supine to Sit     Supine to sit: HOB elevated, Used rails, Contact guard     General bed mobility comments: increased time needed    Transfers Overall transfer level: Needs assistance Equipment used: Rolling walker (2 wheels) Transfers: Sit to/from Stand Sit to Stand: Min assist, +2 physical assistance, From elevated surface           General transfer comment: MIN A x2 for sit to stand      Balance Overall balance assessment: Needs assistance Sitting-balance support: Feet supported Sitting balance-Leahy Scale: Good     Standing balance support: Bilateral upper extremity supported, Reliant on assistive device for balance Standing balance-Leahy Scale: Fair                             ADL either performed or assessed with clinical judgement   ADL Overall ADL's : Needs assistance/impaired                                       General ADL Comments: MAX A for donning socks EOB, MIN A x2 for simulated BSC t/f,  SETUP for eating     Vision         Perception         Praxis         Pertinent Vitals/Pain Pain Assessment Pain Assessment: 0-10 Pain Score: 5  Pain Location: L hip/back Pain Descriptors / Indicators: Dull Pain Intervention(s): Limited activity within Yvonne Singleton's tolerance     Extremity/Trunk Assessment Upper Extremity Assessment Upper Extremity Assessment: Generalized weakness   Lower Extremity Assessment Lower Extremity Assessment: Generalized weakness LLE Coordination: decreased gross  motor       Communication Communication Communication: Impaired Factors Affecting Communication: Hearing impaired   Cognition Arousal: Alert Behavior During Therapy: WFL for tasks assessed/performed               OT - Cognition Comments: A/O x 4                 Following commands: Intact       Cueing  General Comments   Cueing Techniques: Verbal cues      Exercises     Shoulder Instructions      Home Living Family/Yvonne Singleton expects to be discharged to:: Other (Comment)                                        Prior Functioning/Environment Prior Level of Function : Independent/Modified Independent             Mobility Comments: only walks with assistance, can normally pivot into wheelchair independently and can propel self in wheelchair. ADLs Comments: IND in dressing, eating, toileting    OT Problem List: Decreased strength;Decreased activity tolerance;Impaired balance (sitting and/or standing)   OT Treatment/Interventions: Self-care/ADL training;Yvonne Singleton/family education      OT Goals(Current goals can be found in the care plan section)   Acute Rehab OT Goals Yvonne Singleton Stated Goal: to go home OT Goal Formulation: With Yvonne Singleton Time For Goal Achievement: 03/21/24 Potential to Achieve Goals: Good ADL Goals Pt Will Perform Grooming: with modified independence;sitting Pt Will Perform Lower Body Dressing: with min assist;sitting/lateral leans;sit to/from stand Pt Will Transfer to Toilet: with supervision;stand pivot transfer;regular height toilet   OT Frequency:  Min 2X/week    Co-evaluation PT/OT/SLP Co-Evaluation/Treatment: Yes Reason for Co-Treatment: To address functional/ADL transfers PT goals addressed during session: Mobility/safety with mobility;Balance;Proper use of DME OT goals addressed during session: ADL's and self-care      AM-PAC OT "6 Clicks" Daily Activity     Outcome Measure Help from another person eating  meals?: None Help from another person taking care of personal grooming?: A Little Help from another person toileting, which includes using toliet, bedpan, or urinal?: A Lot Help from another person bathing (including washing, rinsing, drying)?: A Lot Help from another person to put on and taking off regular upper body clothing?: A Little Help from another person to put on and taking off regular lower body clothing?: A Lot 6 Click Score: 16   End of Session Equipment Utilized During Treatment: Gait belt;Rolling walker (2 wheels) Nurse Communication: Mobility status;Precautions;Weight bearing status  Activity Tolerance: Yvonne Singleton tolerated treatment well Yvonne Singleton left: in chair;with call bell/phone within reach;with chair alarm set  OT Visit Diagnosis: Unsteadiness on feet (R26.81);Other abnormalities of gait and mobility (R26.89);Muscle weakness (generalized) (M62.81)                Time: 9562-1308 OT Time Calculation (min): 19 min Charges:  OT  General Charges $OT Visit: 1 Visit OT Evaluation $OT Eval Low Complexity: 1 Low OT Treatments $Self Care/Home Management : 8-22 mins  Stevenson Elbe, Student OT   [employer] 03/07/2024, 2:07 PM

## 2024-03-07 NOTE — Plan of Care (Signed)
  Problem: Education: Goal: Knowledge of General Education information will improve Description: Including pain rating scale, medication(s)/side effects and non-pharmacologic comfort measures Outcome: Progressing   Problem: Clinical Measurements: Goal: Cardiovascular complication will be avoided Outcome: Progressing   Problem: Coping: Goal: Level of anxiety will decrease Outcome: Progressing   Problem: Elimination: Goal: Will not experience complications related to bowel motility Outcome: Progressing Goal: Will not experience complications related to urinary retention Outcome: Progressing   Problem: Pain Managment: Goal: General experience of comfort will improve and/or be controlled Outcome: Progressing

## 2024-03-07 NOTE — Transfer of Care (Signed)
 Immediate Anesthesia Transfer of Care Note  Patient: Yvonne Singleton  Procedure(s) Performed: CLOSED REDUCTION, HIP (Left: Hip)  Patient Location: PACU  Anesthesia Type:General  Level of Consciousness: awake  Airway & Oxygen Therapy: Patient Spontanous Breathing  Post-op Assessment: Report given to RN and Post -op Vital signs reviewed and stable  Post vital signs: Reviewed and stable  Last Vitals:  Vitals Value Taken Time  BP 121/58 03/07/24 0750  Temp    Pulse 64 03/07/24 0754  Resp 16 03/07/24 0754  SpO2 95 % 03/07/24 0754  Vitals shown include unfiled device data.  Last Pain:  Vitals:   03/07/24 0655  TempSrc: Temporal  PainSc: 8          Complications: There were no known notable events for this encounter.

## 2024-03-07 NOTE — Progress Notes (Signed)
 The writer called [facility] to give report for pt. Answering machine picked up and message was left for Yvonne Singleton to call me back for report. The pt will be transported by son. Pt has some c/o pain, a prn medication was rendered. Will continue to monitor.

## 2024-03-07 NOTE — Assessment & Plan Note (Signed)
Will continue Neurontin.

## 2024-03-07 NOTE — Assessment & Plan Note (Signed)
 -  Continue antihypertensive therapy ?

## 2024-03-07 NOTE — Assessment & Plan Note (Signed)
-   Will continue Sinemet  CR.

## 2024-03-07 NOTE — Evaluation (Signed)
 Physical Therapy Evaluation Patient Details Name: Yvonne Singleton MRN: [id number] DOB: 01/16/1935 Today's Date: 03/07/2024  History of Present Illness  Yvonne Singleton is a 89 y.o. Caucasian female with medical history significant for essential hypertension, Parkinson's disease, gout, breast cancer status post right breast lumpectomy and radiotherapy, who presented to the emergency room with acute onset of left hip pain with previous history of bilateral hip replacements and recurrent dislocations.  Before presenting to the ER she felt her left hip popped out while sitting in her wheelchair and turning to one side.  She felt significant pain that is 6/10 in severity in the left hip. Patient is s/p closed reduction. KI, TDWB, Flat foot  Clinical Impression  Patient received in bed, she requires min A for bed mobility. Transfers with min +2 assist from elevated bed. Patient is able to ambulate ~4 feet with RW to recliner. Cues needed for WB status. She does not ambulate much at baseline and requires assistance. Patient will continue to benefit from skilled PT to improve safety and independence with mobility.           If plan is discharge home, recommend the following: A little help with walking and/or transfers;A little help with bathing/dressing/bathroom;Assist for transportation   Can travel by private vehicle        Equipment Recommendations None recommended by PT  Recommendations for Other Services       Functional Status Assessment Patient has had a recent decline in their functional status and demonstrates the ability to make significant improvements in function in a reasonable and predictable amount of time.     Precautions / Restrictions Precautions Precautions: Posterior Hip Restrictions Weight Bearing Restrictions Per Provider Order: Yes LLE Weight Bearing Per Provider Order: Touchdown weight bearing Other Position/Activity Restrictions: Flat foot with KI       Mobility  Bed Mobility Overal bed mobility: Needs Assistance Bed Mobility: Supine to Sit     Supine to sit: Min assist, HOB elevated, Used rails     General bed mobility comments: increased time needed    Transfers Overall transfer level: Needs assistance Equipment used: Rolling walker (2 wheels) Transfers: Sit to/from Stand Sit to Stand: Min assist, +2 physical assistance, From elevated surface                Ambulation/Gait Ambulation/Gait assistance: Contact guard assist Gait Distance (Feet): 4 Feet Assistive device: Rolling walker (2 wheels) Gait Pattern/deviations: Step-to pattern, Decreased step length - right, Decreased weight shift to left Gait velocity: decr     General Gait Details: patient is a minimal ambulator at baseline, she was able to ambulate ~4 feet to recliner with cga. Cues for safety, WB  Stairs            Wheelchair Mobility     Tilt Bed    Modified Rankin (Stroke Patients Only)       Balance Overall balance assessment: Needs assistance Sitting-balance support: Feet supported, Bilateral upper extremity supported Sitting balance-Leahy Scale: Fair     Standing balance support: Bilateral upper extremity supported, During functional activity Standing balance-Leahy Scale: Fair                               Pertinent Vitals/Pain Pain Assessment Pain Assessment: No/denies pain    Home Living Family/patient expects to be discharged to:: Other (Comment) (patient is from [facility] independent living)  Prior Function Prior Level of Function : Independent/Modified Independent             Mobility Comments: only walks with assistance, can normally pivot into wheelchair independently and can propel self in wheelchair.       Extremity/Trunk Assessment   Upper Extremity Assessment Upper Extremity Assessment: Defer to OT evaluation    Lower Extremity Assessment Lower  Extremity Assessment: Generalized weakness;LLE deficits/detail LLE Coordination: decreased gross motor       Communication   Communication Communication: Impaired Factors Affecting Communication: Hearing impaired    Cognition Arousal: Alert Behavior During Therapy: WFL for tasks assessed/performed   PT - Cognitive impairments: No apparent impairments                         Following commands: Intact       Cueing Cueing Techniques: Verbal cues     General Comments      Exercises     Assessment/Plan    PT Assessment Patient needs continued PT services  PT Problem List Decreased strength;Decreased activity tolerance;Decreased balance;Decreased mobility;Decreased safety awareness       PT Treatment Interventions DME instruction;Gait training;Functional mobility training;Therapeutic activities;Therapeutic exercise;Balance training;Neuromuscular re-education;Patient/family education    PT Goals (Current goals can be found in the Care Plan section)  Acute Rehab PT Goals Patient Stated Goal: return to [facility] PT Goal Formulation: With patient Time For Goal Achievement: 03/14/24 Potential to Achieve Goals: Good    Frequency Min 2X/week     Co-evaluation PT/OT/SLP Co-Evaluation/Treatment: Yes   PT goals addressed during session: Mobility/safety with mobility;Balance;Proper use of DME         AM-PAC PT "6 Clicks" Mobility  Outcome Measure Help needed turning from your back to your side while in a flat bed without using bedrails?: A Little Help needed moving from lying on your back to sitting on the side of a flat bed without using bedrails?: A Little Help needed moving to and from a bed to a chair (including a wheelchair)?: A Little Help needed standing up from a chair using your arms (e.g., wheelchair or bedside chair)?: A Little Help needed to walk in hospital room?: A Lot Help needed climbing 3-5 steps with a railing? : Total 6 Click Score:  15    End of Session Equipment Utilized During Treatment: Gait belt Activity Tolerance: Patient limited by fatigue Patient left: in chair;with call bell/phone within reach;with chair alarm set Nurse Communication: Mobility status PT Visit Diagnosis: Unsteadiness on feet (R26.81);Other abnormalities of gait and mobility (R26.89);Difficulty in walking, not elsewhere classified (R26.2)    Time: 1315-1330 PT Time Calculation (min) (ACUTE ONLY): 15 min   Charges:   PT Evaluation $PT Eval Moderate Complexity: 1 Mod   PT General Charges $$ ACUTE PT VISIT: 1 Visit         Adrienna Karis, PT, GCS 03/07/24,1:43 PM

## 2024-03-07 NOTE — Discharge Summary (Signed)
 Yvonne Singleton ZOX:[id number] DOB: Mar 30, 1935 DOA: 03/06/2024  PCP: Aisha Hove, MD  Admit date: 03/06/2024 Discharge date: 03/07/2024  Time spent: 35 minutes  Recommendations for Outpatient Follow-up:  Close f/u with Dr. Aubry Blase (orthopedics)     Discharge Diagnoses:  Principal Problem:   Anterior dislocation of left hip, initial encounter [facility]) Active Problems:   Hip dislocation, left (HCC)   Parkinson's disease (HCC)   Peripheral neuropathy   Essential hypertension   Discharge Condition: stable  Diet recommendation: regular  Filed Weights   03/06/24 2115  Weight: 80.3 kg    History of present illness:  From admission h and p Yvonne Singleton is a 89 y.o. Caucasian female with medical history significant for essential hypertension, Parkinson's disease, gout, breast cancer status post right breast lumpectomy and radiotherapy, who presented to the emergency room with acute onset of left hip pain with previous history of bilateral hip replacements and recurrent dislocations.  Before presenting to the ER she felt her left hip popped out while sitting in her wheelchair and turning to one side.  She felt significant pain that is 6/10 in severity in the left hip.  It felt similar to her previous dislocations.  No nausea or vomiting or abdominal pain.  No chest pain or palpitations.  No cough or wheezing or dyspnea.  No dysuria, oliguria or hematuria or flank pain.   Hospital Course:  Patient with a history of bilateral hip replacement, history recurrent hip dislocation, presenting with left hip dislocation that occurred as she rotated herself in her wheelchair. Dr. Lydia Sams of orthopedics was consulted and successfully reduced the hip in the OR. Pain is controlled and PT has cleared the patient for discharge - Dr. Lydia Sams advises close follow-up with patient's orthopedist, Dr. Aubry Blase.  Procedures: Closed reduction of L hip   Consultations: orthopedics  Discharge  Exam: Vitals:   03/07/24 0842 03/07/24 1424  BP: (!) 117/58 (!) 110/44  Pulse: (!) 56 (!) 103  Resp: 17 16  Temp: 98.4 F (36.9 C) 99.3 F (37.4 C)  SpO2: 93% 95%    General: NAD Cardiovascular: RRR Respiratory: CTAB Ext: warm, distal sensation intact  Discharge Instructions   Discharge Instructions     Diet - low sodium heart healthy   Complete by: As directed    Increase activity slowly   Complete by: As directed       Allergies as of 03/07/2024       Reactions   Streptomycin Hives   Digoxin Other (See Comments)   Other reaction(s): Unknown Note: pt had lethargy, felt bad Note: pt had lethargy, felt bad Other reaction(s): Unknown Note: pt had lethargy, felt bad Other reaction(s): Unknown Note: pt had lethargy, felt bad Note: pt had lethargy, felt bad   Trospium Nausea And Vomiting   Citric Acid Other (See Comments)   Iodinated Contrast Media Hives        Medication List     STOP taking these medications    cetirizine 5 MG tablet Commonly known as: ZYRTEC   cholecalciferol  1000 units tablet Commonly known as: VITAMIN D    liver oil-zinc  oxide 40 % ointment Commonly known as: DESITIN   Systane 0.4-0.3 % Soln Generic drug: Polyethyl Glycol-Propyl Glycol       TAKE these medications    Alpha-Lipoic Acid 600 MG Tabs Take 1 capsule by mouth daily.   benzonatate  100 MG capsule Commonly known as: Tessalon  Perles Take 1 capsule (100 mg total) by mouth 3 (three) times daily  as needed for cough.   Calcium  Carbonate-Vitamin D  600-200 MG-UNIT Tabs Take 1 tablet by mouth 2 (two) times daily.   carbidopa -levodopa  50-200 MG tablet Commonly known as: SINEMET  CR Take 1 tablet by mouth 3 (three) times daily.   furosemide  20 MG tablet Commonly known as: LASIX  Take 20 mg by mouth daily.   gabapentin  300 MG capsule Commonly known as: NEURONTIN  Take 1 capsule (300 mg total) by mouth 2 (two) times daily.   latanoprost  0.005 % ophthalmic  solution Commonly known as: XALATAN  Place 1 drop into both eyes every evening.   lisinopril  2.5 MG tablet Commonly known as: ZESTRIL  Take 1 tablet (2.5 mg total) by mouth daily.   ondansetron  4 MG disintegrating tablet Commonly known as: ZOFRAN -ODT Take 1 tablet (4 mg total) by mouth every 8 (eight) hours as needed for nausea or vomiting.   oxyCODONE  5 MG immediate release tablet Commonly known as: Oxy IR/ROXICODONE  Take 1 tablet (5 mg total) by mouth every 6 (six) hours as needed for severe pain or moderate pain.   pantoprazole  40 MG tablet Commonly known as: PROTONIX  Take 1 tablet (40 mg total) by mouth 2 (two) times daily before a meal. What changed: when to take this   sertraline  100 MG tablet Commonly known as: ZOLOFT  Take 100 mg by mouth daily.   trimethoprim  100 MG tablet Commonly known as: TRIMPEX  Take 1 tablet (100 mg total) by mouth daily.       Allergies  Allergen Reactions   Streptomycin Hives   Digoxin Other (See Comments)    Other reaction(s): Unknown Note: pt had lethargy, felt bad Note: pt had lethargy, felt bad  Other reaction(s): Unknown Note: pt had lethargy, felt bad  Other reaction(s): Unknown Note: pt had lethargy, felt bad Note: pt had lethargy, felt bad   Trospium Nausea And Vomiting   Citric Acid Other (See Comments)   Iodinated Contrast Media Hives    Follow-up Information     Hooten, Robbie Chiles, MD Follow up.   Specialty: Orthopedic Surgery Contact information: [address] [facility] [place] [address] [phone]                  The results of significant diagnostics from this hospitalization (including imaging, microbiology, ancillary and laboratory) are listed below for reference.    Significant Diagnostic Studies: DG HIP UNILAT WITH PELVIS 2-3 VIEWS LEFT Result Date: 03/07/2024 CLINICAL DATA:  Closed reduction of left hip. EXAM: DG HIP (WITH OR WITHOUT PELVIS) 2-3V LEFT COMPARISON:  Pelvis  radiograph yesterday FINDINGS: Two fluoroscopic spot views of the hip submitted from the operating room. The previous arthroplasty dislocation has been reduced. Fluoroscopy time 0.2 seconds. Dose 0.8023 mGy. IMPRESSION: Intraoperative fluoroscopy during left hip arthroplasty reduction. Electronically Signed   By: Chadwick Colonel M.D.   On: 03/07/2024 09:22   DG C-Arm 1-60 Min-No Report Result Date: 03/07/2024 Fluoroscopy was utilized by the requesting physician.  No radiographic interpretation.   DG Pelvis 1-2 Views Result Date: 03/06/2024 CLINICAL DATA:  Left hip pain following bending over, initial encounter EXAM: PELVIS - 2 VIEW COMPARISON:  None Available. FINDINGS: There is dislocation of the femoral prosthesis from the acetabular component on the left. No acute fracture is noted. No soft tissue abnormality is seen. IMPRESSION: Superior dislocation of the femoral prosthesis. Electronically Signed   By: Violeta Grey M.D.   On: 03/06/2024 22:46    Microbiology: Recent Results (from the past 240 hours)  Surgical PCR screen  Status: Abnormal   Collection Time: 03/07/24  1:34 AM   Specimen: Nasal Mucosa; Nasal Swab  Result Value Ref Range Status   MRSA, PCR NEGATIVE NEGATIVE Final   Staphylococcus aureus POSITIVE (A) NEGATIVE Final    Comment: (NOTE) The Xpert SA Assay (FDA approved for NASAL specimens in patients 34 years of age and older), is one component of a comprehensive surveillance program. It is not intended to diagnose infection nor to guide or monitor treatment. Performed at [facility], [address] [place], [place] [address]      Labs: Basic Metabolic Panel: Recent Labs  Lab 03/06/24 2127 03/07/24 0359  NA 138 141  K 4.2 4.2  CL 100 109  CO2 26 25  GLUCOSE 92 89  BUN 45* 41*  CREATININE 1.18* 1.04*  CALCIUM  9.3 8.7*   Liver Function Tests: Recent Labs  Lab 03/06/24 2127  AST 17  ALT <5  ALKPHOS 71  BILITOT 0.6  PROT 7.3  ALBUMIN 4.2    No results for input(s): "LIPASE", "AMYLASE" in the last 168 hours. No results for input(s): "AMMONIA" in the last 168 hours. CBC: Recent Labs  Lab 03/06/24 2127 03/07/24 0359  WBC 6.3 7.2  NEUTROABS 3.7  --   HGB 12.9 11.3*  HCT 40.2 34.7*  MCV 97.6 97.7  PLT 225 192   Cardiac Enzymes: No results for input(s): "CKTOTAL", "CKMB", "CKMBINDEX", "TROPONINI" in the last 168 hours. BNP: BNP (last 3 results) No results for input(s): "BNP" in the last 8760 hours.  ProBNP (last 3 results) No results for input(s): "PROBNP" in the last 8760 hours.  CBG: No results for input(s): "GLUCAP" in the last 168 hours.     Signed:  Raymonde Calico MD.  [facility] 03/07/2024, 2:27 PM

## 2024-03-07 NOTE — Anesthesia Preprocedure Evaluation (Addendum)
 Anesthesia Evaluation  Patient identified by MRN, date of birth, ID band Patient awake    Reviewed: Allergy & Precautions, NPO status , Patient's Chart, lab work & pertinent test results  Airway Mallampati: III  TM Distance: >3 FB Neck ROM: full    Dental  (+) Dental Advidsory Given, Lower Dentures, Upper Dentures   Pulmonary neg pulmonary ROS   Pulmonary exam normal        Cardiovascular hypertension, (-) angina (-) Past MI and (-) CABG Normal cardiovascular exam     Neuro/Psych  PSYCHIATRIC DISORDERS  Depression     Parkinson's disease    GI/Hepatic Neg liver ROS, hiatal hernia,GERD  ,,  Endo/Other  negative endocrine ROS    Renal/GU Renal InsufficiencyRenal disease  negative genitourinary   Musculoskeletal   Abdominal   Peds  Hematology  (+) Blood dyscrasia, anemia   Anesthesia Other Findings Past Medical History: 05/12/2022: Acute metabolic encephalopathy No date: Back pain 2007: Breast cancer (HCC)     Comment:  right breast lumpectomy with rad tx 2007: Cancer (HCC)     Comment:  right breast No date: Collagen vascular disease (HCC) No date: Gout No date: Hard of hearing 05/16/2022: Headache No date: Hypertension No date: Parkinson's disease (HCC) 2007: Personal history of radiation therapy     Comment:  F/U right breast cancer 05/12/2022: UTI (urinary tract infection)  Past Surgical History: No date: ABDOMINAL HYSTERECTOMY 04/08/2022: ANTERIOR HIP REVISION; Right     Comment:  Procedure: Anterior hip revision cup and femoral head;                Surgeon: Molli Angelucci, MD;  Location: [facility];                Service: Orthopedics;  Laterality: Right; 2007: BREAST LUMPECTOMY; Right     Comment:  suspicious calcs, f/u with radiation No date: CATARACT EXTRACTION; Bilateral 05/25/2017: ESOPHAGOGASTRODUODENOSCOPY (EGD) WITH PROPOFOL ; N/A     Comment:  Procedure: ESOPHAGOGASTRODUODENOSCOPY (EGD) WITH                PROPOFOL ;  Surgeon: Deveron Fly, MD;  Location:               [facility];  Service: Endoscopy;  Laterality: N/A; 05/09/2018: ESOPHAGOGASTRODUODENOSCOPY (EGD) WITH PROPOFOL ; N/A     Comment:  Procedure: ESOPHAGOGASTRODUODENOSCOPY (EGD) WITH               PROPOFOL ;  Surgeon: Selena Daily, MD;  Location:               [facility];  Service: Gastroenterology;  Laterality:               N/A; 12/09/2021: ESOPHAGOGASTRODUODENOSCOPY (EGD) WITH PROPOFOL ; N/A     Comment:  Procedure: ESOPHAGOGASTRODUODENOSCOPY (EGD) WITH               PROPOFOL ;  Surgeon: Selena Daily, MD;  Location:               [facility];  Service: Gastroenterology;  Laterality:               N/A; No date: EYE SURGERY; Bilateral No date: HAND SURGERY 06/24/2015: HIP CLOSED REDUCTION; Left     Comment:  Procedure: CLOSED REDUCTION HIP;  Surgeon: Arlyne Lame, MD;  Location: [facility];  Service: Orthopedics;  Laterality: Left; 02/09/2016: HIP CLOSED REDUCTION; Left     Comment:  Procedure: CLOSED MANIPULATION HIP;  Surgeon: Marlynn Singer, MD;  Location: [facility];  Service: Orthopedics;                Laterality: Left; 10/31/2021: HIP CLOSED REDUCTION; Left     Comment:  Procedure: CLOSED REDUCTION HIP;  Surgeon: Elner Hahn, MD;  Location: [facility];  Service: Orthopedics;                Laterality: Left; No date: HIP SURGERY No date: JOINT REPLACEMENT     Comment:  bilateral hip No date: OOPHORECTOMY No date: TOTAL KNEE ARTHROPLASTY; Bilateral     Reproductive/Obstetrics negative OB ROS                              Anesthesia Physical Anesthesia Plan  ASA: 3  Anesthesia Plan: General   Post-op Pain Management: Minimal or no pain anticipated   Induction: Intravenous  PONV Risk Score and Plan: 3 and Propofol  infusion, TIVA and Ondansetron   Airway Management Planned: Nasal Cannula and Natural  Airway  Additional Equipment: None  Intra-op Plan:   Post-operative Plan:   Informed Consent: I have reviewed the patients History and Physical, chart, labs and discussed the procedure including the risks, benefits and alternatives for the proposed anesthesia with the patient or authorized representative who has indicated his/her understanding and acceptance.       Plan Discussed with: CRNA and Surgeon  Anesthesia Plan Comments: (Discussed risks of anesthesia with patient, including possibility of difficulty with spontaneous ventilation under anesthesia necessitating airway intervention, PONV, and rare risks such as cardiac or respiratory or neurological events, and allergic reactions. Discussed the role of CRNA in patient's perioperative care. Patient understands.  Son present)         Anesthesia Quick Evaluation

## 2024-03-08 ENCOUNTER — Encounter: Payer: Self-pay | Admitting: Orthopedic Surgery

## 2024-03-26 ENCOUNTER — Ambulatory Visit: Admitting: Internal Medicine

## 2024-03-29 ENCOUNTER — Ambulatory Visit: Admitting: Internal Medicine

## 2024-04-06 ENCOUNTER — Encounter: Payer: Self-pay | Admitting: Internal Medicine

## 2024-04-06 ENCOUNTER — Ambulatory Visit: Admitting: Internal Medicine

## 2024-04-06 VITALS — BP 120/64 | HR 58 | Ht 63.5 in

## 2024-04-06 DIAGNOSIS — I10 Essential (primary) hypertension: Secondary | ICD-10-CM | POA: Diagnosis not present

## 2024-04-06 DIAGNOSIS — G20A2 Parkinson's disease without dyskinesia, with fluctuations: Secondary | ICD-10-CM

## 2024-04-06 DIAGNOSIS — E782 Mixed hyperlipidemia: Secondary | ICD-10-CM | POA: Diagnosis not present

## 2024-04-06 DIAGNOSIS — K219 Gastro-esophageal reflux disease without esophagitis: Secondary | ICD-10-CM

## 2024-04-06 NOTE — Progress Notes (Signed)
 Established Patient Office Visit  Subjective:  Patient ID: Yvonne Singleton, female    DOB: [birth year]  Age: 89 y.o. MRN: [id number]  Chief Complaint  Patient presents with   Follow-up    3 month follow up    Patient comes in for follow-up today.  Mentions of mild cold-like symptoms with nasal and sinus congestion.  She does not have any fevers or chills, no bodyaches and no cough.  Did have some mild sore throat.  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  She is wondering if there might go for another hip replacement.  Otherwise feels stable.  Taking all her medications regularly.  No nausea or vomiting, no abdominal pain, no diarrhea or constipation.    No other concerns at this time.   Past Medical History:  Diagnosis Date   Acute metabolic encephalopathy 05/12/2022   Back pain    Breast cancer [facility]) 2007   right breast lumpectomy with rad tx   Cancer [facility]) 2007   right breast   Collagen vascular disease (HCC)    Gout    Hard of hearing    Headache 05/16/2022   Hypertension    Parkinson's disease (HCC)    Personal history of radiation therapy 2007   F/U right breast cancer   UTI (urinary tract infection) 05/12/2022    Past Surgical History:  Procedure Laterality Date   ABDOMINAL HYSTERECTOMY     ANTERIOR HIP REVISION Right 04/08/2022   Procedure: Anterior hip revision cup and femoral head;  Surgeon: Molli Angelucci, MD;  Location: [facility];  Service: Orthopedics;  Laterality: Right;   BREAST LUMPECTOMY Right 2007   suspicious calcs, f/u with radiation   CATARACT EXTRACTION Bilateral    ESOPHAGOGASTRODUODENOSCOPY (EGD) WITH PROPOFOL  N/A 05/25/2017   Procedure: ESOPHAGOGASTRODUODENOSCOPY (EGD) WITH PROPOFOL ;  Surgeon: Deveron Fly, MD;  Location: [facility] END[facility];  Service: Endoscopy;  Laterality: N/A;   ESOPHAGOGASTRODUODENOSCOPY (EGD) WITH PROPOFOL  N/A 05/09/2018   Procedure: ESOPHAGOGASTRODUODENOSCOPY (EGD) WITH  PROPOFOL ;  Surgeon: Selena Daily, MD;  Location: [facility] ENDOSCOPY;  Service: Gastroenterology;  Laterality: N/A;   ESOPHAGOGASTRODUODENOSCOPY (EGD) WITH PROPOFOL  N/A 12/09/2021   Procedure: ESOPHAGOGASTRODUODENOSCOPY (EGD) WITH PROPOFOL ;  Surgeon: Selena Daily, MD;  Location: [facility] ENDOSCOPY;  Service: Gastroenterology;  Laterality: N/A;   EYE SURGERY Bilateral    HAND SURGERY     HIP CLOSED REDUCTION Left 06/24/2015   Procedure: CLOSED REDUCTION HIP;  Surgeon: Arlyne Lame, MD;  Location: [facility];  Service: Orthopedics;  Laterality: Left;   HIP CLOSED REDUCTION Left 02/09/2016   Procedure: CLOSED MANIPULATION HIP;  Surgeon: Marlynn Singer, MD;  Location: [facility];  Service: Orthopedics;  Laterality: Left;   HIP CLOSED REDUCTION Left 10/31/2021   Procedure: CLOSED REDUCTION HIP;  Surgeon: Elner Hahn, MD;  Location: [facility];  Service: Orthopedics;  Laterality: Left;   HIP CLOSED REDUCTION Left 07/05/2022   Procedure: CLOSED REDUCTION HIP;  Surgeon: Arlyne Lame, MD;  Location: [facility];  Service: Orthopedics;  Laterality: Left;   HIP CLOSED REDUCTION Left 01/17/2023   Procedure: CLOSED REDUCTION HIP;  Surgeon: Venus Ginsberg, MD;  Location: [facility];  Service: Orthopedics;  Laterality: Left;   HIP CLOSED REDUCTION Left 05/17/2023   Procedure: CLOSED REDUCTION HIP;  Surgeon: Elner Hahn, MD;  Location: [facility];  Service: Orthopedics;  Laterality: Left;   HIP CLOSED REDUCTION Left 03/07/2024   Procedure: CLOSED REDUCTION, HIP;  Surgeon: Lorri Rota, MD;  Location: [facility];  Service: Orthopedics;  Laterality: Left;   HIP SURGERY     JOINT REPLACEMENT     bilateral hip   OOPHORECTOMY     TOTAL KNEE ARTHROPLASTY Bilateral     Social History   Socioeconomic History   Marital status: Widowed    Spouse name: Not on file   Number of children: Not on file   Years of education: Not on file   Highest education level: Not on file  Occupational History   Not on file  Tobacco Use    Smoking status: Never    Passive exposure: Never   Smokeless tobacco: Never  Substance and Sexual Activity   Alcohol use: No    Alcohol/week: 0.0 standard drinks of alcohol   Drug use: No   Sexual activity: Not Currently  Other Topics Concern   Not on file  Social History Narrative   Not on file   Social Drivers of Health   Financial Resource Strain: Not on file  Food Insecurity: No Food Insecurity (03/07/2024)   Hunger Vital Sign    Worried About Running Out of Food in the Last Year: Never true    Ran Out of Food in the Last Year: Never true  Transportation Needs: No Transportation Needs (03/07/2024)   PRAPARE - Administrator, Civil Service (Medical): No    Lack of Transportation (Non-Medical): No  Physical Activity: Not on file  Stress: Not on file  Social Connections: Unknown (03/07/2024)   Social Connection and Isolation Panel    Frequency of Communication with Friends and Family: More than three times a week    Frequency of Social Gatherings with Friends and Family: Once a week    Attends Religious Services: 1 to 4 times per year    Active Member of [employer] or Organizations: No    Attends Banker Meetings: Not on file    Marital Status: Not on file  Intimate Partner Violence: Not At Risk (03/07/2024)   Humiliation, Afraid, Rape, and Kick questionnaire    Fear of Current or Ex-Partner: No    Emotionally Abused: No    Physically Abused: No    Sexually Abused: No    Family History  Problem Relation Age of Onset   Heart disease Mother    Arthritis Mother    Heart disease Father    Ulcers Father    Breast cancer Maternal Aunt     Allergies  Allergen Reactions   Streptomycin Hives   Digoxin Other (See Comments)    Other reaction(s): Unknown Note: pt had lethargy, felt bad Note: pt had lethargy, felt bad  Other reaction(s): Unknown Note: pt had lethargy, felt bad  Other reaction(s): Unknown Note: pt had lethargy, felt bad Note: pt had  lethargy, felt bad   Trospium Nausea And Vomiting   Citric Acid Other (See Comments)   Iodinated Contrast Media Hives    Outpatient Medications Prior to Visit  Medication Sig   Alpha-Lipoic Acid 600 MG TABS Take 1 capsule by mouth daily.   benzonatate  (TESSALON  PERLES) 100 MG capsule Take 1 capsule (100 mg total) by mouth 3 (three) times daily as needed for cough.   Calcium  Carbonate-Vitamin D  600-200 MG-UNIT TABS Take 1 tablet by mouth 2 (two) times daily.   carbidopa -levodopa  (SINEMET  CR) 50-200 MG per tablet Take 1 tablet by mouth 3 (three) times daily.   furosemide  (LASIX ) 20 MG tablet Take 20 mg by mouth daily.   gabapentin  (NEURONTIN ) 300  MG capsule Take 1 capsule (300 mg total) by mouth 2 (two) times daily.   latanoprost  (XALATAN ) 0.005 % ophthalmic solution Place 1 drop into both eyes every evening.   lisinopril  (ZESTRIL ) 2.5 MG tablet Take 1 tablet (2.5 mg total) by mouth daily.   ondansetron  (ZOFRAN -ODT) 4 MG disintegrating tablet Take 1 tablet (4 mg total) by mouth every 8 (eight) hours as needed for nausea or vomiting.   oxyCODONE  (OXY IR/ROXICODONE ) 5 MG immediate release tablet Take 1 tablet (5 mg total) by mouth every 6 (six) hours as needed for severe pain or moderate pain.   pantoprazole  (PROTONIX ) 40 MG tablet Take 1 tablet (40 mg total) by mouth 2 (two) times daily before a meal.   sertraline  (ZOLOFT ) 100 MG tablet Take 100 mg by mouth daily.   trimethoprim  (TRIMPEX ) 100 MG tablet Take 1 tablet (100 mg total) by mouth daily.   acetaminophen  (TYLENOL ) 500 MG tablet Take 500 mg by mouth every 6 (six) hours as needed.   No facility-administered medications prior to visit.    Review of Systems  Constitutional: Negative.  Negative for chills, diaphoresis, fever, malaise/fatigue and weight loss.  HENT:  Positive for congestion and hearing loss. Negative for ear discharge and sinus pain.   Eyes: Negative.   Respiratory: Negative.  Negative for cough, shortness of breath and  stridor.   Cardiovascular: Negative.  Negative for chest pain, palpitations and leg swelling.  Gastrointestinal: Negative.  Negative for abdominal pain, constipation, diarrhea, heartburn, nausea and vomiting.  Genitourinary: Negative.  Negative for dysuria and flank pain.  Musculoskeletal:  Positive for joint pain. Negative for myalgias.  Skin: Negative.   Neurological: Negative.  Negative for dizziness, tingling, tremors and headaches.  Endo/Heme/Allergies: Negative.   Psychiatric/Behavioral: Negative.  Negative for depression and suicidal ideas. The patient is not nervous/anxious.        Objective:   BP 120/64   Pulse (!) 58   Ht 5' 3.5 (1.613 m)   SpO2 95%   BMI 30.88 kg/m   Vitals:   04/06/24 1141  BP: 120/64  Pulse: (!) 58  Height: 5' 3.5 (1.613 m)  Weight: Comment: unable to weigh  SpO2: 95%    Physical Exam Vitals and nursing note reviewed.  Constitutional:      Appearance: Normal appearance.  HENT:     Head: Normocephalic and atraumatic.     Nose: Nose normal.     Mouth/Throat:     Mouth: Mucous membranes are moist.     Pharynx: Oropharynx is clear.   Eyes:     Conjunctiva/sclera: Conjunctivae normal.     Pupils: Pupils are equal, round, and reactive to light.    Cardiovascular:     Rate and Rhythm: Normal rate and regular rhythm.     Pulses: Normal pulses.     Heart sounds: Normal heart sounds. No murmur heard. Pulmonary:     Effort: Pulmonary effort is normal.     Breath sounds: Normal breath sounds. No wheezing.  Abdominal:     General: Bowel sounds are normal.     Palpations: Abdomen is soft.     Tenderness: There is no abdominal tenderness. There is no right CVA tenderness or left CVA tenderness.   Musculoskeletal:        General: Normal range of motion.     Cervical back: Normal range of motion.     Right lower leg: No edema.     Left lower leg: No edema.   Skin:  General: Skin is warm and dry.   Neurological:     General: No  focal deficit present.     Mental Status: She is alert and oriented to person, place, and time.   Psychiatric:        Mood and Affect: Mood normal.        Behavior: Behavior normal.      No results found for any visits on 04/06/24.  Recent Results (from the past 2160 hours)  CBC with Differential     Status: None   Collection Time: 03/06/24  9:27 PM  Result Value Ref Range   WBC 6.3 4.0 - 10.5 K/uL   RBC 4.12 3.87 - 5.11 MIL/uL   Hemoglobin 12.9 12.0 - 15.0 g/dL   HCT 16.1 09.6 - 04.5 %   MCV 97.6 80.0 - 100.0 fL   MCH 31.3 26.0 - 34.0 pg   MCHC 32.1 30.0 - 36.0 g/dL   RDW 40.9 81.1 - 91.4 %   Platelets 225 150 - 400 K/uL   nRBC 0.0 0.0 - 0.2 %   Neutrophils Relative % 57 %   Neutro Abs 3.7 1.7 - 7.7 K/uL   Lymphocytes Relative 28 %   Lymphs Abs 1.7 0.7 - 4.0 K/uL   Monocytes Relative 9 %   Monocytes Absolute 0.6 0.1 - 1.0 K/uL   Eosinophils Relative 4 %   Eosinophils Absolute 0.3 0.0 - 0.5 K/uL   Basophils Relative 1 %   Basophils Absolute 0.0 0.0 - 0.1 K/uL   Immature Granulocytes 1 %   Abs Immature Granulocytes 0.03 0.00 - 0.07 K/uL    Comment: Performed at [facility], [address] [place], [place] [address]  Comprehensive metabolic panel     Status: Abnormal   Collection Time: 03/06/24  9:27 PM  Result Value Ref Range   Sodium 138 135 - 145 mmol/L   Potassium 4.2 3.5 - 5.1 mmol/L   Chloride 100 98 - 111 mmol/L   CO2 26 22 - 32 mmol/L   Glucose, Bld 92 70 - 99 mg/dL    Comment: Glucose reference range applies only to samples taken after fasting for at least 8 hours.   BUN 45 (H) 8 - 23 mg/dL   Creatinine, Ser 6.21 (H) 0.44 - 1.00 mg/dL   Calcium  9.3 8.9 - 10.3 mg/dL   Total Protein 7.3 6.5 - 8.1 g/dL   Albumin 4.2 3.5 - 5.0 g/dL   AST 17 15 - 41 U/L   ALT <5 0 - 44 U/L   Alkaline Phosphatase 71 38 - 126 U/L   Total Bilirubin 0.6 0.0 - 1.2 mg/dL   GFR, Estimated 45 (L) >60 mL/min    Comment: (NOTE) Calculated using the CKD-EPI Creatinine  Equation (2021)    Anion gap 12 5 - 15    Comment: Performed at [facility], [address] [place], [place] [address]  Surgical PCR screen     Status: Abnormal   Collection Time: 03/07/24  1:34 AM   Specimen: Nasal Mucosa; Nasal Swab  Result Value Ref Range   MRSA, PCR NEGATIVE NEGATIVE   Staphylococcus aureus POSITIVE (A) NEGATIVE    Comment: (NOTE) The Xpert SA Assay (FDA approved for NASAL specimens in patients 46 years of age and older), is one component of a comprehensive surveillance program. It is not intended to diagnose infection nor to guide or monitor treatment. Performed at [facility], [address] [place], [place] [address]   Basic  metabolic panel     Status: Abnormal   Collection Time: 03/07/24  3:59 AM  Result Value Ref Range   Sodium 141 135 - 145 mmol/L   Potassium 4.2 3.5 - 5.1 mmol/L   Chloride 109 98 - 111 mmol/L   CO2 25 22 - 32 mmol/L   Glucose, Bld 89 70 - 99 mg/dL    Comment: Glucose reference range applies only to samples taken after fasting for at least 8 hours.   BUN 41 (H) 8 - 23 mg/dL   Creatinine, Ser 0.45 (H) 0.44 - 1.00 mg/dL   Calcium  8.7 (L) 8.9 - 10.3 mg/dL   GFR, Estimated 52 (L) >60 mL/min    Comment: (NOTE) Calculated using the CKD-EPI Creatinine Equation (2021)    Anion gap 7 5 - 15    Comment: Performed at [facility], [address] [place], [place] [address]  CBC     Status: Abnormal   Collection Time: 03/07/24  3:59 AM  Result Value Ref Range   WBC 7.2 4.0 - 10.5 K/uL   RBC 3.55 (L) 3.87 - 5.11 MIL/uL   Hemoglobin 11.3 (L) 12.0 - 15.0 g/dL   HCT 19.1 (L) 47.8 - 29.5 %   MCV 97.7 80.0 - 100.0 fL   MCH 31.8 26.0 - 34.0 pg   MCHC 32.6 30.0 - 36.0 g/dL   RDW 62.1 30.8 - 65.7 %   Platelets 192 150 - 400 K/uL   nRBC 0.0 0.0 - 0.2 %    Comment: Performed at [facility], [address], [place], [place] [address]      Assessment & Plan:  Continue current medications.  Will check  labs at next visit. Problem List Items Addressed This Visit     Hyperlipidemia   Parkinson disease (HCC)   GERD (gastroesophageal reflux disease)   Essential hypertension, benign - Primary    Return in about 3 months (around 07/07/2024).   Total time spent: 30 minutes  Aisha Hove, MD  04/06/2024   This document may have been prepared by [facility] Voice Recognition software and as such may include unintentional dictation errors.

## 2024-05-20 NOTE — Discharge Instructions (Signed)
 Instructions after Total Hip Replacement   Yvonne Singleton P. Angie Fava., M.D.    Dept. of Orthopaedics & Sports Medicine [facility] [address] [place], [place]  [address]  Phone: [phone]   Fax: [phone]        [email or web address]        DIET: Drink plenty of non-alcoholic fluids. Resume your normal diet. Include foods high in fiber.  ACTIVITY:  You may use crutches or a walker with weight-bearing as tolerated, unless instructed otherwise. You may be weaned off of the walker or crutches by your Physical Therapist.  Do NOT reach below the level of your knees or cross your legs until allowed.    Continue doing gentle exercises. Exercising will reduce the pain and swelling, increase motion, and prevent muscle weakness.   Please continue to use the TED compression stockings for 6 weeks. You may remove the stockings at night, but should reapply them in the morning. Do not drive or operate any equipment until instructed.  WOUND CARE:  Continue to use ice packs periodically to reduce pain and swelling. The initial dressing (Aquacel) can remain in place for 7 days (see separate instructions). Keep the incision clean and dry. You may bathe or shower after the staples are removed at the first office visit following surgery.  MEDICATIONS: You may resume your regular medications. Please take the pain medication as prescribed on the medication. Do not take pain medication on an empty stomach. Unless instructed otherwise, you should take an enteric-coated aspirin 81 mg. TWICE a day. (This along with elevation will help reduce the possibility of blood clots/phlebitis in your operated leg.) Use a stool softener (such as Senokot-S or Colace) daily and a laxative (such as Miralax or Dulcolax) as needed to prevent constipation.  Do not drive or drink alcoholic beverages when taking pain medications.  CALL THE OFFICE FOR: Temperature above 101 degrees Excessive bleeding or drainage  on the dressing. Excessive swelling, coldness, or paleness of the toes. Persistent nausea and vomiting.  FOLLOW-UP:  You should have an appointment to return to the office in 6 weeks after surgery. Arrangements have been made for continuation of Physical Therapy (either home therapy or outpatient therapy).     [facility] Department Directory         [email or web address]       [email or web address]          Cardiology  Appointments: [place] - [phone]  Endocrinology  Appointments: [place] [phone] [place] - [phone]  Gastroenterology  Appointments: [place] [phone] [place] - [phone]        General Surgery   Appointments: [facility]  Internal Medicine/Family Medicine  Appointments: [facility] [place] - [phone] [place] - [phone]  Metabolic and Weigh Loss Surgery  Appointments: [facility]        Neurology  Appointments: [place] [phone] [place] - [phone]  Neurosurgery  Appointments: [place]  Obstetrics & Gynecology  Appointments: [place] [phone] [place] - [phone]        Pediatrics  Appointments: [facility] [phone] [place] - [phone]  Physiatry  Appointments: [place] [phone]  Physical Therapy  Appointments: [place] - [phone]        Podiatry  Appointments: [place] [phone] [place] - [phone]  Pulmonology  Appointments: [place]  Rheumatology  Appointments: [place] [phone]        [place] Location: [facility]  [address] [place], [place]  [address]  [place]  Location: [facility]. [address] [place], [place]  [address]  [place] Location: [facility] [address] [place], [place]  [address]

## 2024-05-21 ENCOUNTER — Encounter
Admission: RE | Admit: 2024-05-21 | Discharge: 2024-05-21 | Disposition: A | Discharge status: Home / Self Care | Source: Ambulatory Visit | Attending: Orthopedic Surgery | Admitting: Orthopedic Surgery

## 2024-05-21 ENCOUNTER — Other Ambulatory Visit: Payer: Self-pay

## 2024-05-21 VITALS — BP 134/73 | HR 49 | Resp 14 | Wt 182.0 lb

## 2024-05-21 DIAGNOSIS — E875 Hyperkalemia: Secondary | ICD-10-CM | POA: Diagnosis not present

## 2024-05-21 DIAGNOSIS — Z01818 Encounter for other preprocedural examination: Secondary | ICD-10-CM

## 2024-05-21 DIAGNOSIS — Y812 Prosthetic and other implants, materials and accessory general- and plastic-surgery devices associated with adverse incidents: Secondary | ICD-10-CM | POA: Diagnosis not present

## 2024-05-21 DIAGNOSIS — Z01812 Encounter for preprocedural laboratory examination: Secondary | ICD-10-CM | POA: Diagnosis present

## 2024-05-21 DIAGNOSIS — R8290 Unspecified abnormal findings in urine: Secondary | ICD-10-CM | POA: Diagnosis not present

## 2024-05-21 DIAGNOSIS — N1831 Chronic kidney disease, stage 3a: Secondary | ICD-10-CM | POA: Diagnosis not present

## 2024-05-21 DIAGNOSIS — R8281 Pyuria: Secondary | ICD-10-CM | POA: Diagnosis not present

## 2024-05-21 DIAGNOSIS — T84021A Dislocation of internal left hip prosthesis, initial encounter: Secondary | ICD-10-CM | POA: Insufficient documentation

## 2024-05-21 DIAGNOSIS — G9341 Metabolic encephalopathy: Secondary | ICD-10-CM | POA: Insufficient documentation

## 2024-05-21 DIAGNOSIS — M24459 Recurrent dislocation, unspecified hip: Secondary | ICD-10-CM

## 2024-05-21 HISTORY — DX: Other cerebrovascular disease: I67.89

## 2024-05-21 HISTORY — DX: Other specified health status: Z78.9

## 2024-05-21 HISTORY — DX: Personal history of other diseases of the digestive system: Z87.19

## 2024-05-21 HISTORY — DX: Presence of artificial hip joint, bilateral: Z96.643

## 2024-05-21 HISTORY — DX: Deficiency of other specified B group vitamins: E53.8

## 2024-05-21 HISTORY — DX: Gastro-esophageal reflux disease without esophagitis: K21.9

## 2024-05-21 HISTORY — DX: Ventral hernia without obstruction or gangrene: K43.9

## 2024-05-21 HISTORY — DX: Atherosclerosis of aorta: I70.0

## 2024-05-21 HISTORY — DX: Recurrent dislocation, unspecified hip: M24.459

## 2024-05-21 HISTORY — DX: Unspecified glaucoma: H40.9

## 2024-05-21 HISTORY — DX: Unspecified macular degeneration: H35.30

## 2024-05-21 HISTORY — DX: Hyperlipidemia, unspecified: E78.5

## 2024-05-21 HISTORY — DX: Polyneuropathy, unspecified: G62.9

## 2024-05-21 HISTORY — DX: Fatty (change of) liver, not elsewhere classified: K76.0

## 2024-05-21 HISTORY — DX: Depression, unspecified: F32.A

## 2024-05-21 HISTORY — DX: Rheumatoid arthritis, unspecified: M06.9

## 2024-05-21 HISTORY — DX: Other ill-defined heart diseases: I51.89

## 2024-05-21 HISTORY — DX: Spinal stenosis, lumbar region without neurogenic claudication: M48.061

## 2024-05-21 HISTORY — DX: Chronic kidney disease, stage 3 unspecified: N18.30

## 2024-05-21 LAB — URINALYSIS, ROUTINE W REFLEX MICROSCOPIC
Bilirubin Urine: NEGATIVE
Glucose, UA: NEGATIVE mg/dL
Ketones, ur: NEGATIVE mg/dL
Nitrite: NEGATIVE
Protein, ur: NEGATIVE mg/dL
Specific Gravity, Urine: 1.009 (ref 1.005–1.030)
pH: 5 (ref 5.0–8.0)

## 2024-05-21 LAB — CBC WITH DIFFERENTIAL/PLATELET
Abs Immature Granulocytes: 0.04 K/uL (ref 0.00–0.07)
Basophils Absolute: 0 K/uL (ref 0.0–0.1)
Basophils Relative: 1 %
Eosinophils Absolute: 0.2 K/uL (ref 0.0–0.5)
Eosinophils Relative: 4 %
HCT: 38.2 % (ref 36.0–46.0)
Hemoglobin: 12.2 g/dL (ref 12.0–15.0)
Immature Granulocytes: 1 %
Lymphocytes Relative: 19 %
Lymphs Abs: 1.1 K/uL (ref 0.7–4.0)
MCH: 31 pg (ref 26.0–34.0)
MCHC: 31.9 g/dL (ref 30.0–36.0)
MCV: 97.2 fL (ref 80.0–100.0)
Monocytes Absolute: 0.4 K/uL (ref 0.1–1.0)
Monocytes Relative: 7 %
Neutro Abs: 3.9 K/uL (ref 1.7–7.7)
Neutrophils Relative %: 68 %
Platelets: 231 K/uL (ref 150–400)
RBC: 3.93 MIL/uL (ref 3.87–5.11)
RDW: 13.1 % (ref 11.5–15.5)
WBC: 5.7 K/uL (ref 4.0–10.5)
nRBC: 0 % (ref 0.0–0.2)

## 2024-05-21 LAB — COMPREHENSIVE METABOLIC PANEL WITH GFR
ALT: <5 U/L (ref 0–44)
AST: 16 U/L (ref 15–41)
Albumin: 3.6 g/dL (ref 3.5–5.0)
Alkaline Phosphatase: 78 U/L (ref 38–126)
Anion gap: 11 (ref 5–15)
BUN: 54 mg/dL — ABNORMAL HIGH (ref 8–23)
CO2: 27 mmol/L (ref 22–32)
Calcium: 9.3 mg/dL (ref 8.9–10.3)
Chloride: 105 mmol/L (ref 98–111)
Creatinine, Ser: 1.3 mg/dL — ABNORMAL HIGH (ref 0.44–1.00)
GFR, Estimated: 40 mL/min — ABNORMAL LOW (ref >60)
Glucose, Bld: 94 mg/dL (ref 70–99)
Potassium: 4.5 mmol/L (ref 3.5–5.1)
Sodium: 143 mmol/L (ref 135–145)
Total Bilirubin: 0.8 mg/dL (ref 0.0–1.2)
Total Protein: 6.8 g/dL (ref 6.5–8.1)

## 2024-05-21 LAB — TYPE AND SCREEN
ABO/RH(D): O POS
Antibody Screen: NEGATIVE

## 2024-05-21 LAB — SURGICAL PCR SCREEN
MRSA, PCR: NEGATIVE
Staphylococcus aureus: POSITIVE — AB

## 2024-05-21 LAB — C-REACTIVE PROTEIN: CRP: <0.5 mg/dL (ref <1.0)

## 2024-05-21 LAB — SEDIMENTATION RATE: Sed Rate: 25 mm/h (ref 0–30)

## 2024-05-21 NOTE — Patient Instructions (Signed)
 Your procedure is scheduled on:05-30-24 Wednesday Report to the Registration Desk on the 1st floor of the [facility].Then proceed to the 2nd floor Surgery Desk To find out your arrival time, please call [phone] between 1PM - 3PM on:05-29-24 Tuesday If your arrival time is 6:00 am, do not arrive before that time as the [facility] entrance doors do not open until 6:00 am.  REMEMBER: Instructions that are not followed completely may result in serious medical risk, up to and including death; or upon the discretion of your surgeon and anesthesiologist your surgery may need to be rescheduled.  Do not eat food after midnight the night before surgery.  No gum chewing or hard candies.  You may however, drink CLEAR liquids up to 2 hours before you are scheduled to arrive for your surgery. Do not drink anything within 2 hours of your scheduled arrival time.  Clear liquids include: - water  - apple juice without pulp - gatorade (not RED colors) - black coffee or tea (Do NOT add milk or creamers to the coffee or tea) Do NOT drink anything that is not on this list.  In addition, your doctor has ordered for you to drink the provided:  Ensure Pre-Surgery Clear Carbohydrate Drink  Drinking this carbohydrate drink up to two hours before surgery helps to reduce insulin resistance and improve patient outcomes. Please complete drinking 2 hours before scheduled arrival time.  One week prior to surgery:Stop NOW (05-21-24) Stop Anti-inflammatories (NSAIDS) such as Advil, Aleve, Ibuprofen, Motrin, Naproxen, Naprosyn and Aspirin  based products such as Excedrin, Goody's Powder, BC Powder. Stop ANY OVER THE COUNTER supplements until after surgery (Alpha-Lipoic Acid, Calcium  + D))  You may however, continue to take Tylenol  if needed for pain up until the day of surgery.  Continue taking all of your other prescription medications up until the day of surgery.  ON THE DAY OF SURGERY ONLY TAKE THESE  MEDICATIONS WITH SIPS OF WATER: -carbidopa -levodopa  (SINEMET  CR)  -pantoprazole  (PROTONIX )  -sertraline  (ZOLOFT )   No Alcohol for 24 hours before or after surgery.  No Smoking including e-cigarettes for 24 hours before surgery.  No chewable tobacco products for at least 6 hours before surgery.  No nicotine patches on the day of surgery.  Do not use any recreational drugs for at least a week (preferably 2 weeks) before your surgery.  Please be advised that the combination of cocaine and anesthesia may have negative outcomes, up to and including death. If you test positive for cocaine, your surgery will be cancelled.  On the morning of surgery brush your teeth with toothpaste and water, you may rinse your mouth with mouthwash if you wish. Do not swallow any toothpaste or mouthwash.  Use CHG Soap as directed on instruction sheet.  Do not wear jewelry, make-up, hairpins, clips or nail polish.  For welded (permanent) jewelry: bracelets, anklets, waist bands, etc.  Please have this removed prior to surgery.  If it is not removed, there is a chance that hospital personnel will need to cut it off on the day of surgery.  Do not wear lotions, powders, or perfumes.   Do not shave body hair from the neck down 48 hours before surgery.  Contact lenses, hearing aids and dentures may not be worn into surgery.  Do not bring valuables to the hospital. [facility] is not responsible for any missing/lost belongings or valuables.   Notify your doctor if there is any change in your medical condition (cold, fever, infection).  Wear comfortable clothing (specific to your surgery type) to the hospital.  After surgery, you can help prevent lung complications by doing breathing exercises.  Take deep breaths and cough every 1-2 hours. Your doctor may order a device called an Incentive Spirometer to help you take deep breaths. When coughing or sneezing, hold a pillow firmly against your incision with both  hands. This is called "splinting." Doing this helps protect your incision. It also decreases belly discomfort.  If you are being admitted to the hospital overnight, leave your suitcase in the car. After surgery it may be brought to your room.  In case of increased patient census, it may be necessary for you, the patient, to continue your postoperative care in the Same Day Surgery department.  If you are being discharged the day of surgery, you will not be allowed to drive home. You will need a responsible individual to drive you home and stay with you for 24 hours after surgery.   If you are taking public transportation, you will need to have a responsible individual with you.  Please call the Pre-admissions Testing Dept. at [phone] if you have any questions about these instructions.  Surgery Visitation Policy:  Patients having surgery or a procedure may have two visitors.  Children under the age of 65 must have an adult with them who is not the patient.  Inpatient Visitation:    Visiting hours are 7 a.m. to 8 p.m. Up to four visitors are allowed at one time in a patient room. The visitors may rotate out with other people during the day.  One visitor age 59 or older may stay with the patient overnight and must be in the room by 8 p.m.    Pre-operative 5 CHG Bath Instructions   You can play a key role in reducing the risk of infection after surgery. Your skin needs to be as free of germs as possible. You can reduce the number of germs on your skin by washing with CHG (chlorhexidine  gluconate) soap before surgery. CHG is an antiseptic soap that kills germs and continues to kill germs even after washing.   DO NOT use if you have an allergy to chlorhexidine /CHG or antibacterial soaps. If your skin becomes reddened or irritated, stop using the CHG and notify one of our RNs at [phone].   Please shower with the CHG soap starting 4 days before surgery using the following schedule:      Please keep in mind the following:  DO NOT shave, including legs and underarms, starting the day of your first shower.   You may shave your face at any point before/day of surgery.  Place clean sheets on your bed the day you start using CHG soap. Use a clean washcloth (not used since being washed) for each shower. DO NOT sleep with pets once you start using the CHG.   CHG Shower Instructions:  If you choose to wash your hair and private area, wash first with your normal shampoo/soap.  After you use shampoo/soap, rinse your hair and body thoroughly to remove shampoo/soap residue.  Turn the water OFF and apply about 3 tablespoons (45 ml) of CHG soap to a CLEAN washcloth.  Apply CHG soap ONLY FROM YOUR NECK DOWN TO YOUR TOES (washing for 3-5 minutes)  DO NOT use CHG soap on face, private areas, open wounds, or sores.  Pay special attention to the area where your surgery is being performed.  If you are having back surgery, having  someone wash your back for you may be helpful. Wait 2 minutes after CHG soap is applied, then you may rinse off the CHG soap.  Pat dry with a clean towel  Put on clean clothes/pajamas   If you choose to wear lotion, please use ONLY the CHG-compatible lotions on the back of this paper.     Additional instructions for the day of surgery: DO NOT APPLY any lotions, deodorants, cologne, or perfumes.   Put on clean/comfortable clothes.  Brush your teeth.  Ask your nurse before applying any prescription medications to the skin.      CHG Compatible Lotions   Aveeno Moisturizing lotion  Cetaphil Moisturizing Cream  Cetaphil Moisturizing Lotion  Clairol Herbal Essence Moisturizing Lotion, Dry Skin  Clairol Herbal Essence Moisturizing Lotion, Extra Dry Skin  Clairol Herbal Essence Moisturizing Lotion, Normal Skin  Curel Age Defying Therapeutic Moisturizing Lotion with Alpha Hydroxy  Curel Extreme Care Body Lotion  Curel Soothing Hands Moisturizing Hand  Lotion  Curel Therapeutic Moisturizing Cream, Fragrance-Free  Curel Therapeutic Moisturizing Lotion, Fragrance-Free  Curel Therapeutic Moisturizing Lotion, Original Formula  Eucerin Daily Replenishing Lotion  Eucerin Dry Skin Therapy Plus Alpha Hydroxy Crme  Eucerin Dry Skin Therapy Plus Alpha Hydroxy Lotion  Eucerin Original Crme  Eucerin Original Lotion  Eucerin Plus Crme Eucerin Plus Lotion  Eucerin TriLipid Replenishing Lotion  Keri Anti-Bacterial Hand Lotion  Keri Deep Conditioning Original Lotion Dry Skin Formula Softly Scented  Keri Deep Conditioning Original Lotion, Fragrance Free Sensitive Skin Formula  Keri Lotion Fast Absorbing Fragrance Free Sensitive Skin Formula  Keri Lotion Fast Absorbing Softly Scented Dry Skin Formula  Keri Original Lotion  Keri Skin Renewal Lotion Keri Silky Smooth Lotion  Keri Silky Smooth Sensitive Skin Lotion  Nivea Body Creamy Conditioning Oil  Nivea Body Extra Enriched Lotion  Nivea Body Original Lotion  Nivea Body Sheer Moisturizing Lotion Nivea Crme  Nivea Skin Firming Lotion  NutraDerm 30 Skin Lotion  NutraDerm Skin Lotion  NutraDerm Therapeutic Skin Cream  NutraDerm Therapeutic Skin Lotion  ProShield Protective Hand Cream  Provon moisturizing lotion  How to Use an Incentive Spirometer An incentive spirometer is a tool that measures how well you are filling your lungs with each breath. Learning to take long, deep breaths using this tool can help you keep your lungs clear and active. This may help to reverse or lessen your chance of developing breathing (pulmonary) problems, especially infection. You may be asked to use a spirometer: After a surgery. If you have a lung problem or a history of smoking. After a long period of time when you have been unable to move or be active. If the spirometer includes an indicator to show the highest number that you have reached, your health care provider or respiratory therapist will help you set a  goal. Keep a log of your progress as told by your health care provider. What are the risks? Breathing too quickly may cause dizziness or cause you to pass out. Take your time so you do not get dizzy or light-headed. If you are in pain, you may need to take pain medicine before doing incentive spirometry. It is harder to take a deep breath if you are having pain. How to use your incentive spirometer  Sit up on the edge of your bed or on a chair. Hold the incentive spirometer so that it is in an upright position. Before you use the spirometer, breathe out normally. Place the mouthpiece in your mouth. Make sure your lips  are closed tightly around it. Breathe in slowly and as deeply as you can through your mouth, causing the piston or the ball to rise toward the top of the chamber. Hold your breath for 3-5 seconds, or for as long as possible. If the spirometer includes a coach indicator, use this to guide you in breathing. Slow down your breathing if the indicator goes above the marked areas. Remove the mouthpiece from your mouth and breathe out normally. The piston or ball will return to the bottom of the chamber. Rest for a few seconds, then repeat the steps 10 or more times. Take your time and take a few normal breaths between deep breaths so that you do not get dizzy or light-headed. Do this every 1-2 hours when you are awake. If the spirometer includes a goal marker to show the highest number you have reached (best effort), use this as a goal to work toward during each repetition. After each set of 10 deep breaths, cough a few times. This will help to make sure that your lungs are clear. If you have an incision on your chest or abdomen from surgery, place a pillow or a rolled-up towel firmly against the incision when you cough. This can help to reduce pain while taking deep breaths and coughing. General tips When you are able to get out of bed: Walk around often. Continue to take deep breaths  and cough in order to clear your lungs. Keep using the incentive spirometer until your health care provider says it is okay to stop using it. If you have been in the hospital, you may be told to keep using the spirometer at home. Contact a health care provider if: You are having difficulty using the spirometer. You have trouble using the spirometer as often as instructed. Your pain medicine is not giving enough relief for you to use the spirometer as told. You have a fever. Get help right away if: You develop shortness of breath. You develop a cough with bloody mucus from the lungs. You have fluid or blood coming from an incision site after you cough. Summary An incentive spirometer is a tool that can help you learn to take long, deep breaths to keep your lungs clear and active. You may be asked to use a spirometer after a surgery, if you have a lung problem or a history of smoking, or if you have been inactive for a long period of time. Use your incentive spirometer as instructed every 1-2 hours while you are awake. If you have an incision on your chest or abdomen, place a pillow or a rolled-up towel firmly against your incision when you cough. This will help to reduce pain. Get help right away if you have shortness of breath, you cough up bloody mucus, or blood comes from your incision when you cough. This information is not intended to replace advice given to you by your health care provider. Make sure you discuss any questions you have with your health care provider. Document Revised: 08/12/2023 Document Reviewed: 08/12/2023 Elsevier Patient Education  2024 [employer]   Preoperative Educational Videos for Total Hip, Knee and Shoulder Replacements  To better prepare for surgery, please view our videos that explain the physical activity and discharge planning required to have the best surgical recovery at [facility]  [email or web address]  Questions? Call [phone] or email [email or web address]       Community Resource Directory to address health-related social needs:  https://.Proor.no

## 2024-05-22 ENCOUNTER — Ambulatory Visit: Payer: Self-pay | Admitting: Urgent Care

## 2024-05-22 DIAGNOSIS — R8271 Bacteriuria: Secondary | ICD-10-CM

## 2024-05-22 DIAGNOSIS — Z01812 Encounter for preprocedural laboratory examination: Secondary | ICD-10-CM

## 2024-05-22 NOTE — Progress Notes (Signed)
  [facility] Perioperative Services: Pre-Admission/Anesthesia Testing  Abnormal Lab Notification   Date: 05/22/24  Name: Yvonne Singleton MRN:   [id number]  Re: Abnormal labs noted during PAT appointment   Notified:  Provider Name Provider Role Notification Mode  Mardee Agent, MD Orthopedics (Surgeon) Routed and/or faxed via [facility]   Abnormal Lab Value(s):   Lab Results  Component Value Date   COLORURINE YELLOW (A) 05/21/2024   APPEARANCEUR CLEAR (A) 05/21/2024   LABSPEC 1.009 05/21/2024   PHURINE 5.0 05/21/2024   GLUCOSEU NEGATIVE 05/21/2024   HGBUR SMALL (A) 05/21/2024   BILIRUBINUR NEGATIVE 05/21/2024   KETONESUR NEGATIVE 05/21/2024   PROTEINUR NEGATIVE 05/21/2024   NITRITE NEGATIVE 05/21/2024   LEUKOCYTESUR SMALL (A) 05/21/2024   EPIU 0-5 05/21/2024   WBCU 11-20 05/21/2024   RBCU 0-5 05/21/2024   BACTERIA RARE (A) 05/21/2024   CULT (A) 05/21/2024    >=100,000 COLONIES/mL GRAM NEGATIVE RODS IDENTIFICATION AND SUSCEPTIBILITIES TO FOLLOW Performed at [facility], [address] [place], [place] [address]    Clinical Information and Notes:  Patient is scheduled for LEFT HIP ARTHROPLASTY REVISION on 05/30/2024.    UA performed in PAT consistent with/concerning for infection.  No leukocytosis noted on CBC; WBC 5.7 Renal function: Estimated Creatinine Clearance: 31.4 mL/min (A) (by C-G formula based on SCr of 1.3 mg/dL (H)). Urine C&S added to assess for pathogenically significant growth.  Impression and Plan:  Yvonne Singleton with a UA that was (+) for infection; reflex culture sent. Preliminary culture (+) for significant GNR colony count; final pathogen ID and susceptibilities pending. Will plan on forwarding final culture results to MD as they become available to me. Sending results for review and consideration of preoperative treatment as deemed appropriate by Dr. Hooten.   Encounter Diagnoses  Name Primary?    Pre-operative laboratory examination Yes   Bacteriuria with pyuria    Dorise Pereyra, MSN, APRN, FNP-C, CEN [facility]  Perioperative Services Nurse Practitioner Phone: [phone] Fax: [phone] 05/22/24 7:21 PM  NOTE: This note has been prepared using Dragon dictation software. Despite my best ability to proofread, there is always the potential that unintentional transcriptional errors may still occur from this process.

## 2024-05-23 LAB — URINE CULTURE: Culture: >=100000 — AB

## 2024-05-25 NOTE — Progress Notes (Addendum)
 Called [facility] to make sure they had received pt's surgery instructions. Wilkie states that they did receive instructions and have no questions regarding instructions. Also reviewed instructions with pt and her daughter and gave extra copy of instructions to daughter while they were here in PAT.  Pt verbalized understanding of instructions along with Wilkie

## 2024-05-29 MED ORDER — LACTATED RINGERS IV SOLN: INTRAVENOUS | Status: DC

## 2024-05-29 MED ORDER — DEXAMETHASONE SODIUM PHOSPHATE 10 MG/ML IJ SOLN
8.0000 mg | Freq: Once | INTRAMUSCULAR | Status: AC
  Administered 2024-05-30 (×2): 8 mg via INTRAVENOUS

## 2024-05-29 MED ORDER — CHLORHEXIDINE GLUCONATE 4 % EX SOLN: 60.0000 mL | Freq: Once | CUTANEOUS | Status: DC

## 2024-05-29 MED ORDER — TRANEXAMIC ACID-NACL 1000-0.7 MG/100ML-% IV SOLN
1000.0000 mg | INTRAVENOUS | Status: AC
  Administered 2024-05-30 (×2): 1000 mg via INTRAVENOUS

## 2024-05-29 MED ORDER — CHLORHEXIDINE GLUCONATE 0.12 % MT SOLN
15.0000 mL | Freq: Once | OROMUCOSAL | Status: AC
  Administered 2024-05-30 (×2): 15 mL via OROMUCOSAL

## 2024-05-29 MED ORDER — CELECOXIB 200 MG PO CAPS
400.0000 mg | ORAL_CAPSULE | Freq: Once | ORAL | Status: AC
  Administered 2024-05-30 (×2): 400 mg via ORAL

## 2024-05-29 MED ORDER — ORAL CARE MOUTH RINSE: 15.0000 mL | Freq: Once | OROMUCOSAL | Status: AC

## 2024-05-29 MED ORDER — GABAPENTIN 300 MG PO CAPS
300.0000 mg | ORAL_CAPSULE | Freq: Once | ORAL | Status: AC
  Administered 2024-05-30 (×2): 300 mg via ORAL

## 2024-05-29 MED ORDER — CEFAZOLIN SODIUM-DEXTROSE 2-4 GM/100ML-% IV SOLN
2.0000 g | INTRAVENOUS | Status: AC
  Administered 2024-05-30 (×2): 2 g via INTRAVENOUS

## 2024-05-30 ENCOUNTER — Inpatient Hospital Stay
Admission: RE | Admit: 2024-05-30 | Discharge: 2024-06-02 | DRG: 467 | Disposition: A | Discharge status: Skilled Nursing Facility | Attending: Orthopedic Surgery | Admitting: Orthopedic Surgery

## 2024-05-30 ENCOUNTER — Encounter: Payer: Self-pay | Admitting: Orthopedic Surgery

## 2024-05-30 ENCOUNTER — Inpatient Hospital Stay: Payer: Self-pay | Admitting: Urgent Care

## 2024-05-30 ENCOUNTER — Inpatient Hospital Stay

## 2024-05-30 ENCOUNTER — Other Ambulatory Visit: Payer: Self-pay

## 2024-05-30 ENCOUNTER — Encounter
Admission: RE | Disposition: A | Discharge status: Skilled Nursing Facility | Payer: Self-pay | Source: Home / Self Care | Attending: Orthopedic Surgery

## 2024-05-30 DIAGNOSIS — E785 Hyperlipidemia, unspecified: Secondary | ICD-10-CM | POA: Diagnosis present

## 2024-05-30 DIAGNOSIS — Z9049 Acquired absence of other specified parts of digestive tract: Secondary | ICD-10-CM | POA: Diagnosis not present

## 2024-05-30 DIAGNOSIS — M549 Dorsalgia, unspecified: Secondary | ICD-10-CM | POA: Diagnosis present

## 2024-05-30 DIAGNOSIS — N183 Chronic kidney disease, stage 3 unspecified: Secondary | ICD-10-CM | POA: Diagnosis present

## 2024-05-30 DIAGNOSIS — Z7982 Long term (current) use of aspirin: Secondary | ICD-10-CM | POA: Diagnosis not present

## 2024-05-30 DIAGNOSIS — Y793 Surgical instruments, materials and orthopedic devices (including sutures) associated with adverse incidents: Secondary | ICD-10-CM | POA: Diagnosis present

## 2024-05-30 DIAGNOSIS — K760 Fatty (change of) liver, not elsewhere classified: Secondary | ICD-10-CM | POA: Diagnosis present

## 2024-05-30 DIAGNOSIS — H409 Unspecified glaucoma: Secondary | ICD-10-CM | POA: Diagnosis present

## 2024-05-30 DIAGNOSIS — Z6832 Body mass index (BMI) 32.0-32.9, adult: Secondary | ICD-10-CM

## 2024-05-30 DIAGNOSIS — T84021A Dislocation of internal left hip prosthesis, initial encounter: Principal | ICD-10-CM | POA: Diagnosis present

## 2024-05-30 DIAGNOSIS — Z888 Allergy status to other drugs, medicaments and biological substances status: Secondary | ICD-10-CM

## 2024-05-30 DIAGNOSIS — Z96641 Presence of right artificial hip joint: Secondary | ICD-10-CM | POA: Diagnosis present

## 2024-05-30 DIAGNOSIS — Z96653 Presence of artificial knee joint, bilateral: Secondary | ICD-10-CM | POA: Diagnosis present

## 2024-05-30 DIAGNOSIS — I130 Hypertensive heart and chronic kidney disease with heart failure and stage 1 through stage 4 chronic kidney disease, or unspecified chronic kidney disease: Secondary | ICD-10-CM | POA: Diagnosis present

## 2024-05-30 DIAGNOSIS — E875 Hyperkalemia: Secondary | ICD-10-CM

## 2024-05-30 DIAGNOSIS — M069 Rheumatoid arthritis, unspecified: Secondary | ICD-10-CM | POA: Diagnosis present

## 2024-05-30 DIAGNOSIS — I700 Atherosclerosis of aorta: Secondary | ICD-10-CM | POA: Diagnosis present

## 2024-05-30 DIAGNOSIS — S73005D Unspecified dislocation of left hip, subsequent encounter: Secondary | ICD-10-CM | POA: Diagnosis present

## 2024-05-30 DIAGNOSIS — K219 Gastro-esophageal reflux disease without esophagitis: Secondary | ICD-10-CM | POA: Diagnosis present

## 2024-05-30 DIAGNOSIS — G629 Polyneuropathy, unspecified: Secondary | ICD-10-CM | POA: Diagnosis present

## 2024-05-30 DIAGNOSIS — H9190 Unspecified hearing loss, unspecified ear: Secondary | ICD-10-CM | POA: Diagnosis present

## 2024-05-30 DIAGNOSIS — Z96649 Presence of unspecified artificial hip joint: Secondary | ICD-10-CM

## 2024-05-30 DIAGNOSIS — E669 Obesity, unspecified: Secondary | ICD-10-CM | POA: Diagnosis present

## 2024-05-30 DIAGNOSIS — M810 Age-related osteoporosis without current pathological fracture: Secondary | ICD-10-CM | POA: Diagnosis present

## 2024-05-30 DIAGNOSIS — H3530 Unspecified macular degeneration: Secondary | ICD-10-CM | POA: Diagnosis present

## 2024-05-30 DIAGNOSIS — G20A1 Parkinson's disease without dyskinesia, without mention of fluctuations: Secondary | ICD-10-CM | POA: Diagnosis present

## 2024-05-30 DIAGNOSIS — Z881 Allergy status to other antibiotic agents status: Secondary | ICD-10-CM | POA: Diagnosis not present

## 2024-05-30 DIAGNOSIS — Z91041 Radiographic dye allergy status: Secondary | ICD-10-CM

## 2024-05-30 DIAGNOSIS — G8929 Other chronic pain: Secondary | ICD-10-CM | POA: Diagnosis present

## 2024-05-30 DIAGNOSIS — Z853 Personal history of malignant neoplasm of breast: Secondary | ICD-10-CM

## 2024-05-30 DIAGNOSIS — N1831 Chronic kidney disease, stage 3a: Secondary | ICD-10-CM

## 2024-05-30 DIAGNOSIS — G9341 Metabolic encephalopathy: Secondary | ICD-10-CM

## 2024-05-30 DIAGNOSIS — Z79899 Other long term (current) drug therapy: Secondary | ICD-10-CM

## 2024-05-30 HISTORY — PX: TOTAL HIP ARTHROPLASTY WITH HARDWARE REMOVAL: SHX6438

## 2024-05-30 SURGERY — REVISION, ARTHROPLASTY, HIP
Anesthesia: Spinal | Site: Hip | Laterality: Left

## 2024-05-30 MED ORDER — OXYCODONE HCL 5 MG PO TABS
5.0000 mg | ORAL_TABLET | ORAL | Status: DC | PRN
  Administered 2024-05-31 – 2024-06-01 (×2): 5 mg via ORAL
  Filled 2024-05-30 (×2): qty 1

## 2024-05-30 MED ORDER — CARBIDOPA-LEVODOPA ER 50-200 MG PO TBCR
1.0000 | EXTENDED_RELEASE_TABLET | Freq: Three times a day (TID) | ORAL | Status: DC
  Administered 2024-05-30 – 2024-06-02 (×9): 1 via ORAL
  Filled 2024-05-30 (×12): qty 1

## 2024-05-30 MED ORDER — PHENOL 1.4 % MT LIQD: 1.0000 | OROMUCOSAL | Status: DC | PRN

## 2024-05-30 MED ORDER — TRIMETHOPRIM 100 MG PO TABS
100.0000 mg | ORAL_TABLET | Freq: Every day | ORAL | Status: DC
  Administered 2024-05-31 – 2024-06-02 (×3): 100 mg via ORAL
  Filled 2024-05-30 (×3): qty 1

## 2024-05-30 MED ORDER — ONDANSETRON HCL 4 MG/2ML IJ SOLN
4.0000 mg | Freq: Four times a day (QID) | INTRAMUSCULAR | Status: DC | PRN
  Administered 2024-06-01: 4 mg via INTRAVENOUS
  Filled 2024-05-30: qty 2

## 2024-05-30 MED ORDER — ACETAMINOPHEN 10 MG/ML IV SOLN: 1000.0000 mg | Freq: Once | INTRAVENOUS | Status: DC | PRN

## 2024-05-30 MED ORDER — BUPIVACAINE HCL (PF) 0.5 % IJ SOLN
INTRAMUSCULAR | Status: DC | PRN
  Administered 2024-05-30 (×2): 2.8 mL

## 2024-05-30 MED ORDER — ACETAMINOPHEN 325 MG PO TABS
325.0000 mg | ORAL_TABLET | Freq: Four times a day (QID) | ORAL | Status: DC | PRN
  Administered 2024-05-31: 650 mg via ORAL
  Filled 2024-05-30: qty 2

## 2024-05-30 MED ORDER — SENNOSIDES-DOCUSATE SODIUM 8.6-50 MG PO TABS
1.0000 | ORAL_TABLET | Freq: Two times a day (BID) | ORAL | Status: DC
  Administered 2024-05-30 – 2024-06-02 (×7): 1 via ORAL
  Filled 2024-05-30 (×6): qty 1

## 2024-05-30 MED ORDER — PHENYLEPHRINE HCL-NACL 20-0.9 MG/250ML-% IV SOLN
INTRAVENOUS | Status: AC
  Filled 2024-05-30: qty 250

## 2024-05-30 MED ORDER — OXYCODONE HCL 5 MG PO TABS
ORAL_TABLET | ORAL | Status: AC
  Filled 2024-05-30: qty 1

## 2024-05-30 MED ORDER — TRANEXAMIC ACID-NACL 1000-0.7 MG/100ML-% IV SOLN
1000.0000 mg | Freq: Once | INTRAVENOUS | Status: AC
  Administered 2024-05-30 (×2): 1000 mg via INTRAVENOUS

## 2024-05-30 MED ORDER — CEFAZOLIN SODIUM-DEXTROSE 2-4 GM/100ML-% IV SOLN
INTRAVENOUS | Status: AC
  Filled 2024-05-30: qty 100

## 2024-05-30 MED ORDER — SODIUM CHLORIDE 0.9 % IV SOLN: INTRAVENOUS | Status: DC

## 2024-05-30 MED ORDER — PROPOFOL 1000 MG/100ML IV EMUL
INTRAVENOUS | Status: AC
  Filled 2024-05-30: qty 100

## 2024-05-30 MED ORDER — GABAPENTIN 300 MG PO CAPS
ORAL_CAPSULE | ORAL | Status: AC
  Filled 2024-05-30: qty 1

## 2024-05-30 MED ORDER — GLYCOPYRROLATE 0.2 MG/ML IJ SOLN
INTRAMUSCULAR | Status: AC
  Filled 2024-05-30: qty 1

## 2024-05-30 MED ORDER — PROPOFOL 500 MG/50ML IV EMUL
INTRAVENOUS | Status: DC | PRN
Start: 2024-05-30 — End: 2024-05-30
  Administered 2024-05-30: 10 mg via INTRAVENOUS
  Administered 2024-05-30: 25 ug/kg/min via INTRAVENOUS
  Administered 2024-05-30: 10 mg via INTRAVENOUS
  Administered 2024-05-30: 30 mg via INTRAVENOUS
  Administered 2024-05-30: 25 ug/kg/min via INTRAVENOUS
  Administered 2024-05-30: 30 mg via INTRAVENOUS

## 2024-05-30 MED ORDER — ACETAMINOPHEN 10 MG/ML IV SOLN
INTRAVENOUS | Status: AC
  Filled 2024-05-30: qty 100

## 2024-05-30 MED ORDER — OXYCODONE HCL 5 MG/5ML PO SOLN: 5.0000 mg | Freq: Once | ORAL | Status: AC | PRN

## 2024-05-30 MED ORDER — SODIUM CHLORIDE 0.9 % IR SOLN
Status: DC | PRN
  Administered 2024-05-30 (×2): 3000 mL

## 2024-05-30 MED ORDER — MENTHOL 3 MG MT LOZG
1.0000 | LOZENGE | OROMUCOSAL | Status: DC | PRN
  Filled 2024-05-30: qty 9

## 2024-05-30 MED ORDER — DROPERIDOL 2.5 MG/ML IJ SOLN: 0.6250 mg | Freq: Once | INTRAMUSCULAR | Status: DC | PRN

## 2024-05-30 MED ORDER — FENTANYL CITRATE (PF) 100 MCG/2ML IJ SOLN
INTRAMUSCULAR | Status: AC
  Filled 2024-05-30: qty 2

## 2024-05-30 MED ORDER — TRAMADOL HCL 50 MG PO TABS
50.0000 mg | ORAL_TABLET | ORAL | Status: DC | PRN
  Administered 2024-05-31: 50 mg via ORAL
  Filled 2024-05-30: qty 1

## 2024-05-30 MED ORDER — OXYCODONE HCL 5 MG PO TABS
10.0000 mg | ORAL_TABLET | ORAL | Status: DC | PRN
  Administered 2024-05-30 (×2): 10 mg via ORAL
  Filled 2024-05-30: qty 2

## 2024-05-30 MED ORDER — DIPHENHYDRAMINE HCL 12.5 MG/5ML PO ELIX: 12.5000 mg | ORAL_SOLUTION | ORAL | Status: DC | PRN

## 2024-05-30 MED ORDER — ACETAMINOPHEN 10 MG/ML IV SOLN
INTRAVENOUS | Status: DC | PRN
  Administered 2024-05-30 (×2): 1000 mg via INTRAVENOUS

## 2024-05-30 MED ORDER — ENSURE PRE-SURGERY PO LIQD
296.0000 mL | Freq: Once | ORAL | Status: AC
  Administered 2024-05-30 (×2): 296 mL via ORAL
  Filled 2024-05-30: qty 296

## 2024-05-30 MED ORDER — CEFAZOLIN SODIUM-DEXTROSE 2-4 GM/100ML-% IV SOLN
2.0000 g | Freq: Four times a day (QID) | INTRAVENOUS | Status: AC
  Administered 2024-05-30 (×4): 2 g via INTRAVENOUS
  Filled 2024-05-30: qty 100

## 2024-05-30 MED ORDER — 0.9 % SODIUM CHLORIDE (POUR BTL) OPTIME
TOPICAL | Status: DC | PRN
  Administered 2024-05-30 (×2): 500 mL

## 2024-05-30 MED ORDER — DEXAMETHASONE SODIUM PHOSPHATE 10 MG/ML IJ SOLN
INTRAMUSCULAR | Status: AC
  Filled 2024-05-30: qty 1

## 2024-05-30 MED ORDER — ONDANSETRON HCL 4 MG PO TABS
4.0000 mg | ORAL_TABLET | Freq: Four times a day (QID) | ORAL | Status: DC | PRN
  Administered 2024-05-31 – 2024-06-01 (×2): 4 mg via ORAL
  Filled 2024-05-30 (×3): qty 1

## 2024-05-30 MED ORDER — ACETAMINOPHEN 10 MG/ML IV SOLN
1000.0000 mg | Freq: Four times a day (QID) | INTRAVENOUS | Status: AC
  Administered 2024-05-30 – 2024-05-31 (×5): 1000 mg via INTRAVENOUS
  Filled 2024-05-30 (×2): qty 100

## 2024-05-30 MED ORDER — PHENYLEPHRINE HCL-NACL 20-0.9 MG/250ML-% IV SOLN
INTRAVENOUS | Status: DC | PRN
  Administered 2024-05-30 (×2): 50 ug/min via INTRAVENOUS

## 2024-05-30 MED ORDER — TRANEXAMIC ACID-NACL 1000-0.7 MG/100ML-% IV SOLN
INTRAVENOUS | Status: AC
  Filled 2024-05-30: qty 100

## 2024-05-30 MED ORDER — METOCLOPRAMIDE HCL 5 MG PO TABS
10.0000 mg | ORAL_TABLET | Freq: Three times a day (TID) | ORAL | Status: AC
  Administered 2024-05-30 – 2024-06-01 (×9): 10 mg via ORAL
  Filled 2024-05-30 (×2): qty 1
  Filled 2024-05-30: qty 2
  Filled 2024-05-30 (×4): qty 1

## 2024-05-30 MED ORDER — FENTANYL CITRATE (PF) 100 MCG/2ML IJ SOLN
25.0000 ug | INTRAMUSCULAR | Status: DC | PRN
  Administered 2024-05-30 (×2): 25 ug via INTRAVENOUS

## 2024-05-30 MED ORDER — ASPIRIN 81 MG PO CHEW
81.0000 mg | CHEWABLE_TABLET | Freq: Two times a day (BID) | ORAL | Status: DC
  Administered 2024-05-30 – 2024-06-02 (×7): 81 mg via ORAL
  Filled 2024-05-30 (×6): qty 1

## 2024-05-30 MED ORDER — CELECOXIB 200 MG PO CAPS
200.0000 mg | ORAL_CAPSULE | Freq: Two times a day (BID) | ORAL | Status: DC
  Administered 2024-05-30 – 2024-06-02 (×7): 200 mg via ORAL
  Filled 2024-05-30 (×6): qty 1

## 2024-05-30 MED ORDER — MUPIROCIN 2 % EX OINT: 1.0000 | TOPICAL_OINTMENT | Freq: Two times a day (BID) | CUTANEOUS | 0 refills | Status: AC

## 2024-05-30 MED ORDER — FLEET ENEMA RE ENEM
1.0000 | ENEMA | Freq: Once | RECTAL | Status: AC | PRN
  Administered 2024-06-02: 1 via RECTAL

## 2024-05-30 MED ORDER — FENTANYL CITRATE (PF) 100 MCG/2ML IJ SOLN
INTRAMUSCULAR | Status: DC | PRN
  Administered 2024-05-30 (×8): 25 ug via INTRAVENOUS

## 2024-05-30 MED ORDER — OXYCODONE HCL 5 MG PO TABS
5.0000 mg | ORAL_TABLET | Freq: Once | ORAL | Status: AC | PRN
  Administered 2024-05-30 (×2): 5 mg via ORAL

## 2024-05-30 MED ORDER — GLYCOPYRROLATE 0.2 MG/ML IJ SOLN
INTRAMUSCULAR | Status: DC | PRN
Start: 2024-05-30 — End: 2024-05-30
  Administered 2024-05-30 (×4): .2 mg via INTRAVENOUS

## 2024-05-30 MED ORDER — MAGNESIUM HYDROXIDE 400 MG/5ML PO SUSP
30.0000 mL | Freq: Every day | ORAL | Status: DC
  Administered 2024-06-01 – 2024-06-02 (×2): 30 mL via ORAL
  Filled 2024-05-30 (×3): qty 30

## 2024-05-30 MED ORDER — SERTRALINE HCL 50 MG PO TABS
100.0000 mg | ORAL_TABLET | Freq: Every day | ORAL | Status: DC
  Administered 2024-05-31 – 2024-06-02 (×3): 100 mg via ORAL
  Filled 2024-05-30 (×3): qty 2

## 2024-05-30 MED ORDER — CELECOXIB 200 MG PO CAPS
ORAL_CAPSULE | ORAL | Status: AC
Start: 2024-05-30 — End: 2024-05-30
  Filled 2024-05-30: qty 2

## 2024-05-30 MED ORDER — HYDROMORPHONE HCL 1 MG/ML IJ SOLN: 0.5000 mg | INTRAMUSCULAR | Status: DC | PRN

## 2024-05-30 MED ORDER — FERROUS SULFATE 325 (65 FE) MG PO TABS
325.0000 mg | ORAL_TABLET | Freq: Two times a day (BID) | ORAL | Status: DC
  Administered 2024-05-31 – 2024-06-02 (×5): 325 mg via ORAL
  Filled 2024-05-30 (×5): qty 1

## 2024-05-30 MED ORDER — SURGIPHOR WOUND IRRIGATION SYSTEM - OPTIME: TOPICAL | Status: DC | PRN

## 2024-05-30 MED ORDER — CHLORHEXIDINE GLUCONATE 0.12 % MT SOLN
OROMUCOSAL | Status: AC
  Filled 2024-05-30: qty 15

## 2024-05-30 MED ORDER — ONDANSETRON HCL 4 MG/2ML IJ SOLN
INTRAMUSCULAR | Status: DC | PRN
  Administered 2024-05-30 (×2): 4 mg via INTRAVENOUS

## 2024-05-30 MED ORDER — PANTOPRAZOLE SODIUM 40 MG PO TBEC
40.0000 mg | DELAYED_RELEASE_TABLET | Freq: Two times a day (BID) | ORAL | Status: DC
  Administered 2024-05-30 – 2024-06-02 (×7): 40 mg via ORAL
  Filled 2024-05-30 (×6): qty 1

## 2024-05-30 MED ORDER — ALUM & MAG HYDROXIDE-SIMETH 200-200-20 MG/5ML PO SUSP: 30.0000 mL | ORAL | Status: DC | PRN

## 2024-05-30 MED ORDER — BISACODYL 10 MG RE SUPP: 10.0000 mg | Freq: Every day | RECTAL | Status: DC | PRN

## 2024-05-30 MED ORDER — CHLORHEXIDINE GLUCONATE 4 % EX SOLN: 1.0000 | CUTANEOUS | 1 refills | Status: AC

## 2024-05-30 SURGICAL SUPPLY — 54 items
BIT DRILL Q/COUPLING 1 (BIT) IMPLANT
BRUSH SCRUB EZ PLAIN DRY (MISCELLANEOUS) ×1 IMPLANT
CNTNR URN SCR LID CUP LEK RST (MISCELLANEOUS) IMPLANT
COVER LIGHT HANDLE STERIS (MISCELLANEOUS) IMPLANT
DRAPE INCISE IOBAN 66X60 STRL (DRAPES) ×1 IMPLANT
DRAPE SHEET LG 3/4 BI-LAMINATE (DRAPES) ×2 IMPLANT
DRAPE TABLE BACK 80X90 (DRAPES) ×1 IMPLANT
DRSG AQUACEL AG ADV 3.5X14 (GAUZE/BANDAGES/DRESSINGS) ×1 IMPLANT
DRSG MEPILEX SACRM 8.7X9.8 (GAUZE/BANDAGES/DRESSINGS) ×1 IMPLANT
DRSG TEGADERM 4X4.75 (GAUZE/BANDAGES/DRESSINGS) ×1 IMPLANT
DURAPREP 26ML APPLICATOR (WOUND CARE) ×2 IMPLANT
ELECT CAUTERY BLADE 6.4 (BLADE) ×1 IMPLANT
ELECTRODE REM PT RTRN 9FT ADLT (ELECTROSURGICAL) ×1 IMPLANT
EVACUATOR 1/8 PVC DRAIN (DRAIN) IMPLANT
GAUZE XEROFORM 1X8 LF (GAUZE/BANDAGES/DRESSINGS) ×1 IMPLANT
GLOVE BIO SURGEON STRL SZ7.5 (GLOVE) ×4 IMPLANT
GLOVE BIOGEL PI IND STRL 8 (GLOVE) ×2 IMPLANT
GOWN STRL REUS W/ TWL XL LVL3 (GOWN DISPOSABLE) ×1 IMPLANT
GOWN TOGA ZIPPER T7+ PEEL AWAY (MISCELLANEOUS) ×1 IMPLANT
HANDLE YANKAUER SUCT OPEN TIP (MISCELLANEOUS) ×1 IMPLANT
HANDPIECE VERSAJET DEBRIDEMENT (MISCELLANEOUS) IMPLANT
HEAD FEM LRG 32X+11 (Hips) IMPLANT
HEAD FEM STD 32X+9 STRL (Hips) IMPLANT
HOLDER FOLEY CATH W/STRAP (MISCELLANEOUS) ×1 IMPLANT
HOLSTER ELECTROSUGICAL PENCIL (MISCELLANEOUS) IMPLANT
HOOD PEEL AWAY T7 (MISCELLANEOUS) ×1 IMPLANT
KIT PEG BOARD PINK (KITS) ×1 IMPLANT
LINER CMTLS DURALOC 32X56 0D (Liner) IMPLANT
MANIFOLD NEPTUNE II (INSTRUMENTS) ×2 IMPLANT
NDL SAFETY ECLIPSE 18X1.5 (NEEDLE) IMPLANT
NS IRRIG 500ML POUR BTL (IV SOLUTION) ×1 IMPLANT
PACK HIP PROSTHESIS (MISCELLANEOUS) ×1 IMPLANT
PENCIL SMOKE EVACUATOR COATED (MISCELLANEOUS) ×1 IMPLANT
RING LOCK ACET OD 56/68 (Hips) IMPLANT
SOL .9 NS 3000ML IRR UROMATIC (IV SOLUTION) ×1 IMPLANT
SOLUTION IRRIG SURGIPHOR (IV SOLUTION) ×1 IMPLANT
SPONGE DRAIN TRACH 4X4 STRL 2S (GAUZE/BANDAGES/DRESSINGS) ×1 IMPLANT
SPONGE T-LAP 18X18 ~~LOC~~+RFID (SPONGE) IMPLANT
STAPLER SKIN PROX 35W (STAPLE) ×1 IMPLANT
SUCTION TUBE FRAZIER 10FR DISP (SUCTIONS) ×1 IMPLANT
SUT ETHIBOND #5 BRAIDED 30INL (SUTURE) ×1 IMPLANT
SUT VIC AB 0 CT1 36 (SUTURE) ×1 IMPLANT
SUT VIC AB 1 CT1 36 (SUTURE) ×2 IMPLANT
SUT VIC AB 2-0 CT1 TAPERPNT 27 (SUTURE) ×1 IMPLANT
SWAB CULTURE AMIES ANAERIB BLU (MISCELLANEOUS) IMPLANT
SYR 20ML LL LF (SYRINGE) IMPLANT
TAPE CLOTH 3X10 WHT NS LF (GAUZE/BANDAGES/DRESSINGS) ×1 IMPLANT
TIP BRUSH PULSAVAC PLUS 24.33 (MISCELLANEOUS) IMPLANT
TIP FAN IRRIG PULSAVAC PLUS (DISPOSABLE) ×1 IMPLANT
TOWEL OR 17X26 4PK STRL BLUE (TOWEL DISPOSABLE) ×1 IMPLANT
TRAP FLUID SMOKE EVACUATOR (MISCELLANEOUS) ×1 IMPLANT
TRAY FOLEY MTR SLVR 16FR STAT (SET/KITS/TRAYS/PACK) ×1 IMPLANT
TUBING CONNECTING 10 (TUBING) IMPLANT
WATER STERILE IRR 1000ML POUR (IV SOLUTION) ×1 IMPLANT

## 2024-05-30 NOTE — H&P (Signed)
 ORTHOPAEDIC HISTORY & PHYSICAL Drake Fonda L[place], [place] - 05/23/2024 3:30 PM EDT Formatting of this note is different from the original. NAME: Yvonne Singleton H&P Date: 05/23/2024 Procedure Date: 05/30/2024  Chief Complaint: Recurrent left hip dislocations  HPI Yvonne Singleton is a 89 y.o. female who has severe Left hip dislocations. Patient has a history of a left total hip arthroplasty in December 1999, however underwent a revision in 2005 secondary to recurrent dislocations. She reportedly did okay for a number of years without having any repeat issues, however her most recent dislocation was on 03/06/2024, and happened while she was sitting in a wheelchair. Patient has a known history of Parkinson's and peripheral neuropathy which greatly limits her mobility. She presents today in a wheelchair for assistance. She does live at [facility] assisted living community. She has failed conservative treatment including physical therapy and activity modification. She has requested operative intervention for relief of her recurrent dislocations. Patient does have a previous cardiac history significant for diastolic dysfunction with multiple valvular heart disease with underlying heart failure. She denies having any previous pulmonary issues. No previous DVTs or clots. Patient is not a diabetic. She has had 2 previous surgeries on her hip with her most recent one in 2005 as noted above.  Social Hx: Patient lives at [facility] assisted living facility. She will be looking most likely for rehabilitation postoperatively. She denies any alcohol use, nicotine use, illicit drug use or smoking.  Medications & Allergies Allergies: Allergies Allergen Reactions Citric Acid Unknown Unknown reaction per patient Iodinated Contrast Media Hives and Rash Streptomycin Hives Digoxin Other (See Comments) Pt felt bad and lethargic    Home Medicines: Current Outpatient Medications on File Prior to Visit Medication  Sig Dispense Refill acetaminophen  (TYLENOL ) 500 MG tablet Take 500 mg by mouth every 6 (six) hours as needed alpha lipoic acid 600 mg tablet Take 1 tablet (600 mg total) by mouth once daily 60 tablet 5 calcium  carbonate-vitamin D3 (CALTRATE 600+D) 600 mg-10 mcg (400 unit) tablet Take 1 tablet by mouth 2 (two) times daily with meals FUROsemide  (LASIX ) 20 MG tablet Take 20 mg by mouth once daily gabapentin  (NEURONTIN ) 300 MG capsule Take 300 mg by mouth 2 (two) times daily latanoprost  (XALATAN ) 0.005 % ophthalmic solution Place 1 drop into both eyes at bedtime lisinopriL  (ZESTRIL ) 2.5 MG tablet Take 2.5 mg by mouth once daily oxyCODONE  (ROXICODONE ) 5 MG immediate release tablet Take 1 tablet (5 mg total) by mouth 3 (three) times daily as needed 30 tablet 0 pantoprazole  (PROTONIX ) 40 MG DR tablet Take 1 tablet (40 mg total) by mouth 2 (two) times daily before meals Take 30 min before meal 180 tablet 3 sertraline  (ZOLOFT ) 100 MG tablet Take 1 tablet (100 mg total) by mouth at bedtime 90 tablet 3 trimethoprim  100 mg tablet Take 100 mg by mouth once daily carbidopa -levodopa  (SINEMET  CR) 50-200 mg CR tablet Take 1 tablet by mouth 3 (three) times daily 270 tablet 1  No current facility-administered medications on file prior to visit.  Medical / Surgical History  Past Medical History: Diagnosis Date Arthritis Breast cancer (CMS/HHS-HCC) 2006 Cataracts, bilateral Chickenpox Chronic back pain Diverticulosis 12/06/2014 [place] colon GERD (gastroesophageal reflux disease) Glaucoma Hemorrhoids Hyperlipidemia Hypertension Inflammatory arthritis (GWK) 05/13/2014 a. Positive FANA, negative rheumatoid factor. b. Question pseudogout. c. Chronic steroids. Kidney stones Obesity Osteoporosis, post-menopausal Parkinson disease, symptomatic (CMS/HHS-HCC) Parkinson's disease (CMS/HHS-HCC) diagnosed 02/2012 Rheumatoid arthritis (CMS/HHS-HCC)   Past Surgical History: Procedure Laterality Date Left  total hip arthroplasty  09/19/1998 Left total hip revision arthroplasty 08/23/2004 COLONOSCOPY 03/21/2005 Right total knee arthroplasty 10/26/2006 Cataracts removed Bilateral 2009 Right total hip replacement 08/13/2008 closed reduction Closed reduction of dislocated right total hip arthroplasty Right 05/15/2009 Right hip revision arthroplasty Right 06/25/2009 using a constrained polyethylene insert COLONOSCOPY 12/06/2014 Diverticulosis/No Repeat/PYO Closed reduction of a deslocated left total hip arthroplasty 06/24/2015 Dr Mardee Closed reduction of dislocated left total hip arthroplasty 02/09/2016 Dr Kayla Pinal EGD 05/25/2017 GERD/No Repeat/MUS Closed reduction of left prosthetic hip dislocation Left 10/31/2021 Dr.Poggi Right hip revision arthroplasty 04/08/2022 Dr. Kathlynn Closed reduction of the dislocated left total hip arthroplasty 07/05/2022 Dr Mardee Closed Reduction of left prosthetic hip dislocation Left 05/16/2023 Dr.Poggi BACK SURGERY L2-3 discectomy 12/2000 BREAST EXCISIONAL BIOPSY Right 04/2006 CHOLECYSTECTOMY ENDOSCOPIC CARPAL TUNNEL RELEASE Bilateral HYSTERECTOMY KNEE ARTHROSCOPY Bilateral Left total knee arthroplasty 1993 TONSILLECTOMY AND ADENOIDECTOMY Adenoidectomy   Physical Exam  Ht:157.5 cm (5' 2) Wt:73.5 kg (162 lb) BMI: Body mass index is 29.63 kg/m.  General/Constitutional: No apparent distress: well-nourished and well developed. Eyes: Pupils equal, round with synchronous movement. Lymphatic: No palpable adenopathy. Respiratory: Patient has good chest rise and fall with inspiration and expiration. All lung fields are clear to auscultation bilaterally. There is no Rales, rhonchi or wheezes appreciated. Cardiovascular: Upon auscultation there is a regular rate and rhythm without any murmurs, rubs, gallops or heaves appreciated. There does not appear to be any swelling down the lower extremities. Posterior tibial pulses appreciated bilaterally,  2+. Integumentary: No impressive skin lesions present, except as noted in detailed exam. Neuro/Psych: Normal mood and affect, oriented to person, place and time. Musculoskeletal: see exam below  Left hip exam Left Hip:  Upon inspection of the patient's left hip, there is a well-healed incision along the posterior lateral margins. No noticeable drainage, erythema or deformity.  Pelvic tilt: Negative Limb lengths: Appears relatively normal, however patient has great difficulty standing Soft tissue swelling: Negative Erythema: Negative Crepitance: Negative Tenderness: Greater trochanter is nontender to palpation. No pain is elicited by axial compression or extremes of rotation. Atrophy: No atrophy. Poor to fair hip flexor and abductor strength. Range of Motion: EXT/FLEX: -/90 ADD/ABD: -/25 IR/ER: 10/25, most likely could go further, however did not want to put patient at risk for dislocating  Patient is neurovascularly intact to all dermatomes extending down there Left lower extremity to all dermatomes. Posterior tibial pulses were appreciated, 2+.  Imaging Left Hip Imaging: None ordered today. Previous images from 04/17/2024 were reviewed. Total hip implants appear to be in appropriate position without any signs of loosening or osteolytic wear. No heterotropic ossification present. No fractures, lytic lesions or gross deformities appreciated on films.  Assesment and Plan Recurrent dislocations with left hip arthroplasty  I have recommended that Yvonne Singleton undergo left total hip revision. Consents has been signed. The risks, benefits, prognosis and alternatives including but not limited to DVT, PE, infection, neurovascular injury, failure of the procedure and death were explained to the patient and she is willing to proceed with surgery as described to her by myself. Plan will be for post operative admission of at least 1 midnight for pain control and PT. She will be managed with DVT  prophylaxis, antibiotics preoperatively for 24 hours and aggressive in patient rehab.  Pre, intra and post op interventions were discussed. Patient has good understanding  Medication Reconciliation was performed. Discussed cessation of vitamins and supplements.  A total of 45 minutes was spent reviewing patient's charts, medical reconciliation, discussing/educating the patient about surgical interventions, and  answering any questions provided by the patient.  JOSHUA DALLAS KOYANAGI, PA [facility] 05/23/2024  Electronically signed by KOYANAGI Fonda DALLAS, PA at 05/23/2024 4:37 PM EDT

## 2024-05-30 NOTE — Anesthesia Postprocedure Evaluation (Signed)
 Anesthesia Post Note  Patient: Yvonne Singleton  Procedure(s) Performed: REVISION, ARTHROPLASTY, HIP (Left: Hip)  Patient location during evaluation: PACU Anesthesia Type: Spinal Level of consciousness: awake and alert Pain management: pain level controlled Vital Signs Assessment: post-procedure vital signs reviewed and stable Respiratory status: spontaneous breathing and respiratory function stable Cardiovascular status: blood pressure returned to baseline and stable Postop Assessment: spinal receding, no headache, no backache and no apparent nausea or vomiting Anesthetic complications: no   There were no known notable events for this encounter.   Last Vitals:  Vitals:   05/30/24 1615 05/30/24 1630  BP: 132/60 (!) 156/69  Pulse: (!) 54 60  Resp: 11 13  Temp:    SpO2: 100% 95%    Last Pain:  Vitals:   05/30/24 1636  TempSrc:   PainSc: 4                  Jenniah Bhavsar

## 2024-05-30 NOTE — Anesthesia Preprocedure Evaluation (Signed)
 Anesthesia Evaluation  Patient identified by MRN, date of birth, ID band Patient awake    Reviewed: Allergy & Precautions, H&P , NPO status , Patient's Chart, lab work & pertinent test results, reviewed documented beta blocker date and time   Airway Mallampati: II   Neck ROM: full    Dental  (+) Poor Dentition   Pulmonary neg pulmonary ROS   Pulmonary exam normal        Cardiovascular Exercise Tolerance: Poor hypertension, On Medications negative cardio ROS Normal cardiovascular exam Rhythm:regular Rate:Normal     Neuro/Psych  Headaches PSYCHIATRIC DISORDERS  Depression   Dementia  Neuromuscular disease    GI/Hepatic Neg liver ROS, hiatal hernia,GERD  Medicated,,  Endo/Other  negative endocrine ROS    Renal/GU Renal disease  negative genitourinary   Musculoskeletal   Abdominal   Peds  Hematology negative hematology ROS (+)   Anesthesia Other Findings Past Medical History: 05/12/2022: Acute metabolic encephalopathy No date: Aortic atherosclerosis (HCC) No date: Back pain 2007: Breast cancer, right (HCC)     Comment:  a.) Tx'd with partial mastectomy (lumpectomy) + adjuvant              XRT No date: Cerebral microvascular disease No date: CKD (chronic kidney disease) stage 3, GFR 30-59 ml/min (HCC) No date: Collagen vascular disease (HCC) No date: Depression No date: Diastolic dysfunction No date: Fatty liver No date: GERD (gastroesophageal reflux disease) No date: Glaucoma No date: Gout No date: Hard of hearing 05/16/2022: Headache No date: History of bilateral hip replacements No date: History of hiatal hernia No date: Hyperlipidemia No date: Hypertension No date: Lives in assisted living facility     Comment:  [place] No date: Macular degeneration No date: Parkinson's disease (HCC) No date: Peripheral neuropathy No date: RA (rheumatoid arthritis) (HCC) No date: Recurrent dislocation of  hip No date: Spinal stenosis of lumbar region No date: Ventral hernia No date: Vitamin B 12 deficiency Past Surgical History: No date: ABDOMINAL HYSTERECTOMY 04/08/2022: ANTERIOR HIP REVISION; Right     Comment:  Procedure: Anterior hip revision cup and femoral head;                Surgeon: Kathlynn Sharper, MD;  Location: [facility];                Service: Orthopedics;  Laterality: Right; 2007: BREAST LUMPECTOMY; Right     Comment:  suspicious calcs, f/u with radiation No date: CATARACT EXTRACTION; Bilateral 05/25/2017: ESOPHAGOGASTRODUODENOSCOPY (EGD) WITH PROPOFOL ; N/A     Comment:  Procedure: ESOPHAGOGASTRODUODENOSCOPY (EGD) WITH               PROPOFOL ;  Surgeon: Gaylyn Gladis PENNER, MD;  Location:               [facility];  Service: Endoscopy;  Laterality: N/A; 05/09/2018: ESOPHAGOGASTRODUODENOSCOPY (EGD) WITH PROPOFOL ; N/A     Comment:  Procedure: ESOPHAGOGASTRODUODENOSCOPY (EGD) WITH               PROPOFOL ;  Surgeon: Unk Corinn Skiff, MD;  Location:               [facility];  Service: Gastroenterology;  Laterality:               N/A; 12/09/2021: ESOPHAGOGASTRODUODENOSCOPY (EGD) WITH PROPOFOL ; N/A     Comment:  Procedure: ESOPHAGOGASTRODUODENOSCOPY (EGD) WITH               PROPOFOL ;  Surgeon: Unk Corinn Skiff, MD;  Location:  [facility];  Service: Gastroenterology;  Laterality:               N/A; No date: EYE SURGERY; Bilateral No date: HAND SURGERY 06/24/2015: HIP CLOSED REDUCTION; Left     Comment:  Procedure: CLOSED REDUCTION HIP;  Surgeon: Lynwood SHAUNNA Hue, MD;  Location: [facility];  Service: Orthopedics;                Laterality: Left; 02/09/2016: HIP CLOSED REDUCTION; Left     Comment:  Procedure: CLOSED MANIPULATION HIP;  Surgeon: Kayla Pinal, MD;  Location: [facility];  Service: Orthopedics;                Laterality: Left; 10/31/2021: HIP CLOSED REDUCTION; Left     Comment:  Procedure: CLOSED REDUCTION HIP;  Surgeon:  Edie Norleen PARAS, MD;  Location: [facility];  Service: Orthopedics;                Laterality: Left; 07/05/2022: HIP CLOSED REDUCTION; Left     Comment:  Procedure: CLOSED REDUCTION HIP;  Surgeon: Hue Lynwood SHAUNNA, MD;  Location: [facility];  Service: Orthopedics;                Laterality: Left; 01/17/2023: HIP CLOSED REDUCTION; Left     Comment:  Procedure: CLOSED REDUCTION HIP;  Surgeon: Lorelle Hussar, MD;  Location: [facility];  Service: Orthopedics;               Laterality: Left; 05/17/2023: HIP CLOSED REDUCTION; Left     Comment:  Procedure: CLOSED REDUCTION HIP;  Surgeon: Edie Norleen PARAS, MD;  Location: [facility];  Service: Orthopedics;                Laterality: Left; 03/07/2024: HIP CLOSED REDUCTION; Left     Comment:  Procedure: CLOSED REDUCTION, HIP;  Surgeon: Tobie Priest, MD;  Location: [facility];  Service: Orthopedics;                Laterality: Left; No date: HIP SURGERY No date: JOINT REPLACEMENT     Comment:  bilateral hip No date: OOPHORECTOMY No date: TOTAL KNEE ARTHROPLASTY; Bilateral BMI    Body Mass Index: 32.24 kg/m     Reproductive/Obstetrics negative OB ROS                              Anesthesia Physical Anesthesia Plan  ASA: 3  Anesthesia Plan: Spinal   Post-op Pain Management:    Induction:   PONV Risk Score and Plan: 3  Airway Management Planned:   Additional Equipment:   Intra-op Plan:   Post-operative Plan:   Informed Consent: I have reviewed the patients History and Physical, chart, labs and discussed the procedure including the risks, benefits and alternatives for the proposed anesthesia with the patient or authorized representative who has indicated his/her understanding and acceptance.  Dental Advisory Given  Plan Discussed with: CRNA  Anesthesia Plan Comments:         Anesthesia Quick Evaluation

## 2024-05-30 NOTE — Interval H&P Note (Signed)
 History and Physical Interval Note:  05/30/2024 11:24 AM  Yvonne Singleton  has presented today for surgery, with the diagnosis of Closed dislocation of left hip, subsequent encounter S73.005D.  The various methods of treatment have been discussed with the patient and family. After consideration of risks, benefits and other options for treatment, the patient has consented to  Procedure(s): REVISION, ARTHROPLASTY, HIP (Left) as a surgical intervention.  The patient's history has been reviewed, patient examined, no change in status, stable for surgery.  I have reviewed the patient's chart and labs.  Questions were answered to the patient's satisfaction.     Kaveon Blatz P Evani Shrider

## 2024-05-30 NOTE — Op Note (Signed)
 OPERATIVE NOTE  DATE OF SURGERY:  05/30/2024  PATIENT NAME:  Yvonne Singleton   DOB: 03-Apr-1935  MRN: [id number]  PRE-OPERATIVE DIAGNOSIS: Recurrent dislocations of the left total hip arthroplasty  POST-OPERATIVE DIAGNOSIS:  Same  PROCEDURE:  Right hip revision arthroplasty to a constrained liner  SURGEON:  Lynwood SHAUNNA Mardee Mickey. M.D.  ASSISTANT: Sidra Koyanagi, PA-C (present and scrubbed throughout the case, critical for assistance with exposure, retraction, instrumentation, and closure)  ANESTHESIA: spinal  ESTIMATED BLOOD LOSS: 50 mL  FLUIDS REPLACED: 1000 mL of crystalloid  DRAINS: 2 medium Hemovac drains  IMPLANTS UTILIZED: DePuy 56 mm Duraloc dynamic locking ring, constrained polyethylene insert (32 mm ID, 56 mm OD), and a 32 mm CoCr +9 mm hip ball  INDICATIONS FOR SURGERY: Yvonne Singleton is a 89 y.o. year old female who had a left total hip arthroplasty approximately 30 years ago.  She has had a history of multiple recurrent dislocations of the left hip.  After discussion of the risks and benefits of surgical intervention, the patient expressed understanding of the risks benefits and agree with plans for hip revision arthroplasty to a constrained liner.   The risks, benefits, and alternatives were discussed at length including but not limited to the risks of infection, bleeding, nerve injury, stiffness, blood clots, the need for revision surgery, limb length inequality, dislocation, cardiopulmonary complications, among others, and they were willing to proceed.  PROCEDURE IN DETAIL: The patient was brought into the operating room and, after adequate spinal anesthesia was achieved, the patient was placed in a right lateral decubitus position. Axillary roll was placed and all bony prominences were well-padded. The patient's left hip was cleaned and prepped with alcohol and DuraPrep and draped in the usual sterile fashion. A timeout was performed as per usual protocol. A  lateral curvilinear incision was made gently curving towards the posterior superior iliac spine. The IT band was incised in line with the skin incision and the fibers of the gluteus maximus were split in line.  Inspection of the proximal femur demonstrated chronic avulsion of the gluteus medius tendon.  A T type posterior capsulotomy was performed and the pseudocapsule.  The hip was placed through an extensive range of motion and actually good stability was appreciated with flexion of 90 degrees, internal rotation of 90 degrees, and adduction of 30 degrees.  The femoral head was then dislocated posteriorly.  The femoral head was removed from the trunnion using a bone tamp, taking care to protect the [facility] taper.  The femoral neck was then displaced superiorly and anteriorly so this to adequately visualize the acetabulum.  Fibrotic tissue from around the circumference of the acetabular component was performed using electrocautery.  The polyethylene was drilled and a 6.5 mm cancellous screw was inserted sows to disengage to the polyethylene from the metal shell.  The locking ring was also removed.  Inspection of the polyethylene showed essentially no wear or deformity.  A trial polyethylene was inserted and reduction was attempted using a 32 mm hip ball with a +9 mm neck length.  This allowed for good equalization of limb lengths.  The trial components were removed.  A 56 mm Duraloc locking ring was carefully positioned into the groove.  A 32 mm inner diameter/56 mm outer diameter constrained liner was positioned and impacted into place.  The polyethylene was fully engaged as noted by inspecting the circumference of the acetabular component.  Next, the [facility] taper was cleaned and dried.  A 32 mm cobalt  chrome hip ball with a +9 mm neck segment was placed on the trunnion and impacted in place.  The hip was reduced and then impacted into the constrained liner.  The locking ring was then positioned into the constrained  liner and impacted in place.  The locking ring was inspected around its entire circumference so it is too ensure adequate engagement.  The hip was then placed through range of motion.  Excellent stability was appreciated without gross impingement.  The wound was irrigated with copious amounts of normal saline followed by 450 ml of Surgiphor and suctioned dry. Good hemostasis was appreciated. The posterior pseudocapsule was repaired using #5 Ethibond.  The IT band was reapproximated using interrupted sutures of #1 Vicryl. Subcutaneous tissue was approximated using first #0 Vicryl followed by #2-0 Vicryl. The skin was closed with skin staples.  The patient tolerated the procedure well and was transported to the recovery room in stable condition.   Lynwood SHAUNNA Mardee Mickey., M.D.

## 2024-05-30 NOTE — Anesthesia Procedure Notes (Addendum)
 Spinal  Patient location during procedure: OR Start time: 05/30/2024 12:20 PM End time: 05/30/2024 12:23 PM Reason for block: surgical anesthesia Staffing Performed: anesthesiologist  Anesthesiologist: Myra Lynwood MATSU, MD Resident/CRNA: Lorrene Camelia LABOR, CRNA Performed by: Lorrene Camelia LABOR, CRNA Authorized by: Myra Lynwood MATSU, MD   Preanesthetic Checklist Completed: patient identified, IV checked, site marked, risks and benefits discussed, surgical consent, monitors and equipment checked, pre-op evaluation and timeout performed Spinal Block Patient position: sitting Prep: Betadine Patient monitoring: heart rate, continuous pulse ox, blood pressure and cardiac monitor Approach: midline Location: L4-5 Injection technique: single-shot Needle Needle type: Introducer and Quincke  Needle gauge: 22 G Needle length: 9 cm Assessment Events: CSF return Additional Notes Negative paresthesia. Negative blood return. Positive free-flowing CSF. Expiration date of kit checked and confirmed. Patient tolerated procedure well, without complications.

## 2024-05-30 NOTE — Transfer of Care (Signed)
 Immediate Anesthesia Transfer of Care Note  Patient: Yvonne Singleton  Procedure(s) Performed: REVISION, ARTHROPLASTY, HIP (Left: Hip)  Patient Location: PACU  Anesthesia Type:General  Level of Consciousness: awake and patient cooperative  Airway & Oxygen Therapy: Patient Spontanous Breathing  Post-op Assessment: Report given to RN and Post -op Vital signs reviewed and stable  Post vital signs: stable  Last Vitals:  Vitals Value Taken Time  BP 135/55 05/30/24 15:38  Temp    Pulse 57 05/30/24 15:40  Resp 17 05/30/24 15:40  SpO2 92 % 05/30/24 15:40  Vitals shown include unfiled device data.  Last Pain:  Vitals:   05/30/24 0952  TempSrc: Tympanic  PainSc: 0-No pain         Complications: No notable events documented.

## 2024-05-30 NOTE — Anesthesia Procedure Notes (Signed)
 Procedure Name: MAC Date/Time: 05/30/2024 12:11 PM  Performed by: Lorrene Camelia LABOR, CRNAPre-anesthesia Checklist: Patient identified, Emergency Drugs available, Suction available and Patient being monitored Patient Re-evaluated:Patient Re-evaluated prior to induction Oxygen Delivery Method: Simple face mask Preoxygenation: Pre-oxygenation with 100% oxygen

## 2024-05-31 ENCOUNTER — Encounter: Payer: Self-pay | Admitting: Orthopedic Surgery

## 2024-05-31 MED ORDER — CELECOXIB 200 MG PO CAPS: 200.0000 mg | ORAL_CAPSULE | Freq: Two times a day (BID) | ORAL | 1 refills | Status: AC

## 2024-05-31 MED ORDER — OXYCODONE HCL 5 MG PO TABS: 5.0000 mg | ORAL_TABLET | ORAL | 0 refills | Status: AC | PRN

## 2024-05-31 MED ORDER — ASPIRIN 81 MG PO CHEW: 81.0000 mg | CHEWABLE_TABLET | Freq: Two times a day (BID) | ORAL | 1 refills | Status: AC

## 2024-05-31 MED ORDER — ACETAMINOPHEN 10 MG/ML IV SOLN
INTRAVENOUS | Status: AC
  Filled 2024-05-31: qty 100

## 2024-05-31 MED ORDER — TRAMADOL HCL 50 MG PO TABS: 50.0000 mg | ORAL_TABLET | ORAL | 0 refills | Status: AC | PRN

## 2024-05-31 MED ORDER — ACETAMINOPHEN 10 MG/ML IV SOLN
INTRAVENOUS | Status: AC
Start: 2024-05-31 — End: 2024-05-31
  Filled 2024-05-31: qty 100

## 2024-05-31 NOTE — Plan of Care (Signed)
 Progressing towards discharge

## 2024-05-31 NOTE — Progress Notes (Addendum)
 Subjective: 1 Day Post-Op Procedure(s) (LRB): REVISION, ARTHROPLASTY, HIP (Left) Patient reports pain as mild.   Patient seen in rounds with Dr. Mardee. Patient is well, and has had no acute complaints or problems Denies any CP, SOB, N/V, fevers or chills We will start therapy today.  Plan is to go Home vs SNF vs home assisted living facility after hospital stay.  Objective: Vital signs in last 24 hours: Temp:  [97.2 F (36.2 C)-98 F (36.7 C)] 98 F (36.7 C) (08/14 0747) Pulse Rate:  [50-76] 52 (08/14 0747) Resp:  [11-18] 15 (08/14 0747) BP: (93-156)/(45-100) 106/45 (08/14 0747) SpO2:  [90 %-100 %] 93 % (08/14 0747) Weight:  [82.6 kg] 82.6 kg (08/13 0952)  Intake/Output from previous day:  Intake/Output Summary (Last 24 hours) at 05/31/2024 0803 Last data filed at 05/31/2024 0552 Gross per 24 hour  Intake 2971.67 ml  Output 540 ml  Net 2431.67 ml    Intake/Output this shift: No intake/output data recorded.  Labs: No results for input(s): HGB in the last 72 hours. No results for input(s): WBC, RBC, HCT, PLT in the last 72 hours. No results for input(s): NA, K, CL, CO2, BUN, CREATININE, GLUCOSE, CALCIUM  in the last 72 hours. No results for input(s): LABPT, INR in the last 72 hours.  EXAM General - Patient is Alert, Appropriate, and Oriented Extremity - Neurologically intact ABD soft Neurovascular intact Sensation intact distally Intact pulses distally Dorsiflexion/Plantar flexion intact No cellulitis present Compartment soft Dressing - dressing C/D/I and no drainage Motor Function - intact, moving foot and toes well on exam. JP Drain pulled without difficulty. Intact  Past Medical History:  Diagnosis Date   Acute metabolic encephalopathy 05/12/2022   Aortic atherosclerosis (HCC)    Back pain    Breast cancer, right (HCC) 2007   a.) Tx'd with partial mastectomy (lumpectomy) + adjuvant XRT   Cerebral microvascular disease    CKD  (chronic kidney disease) stage 3, GFR 30-59 ml/min (HCC)    Collagen vascular disease (HCC)    Depression    Diastolic dysfunction    Fatty liver    GERD (gastroesophageal reflux disease)    Glaucoma    Gout    Hard of hearing    Headache 05/16/2022   History of bilateral hip replacements    History of hiatal hernia    Hyperlipidemia    Hypertension    Lives in assisted living facility    [facility]   Macular degeneration    Parkinson's disease [facility])    Peripheral neuropathy    RA (rheumatoid arthritis) (HCC)    Recurrent dislocation of hip    Spinal stenosis of lumbar region    Ventral hernia    Vitamin B 12 deficiency     Assessment/Plan: 1 Day Post-Op Procedure(s) (LRB): REVISION, ARTHROPLASTY, HIP (Left) Principal Problem:   S/P revision of total hip  Estimated body mass index is 32.24 kg/m as calculated from the following:   Height as of this encounter: 5' 3 (1.6 m).   Weight as of this encounter: 82.6 kg. Advance diet Up with therapy  Patient will continue to work with physical therapy to pass postoperative PT protocols, ROM and strengthening  Hip Preacutions  Discussed with the patient continuing to utilize ice over the bandage  Patient will wear TED hose bilaterally to help prevent DVT and clot formation  Discussed the Aquacel bandage.  This bandage will stay in place 7 days postoperatively.  Can be replaced with honeycomb bandages that will  be sent home with the patient  Discussed sending the patient home with tramadol  and oxycodone  for as needed pain management.  Patient will also be sent home with Celebrex  to help with swelling and inflammation.  Patient will take an 81 mg aspirin  twice daily for DVT prophylaxis  JP drain removed without difficulty, intact  Weight-Bearing as tolerated to left leg  Patient will follow-up with [facility] clinic orthopedics in 6 weeks for re-imaging and reevaluation   Fonda Koyanagi, PA-C [facility]  Orthopaedics 05/31/2024, 8:03 AM

## 2024-05-31 NOTE — TOC Initial Note (Signed)
 Transition of Care [facility]) - Initial/Assessment Note    Patient Details  Name: Yvonne Singleton MRN: [id number] Date of Birth: [birth year]  Transition of Care [facility]) CM/SW Contact:    Alvaro Louder, LCSW Phone Number: 05/31/2024, 4:13 PM  Clinical Narrative: LCSWA met with patient at bedside to discuss PT and OT recommendation. LCSWA explained HH vs SNF. Patient and son stated that they want to proceed with SNF opposed to [facility] at ALF [facility]. LCSWA to complete FL2 and fax out information to SNF's in [place].   TOC to follow for Discharge                        Patient Goals and CMS Choice            Expected Discharge Plan and Services                                              Prior Living Arrangements/Services                       Activities of Daily Living   ADL Screening (condition at time of admission) Independently performs ADLs?: Yes (appropriate for developmental age) Is the patient deaf or have difficulty hearing?: No Does the patient have difficulty seeing, even when wearing glasses/contacts?: No Does the patient have difficulty concentrating, remembering, or making decisions?: No  Permission Sought/Granted                  Emotional Assessment              Admission diagnosis:  Closed dislocation of left hip, subsequent encounter [S73.005D] S/P revision of total hip [Z96.649] Patient Active Problem List   Diagnosis Date Noted   S/P revision of total hip 05/30/2024   Anterior dislocation of left hip, initial encounter (HCC) 03/07/2024   Parkinson's disease (HCC) 03/07/2024   Peripheral neuropathy 03/07/2024   Essential hypertension 03/07/2024   Hip dislocation, left (HCC) 05/16/2023   Chronic kidney disease, stage 3a (HCC) 05/16/2023   Hyperkalemia 05/16/2023   Pressure injury of left buttock, stage 1 02/17/2023   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) 01/17/2023   Acute metabolic  encephalopathy 06/29/2022   Essential hypertension, benign 06/29/2022   Vitamin B12 deficiency 05/14/2022   Recurrent dislocation, right hip 04/06/2022   Gout 04/06/2022   Depression 04/06/2022   Paraesophageal hernia    Gastritis without bleeding    Combined rheumatic disorders of mitral, aortic and tricuspid valves 10/18/2021   Dorsalgia, unspecified 10/18/2021   Hypertensive heart disease with heart failure (HCC) 10/18/2021   Personal history of urinary (tract) infections 10/18/2021   Unspecified glaucoma 10/18/2021   Unspecified urinary incontinence 10/18/2021   Ventral hernia without obstruction or gangrene 10/18/2021   Diastolic dysfunction 10/06/2021   Regional enteritis of small bowel (HCC) 10/24/2018   Clostridium difficile infection 10/24/2018   Macular degeneration (senile) of retina 07/24/2018   Lower extremity edema 06/27/2018   Hiatal hernia 06/03/2017   Hyperlipidemia 05/30/2017   Hypertension 05/30/2017   GERD (gastroesophageal reflux disease) 05/30/2017   Kidney stones 05/30/2017   Rupture of tendon of hand 05/30/2017   Parkinson disease (HCC) 10/29/2016   Dementia due to Parkinson's disease without behavioral disturbance (HCC) 10/29/2016   History of bilateral hip replacements 05/28/2015   Spinal stenosis,  lumbar region, with neurogenic claudication 05/28/2015   DDD (degenerative disc disease), lumbar 03/13/2015   Lumbar post-laminectomy syndrome 03/13/2015   Degenerative cervical disc 03/13/2015   Status post bilateral total hip replacement 03/13/2015   Sacroiliac joint dysfunction 03/13/2015   History of surgery on upper extremity 03/13/2015   DJD of shoulder 03/13/2015   Rheumatoid arthritis (HCC) 02/17/2015   Valvular heart disease 04/03/2012   Non-alcoholic fatty liver disease 09/27/2011   Inflammatory polyarthropathy (HCC) 07/07/2011   Personal history of breast cancer 10/18/2005   PCP:  Fernand Fredy RAMAN, MD Pharmacy:   [facility] -  [place], [place] - [address] [place] [address] Phone: [phone] Fax: [phone]     Social Drivers of Health (SDOH) Social History: SDOH Screenings   Food Insecurity: No Food Insecurity (05/30/2024)  Housing: Low Risk  (05/30/2024)  Transportation Needs: No Transportation Needs (05/30/2024)  Utilities: Not At Risk (05/30/2024)  Depression (PHQ2-9): Low Risk  (12/23/2023)  Financial Resource Strain: Low Risk  (04/17/2024)   Received from [facility]  Social Connections: Unknown (03/07/2024)  Tobacco Use: Low Risk  (05/30/2024)   SDOH Interventions:     Readmission Risk Interventions     No data to display

## 2024-05-31 NOTE — Plan of Care (Signed)
?  Problem: Education: ?Goal: Knowledge of the prescribed therapeutic regimen will improve ?Outcome: Progressing ?  ?Problem: Activity: ?Goal: Ability to avoid complications of mobility impairment will improve ?Outcome: Progressing ?  ?Problem: Pain Management: ?Goal: Pain level will decrease with appropriate interventions ?Outcome: Progressing ?  ?Problem: Skin Integrity: ?Goal: Will show signs of wound healing ?Outcome: Progressing ?  ?

## 2024-05-31 NOTE — Evaluation (Signed)
 Physical Therapy Evaluation Patient Details Name: Yvonne Singleton MRN: [id number] DOB: [birth year] Today's Date: 05/31/2024  History of Present Illness  89 y/o female s/p L THA revision on 05/30/24. PMH: HTN, breast cancer, glaucoma, Parkinson's, RA, hx L THA with recurrent dislocations  Clinical Impression  Patient admitted following above procedure. PTA, patient lives at [facility] and required assistance for pivot transfers to/from w/c with RW and assistance with ADLs. Patient presents post op pain and weakness, impaired balance, and decreased activity tolerance. Educated patient on posterior hip precautions with patient able to recall 2/3 at beginning of session from prior surgery. Patient required CGA for bed mobility and minA to stand from EOB and BSC with RW. Performed step pivot transfer with RW and minA+2 for safety. Complaining of 5/10 pain at worst during session. Patient will benefit from skilled PT services during acute stay to address listed deficits. Patient will benefit from ongoing therapy at discharge to maximize functional independence and safety.         If plan is discharge home, recommend the following: A little help with walking and/or transfers;A little help with bathing/dressing/bathroom   Can travel by private vehicle   Yes    Equipment Recommendations Other (comment) (defer to next venue of care)  Recommendations for Other Services       Functional Status Assessment Patient has had a recent decline in their functional status and demonstrates the ability to make significant improvements in function in a reasonable and predictable amount of time.     Precautions / Restrictions Precautions Precautions: Fall;Posterior Hip Precaution Booklet Issued: Yes (comment) Recall of Precautions/Restrictions: Intact Restrictions Weight Bearing Restrictions Per Provider Order: Yes RLE Weight Bearing Per Provider Order: Weight bearing as tolerated       Mobility  Bed Mobility Overal bed mobility: Needs Assistance Bed Mobility: Supine to Sit     Supine to sit: Contact guard, HOB elevated, Used rails     General bed mobility comments: increased time and use of bed rails to complete. CGA for safety and support of L LE intermittently    Transfers Overall transfer level: Needs assistance Equipment used: Rolling Korrine Sicard (2 wheels) Transfers: Sit to/from Stand, Bed to chair/wheelchair/BSC Sit to Stand: Min assist   Step pivot transfers: Min assist, +2 safety/equipment       General transfer comment: Cues for hand placement for standing. Able to take steps to [facility] and void successfully. Transferred from [facility] > recliner with RW    Ambulation/Gait               General Gait Details: non ambulatory at baseline  Stairs            Wheelchair Mobility     Tilt Bed    Modified Rankin (Stroke Patients Only)       Balance Overall balance assessment: Needs assistance Sitting-balance support: No upper extremity supported, Feet supported Sitting balance-Leahy Scale: Good     Standing balance support: Bilateral upper extremity supported, Reliant on assistive device for balance, During functional activity Standing balance-Leahy Scale: Fair                               Pertinent Vitals/Pain Pain Assessment Pain Assessment: 0-10 Pain Score: 5  Pain Location: L hip Pain Descriptors / Indicators: Discomfort Pain Intervention(s): Limited activity within patient's tolerance, Monitored during session, Repositioned    Home Living Family/patient expects to be discharged to:: Assisted living  Home Equipment: Agricultural consultant (2 wheels);Wheelchair - manual      Prior Function Prior Level of Function : Needs assist             Mobility Comments: assist with getting in/out of bed, supv for w/c transfers with RW ADLs Comments: Assist for showers x2/wk, otherwise does sink baths,  assist for dressing, assist for toilet transfers     Extremity/Trunk Assessment   Upper Extremity Assessment Upper Extremity Assessment: Defer to OT evaluation    Lower Extremity Assessment Lower Extremity Assessment: Generalized weakness;LLE deficits/detail LLE Deficits / Details: deficits consistent with post op pain and weakness    Cervical / Trunk Assessment Cervical / Trunk Assessment: Kyphotic  Communication   Communication Communication: Impaired Factors Affecting Communication: Hearing impaired    Cognition Arousal: Alert Behavior During Therapy: WFL for tasks assessed/performed   PT - Cognitive impairments: No apparent impairments                         Following commands: Intact       Cueing Cueing Techniques: Verbal cues     General Comments      Exercises     Assessment/Plan    PT Assessment Patient needs continued PT services  PT Problem List Decreased strength;Decreased balance;Decreased activity tolerance;Decreased mobility;Decreased knowledge of use of DME;Decreased safety awareness;Decreased knowledge of precautions;Pain       PT Treatment Interventions DME instruction;Functional mobility training;Therapeutic activities;Therapeutic exercise;Balance training;Neuromuscular re-education;Patient/family education    PT Goals (Current goals can be found in the Care Plan section)  Acute Rehab PT Goals Patient Stated Goal: to get stronger PT Goal Formulation: With patient Time For Goal Achievement: 06/14/24 Potential to Achieve Goals: Good    Frequency 7X/week     Co-evaluation               AM-PAC PT 6 Clicks Mobility  Outcome Measure Help needed turning from your back to your side while in a flat bed without using bedrails?: A Little Help needed moving from lying on your back to sitting on the side of a flat bed without using bedrails?: A Little Help needed moving to and from a bed to a chair (including a wheelchair)?: A  Little Help needed standing up from a chair using your arms (e.g., wheelchair or bedside chair)?: A Little Help needed to walk in hospital room?: Total Help needed climbing 3-5 steps with a railing? : Total 6 Click Score: 14    End of Session   Activity Tolerance: Patient tolerated treatment well Patient left: in chair;with call bell/phone within reach Nurse Communication: Mobility status PT Visit Diagnosis: Unsteadiness on feet (R26.81);Muscle weakness (generalized) (M62.81);Other abnormalities of gait and mobility (R26.89)    Time: 9090-9063 PT Time Calculation (min) (ACUTE ONLY): 27 min   Charges:   PT Evaluation $PT Eval Moderate Complexity: 1 Mod   PT General Charges $$ ACUTE PT VISIT: 1 Visit         Maryanne Finder, PT, DPT Physical Therapist - [facility]  [facility]   Jasminemarie Sherrard A Laquinton Bihm 05/31/2024, 9:46 AM

## 2024-05-31 NOTE — Evaluation (Signed)
 Occupational Therapy Evaluation Patient Details Name: Yvonne Singleton MRN: [id number] DOB: 1935/06/09 Today's Date: 05/31/2024   History of Present Illness   89 y/o female s/p L THA revision on 05/30/24. PMH: HTN, breast cancer, glaucoma, Parkinson's, RA, hx L THA with recurrent dislocations     Clinical Impressions Pt seen for OT evaluation this date, POD#1 from above surgery. Pt lives at in ALF requiring assist for ADL transfers to/from w/c, bed mobility, ADL, and IADL. Pt primarily w/c level and able to self propel. Pt is eager to return to PLOF with less pain and improved safety and independence. Pt currently requires minimal assist for ADL transfers to/from EOB and elevated BSC with cues for hand/feet placement, MAX A for standing pericare (unable to maintain static standing balance without BUE support on RW) and LB ADL while in seated position due to pain, weakness, decr balance, and limited AROM of L hip. Pt able to recall 0/3 posterior total hip precautions at start of session and unable to verbalize how to implement during ADL and mobility. Pt instructed in posterior total hip precautions and how to implement, self care skills. At end of session, pt able to recall 3/3 posterior total hip precautions. Pt would benefit from additional instruction in self care skills and techniques to help maintain precautions with or without assistive devices to support recall and carryover prior to discharge.       If plan is discharge home, recommend the following:   A little help with walking and/or transfers;A lot of help with bathing/dressing/bathroom;Assistance with cooking/housework;Assist for transportation;Help with stairs or ramp for entrance     Functional Status Assessment   Patient has had a recent decline in their functional status and demonstrates the ability to make significant improvements in function in a reasonable and predictable amount of time.     Equipment  Recommendations   Other (comment) (defer to next venue)     Recommendations for Other Services         Precautions/Restrictions   Precautions Precautions: Fall;Posterior Hip Precaution Booklet Issued: Yes (comment) Recall of Precautions/Restrictions: Intact Precaution/Restrictions Comments: 0/3 at start of session, able to recall 3/3 at end of session; cues during session to maintain Restrictions Weight Bearing Restrictions Per Provider Order: Yes LLE Weight Bearing Per Provider Order: Weight bearing as tolerated     Mobility Bed Mobility Overal bed mobility: Needs Assistance Bed Mobility: Supine to Sit     Supine to sit: Contact guard, HOB elevated, Used rails     General bed mobility comments: increased time and use of bed rails to complete. CGA for safety and support of L LE intermittently    Transfers Overall transfer level: Needs assistance Equipment used: Rolling walker (2 wheels) Transfers: Sit to/from Stand, Bed to chair/wheelchair/BSC Sit to Stand: Min assist     Step pivot transfers: Min assist, +2 safety/equipment     General transfer comment: Cues for hand placement for standing. Able to take steps to [facility] and void successfully. Transferred from [facility] > recliner with RW      Balance Overall balance assessment: Needs assistance Sitting-balance support: No upper extremity supported, Feet supported Sitting balance-Leahy Scale: Good     Standing balance support: Bilateral upper extremity supported, Reliant on assistive device for balance, During functional activity Standing balance-Leahy Scale: Fair                             ADL either performed or assessed  with clinical judgement   ADL Overall ADL's : Needs assistance/impaired                 Upper Body Dressing : Sitting;Minimal assistance Upper Body Dressing Details (indicate cue type and reason): gown Lower Body Dressing: Sit to/from stand;Maximal assistance   Toilet  Transfer: Minimal assistance;Rolling walker (2 wheels);BSC/3in1;Cueing for safety;Cueing for sequencing;Adhering to hip precautions;+2 for safety/equipment   Toileting- Clothing Manipulation and Hygiene: Maximal assistance;Sit to/from stand Toileting - Clothing Manipulation Details (indicate cue type and reason): slight LOB when attempting to complete pericare in standing, requiring MAX A to complete so pt could keep BUE support on the RW             Vision         Perception         Praxis         Pertinent Vitals/Pain Pain Assessment Pain Assessment: 0-10 Pain Score: 5  Pain Location: L hip Pain Descriptors / Indicators: Discomfort Pain Intervention(s): Limited activity within patient's tolerance, Monitored during session, Repositioned     Extremity/Trunk Assessment Upper Extremity Assessment Upper Extremity Assessment: Generalized weakness   Lower Extremity Assessment Lower Extremity Assessment: Generalized weakness;Defer to PT evaluation;LLE deficits/detail LLE Deficits / Details: deficits consistent with post op pain and weakness   Cervical / Trunk Assessment Cervical / Trunk Assessment: Kyphotic   Communication Communication Communication: Impaired Factors Affecting Communication: Hearing impaired   Cognition Arousal: Alert Behavior During Therapy: WFL for tasks assessed/performed Cognition: No apparent impairments                               Following commands: Intact       Cueing  General Comments   Cueing Techniques: Verbal cues      Exercises Other Exercises Other Exercises: Pt educated in posterior THPs and how to maintain during ADL/mobility.   Shoulder Instructions      Home Living Family/patient expects to be discharged to:: Assisted living                             Home Equipment: Rolling Walker (2 wheels);Wheelchair - manual          Prior Functioning/Environment Prior Level of Function : Needs  assist             Mobility Comments: assist with getting in/out of bed, supv for w/c transfers with RW ADLs Comments: Assist for showers x2/wk, otherwise does sink baths, assist for dressing, assist for toilet transfers    OT Problem List: Decreased strength;Pain;Decreased range of motion;Decreased activity tolerance;Impaired balance (sitting and/or standing);Decreased knowledge of use of DME or AE   OT Treatment/Interventions: Self-care/ADL training;Therapeutic exercise;Therapeutic activities;DME and/or AE instruction;Patient/family education;Balance training      OT Goals(Current goals can be found in the care plan section)   Acute Rehab OT Goals Patient Stated Goal: get stronger then go back to ALF OT Goal Formulation: With patient Time For Goal Achievement: 06/14/24 Potential to Achieve Goals: Good ADL Goals Pt Will Transfer to Toilet: with supervision;stand pivot transfer;regular height toilet;bedside commode Pt Will Perform Toileting - Clothing Manipulation and hygiene: sitting/lateral leans;sit to/from stand;with supervision Additional ADL Goal #1: Pt will adhere to 3/3 posterior THPs during ADL/mobility without cues, 5/5 opportunities.   OT Frequency:  Min 2X/week    Co-evaluation PT/OT/SLP Co-Evaluation/Treatment: Yes Reason for Co-Treatment: To address functional/ADL transfers;For patient/therapist safety  PT goals addressed during session: Mobility/safety with mobility;Balance;Proper use of DME OT goals addressed during session: ADL's and self-care;Proper use of Adaptive equipment and DME      AM-PAC OT 6 Clicks Daily Activity     Outcome Measure Help from another person eating meals?: None Help from another person taking care of personal grooming?: None Help from another person toileting, which includes using toliet, bedpan, or urinal?: A Lot Help from another person bathing (including washing, rinsing, drying)?: A Lot Help from another person to put on and  taking off regular upper body clothing?: A Little Help from another person to put on and taking off regular lower body clothing?: A Lot 6 Click Score: 17   End of Session Equipment Utilized During Treatment: Rolling walker (2 wheels) Nurse Communication: Mobility status  Activity Tolerance: Patient tolerated treatment well Patient left: in chair;with call bell/phone within reach  OT Visit Diagnosis: Other abnormalities of gait and mobility (R26.89);Muscle weakness (generalized) (M62.81);Pain Pain - Right/Left: Left Pain - part of body: Hip                Time: 9089-9062 OT Time Calculation (min): 27 min Charges:  OT General Charges $OT Visit: 1 Visit OT Evaluation $OT Eval Low Complexity: 1 Low OT Treatments $Self Care/Home Management : 8-22 mins  Warren SAUNDERS., MPH, MS, OTR/L ascom [phone] 05/31/24, 10:09 AM

## 2024-06-01 DIAGNOSIS — Z96641 Presence of right artificial hip joint: Secondary | ICD-10-CM | POA: Diagnosis present

## 2024-06-01 MED ORDER — METOCLOPRAMIDE HCL 10 MG PO TABS
ORAL_TABLET | ORAL | Status: AC
  Filled 2024-06-01: qty 1

## 2024-06-01 NOTE — Discharge Summary (Signed)
 Physician Discharge Summary  Subjective: 3 Days Post-Op Procedure(s) (LRB): REVISION, ARTHROPLASTY, HIP (Left) Patient reports pain as mild.   Patient is well, and has had no acute complaints or problems Denies any CP, SOB, fevers or chills.Still reports some spitting up of water last night, but overall improvement of nausea We will continue therapy today.  Patient has not had a BM yet, encouraged to sit on commode with PT this AM Patient is ready to go to SNF  Physician Discharge Summary  Patient ID: Yvonne Singleton MRN: [id number] DOB/AGE: [birth year] 89 y.o.  Admit date: 05/30/2024 Discharge date: 06/02/2024  Admission Diagnoses:  Discharge Diagnoses:  Principal Problem:   S/P revision of total hip Active Problems:   S/p revision of right total hip   Discharged Condition: good  Hospital Course: Patient presented to the hospital on 05/30/2024 for an elective left total hip revision to constrained liner. Patient was given 1g of TXA and 2g of Ancef  prior to the procedure. she tolerated the procedure well without any complications. See procedural note below for details. Postoperatively, the patient did well. she was able to work well with PT, but would benefit from SNF per reccomendations. JP drain was removed without any difficulty and was intact on PO day 1. she was able to void her bladder without any difficulty, but has had some difficulty stooling and had some nausea PO day 2. Able to void bowels PO day 3. Physical exam was unremarkable. She reports improved N/V, denies any SOB, CP, fevers or chills today. Vital signs are stable. Patient is stable to discharge to SNF.   PROCEDURE:  Right hip revision arthroplasty to a constrained liner   SURGEON:  Lynwood SHAUNNA Hue, Jr. M.D.   ASSISTANT: Sidra Koyanagi, PA-C (present and scrubbed throughout the case, critical for assistance with exposure, retraction, instrumentation, and closure)   ANESTHESIA: spinal   ESTIMATED BLOOD LOSS:  50 mL   FLUIDS REPLACED: 1000 mL of crystalloid   DRAINS: 2 medium Hemovac drains   IMPLANTS UTILIZED: DePuy 56 mm Duraloc dynamic locking ring, constrained polyethylene insert (32 mm ID, 56 mm OD), and a 32 mm CoCr +9 mm hip ball  Treatments: Zofran   Discharge Exam: Blood pressure (!) 126/52, pulse 63, temperature (!) 97.4 F (36.3 C), temperature source Oral, resp. rate 17, height 5' 3 (1.6 m), weight 82.6 kg, SpO2 93%.   Disposition: To SNF  Allergies as of 06/02/2024       Reactions   Streptomycin Hives   Digoxin Other (See Comments)   Other reaction(s): Unknown Note: pt had lethargy, felt bad Note: pt had lethargy, felt bad Other reaction(s): Unknown Note: pt had lethargy, felt bad Other reaction(s): Unknown Note: pt had lethargy, felt bad Note: pt had lethargy, felt bad   Trospium Nausea And Vomiting   Citric Acid Other (See Comments)   Unknown reaction per patient   Iodinated Contrast Media Hives        Medication List     TAKE these medications    acetaminophen  500 MG tablet Commonly known as: TYLENOL  Take 500 mg by mouth every 8 (eight) hours as needed (headache (Max 4000 mg/day)).   Alpha-Lipoic Acid 600 MG Tabs Take 600 mg by mouth in the morning.   aspirin  81 MG chewable tablet Chew 1 tablet (81 mg total) by mouth 2 (two) times daily.   benzonatate  100 MG capsule Commonly known as: Tessalon  Perles Take 1 capsule (100 mg total) by mouth 3 (three) times daily as needed  for cough.   Calcium  Carbonate-Vitamin D  600-200 MG-UNIT Tabs Take 1 tablet by mouth 2 (two) times daily.   carbidopa -levodopa  50-200 MG tablet Commonly known as: SINEMET  CR Take 1 tablet by mouth 3 (three) times daily.   celecoxib  200 MG capsule Commonly known as: CELEBREX  Take 1 capsule (200 mg total) by mouth 2 (two) times daily.   chlorhexidine  4 % external liquid Commonly known as: HIBICLENS  Apply 15 mLs (1 Application total) topically as directed for 30 doses. Use as  directed daily for 5 days every other week for 6 weeks.   mupirocin  ointment 2 % Commonly known as: BACTROBAN  Place 1 Application into the nose 2 (two) times daily for 60 doses. Use as directed 2 times daily for 5 days every other week for 6 weeks.   ondansetron  4 MG tablet Commonly known as: ZOFRAN  Take 1 tablet (4 mg total) by mouth every 6 (six) hours as needed for nausea.   oxyCODONE  5 MG immediate release tablet Commonly known as: Oxy IR/ROXICODONE  Take 1 tablet (5 mg total) by mouth every 4 (four) hours as needed for moderate pain (pain score 4-6) (pain score 4-6).   pantoprazole  40 MG tablet Commonly known as: PROTONIX  Take 1 tablet (40 mg total) by mouth 2 (two) times daily before a meal.   sertraline  100 MG tablet Commonly known as: ZOLOFT  Take 100 mg by mouth in the morning.   traMADol  50 MG tablet Commonly known as: ULTRAM  Take 1-2 tablets (50-100 mg total) by mouth every 4 (four) hours as needed for moderate pain (pain score 4-6).   trimethoprim  100 MG tablet Commonly known as: TRIMPEX  Take 1 tablet (100 mg total) by mouth daily.               Durable Medical Equipment  (From admission, onward)           Start     Ordered   05/30/24 1903  DME Walker rolling  Once       Question:  Patient needs a walker to treat with the following condition  Answer:  S/P total hip arthroplasty   05/30/24 1902   05/30/24 1903  DME Bedside commode  Once       Comments: Patient is not able to walk the distance required to go the bathroom, or he/she is unable to safely negotiate stairs required to access the bathroom.  A 3in1 BSC will alleviate this problem  Question:  Patient needs a bedside commode to treat with the following condition  Answer:  S/P total hip arthroplasty   05/30/24 1902            Contact information for follow-up providers     Hooten, Lynwood SQUIBB, MD Follow up on 07/17/2024.   Specialty: Orthopedic Surgery Why: at 3:00pm Contact information: [address]  [facility] [address] [facility] [place] [address] [phone]              Contact information for after-discharge care     Destination     [facility] .   Service: Skilled Nursing Contact information: [address] [place]  [address] [phone]                     Signed: Sidra Koyanagi 06/02/2024, 8:49 AM   Objective: Vital signs in last 24 hours: Temp:  [97.4 F (36.3 C)-98.9 F (37.2 C)] 97.4 F (36.3 C) (08/16 0753) Pulse Rate:  [61-86] 63 (08/16 0753) Resp:  [  16-20] 17 (08/16 0753) BP: (103-127)/(43-60) 126/52 (08/16 0753) SpO2:  [92 %-96 %] 93 % (08/16 0753)  Intake/Output from previous day:  Intake/Output Summary (Last 24 hours) at 06/02/2024 0849 Last data filed at 06/01/2024 1900 Gross per 24 hour  Intake 240 ml  Output --  Net 240 ml    Intake/Output this shift: No intake/output data recorded.  Labs: No results for input(s): HGB in the last 72 hours. No results for input(s): WBC, RBC, HCT, PLT in the last 72 hours. No results for input(s): NA, K, CL, CO2, BUN, CREATININE, GLUCOSE, CALCIUM  in the last 72 hours. No results for input(s): LABPT, INR in the last 72 hours.  EXAM: General - Patient is Alert, Appropriate, and Oriented Extremity - Neurologically intact ABD soft Neurovascular intact Sensation intact distally Intact pulses distally Dorsiflexion/Plantar flexion intact No cellulitis present Compartment soft Dressing - dressing C/D/I and no drainage Motor Function - intact, moving foot and toes well on exam.  Assessment/Plan: 3 Days Post-Op Procedure(s) (LRB): REVISION, ARTHROPLASTY, HIP (Left) Procedure(s) (LRB): REVISION, ARTHROPLASTY, HIP (Left) Past Medical History:  Diagnosis Date   Acute metabolic encephalopathy 05/12/2022   Aortic atherosclerosis (HCC)    Back pain    Breast cancer, right (HCC) 2007   a.) Tx'd with  partial mastectomy (lumpectomy) + adjuvant XRT   Cerebral microvascular disease    CKD (chronic kidney disease) stage 3, GFR 30-59 ml/min (HCC)    Collagen vascular disease (HCC)    Depression    Diastolic dysfunction    Fatty liver    GERD (gastroesophageal reflux disease)    Glaucoma    Gout    Hard of hearing    Headache 05/16/2022   History of bilateral hip replacements    History of hiatal hernia    Hyperlipidemia    Hypertension    Lives in assisted living facility    [facility]   Macular degeneration    Parkinson's disease [facility])    Peripheral neuropathy    RA (rheumatoid arthritis) (HCC)    Recurrent dislocation of hip    Spinal stenosis of lumbar region    Ventral hernia    Vitamin B 12 deficiency    Principal Problem:   S/P revision of total hip Active Problems:   S/p revision of right total hip  Estimated body mass index is 32.24 kg/m as calculated from the following:   Height as of this encounter: 5' 3 (1.6 m).   Weight as of this encounter: 82.6 kg.  Patient will continue to work with physical therapy to pass postoperative PT protocols, ROM and strengthening   Hip Preacutions   Discussed with the patient continuing to utilize ice over the bandage   Patient will wear TED hose bilaterally to help prevent DVT and clot formation   Discussed the Aquacel bandage.  This bandage will stay in place 7 days postoperatively.  Can be replaced with honeycomb bandages that will be sent home with the patient   Encouraged patient to use Zofran , order already in place. Able to have BM   Discussed sending the patient home with tramadol  and oxycodone  for as needed pain management.  Patient will also be sent home with Celebrex  to help with swelling and inflammation.  Patient will take an 81 mg aspirin  twice daily for DVT prophylaxis   Weight-Bearing as tolerated to left leg   Patient will follow-up with [facility] clinic orthopedics in 6 weeks for re-imaging and  reevaluation   DC planning for SNF, TOC  consulted, found placement at [facility]  Diet - Regular diet Follow up - in 6 weeks Activity - WBAT Disposition - Skilled nursing facility Condition Upon Discharge - Good DVT Prophylaxis - Aspirin  and TED hose  Fonda CHARLENA Koyanagi, PA-C Orthopaedic Surgery 06/02/2024, 8:49 AM

## 2024-06-01 NOTE — Progress Notes (Signed)
 Physical Therapy Treatment Patient Details Name: Yvonne Singleton MRN: [id number] DOB: July 20, 1935 Today's Date: 06/01/2024   History of Present Illness 89 y/o female s/p L THA revision on 05/30/24. PMH: HTN, breast cancer, glaucoma, Parkinson's, RA, hx L THA with recurrent dislocations    PT Comments  Pt A&Ox4 and agreeable to participate in PT, currently POD1 L THA revision. Pt denied pain, cited nausea throughout session. Bed mobility completed with minA to scoot hips to EOB, HOB elevated and bed rail assist required. Pt able to step-pivot transfer from bed > BSC and from [facility] > recliner with minA d/t posterior lean and for RW stabilization- pt demonstrated short, shuffled steps with minimal foot clearance. Pt able to complete pericare in standing with minA d/t posterior lean, required maxA to manage brief pre/post voiding. The patient would benefit from further skilled PT intervention to continue to progress towards goals. Recommendation remains appropriate.     If plan is discharge home, recommend the following: A little help with walking and/or transfers;A little help with bathing/dressing/bathroom;Assistance with cooking/housework;Assist for transportation   Can travel by private vehicle     Yes  Equipment Recommendations  Other (comment) (TBD)    Recommendations for Other Services       Precautions / Restrictions Precautions Precautions: Fall;Posterior Hip Precaution Booklet Issued: No Recall of Precautions/Restrictions: Intact Precaution/Restrictions Comments: pt able to recall 3/3 posterior hip precautions at start of session without cueing Restrictions Weight Bearing Restrictions Per Provider Order: Yes LLE Weight Bearing Per Provider Order: Weight bearing as tolerated     Mobility  Bed Mobility Overal bed mobility: Needs Assistance Bed Mobility: Supine to Sit     Supine to sit: Min assist, HOB elevated, Used rails     General bed mobility comments: able to  come to sitting EOB with minA to scoot hips forward, HOB elevated, bed rail assist; increased time and effort    Transfers Overall transfer level: Needs assistance Equipment used: Rolling walker (2 wheels) Transfers: Sit to/from Stand, Bed to chair/wheelchair/BSC Sit to Stand: Min assist   Step pivot transfers: Min assist, +2 safety/equipment       General transfer comment: Step pivot transfers bed > BSC, BSC > recliner. Each required minA for RW stabilization; increased time and effort- pt able to shuffle, minimal foot clearance bilaterally    Ambulation/Gait                   Stairs             Wheelchair Mobility     Tilt Bed    Modified Rankin (Stroke Patients Only)       Balance Overall balance assessment: Needs assistance Sitting-balance support: Feet supported Sitting balance-Leahy Scale: Good     Standing balance support: Bilateral upper extremity supported, Reliant on assistive device for balance Standing balance-Leahy Scale: Fair Standing balance comment: heavy UE use                            Communication Communication Communication: Impaired Factors Affecting Communication: Reduced clarity of speech (soft spoken)  Cognition Arousal: Alert Behavior During Therapy: WFL for tasks assessed/performed   PT - Cognitive impairments: No apparent impairments                       PT - Cognition Comments: A&Ox4 Following commands: Intact      Cueing Cueing Techniques: Verbal cues  Exercises General Exercises -  Lower Extremity Long Arc Quad: AROM, Both, 10 reps, Seated Toe Raises: AROM, Both, 10 reps, Seated Heel Raises: AROM, Both, 10 reps, Seated Other Exercises Other Exercises: pt able to complete pericare after using BSC in standing with minA d/t posterior lean, maxA to manage brief pre/post toileting    General Comments        Pertinent Vitals/Pain Pain Assessment Pain Assessment: No/denies pain    Home  Living                          Prior Function            PT Goals (current goals can now be found in the care plan section) Progress towards PT goals: Progressing toward goals    Frequency    7X/week      PT Plan      Co-evaluation              AM-PAC PT 6 Clicks Mobility   Outcome Measure  Help needed turning from your back to your side while in a flat bed without using bedrails?: A Little Help needed moving from lying on your back to sitting on the side of a flat bed without using bedrails?: A Little Help needed moving to and from a bed to a chair (including a wheelchair)?: A Little Help needed standing up from a chair using your arms (e.g., wheelchair or bedside chair)?: A Little Help needed to walk in hospital room?: Total Help needed climbing 3-5 steps with a railing? : Total 6 Click Score: 14    End of Session Equipment Utilized During Treatment: Gait belt Activity Tolerance: Patient tolerated treatment well Patient left: in chair;with call bell/phone within reach Nurse Communication: Mobility status PT Visit Diagnosis: Unsteadiness on feet (R26.81);Muscle weakness (generalized) (M62.81);Other abnormalities of gait and mobility (R26.89)     Time: 9094-9071 PT Time Calculation (min) (ACUTE ONLY): 23 min  Charges:    $Therapeutic Activity: 23-37 mins PT General Charges $$ ACUTE PT VISIT: 1 Visit                     Khrystal Jeanmarie, SPT

## 2024-06-01 NOTE — Progress Notes (Signed)
 Subjective: 2 Days Post-Op Procedure(s) (LRB): REVISION, ARTHROPLASTY, HIP (Left) Patient reports pain as mild.   Patient seen in rounds with Dr. Mardee. Patient is well, and has had no acute complaints or problems Denies any CP, SOB, fevers or chills. She does admit to having some nausea and vomiting last night. Would like something for the nausea today We will continue therapy today.  Patient has not had a BM yet Plan is to go Home vs SNF after hospital stay.  Objective: Vital signs in last 24 hours: Temp:  [97.3 F (36.3 C)-98.1 F (36.7 C)] 97.3 F (36.3 C) (08/15 0034) Pulse Rate:  [51-70] 70 (08/15 0034) Resp:  [15-18] 18 (08/15 0034) BP: (106-135)/(45-59) 135/59 (08/15 0034) SpO2:  [93 %-97 %] 94 % (08/15 0034)  Intake/Output from previous day:  Intake/Output Summary (Last 24 hours) at 06/01/2024 0746 Last data filed at 05/31/2024 1559 Gross per 24 hour  Intake 1211.67 ml  Output 300 ml  Net 911.67 ml    Intake/Output this shift: No intake/output data recorded.  Labs: No results for input(s): HGB in the last 72 hours. No results for input(s): WBC, RBC, HCT, PLT in the last 72 hours. No results for input(s): NA, K, CL, CO2, BUN, CREATININE, GLUCOSE, CALCIUM  in the last 72 hours. No results for input(s): LABPT, INR in the last 72 hours.  EXAM General - Patient is Alert, Appropriate, and Oriented Extremity - Neurologically intact ABD soft Neurovascular intact Sensation intact distally Intact pulses distally Dorsiflexion/Plantar flexion intact No cellulitis present Compartment soft Dressing - dressing C/D/I and no drainage Motor Function - intact, moving foot and toes well on exam.  Past Medical History:  Diagnosis Date   Acute metabolic encephalopathy 05/12/2022   Aortic atherosclerosis (HCC)    Back pain    Breast cancer, right (HCC) 2007   a.) Tx'd with partial mastectomy (lumpectomy) + adjuvant XRT   Cerebral microvascular  disease    CKD (chronic kidney disease) stage 3, GFR 30-59 ml/min (HCC)    Collagen vascular disease (HCC)    Depression    Diastolic dysfunction    Fatty liver    GERD (gastroesophageal reflux disease)    Glaucoma    Gout    Hard of hearing    Headache 05/16/2022   History of bilateral hip replacements    History of hiatal hernia    Hyperlipidemia    Hypertension    Lives in assisted living facility    [facility]   Macular degeneration    Parkinson's disease (HCC)    Peripheral neuropathy    RA (rheumatoid arthritis) (HCC)    Recurrent dislocation of hip    Spinal stenosis of lumbar region    Ventral hernia    Vitamin B 12 deficiency     Assessment/Plan: 2 Days Post-Op Procedure(s) (LRB): REVISION, ARTHROPLASTY, HIP (Left) Principal Problem:   S/P revision of total hip  Estimated body mass index is 32.24 kg/m as calculated from the following:   Height as of this encounter: 5' 3 (1.6 m).   Weight as of this encounter: 82.6 kg. Advance diet Up with therapy  Patient will continue to work with physical therapy to pass postoperative PT protocols, ROM and strengthening  Hip Preacutions  Discussed with the patient continuing to utilize ice over the bandage  Patient will wear TED hose bilaterally to help prevent DVT and clot formation  Discussed the Aquacel bandage.  This bandage will stay in place 7 days postoperatively.  Can be replaced  with honeycomb bandages that will be sent home with the patient  Encouraged patient to use Zofran , order already in place. Continue to monitor BM  Discussed sending the patient home with tramadol  and oxycodone  for as needed pain management.  Patient will also be sent home with Celebrex  to help with swelling and inflammation.  Patient will take an 81 mg aspirin  twice daily for DVT prophylaxis  Weight-Bearing as tolerated to left leg  Patient will follow-up with [facility] clinic orthopedics in 6 weeks for re-imaging and  reevaluation  DC planning for [facility] vs SNF, family has preference for SNF, TOC consulted   Fonda Koyanagi, PA-C [facility] 06/01/2024, 7:46 AM

## 2024-06-01 NOTE — NC FL2 (Signed)
 [place]  MEDICAID [id number] LEVEL OF CARE FORM     IDENTIFICATION  Patient Name: Yvonne Singleton Birthdate: [birth year] Sex: female Admission Date (Current Location): 05/30/2024  [facility] [place] Number:  Chiropodist and Address:  [facility], [address] [place], [place] [address]      Provider Number: [id number]  Attending Physician Name and Address:  Mardee Lynwood SQUIBB, MD  Relative Name and Phone Number:  Landry Funk [phone]    Current Level of Care: Hospital Recommended Level of Care: Skilled Nursing Facility Prior Approval Number:    Date Approved/Denied: 06/27/09 PASRR Number: [id number]  Discharge Plan: SNF    Current Diagnoses: Patient Active Problem List   Diagnosis Date Noted   S/p revision of right total hip 06/01/2024   S/P revision of total hip 05/30/2024   Anterior dislocation of left hip, initial encounter (HCC) 03/07/2024   Parkinson's disease (HCC) 03/07/2024   Peripheral neuropathy 03/07/2024   Essential hypertension 03/07/2024   Hip dislocation, left (HCC) 05/16/2023   Chronic kidney disease, stage 3a (HCC) 05/16/2023   Hyperkalemia 05/16/2023   Pressure injury of left buttock, stage 1 02/17/2023   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) 01/17/2023   Acute metabolic encephalopathy 06/29/2022   Essential hypertension, benign 06/29/2022   Vitamin B12 deficiency 05/14/2022   Recurrent dislocation, right hip 04/06/2022   Gout 04/06/2022   Depression 04/06/2022   Paraesophageal hernia    Gastritis without bleeding    Combined rheumatic disorders of mitral, aortic and tricuspid valves 10/18/2021   Dorsalgia, unspecified 10/18/2021   Hypertensive heart disease with heart failure (HCC) 10/18/2021   Personal history of urinary (tract) infections 10/18/2021   Unspecified glaucoma 10/18/2021   Unspecified urinary incontinence 10/18/2021   Ventral hernia without obstruction or gangrene  10/18/2021   Diastolic dysfunction 10/06/2021   Regional enteritis of small bowel (HCC) 10/24/2018   Clostridium difficile infection 10/24/2018   Macular degeneration (senile) of retina 07/24/2018   Lower extremity edema 06/27/2018   Hiatal hernia 06/03/2017   Hyperlipidemia 05/30/2017   Hypertension 05/30/2017   GERD (gastroesophageal reflux disease) 05/30/2017   Kidney stones 05/30/2017   Rupture of tendon of hand 05/30/2017   Parkinson disease (HCC) 10/29/2016   Dementia due to Parkinson's disease without behavioral disturbance (HCC) 10/29/2016   History of bilateral hip replacements 05/28/2015   Spinal stenosis, lumbar region, with neurogenic claudication 05/28/2015   DDD (degenerative disc disease), lumbar 03/13/2015   Lumbar post-laminectomy syndrome 03/13/2015   Degenerative cervical disc 03/13/2015   Status post bilateral total hip replacement 03/13/2015   Sacroiliac joint dysfunction 03/13/2015   History of surgery on upper extremity 03/13/2015   DJD of shoulder 03/13/2015   Rheumatoid arthritis (HCC) 02/17/2015   Valvular heart disease 04/03/2012   Non-alcoholic fatty liver disease 09/27/2011   Inflammatory polyarthropathy (HCC) 07/07/2011   Personal history of breast cancer 10/18/2005    Orientation RESPIRATION BLADDER Height & Weight     Self, Time, Situation, Place  Normal Continent Weight: 182 lb (82.6 kg) Height:  5' 3 (160 cm)  BEHAVIORAL SYMPTOMS/MOOD NEUROLOGICAL BOWEL NUTRITION STATUS      Continent Diet (Diet regular Room service appropriate? Yes; Fluid consistency: Thin: General starting at 08/13 1644)  AMBULATORY STATUS COMMUNICATION OF NEEDS Skin   Extensive Assist Verbally Surgical wounds                       Personal Care Assistance Level of Assistance  Bathing, Feeding, Dressing Bathing Assistance: Limited assistance Feeding assistance: Independent Dressing Assistance: Limited assistance     Functional Limitations Info  Sight, Hearing,  Speech Sight Info: Adequate Hearing Info: Adequate Speech Info: Adequate    SPECIAL CARE FACTORS FREQUENCY  PT (By licensed PT), OT (By licensed OT)     PT Frequency: 7x OT Frequency: 2x            Contractures Contractures Info: Not present    Additional Factors Info  Allergies, Code Status Code Status Info: FULL Allergies Info: Digoxin; Streptomycin           Current Medications (06/01/2024):  This is the current hospital active medication list Current Facility-Administered Medications  Medication Dose Route Frequency Provider Last Rate Last Admin   0.9 %  sodium chloride  infusion   Intravenous Continuous Hooten, Lynwood SQUIBB, MD 100 mL/hr at 05/31/24 0552 Infusion Verify at 05/31/24 9447   acetaminophen  (TYLENOL ) tablet 325-650 mg  325-650 mg Oral Q6H PRN Mardee Lynwood SQUIBB, MD   650 mg at 05/31/24 2302   alum & mag hydroxide-simeth (MAALOX/MYLANTA) 200-200-20 MG/5ML suspension 30 mL  30 mL Oral Q4H PRN Hooten, James P, MD       aspirin  chewable tablet 81 mg  81 mg Oral BID Hooten, James P, MD   81 mg at 05/31/24 2055   bisacodyl  (DULCOLAX) suppository 10 mg  10 mg Rectal Daily PRN Hooten, Lynwood SQUIBB, MD       carbidopa -levodopa  (SINEMET  CR) 50-200 MG per tablet controlled release 1 tablet  1 tablet Oral TID Hooten, James P, MD   1 tablet at 05/31/24 2055   celecoxib  (CELEBREX ) capsule 200 mg  200 mg Oral BID Hooten, James P, MD   200 mg at 05/31/24 2055   diphenhydrAMINE  (BENADRYL ) 12.5 MG/5ML elixir 12.5-25 mg  12.5-25 mg Oral Q4H PRN Hooten, Lynwood SQUIBB, MD       ferrous sulfate  tablet 325 mg  325 mg Oral BID WC Hooten, James P, MD   325 mg at 05/31/24 1604   HYDROmorphone  (DILAUDID ) injection 0.5-1 mg  0.5-1 mg Intravenous Q4H PRN Hooten, Lynwood SQUIBB, MD       magnesium  hydroxide (MILK OF MAGNESIA) suspension 30 mL  30 mL Oral Daily Hooten, Lynwood SQUIBB, MD       menthol -cetylpyridinium (CEPACOL) lozenge 3 mg  1 lozenge Oral PRN Hooten, Lynwood SQUIBB, MD       Or   phenol (CHLORASEPTIC) mouth  spray 1 spray  1 spray Mouth/Throat PRN Hooten, Lynwood SQUIBB, MD       metoCLOPramide  (REGLAN ) tablet 10 mg  10 mg Oral TID AC & HS Hooten, James P, MD   10 mg at 06/01/24 0830   ondansetron  (ZOFRAN ) tablet 4 mg  4 mg Oral Q6H PRN Hooten, James P, MD   4 mg at 05/31/24 2055   Or   ondansetron  (ZOFRAN ) injection 4 mg  4 mg Intravenous Q6H PRN Mardee Lynwood SQUIBB, MD   4 mg at 06/01/24 0818   oxyCODONE  (Oxy IR/ROXICODONE ) immediate release tablet 10 mg  10 mg Oral Q4H PRN Hooten, James P, MD   10 mg at 05/30/24 2117   oxyCODONE  (Oxy IR/ROXICODONE ) immediate release tablet 5 mg  5 mg Oral Q4H PRN Hooten, James P, MD   5 mg at 05/31/24 0422   pantoprazole  (PROTONIX ) EC tablet 40 mg  40 mg Oral BID Hooten, James P, MD   40 mg at 05/31/24 2055   senna-docusate (Senokot-S) tablet 1 tablet  1 tablet Oral BID Hooten, James P, MD   1 tablet at 05/31/24 2055   sertraline  (ZOLOFT ) tablet 100 mg  100 mg Oral Daily Hooten, James P, MD   100 mg at 05/31/24 9056   sodium phosphate  (FLEET) enema 1 enema  1 enema Rectal Once PRN Hooten, Lynwood SQUIBB, MD       traMADol  (ULTRAM ) tablet 50-100 mg  50-100 mg Oral Q4H PRN Hooten, James P, MD   50 mg at 05/31/24 2055   trimethoprim  (TRIMPEX ) tablet 100 mg  100 mg Oral Daily Hooten, James P, MD   100 mg at 05/31/24 9056     Discharge Medications: Please see discharge summary for a list of discharge medications.  Relevant Imaging Results:  Relevant Lab Results:   Additional Information [id number]  [place] L Margarito Dehaas, LCSW

## 2024-06-01 NOTE — Addendum Note (Signed)
 Addendum  created 06/01/24 0721 by Gillermo Spruce I, CRNA   Clinical Note Signed

## 2024-06-01 NOTE — Progress Notes (Signed)
 Subjective: 3 Days Post-Op Procedure(s) (LRB): REVISION, ARTHROPLASTY, HIP (Left) Patient reports pain as mild.   Patient seen in rounds with Dr. Mardee. Patient is well, and has had no acute complaints or problems Denies any CP, SOB, fevers or chills. Still reports some spitting up of water last night, but overall improvement of nausea We will continue therapy today.  Patient has not had a BM yet, encouraged to sit on commode with PT this AM Plan is to go to SNF after hospital stay.  Objective: Vital signs in last 24 hours: Temp:  [97.4 F (36.3 C)-98.9 F (37.2 C)] 97.4 F (36.3 C) (08/16 0753) Pulse Rate:  [61-86] 63 (08/16 0753) Resp:  [16-20] 17 (08/16 0753) BP: (103-127)/(43-60) 126/52 (08/16 0753) SpO2:  [92 %-96 %] 93 % (08/16 0753)  Intake/Output from previous day:  Intake/Output Summary (Last 24 hours) at 06/02/2024 0842 Last data filed at 06/01/2024 1900 Gross per 24 hour  Intake 240 ml  Output --  Net 240 ml    Intake/Output this shift: No intake/output data recorded.  Labs: No results for input(s): HGB in the last 72 hours. No results for input(s): WBC, RBC, HCT, PLT in the last 72 hours. No results for input(s): NA, K, CL, CO2, BUN, CREATININE, GLUCOSE, CALCIUM  in the last 72 hours. No results for input(s): LABPT, INR in the last 72 hours.  EXAM General - Patient is Alert, Appropriate, and Oriented Extremity - Neurologically intact ABD soft Neurovascular intact Sensation intact distally Intact pulses distally Dorsiflexion/Plantar flexion intact No cellulitis present Compartment soft Dressing - dressing C/D/I and no drainage Motor Function - intact, moving foot and toes well on exam.  Past Medical History:  Diagnosis Date   Acute metabolic encephalopathy 05/12/2022   Aortic atherosclerosis (HCC)    Back pain    Breast cancer, right (HCC) 2007   a.) Tx'd with partial mastectomy (lumpectomy) + adjuvant XRT   Cerebral  microvascular disease    CKD (chronic kidney disease) stage 3, GFR 30-59 ml/min (HCC)    Collagen vascular disease (HCC)    Depression    Diastolic dysfunction    Fatty liver    GERD (gastroesophageal reflux disease)    Glaucoma    Gout    Hard of hearing    Headache 05/16/2022   History of bilateral hip replacements    History of hiatal hernia    Hyperlipidemia    Hypertension    Lives in assisted living facility    [facility]   Macular degeneration    Parkinson's disease (HCC)    Peripheral neuropathy    RA (rheumatoid arthritis) (HCC)    Recurrent dislocation of hip    Spinal stenosis of lumbar region    Ventral hernia    Vitamin B 12 deficiency     Assessment/Plan: 3 Days Post-Op Procedure(s) (LRB): REVISION, ARTHROPLASTY, HIP (Left) Principal Problem:   S/P revision of total hip Active Problems:   S/p revision of right total hip  Estimated body mass index is 32.24 kg/m as calculated from the following:   Height as of this encounter: 5' 3 (1.6 m).   Weight as of this encounter: 82.6 kg. Advance diet Up with therapy  Patient will continue to work with physical therapy to pass postoperative PT protocols, ROM and strengthening  Hip Preacutions  Discussed with the patient continuing to utilize ice over the bandage  Patient will wear TED hose bilaterally to help prevent DVT and clot formation  Discussed the Aquacel bandage.  This bandage will stay in place 7 days postoperatively.  Can be replaced with honeycomb bandages that will be sent home with the patient  Encouraged patient to use Zofran , order already in place. Continue to monitor BM, on regimen for stool softening and laxative, fleet enema order in place, if no BM by 11 AM today may need to use enema, message sent to nursing  Discussed sending the patient home with tramadol  and oxycodone  for as needed pain management.  Patient will also be sent home with Celebrex  to help with swelling and inflammation.   Patient will take an 81 mg aspirin  twice daily for DVT prophylaxis  Weight-Bearing as tolerated to left leg  Patient will follow-up with [facility] clinic orthopedics in 6 weeks for re-imaging and reevaluation  DC planning for SNF, TOC consulted, found placement at [facility]   Fonda Koyanagi, PA-C [facility] 06/02/2024, 8:42 AM

## 2024-06-01 NOTE — Plan of Care (Signed)

## 2024-06-01 NOTE — TOC Progression Note (Addendum)
 Transition of Care [facility]) - Progression Note    Patient Details  Name: Yvonne Singleton MRN: [id number] Date of Birth: 1935/03/19  [facility]) CM/SW Contact  [place] L Syona Wroblewski, [place] Phone Number: 06/01/2024, 12:18 PM  Clinical Narrative:     CSW spoke with Landry Funk, daughter of patient, to discuss bed offers. CSW advised of bed offers and their star ratings.   Ms. Funk advised that she would discuss with patient and she would call CSW back. [facility] was declined by patient and daughter.    1:23pm: [facility]al[facility] Buys offered patient a bed and family accepted. However, patients status changed to inpatient and she needs three nights inpatient stay in order to transfer.   Patient status changed today and she is now inpatient. Patient can't discharge to SNF until Monday.    1:23: CSW met with patient and daughter at bedside. CSW explained that patient can not be transferred to [facility] today. CSW explained Medicare guidelines regarding three inpatient stays. Patient is not medically ready for discharge.   [facility] confirmed that they can take her tomorrow. Patient will be moved to a medical floor.               Expected Discharge Plan and Services                                               Social Drivers of Health (SDOH) Interventions SDOH Screenings   Food Insecurity: No Food Insecurity (05/30/2024)  Housing: Low Risk  (05/30/2024)  Transportation Needs: No Transportation Needs (05/30/2024)  Utilities: Not At Risk (05/30/2024)  Depression (PHQ2-9): Low Risk  (12/23/2023)  Financial Resource Strain: Low Risk  (04/17/2024)   Received from [facility]  Social Connections: Unknown (03/07/2024)  Tobacco Use: Low Risk  (05/30/2024)    Readmission Risk Interventions     No data to display

## 2024-06-01 NOTE — Anesthesia Postprocedure Evaluation (Signed)
 Anesthesia Post Note  Patient: Cathey Fredenburg  Procedure(s) Performed: REVISION, ARTHROPLASTY, HIP (Left: Hip)  Patient location during evaluation: Nursing Unit Anesthesia Type: Spinal Level of consciousness: oriented and awake and alert Pain management: pain level controlled Vital Signs Assessment: post-procedure vital signs reviewed and stable Respiratory status: spontaneous breathing and respiratory function stable Cardiovascular status: blood pressure returned to baseline and stable Postop Assessment: no headache, no backache, no apparent nausea or vomiting and patient able to bend at knees Anesthetic complications: no   There were no known notable events for this encounter.   Last Vitals:  Vitals:   05/31/24 2001 06/01/24 0034  BP: (!) 114/52 (!) 135/59  Pulse: (!) 52 70  Resp: 16 18  Temp: 36.7 C (!) 36.3 C  SpO2: 97% 94%    Last Pain:  Vitals:   06/01/24 0202  TempSrc:   PainSc: Asleep                 Alfrieda LILLETTE Lan

## 2024-06-02 MED ORDER — ONDANSETRON HCL 4 MG PO TABS: 4.0000 mg | ORAL_TABLET | Freq: Four times a day (QID) | ORAL | 0 refills | Status: AC | PRN

## 2024-06-02 MED ORDER — GLYCERIN (LAXATIVE) 2 G RE SUPP
1.0000 | Freq: Once | RECTAL | Status: AC
  Administered 2024-06-02: 1 via RECTAL
  Filled 2024-06-02: qty 1

## 2024-06-02 NOTE — Plan of Care (Signed)

## 2024-06-02 NOTE — TOC Transition Note (Signed)
 Transition of Care [facility]) - Discharge Note   Patient Details  Name: Brandelyn Henne MRN: [id number] Date of Birth: 02-13-1935  [facility]) CM/SW Contact:  [place] L Serai Tukes, LCSW Phone Number: 06/02/2024, 11:01 AM   Clinical Narrative:     Bed search completed. Patient received a bed offer at [facility]. Family was advised and they accepted the bed. Insurance Authorization was completed. Patient is medically ready for discharge today. Medical Team updated. Patient will go to room 1201 [facility].   TOC signing off.   Final next level of care: Skilled Nursing Facility Barriers to Discharge: No Barriers Identified   Patient Goals and CMS Choice     Choice offered to / list presented to : Patient [place] ownership interest in [facility].provided to:: Patient    Discharge Placement                Patient to be transferred to facility by: [facility] Name of family member notified: Landry Funk Patient and family notified of of transfer: 06/02/24  Discharge Plan and Services Additional resources added to the After Visit Summary for                                       Social Drivers of Health (SDOH) Interventions SDOH Screenings   Food Insecurity: No Food Insecurity (05/30/2024)  Housing: Low Risk  (05/30/2024)  Transportation Needs: No Transportation Needs (05/30/2024)  Utilities: Not At Risk (05/30/2024)  Depression (PHQ2-9): Low Risk  (12/23/2023)  Financial Resource Strain: Low Risk  (04/17/2024)   Received from [facility]  Social Connections: Unknown (03/07/2024)  Tobacco Use: Low Risk  (05/30/2024)     Readmission Risk Interventions     No data to display

## 2024-06-02 NOTE — Progress Notes (Signed)
 Gave report to [place] at [facility]. Corean acknowledged understanding. Patient waiting for EMS transportation to [facility].

## 2024-06-02 NOTE — Plan of Care (Signed)
  Problem: Education: Goal: Knowledge of the prescribed therapeutic regimen will improve Outcome: Progressing Goal: Understanding of discharge needs will improve Outcome: Progressing   

## 2024-06-02 NOTE — Progress Notes (Signed)
 Physical Therapy Treatment Patient Details Name: Yvonne Singleton MRN: [id number] DOB: 02-16-1935 Today's Date: 06/02/2024   History of Present Illness 89 y/o female s/p L THA revision on 05/30/24. PMH: HTN, breast cancer, glaucoma, Parkinson's, RA, hx L THA with recurrent dislocations    PT Comments  Patient received reclining in bed and agreeable to PT. She states her goal is to walk better now that she had her hip surgery. She needed min A for bed mobility and to maintain hip precautions during supine to sit (she recalled all 3 precautions but seemed unaware of following them). She needed CGA - minA for transfers bed to [facility] to chair due to slight backwards lean and difficulty shifting forwards sufficiently. She demo good awareness of body and AD positioning just not quite enough forward lean, stooped posture, and LE weakness. She needed assistance replacing her wet brief. Patient ambulated 15 feet with RW chair follow before requesting to sit down due to fatigue in R > L LE. She needed cuing to stand up taller and had decreased SpO2 to 88% with ambulation. She demo short step length and low foot clearance. Pateint is making progress towards her goals. Patient would benefit from skilled physical therapy to address impairments and functional limitations (see PT Problem List below) to work towards stated goals and return to PLOF or maximal functional independence.     If plan is discharge home, recommend the following: A little help with walking and/or transfers;A little help with bathing/dressing/bathroom;Assistance with cooking/housework;Assist for transportation   Can travel by private vehicle     Yes  Equipment Recommendations  Other (comment) (TBD in next venue of care)    Recommendations for Other Services       Precautions / Restrictions Precautions Precautions: Fall;Posterior Hip Precaution Booklet Issued: No Recall of Precautions/Restrictions: Intact Precaution/Restrictions  Comments: pt able to recall 3/3 posterior hip precautions at start of session without cueing Restrictions Weight Bearing Restrictions Per Provider Order: Yes LLE Weight Bearing Per Provider Order: Weight bearing as tolerated     Mobility  Bed Mobility Overal bed mobility: Needs Assistance Bed Mobility: Supine to Sit     Supine to sit: Min assist, Used rails     General bed mobility comments: cuing for log roll and assistance with placing pillows between knees to prevent hip adduction    Transfers Overall transfer level: Needs assistance Equipment used: Rolling walker (2 wheels) Transfers: Sit to/from Stand, Bed to chair/wheelchair/BSC Sit to Stand: Min assist, Contact guard assist           General transfer comment: Patient transfered bed to [facility], BSC to chair, and chair to chair with CGA to min A increased time and cuing for forward shift. she demonstrated good understanding of proper hand placement and body mechanics, but did not always get weight shifted far enough forwards to be successful. She also stood with stooped posture.    Ambulation/Gait Ambulation/Gait assistance: Contact guard assist Gait Distance (Feet): 15 Feet Assistive device: Rolling walker (2 wheels) Gait Pattern/deviations: Trunk flexed, Decreased stride length Gait velocity: very slow     General Gait Details: Patient ambulated 15 feet with RW chair follow before requesting to sit down due to fatigue in R > L LE. She needed cuing to stand up taller and had decreased SpO2 to 88% with ambulation. She demo short step length and low foot clearance.   Stairs             Psychologist, prison and probation services  Tilt Bed    Modified Rankin (Stroke Patients Only)       Balance Overall balance assessment: Needs assistance Sitting-balance support: Feet supported Sitting balance-Leahy Scale: Good     Standing balance support: Bilateral upper extremity supported, Reliant on assistive device for  balance Standing balance-Leahy Scale: Fair Standing balance comment: heavy UE use, but able to take down breifs before using [facility]                            Communication Communication Communication: Impaired Factors Affecting Communication: Reduced clarity of speech;Hearing impaired (soft spoken)  Cognition Arousal: Alert Behavior During Therapy: WFL for tasks assessed/performed   PT - Cognitive impairments: No apparent impairments                         Following commands: Intact      Cueing Cueing Techniques: Verbal cues  Exercises Other Exercises Other Exercises: reviewed hip precautions. Pt assisted with pericare and breif replacement after using BSC    General Comments General comments (skin integrity, edema, etc.): SpO2 dropped to 88% after walking on room air.      Pertinent Vitals/Pain Pain Assessment Pain Assessment: 0-10 Pain Score: 4  Pain Location: L hip Pain Descriptors / Indicators: Discomfort Pain Intervention(s): Limited activity within patient's tolerance, Monitored during session, Repositioned    Home Living                          Prior Function            PT Goals (current goals can now be found in the care plan section) Acute Rehab PT Goals Patient Stated Goal: to walk better now that her hip is replaced PT Goal Formulation: With patient Time For Goal Achievement: 08/25/24 Potential to Achieve Goals: Good Progress towards PT goals: Progressing toward goals    Frequency    7X/week      PT Plan      Co-evaluation              AM-PAC PT 6 Clicks Mobility   Outcome Measure  Help needed turning from your back to your side while in a flat bed without using bedrails?: A Little Help needed moving from lying on your back to sitting on the side of a flat bed without using bedrails?: A Little Help needed moving to and from a bed to a chair (including a wheelchair)?: A Little Help needed standing up  from a chair using your arms (e.g., wheelchair or bedside chair)?: A Little Help needed to walk in hospital room?: Total Help needed climbing 3-5 steps with a railing? : Total 6 Click Score: 14    End of Session Equipment Utilized During Treatment: Gait belt Activity Tolerance: Patient tolerated treatment well Patient left: in chair;with call bell/phone within reach;with chair alarm set Nurse Communication: Mobility status PT Visit Diagnosis: Unsteadiness on feet (R26.81);Muscle weakness (generalized) (M62.81);Other abnormalities of gait and mobility (R26.89)     Time: 9042-8967 PT Time Calculation (min) (ACUTE ONLY): 35 min  Charges:    $Therapeutic Activity: 23-37 mins PT General Charges $$ ACUTE PT VISIT: 1 Visit                     Camie SAUNDERS. Juli, PT, DPT, Cert. MDT 06/02/24, 10:58 AM

## 2024-07-09 ENCOUNTER — Ambulatory Visit: Admitting: Internal Medicine

## 2024-07-17 ENCOUNTER — Encounter: Payer: Self-pay | Admitting: Internal Medicine

## 2024-07-17 ENCOUNTER — Ambulatory Visit (INDEPENDENT_AMBULATORY_CARE_PROVIDER_SITE_OTHER): Admitting: Internal Medicine

## 2024-07-17 VITALS — BP 108/62 | HR 62 | Ht 63.5 in

## 2024-07-17 DIAGNOSIS — J040 Acute laryngitis: Secondary | ICD-10-CM

## 2024-07-17 DIAGNOSIS — G20A2 Parkinson's disease without dyskinesia, with fluctuations: Secondary | ICD-10-CM | POA: Diagnosis not present

## 2024-07-17 DIAGNOSIS — M961 Postlaminectomy syndrome, not elsewhere classified: Secondary | ICD-10-CM

## 2024-07-17 DIAGNOSIS — I10 Essential (primary) hypertension: Secondary | ICD-10-CM | POA: Diagnosis not present

## 2024-07-17 DIAGNOSIS — E782 Mixed hyperlipidemia: Secondary | ICD-10-CM

## 2024-07-17 DIAGNOSIS — K219 Gastro-esophageal reflux disease without esophagitis: Secondary | ICD-10-CM

## 2024-07-17 MED ORDER — METHYLPREDNISOLONE 4 MG PO TBPK: ORAL_TABLET | ORAL | 0 refills | Status: DC

## 2024-07-17 NOTE — Progress Notes (Signed)
 Established Patient Office Visit  Subjective:  Patient ID: Yvonne Singleton, female    DOB: [birth year]  Age: 89 y.o. MRN: [id number]  Chief Complaint  Patient presents with   Follow-up    3 month follow up    Patient comes in for her follow-up today accompanied by her daughter.  She has recently undergone left hip replacement, revision, due to recurrent dislocations.  Today she is feeling much better, has no pain in the left hip, undergoing PT/OT at her nursing home.  However patient has laryngitis, with a strained voice, but does not complain of pain in her throat, no cough, no shortness of breath and no fevers or chills. She was recently seen by Dr. Loreli for her Parkinson's, medications were not changed.  Although she feels there is a worsening of her right hand tremor.  Her Gabapentin  dosing was also disturbed postoperatively, as a result her neuropathy got worse.  Now it has been resumed at 300 mg 3 times daily.  Patient reports that she feels drowsy and wonders if she can lower the dose.  Advised to start taking 600 mg at bedtime, skip the morning dose and then can have the second dose in the afternoon or early evening.  Will not reduce any further as she continues to have neuropathy symptoms. Will check labs at next visit.    No other concerns at this time.   Past Medical History:  Diagnosis Date   Acute metabolic encephalopathy 05/12/2022   Aortic atherosclerosis    Back pain    Breast cancer, right (HCC) 2007   a.) Tx'd with partial mastectomy (lumpectomy) + adjuvant XRT   Cerebral microvascular disease    CKD (chronic kidney disease) stage 3, GFR 30-59 ml/min (HCC)    Collagen vascular disease    Depression    Diastolic dysfunction    Fatty liver    GERD (gastroesophageal reflux disease)    Glaucoma    Gout    Hard of hearing    Headache 05/16/2022   History of bilateral hip replacements    History of hiatal hernia    Hyperlipidemia    Hypertension    Lives  in assisted living facility    [facility]   Macular degeneration    Parkinson's disease [facility])    Peripheral neuropathy    RA (rheumatoid arthritis) (HCC)    Recurrent dislocation of hip    Spinal stenosis of lumbar region    Ventral hernia    Vitamin B 12 deficiency     Past Surgical History:  Procedure Laterality Date   ABDOMINAL HYSTERECTOMY     ANTERIOR HIP REVISION Right 04/08/2022   Procedure: Anterior hip revision cup and femoral head;  Surgeon: Kathlynn Sharper, MD;  Location: [facility] ORS;  Service: Orthopedics;  Laterality: Right;   BREAST LUMPECTOMY Right 2007   suspicious calcs, f/u with radiation   CATARACT EXTRACTION Bilateral    ESOPHAGOGASTRODUODENOSCOPY (EGD) WITH PROPOFOL  N/A 05/25/2017   Procedure: ESOPHAGOGASTRODUODENOSCOPY (EGD) WITH PROPOFOL ;  Surgeon: Gaylyn Gladis PENNER, MD;  Location[facility] ENDOSCOPY;  Service: Endoscopy;  Laterality: N/A;   ESOPHAGOGASTRODUODENOSCOPY (EGD) WITH PROPOFOL  N/A 05/09/2018   Procedure: ESOPHAGOGASTRODUODENOSCOPY (EGD) WITH PROPOFOL ;  Surgeon: Unk Corinn Skiff, MD;  Location: [facility] ENDOSCOPY;  Service: Gastroenterology;  Laterality: N/A;   ESOPHAGOGASTRODUODENOSCOPY (EGD) WITH PROPOFOL  N/A 12/09/2021   Procedure: ESOPHAGOGASTRODUODENOSCOPY (EGD) WITH PROPOFOL ;  Surgeon: Unk Corinn Skiff, MD;  Location: [facility] ENDOSCOPY;  Service: Gastroenterology;  Laterality: N/A;   EYE SURGERY Bilateral  HAND SURGERY     HIP CLOSED REDUCTION Left 06/24/2015   Procedure: CLOSED REDUCTION HIP;  Surgeon: Lynwood SHAUNNA Hue, MD;  Location: [facility] ORS;  Service: Orthopedics;  Laterality: Left;   HIP CLOSED REDUCTION Left 02/09/2016   Procedure: CLOSED MANIPULATION HIP;  Surgeon: Kayla Pinal, MD;  Location: [facility] ORS;  Service: Orthopedics;  Laterality: Left;   HIP CLOSED REDUCTION Left 10/31/2021   Procedure: CLOSED REDUCTION HIP;  Surgeon: Edie Norleen PARAS, MD;  Location: [facility] ORS;  Service: Orthopedics;  Laterality: Left;   HIP CLOSED REDUCTION Left 07/05/2022    Procedure: CLOSED REDUCTION HIP;  Surgeon: Hue Lynwood SHAUNNA, MD;  Location: [facility] OR[facility];  Service: Orthopedics;  Laterality: Left;   HIP CLOSED REDUCTION Left 01/17/2023   Procedure: CLOSED REDUCTION HIP;  Surgeon: Lorelle Hussar, MD;  Location: [facility];  Service: Orthopedics;  Laterality: Left;   HIP CLOSED REDUCTION Left 05/17/2023   Procedure: CLOSED REDUCTION HIP;  Surgeon: Edie Norleen PARAS, MD;  Location: [facility] OR[facility];  Service: Orthopedics;  Laterality: Left;   HIP CLOSED REDUCTION Left 03/07/2024   Procedure: CLOSED REDUCTION, HIP;  Surgeon: Tobie Priest, MD;  Location: [facility];  Service: Orthopedics;  Laterality: Left;   HIP SURGERY     JOINT REPLACEMENT     bilateral hip   OOPHORECTOMY     TOTAL HIP ARTHROPLASTY WITH HARDWARE REMOVAL Left 05/30/2024   Procedure: REVISION, ARTHROPLASTY, HIP;  Surgeon: Hue Lynwood SHAUNNA, MD;  Location: [facility];  Service: Orthopedics;  Laterality: Left;   TOTAL KNEE ARTHROPLASTY Bilateral     Social History   Socioeconomic History   Marital status: Widowed    Spouse name: Not on file   Number of children: Not on file   Years of education: Not on file   Highest education level: Not on file  Occupational History   Not on file  Tobacco Use   Smoking status: Never    Passive exposure: Never   Smokeless tobacco: Never  Vaping Use   Vaping status: Never Used  Substance and Sexual Activity   Alcohol use: No    Alcohol/week: 0.0 standard drinks of alcohol   Drug use: No   Sexual activity: Not Currently  Other Topics Concern   Not on file  Social History Narrative   Not on file   Social Drivers of Health   Financial Resource Strain: Low Risk  (04/17/2024)   Received from [facility]   Overall Financial Resource Strain (CARDIA)    Difficulty of Paying Living Expenses: Not hard at all  Food Insecurity: No Food Insecurity (05/30/2024)   Hunger Vital Sign    Worried About Running Out of Food in the Last Year: Never true    Ran Out of  Food in the Last Year: Never true  Transportation Needs: No Transportation Needs (05/30/2024)   PRAPARE - Administrator, Civil Service (Medical): No    Lack of Transportation (Non-Medical): No  Physical Activity: Not on file  Stress: Not on file  Social Connections: Unknown (03/07/2024)   Social Connection and Isolation Panel    Frequency of Communication with Friends and Family: More than three times a week    Frequency of Social Gatherings with Friends and Family: Once a week    Attends Religious Services: 1 to 4 times per year    Active Member of [employer] or Organizations: No    Attends Banker Meetings: Not on file    Marital Status: Not on file  Intimate Partner Violence: Not At Risk (05/30/2024)   Humiliation, Afraid, Rape, and Kick questionnaire    Fear of Current or Ex-Partner: No    Emotionally Abused: No    Physically Abused: No    Sexually Abused: No    Family History  Problem Relation Age of Onset   Heart disease Mother    Arthritis Mother    Heart disease Father    Ulcers Father    Breast cancer Maternal Aunt     Allergies  Allergen Reactions   Streptomycin Hives   Digoxin Other (See Comments)    Other reaction(s): Unknown Note: pt had lethargy, felt bad Note: pt had lethargy, felt bad  Other reaction(s): Unknown Note: pt had lethargy, felt bad  Other reaction(s): Unknown Note: pt had lethargy, felt bad Note: pt had lethargy, felt bad   Trospium Nausea And Vomiting   Citric Acid Other (See Comments)    Unknown reaction per patient   Iodinated Contrast Media Hives    Outpatient Medications Prior to Visit  Medication Sig   acetaminophen  (TYLENOL ) 500 MG tablet Take 500 mg by mouth every 8 (eight) hours as needed (headache (Max 4000 mg/day)).   Alpha-Lipoic Acid 600 MG TABS Take 600 mg by mouth in the morning.   aspirin  81 MG chewable tablet Chew 1 tablet (81 mg total) by mouth 2 (two) times daily.   Calcium  Carbonate-Vitamin D   600-200 MG-UNIT TABS Take 1 tablet by mouth 2 (two) times daily.   carbidopa -levodopa  (SINEMET  CR) 50-200 MG per tablet Take 1 tablet by mouth 3 (three) times daily.   celecoxib  (CELEBREX ) 200 MG capsule Take 1 capsule (200 mg total) by mouth 2 (two) times daily.   chlorhexidine  (HIBICLENS ) 4 % external liquid Apply 15 mLs (1 Application total) topically as directed for 30 doses. Use as directed daily for 5 days every other week for 6 weeks.   cyanocobalamin  (VITAMIN B12) 1000 MCG tablet Take 1,000 mcg by mouth daily.   gabapentin  (NEURONTIN ) 300 MG capsule Take 300 mg by mouth 3 (three) times daily.   latanoprost  (XALATAN ) 0.005 % ophthalmic solution Place 1 drop into both eyes at bedtime.   ondansetron  (ZOFRAN ) 4 MG tablet Take 1 tablet (4 mg total) by mouth every 6 (six) hours as needed for nausea.   oxyCODONE  (OXY IR/ROXICODONE ) 5 MG immediate release tablet Take 1 tablet (5 mg total) by mouth every 4 (four) hours as needed for moderate pain (pain score 4-6) (pain score 4-6).   pantoprazole  (PROTONIX ) 40 MG tablet Take 1 tablet (40 mg total) by mouth 2 (two) times daily before a meal.   sertraline  (ZOLOFT ) 100 MG tablet Take 100 mg by mouth in the morning.   traMADol  (ULTRAM ) 50 MG tablet Take 1-2 tablets (50-100 mg total) by mouth every 4 (four) hours as needed for moderate pain (pain score 4-6).   trimethoprim  (TRIMPEX ) 100 MG tablet Take 1 tablet (100 mg total) by mouth daily.   No facility-administered medications prior to visit.    Review of Systems  Constitutional: Negative.  Negative for chills, fever and malaise/fatigue.  HENT: Negative.  Negative for congestion and sore throat.   Eyes: Negative.  Negative for blurred vision and pain.  Respiratory: Negative.  Negative for cough and shortness of breath.   Cardiovascular: Negative.  Negative for chest pain, palpitations and leg swelling.  Gastrointestinal: Negative.  Negative for abdominal pain, blood in stool, constipation,  diarrhea, heartburn, melena, nausea and vomiting.  Genitourinary: Negative.  Negative for  dysuria, flank pain, frequency and urgency.  Musculoskeletal: Negative.  Negative for joint pain and myalgias.  Skin: Negative.   Neurological:  Positive for sensory change. Negative for dizziness, tingling, weakness and headaches.  Endo/Heme/Allergies: Negative.   Psychiatric/Behavioral: Negative.  Negative for depression and suicidal ideas. The patient is not nervous/anxious.        Objective:   BP 108/62   Pulse 62   Ht 5' 3.5 (1.613 m)   SpO2 97%   BMI 31.73 kg/m   Vitals:   07/17/24 1137  BP: 108/62  Pulse: 62  Height: 5' 3.5 (1.613 m)  Weight: Comment: pt unable to weigh  SpO2: 97%    Physical Exam Vitals and nursing note reviewed.  Constitutional:      Appearance: Normal appearance.  HENT:     Head: Normocephalic and atraumatic.     Nose: Nose normal.     Mouth/Throat:     Mouth: Mucous membranes are moist.     Pharynx: Oropharynx is clear.  Eyes:     Conjunctiva/sclera: Conjunctivae normal.     Pupils: Pupils are equal, round, and reactive to light.  Cardiovascular:     Rate and Rhythm: Normal rate and regular rhythm.     Pulses: Normal pulses.     Heart sounds: Normal heart sounds. No murmur heard. Pulmonary:     Effort: Pulmonary effort is normal.     Breath sounds: Normal breath sounds. No wheezing.  Abdominal:     General: Bowel sounds are normal.     Palpations: Abdomen is soft.     Tenderness: There is no abdominal tenderness. There is no right CVA tenderness or left CVA tenderness.  Musculoskeletal:        General: Normal range of motion.     Cervical back: Normal range of motion.     Right lower leg: No edema.     Left lower leg: No edema.  Skin:    General: Skin is warm and dry.  Neurological:     General: No focal deficit present.     Mental Status: She is alert and oriented to person, place, and time.  Psychiatric:        Mood and Affect: Mood  normal.        Behavior: Behavior normal.      No results found for any visits on 07/17/24.  Recent Results (from the past 2160 hours)  C-reactive protein     Status: None   Collection Time: 05/21/24  9:56 AM  Result Value Ref Range   CRP <0.5 <1.0 mg/dL    Comment: Performed at [facility], [address] [place], [place] [address]  Sedimentation rate     Status: None   Collection Time: 05/21/24  9:56 AM  Result Value Ref Range   Sed Rate 25 0 - 30 mm/hr    Comment: Performed at [facility], [address] [place], [place] [address]  CBC WITH DIFFERENTIAL     Status: None   Collection Time: 05/21/24  9:56 AM  Result Value Ref Range   WBC 5.7 4.0 - 10.5 K/uL   RBC 3.93 3.87 - 5.11 MIL/uL   Hemoglobin 12.2 12.0 - 15.0 g/dL   HCT 61.7 63.9 - 53.9 %   MCV 97.2 80.0 - 100.0 fL   MCH 31.0 26.0 - 34.0 pg   MCHC 31.9 30.0 - 36.0 g/dL   RDW 86.8 88.4 - 84.4 %   Platelets 231 150 - 400  K/uL   nRBC 0.0 0.0 - 0.2 %   Neutrophils Relative % 68 %   Neutro Abs 3.9 1.7 - 7.7 K/uL   Lymphocytes Relative 19 %   Lymphs Abs 1.1 0.7 - 4.0 K/uL   Monocytes Relative 7 %   Monocytes Absolute 0.4 0.1 - 1.0 K/uL   Eosinophils Relative 4 %   Eosinophils Absolute 0.2 0.0 - 0.5 K/uL   Basophils Relative 1 %   Basophils Absolute 0.0 0.0 - 0.1 K/uL   Immature Granulocytes 1 %   Abs Immature Granulocytes 0.04 0.00 - 0.07 K/uL    Comment: Performed at [facility], [address] [place], [place] [address]  Comprehensive metabolic panel     Status: Abnormal   Collection Time: 05/21/24  9:56 AM  Result Value Ref Range   Sodium 143 135 - 145 mmol/L   Potassium 4.5 3.5 - 5.1 mmol/L   Chloride 105 98 - 111 mmol/L   CO2 27 22 - 32 mmol/L   Glucose, Bld 94 70 - 99 mg/dL    Comment: Glucose reference range applies only to samples taken after fasting for at least 8 hours.   BUN 54 (H) 8 - 23 mg/dL   Creatinine, Ser 8.69 (H) 0.44 - 1.00 mg/dL   Calcium  9.3 8.9 - 10.3  mg/dL   Total Protein 6.8 6.5 - 8.1 g/dL   Albumin 3.6 3.5 - 5.0 g/dL   AST 16 15 - 41 U/L   ALT <5 0 - 44 U/L   Alkaline Phosphatase 78 38 - 126 U/L   Total Bilirubin 0.8 0.0 - 1.2 mg/dL   GFR, Estimated 40 (L) >60 mL/min    Comment: (NOTE) Calculated using the CKD-EPI Creatinine Equation (2021)    Anion gap 11 5 - 15    Comment: Performed at [facility], [address] [place], [place] [address]  Urinalysis, Routine w reflex microscopic -Urine, Clean Catch     Status: Abnormal   Collection Time: 05/21/24  9:56 AM  Result Value Ref Range   Color, Urine YELLOW (A) YELLOW   APPearance CLEAR (A) CLEAR   Specific Gravity, Urine 1.009 1.005 - 1.030   pH 5.0 5.0 - 8.0   Glucose, UA NEGATIVE NEGATIVE mg/dL   Hgb urine dipstick SMALL (A) NEGATIVE   Bilirubin Urine NEGATIVE NEGATIVE   Ketones, ur NEGATIVE NEGATIVE mg/dL   Protein, ur NEGATIVE NEGATIVE mg/dL   Nitrite NEGATIVE NEGATIVE   Leukocytes,Ua SMALL (A) NEGATIVE   RBC / HPF 0-5 0 - 5 RBC/hpf   WBC, UA 11-20 0 - 5 WBC/hpf   Bacteria, UA RARE (A) NONE SEEN   Squamous Epithelial / HPF 0-5 0 - 5 /HPF   Mucus PRESENT    Hyaline Casts, UA PRESENT     Comment: Performed at [facility], [address], [place], [place] [address]  Urine Culture Add-On     Status: Abnormal   Collection Time: 05/21/24  9:56 AM   Specimen: Urine, Clean Catch  Result Value Ref Range   Specimen Description      URINE, CLEAN CATCH Performed at [facility], [address], [place], [place] [address]    Special Requests      NONE Performed at [facility], [address] [place], [place] [address]    Culture >=100,000 COLONIES/mL ESCHERICHIA COLI (A)    Report Status 05/23/2024 FINAL    Organism ID, Bacteria ESCHERICHIA COLI (A)  Susceptibility   Escherichia coli - MIC*    AMPICILLIN >=32 RESISTANT Resistant     CEFAZOLIN  <=4 SENSITIVE Sensitive     CEFEPIME <=0.12 SENSITIVE Sensitive      CEFTRIAXONE  <=0.25 SENSITIVE Sensitive     CIPROFLOXACIN  >=4 RESISTANT Resistant     GENTAMICIN <=1 SENSITIVE Sensitive     IMIPENEM <=0.25 SENSITIVE Sensitive     NITROFURANTOIN <=16 SENSITIVE Sensitive     TRIMETH/SULFA >=320 RESISTANT Resistant     AMPICILLIN/SULBACTAM 8 SENSITIVE Sensitive     PIP/TAZO <=4 SENSITIVE Sensitive ug/mL    * >=100,000 COLONIES/mL ESCHERICHIA COLI  Type and screen [facility]     Status: None   Collection Time: 05/21/24 10:06 AM  Result Value Ref Range   ABO/RH(D) O POS    Antibody Screen NEG    Sample Expiration 06/04/2024,2359    Extend sample reason      NO TRANSFUSIONS OR PREGNANCY IN THE PAST 3 MONTHS Performed at [facility], [address] [place], [place] [address]   Surgical pcr screen     Status: Abnormal   Collection Time: 05/21/24 12:00 PM   Specimen: Nasal Mucosa; Nasal Swab  Result Value Ref Range   MRSA, PCR NEGATIVE NEGATIVE   Staphylococcus aureus POSITIVE (A) NEGATIVE    Comment: (NOTE) The Xpert SA Assay (FDA approved for NASAL specimens in patients 52 years of age and older), is one component of a comprehensive surveillance program. It is not intended to diagnose infection nor to guide or monitor treatment. Performed at [facility], [address], [place], [place] [address]       Assessment & Plan:  Gabapentin  dosing schedule adjusted.  Medrol Dosepak for laryngitis, voice rest, adequate hydration.  Advised to continue the rest of medications.  Continue PT/OT.  Encourage ambulation as tolerated.. Problem List Items Addressed This Visit     Lumbar post-laminectomy syndrome   Hyperlipidemia   Parkinson disease (HCC)   Relevant Medications   gabapentin  (NEURONTIN ) 300 MG capsule   GERD (gastroesophageal reflux disease)   Essential hypertension, benign   Other Visit Diagnoses       Laryngitis    -  Primary   Relevant Medications   methylPREDNISolone (MEDROL DOSEPAK) 4 MG  TBPK tablet       Follow up 3 months.  Total time spent: 30 minutes  FERNAND FREDY RAMAN, MD  07/17/2024   This document may have been prepared by [facility] Voice Recognition software and as such may include unintentional dictation errors.

## 2024-08-07 ENCOUNTER — Telehealth: Payer: Self-pay

## 2024-08-07 DIAGNOSIS — L89321 Pressure ulcer of left buttock, stage 1: Secondary | ICD-10-CM

## 2024-08-07 NOTE — Telephone Encounter (Signed)
 Dottie with [facility] called asking for orders for wound care states the patient has a couple areas on her bottom area, she said zinc  oxide may work but would like to get orders faxed over to [phone]

## 2024-08-08 MED ORDER — ZINC OXIDE 40 % EX OINT: 1.0000 | TOPICAL_OINTMENT | CUTANEOUS | 0 refills | Status: AC | PRN

## 2024-08-08 NOTE — Addendum Note (Signed)
 Addended by: FERNAND FREDY RAMAN on: 08/08/2024 08:46 AM   Modules accepted: Orders

## 2024-08-08 NOTE — Addendum Note (Signed)
 Addended by: FERNAND FREDY RAMAN on: 08/08/2024 09:00 AM   Modules accepted: Orders

## 2024-08-13 ENCOUNTER — Ambulatory Visit: Payer: Self-pay | Admitting: Urology

## 2024-08-20 ENCOUNTER — Ambulatory Visit (INDEPENDENT_AMBULATORY_CARE_PROVIDER_SITE_OTHER): Admitting: Internal Medicine

## 2024-08-20 ENCOUNTER — Encounter: Payer: Self-pay | Admitting: Internal Medicine

## 2024-08-20 VITALS — BP 124/70 | HR 88 | Ht 63.5 in

## 2024-08-20 DIAGNOSIS — I10 Essential (primary) hypertension: Secondary | ICD-10-CM | POA: Diagnosis not present

## 2024-08-20 DIAGNOSIS — R600 Localized edema: Secondary | ICD-10-CM

## 2024-08-20 DIAGNOSIS — J040 Acute laryngitis: Secondary | ICD-10-CM | POA: Insufficient documentation

## 2024-08-20 DIAGNOSIS — Z23 Encounter for immunization: Secondary | ICD-10-CM | POA: Diagnosis not present

## 2024-08-20 DIAGNOSIS — Z96641 Presence of right artificial hip joint: Secondary | ICD-10-CM

## 2024-08-20 DIAGNOSIS — L89152 Pressure ulcer of sacral region, stage 2: Secondary | ICD-10-CM | POA: Diagnosis not present

## 2024-08-20 DIAGNOSIS — H353230 Exudative age-related macular degeneration, bilateral, stage unspecified: Secondary | ICD-10-CM | POA: Insufficient documentation

## 2024-08-20 DIAGNOSIS — F0280 Dementia in other diseases classified elsewhere without behavioral disturbance: Secondary | ICD-10-CM

## 2024-08-20 DIAGNOSIS — G20A1 Parkinson's disease without dyskinesia, without mention of fluctuations: Secondary | ICD-10-CM

## 2024-08-20 DIAGNOSIS — H9190 Unspecified hearing loss, unspecified ear: Secondary | ICD-10-CM | POA: Insufficient documentation

## 2024-08-20 DIAGNOSIS — N1832 Chronic kidney disease, stage 3b: Secondary | ICD-10-CM | POA: Insufficient documentation

## 2024-08-20 MED ORDER — ALUMINUM-PETROLATUM-ZINC (1-2-3 PASTE) 0.027-13.7-10% PASTE: 1.0000 | PASTE | CUTANEOUS | 3 refills | Status: AC | PRN

## 2024-08-20 MED ORDER — ALLEVYN GENTLE BORDER SACRUM EX PADS: MEDICATED_PAD | CUTANEOUS | 3 refills | Status: AC

## 2024-08-20 MED ORDER — FUROSEMIDE 20 MG PO TABS: 20.0000 mg | ORAL_TABLET | Freq: Every day | ORAL | 11 refills | Status: AC

## 2024-08-20 NOTE — Progress Notes (Signed)
 Established Patient Office Visit  Subjective:  Patient ID: Yvonne Singleton, female    DOB: 20-Jun-1935  Age: 89 y.o. MRN: [id number]  Chief Complaint  Patient presents with   Leg Swelling    Patient is here today with c/o bilateral leg edema getting worse . She is accompanied by her daughter. Patient reports she also felt  bilateral leg weakness and was having difficulty transferring off the commode but otherwise feels well today.She believes she is still on Lasix  20 mg/d- but not seen on her med list- will restart.  Patient reports prednisone did not help her laryngitis. Her voice is still hoarse. She denies shortness of breath. ENT referral sent for laryngitis and decreased hearing.  She reports taking lasix  once daily. This medication is not listed on her medication list anymore with her assisted living facility. Will restart Lasix  daily. Recommend medium strength compression stockings daily and elevated feet throughout the day. Patient to return in 2 weeks for BMP to monitor potassium.  Patient reports pressure sore has not changed since starting [place]. Will order 123 paste to be used every 8 hours as needed for skin breakdown and sacral pressure dressings to be changed daily and as needed if it becomes contaminated. Also recommend alternating sitting on donut verses foam pillow in wheelchair and alternating weight from side to side every 1-2 hours to reduce pressure on pressure wound.   Patient denies any pain since having her hip replacement. Will discontinue oxycodone , Celebrex  and tramadol  at this time. Continue gabapentin  as prescribed.  Patient would like flu shot today and denies having any reaction to prior flu shots; will proceed with administration today. Patient would also like to have DNR paperwork completed today. She verbalized not wanting compressions nor intubation or medications in the presence of her provider and her daughter.     No other concerns at this time.    Past Medical History:  Diagnosis Date   Acute metabolic encephalopathy 05/12/2022   Aortic atherosclerosis    Back pain    Breast cancer, right (HCC) 2007   a.) Tx'd with partial mastectomy (lumpectomy) + adjuvant XRT   Cerebral microvascular disease    CKD (chronic kidney disease) stage 3, GFR 30-59 ml/min (HCC)    Collagen vascular disease    Depression    Diastolic dysfunction    Fatty liver    GERD (gastroesophageal reflux disease)    Glaucoma    Gout    Hard of hearing    Headache 05/16/2022   History of bilateral hip replacements    History of hiatal hernia    Hyperlipidemia    Hypertension    Lives in assisted living facility    [facility]   Macular degeneration    Parkinson's disease [facility])    Peripheral neuropathy    RA (rheumatoid arthritis) (HCC)    Recurrent dislocation of hip    Spinal stenosis of lumbar region    Ventral hernia    Vitamin B 12 deficiency     Past Surgical History:  Procedure Laterality Date   ABDOMINAL HYSTERECTOMY     ANTERIOR HIP REVISION Right 04/08/2022   Procedure: Anterior hip revision cup and femoral head;  Surgeon: Kathlynn Sharper, MD;  Location: [facility];  Service: Orthopedics;  Laterality: Right;   BREAST LUMPECTOMY Right 2007   suspicious calcs, f/u with radiation   CATARACT EXTRACTION Bilateral    ESOPHAGOGASTRODUODENOSCOPY (EGD) WITH PROPOFOL  N/A 05/25/2017   Procedure: ESOPHAGOGASTRODUODENOSCOPY (EGD) WITH PROPOFOL ;  Surgeon: Gaylyn,  Gladis PENNER, MD;  Location: [facility];  Service: Endoscopy;  Laterality: N/A;   ESOPHAGOGASTRODUODENOSCOPY (EGD) WITH PROPOFOL  N/A 05/09/2018   Procedure: ESOPHAGOGASTRODUODENOSCOPY (EGD) WITH PROPOFOL ;  Surgeon: Unk Corinn Skiff, MD;  Location: [facility] END[facility];  Service: Gastroenterology;  Laterality: N/A;   ESOPHAGOGASTRODUODENOSCOPY (EGD) WITH PROPOFOL  N/A 12/09/2021   Procedure: ESOPHAGOGASTRODUODENOSCOPY (EGD) WITH PROPOFOL ;  Surgeon: Unk Corinn Skiff, MD;  Location: [facility];   Service: Gastroenterology;  Laterality: N/A;   EYE SURGERY Bilateral    HAND SURGERY     HIP CLOSED REDUCTION Left 06/24/2015   Procedure: CLOSED REDUCTION HIP;  Surgeon: Lynwood SHAUNNA Hue, MD;  Location: [facility];  Service: Orthopedics;  Laterality: Left;   HIP CLOSED REDUCTION Left 02/09/2016   Procedure: CLOSED MANIPULATION HIP;  Surgeon: Kayla Pinal, MD;  Location: [facility];  Service: Orthopedics;  Laterality: Left;   HIP CLOSED REDUCTION Left 10/31/2021   Procedure: CLOSED REDUCTION HIP;  Surgeon: Edie Norleen PARAS, MD;  Location: [facility];  Service: Orthopedics;  Laterality: Left;   HIP CLOSED REDUCTION Left 07/05/2022   Procedure: CLOSED REDUCTION HIP;  Surgeon: Hue Lynwood SHAUNNA, MD;  Location: [facility];  Service: Orthopedics;  Laterality: Left;   HIP CLOSED REDUCTION Left 01/17/2023   Procedure: CLOSED REDUCTION HIP;  Surgeon: Lorelle Hussar, MD;  Location: [facility];  Service: Orthopedics;  Laterality: Left;   HIP CLOSED REDUCTION Left 05/17/2023   Procedure: CLOSED REDUCTION HIP;  Surgeon: Edie Norleen PARAS, MD;  Location: [facility];  Service: Orthopedics;  Laterality: Left;   HIP CLOSED REDUCTION Left 03/07/2024   Procedure: CLOSED REDUCTION, HIP;  Surgeon: Tobie Priest, MD;  Location: [facility];  Service: Orthopedics;  Laterality: Left;   HIP SURGERY     JOINT REPLACEMENT     bilateral hip   OOPHORECTOMY     TOTAL HIP ARTHROPLASTY WITH HARDWARE REMOVAL Left 05/30/2024   Procedure: REVISION, ARTHROPLASTY, HIP;  Surgeon: Hue Lynwood SHAUNNA, MD;  Location: [facility];  Service: Orthopedics;  Laterality: Left;   TOTAL KNEE ARTHROPLASTY Bilateral     Social History   Socioeconomic History   Marital status: Widowed    Spouse name: Not on file   Number of children: Not on file   Years of education: Not on file   Highest education level: Not on file  Occupational History   Not on file  Tobacco Use   Smoking status: Never    Passive exposure: Never   Smokeless tobacco: Never  Vaping Use   Vaping  status: Never Used  Substance and Sexual Activity   Alcohol use: No    Alcohol/week: 0.0 standard drinks of alcohol   Drug use: No   Sexual activity: Not Currently  Other Topics Concern   Not on file  Social History Narrative   Not on file   Social Drivers of Health   Financial Resource Strain: Low Risk  (07/17/2024)   [facility]   Overall Financial Resource Strain (CARDIA)    Difficulty of Paying Living Expenses: Not very hard  Food Insecurity: No Food Insecurity (07/17/2024)   Received from [facility]   Hunger Vital Sign    Within the past 12 months, you worried that your food would run out before you got the money to buy more.: Never true    Within the past 12 months, the food you bought just didn't last and you didn't have money to get more.: Never true  Transportation Needs: No Transportation Needs (07/17/2024)  Received from [facility] - Transportation    In the past 12 months, has lack of transportation kept you from medical appointments or from getting medications?: No    Lack of Transportation (Non-Medical): No  Physical Activity: Not on file  Stress: Not on file  Social Connections: Unknown (03/07/2024)   Social Connection and Isolation Panel    Frequency of Communication with Friends and Family: More than three times a week    Frequency of Social Gatherings with Friends and Family: Once a week    Attends Religious Services: 1 to 4 times per year    Active Member of [employer] or Organizations: No    Attends Banker Meetings: Not on file    Marital Status: Not on file  Intimate Partner Violence: Not At Risk (05/30/2024)   Humiliation, Afraid, Rape, and Kick questionnaire    Fear of Current or Ex-Partner: No    Emotionally Abused: No    Physically Abused: No    Sexually Abused: No    Family History  Problem Relation Age of Onset   Heart disease Mother    Arthritis Mother     Heart disease Father    Ulcers Father    Breast cancer Maternal Aunt     Allergies  Allergen Reactions   Streptomycin Hives   Digoxin Other (See Comments)    Other reaction(s): Unknown Note: pt had lethargy, felt bad Note: pt had lethargy, felt bad  Other reaction(s): Unknown Note: pt had lethargy, felt bad  Other reaction(s): Unknown Note: pt had lethargy, felt bad Note: pt had lethargy, felt bad   Trospium Nausea And Vomiting   Citric Acid Other (See Comments)    Unknown reaction per patient   Iodinated Contrast Media Hives    Outpatient Medications Prior to Visit  Medication Sig   acetaminophen  (TYLENOL ) 500 MG tablet Take 500 mg by mouth every 8 (eight) hours as needed (headache (Max 4000 mg/day)).   Alpha-Lipoic Acid 600 MG TABS Take 600 mg by mouth in the morning.   aspirin  81 MG chewable tablet Chew 1 tablet (81 mg total) by mouth 2 (two) times daily.   Calcium  Carbonate-Vitamin D  600-200 MG-UNIT TABS Take 1 tablet by mouth 2 (two) times daily.   carbidopa -levodopa  (SINEMET  CR) 50-200 MG per tablet Take 1 tablet by mouth 3 (three) times daily.   celecoxib  (CELEBREX ) 200 MG capsule Take 1 capsule (200 mg total) by mouth 2 (two) times daily.   cyanocobalamin  (VITAMIN B12) 1000 MCG tablet Take 1,000 mcg by mouth daily.   gabapentin  (NEURONTIN ) 300 MG capsule Take 300 mg by mouth 3 (three) times daily.   latanoprost  (XALATAN ) 0.005 % ophthalmic solution Place 1 drop into both eyes at bedtime.   liver oil-zinc  oxide (DESITIN) 40 % ointment Apply 1 Application topically as needed for irritation.   ondansetron  (ZOFRAN ) 4 MG tablet Take 1 tablet (4 mg total) by mouth every 6 (six) hours as needed for nausea.   oxyCODONE  (OXY IR/ROXICODONE ) 5 MG immediate release tablet Take 1 tablet (5 mg total) by mouth every 4 (four) hours as needed for moderate pain (pain score 4-6) (pain score 4-6).   sertraline  (ZOLOFT ) 100 MG tablet Take 100 mg by mouth in the morning.   traMADol  (ULTRAM )  50 MG tablet Take 1-2 tablets (50-100 mg total) by mouth every 4 (four) hours as needed for moderate pain (pain score 4-6).   trimethoprim  (TRIMPEX ) 100 MG tablet Take 1 tablet (100  mg total) by mouth daily.   chlorhexidine  (HIBICLENS ) 4 % external liquid Apply 15 mLs (1 Application total) topically as directed for 30 doses. Use as directed daily for 5 days every other week for 6 weeks. (Patient not taking: Reported on 08/20/2024)   methylPREDNISolone (MEDROL DOSEPAK) 4 MG TBPK tablet To use as directed (Patient not taking: Reported on 08/20/2024)   pantoprazole  (PROTONIX ) 40 MG tablet Take 1 tablet (40 mg total) by mouth 2 (two) times daily before a meal.   No facility-administered medications prior to visit.    Review of Systems  Constitutional: Negative.  Negative for chills, fever and malaise/fatigue.  HENT:  Positive for sore throat (hoarseness). Negative for congestion.   Eyes: Negative.  Negative for blurred vision and pain.  Respiratory: Negative.  Negative for cough and shortness of breath.   Cardiovascular:  Positive for leg swelling (wearing compression stockings). Negative for chest pain and palpitations.  Gastrointestinal: Negative.  Negative for abdominal pain, blood in stool, constipation, diarrhea, heartburn, melena, nausea and vomiting.  Genitourinary: Negative.  Negative for dysuria, flank pain, frequency and urgency.  Musculoskeletal: Negative.  Negative for joint pain and myalgias.  Skin:        Pressure ulcer on right buttocks  Neurological:  Positive for tingling and tremors. Negative for dizziness, sensory change, weakness and headaches.  Endo/Heme/Allergies: Negative.   Psychiatric/Behavioral: Negative.  Negative for depression and suicidal ideas. The patient is not nervous/anxious.        Objective:   BP 124/70   Pulse 88   Ht 5' 3.5 (1.613 m)   SpO2 97%   BMI 31.73 kg/m   Vitals:   08/20/24 1408  BP: 124/70  Pulse: 88  Height: 5' 3.5 (1.613 m)   Weight: Comment: patient unable to weigh  SpO2: 97%    Physical Exam Vitals and nursing note reviewed.  Constitutional:      Appearance: Normal appearance.  HENT:     Head: Normocephalic and atraumatic.     Nose: Nose normal.     Mouth/Throat:     Mouth: Mucous membranes are moist.     Pharynx: Oropharynx is clear.  Eyes:     Conjunctiva/sclera: Conjunctivae normal.     Pupils: Pupils are equal, round, and reactive to light.  Cardiovascular:     Rate and Rhythm: Normal rate and regular rhythm.     Pulses: Normal pulses.     Heart sounds: Normal heart sounds. No murmur heard. Pulmonary:     Effort: Pulmonary effort is normal.     Breath sounds: Normal breath sounds. No wheezing.  Abdominal:     General: Bowel sounds are normal.     Palpations: Abdomen is soft.     Tenderness: There is no abdominal tenderness. There is no right CVA tenderness or left CVA tenderness.  Musculoskeletal:        General: Normal range of motion.     Cervical back: Normal range of motion.     Right lower leg: Edema present.     Left lower leg: Edema present.  Skin:    General: Skin is warm and dry.     Findings: Wound present.     Comments: Right buttocks/ sacral area oblong shaped skin breakdown that is erythema around borders. No bleeding or signs of infection.  Neurological:     General: No focal deficit present.     Mental Status: She is alert. She is disoriented.     Motor: Weakness (BLE) present.  Gait: Gait abnormal.  Psychiatric:        Mood and Affect: Mood normal.        Behavior: Behavior normal.      No results found for any visits on 08/20/24.  No results found for this or any previous visit (from the past 2160 hours).    Assessment & Plan:  Start lasix  20 mg daily. Start 123 paste q8h hourse for skin berakdown. Daily sacrum pressure patch. Recommended wedge or donut to alternate pressure points while sitting in wheelchair throughout the day. ENT referral sent for  chronic laryngitis and HOH. DNR paperwork filled out today. Updated assisted living paperwork as requested. Flu shot given. Problem List Items Addressed This Visit     Lower extremity edema   Relevant Medications   furosemide  (LASIX ) 20 MG tablet   Hypertension - Primary   Relevant Medications   furosemide  (LASIX ) 20 MG tablet   Pressure injury of left buttock, stage 1   Relevant Medications   aluminum-petrolatum-zinc  (1-2-3 PASTE) 0.027-13.7-10% PSTE paste   Wound Dressings (ALLEVYN GENTLE BORDER SACRUM) PADS   Dementia due to Parkinson's disease without behavioral disturbance (HCC)   S/p revision of right total hip   Bilateral exudative age-related macular degeneration, unspecified stage (HCC)   Stage 3b chronic kidney disease (HCC)   Laryngitis   Relevant Orders   Ambulatory referral to ENT   HOH (hard of hearing)   Relevant Orders   Ambulatory referral to ENT   Needs flu shot   Relevant Orders   Flu Vaccine Trivalent High Dose (Fluad) (Completed)    Return in about 2 weeks (around 09/03/2024).   Total time spent: 30 minutes. This time includes review of previous notes and results and patient face to face interaction during today's visit.    FERNAND FREDY RAMAN, MD  08/20/2024   This document may have been prepared by [facility] Voice Recognition software and as such may include unintentional dictation errors.

## 2024-08-24 ENCOUNTER — Emergency Department
Admission: EM | Admit: 2024-08-24 | Discharge: 2024-08-24 | Disposition: A | Discharge status: Home / Self Care | Attending: Emergency Medicine | Admitting: Emergency Medicine

## 2024-08-24 ENCOUNTER — Other Ambulatory Visit: Payer: Self-pay

## 2024-08-24 ENCOUNTER — Emergency Department

## 2024-08-24 ENCOUNTER — Telehealth: Payer: Self-pay | Admitting: Internal Medicine

## 2024-08-24 DIAGNOSIS — R531 Weakness: Secondary | ICD-10-CM

## 2024-08-24 DIAGNOSIS — R262 Difficulty in walking, not elsewhere classified: Secondary | ICD-10-CM | POA: Insufficient documentation

## 2024-08-24 DIAGNOSIS — N3000 Acute cystitis without hematuria: Secondary | ICD-10-CM | POA: Insufficient documentation

## 2024-08-24 DIAGNOSIS — R251 Tremor, unspecified: Secondary | ICD-10-CM | POA: Insufficient documentation

## 2024-08-24 DIAGNOSIS — K449 Diaphragmatic hernia without obstruction or gangrene: Secondary | ICD-10-CM | POA: Insufficient documentation

## 2024-08-24 DIAGNOSIS — E875 Hyperkalemia: Secondary | ICD-10-CM | POA: Insufficient documentation

## 2024-08-24 LAB — RESP PANEL BY RT-PCR (RSV, FLU A&B, COVID)  RVPGX2
Influenza A by PCR: NEGATIVE
Influenza B by PCR: NEGATIVE
Resp Syncytial Virus by PCR: NEGATIVE
SARS Coronavirus 2 by RT PCR: NEGATIVE

## 2024-08-24 LAB — URINALYSIS, ROUTINE W REFLEX MICROSCOPIC
Bilirubin Urine: NEGATIVE
Glucose, UA: NEGATIVE mg/dL
Ketones, ur: NEGATIVE mg/dL
Nitrite: POSITIVE — AB
Protein, ur: NEGATIVE mg/dL
Specific Gravity, Urine: 1.008 (ref 1.005–1.030)
pH: 7 (ref 5.0–8.0)

## 2024-08-24 LAB — CBC WITH DIFFERENTIAL/PLATELET
Abs Immature Granulocytes: 0.04 K/uL (ref 0.00–0.07)
Basophils Absolute: 0.1 K/uL (ref 0.0–0.1)
Basophils Relative: 1 %
Eosinophils Absolute: 0.3 K/uL (ref 0.0–0.5)
Eosinophils Relative: 5 %
HCT: 40.3 % (ref 36.0–46.0)
Hemoglobin: 12.3 g/dL (ref 12.0–15.0)
Immature Granulocytes: 1 %
Lymphocytes Relative: 23 %
Lymphs Abs: 1.4 K/uL (ref 0.7–4.0)
MCH: 30 pg (ref 26.0–34.0)
MCHC: 30.5 g/dL (ref 30.0–36.0)
MCV: 98.3 fL (ref 80.0–100.0)
Monocytes Absolute: 0.4 K/uL (ref 0.1–1.0)
Monocytes Relative: 6 %
Neutro Abs: 4.1 K/uL (ref 1.7–7.7)
Neutrophils Relative %: 64 %
Platelets: 319 K/uL (ref 150–400)
RBC: 4.1 MIL/uL (ref 3.87–5.11)
RDW: 14.2 % (ref 11.5–15.5)
WBC: 6.3 K/uL (ref 4.0–10.5)
nRBC: 0 % (ref 0.0–0.2)

## 2024-08-24 LAB — COMPREHENSIVE METABOLIC PANEL WITH GFR
ALT: <5 U/L (ref 0–44)
AST: 14 U/L — ABNORMAL LOW (ref 15–41)
Albumin: 3.7 g/dL (ref 3.5–5.0)
Alkaline Phosphatase: 75 U/L (ref 38–126)
Anion gap: 11 (ref 5–15)
BUN: 28 mg/dL — ABNORMAL HIGH (ref 8–23)
CO2: 24 mmol/L (ref 22–32)
Calcium: 9.1 mg/dL (ref 8.9–10.3)
Chloride: 105 mmol/L (ref 98–111)
Creatinine, Ser: 1.29 mg/dL — ABNORMAL HIGH (ref 0.44–1.00)
GFR, Estimated: 40 mL/min — ABNORMAL LOW (ref >60)
Glucose, Bld: 93 mg/dL (ref 70–99)
Potassium: 5.8 mmol/L — ABNORMAL HIGH (ref 3.5–5.1)
Sodium: 140 mmol/L (ref 135–145)
Total Bilirubin: 0.5 mg/dL (ref 0.0–1.2)
Total Protein: 7.2 g/dL (ref 6.5–8.1)

## 2024-08-24 LAB — BASIC METABOLIC PANEL WITH GFR
Anion gap: 11 (ref 5–15)
BUN: 26 mg/dL — ABNORMAL HIGH (ref 8–23)
CO2: 25 mmol/L (ref 22–32)
Calcium: 8.9 mg/dL (ref 8.9–10.3)
Chloride: 102 mmol/L (ref 98–111)
Creatinine, Ser: 1.33 mg/dL — ABNORMAL HIGH (ref 0.44–1.00)
GFR, Estimated: 39 mL/min — ABNORMAL LOW (ref >60)
Glucose, Bld: 87 mg/dL (ref 70–99)
Potassium: 5.6 mmol/L — ABNORMAL HIGH (ref 3.5–5.1)
Sodium: 138 mmol/L (ref 135–145)

## 2024-08-24 LAB — TROPONIN I (HIGH SENSITIVITY)
Troponin I (High Sensitivity): 7 ng/L (ref <18)
Troponin I (High Sensitivity): 7 ng/L (ref <18)

## 2024-08-24 LAB — MAGNESIUM: Magnesium: 2.2 mg/dL (ref 1.7–2.4)

## 2024-08-24 MED ORDER — ACETAMINOPHEN 500 MG PO TABS
1000.0000 mg | ORAL_TABLET | Freq: Once | ORAL | Status: DC
  Filled 2024-08-24: qty 2

## 2024-08-24 MED ORDER — LACTATED RINGERS IV BOLUS
1000.0000 mL | Freq: Once | INTRAVENOUS | Status: AC
  Administered 2024-08-24: 1000 mL via INTRAVENOUS

## 2024-08-24 MED ORDER — VELTASSA 8.4 G PO PACK: 8.4000 g | PACK | Freq: Every day | ORAL | 0 refills | Status: AC

## 2024-08-24 MED ORDER — CEFUROXIME AXETIL 250 MG PO TABS: 250.0000 mg | ORAL_TABLET | Freq: Two times a day (BID) | ORAL | 0 refills | Status: AC

## 2024-08-24 MED ORDER — CEFUROXIME AXETIL 250 MG PO TABS
250.0000 mg | ORAL_TABLET | Freq: Once | ORAL | Status: AC
  Administered 2024-08-24: 250 mg via ORAL
  Filled 2024-08-24: qty 1

## 2024-08-24 MED ORDER — FUROSEMIDE 10 MG/ML IJ SOLN
40.0000 mg | Freq: Once | INTRAMUSCULAR | Status: AC
  Administered 2024-08-24: 40 mg via INTRAVENOUS
  Filled 2024-08-24: qty 4

## 2024-08-24 MED ORDER — SODIUM ZIRCONIUM CYCLOSILICATE 10 G PO PACK
10.0000 g | PACK | Freq: Once | ORAL | Status: AC
  Administered 2024-08-24: 10 g via ORAL
  Filled 2024-08-24: qty 1

## 2024-08-24 NOTE — ED Provider Notes (Signed)
 [facility] Provider Note    Event Date/Time   First MD Initiated Contact with Patient 08/24/24 [phone]     (approximate)   History   Weakness   HPI  Yvonne Singleton is a 89 y.o. female who presents to the ED for evaluation of Weakness   Review a neurology clinic visit from 9/23.  History of Parkinson's disease, lower extremity neuropathy.  Resides at a local SNF.  Patient presents to the ED, accompanied by her son, for evaluation of 1 week of progressively worsening tremors and lower extremity weakness.  She reports bilateral lower extremity weakness such that she has significant difficulty walking.  At baseline she is able to transfer and perform very short walks assisted with a rolling walker.  This past week she has been able to get up and walk at her baseline, seems to be worsening, alongside progressive tremors.  No fevers, emesis or stool changes.  No new pain or additional focal concerns   Physical Exam   Triage Vital Signs: ED Triage Vitals  Encounter Vitals Group     BP 08/24/24 0903 (!) 186/66     Girls Systolic BP Percentile --      Girls Diastolic BP Percentile --      Boys Systolic BP Percentile --      Boys Diastolic BP Percentile --      Pulse Rate 08/24/24 0903 (!) 58     Resp 08/24/24 0903 18     Temp 08/24/24 0903 98.3 F (36.8 C)     Temp Source 08/24/24 0903 Oral     SpO2 08/24/24 0859 97 %     Weight 08/24/24 0904 177 lb 14.6 oz (80.7 kg)     Height 08/24/24 0904 5' 3.5 (1.613 m)     Head Circumference --      Peak Flow --      Pain Score 08/24/24 0904 0     Pain Loc --      Pain Education --      Exclude from Growth Chart --     Most recent vital signs: Vitals:   08/24/24 1200 08/24/24 1230  BP: (!) 146/65 (!) 151/57  Pulse: (!) 52 (!) 51  Resp: 18 18  Temp:    SpO2: 95% 96%    General: Awake, no distress.  CV:  Good peripheral perfusion.  Resp:  Normal effort.  Abd:  No distention.  MSK:  No  deformity noted.  Neuro:  No focal deficits appreciated. Other:     ED Results / Procedures / Treatments   Labs (all labs ordered are listed, but only abnormal results are displayed) Labs Reviewed  COMPREHENSIVE METABOLIC PANEL WITH GFR - Abnormal; Notable for the following components:      Result Value   Potassium 5.8 (*)    BUN 28 (*)    Creatinine, Ser 1.29 (*)    AST 14 (*)    GFR, Estimated 40 (*)    All other components within normal limits  URINALYSIS, ROUTINE W REFLEX MICROSCOPIC - Abnormal; Notable for the following components:   Color, Urine YELLOW (*)    APPearance HAZY (*)    Hgb urine dipstick SMALL (*)    Nitrite POSITIVE (*)    Leukocytes,Ua TRACE (*)    Bacteria, UA MANY (*)    All other components within normal limits  BASIC METABOLIC PANEL WITH GFR - Abnormal; Notable for the following components:   Potassium 5.6 (*)  BUN 26 (*)    Creatinine, Ser 1.33 (*)    GFR, Estimated 39 (*)    All other components within normal limits  RESP PANEL BY RT-PCR (RSV, FLU A&B, COVID)  [id number]  URINE CULTURE  CBC WITH DIFFERENTIAL/PLATELET  MAGNESIUM   TROPONIN I (HIGH SENSITIVITY)  TROPONIN I (HIGH SENSITIVITY)    EKG Sinus rhythm with a rate of 55 bpm.  Normal axis and intervals without clear signs of acute ischemia.  RADIOLOGY CT head interpreted by me without evidence of acute intracranial pathology CXR interpreted by me with possible left basilar infiltrate CT chest interpreted by me with hiatal hernia without pulmonary infiltration  Official radiology report(s): CT Chest Wo Contrast Result Date: 08/24/2024 EXAM: CT CHEST WITHOUT CONTRAST 08/24/2024 11:23:05 AM TECHNIQUE: CT of the chest was performed without the administration of intravenous contrast. Multiplanar reformatted images are provided for review. Automated exposure control, iterative reconstruction, and/or weight based adjustment of the mA/kV was utilized to reduce the radiation dose to as low as  reasonably achievable. COMPARISON: 09/19/2018 CLINICAL HISTORY: eval infiltrate retrocardiac. acutely weak, hyperkalemic FINDINGS: MEDIASTINUM: Aortic atherosclerosis. Large hiatal hernia is noted with almost entire stomach in the thoracic space. Mild coronary artery calcifications are noted. The central airways are clear. LYMPH NODES: No mediastinal, hilar or axillary lymphadenopathy. LUNGS AND PLEURA: Minimal left basilar subsegmental atelectasis is noted. No focal consolidation or pulmonary edema. No pleural effusion or pneumothorax. SOFT TISSUES/BONES: No acute abnormality of the bones or soft tissues. UPPER ABDOMEN: Limited images of the upper abdomen demonstrates no acute abnormality. IMPRESSION: 1. No acute findings. 2. Large hiatal hernia with near-complete intrathoracic stomach configuration. Electronically signed by: Lynwood Seip MD 08/24/2024 12:28 PM EST RP Workstation: [id number]   CT HEAD WO CONTRAST ( ) Result Date: 08/24/2024 EXAM: CT HEAD WITHOUT CONTRAST 08/24/2024 11:23:05 AM TECHNIQUE: CT of the head was performed without the administration of intravenous contrast. Automated exposure control, iterative reconstruction, and/or weight based adjustment of the mA/kV was utilized to reduce the radiation dose to as low as reasonably achievable. COMPARISON: MRI brain 06/30/2022 and CT head 06/29/2022. CLINICAL HISTORY: new weakness and ambulatory dysfunction FINDINGS: BRAIN AND VENTRICLES: No acute hemorrhage. No evidence of acute infarct. No hydrocephalus. No extra-axial collection. No mass effect or midline shift. Mild periventricular and deep white matter hypodensity typical of chronic small vessel ischemia. Mild cerebral volume loss. Atherosclerosis of skullbase vasculature. ORBITS: Bilateral cataract resection. SINUSES: No acute abnormality. SOFT TISSUES AND SKULL: No acute soft tissue abnormality. No skull fracture. IMPRESSION: 1. No acute intracranial abnormality. 2. Mild chronic small vessel  ischemic changes. 3. Mild cerebral volume loss. Electronically signed by: Donnice Mania MD 08/24/2024 11:39 AM EST RP Workstation: [id number]   DG Chest Portable 1 View Result Date: 08/24/2024 EXAM: 1 VIEW(S) XRAY OF THE CHEST 08/24/2024 09:58:00 AM COMPARISON: 01/17/2023 CLINICAL HISTORY: generalized weakness FINDINGS: LUNGS AND PLEURA: Retrocardiac opacity. No pulmonary edema. No pleural effusion. No pneumothorax. HEART AND MEDIASTINUM: Cardiomegaly. Aortic atherosclerosis. BONES AND SOFT TISSUES: Bilateral shoulder degenerative changes. No acute osseous abnormality. IMPRESSION: 1. Retrocardiac opacity, which may reflect atelectasis versus infection. Consider short-interval radiographic follow-up to ensure resolution if clinically warranted. 2. Cardiomegaly. Electronically signed by: Katheleen Faes MD 08/24/2024 10:28 AM EST RP Workstation: [id number]    PROCEDURES and INTERVENTIONS:  .1-3 Lead EKG Interpretation  Performed by: Claudene Rover, MD Authorized by: Claudene Rover, MD     Interpretation: normal     ECG rate:  56   ECG rate assessment: normal  Rhythm: sinus bradycardia     Ectopy: none     Conduction: normal   .Critical Care  Performed by: Claudene Rover, MD Authorized by: Claudene Rover, MD   Critical care provider statement:    Critical care time (minutes):  30   Critical care time was exclusive of:  Separately billable procedures and treating other patients   Critical care was necessary to treat or prevent imminent or life-threatening deterioration of the following conditions:  Metabolic crisis   Critical care was time spent personally by me on the following activities:  Development of treatment plan with patient or surrogate, discussions with consultants, evaluation of patient's response to treatment, examination of patient, ordering and review of laboratory studies, ordering and review of radiographic studies, ordering and performing treatments and interventions, pulse oximetry,  re-evaluation of patient's condition and review of old charts   Medications  acetaminophen  (TYLENOL ) tablet 1,000 mg (1,000 mg Oral Patient Refused/Not Given 08/24/24 0956)  lactated ringers  bolus 1,000 mL (0 mLs Intravenous Stopped 08/24/24 1131)  sodium zirconium cyclosilicate (LOKELMA) packet 10 g (10 g Oral Given 08/24/24 1039)  furosemide  (LASIX ) injection 40 mg (40 mg Intravenous Given 08/24/24 1030)  cefUROXime  (CEFTIN ) tablet 250 mg (250 mg Oral Given 08/24/24 1309)     IMPRESSION / MDM / ASSESSMENT AND PLAN / ED COURSE  I reviewed the triage vital signs and the nursing notes.  Differential diagnosis includes, but is not limited to, symptomatic anemia, dehydration, sepsis, UTI, viral syndrome, AKI  {Patient presents with symptoms of an acute illness or injury that is potentially life-threatening.  Patient with parkinsonism presents with acute on chronic weakness.  Signs of mild hyperkalemia and possible UTI on workup.  She is no focal symptoms and no urinary symptoms, urine is sent for culture and after discussion with her she is agreeable with empiric antibiotics pending this culture.  Metabolic panel with renal function at baseline and mild hyperkalemia at 5.8.  Provide Lokelma and Lasix  with improvement on repeat metabolic panel.  I considered admission for this patient and discussed this with her but she declines preferring to go back to her assisted living facility, she has capacity and son is in agreement.    Discharged with Veltassa  for a few days as well as antibiotics for her urine.  Discussed close return precautions.  Clinical Course as of 08/24/24 1338  Fri Aug 24, 2024  1257 Reassessed and discussed workup in detail.  She does not have any urinary symptoms but we discussed the possibility of UTI based off of her UA, pending culture and she is agreeable with empiric antibiotics.  She is eager to go back to her facility and son is in agreement with this.  We discussed  repeat BMP in the meantime to reevaluate her potassium.  They are agreeable.  She is appreciative. [DS]    Clinical Course User Index [DS] Claudene Rover, MD     FINAL CLINICAL IMPRESSION(S) / ED DIAGNOSES   Final diagnoses:  Generalized weakness  Hyperkalemia  Acute cystitis without hematuria     Rx / DC Orders   ED Discharge Orders          Ordered    cefUROXime  (CEFTIN ) 250 MG tablet  2 times daily with meals        08/24/24 1335    patiromer  (VELTASSA ) 8.4 g packet  Daily        08/24/24 1335             Note:  This document was prepared using Dragon voice recognition software and may include unintentional dictation errors.   Claudene Rover, MD 08/24/24 [phone]

## 2024-08-24 NOTE — ED Notes (Signed)
 Pt provided pericare at this time. Approximately 1 pressure injury on pt's R buttock present with clean dressing in place. Pt adjusted in bed for comfort.

## 2024-08-24 NOTE — ED Triage Notes (Signed)
 Pt arrived from [facility] via [facility] d/t worsening tremors and weakness. Pt has hx of Parkinson's. Per EMS, pt woke up with more weakness than usual and had a difficult time walking to the bathroom. Pt is AO x4. Pt denies pain at this time.

## 2024-08-24 NOTE — Discharge Instructions (Signed)
 Mildly elevated potassium up to 5.8, improving to 5.6 before discharge.  Signs of UTI on urinalysis, pending urine culture  She is being discharged with 2 prescriptions: Ceftin  antibiotics twice daily for 5 days to treat UTI Veltassa  once daily for 3 days to treat mildly elevated potassium  Please recheck your potassium level within the next week  Return to the ED with any worsening symptoms or other concerns

## 2024-08-24 NOTE — Telephone Encounter (Signed)
 Brittany from [facility] called to inform us  that they called EMS to transport patient to ED per patient's request for weakness and worsening tremors with her Parkinson's. Just FYI.

## 2024-08-27 LAB — URINE CULTURE: Culture: >=100000 — AB

## 2024-09-03 ENCOUNTER — Other Ambulatory Visit

## 2024-09-03 ENCOUNTER — Ambulatory Visit (INDEPENDENT_AMBULATORY_CARE_PROVIDER_SITE_OTHER): Admitting: Internal Medicine

## 2024-09-03 ENCOUNTER — Encounter: Payer: Self-pay | Admitting: Internal Medicine

## 2024-09-03 ENCOUNTER — Ambulatory Visit: Payer: Self-pay | Admitting: Internal Medicine

## 2024-09-03 VITALS — BP 112/68 | HR 64 | Ht 63.5 in

## 2024-09-03 DIAGNOSIS — G20A1 Parkinson's disease without dyskinesia, without mention of fluctuations: Secondary | ICD-10-CM

## 2024-09-03 DIAGNOSIS — I10 Essential (primary) hypertension: Secondary | ICD-10-CM | POA: Diagnosis not present

## 2024-09-03 DIAGNOSIS — R300 Dysuria: Secondary | ICD-10-CM

## 2024-09-03 DIAGNOSIS — E782 Mixed hyperlipidemia: Secondary | ICD-10-CM

## 2024-09-03 DIAGNOSIS — E875 Hyperkalemia: Secondary | ICD-10-CM | POA: Diagnosis not present

## 2024-09-03 DIAGNOSIS — L89152 Pressure ulcer of sacral region, stage 2: Secondary | ICD-10-CM

## 2024-09-03 DIAGNOSIS — N1832 Chronic kidney disease, stage 3b: Secondary | ICD-10-CM

## 2024-09-03 LAB — POCT URINALYSIS DIPSTICK
Bilirubin, UA: NEGATIVE
Blood, UA: NEGATIVE
Glucose, UA: NEGATIVE
Ketones, UA: NEGATIVE
Nitrite, UA: POSITIVE
Protein, UA: NEGATIVE
Spec Grav, UA: 1.015 (ref 1.010–1.025)
Urobilinogen, UA: 0.2 U/dL
pH, UA: 5.5 (ref 5.0–8.0)

## 2024-09-03 MED ORDER — CEFPODOXIME PROXETIL 100 MG PO TABS
100.0000 mg | ORAL_TABLET | Freq: Two times a day (BID) | ORAL | 0 refills | Status: AC
Start: 2024-09-03 — End: 2024-09-10

## 2024-09-03 NOTE — Progress Notes (Signed)
 Established Patient Office Visit  Subjective:  Patient ID: Yvonne Singleton, female    DOB: [birth year]  Age: 89 y.o. MRN: [id number]  Chief Complaint  Patient presents with   Follow-up    2 week follow up    Yvonne Singleton went to ED on 08/24/24 for progressive weakness. At that time she was found to be hyperkalemic on lab work and had UTI. They gave her Ceftin  twice a day for 5 days and Valtessa for 3 days. After reviewing her medication list from [facility] it included Bactrim twice day for UTI but it did not contain Ceftin . Patient is not aware of which medication she has been taking and said her [place] medication list is the most accurate.  Urine culture from ED was positive for resistant E. Coli and pseudomonas. Will recheck UA today and send for culture. UA today is positive for Nitrates and trace leukocytes. Will d/c bactrim and start Vantin 100 mg twice a day for 1 week.  Check potassium today to FU on recent elevated Potassium levels after completing Valtessa and patient is also on Lasix  daily.  Skin breakdown on sacral region is increasing in size for but there is less erythema noted today than previously.  She has pressure dressing in place and was reapplied after visualization. Will send wound care consult to prevent further progression of pressure ulcer.  Patient reports neuropathy is getting worse. She endorses BLE burning sensation. Reports it has improved over the last week since she has been tens unit therapy. Neurologist reduced Gabapentin  to max 600 mg day for decreased kidney function and started Warrens cream daily.  She otherwise states she is doing well and has no additional concerns.      No other concerns at this time.   Past Medical History:  Diagnosis Date   Acute metabolic encephalopathy 05/12/2022   Aortic atherosclerosis    Back pain    Breast cancer, right (HCC) 2007   a.) Tx'd with partial mastectomy (lumpectomy) + adjuvant XRT   Cerebral  microvascular disease    CKD (chronic kidney disease) stage 3, GFR 30-59 ml/min (HCC)    Collagen vascular disease    Depression    Diastolic dysfunction    Fatty liver    GERD (gastroesophageal reflux disease)    Glaucoma    Gout    Hard of hearing    Headache 05/16/2022   History of bilateral hip replacements    History of hiatal hernia    Hyperlipidemia    Hypertension    Lives in assisted living facility    [facility]   Macular degeneration    Parkinson's disease [facility])    Peripheral neuropathy    RA (rheumatoid arthritis) (HCC)    Recurrent dislocation of hip    Spinal stenosis of lumbar region    Ventral hernia    Vitamin B 12 deficiency     Past Surgical History:  Procedure Laterality Date   ABDOMINAL HYSTERECTOMY     ANTERIOR HIP REVISION Right 04/08/2022   Procedure: Anterior hip revision cup and femoral head;  Surgeon: Kathlynn Sharper, MD;  Location: [facility] ORS;  Service: Orthopedics;  Laterality: Right;   BREAST LUMPECTOMY Right 2007   suspicious calcs, f/u with radiation   CATARACT EXTRACTION Bilateral    ESOPHAGOGASTRODUODENOSCOPY (EGD) WITH PROPOFOL  N/A 05/25/2017   Procedure: ESOPHAGOGASTRODUODENOSCOPY (EGD) WITH PROPOFOL ;  Surgeon: Gaylyn Gladis PENNER, MD;  Location: [facility] ENDOSCOPY;  Service: Endoscopy;  Laterality: N/A;   ESOPHAGOGASTRODUODENOSCOPY (EGD) WITH PROPOFOL   N/A 05/09/2018   Procedure: ESOPHAGOGASTRODUODENOSCOPY (EGD) WITH PROPOFOL ;  Surgeon: Unk Corinn Skiff, MD;  Location: [facility] ENDOSCOPY;  Service: Gastroenterology;  Laterality: N/A;   ESOPHAGOGASTRODUODENOSCOPY (EGD) WITH PROPOFOL  N/A 12/09/2021   Procedure: ESOPHAGOGASTRODUODENOSCOPY (EGD) WITH PROPOFOL ;  Surgeon: Unk Corinn Skiff, MD;  Location: [facility] ENDOSCOPY;  Service: Gastroenterology;  Laterality: N/A;   EYE SURGERY Bilateral    HAND SURGERY     HIP CLOSED REDUCTION Left 06/24/2015   Procedure: CLOSED REDUCTION HIP;  Surgeon: Lynwood SHAUNNA Hue, MD;  Location: [facility] ORS;  Service: Orthopedics;   Laterality: Left;   HIP CLOSED REDUCTION Left 02/09/2016   Procedure: CLOSED MANIPULATION HIP;  Surgeon: Kayla Pinal, MD;  Location: [facility] ORS;  Service: Orthopedics;  Laterality: Left;   HIP CLOSED REDUCTION Left 10/31/2021   Procedure: CLOSED REDUCTION HIP;  Surgeon: Edie Norleen PARAS, MD;  Location: [facility] ORS;  Service: Orthopedics;  Laterality: Left;   HIP CLOSED REDUCTION Left 07/05/2022   Procedure: CLOSED REDUCTION HIP;  Surgeon: Hue Lynwood SHAUNNA, MD;  Location: [facility] ORS;  Service: Orthopedics;  Laterality: Left;   HIP CLOSED REDUCTION Left 01/17/2023   Procedure: CLOSED REDUCTION HIP;  Surgeon: Lorelle Hussar, MD;  Location: [facility] ORS;  Service: Orthopedics;  Laterality: Left;   HIP CLOSED REDUCTION Left 05/17/2023   Procedure: CLOSED REDUCTION HIP;  Surgeon: Edie Norleen PARAS, MD;  Location: [facility] ORS;  Service: Orthopedics;  Laterality: Left;   HIP CLOSED REDUCTION Left 03/07/2024   Procedure: CLOSED REDUCTION, HIP;  Surgeon: Tobie Priest, MD;  Location: [facility] ORS;  Service: Orthopedics;  Laterality: Left;   HIP SURGERY     JOINT REPLACEMENT     bilateral hip   OOPHORECTOMY     TOTAL HIP ARTHROPLASTY WITH HARDWARE REMOVAL Left 05/30/2024   Procedure: REVISION, ARTHROPLASTY, HIP;  Surgeon: Hue Lynwood SHAUNNA, MD;  Location: [facility] ORS;  Service: Orthopedics;  Laterality: Left;   TOTAL KNEE ARTHROPLASTY Bilateral     Social History   Socioeconomic History   Marital status: Widowed    Spouse name: Not on file   Number of children: Not on file   Years of education: Not on file   Highest education level: Not on file  Occupational History   Not on file  Tobacco Use   Smoking status: Never    Passive exposure: Never   Smokeless tobacco: Never  Vaping Use   Vaping status: Never Used  Substance and Sexual Activity   Alcohol use: No    Alcohol/week: 0.0 standard drinks of alcohol   Drug use: No   Sexual activity: Not Currently  Other Topics Concern   Not on file  Social History Narrative   Not  on file   Social Drivers of Health   Financial Resource Strain: Low Risk  (07/17/2024)   Received from [facility]   Overall Financial Resource Strain (CARDIA)    Difficulty of Paying Living Expenses: Not very hard  Food Insecurity: No Food Insecurity (07/17/2024)   Received from [facility]   Hunger Vital Sign    Within the past 12 months, you worried that your food would run out before you got the money to buy more.: Never true    Within the past 12 months, the food you bought just didn't last and you didn't have money to get more.: Never true  Transportation Needs: No Transportation Needs (07/17/2024)   Received from [facility] Transportation    In the past 12 months,  has lack of transportation kept you from medical appointments or from getting medications?: No    Lack of Transportation (Non-Medical): No  Physical Activity: Not on file  Stress: Not on file  Social Connections: Unknown (03/07/2024)   Social Connection and Isolation Panel    Frequency of Communication with Friends and Family: More than three times a week    Frequency of Social Gatherings with Friends and Family: Once a week    Attends Religious Services: 1 to 4 times per year    Active Member of [employer] or Organizations: No    Attends Banker Meetings: Not on file    Marital Status: Not on file  Intimate Partner Violence: Not At Risk (05/30/2024)   Humiliation, Afraid, Rape, and Kick questionnaire    Fear of Current or Ex-Partner: No    Emotionally Abused: No    Physically Abused: No    Sexually Abused: No    Family History  Problem Relation Age of Onset   Heart disease Mother    Arthritis Mother    Heart disease Father    Ulcers Father    Breast cancer Maternal Aunt     Allergies  Allergen Reactions   Streptomycin Hives   Digoxin Other (See Comments)    Other reaction(s): Unknown Note: pt had lethargy, felt bad Note: pt had  lethargy, felt bad  Other reaction(s): Unknown Note: pt had lethargy, felt bad  Other reaction(s): Unknown Note: pt had lethargy, felt bad Note: pt had lethargy, felt bad   Trospium Nausea And Vomiting   Citric Acid Other (See Comments)    Unknown reaction per patient   Iodinated Contrast Media Hives    Outpatient Medications Prior to Visit  Medication Sig   acetaminophen  (TYLENOL ) 500 MG tablet Take 500 mg by mouth every 8 (eight) hours as needed (headache (Max 4000 mg/day)).   Alpha-Lipoic Acid 600 MG TABS Take 600 mg by mouth in the morning.   aluminum-petrolatum-zinc  (1-2-3 PASTE) 0.027-13.7-10% PSTE paste Apply 1 Application topically as needed.   aspirin  81 MG chewable tablet Chew 1 tablet (81 mg total) by mouth 2 (two) times daily.   Calcium  Carbonate-Vitamin D  600-200 MG-UNIT TABS Take 1 tablet by mouth 2 (two) times daily.   carbidopa -levodopa  (SINEMET  CR) 50-200 MG per tablet Take 1 tablet by mouth 3 (three) times daily.   celecoxib  (CELEBREX ) 200 MG capsule Take 1 capsule (200 mg total) by mouth 2 (two) times daily.   cyanocobalamin  (VITAMIN B12) 1000 MCG tablet Take 1,000 mcg by mouth daily.   furosemide  (LASIX ) 20 MG tablet Take 1 tablet (20 mg total) by mouth daily.   gabapentin  (NEURONTIN ) 300 MG capsule Take 300 mg by mouth 3 (three) times daily.   latanoprost  (XALATAN ) 0.005 % ophthalmic solution Place 1 drop into both eyes at bedtime.   liver oil-zinc  oxide (DESITIN) 40 % ointment Apply 1 Application topically as needed for irritation.   ondansetron  (ZOFRAN ) 4 MG tablet Take 1 tablet (4 mg total) by mouth every 6 (six) hours as needed for nausea.   pantoprazole  (PROTONIX ) 40 MG tablet Take 1 tablet (40 mg total) by mouth 2 (two) times daily before a meal.   sertraline  (ZOLOFT ) 100 MG tablet Take 100 mg by mouth in the morning.   trimethoprim  (TRIMPEX ) 100 MG tablet Take 1 tablet (100 mg total) by mouth daily.   Wound Dressings (ALLEVYN GENTLE BORDER SACRUM) PADS Apply  1 sacrum pressure dressing to sacrum area daily and replace as needed  if it becomes soiled with urine or stool.   chlorhexidine  (HIBICLENS ) 4 % external liquid Apply 15 mLs (1 Application total) topically as directed for 30 doses. Use as directed daily for 5 days every other week for 6 weeks. (Patient not taking: Reported on 09/03/2024)   oxyCODONE  (OXY IR/ROXICODONE ) 5 MG immediate release tablet Take 1 tablet (5 mg total) by mouth every 4 (four) hours as needed for moderate pain (pain score 4-6) (pain score 4-6). (Patient not taking: Reported on 09/03/2024)   traMADol  (ULTRAM ) 50 MG tablet Take 1-2 tablets (50-100 mg total) by mouth every 4 (four) hours as needed for moderate pain (pain score 4-6). (Patient not taking: Reported on 09/03/2024)   [DISCONTINUED] methylPREDNISolone (MEDROL DOSEPAK) 4 MG TBPK tablet To use as directed (Patient not taking: Reported on 09/03/2024)   No facility-administered medications prior to visit.    Review of Systems  Constitutional: Negative.  Negative for chills, fever and malaise/fatigue.  HENT: Negative.  Negative for congestion and sore throat.   Eyes: Negative.  Negative for blurred vision and pain.  Respiratory: Negative.  Negative for cough and shortness of breath.   Cardiovascular: Negative.  Negative for chest pain, palpitations and leg swelling.  Gastrointestinal: Negative.  Negative for abdominal pain, blood in stool, constipation, diarrhea, heartburn, melena, nausea and vomiting.  Genitourinary:  Positive for dysuria. Negative for flank pain, frequency and urgency.  Musculoskeletal: Negative.  Negative for joint pain and myalgias.  Skin: Negative.   Neurological:  Positive for sensory change (BLE). Negative for dizziness, tingling, weakness and headaches.  Endo/Heme/Allergies: Negative.   Psychiatric/Behavioral: Negative.  Negative for depression and suicidal ideas. The patient is not nervous/anxious.        Objective:   BP 112/68   Pulse 64    Ht 5' 3.5 (1.613 m)   SpO2 94%   BMI 31.02 kg/m   Vitals:   09/03/24 1105  BP: 112/68  Pulse: 64  Height: 5' 3.5 (1.613 m)  Weight: Comment: unable to weigh  SpO2: 94%    Physical Exam Vitals and nursing note reviewed.  Constitutional:      Appearance: Normal appearance.  HENT:     Head: Normocephalic and atraumatic.     Nose: Nose normal.     Mouth/Throat:     Mouth: Mucous membranes are moist.     Pharynx: Oropharynx is clear.  Eyes:     Conjunctiva/sclera: Conjunctivae normal.     Pupils: Pupils are equal, round, and reactive to light.  Cardiovascular:     Rate and Rhythm: Normal rate and regular rhythm.     Pulses: Normal pulses.     Heart sounds: Normal heart sounds. No murmur heard. Pulmonary:     Effort: Pulmonary effort is normal.     Breath sounds: Normal breath sounds. No wheezing.  Abdominal:     General: Bowel sounds are normal.     Palpations: Abdomen is soft.     Tenderness: There is no abdominal tenderness. There is no right CVA tenderness or left CVA tenderness.  Musculoskeletal:        General: Normal range of motion.     Cervical back: Normal range of motion.     Right lower leg: No edema.     Left lower leg: No edema.  Skin:    General: Skin is warm and dry.     Findings: Erythema and wound present.     Comments: Stage 2 pressure ulcer sacral region  Neurological:  General: No focal deficit present.     Mental Status: She is alert and oriented to person, place, and time.  Psychiatric:        Mood and Affect: Mood normal.        Behavior: Behavior normal.      Results for orders placed or performed in visit on 09/03/24  POCT Urinalysis Dipstick (81002)  Result Value Ref Range   Color, UA Light yellow    Clarity, UA Cloudy    Glucose, UA Negative Negative   Bilirubin, UA Negative    Ketones, UA Negative    Spec Grav, UA 1.015 1.010 - 1.025   Blood, UA Negative    pH, UA 5.5 5.0 - 8.0   Protein, UA Negative Negative    Urobilinogen, UA 0.2 0.2 or 1.0 E.U./dL   Nitrite, UA Positive    Leukocytes, UA Trace (A) Negative   Appearance Cloudy    Odor Yes     Recent Results (from the past 2160 hours)  Resp panel by RT-PCR (RSV, Flu A&B, Covid) Anterior Nasal Swab     Status: None   Collection Time: 08/24/24  9:11 AM   Specimen: Anterior Nasal Swab  Result Value Ref Range   SARS Coronavirus 2 by RT PCR NEGATIVE NEGATIVE    Comment: (NOTE) SARS-CoV-2 target nucleic acids are NOT DETECTED.  The SARS-CoV-2 RNA is generally detectable in upper respiratory specimens during the acute phase of infection. The lowest concentration of SARS-CoV-2 viral copies this assay can detect is 138 copies/mL. A negative result does not preclude SARS-Cov-2 infection and should not be used as the sole basis for treatment or other patient management decisions. A negative result may occur with  improper specimen collection/handling, submission of specimen other than nasopharyngeal swab, presence of viral mutation(s) within the areas targeted by this assay, and inadequate number of viral copies(<138 copies/mL). A negative result must be combined with clinical observations, patient history, and epidemiological information. The expected result is Negative.  Fact Sheet for Patients:  bloggercourse.com  Fact Sheet for Healthcare Providers:  seriousbroker.it  This test is no t yet approved or cleared by the [place]  FDA and  has been authorized for detection and/or diagnosis of SARS-CoV-2 by FDA under an Emergency Use Authorization (EUA). This EUA will remain  in effect (meaning this test can be used) for the duration of the COVID-19 declaration under Section 564(b)(1) of the Act, 21 U.S.C.section 360bbb-3(b)(1), unless the authorization is terminated  or revoked sooner.       Influenza A by PCR NEGATIVE NEGATIVE   Influenza B by PCR NEGATIVE NEGATIVE    Comment:  (NOTE) The Xpert Xpress SARS-CoV-2/FLU/RSV plus assay is intended as an aid in the diagnosis of influenza from Nasopharyngeal swab specimens and should not be used as a sole basis for treatment. Nasal washings and aspirates are unacceptable for Xpert Xpress SARS-CoV-2/FLU/RSV testing.  Fact Sheet for Patients: bloggercourse.com  Fact Sheet for Healthcare Providers: seriousbroker.it  This test is not yet approved or cleared by the [place]  FDA and has been authorized for detection and/or diagnosis of SARS-CoV-2 by FDA under an Emergency Use Authorization (EUA). This EUA will remain in effect (meaning this test can be used) for the duration of the COVID-19 declaration under Section 564(b)(1) of the Act, 21 U.S.C. section 360bbb-3(b)(1), unless the authorization is terminated or revoked.     Resp Syncytial Virus by PCR NEGATIVE NEGATIVE    Comment: (NOTE) Fact Sheet for Patients: bloggercourse.com  Fact  Sheet for Healthcare Providers: seriousbroker.it  This test is not yet approved or cleared by the [place]  FDA and has been authorized for detection and/or diagnosis of SARS-CoV-2 by FDA under an Emergency Use Authorization (EUA). This EUA will remain in effect (meaning this test can be used) for the duration of the COVID-19 declaration under Section 564(b)(1) of the Act, 21 U.S.C. section 360bbb-3(b)(1), unless the authorization is terminated or revoked.  Performed at [facility], [address] [place], [place] [address]   Comprehensive metabolic panel with GFR     Status: Abnormal   Collection Time: 08/24/24  9:11 AM  Result Value Ref Range   Sodium 140 135 - 145 mmol/L   Potassium 5.8 (H) 3.5 - 5.1 mmol/L   Chloride 105 98 - 111 mmol/L   CO2 24 22 - 32 mmol/L   Glucose, Bld 93 70 - 99 mg/dL    Comment: Glucose reference range applies only to samples  taken after fasting for at least 8 hours.   BUN 28 (H) 8 - 23 mg/dL   Creatinine, Ser 8.70 (H) 0.44 - 1.00 mg/dL   Calcium  9.1 8.9 - 10.3 mg/dL   Total Protein 7.2 6.5 - 8.1 g/dL   Albumin 3.7 3.5 - 5.0 g/dL   AST 14 (L) 15 - 41 U/L   ALT <5 0 - 44 U/L   Alkaline Phosphatase 75 38 - 126 U/L   Total Bilirubin 0.5 0.0 - 1.2 mg/dL   GFR, Estimated 40 (L) >60 mL/min    Comment: (NOTE) Calculated using the CKD-EPI Creatinine Equation (2021)    Anion gap 11 5 - 15    Comment: Performed at [facility], [address] [place], [place] [address]  CBC with Differential/Platelet     Status: None   Collection Time: 08/24/24  9:11 AM  Result Value Ref Range   WBC 6.3 4.0 - 10.5 K/uL   RBC 4.10 3.87 - 5.11 MIL/uL   Hemoglobin 12.3 12.0 - 15.0 g/dL   HCT 59.6 63.9 - 53.9 %   MCV 98.3 80.0 - 100.0 fL   MCH 30.0 26.0 - 34.0 pg   MCHC 30.5 30.0 - 36.0 g/dL   RDW 85.7 88.4 - 84.4 %   Platelets 319 150 - 400 K/uL   nRBC 0.0 0.0 - 0.2 %   Neutrophils Relative % 64 %   Neutro Abs 4.1 1.7 - 7.7 K/uL   Lymphocytes Relative 23 %   Lymphs Abs 1.4 0.7 - 4.0 K/uL   Monocytes Relative 6 %   Monocytes Absolute 0.4 0.1 - 1.0 K/uL   Eosinophils Relative 5 %   Eosinophils Absolute 0.3 0.0 - 0.5 K/uL   Basophils Relative 1 %   Basophils Absolute 0.1 0.0 - 0.1 K/uL   Immature Granulocytes 1 %   Abs Immature Granulocytes 0.04 0.00 - 0.07 K/uL    Comment: Performed at [facility], [address] [place], [place] [address]  Troponin I (High Sensitivity)     Status: None   Collection Time: 08/24/24  9:11 AM  Result Value Ref Range   Troponin I (High Sensitivity) 7 <18 ng/L    Comment: (NOTE) Elevated high sensitivity troponin I (hsTnI) values and significant  changes across serial measurements may suggest ACS but many other  chronic and acute conditions are known to elevate hsTnI results.  Refer to the Links section for chest pain algorithms and additional  guidance. Performed  at [facility], [address], [place], [place] [address]   Magnesium      Status: None   Collection Time: 08/24/24  9:11 AM  Result Value Ref Range   Magnesium  2.2 1.7 - 2.4 mg/dL    Comment: Performed at [facility], [address] [place], [place] [address]  Urinalysis, Routine w reflex microscopic -Urine, Clean Catch     Status: Abnormal   Collection Time: 08/24/24  9:50 AM  Result Value Ref Range   Color, Urine YELLOW (A) YELLOW   APPearance HAZY (A) CLEAR   Specific Gravity, Urine 1.008 1.005 - 1.030   pH 7.0 5.0 - 8.0   Glucose, UA NEGATIVE NEGATIVE mg/dL   Hgb urine dipstick SMALL (A) NEGATIVE   Bilirubin Urine NEGATIVE NEGATIVE   Ketones, ur NEGATIVE NEGATIVE mg/dL   Protein, ur NEGATIVE NEGATIVE mg/dL   Nitrite POSITIVE (A) NEGATIVE   Leukocytes,Ua TRACE (A) NEGATIVE   RBC / HPF 0-5 0 - 5 RBC/hpf   WBC, UA 6-10 0 - 5 WBC/hpf   Bacteria, UA MANY (A) NONE SEEN   Squamous Epithelial / HPF 0-5 0 - 5 /HPF   Mucus PRESENT     Comment: Performed at [facility], [address], [place], [place] [address]  Urine Culture     Status: Abnormal   Collection Time: 08/24/24  9:50 AM   Specimen: Urine, Clean Catch  Result Value Ref Range   Specimen Description      URINE, CLEAN CATCH Performed at [facility], [address], [place], [place] [address]    Special Requests      NONE Performed at [facility], [address] [place], [place] [address]    Culture (A)     >=100,000 COLONIES/mL ESCHERICHIA COLI Two isolates with different morphologies were identified as the same organism.The most resistant organism was reported. 50,000 COLONIES/mL PSEUDOMONAS AERUGINOSA    Report Status 08/27/2024 FINAL    Organism ID, Bacteria PSEUDOMONAS AERUGINOSA (A)    Organism ID, Bacteria ESCHERICHIA COLI (A)       Susceptibility   Escherichia coli - MIC*    AMPICILLIN >=32 RESISTANT Resistant     CEFAZOLIN  (URINE) Value in next row  Sensitive      8 SENSITIVEThis is a modified FDA-approved test that has been validated and its performance characteristics determined by the reporting laboratory.  This laboratory is certified under the Clinical Laboratory Improvement Amendments CLIA as qualified to perform high complexity clinical laboratory testing.    CEFEPIME Value in next row Sensitive      8 SENSITIVEThis is a modified FDA-approved test that has been validated and its performance characteristics determined by the reporting laboratory.  This laboratory is certified under the Clinical Laboratory Improvement Amendments CLIA as qualified to perform high complexity clinical laboratory testing.    ERTAPENEM Value in next row Sensitive      8 SENSITIVEThis is a modified FDA-approved test that has been validated and its performance characteristics determined by the reporting laboratory.  This laboratory is certified under the Clinical Laboratory Improvement Amendments CLIA as qualified to perform high complexity clinical laboratory testing.    CEFTRIAXONE  Value in next row Sensitive      8 SENSITIVEThis is a modified FDA-approved test that has been validated and its performance characteristics determined by the reporting laboratory.  This laboratory is certified under the Clinical Laboratory Improvement Amendments CLIA as qualified to perform high complexity clinical laboratory testing.    CIPROFLOXACIN  Value in next row Resistant  8 SENSITIVEThis is a modified FDA-approved test that has been validated and its performance characteristics determined by the reporting laboratory.  This laboratory is certified under the Clinical Laboratory Improvement Amendments CLIA as qualified to perform high complexity clinical laboratory testing.    GENTAMICIN Value in next row Sensitive      8 SENSITIVEThis is a modified FDA-approved test that has been validated and its performance characteristics determined by the reporting laboratory.  This  laboratory is certified under the Clinical Laboratory Improvement Amendments CLIA as qualified to perform high complexity clinical laboratory testing.    NITROFURANTOIN Value in next row Sensitive      8 SENSITIVEThis is a modified FDA-approved test that has been validated and its performance characteristics determined by the reporting laboratory.  This laboratory is certified under the Clinical Laboratory Improvement Amendments CLIA as qualified to perform high complexity clinical laboratory testing.    TRIMETH/SULFA Value in next row Resistant      8 SENSITIVEThis is a modified FDA-approved test that has been validated and its performance characteristics determined by the reporting laboratory.  This laboratory is certified under the Clinical Laboratory Improvement Amendments CLIA as qualified to perform high complexity clinical laboratory testing.    AMPICILLIN/SULBACTAM Value in next row Sensitive      8 SENSITIVEThis is a modified FDA-approved test that has been validated and its performance characteristics determined by the reporting laboratory.  This laboratory is certified under the Clinical Laboratory Improvement Amendments CLIA as qualified to perform high complexity clinical laboratory testing.    PIP/TAZO Value in next row Sensitive      <=4 SENSITIVEThis is a modified FDA-approved test that has been validated and its performance characteristics determined by the reporting laboratory.  This laboratory is certified under the Clinical Laboratory Improvement Amendments CLIA as qualified to perform high complexity clinical laboratory testing.    MEROPENEM Value in next row Sensitive      <=4 SENSITIVEThis is a modified FDA-approved test that has been validated and its performance characteristics determined by the reporting laboratory.  This laboratory is certified under the Clinical Laboratory Improvement Amendments CLIA as qualified to perform high complexity clinical laboratory testing.    *  >=100,000 COLONIES/mL ESCHERICHIA COLI   Pseudomonas aeruginosa - MIC*    MEROPENEM Value in next row Sensitive      <=4 SENSITIVEThis is a modified FDA-approved test that has been validated and its performance characteristics determined by the reporting laboratory.  This laboratory is certified under the Clinical Laboratory Improvement Amendments CLIA as qualified to perform high complexity clinical laboratory testing.    CIPROFLOXACIN  Value in next row Resistant      <=4 SENSITIVEThis is a modified FDA-approved test that has been validated and its performance characteristics determined by the reporting laboratory.  This laboratory is certified under the Clinical Laboratory Improvement Amendments CLIA as qualified to perform high complexity clinical laboratory testing.    IMIPENEM Value in next row Sensitive      <=4 SENSITIVEThis is a modified FDA-approved test that has been validated and its performance characteristics determined by the reporting laboratory.  This laboratory is certified under the Clinical Laboratory Improvement Amendments CLIA as qualified to perform high complexity clinical laboratory testing.    PIP/TAZO Value in next row Sensitive      8 SENSITIVEThis is a modified FDA-approved test that has been validated and its performance characteristics determined by the reporting laboratory.  This laboratory is certified under the Clinical Laboratory Improvement Amendments CLIA as qualified  to perform high complexity clinical laboratory testing.    CEFEPIME Value in next row Sensitive      8 SENSITIVEThis is a modified FDA-approved test that has been validated and its performance characteristics determined by the reporting laboratory.  This laboratory is certified under the Clinical Laboratory Improvement Amendments CLIA as qualified to perform high complexity clinical laboratory testing.    CEFTAZIDIME/AVIBACTAM Value in next row Sensitive      8 SENSITIVEThis is a modified FDA-approved  test that has been validated and its performance characteristics determined by the reporting laboratory.  This laboratory is certified under the Clinical Laboratory Improvement Amendments CLIA as qualified to perform high complexity clinical laboratory testing.    CEFTOLOZANE/TAZOBACTAM Value in next row Sensitive      8 SENSITIVEThis is a modified FDA-approved test that has been validated and its performance characteristics determined by the reporting laboratory.  This laboratory is certified under the Clinical Laboratory Improvement Amendments CLIA as qualified to perform high complexity clinical laboratory testing.    TOBRAMYCIN Value in next row Sensitive      8 SENSITIVEThis is a modified FDA-approved test that has been validated and its performance characteristics determined by the reporting laboratory.  This laboratory is certified under the Clinical Laboratory Improvement Amendments CLIA as qualified to perform high complexity clinical laboratory testing.    * 50,000 COLONIES/mL PSEUDOMONAS AERUGINOSA  Troponin I (High Sensitivity)     Status: None   Collection Time: 08/24/24 11:32 AM  Result Value Ref Range   Troponin I (High Sensitivity) 7 <18 ng/L    Comment: (NOTE) Elevated high sensitivity troponin I (hsTnI) values and significant  changes across serial measurements may suggest ACS but many other  chronic and acute conditions are known to elevate hsTnI results.  Refer to the Links section for chest pain algorithms and additional  guidance. Performed at [facility], [address] [place], [place] [address]   Basic metabolic panel     Status: Abnormal   Collection Time: 08/24/24  1:06 PM  Result Value Ref Range   Sodium 138 135 - 145 mmol/L   Potassium 5.6 (H) 3.5 - 5.1 mmol/L   Chloride 102 98 - 111 mmol/L   CO2 25 22 - 32 mmol/L   Glucose, Bld 87 70 - 99 mg/dL    Comment: Glucose reference range applies only to samples taken after fasting for at least 8 hours.    BUN 26 (H) 8 - 23 mg/dL   Creatinine, Ser 8.66 (H) 0.44 - 1.00 mg/dL   Calcium  8.9 8.9 - 10.3 mg/dL   GFR, Estimated 39 (L) >60 mL/min    Comment: (NOTE) Calculated using the CKD-EPI Creatinine Equation (2021)    Anion gap 11 5 - 15    Comment: Performed at [facility], [address] [place], [place] [address]  POCT Urinalysis Dipstick [id number])     Status: Abnormal   Collection Time: 09/03/24 11:48 AM  Result Value Ref Range   Color, UA Light yellow    Clarity, UA Cloudy    Glucose, UA Negative Negative   Bilirubin, UA Negative    Ketones, UA Negative    Spec Grav, UA 1.015 1.010 - 1.025   Blood, UA Negative    pH, UA 5.5 5.0 - 8.0   Protein, UA Negative Negative   Urobilinogen, UA 0.2 0.2 or 1.0 E.U./dL   Nitrite, UA Positive    Leukocytes, UA Trace (A) Negative   Appearance Cloudy  Odor Yes       Assessment & Plan:  Stop Bactrim and start Vantin twice day for 1 week. UA and urine culture today. Continue other medications as prescribed. Check BMP today. Wound care consult ordered for sacral pressure ulcer. Problem List Items Addressed This Visit     Hyperlipidemia   Parkinson disease (HCC)   Hypertension   Hyperkalemia - Primary   Relevant Orders   Basic metabolic panel with GFR   Stage 3b chronic kidney disease (HCC)   Other Visit Diagnoses       Dysuria       Relevant Medications   cefpodoxime (VANTIN) 100 MG tablet   Other Relevant Orders   POCT Urinalysis Dipstick (18997) (Completed)   Urine Culture     Pressure injury of sacral region, stage 2 (HCC)       Relevant Orders   Consult to wound, ostomy, continence       Return in about 2 weeks (around 09/17/2024).   Total time spent: 25 minutes. This time includes review of previous notes and results and patient face to face interaction during today's visit.    FERNAND FREDY RAMAN, MD  09/03/2024   This document may have been prepared by [facility] Voice Recognition software and as such may  include unintentional dictation errors.

## 2024-09-04 LAB — BASIC METABOLIC PANEL WITH GFR
BUN/Creatinine Ratio: 29 — ABNORMAL HIGH (ref 12–28)
BUN: 40 mg/dL — ABNORMAL HIGH (ref 8–27)
CO2: 22 mmol/L (ref 20–29)
Calcium: 9.9 mg/dL (ref 8.7–10.3)
Chloride: 102 mmol/L (ref 96–106)
Creatinine, Ser: 1.4 mg/dL — ABNORMAL HIGH (ref 0.57–1.00)
Glucose: 87 mg/dL (ref 70–99)
Potassium: 4.7 mmol/L (ref 3.5–5.2)
Sodium: 138 mmol/L (ref 134–144)
eGFR: 36 mL/min/1.73 — ABNORMAL LOW (ref >59)

## 2024-09-09 LAB — URINE CULTURE

## 2024-09-19 ENCOUNTER — Other Ambulatory Visit: Payer: Self-pay | Admitting: Internal Medicine

## 2024-09-20 ENCOUNTER — Other Ambulatory Visit: Payer: Self-pay | Admitting: Internal Medicine

## 2024-09-20 DIAGNOSIS — G629 Polyneuropathy, unspecified: Secondary | ICD-10-CM

## 2024-09-20 DIAGNOSIS — G20A2 Parkinson's disease without dyskinesia, with fluctuations: Secondary | ICD-10-CM

## 2024-09-21 ENCOUNTER — Ambulatory Visit: Admitting: Internal Medicine

## 2024-09-26 ENCOUNTER — Telehealth: Payer: Self-pay | Admitting: Internal Medicine

## 2024-09-26 NOTE — Telephone Encounter (Signed)
 Harlene, PT with [facility], left VM requesting PT 2w2, then 1w5. Verbal orders given to [facility].

## 2024-10-01 ENCOUNTER — Ambulatory Visit: Admitting: Urology

## 2024-10-16 ENCOUNTER — Ambulatory Visit (INDEPENDENT_AMBULATORY_CARE_PROVIDER_SITE_OTHER): Admitting: Internal Medicine

## 2024-10-16 ENCOUNTER — Encounter: Payer: Self-pay | Admitting: Internal Medicine

## 2024-10-16 VITALS — BP 112/68 | HR 64 | Ht 63.5 in

## 2024-10-16 DIAGNOSIS — G20A2 Parkinson's disease without dyskinesia, with fluctuations: Secondary | ICD-10-CM | POA: Diagnosis not present

## 2024-10-16 DIAGNOSIS — N1832 Chronic kidney disease, stage 3b: Secondary | ICD-10-CM | POA: Diagnosis not present

## 2024-10-16 DIAGNOSIS — I10 Essential (primary) hypertension: Secondary | ICD-10-CM | POA: Diagnosis not present

## 2024-10-16 DIAGNOSIS — G629 Polyneuropathy, unspecified: Secondary | ICD-10-CM | POA: Diagnosis not present

## 2024-10-16 DIAGNOSIS — Z853 Personal history of malignant neoplasm of breast: Secondary | ICD-10-CM | POA: Diagnosis not present

## 2024-10-16 DIAGNOSIS — E782 Mixed hyperlipidemia: Secondary | ICD-10-CM | POA: Diagnosis not present

## 2024-10-16 NOTE — Progress Notes (Addendum)
 "  Established Patient Office Visit  Subjective:  Patient ID: Yvonne Singleton, female    DOB: [birth year]  Age: 89 y.o. MRN: [id number]  Chief Complaint  Patient presents with   Follow-up    Patient comes in for her follow-up today.  She is in good spirits and says she is feeling very well.  She had a good Christmas with family.  Denies any chest pain, no shortness of breath, no palpitations.  She has completed her antibiotic for UTI, and does not have any further complaints of dysuria or burning micturition.  She is still getting physical therapy.  Also her pressure wound has healed nicely. Patient has history of  Right Breast cancer s/p Lumpectomy and radiation in 2007 - uses mastectomy /shaper bras since then. Will send in order.    No other concerns at this time.   Past Medical History:  Diagnosis Date   Acute metabolic encephalopathy 05/12/2022   Aortic atherosclerosis    Back pain    Breast cancer, right (HCC) 2007   a.) Tx'd with partial mastectomy (lumpectomy) + adjuvant XRT   Cerebral microvascular disease    CKD (chronic kidney disease) stage 3, GFR 30-59 ml/min (HCC)    Collagen vascular disease    Depression    Diastolic dysfunction    Fatty liver    GERD (gastroesophageal reflux disease)    Glaucoma    Gout    Hard of hearing    Headache 05/16/2022   History of bilateral hip replacements    History of hiatal hernia    Hyperlipidemia    Hypertension    Lives in assisted living facility    [facility]   Macular degeneration    Parkinson's disease [facility])    Peripheral neuropathy    RA (rheumatoid arthritis) (HCC)    Recurrent dislocation of hip    Spinal stenosis of lumbar region    Ventral hernia    Vitamin B 12 deficiency     Past Surgical History:  Procedure Laterality Date   ABDOMINAL HYSTERECTOMY     ANTERIOR HIP REVISION Right 04/08/2022   Procedure: Anterior hip revision cup and femoral head;  Surgeon: Kathlynn Sharper, MD;  Location: [facility] ORS;   Service: Orthopedics;  Laterality: Right;   BREAST LUMPECTOMY Right 2007   suspicious calcs, f/u with radiation   CATARACT EXTRACTION Bilateral    ESOPHAGOGASTRODUODENOSCOPY (EGD) WITH PROPOFOL  N/A 05/25/2017   Procedure: ESOPHAGOGASTRODUODENOSCOPY (EGD) WITH PROPOFOL ;  Surgeon: Gaylyn Gladis PENNER, MD;  Location: [facility];  Service: Endoscopy;  Laterality: N/A;   ESOPHAGOGASTRODUODENOSCOPY (EGD) WITH PROPOFOL  N/A 05/09/2018   Procedure: ESOPHAGOGASTRODUODENOSCOPY (EGD) WITH PROPOFOL ;  Surgeon: Unk Corinn Skiff, MD;  Location: [facility] ENDOSCOPY;  Service: Gastroenterology;  Laterality: N/A;   ESOPHAGOGASTRODUODENOSCOPY (EGD) WITH PROPOFOL  N/A 12/09/2021   Procedure: ESOPHAGOGASTRODUODENOSCOPY (EGD) WITH PROPOFOL ;  Surgeon: Unk Corinn Skiff, MD;  Location: [facility] ENDOSCOPY;  Service: Gastroenterology;  Laterality: N/A;   EYE SURGERY Bilateral    HAND SURGERY     HIP CLOSED REDUCTION Left 06/24/2015   Procedure: CLOSED REDUCTION HIP;  Surgeon: Lynwood SHAUNNA Hue, MD;  Location: [facility] ORS;  Service: Orthopedics;  Laterality: Left;   HIP CLOSED REDUCTION Left 02/09/2016   Procedure: CLOSED MANIPULATION HIP;  Surgeon: Kayla Pinal, MD;  Location: [facility] OR[facility];  Service: Orthopedics;  Laterality: Left;   HIP CLOSED REDUCTION Left 10/31/2021   Procedure: CLOSED REDUCTION HIP;  Surgeon: Edie Norleen PARAS, MD;  Location: [facility] ORS;  Service: Orthopedics;  Laterality: Left;   HIP  CLOSED REDUCTION Left 07/05/2022   Procedure: CLOSED REDUCTION HIP;  Surgeon: Mardee Lynwood SQUIBB, MD;  Location: [facility] ORS;  Service: Orthopedics;  Laterality: Left;   HIP CLOSED REDUCTION Left 01/17/2023   Procedure: CLOSED REDUCTION HIP;  Surgeon: Lorelle Hussar, MD;  Location: [facility] OR[facility];  Service: Orthopedics;  Laterality: Left;   HIP CLOSED REDUCTION Left 05/17/2023   Procedure: CLOSED REDUCTION HIP;  Surgeon: Edie Norleen PARAS, MD;  Location: [facility] OR[facility];  Service: Orthopedics;  Laterality: Left;   HIP CLOSED REDUCTION Left 03/07/2024   Procedure:  CLOSED REDUCTION, HIP;  Surgeon: Tobie Priest, MD;  Location: [facility] ORS;  Service: Orthopedics;  Laterality: Left;   HIP SURGERY     JOINT REPLACEMENT     bilateral hip   OOPHORECTOMY     TOTAL HIP ARTHROPLASTY WITH HARDWARE REMOVAL Left 05/30/2024   Procedure: REVISION, ARTHROPLASTY, HIP;  Surgeon: Mardee Lynwood SQUIBB, MD;  Location: [facility] OR[facility];  Service: Orthopedics;  Laterality: Left;   TOTAL KNEE ARTHROPLASTY Bilateral     Social History   Socioeconomic History   Marital status: Widowed    Spouse name: Not on file   Number of children: Not on file   Years of education: Not on file   Highest education level: Not on file  Occupational History   Not on file  Tobacco Use   Smoking status: Never    Passive exposure: Never   Smokeless tobacco: Never  Vaping Use   Vaping status: Never Used  Substance and Sexual Activity   Alcohol use: No    Alcohol/week: 0.0 standard drinks of alcohol   Drug use: No   Sexual activity: Not Currently  Other Topics Concern   Not on file  Social History Narrative   Not on file   Social Drivers of Health   Tobacco Use: Low Risk (10/16/2024)   Patient History    Smoking Tobacco Use: Never    Smokeless Tobacco Use: Never    Passive Exposure: Never  Financial Resource Strain: Low Risk  (09/06/2024)   Received from [facility]   Overall Financial Resource Strain (CARDIA)    Difficulty of Paying Living Expenses: Not very hard  Food Insecurity: No Food Insecurity (09/06/2024)   Received from [facility]   Epic    Within the past 12 months, you worried that your food would run out before you got the money to buy more.: Never true    Within the past 12 months, the food you bought just didn't last and you didn't have money to get more.: Never true  Transportation Needs: No Transportation Needs (09/06/2024)   Received from [facility] - Transportation    In the past 12 months, has lack of  transportation kept you from medical appointments or from getting medications?: No    Lack of Transportation (Non-Medical): No  Physical Activity: Not on file  Stress: Not on file  Social Connections: Unknown (03/07/2024)   Social Connection and Isolation Panel    Frequency of Communication with Friends and Family: More than three times a week    Frequency of Social Gatherings with Friends and Family: Once a week    Attends Religious Services: 1 to 4 times per year    Active Member of [employer] or Organizations: No    Attends Banker Meetings: Not on file    Marital Status: Not on file  Intimate Partner Violence: Not At Risk (05/30/2024)   Epic  Fear of Current or Ex-Partner: No    Emotionally Abused: No    Physically Abused: No    Sexually Abused: No  Depression (PHQ2-9): Low Risk (12/23/2023)   Depression (PHQ2-9)    PHQ-2 Score: 4  Alcohol Screen: Not on file  Housing: Low Risk  (09/06/2024)   Received from [facility]   Epic    In the last 12 months, was there a time when you were not able to pay the mortgage or rent on time?: No    In the past 12 months, how many times have you moved where you were living?: 0    At any time in the past 12 months, were you homeless or living in a shelter (including now)?: No  Utilities: Not At Risk (09/06/2024)   Received from [facility]   Epic    In the past 12 months has the electric, gas, oil, or water company threatened to shut off services in your home?: No  Health Literacy: Not on file    Family History  Problem Relation Age of Onset   Heart disease Mother    Arthritis Mother    Heart disease Father    Ulcers Father    Breast cancer Maternal Aunt     Allergies[1]  Show/hide medication list[2]  Review of Systems  Constitutional: Negative.  Negative for chills, fever and malaise/fatigue.  HENT: Negative.  Negative for congestion and sore throat.   Eyes: Negative.  Negative for  blurred vision and pain.  Respiratory: Negative.  Negative for cough and shortness of breath.   Cardiovascular: Negative.  Negative for chest pain, palpitations and leg swelling.  Gastrointestinal: Negative.  Negative for abdominal pain, blood in stool, constipation, diarrhea, heartburn, melena, nausea and vomiting.  Genitourinary: Negative.  Negative for dysuria, flank pain, frequency and urgency.  Musculoskeletal: Negative.  Negative for joint pain and myalgias.  Skin: Negative.   Neurological: Negative.  Negative for dizziness, tingling, sensory change, weakness and headaches.  Endo/Heme/Allergies: Negative.   Psychiatric/Behavioral: Negative.  Negative for depression and suicidal ideas. The patient is not nervous/anxious.        Objective:   BP 112/68   Pulse 64   Ht 5' 3.5 (1.613 m)   SpO2 99%   BMI 31.02 kg/m   Vitals:   10/16/24 1303  BP: 112/68  Pulse: 64  Height: 5' 3.5 (1.613 m)  Weight: Comment: pt unable to weigh  SpO2: 99%    Physical Exam Vitals and nursing note reviewed.  Constitutional:      Appearance: Normal appearance.  HENT:     Head: Normocephalic and atraumatic.     Nose: Nose normal.     Mouth/Throat:     Mouth: Mucous membranes are moist.     Pharynx: Oropharynx is clear.  Eyes:     Conjunctiva/sclera: Conjunctivae normal.     Pupils: Pupils are equal, round, and reactive to light.  Cardiovascular:     Rate and Rhythm: Normal rate and regular rhythm.     Pulses: Normal pulses.     Heart sounds: Normal heart sounds. No murmur heard. Pulmonary:     Effort: Pulmonary effort is normal.     Breath sounds: Normal breath sounds. No wheezing.  Abdominal:     General: Bowel sounds are normal.     Palpations: Abdomen is soft.     Tenderness: There is no abdominal tenderness. There is no right CVA tenderness or left CVA tenderness.  Musculoskeletal:  General: Normal range of motion.     Cervical back: Normal range of motion.     Right  lower leg: No edema.     Left lower leg: No edema.  Skin:    General: Skin is warm and dry.  Neurological:     General: No focal deficit present.     Mental Status: She is alert and oriented to person, place, and time.  Psychiatric:        Mood and Affect: Mood normal.        Behavior: Behavior normal.      No results found for any visits on 10/16/24.  Recent Results (from the past 2160 hours)  Resp panel by RT-PCR (RSV, Flu A&B, Covid) Anterior Nasal Swab     Status: None   Collection Time: 08/24/24  9:11 AM   Specimen: Anterior Nasal Swab  Result Value Ref Range   SARS Coronavirus 2 by RT PCR NEGATIVE NEGATIVE    Comment: (NOTE) SARS-CoV-2 target nucleic acids are NOT DETECTED.  The SARS-CoV-2 RNA is generally detectable in upper respiratory specimens during the acute phase of infection. The lowest concentration of SARS-CoV-2 viral copies this assay can detect is 138 copies/mL. A negative result does not preclude SARS-Cov-2 infection and should not be used as the sole basis for treatment or other patient management decisions. A negative result may occur with  improper specimen collection/handling, submission of specimen other than nasopharyngeal swab, presence of viral mutation(s) within the areas targeted by this assay, and inadequate number of viral copies(<138 copies/mL). A negative result must be combined with clinical observations, patient history, and epidemiological information. The expected result is Negative.  Fact Sheet for Patients:  bloggercourse.com  Fact Sheet for Healthcare Providers:  seriousbroker.it  This test is no t yet approved or cleared by the [place]  FDA and  has been authorized for detection and/or diagnosis of SARS-CoV-2 by FDA under an Emergency Use Authorization (EUA). This EUA will remain  in effect (meaning this test can be used) for the duration of the COVID-19 declaration under  Section 564(b)(1) of the Act, 21 U.S.C.section 360bbb-3(b)(1), unless the authorization is terminated  or revoked sooner.       Influenza A by PCR NEGATIVE NEGATIVE   Influenza B by PCR NEGATIVE NEGATIVE    Comment: (NOTE) The Xpert Xpress SARS-CoV-2/FLU/RSV plus assay is intended as an aid in the diagnosis of influenza from Nasopharyngeal swab specimens and should not be used as a sole basis for treatment. Nasal washings and aspirates are unacceptable for Xpert Xpress SARS-CoV-2/FLU/RSV testing.  Fact Sheet for Patients: bloggercourse.com  Fact Sheet for Healthcare Providers: seriousbroker.it  This test is not yet approved or cleared by the [place]  FDA and has been authorized for detection and/or diagnosis of SARS-CoV-2 by FDA under an Emergency Use Authorization (EUA). This EUA will remain in effect (meaning this test can be used) for the duration of the COVID-19 declaration under Section 564(b)(1) of the Act, 21 U.S.C. section 360bbb-3(b)(1), unless the authorization is terminated or revoked.     Resp Syncytial Virus by PCR NEGATIVE NEGATIVE    Comment: (NOTE) Fact Sheet for Patients: bloggercourse.com  Fact Sheet for Healthcare Providers: seriousbroker.it  This test is not yet approved or cleared by the [place]  FDA and has been authorized for detection and/or diagnosis of SARS-CoV-2 by FDA under an Emergency Use Authorization (EUA). This EUA will remain in effect (meaning this test can be used) for the duration of the COVID-19 declaration  under Section 564(b)(1) of the Act, 21 U.S.C. section 360bbb-3(b)(1), unless the authorization is terminated or revoked.  Performed at [facility], [address] [place], [place] [address]   Comprehensive metabolic panel with GFR     Status: Abnormal   Collection Time: 08/24/24  9:11 AM  Result Value Ref  Range   Sodium 140 135 - 145 mmol/L   Potassium 5.8 (H) 3.5 - 5.1 mmol/L   Chloride 105 98 - 111 mmol/L   CO2 24 22 - 32 mmol/L   Glucose, Bld 93 70 - 99 mg/dL    Comment: Glucose reference range applies only to samples taken after fasting for at least 8 hours.   BUN 28 (H) 8 - 23 mg/dL   Creatinine, Ser 8.70 (H) 0.44 - 1.00 mg/dL   Calcium  9.1 8.9 - 10.3 mg/dL   Total Protein 7.2 6.5 - 8.1 g/dL   Albumin 3.7 3.5 - 5.0 g/dL   AST 14 (L) 15 - 41 U/L   ALT <5 0 - 44 U/L   Alkaline Phosphatase 75 38 - 126 U/L   Total Bilirubin 0.5 0.0 - 1.2 mg/dL   GFR, Estimated 40 (L) >60 mL/min    Comment: (NOTE) Calculated using the CKD-EPI Creatinine Equation (2021)    Anion gap 11 5 - 15    Comment: Performed at [facility], [address] [place], [place] [address]  CBC with Differential/Platelet     Status: None   Collection Time: 08/24/24  9:11 AM  Result Value Ref Range   WBC 6.3 4.0 - 10.5 K/uL   RBC 4.10 3.87 - 5.11 MIL/uL   Hemoglobin 12.3 12.0 - 15.0 g/dL   HCT 59.6 63.9 - 53.9 %   MCV 98.3 80.0 - 100.0 fL   MCH 30.0 26.0 - 34.0 pg   MCHC 30.5 30.0 - 36.0 g/dL   RDW 85.7 88.4 - 84.4 %   Platelets 319 150 - 400 K/uL   nRBC 0.0 0.0 - 0.2 %   Neutrophils Relative % 64 %   Neutro Abs 4.1 1.7 - 7.7 K/uL   Lymphocytes Relative 23 %   Lymphs Abs 1.4 0.7 - 4.0 K/uL   Monocytes Relative 6 %   Monocytes Absolute 0.4 0.1 - 1.0 K/uL   Eosinophils Relative 5 %   Eosinophils Absolute 0.3 0.0 - 0.5 K/uL   Basophils Relative 1 %   Basophils Absolute 0.1 0.0 - 0.1 K/uL   Immature Granulocytes 1 %   Abs Immature Granulocytes 0.04 0.00 - 0.07 K/uL    Comment: Performed at [facility], [address] [place], [place] [address]  Troponin I (High Sensitivity)     Status: None   Collection Time: 08/24/24  9:11 AM  Result Value Ref Range   Troponin I (High Sensitivity) 7 <18 ng/L    Comment: (NOTE) Elevated high sensitivity troponin I (hsTnI) values and significant   changes across serial measurements may suggest ACS but many other  chronic and acute conditions are known to elevate hsTnI results.  Refer to the Links section for chest pain algorithms and additional  guidance. Performed at [facility], [address] [place], [place] [address]   Magnesium      Status: None   Collection Time: 08/24/24  9:11 AM  Result Value Ref Range   Magnesium  2.2 1.7 - 2.4 mg/dL    Comment: Performed at [facility], [address] [place], [place] [address]  Urinalysis, Routine w reflex  microscopic -Urine, Clean Catch     Status: Abnormal   Collection Time: 08/24/24  9:50 AM  Result Value Ref Range   Color, Urine YELLOW (A) YELLOW   APPearance HAZY (A) CLEAR   Specific Gravity, Urine 1.008 1.005 - 1.030   pH 7.0 5.0 - 8.0   Glucose, UA NEGATIVE NEGATIVE mg/dL   Hgb urine dipstick SMALL (A) NEGATIVE   Bilirubin Urine NEGATIVE NEGATIVE   Ketones, ur NEGATIVE NEGATIVE mg/dL   Protein, ur NEGATIVE NEGATIVE mg/dL   Nitrite POSITIVE (A) NEGATIVE   Leukocytes,Ua TRACE (A) NEGATIVE   RBC / HPF 0-5 0 - 5 RBC/hpf   WBC, UA 6-10 0 - 5 WBC/hpf   Bacteria, UA MANY (A) NONE SEEN   Squamous Epithelial / HPF 0-5 0 - 5 /HPF   Mucus PRESENT     Comment: Performed at [facility], [address], [place], [place] [address]  Urine Culture     Status: Abnormal   Collection Time: 08/24/24  9:50 AM   Specimen: Urine, Clean Catch  Result Value Ref Range   Specimen Description      URINE, CLEAN CATCH Performed at [facility], [address], [place], [place] [address]    Special Requests      NONE Performed at [facility], [address] [place], [place] [address]    Culture (A)     >=100,000 COLONIES/mL ESCHERICHIA COLI Two isolates with different morphologies were identified as the same organism.The most resistant organism was reported. 50,000 COLONIES/mL PSEUDOMONAS AERUGINOSA    Report Status 08/27/2024 FINAL     Organism ID, Bacteria PSEUDOMONAS AERUGINOSA (A)    Organism ID, Bacteria ESCHERICHIA COLI (A)       Susceptibility   Escherichia coli - MIC*    AMPICILLIN >=32 RESISTANT Resistant     CEFAZOLIN  (URINE) Value in next row Sensitive      8 SENSITIVEThis is a modified FDA-approved test that has been validated and its performance characteristics determined by the reporting laboratory.  This laboratory is certified under the Clinical Laboratory Improvement Amendments CLIA as qualified to perform high complexity clinical laboratory testing.    CEFEPIME Value in next row Sensitive      8 SENSITIVEThis is a modified FDA-approved test that has been validated and its performance characteristics determined by the reporting laboratory.  This laboratory is certified under the Clinical Laboratory Improvement Amendments CLIA as qualified to perform high complexity clinical laboratory testing.    ERTAPENEM Value in next row Sensitive      8 SENSITIVEThis is a modified FDA-approved test that has been validated and its performance characteristics determined by the reporting laboratory.  This laboratory is certified under the Clinical Laboratory Improvement Amendments CLIA as qualified to perform high complexity clinical laboratory testing.    CEFTRIAXONE  Value in next row Sensitive      8 SENSITIVEThis is a modified FDA-approved test that has been validated and its performance characteristics determined by the reporting laboratory.  This laboratory is certified under the Clinical Laboratory Improvement Amendments CLIA as qualified to perform high complexity clinical laboratory testing.    CIPROFLOXACIN  Value in next row Resistant      8 SENSITIVEThis is a modified FDA-approved test that has been validated and its performance characteristics determined by the reporting laboratory.  This laboratory is certified under the Clinical Laboratory Improvement Amendments CLIA as qualified to perform high complexity clinical  laboratory testing.    GENTAMICIN Value in next row Sensitive  8 SENSITIVEThis is a modified FDA-approved test that has been validated and its performance characteristics determined by the reporting laboratory.  This laboratory is certified under the Clinical Laboratory Improvement Amendments CLIA as qualified to perform high complexity clinical laboratory testing.    NITROFURANTOIN Value in next row Sensitive      8 SENSITIVEThis is a modified FDA-approved test that has been validated and its performance characteristics determined by the reporting laboratory.  This laboratory is certified under the Clinical Laboratory Improvement Amendments CLIA as qualified to perform high complexity clinical laboratory testing.    TRIMETH/SULFA Value in next row Resistant      8 SENSITIVEThis is a modified FDA-approved test that has been validated and its performance characteristics determined by the reporting laboratory.  This laboratory is certified under the Clinical Laboratory Improvement Amendments CLIA as qualified to perform high complexity clinical laboratory testing.    AMPICILLIN/SULBACTAM Value in next row Sensitive      8 SENSITIVEThis is a modified FDA-approved test that has been validated and its performance characteristics determined by the reporting laboratory.  This laboratory is certified under the Clinical Laboratory Improvement Amendments CLIA as qualified to perform high complexity clinical laboratory testing.    PIP/TAZO Value in next row Sensitive      <=4 SENSITIVEThis is a modified FDA-approved test that has been validated and its performance characteristics determined by the reporting laboratory.  This laboratory is certified under the Clinical Laboratory Improvement Amendments CLIA as qualified to perform high complexity clinical laboratory testing.    MEROPENEM Value in next row Sensitive      <=4 SENSITIVEThis is a modified FDA-approved test that has been validated and its performance  characteristics determined by the reporting laboratory.  This laboratory is certified under the Clinical Laboratory Improvement Amendments CLIA as qualified to perform high complexity clinical laboratory testing.    * >=100,000 COLONIES/mL ESCHERICHIA COLI   Pseudomonas aeruginosa - MIC*    MEROPENEM Value in next row Sensitive      <=4 SENSITIVEThis is a modified FDA-approved test that has been validated and its performance characteristics determined by the reporting laboratory.  This laboratory is certified under the Clinical Laboratory Improvement Amendments CLIA as qualified to perform high complexity clinical laboratory testing.    CIPROFLOXACIN  Value in next row Resistant      <=4 SENSITIVEThis is a modified FDA-approved test that has been validated and its performance characteristics determined by the reporting laboratory.  This laboratory is certified under the Clinical Laboratory Improvement Amendments CLIA as qualified to perform high complexity clinical laboratory testing.    IMIPENEM Value in next row Sensitive      <=4 SENSITIVEThis is a modified FDA-approved test that has been validated and its performance characteristics determined by the reporting laboratory.  This laboratory is certified under the Clinical Laboratory Improvement Amendments CLIA as qualified to perform high complexity clinical laboratory testing.    PIP/TAZO Value in next row Sensitive      8 SENSITIVEThis is a modified FDA-approved test that has been validated and its performance characteristics determined by the reporting laboratory.  This laboratory is certified under the Clinical Laboratory Improvement Amendments CLIA as qualified to perform high complexity clinical laboratory testing.    CEFEPIME Value in next row Sensitive      8 SENSITIVEThis is a modified FDA-approved test that has been validated and its performance characteristics determined by the reporting laboratory.  This laboratory is certified under the  Clinical Laboratory Improvement Amendments CLIA as qualified  to perform high complexity clinical laboratory testing.    CEFTAZIDIME/AVIBACTAM Value in next row Sensitive      8 SENSITIVEThis is a modified FDA-approved test that has been validated and its performance characteristics determined by the reporting laboratory.  This laboratory is certified under the Clinical Laboratory Improvement Amendments CLIA as qualified to perform high complexity clinical laboratory testing.    CEFTOLOZANE/TAZOBACTAM Value in next row Sensitive      8 SENSITIVEThis is a modified FDA-approved test that has been validated and its performance characteristics determined by the reporting laboratory.  This laboratory is certified under the Clinical Laboratory Improvement Amendments CLIA as qualified to perform high complexity clinical laboratory testing.    TOBRAMYCIN Value in next row Sensitive      8 SENSITIVEThis is a modified FDA-approved test that has been validated and its performance characteristics determined by the reporting laboratory.  This laboratory is certified under the Clinical Laboratory Improvement Amendments CLIA as qualified to perform high complexity clinical laboratory testing.    * 50,000 COLONIES/mL PSEUDOMONAS AERUGINOSA  Troponin I (High Sensitivity)     Status: None   Collection Time: 08/24/24 11:32 AM  Result Value Ref Range   Troponin I (High Sensitivity) 7 <18 ng/L    Comment: (NOTE) Elevated high sensitivity troponin I (hsTnI) values and significant  changes across serial measurements may suggest ACS but many other  chronic and acute conditions are known to elevate hsTnI results.  Refer to the Links section for chest pain algorithms and additional  guidance. Performed at [facility], [address] [place], [place] [address]   Basic metabolic panel     Status: Abnormal   Collection Time: 08/24/24  1:06 PM  Result Value Ref Range   Sodium 138 135 - 145 mmol/L   Potassium  5.6 (H) 3.5 - 5.1 mmol/L   Chloride 102 98 - 111 mmol/L   CO2 25 22 - 32 mmol/L   Glucose, Bld 87 70 - 99 mg/dL    Comment: Glucose reference range applies only to samples taken after fasting for at least 8 hours.   BUN 26 (H) 8 - 23 mg/dL   Creatinine, Ser 8.66 (H) 0.44 - 1.00 mg/dL   Calcium  8.9 8.9 - 10.3 mg/dL   GFR, Estimated 39 (L) >60 mL/min    Comment: (NOTE) Calculated using the CKD-EPI Creatinine Equation (2021)    Anion gap 11 5 - 15    Comment: Performed at [facility], [address] [place], [place] [address]  POCT Urinalysis Dipstick [phone])     Status: Abnormal   Collection Time: 09/03/24 11:48 AM  Result Value Ref Range   Color, UA Light yellow    Clarity, UA Cloudy    Glucose, UA Negative Negative   Bilirubin, UA Negative    Ketones, UA Negative    Spec Grav, UA 1.015 1.010 - 1.025   Blood, UA Negative    pH, UA 5.5 5.0 - 8.0   Protein, UA Negative Negative   Urobilinogen, UA 0.2 0.2 or 1.0 E.U./dL   Nitrite, UA Positive    Leukocytes, UA Trace (A) Negative   Appearance Cloudy    Odor Yes   Basic metabolic panel with GFR     Status: Abnormal   Collection Time: 09/03/24 12:23 PM  Result Value Ref Range   Glucose 87 70 - 99 mg/dL   BUN 40 (H) 8 - 27 mg/dL   Creatinine, Ser 8.59 (H) 0.57 - 1.00 mg/dL  eGFR 36 (L) >59 mL/min/1.73   BUN/Creatinine Ratio 29 (H) 12 - 28   Sodium 138 134 - 144 mmol/L   Potassium 4.7 3.5 - 5.2 mmol/L   Chloride 102 96 - 106 mmol/L   CO2 22 20 - 29 mmol/L   Calcium  9.9 8.7 - 10.3 mg/dL  Urine Culture     Status: Abnormal   Collection Time: 09/03/24  3:56 PM   Specimen: Urine   UR  Result Value Ref Range   Urine Culture, Routine Final report (A)    Organism ID, Bacteria Escherichia coli (A)     Comment: Cefazolin  with an MIC <=16 predicts susceptibility to the oral agents cefaclor, cefdinir, cefpodoxime , cefprozil, cefuroxime , cephalexin, and loracarbef when used for therapy of uncomplicated urinary  tract infections due to E. coli, Klebsiella pneumoniae, and Proteus mirabilis. Multi-Drug Resistant Organism Greater than 100,000 colony forming units per mL    ORGANISM ID, BACTERIA Comment (A)     Comment: Pseudomonas aeruginosa Ceftazidime-avibactam and ceftolozane-tazobactam may be considered for therapy ONLY when multi-drug resistance (MDR) is demonstrated to meropenem and other tested agents. 10,000-25,000 colony forming units per mL    Antimicrobial Susceptibility Comment     Comment:       ** S = Susceptible; I = Intermediate; R = Resistant **                    P = Positive; N = Negative             MICS are expressed in micrograms per mL    Antibiotic                 RSLT#1    RSLT#2    RSLT#3    RSLT#4 Amikacin                                 S Amoxicillin/Clavulanic Acid    S Ampicillin                     R Cefazolin                       S Cefepime                       S         S Cefoxitin                      S Cefpodoxime                     S Ceftazidime                              S Ceftazidime/avibactam                    S Ceftolozane/tazobactam                   S Ceftriaxone                     S Ciprofloxacin                   R         S Ertapenem  S Gentamicin                     S Levofloxacin                   R         S Meropenem                      S         S Nitrofurantoin                 S Piperacillin/Tazobactam        S         S Tetracycline                   R Tobramycin                     S          S Trimethoprim /Sulfa             R       Assessment & Plan:  Patient advised to continue her medications.  Encourage p.o. fluids.  She has an appointment to see her nephrologist in 2 months. Mastectomy/shaper bra ordered. Problem List Items Addressed This Visit       Cardiovascular and Mediastinum   Hypertension - Primary     Nervous and Auditory   Parkinson disease (HCC)   Peripheral neuropathy      Genitourinary   Stage 3b chronic kidney disease (HCC)     Other   Hyperlipidemia   Personal history of breast cancer    Return in about 3 months (around 01/14/2025).   Total time spent: 30 minutes. This time includes review of previous notes and results and patient face to face interaction during today's visit.    FERNAND FREDY RAMAN, MD  10/16/2024   This document may have been prepared by [facility] Voice Recognition software and as such may include unintentional dictation errors.      [1]  Allergies Allergen Reactions   Streptomycin Hives   Digoxin Other (See Comments)    Other reaction(s): Unknown Note: pt had lethargy, felt bad Note: pt had lethargy, felt bad  Other reaction(s): Unknown Note: pt had lethargy, felt bad  Other reaction(s): Unknown Note: pt had lethargy, felt bad Note: pt had lethargy, felt bad   Trospium Nausea And Vomiting   Citric Acid Other (See Comments)    Unknown reaction per patient   Iodinated Contrast Media Hives  [2]  Outpatient Medications Prior to Visit  Medication Sig   acetaminophen  (TYLENOL ) 500 MG tablet Take 500 mg by mouth every 8 (eight) hours as needed (headache (Max 4000 mg/day)).   Alpha-Lipoic Acid 600 MG TABS Take 600 mg by mouth in the morning.   aluminum -petrolatum -zinc  (1-2-3 PASTE) 0.027-13.7-10% PSTE paste Apply 1 Application topically as needed.   aspirin  81 MG chewable tablet Chew 1 tablet (81 mg total) by mouth 2 (two) times daily.   Calcium  Carbonate-Vitamin D  600-200 MG-UNIT TABS Take 1 tablet by mouth 2 (two) times daily.   carbidopa -levodopa  (SINEMET  CR) 50-200 MG per tablet Take 1 tablet by mouth 3 (three) times daily.   celecoxib  (CELEBREX ) 200 MG capsule Take 1 capsule (200 mg total) by mouth 2 (two) times daily.   cyanocobalamin  (VITAMIN B12) 1000 MCG tablet Take 1,000 mcg by mouth daily.   furosemide  (LASIX ) 20 MG tablet Take 1 tablet (20  mg total) by mouth daily.   gabapentin  (NEURONTIN ) 300 MG capsule Take 300 mg  by mouth 3 (three) times daily.   latanoprost  (XALATAN ) 0.005 % ophthalmic solution Place 1 drop into both eyes at bedtime.   liver oil-zinc  oxide (DESITIN) 40 % ointment Apply 1 Application topically as needed for irritation.   ondansetron  (ZOFRAN ) 4 MG tablet Take 1 tablet (4 mg total) by mouth every 6 (six) hours as needed for nausea.   pantoprazole  (PROTONIX ) 40 MG tablet Take 1 tablet (40 mg total) by mouth 2 (two) times daily before a meal.   sertraline  (ZOLOFT ) 100 MG tablet Take 100 mg by mouth in the morning.   trimethoprim  (TRIMPEX ) 100 MG tablet Take 1 tablet (100 mg total) by mouth daily.   Wound Dressings (ALLEVYN GENTLE BORDER SACRUM) PADS Apply 1 sacrum pressure dressing to sacrum area daily and replace as needed if it becomes soiled with urine or stool.   chlorhexidine  (HIBICLENS ) 4 % external liquid Apply 15 mLs (1 Application total) topically as directed for 30 doses. Use as directed daily for 5 days every other week for 6 weeks. (Patient not taking: Reported on 10/16/2024)   oxyCODONE  (OXY IR/ROXICODONE ) 5 MG immediate release tablet Take 1 tablet (5 mg total) by mouth every 4 (four) hours as needed for moderate pain (pain score 4-6) (pain score 4-6). (Patient not taking: Reported on 10/16/2024)   traMADol  (ULTRAM ) 50 MG tablet Take 1-2 tablets (50-100 mg total) by mouth every 4 (four) hours as needed for moderate pain (pain score 4-6). (Patient not taking: Reported on 10/16/2024)   No facility-administered medications prior to visit.   "

## 2024-10-29 ENCOUNTER — Telehealth: Payer: Self-pay | Admitting: Internal Medicine

## 2024-10-29 NOTE — Telephone Encounter (Signed)
 Patient left VM stating she believes she has a sinus infection and is requesting something be sent in to her pharmacy for her.

## 2024-11-01 NOTE — Telephone Encounter (Signed)
 Yvonne Singleton Spoke w/patient; she has had a headache, swelling around her nose and nasal congestion. Her symptoms started 3 days ago. She has does not have a fever or cough.

## 2024-11-06 ENCOUNTER — Other Ambulatory Visit: Payer: Self-pay | Admitting: Internal Medicine

## 2024-11-06 DIAGNOSIS — Z1231 Encounter for screening mammogram for malignant neoplasm of breast: Secondary | ICD-10-CM

## 2024-11-07 ENCOUNTER — Telehealth: Payer: Self-pay

## 2024-11-14 ENCOUNTER — Telehealth: Payer: Self-pay

## 2024-11-21 NOTE — Telephone Encounter (Signed)
 Pt wanted to confirm appt for next week.

## 2024-11-27 ENCOUNTER — Ambulatory Visit: Admitting: Internal Medicine

## 2024-12-03 ENCOUNTER — Ambulatory Visit: Admitting: Urology

## 2024-12-05 ENCOUNTER — Ambulatory Visit

## 2025-01-31 ENCOUNTER — Ambulatory Visit: Admitting: Internal Medicine
